# Patient Record
Sex: Female | Born: 1950
Health system: Southern US, Community
[De-identification: ages and names within clinical notes are randomized; demographics above are authoritative.]

## PROBLEM LIST (undated history)

## (undated) DIAGNOSIS — T7840XA Allergy, unspecified, initial encounter: Secondary | ICD-10-CM

## (undated) DIAGNOSIS — K219 Gastro-esophageal reflux disease without esophagitis: Secondary | ICD-10-CM

## (undated) DIAGNOSIS — F419 Anxiety disorder, unspecified: Secondary | ICD-10-CM

## (undated) DIAGNOSIS — G4730 Sleep apnea, unspecified: Secondary | ICD-10-CM

## (undated) DIAGNOSIS — M949 Disorder of cartilage, unspecified: Secondary | ICD-10-CM

## (undated) DIAGNOSIS — N318 Other neuromuscular dysfunction of bladder: Secondary | ICD-10-CM

## (undated) DIAGNOSIS — J45901 Unspecified asthma with (acute) exacerbation: Secondary | ICD-10-CM

## (undated) DIAGNOSIS — I10 Essential (primary) hypertension: Secondary | ICD-10-CM

## (undated) DIAGNOSIS — F319 Bipolar disorder, unspecified: Secondary | ICD-10-CM

## (undated) DIAGNOSIS — E785 Hyperlipidemia, unspecified: Secondary | ICD-10-CM

## (undated) DIAGNOSIS — M1990 Unspecified osteoarthritis, unspecified site: Secondary | ICD-10-CM

## (undated) DIAGNOSIS — D649 Anemia, unspecified: Secondary | ICD-10-CM

## (undated) DIAGNOSIS — M899 Disorder of bone, unspecified: Secondary | ICD-10-CM

## (undated) DIAGNOSIS — I509 Heart failure, unspecified: Secondary | ICD-10-CM

## (undated) DIAGNOSIS — G43009 Migraine without aura, not intractable, without status migrainosus: Secondary | ICD-10-CM

## (undated) HISTORY — PX: HERNIA REPAIR: SHX51

## (undated) HISTORY — PX: CARPAL TUNNEL RELEASE: SHX101

## (undated) HISTORY — PX: ABDOMINAL HYSTERECTOMY: SHX81

## (undated) HISTORY — DX: Migraine without aura, not intractable, without status migrainosus: G43.009

## (undated) HISTORY — DX: Unspecified osteoarthritis, unspecified site: M19.90

## (undated) HISTORY — DX: Anxiety disorder, unspecified: F41.9

## (undated) HISTORY — PX: EYE SURGERY: SHX253

## (undated) HISTORY — DX: Anemia, unspecified: D64.9

## (undated) HISTORY — DX: Disorder of bone, unspecified: M89.9

## (undated) HISTORY — DX: Gastro-esophageal reflux disease without esophagitis: K21.9

## (undated) HISTORY — DX: Bipolar disorder, unspecified: F31.9

## (undated) HISTORY — PX: CHOLECYSTECTOMY: SHX55

## (undated) HISTORY — DX: Disorder of cartilage, unspecified: M94.9

## (undated) HISTORY — PX: JOINT REPLACEMENT: SHX530

## (undated) HISTORY — DX: Hyperlipidemia, unspecified: E78.5

## (undated) HISTORY — DX: Unspecified asthma with (acute) exacerbation: J45.901

## (undated) HISTORY — PX: TUBAL LIGATION: SHX77

## (undated) HISTORY — DX: Other neuromuscular dysfunction of bladder: N31.8

## (undated) HISTORY — PX: COSMETIC SURGERY: SHX468

## (undated) HISTORY — PX: PAROTID ENDOSCOPY: SHX2164

## (undated) HISTORY — DX: Essential (primary) hypertension: I10

## (undated) HISTORY — DX: Heart failure, unspecified: I50.9

## (undated) HISTORY — DX: Allergy, unspecified, initial encounter: T78.40XA

## (undated) HISTORY — DX: Sleep apnea, unspecified: G47.30

## (undated) HISTORY — PX: SPINE SURGERY: SHX786

---

## 1978-01-10 HISTORY — PX: BREAST SURGERY: SHX581

## 1997-06-06 ENCOUNTER — Other Ambulatory Visit: Admission: RE | Admit: 1997-06-06 | Discharge: 1997-06-06 | Payer: Self-pay | Admitting: Orthopedic Surgery

## 1999-11-08 ENCOUNTER — Encounter: Payer: Self-pay | Admitting: Gynecology

## 1999-11-08 ENCOUNTER — Encounter: Admission: RE | Admit: 1999-11-08 | Discharge: 1999-11-08 | Payer: Self-pay | Admitting: Gynecology

## 1999-11-09 ENCOUNTER — Other Ambulatory Visit: Admission: RE | Admit: 1999-11-09 | Discharge: 1999-11-09 | Payer: Self-pay | Admitting: Gynecology

## 2000-10-09 ENCOUNTER — Other Ambulatory Visit: Admission: RE | Admit: 2000-10-09 | Discharge: 2000-10-09 | Payer: Self-pay | Admitting: Gynecology

## 2001-10-15 ENCOUNTER — Other Ambulatory Visit: Admission: RE | Admit: 2001-10-15 | Discharge: 2001-10-15 | Payer: Self-pay | Admitting: Gynecology

## 2001-12-18 ENCOUNTER — Other Ambulatory Visit: Admission: RE | Admit: 2001-12-18 | Discharge: 2001-12-18 | Payer: Self-pay | Admitting: Otolaryngology

## 2002-03-11 ENCOUNTER — Ambulatory Visit (HOSPITAL_BASED_OUTPATIENT_CLINIC_OR_DEPARTMENT_OTHER): Admission: RE | Admit: 2002-03-11 | Discharge: 2002-03-12 | Payer: Self-pay | Admitting: Otolaryngology

## 2002-07-02 ENCOUNTER — Encounter: Admission: RE | Admit: 2002-07-02 | Discharge: 2002-07-02 | Payer: Self-pay | Admitting: Gynecology

## 2002-07-02 ENCOUNTER — Encounter: Payer: Self-pay | Admitting: Gynecology

## 2003-05-05 ENCOUNTER — Emergency Department (HOSPITAL_COMMUNITY): Admission: EM | Admit: 2003-05-05 | Discharge: 2003-05-05 | Payer: Self-pay | Admitting: Emergency Medicine

## 2003-05-12 ENCOUNTER — Emergency Department (HOSPITAL_COMMUNITY): Admission: EM | Admit: 2003-05-12 | Discharge: 2003-05-12 | Payer: Self-pay | Admitting: Emergency Medicine

## 2003-07-30 ENCOUNTER — Inpatient Hospital Stay (HOSPITAL_COMMUNITY): Admission: RE | Admit: 2003-07-30 | Discharge: 2003-08-02 | Payer: Self-pay | Admitting: Psychiatry

## 2003-11-26 ENCOUNTER — Ambulatory Visit: Payer: Self-pay | Admitting: Gastroenterology

## 2003-12-15 ENCOUNTER — Ambulatory Visit: Payer: Self-pay | Admitting: Gastroenterology

## 2004-03-23 ENCOUNTER — Other Ambulatory Visit: Admission: RE | Admit: 2004-03-23 | Discharge: 2004-03-23 | Payer: Self-pay | Admitting: Gynecology

## 2004-06-04 ENCOUNTER — Encounter: Admission: RE | Admit: 2004-06-04 | Discharge: 2004-06-04 | Payer: Self-pay | Admitting: Gynecology

## 2004-12-26 ENCOUNTER — Emergency Department (HOSPITAL_COMMUNITY): Admission: EM | Admit: 2004-12-26 | Discharge: 2004-12-26 | Payer: Self-pay | Admitting: Emergency Medicine

## 2005-06-14 ENCOUNTER — Other Ambulatory Visit: Admission: RE | Admit: 2005-06-14 | Discharge: 2005-06-14 | Payer: Self-pay | Admitting: Gynecology

## 2005-08-16 ENCOUNTER — Encounter: Admission: RE | Admit: 2005-08-16 | Discharge: 2005-08-16 | Payer: Self-pay | Admitting: Gynecology

## 2006-07-31 ENCOUNTER — Other Ambulatory Visit: Admission: RE | Admit: 2006-07-31 | Discharge: 2006-07-31 | Payer: Self-pay | Admitting: Gynecology

## 2006-09-26 ENCOUNTER — Encounter: Admission: RE | Admit: 2006-09-26 | Discharge: 2006-09-26 | Payer: Self-pay | Admitting: Gynecology

## 2007-09-11 LAB — CONVERTED CEMR LAB

## 2007-09-19 ENCOUNTER — Encounter: Payer: Self-pay | Admitting: Internal Medicine

## 2007-10-29 ENCOUNTER — Ambulatory Visit: Payer: Self-pay | Admitting: Internal Medicine

## 2007-10-29 DIAGNOSIS — F319 Bipolar disorder, unspecified: Secondary | ICD-10-CM

## 2007-10-29 DIAGNOSIS — I10 Essential (primary) hypertension: Secondary | ICD-10-CM

## 2007-10-29 DIAGNOSIS — E785 Hyperlipidemia, unspecified: Secondary | ICD-10-CM

## 2007-10-29 HISTORY — DX: Bipolar disorder, unspecified: F31.9

## 2007-10-29 HISTORY — DX: Essential (primary) hypertension: I10

## 2007-10-29 HISTORY — DX: Hyperlipidemia, unspecified: E78.5

## 2007-10-30 DIAGNOSIS — M899 Disorder of bone, unspecified: Secondary | ICD-10-CM

## 2007-10-30 DIAGNOSIS — M949 Disorder of cartilage, unspecified: Secondary | ICD-10-CM

## 2007-10-30 HISTORY — DX: Disorder of bone, unspecified: M89.9

## 2007-11-02 ENCOUNTER — Encounter: Payer: Self-pay | Admitting: Internal Medicine

## 2007-11-02 LAB — CONVERTED CEMR LAB
ALT: 14 units/L (ref 0–35)
Albumin: 3.4 g/dL — ABNORMAL LOW (ref 3.5–5.2)
BUN: 9 mg/dL (ref 6–23)
Bacteria, UA: NEGATIVE
Basophils Relative: 2.8 % (ref 0.0–3.0)
Bilirubin Urine: NEGATIVE
CO2: 31 meq/L (ref 19–32)
Calcium: 8.9 mg/dL (ref 8.4–10.5)
Crystals: NEGATIVE
Eosinophils Absolute: 0.4 10*3/uL (ref 0.0–0.7)
Eosinophils Relative: 7.2 % — ABNORMAL HIGH (ref 0.0–5.0)
GFR calc Af Amer: 83 mL/min
Glucose, Bld: 95 mg/dL (ref 70–99)
HCT: 33.9 % — ABNORMAL LOW (ref 36.0–46.0)
HDL: 48.6 mg/dL (ref >39.0)
Hemoglobin: 12.2 g/dL (ref 12.0–15.0)
Ketones, ur: NEGATIVE mg/dL
MCV: 95.8 fL (ref 78.0–100.0)
Monocytes Absolute: 0.6 10*3/uL (ref 0.1–1.0)
Neutro Abs: 2.9 10*3/uL (ref 1.4–7.7)
Platelets: 145 10*3/uL — ABNORMAL LOW (ref 150–400)
TSH: 2.5 microintl units/mL (ref 0.35–5.50)
Total Protein: 5.9 g/dL — ABNORMAL LOW (ref 6.0–8.3)
Urine Glucose: NEGATIVE mg/dL
Urobilinogen, UA: 1 (ref 0.0–1.0)
WBC: 5.6 10*3/uL (ref 4.5–10.5)

## 2007-11-12 ENCOUNTER — Ambulatory Visit: Payer: Self-pay | Admitting: Internal Medicine

## 2007-11-12 DIAGNOSIS — D649 Anemia, unspecified: Secondary | ICD-10-CM

## 2007-11-12 HISTORY — DX: Anemia, unspecified: D64.9

## 2007-11-12 LAB — CONVERTED CEMR LAB
Crystals: NEGATIVE
Eosinophils Absolute: 0.4 10*3/uL (ref 0.0–0.7)
Eosinophils Relative: 6.1 % — ABNORMAL HIGH (ref 0.0–5.0)
Folate: 10.6 ng/mL
HCT: 32.4 % — ABNORMAL LOW (ref 36.0–46.0)
MCV: 96.7 fL (ref 78.0–100.0)
Monocytes Absolute: 0.8 10*3/uL (ref 0.1–1.0)
Neutrophils Relative %: 61.3 % (ref 43.0–77.0)
Nitrite: NEGATIVE
Platelets: 163 10*3/uL (ref 150–400)
RDW: 11.5 % (ref 11.5–14.6)
Saturation Ratios: 33 % (ref 20.0–50.0)
Specific Gravity, Urine: 1.025 (ref 1.000–1.03)
Urine Glucose: NEGATIVE mg/dL
Urobilinogen, UA: 1 (ref 0.0–1.0)
Vitamin B-12: 578 pg/mL (ref 211–911)
WBC: 7 10*3/uL (ref 4.5–10.5)

## 2007-11-13 ENCOUNTER — Encounter: Payer: Self-pay | Admitting: Internal Medicine

## 2007-11-19 ENCOUNTER — Ambulatory Visit (HOSPITAL_COMMUNITY): Admission: RE | Admit: 2007-11-19 | Discharge: 2007-11-19 | Payer: Self-pay | Admitting: Internal Medicine

## 2007-11-21 ENCOUNTER — Encounter: Payer: Self-pay | Admitting: Internal Medicine

## 2007-12-03 ENCOUNTER — Telehealth: Payer: Self-pay | Admitting: Internal Medicine

## 2007-12-03 ENCOUNTER — Encounter: Payer: Self-pay | Admitting: Internal Medicine

## 2007-12-10 ENCOUNTER — Telehealth: Payer: Self-pay | Admitting: Internal Medicine

## 2008-01-17 ENCOUNTER — Ambulatory Visit: Payer: Self-pay | Admitting: Internal Medicine

## 2008-02-12 ENCOUNTER — Encounter: Admission: RE | Admit: 2008-02-12 | Discharge: 2008-02-12 | Payer: Self-pay | Admitting: Sports Medicine

## 2008-02-21 ENCOUNTER — Ambulatory Visit: Payer: Self-pay | Admitting: Internal Medicine

## 2008-02-21 DIAGNOSIS — N318 Other neuromuscular dysfunction of bladder: Secondary | ICD-10-CM

## 2008-02-21 HISTORY — DX: Other neuromuscular dysfunction of bladder: N31.8

## 2008-02-28 ENCOUNTER — Encounter: Admission: RE | Admit: 2008-02-28 | Discharge: 2008-02-28 | Payer: Self-pay | Admitting: Sports Medicine

## 2008-03-03 ENCOUNTER — Ambulatory Visit: Payer: Self-pay | Admitting: Internal Medicine

## 2008-03-03 DIAGNOSIS — K5730 Diverticulosis of large intestine without perforation or abscess without bleeding: Secondary | ICD-10-CM | POA: Insufficient documentation

## 2008-08-27 ENCOUNTER — Ambulatory Visit: Payer: Self-pay | Admitting: Internal Medicine

## 2008-08-27 LAB — CONVERTED CEMR LAB
ALT: 13 units/L (ref 0–35)
Albumin: 3.8 g/dL (ref 3.5–5.2)
Alkaline Phosphatase: 44 units/L (ref 39–117)
Basophils Relative: 0.1 % (ref 0.0–3.0)
Bilirubin, Direct: 0.2 mg/dL (ref 0.0–0.3)
CO2: 28 meq/L (ref 19–32)
Chloride: 94 meq/L — ABNORMAL LOW (ref 96–112)
Creatinine, Ser: 0.7 mg/dL (ref 0.4–1.2)
Eosinophils Relative: 2 % (ref 0.0–5.0)
HDL goal, serum: 40 mg/dL
Hemoglobin: 11.6 g/dL — ABNORMAL LOW (ref 12.0–15.0)
LDL Goal: 130 mg/dL
MCV: 97.8 fL (ref 78.0–100.0)
Monocytes Absolute: 0.9 10*3/uL (ref 0.1–1.0)
Neutro Abs: 3.8 10*3/uL (ref 1.4–7.7)
Neutrophils Relative %: 60.7 % (ref 43.0–77.0)
Potassium: 4 meq/L (ref 3.5–5.1)
RBC: 3.43 M/uL — ABNORMAL LOW (ref 3.87–5.11)
Saturation Ratios: 21.4 % (ref 20.0–50.0)
Sodium: 128 meq/L — ABNORMAL LOW (ref 135–145)
Total Protein: 6.2 g/dL (ref 6.0–8.3)
Valproic Acid Lvl: 102.6 ug/mL — ABNORMAL HIGH (ref 50.0–100.0)
Vitamin B-12: 785 pg/mL (ref 211–911)
WBC: 6.2 10*3/uL (ref 4.5–10.5)

## 2008-08-28 ENCOUNTER — Encounter: Payer: Self-pay | Admitting: Internal Medicine

## 2008-08-28 ENCOUNTER — Telehealth: Payer: Self-pay | Admitting: Internal Medicine

## 2008-09-04 ENCOUNTER — Ambulatory Visit: Payer: Self-pay | Admitting: Internal Medicine

## 2008-09-04 DIAGNOSIS — E871 Hypo-osmolality and hyponatremia: Secondary | ICD-10-CM | POA: Insufficient documentation

## 2008-09-04 LAB — CONVERTED CEMR LAB
Basophils Relative: 0.2 % (ref 0.0–3.0)
Eosinophils Relative: 2.7 % (ref 0.0–5.0)
GFR calc non Af Amer: 77.91 mL/min (ref >60)
HCT: 35.7 % — ABNORMAL LOW (ref 36.0–46.0)
Hemoglobin: 12.2 g/dL (ref 12.0–15.0)
Lymphs Abs: 1.3 10*3/uL (ref 0.7–4.0)
Monocytes Relative: 12.7 % — ABNORMAL HIGH (ref 3.0–12.0)
Neutro Abs: 3.3 10*3/uL (ref 1.4–7.7)
Potassium: 4.5 meq/L (ref 3.5–5.1)
RBC: 3.64 M/uL — ABNORMAL LOW (ref 3.87–5.11)
Sodium: 135 meq/L (ref 135–145)
WBC: 5.4 10*3/uL (ref 4.5–10.5)

## 2008-09-08 ENCOUNTER — Encounter: Admission: RE | Admit: 2008-09-08 | Discharge: 2008-09-08 | Payer: Self-pay | Admitting: Internal Medicine

## 2008-09-30 ENCOUNTER — Ambulatory Visit: Payer: Self-pay | Admitting: Internal Medicine

## 2008-10-07 ENCOUNTER — Ambulatory Visit: Payer: Self-pay | Admitting: Internal Medicine

## 2008-11-03 ENCOUNTER — Encounter: Payer: Self-pay | Admitting: Internal Medicine

## 2008-11-10 ENCOUNTER — Ambulatory Visit: Payer: Self-pay | Admitting: Internal Medicine

## 2008-11-10 ENCOUNTER — Emergency Department (HOSPITAL_COMMUNITY): Admission: EM | Admit: 2008-11-10 | Discharge: 2008-11-10 | Payer: Self-pay | Admitting: Emergency Medicine

## 2008-11-26 ENCOUNTER — Telehealth: Payer: Self-pay | Admitting: Internal Medicine

## 2008-12-01 ENCOUNTER — Encounter: Payer: Self-pay | Admitting: Cardiovascular Disease

## 2008-12-01 ENCOUNTER — Encounter: Payer: Self-pay | Admitting: Internal Medicine

## 2008-12-02 ENCOUNTER — Encounter: Payer: Self-pay | Admitting: Internal Medicine

## 2008-12-11 ENCOUNTER — Ambulatory Visit: Payer: Self-pay | Admitting: Internal Medicine

## 2008-12-23 ENCOUNTER — Telehealth (INDEPENDENT_AMBULATORY_CARE_PROVIDER_SITE_OTHER): Payer: Self-pay | Admitting: *Deleted

## 2008-12-24 ENCOUNTER — Encounter (INDEPENDENT_AMBULATORY_CARE_PROVIDER_SITE_OTHER): Payer: Self-pay | Admitting: *Deleted

## 2008-12-26 ENCOUNTER — Telehealth (INDEPENDENT_AMBULATORY_CARE_PROVIDER_SITE_OTHER): Payer: Self-pay | Admitting: *Deleted

## 2008-12-26 ENCOUNTER — Ambulatory Visit: Payer: Self-pay | Admitting: Cardiovascular Disease

## 2008-12-29 ENCOUNTER — Ambulatory Visit: Payer: Self-pay | Admitting: Internal Medicine

## 2008-12-29 DIAGNOSIS — G43009 Migraine without aura, not intractable, without status migrainosus: Secondary | ICD-10-CM | POA: Insufficient documentation

## 2008-12-29 HISTORY — DX: Migraine without aura, not intractable, without status migrainosus: G43.009

## 2009-01-22 ENCOUNTER — Encounter: Payer: Self-pay | Admitting: Cardiovascular Disease

## 2009-01-22 ENCOUNTER — Ambulatory Visit: Payer: Self-pay

## 2009-01-22 ENCOUNTER — Ambulatory Visit: Payer: Self-pay | Admitting: Cardiovascular Disease

## 2009-01-22 ENCOUNTER — Ambulatory Visit (HOSPITAL_COMMUNITY): Admission: RE | Admit: 2009-01-22 | Discharge: 2009-01-22 | Payer: Self-pay | Admitting: Cardiovascular Disease

## 2009-01-22 ENCOUNTER — Ambulatory Visit: Payer: Self-pay | Admitting: Cardiology

## 2009-02-02 ENCOUNTER — Ambulatory Visit: Payer: Self-pay | Admitting: Cardiovascular Disease

## 2009-02-18 ENCOUNTER — Encounter: Admission: RE | Admit: 2009-02-18 | Discharge: 2009-02-18 | Payer: Self-pay | Admitting: Gynecology

## 2009-03-30 ENCOUNTER — Ambulatory Visit: Payer: Self-pay | Admitting: Internal Medicine

## 2009-04-23 ENCOUNTER — Ambulatory Visit: Payer: Self-pay | Admitting: Internal Medicine

## 2009-05-08 ENCOUNTER — Ambulatory Visit: Payer: Self-pay | Admitting: Internal Medicine

## 2009-05-12 ENCOUNTER — Ambulatory Visit: Payer: Self-pay | Admitting: Internal Medicine

## 2009-05-20 ENCOUNTER — Encounter: Payer: Self-pay | Admitting: Internal Medicine

## 2009-05-20 LAB — CONVERTED CEMR LAB
Albumin: 4.2 g/dL (ref 3.5–5.2)
Alkaline Phosphatase: 53 units/L (ref 39–117)
BUN: 19 mg/dL (ref 6–23)
Basophils Absolute: 0.1 10*3/uL (ref 0.0–0.1)
CO2: 28 meq/L (ref 19–32)
Calcium: 9.4 mg/dL (ref 8.4–10.5)
Creatinine, Ser: 0.7 mg/dL (ref 0.4–1.2)
Eosinophils Absolute: 0.2 10*3/uL (ref 0.0–0.7)
Glucose, Bld: 91 mg/dL (ref 70–99)
Hemoglobin: 12.4 g/dL (ref 12.0–15.0)
Lymphocytes Relative: 22.5 % (ref 12.0–46.0)
MCHC: 34.7 g/dL (ref 30.0–36.0)
Neutro Abs: 4.5 10*3/uL (ref 1.4–7.7)
RDW: 12.9 % (ref 11.5–14.6)
Sed Rate: 20 mm/hr (ref 0–22)
Sodium: 136 meq/L (ref 135–145)

## 2009-05-21 ENCOUNTER — Encounter: Payer: Self-pay | Admitting: Internal Medicine

## 2009-06-01 ENCOUNTER — Encounter: Payer: Self-pay | Admitting: Internal Medicine

## 2009-07-08 ENCOUNTER — Encounter: Payer: Self-pay | Admitting: Cardiovascular Disease

## 2009-07-27 ENCOUNTER — Telehealth: Payer: Self-pay | Admitting: Cardiovascular Disease

## 2009-07-28 ENCOUNTER — Ambulatory Visit: Payer: Self-pay | Admitting: Internal Medicine

## 2009-07-28 DIAGNOSIS — K219 Gastro-esophageal reflux disease without esophagitis: Secondary | ICD-10-CM

## 2009-07-28 HISTORY — DX: Gastro-esophageal reflux disease without esophagitis: K21.9

## 2009-10-09 ENCOUNTER — Ambulatory Visit: Payer: Self-pay | Admitting: Internal Medicine

## 2009-10-09 LAB — CONVERTED CEMR LAB
ALT: 17 units/L (ref 0–35)
BUN: 17 mg/dL (ref 6–23)
Basophils Absolute: 0 10*3/uL (ref 0.0–0.1)
Bilirubin Urine: NEGATIVE
Bilirubin, Direct: 0.2 mg/dL (ref 0.0–0.3)
Creatinine, Ser: 0.7 mg/dL (ref 0.4–1.2)
Eosinophils Relative: 1.9 % (ref 0.0–5.0)
GFR calc non Af Amer: 93.64 mL/min (ref >60)
Leukocytes, UA: NEGATIVE
MCV: 94.7 fL (ref 78.0–100.0)
Monocytes Absolute: 1.1 10*3/uL — ABNORMAL HIGH (ref 0.1–1.0)
Monocytes Relative: 15.6 % — ABNORMAL HIGH (ref 3.0–12.0)
Neutrophils Relative %: 63.6 % (ref 43.0–77.0)
Nitrite: NEGATIVE
Platelets: 206 10*3/uL (ref 150.0–400.0)
RDW: 12.8 % (ref 11.5–14.6)
Specific Gravity, Urine: 1.02 (ref 1.000–1.030)
Total Bilirubin: 1 mg/dL (ref 0.3–1.2)
Total Protein, Urine: NEGATIVE mg/dL
WBC: 6.7 10*3/uL (ref 4.5–10.5)
pH: 6.5 (ref 5.0–8.0)

## 2009-10-14 ENCOUNTER — Encounter: Payer: Self-pay | Admitting: Internal Medicine

## 2009-10-19 ENCOUNTER — Encounter: Payer: Self-pay | Admitting: Internal Medicine

## 2009-10-23 ENCOUNTER — Ambulatory Visit: Payer: Self-pay | Admitting: Internal Medicine

## 2009-10-23 LAB — CONVERTED CEMR LAB
Bilirubin Urine: NEGATIVE
Ketones, ur: NEGATIVE mg/dL
Leukocytes, UA: NEGATIVE
Specific Gravity, Urine: 1.01 (ref 1.000–1.030)
Urine Glucose: NEGATIVE mg/dL
Urobilinogen, UA: 0.2 (ref 0.0–1.0)

## 2009-11-12 ENCOUNTER — Ambulatory Visit (HOSPITAL_COMMUNITY): Admission: RE | Admit: 2009-11-12 | Discharge: 2009-11-12 | Payer: Self-pay | Admitting: Gastroenterology

## 2009-11-13 ENCOUNTER — Telehealth: Payer: Self-pay | Admitting: Internal Medicine

## 2009-11-16 ENCOUNTER — Telehealth (INDEPENDENT_AMBULATORY_CARE_PROVIDER_SITE_OTHER): Payer: Self-pay | Admitting: *Deleted

## 2009-12-31 ENCOUNTER — Ambulatory Visit: Payer: Self-pay | Admitting: Internal Medicine

## 2009-12-31 ENCOUNTER — Encounter: Payer: Self-pay | Admitting: Internal Medicine

## 2009-12-31 DIAGNOSIS — G4730 Sleep apnea, unspecified: Secondary | ICD-10-CM

## 2009-12-31 HISTORY — DX: Sleep apnea, unspecified: G47.30

## 2009-12-31 LAB — CONVERTED CEMR LAB
Alkaline Phosphatase: 44 units/L (ref 39–117)
BUN: 16 mg/dL (ref 6–23)
Basophils Absolute: 0.1 10*3/uL (ref 0.0–0.1)
Bilirubin, Direct: 0.1 mg/dL (ref 0.0–0.3)
CO2: 27 meq/L (ref 19–32)
Calcium: 9.1 mg/dL (ref 8.4–10.5)
Creatinine, Ser: 0.7 mg/dL (ref 0.4–1.2)
Eosinophils Absolute: 0.4 10*3/uL (ref 0.0–0.7)
Glucose, Bld: 84 mg/dL (ref 70–99)
Lymphocytes Relative: 15 % (ref 12.0–46.0)
MCHC: 35.5 g/dL (ref 30.0–36.0)
MCV: 97 fL (ref 78.0–100.0)
Monocytes Absolute: 1.1 10*3/uL — ABNORMAL HIGH (ref 0.1–1.0)
Neutrophils Relative %: 59.1 % (ref 43.0–77.0)
Platelets: 160 10*3/uL (ref 150.0–400.0)
Pro B Natriuretic peptide (BNP): 253.7 pg/mL — ABNORMAL HIGH (ref 0.0–100.0)
RBC: 3.69 M/uL — ABNORMAL LOW (ref 3.87–5.11)
RDW: 12.9 % (ref 11.5–14.6)
Total Bilirubin: 0.5 mg/dL (ref 0.3–1.2)
Valproic Acid Lvl: 111.2 ug/mL — ABNORMAL HIGH (ref 50.0–100.0)
Vit D, 25-Hydroxy: 43 ng/mL (ref 30–89)

## 2010-01-01 ENCOUNTER — Encounter: Payer: Self-pay | Admitting: Internal Medicine

## 2010-01-06 ENCOUNTER — Ambulatory Visit: Payer: Self-pay | Admitting: Pulmonary Disease

## 2010-01-07 ENCOUNTER — Telehealth: Payer: Self-pay | Admitting: Internal Medicine

## 2010-01-08 ENCOUNTER — Telehealth: Payer: Self-pay | Admitting: Internal Medicine

## 2010-01-12 ENCOUNTER — Telehealth: Payer: Self-pay | Admitting: Internal Medicine

## 2010-01-13 ENCOUNTER — Ambulatory Visit
Admission: RE | Admit: 2010-01-13 | Discharge: 2010-01-13 | Payer: Self-pay | Source: Home / Self Care | Attending: Internal Medicine | Admitting: Internal Medicine

## 2010-01-13 DIAGNOSIS — J45901 Unspecified asthma with (acute) exacerbation: Secondary | ICD-10-CM

## 2010-01-13 HISTORY — DX: Unspecified asthma with (acute) exacerbation: J45.901

## 2010-01-20 ENCOUNTER — Encounter: Payer: Self-pay | Admitting: Cardiovascular Disease

## 2010-01-20 ENCOUNTER — Telehealth: Payer: Self-pay | Admitting: Internal Medicine

## 2010-01-22 ENCOUNTER — Encounter (INDEPENDENT_AMBULATORY_CARE_PROVIDER_SITE_OTHER): Payer: Self-pay | Admitting: *Deleted

## 2010-01-26 ENCOUNTER — Ambulatory Visit (HOSPITAL_BASED_OUTPATIENT_CLINIC_OR_DEPARTMENT_OTHER): Admission: RE | Admit: 2010-01-26 | Payer: Self-pay | Source: Home / Self Care | Admitting: Pulmonary Disease

## 2010-01-27 ENCOUNTER — Emergency Department (HOSPITAL_COMMUNITY)
Admission: EM | Admit: 2010-01-27 | Discharge: 2010-01-28 | Payer: Self-pay | Source: Home / Self Care | Admitting: Emergency Medicine

## 2010-01-28 ENCOUNTER — Ambulatory Visit (HOSPITAL_COMMUNITY): Admission: RE | Admit: 2010-01-28 | Payer: Self-pay | Source: Home / Self Care | Admitting: Cardiovascular Disease

## 2010-01-29 ENCOUNTER — Telehealth: Payer: Self-pay | Admitting: Internal Medicine

## 2010-02-01 ENCOUNTER — Inpatient Hospital Stay (HOSPITAL_COMMUNITY)
Admission: EM | Admit: 2010-02-01 | Discharge: 2010-02-11 | DRG: 885 | Disposition: A | Discharge status: Other Acute Inpatient Hospital | Payer: 59 | Attending: Internal Medicine | Admitting: Internal Medicine

## 2010-02-01 DIAGNOSIS — D696 Thrombocytopenia, unspecified: Secondary | ICD-10-CM | POA: Diagnosis present

## 2010-02-01 DIAGNOSIS — F319 Bipolar disorder, unspecified: Principal | ICD-10-CM | POA: Diagnosis present

## 2010-02-01 DIAGNOSIS — N318 Other neuromuscular dysfunction of bladder: Secondary | ICD-10-CM | POA: Diagnosis present

## 2010-02-01 DIAGNOSIS — M899 Disorder of bone, unspecified: Secondary | ICD-10-CM | POA: Diagnosis present

## 2010-02-01 DIAGNOSIS — G43909 Migraine, unspecified, not intractable, without status migrainosus: Secondary | ICD-10-CM | POA: Diagnosis present

## 2010-02-01 DIAGNOSIS — K5730 Diverticulosis of large intestine without perforation or abscess without bleeding: Secondary | ICD-10-CM | POA: Diagnosis present

## 2010-02-01 DIAGNOSIS — F411 Generalized anxiety disorder: Secondary | ICD-10-CM | POA: Diagnosis present

## 2010-02-01 DIAGNOSIS — E875 Hyperkalemia: Secondary | ICD-10-CM | POA: Diagnosis present

## 2010-02-01 DIAGNOSIS — I10 Essential (primary) hypertension: Secondary | ICD-10-CM | POA: Diagnosis present

## 2010-02-01 DIAGNOSIS — E785 Hyperlipidemia, unspecified: Secondary | ICD-10-CM | POA: Diagnosis present

## 2010-02-01 LAB — URINALYSIS, ROUTINE W REFLEX MICROSCOPIC
Bilirubin Urine: NEGATIVE
Hgb urine dipstick: NEGATIVE
Specific Gravity, Urine: 1.014 (ref 1.005–1.030)
Urobilinogen, UA: 0.2 mg/dL (ref 0.0–1.0)

## 2010-02-01 LAB — CBC
HCT: 33.3 % — ABNORMAL LOW (ref 36.0–46.0)
MCH: 32.2 pg (ref 26.0–34.0)
MCHC: 33.9 g/dL (ref 30.0–36.0)
MCV: 94.9 fL (ref 78.0–100.0)
RDW: 12.9 % (ref 11.5–15.5)

## 2010-02-01 LAB — DIFFERENTIAL
Eosinophils Relative: 7 % — ABNORMAL HIGH (ref 0–5)
Lymphocytes Relative: 37 % (ref 12–46)
Lymphs Abs: 1.8 10*3/uL (ref 0.7–4.0)
Monocytes Absolute: 0.8 10*3/uL (ref 0.1–1.0)

## 2010-02-01 LAB — POCT I-STAT, CHEM 8
Calcium, Ion: 1.11 mmol/L — ABNORMAL LOW (ref 1.12–1.32)
Chloride: 111 mEq/L (ref 96–112)
Glucose, Bld: 79 mg/dL (ref 70–99)
HCT: 32 % — ABNORMAL LOW (ref 36.0–46.0)
Hemoglobin: 10.9 g/dL — ABNORMAL LOW (ref 12.0–15.0)

## 2010-02-02 LAB — RAPID URINE DRUG SCREEN, HOSP PERFORMED
Barbiturates: NOT DETECTED
Cocaine: NOT DETECTED
Opiates: NOT DETECTED

## 2010-02-02 LAB — URINALYSIS, ROUTINE W REFLEX MICROSCOPIC
Bilirubin Urine: NEGATIVE
Hgb urine dipstick: NEGATIVE
Ketones, ur: 15 mg/dL — AB
Protein, ur: 30 mg/dL — AB
Urine Glucose, Fasting: NEGATIVE mg/dL

## 2010-02-02 LAB — DIFFERENTIAL
Basophils Relative: 0 % (ref 0–1)
Monocytes Absolute: 0.8 10*3/uL (ref 0.1–1.0)
Monocytes Relative: 8 % (ref 3–12)
Neutro Abs: 7.2 10*3/uL (ref 1.7–7.7)

## 2010-02-02 LAB — COMPREHENSIVE METABOLIC PANEL
ALT: 14 U/L (ref 0–35)
Albumin: 3.2 g/dL — ABNORMAL LOW (ref 3.5–5.2)
Alkaline Phosphatase: 40 U/L (ref 39–117)
BUN: 15 mg/dL (ref 6–23)
Chloride: 108 mEq/L (ref 96–112)
Glucose, Bld: 126 mg/dL — ABNORMAL HIGH (ref 70–99)
Potassium: 5.3 mEq/L — ABNORMAL HIGH (ref 3.5–5.1)
Sodium: 141 mEq/L (ref 135–145)
Total Bilirubin: 0.3 mg/dL (ref 0.3–1.2)

## 2010-02-02 LAB — CBC
HCT: 34.6 % — ABNORMAL LOW (ref 36.0–46.0)
Hemoglobin: 12 g/dL (ref 12.0–15.0)
MCH: 32.7 pg (ref 26.0–34.0)
MCHC: 34.7 g/dL (ref 30.0–36.0)
MCV: 94.3 fL (ref 78.0–100.0)

## 2010-02-02 LAB — LIPASE, BLOOD: Lipase: 22 U/L (ref 11–59)

## 2010-02-02 LAB — ETHANOL: Alcohol, Ethyl (B): <5 mg/dL (ref 0–10)

## 2010-02-02 LAB — PROTIME-INR
INR: 1.04 (ref 0.00–1.49)
Prothrombin Time: 13.8 seconds (ref 11.6–15.2)

## 2010-02-03 ENCOUNTER — Ambulatory Visit: Admit: 2010-02-03 | Payer: Self-pay | Admitting: Cardiovascular Disease

## 2010-02-03 LAB — RPR: RPR Ser Ql: NONREACTIVE

## 2010-02-03 LAB — BASIC METABOLIC PANEL
GFR calc Af Amer: >60 mL/min (ref >60)
GFR calc non Af Amer: >60 mL/min (ref >60)
Glucose, Bld: 105 mg/dL — ABNORMAL HIGH (ref 70–99)
Potassium: 3.9 mEq/L (ref 3.5–5.1)
Sodium: 140 mEq/L (ref 135–145)

## 2010-02-03 LAB — COMPREHENSIVE METABOLIC PANEL
AST: 15 U/L (ref 0–37)
Albumin: 3 g/dL — ABNORMAL LOW (ref 3.5–5.2)
Calcium: 8.8 mg/dL (ref 8.4–10.5)
Creatinine, Ser: 0.77 mg/dL (ref 0.4–1.2)
GFR calc Af Amer: >60 mL/min (ref >60)
Total Protein: 5.4 g/dL — ABNORMAL LOW (ref 6.0–8.3)

## 2010-02-03 LAB — DIFFERENTIAL
Basophils Absolute: 0 10*3/uL (ref 0.0–0.1)
Eosinophils Relative: 4 % (ref 0–5)
Lymphocytes Relative: 36 % (ref 12–46)
Neutro Abs: 1.9 10*3/uL (ref 1.7–7.7)

## 2010-02-03 LAB — VALPROIC ACID LEVEL: Valproic Acid Lvl: 111.8 ug/mL — ABNORMAL HIGH (ref 50.0–100.0)

## 2010-02-03 LAB — IRON AND TIBC
Iron: 56 ug/dL (ref 42–135)
Saturation Ratios: 15 % — ABNORMAL LOW (ref 20–55)
UIBC: 307 ug/dL

## 2010-02-03 LAB — CARDIAC PANEL(CRET KIN+CKTOT+MB+TROPI)
CK, MB: 1.3 ng/mL (ref 0.3–4.0)
Relative Index: INVALID (ref 0.0–2.5)
Total CK: 56 U/L (ref 7–177)
Troponin I: <0.01 ng/mL (ref 0.00–0.06)

## 2010-02-03 LAB — CK TOTAL AND CKMB (NOT AT ARMC)
CK, MB: 1.8 ng/mL (ref 0.3–4.0)
Relative Index: INVALID (ref 0.0–2.5)
Total CK: 65 U/L (ref 7–177)

## 2010-02-03 LAB — CBC
HCT: 34.6 % — ABNORMAL LOW (ref 36.0–46.0)
MCHC: 33.3 g/dL (ref 30.0–36.0)
RDW: 12.9 % (ref 11.5–15.5)
RDW: 12.9 % (ref 11.5–15.5)
WBC: 4.5 10*3/uL (ref 4.0–10.5)

## 2010-02-03 LAB — LIPID PANEL
Cholesterol: 146 mg/dL (ref 0–200)
HDL: 52 mg/dL (ref >39)
LDL Cholesterol: 75 mg/dL (ref 0–99)
Total CHOL/HDL Ratio: 2.8 RATIO

## 2010-02-03 LAB — AMMONIA: Ammonia: 32 umol/L (ref 11–35)

## 2010-02-03 LAB — VITAMIN B12: Vitamin B-12: 675 pg/mL (ref 211–911)

## 2010-02-03 LAB — HAPTOGLOBIN: Haptoglobin: 53 mg/dL (ref 16–200)

## 2010-02-03 LAB — LACTATE DEHYDROGENASE: LDH: 162 U/L (ref 94–250)

## 2010-02-04 DIAGNOSIS — F39 Unspecified mood [affective] disorder: Secondary | ICD-10-CM

## 2010-02-04 LAB — BASIC METABOLIC PANEL
BUN: 13 mg/dL (ref 6–23)
Calcium: 9 mg/dL (ref 8.4–10.5)
Creatinine, Ser: 0.8 mg/dL (ref 0.4–1.2)
GFR calc non Af Amer: >60 mL/min (ref >60)

## 2010-02-04 LAB — CBC
MCH: 32.5 pg (ref 26.0–34.0)
MCV: 94.2 fL (ref 78.0–100.0)
Platelets: 98 10*3/uL — ABNORMAL LOW (ref 150–400)
RDW: 12.7 % (ref 11.5–15.5)
WBC: 4 10*3/uL (ref 4.0–10.5)

## 2010-02-05 LAB — CBC
MCV: 94.9 fL (ref 78.0–100.0)
Platelets: 97 10*3/uL — ABNORMAL LOW (ref 150–400)
RDW: 12.5 % (ref 11.5–15.5)
WBC: 6.2 10*3/uL (ref 4.0–10.5)

## 2010-02-05 LAB — BASIC METABOLIC PANEL
BUN: 9 mg/dL (ref 6–23)
GFR calc non Af Amer: >60 mL/min (ref >60)
Potassium: 4.5 mEq/L (ref 3.5–5.1)
Sodium: 144 mEq/L (ref 135–145)

## 2010-02-05 LAB — DIFFERENTIAL
Eosinophils Absolute: 0.2 10*3/uL (ref 0.0–0.7)
Monocytes Absolute: 1.2 10*3/uL — ABNORMAL HIGH (ref 0.1–1.0)
Neutrophils Relative %: 59 % (ref 43–77)

## 2010-02-06 DIAGNOSIS — F319 Bipolar disorder, unspecified: Secondary | ICD-10-CM

## 2010-02-06 LAB — CBC
HCT: 37 % (ref 36.0–46.0)
Hemoglobin: 12.9 g/dL (ref 12.0–15.0)
MCH: 32.5 pg (ref 26.0–34.0)
MCV: 93.2 fL (ref 78.0–100.0)
Platelets: 86 10*3/uL — ABNORMAL LOW (ref 150–400)
RBC: 3.97 MIL/uL (ref 3.87–5.11)
WBC: 6.9 10*3/uL (ref 4.0–10.5)

## 2010-02-07 DIAGNOSIS — F319 Bipolar disorder, unspecified: Secondary | ICD-10-CM

## 2010-02-07 LAB — CBC
HCT: 35.3 % — ABNORMAL LOW (ref 36.0–46.0)
Hemoglobin: 12.1 g/dL (ref 12.0–15.0)
MCHC: 34.3 g/dL (ref 30.0–36.0)
MCV: 93.1 fL (ref 78.0–100.0)
RDW: 12.5 % (ref 11.5–15.5)

## 2010-02-07 LAB — CONVERTED CEMR LAB
ALT: 17 units/L (ref 0–35)
Alkaline Phosphatase: 47 units/L (ref 39–117)
Basophils Relative: 0.5 % (ref 0.0–3.0)
Bilirubin, Direct: 0.2 mg/dL (ref 0.0–0.3)
Eosinophils Relative: 1.5 % (ref 0.0–5.0)
Lymphocytes Relative: 17.1 % (ref 12.0–46.0)
MCV: 95.6 fL (ref 78.0–100.0)
Monocytes Relative: 10.8 % (ref 3.0–12.0)
Neutrophils Relative %: 70.1 % (ref 43.0–77.0)
RBC: 3.78 M/uL — ABNORMAL LOW (ref 3.87–5.11)
Total Protein: 6.6 g/dL (ref 6.0–8.3)
WBC: 8.4 10*3/uL (ref 4.5–10.5)

## 2010-02-07 LAB — BASIC METABOLIC PANEL
BUN: 8 mg/dL (ref 6–23)
CO2: 26 mEq/L (ref 19–32)
Calcium: 9.3 mg/dL (ref 8.4–10.5)
GFR calc non Af Amer: >60 mL/min (ref >60)
Glucose, Bld: 94 mg/dL (ref 70–99)
Potassium: 4.3 mEq/L (ref 3.5–5.1)
Sodium: 141 mEq/L (ref 135–145)

## 2010-02-08 LAB — CBC
HCT: 35.4 % — ABNORMAL LOW (ref 36.0–46.0)
Hemoglobin: 12.2 g/dL (ref 12.0–15.0)
MCH: 31.8 pg (ref 26.0–34.0)
MCHC: 34.5 g/dL (ref 30.0–36.0)
RDW: 12.5 % (ref 11.5–15.5)

## 2010-02-08 LAB — BASIC METABOLIC PANEL
CO2: 28 mEq/L (ref 19–32)
Calcium: 9.6 mg/dL (ref 8.4–10.5)
Creatinine, Ser: 0.68 mg/dL (ref 0.4–1.2)
GFR calc non Af Amer: >60 mL/min (ref >60)
Glucose, Bld: 103 mg/dL — ABNORMAL HIGH (ref 70–99)

## 2010-02-08 NOTE — Consult Note (Signed)
Donna Sexton, Donna Sexton               ACCOUNT NO.:  [id number]  MEDICAL RECORD NO.:  [id number]          PATIENT TYPE:  INP  LOCATION:  4738                         FACILITY:  [facility]  PHYSICIAN:  [facility]      DATE OF BIRTH:  [birth year]  DATE OF CONSULTATION:  02/04/2010 DATE OF DISCHARGE:                                CONSULTATION   REASON FOR CONSULTATION:  Altered mental status and history of bipolar disorder.  HISTORY OF PRESENT ILLNESS:  A 60 year old white female who was found confused.  She was in the bank and she was brought to the hospital by ambulance.  At the time of admission, her CT scan and labs within normal limits.  When I saw the patient, the patient was still confused.  She told me she fell in the garage and bumped her head.  She is followed by Dr. Meredith Staggers in the outpatient setting for bipolar disorder, but she do not remember her psych medications at this time.  She told me she is not sleeping good for the last few weeks and her memory is also getting poor.  The patient was unable to provide any logical history during the interview.  The patient do not remember that how she ends up in the hospital.  The patient denied hearing any voices.  Denied any suicidal ideations at this time.  By checking her past record, the patient is on Depakote ER 500 mg 3 times a day, Celexa 20 mg p.o. daily, and Ativan 0.5 mg 3 times a day.  In the past, she has been admitted to behavioral health in 2005.  At that time, she was started on Geodon and Depakote, and she responded well to this combination.  MEDICAL HISTORY: 1. History of hypertension. 2. Hyperlipidemia. 3. Osteopenia. 4. Overactive bladder. 5. Migraines. 6. Chronic diverticulitis. 7. History of hyponatremia, on Depakote.  ALLERGIES:  NO KNOWN DRUG ALLERGIES.  SOCIAL HISTORY:  The patient lives by herself, not working.  She is retired.  She is on disability for bipolar disorder.  MENTAL STATUS  EXAM:  The patient is calm and cooperative during the interview.  Hygiene and grooming fair.  Psychomotor retardation positive.  Mood euthymic.  Affect and mood congruent.  Thought process unable to provide any logical history at this time.  Thought content, not suicidal or homicidal, not delusional.  Thought perception, no audiovisual hallucinations reported, not internally preoccupied. Cognition alert, awake, and oriented x3.  Memory immediate, recent, remote, poor.  Attention and concentration poor.  Abstraction ability poor.  Fund of knowledge poor.  Insight and judgment fair.  LABORATORY DATA:  BMP within normal limits.  CBC within normal limits. Valproic acid 76.3.  Lipid profile within normal limits.  Vitamin B12 of 520 within normal limits.  TSH 0.9.  Valproic acid on February 02, 2010, of 1.8 and magnesium 2.0.  Ammonia level 32, within normal limits. Tegretol level less than 0.3.  Urine drug screen positive for benzos. The patient is on Ativan.  Urinalysis within normal limits.  RADIOLOGY:  MRI of the brain, no acute intracranial abnormality or significant interval change.  CT  head, there is no evidence of acute pain infarct, hemorrhage, or mass.  Skull appears intact.  Chest x-ray, left base atelectasis.  DIAGNOSES:  Axis I:  As per history, bipolar disorder. Axis II:  Deferred. Axis III:  See medical notes. Axis IV:  Unspecified. Axis V:  40 to 50.  RECOMMENDATIONS: 1. I will not start any medication at this time until the patient is     medically cleared.  The patient should not be given even the     Depakote at this time. 2. A social worker should call Dr. Iona Hansen Cottle's office, the     psychiatrist in the outpatient setting to get the patient's     medications. 3. Social worker should also call the family member to get more     collateral information. 4. I will follow up on this patient.     Eulogio Ditch, MD   ______________________________ [facility]    SA/MEDQ  D:  02/04/2010  T:  02/04/2010  Job:  [id number]  Electronically Signed by Eulogio Ditch  on 02/08/2010 06:28:17 PM

## 2010-02-09 NOTE — Progress Notes (Signed)
Summary: Chest pains  Phone Note Call from Patient Call back at Home Phone [phone]   Caller: Patient Summary of Call: Pt having pains in chest Initial call taken by: Judie Grieve,  July 27, 2009 4:37 PM  Follow-up for Phone Call        PT C/O CHEST PAIN OFF/ON SINCE THURS 07/23/09 CAN TAKE BAKING SODA  WITH WATER AND PAIN GOES AWAY  BUT THEN HAS DIARRHEA.INFORMED TO CALL PMD FOR APPT PER PT HAS APPT ON WED WITH DR Yetta Barre AND WAS TOLD TO CALL IN AM TO SEE IF THERE MAY BE CANCELLATION. INFORMED IF S/S INCREASE TO GO TO ER. VERBALIZED UNDERSTANDING. Follow-up by: Scherrie Bateman, LPN,  July 27, 2009 5:00 PM  Additional Follow-up for Phone Call Additional follow up Details #1::        chart reviewed. has been evaluated by Dr Felicity Coyer. Additional Follow-up by: Norva Karvonen, MD,  July 29, 2009 11:11 PM

## 2010-02-09 NOTE — Assessment & Plan Note (Signed)
Summary: discuss meds/cd   Vital Signs:  Patient profile:   60 year old female Menstrual status:  hysterectomy Height:      65 inches Weight:      185 pounds BMI:     30.90 O2 Sat:      96 % on Room air Temp:     98.4 degrees F oral Pulse rate:   70 / minute Pulse rhythm:   regular Resp:     16 per minute BP sitting:   112 / 70  (left arm) Cuff size:   large  Vitals Entered By: Rock Nephew CMA (October 23, 2009 11:08 AM)  Nutrition Counseling: Patient's BMI is greater than 25 and therefore counseled on weight management options.  O2 Flow:  Room air CC: follow-up visit//med refill, flu shot, lab orders, Abdominal Pain     Menstrual Status hysterectomy Last PAP Result done   Primary Care Provider:  Etta Grandchild MD  CC:  follow-up visit//med refill, flu shot, lab orders, and Abdominal Pain.  History of Present Illness: She returns for f/up and she informs me that she drinks 6-10 12-ounce glasses of water each day.  She says thet feels a compulsion to drink water all day long. She has also been told that she has SIADH. She had moderate hematuria on a recent UA and she wants that to be rechecked today.  Dyspepsia History:      She has no alarm features of dyspepsia including no history of melena, hematochezia, dysphagia, persistent vomiting, or involuntary weight loss > 5%.  There is a prior history of GERD.  The patient does not have a prior history of documented ulcer disease.  The dominant symptom is heartburn or acid reflux.  An H-2 blocker medication is currently being taken.  She notes that the symptoms have improved with the H-2 blocker therapy.  Symptoms have not persisted after 4 weeks of H-2 blocker treatment.    Current Medications (verified): 1)  Labetalol Hcl 200 Mg Tabs (Labetalol Hcl) .... Take 1 Tablet By Mouth Two Times A Day 2)  Valproic Acid 250 Mg Caps (Valproic Acid) .... 6  A Day 3)  Citalopram Hydrobromide 20 Mg Tabs (Citalopram Hydrobromide) ....  Take 1 Tablet By Mouth Once A Day 4)  Simvastatin 80 Mg Tabs (Simvastatin) .... Take 1 Tablet By Mouth Once A Day 5)  Citracal Plus  Tabs (Multiple Minerals-Vitamins) .... Once Daily 6)  Omega-3 Fish Oil 1000 Mg Caps (Omega-3 Fatty Acids) .... Two Caps A Day 7)  Cinnamon 500 Mg Caps (Cinnamon) .... Two Caps A Day 8)  Lorazepam 0.5 Mg Tabs (Lorazepam) .... Take 1 Tablet By Mouth Three Times A Day and 4qhs 9)  Hydroxyzine Hcl 10 Mg/6ml Syrp (Hydroxyzine Hcl) .... 5-10 Ml By Mouth Qid As Needed For Itching 10)  Nexium 40 Mg Cpdr (Esomeprazole Magnesium) .Marland Kitchen.. 1 By Mouth Once Daily 11)  Promethazine Hcl 25 Mg Tabs (Promethazine Hcl) .Marland Kitchen.. 1 By Mouth Every 4 Hours As Needed For Nausea  Allergies (verified): 1)  ! Morphine  Past History:  Past Medical History: Last updated: 10/09/2009 HYPERTENSION, BENIGN ESSENTIAL (ICD-401.1) HYPERLIPIDEMIA (ICD-272.4) DIVERTICULOSIS, COLON (ICD-562.10) OVERACTIVE BLADDER (ICD-596.51) ANEMIA (ICD-285.9) OSTEOPENIA (ICD-733.90) HEADACHE (ICD-784.0) BIPOLAR DISORDER UNSPECIFIED (ICD-296.80)  GI - [facility] (not going back) neuro - [facility] (not going back)  Past Surgical History: Last updated: 12/18/2008 Hysterectomy  Family History: Last updated: 12/18/2008 Family History of CAD Female 1st degree relative <50 Family History Diabetes 1st degree relative Family History Kidney  disease Family History of Cardiovascular disorder  Social History: Last updated: 12/18/2008 Divorced Never Smoked Alcohol use-no Drug use-no Regular exercise-yes  Risk Factors: Alcohol Use: 0 (05/12/2009) Exercise: yes (10/29/2007)  Risk Factors: Smoking Status: never (05/12/2009) Passive Smoke Exposure: no (05/12/2009)  Family History: Reviewed history from 12/18/2008 and no changes required. Family History of CAD Female 1st degree relative <50 Family History Diabetes 1st degree relative Family History Kidney disease Family History of Cardiovascular  disorder  Social History: Reviewed history from 12/18/2008 and no changes required. Divorced Never Smoked Alcohol use-no Drug use-no Regular exercise-yes  Review of Systems  The patient denies anorexia, fever, weight loss, weight gain, chest pain, syncope, dyspnea on exertion, peripheral edema, prolonged cough, headaches, hemoptysis, abdominal pain, hematuria, suspicious skin lesions, difficulty walking, depression, and angioedema.   GI:  Denies abdominal pain, change in bowel habits, constipation, diarrhea, indigestion, loss of appetite, nausea, vomiting, vomiting blood, and yellowish skin color. Neuro:  Denies difficulty with concentration, disturbances in coordination, headaches, memory loss, numbness, poor balance, seizures, tingling, tremors, visual disturbances, and weakness.  Physical Exam  General:  alert, well-developed, well-nourished, well-hydrated, appropriate dress, normal appearance, healthy-appearing, cooperative to examination, and good hygiene.   Head:  normocephalic, atraumatic, no abnormalities observed, and no abnormalities palpated.   Eyes:  vision grossly intact, pupils equal, and pupils round.   Mouth:  Oral mucosa and oropharynx without lesions or exudates.  Teeth in good repair. Neck:  supple, full ROM, no masses, no thyromegaly, no JVD, normal carotid upstroke, no carotid bruits, no cervical lymphadenopathy, and no neck tenderness.   Lungs:  normal respiratory effort, no intercostal retractions, no accessory muscle use, normal breath sounds, no dullness, no fremitus, no crackles, and no wheezes.   Heart:  normal rate, regular rhythm, no murmur, no gallop, no rub, and no JVD.   Abdomen:  soft, non-tender, normal bowel sounds, no distention, no masses, no guarding, no rigidity, no rebound tenderness, no abdominal hernia, no inguinal hernia, no hepatomegaly, and no splenomegaly.   Msk:  No deformity or scoliosis noted of thoracic or lumbar spine.   Pulses:  R and L  carotid,radial,femoral,dorsalis pedis and posterior tibial pulses are full and equal bilaterally Extremities:  No clubbing, cyanosis, edema, or deformity noted with normal full range of motion of all joints.   Neurologic:  No cranial nerve deficits noted. Station and gait are normal. Plantar reflexes are down-going bilaterally. DTRs are symmetrical throughout. Sensory, motor and coordinative functions appear intact. Skin:  Intact without suspicious lesions or rashes Psych:  Cognition and judgment appear intact. Alert and cooperative with normal attention span and concentration. No apparent delusions, illusions, hallucinations   Impression & Recommendations:  Problem # 1:  HYPONATREMIA, MILD (ICD-276.1) this is due to psychogenic polydipsia-she will drink 1/2 of her current water intake, also will stop Valproic acid to see if that resolves the SIADH  Problem # 2:  HEMATURIA (ICD-599.70) Assessment: New will recheck for blood and culture for infection Orders: TLB-Udip w/ Micro (81001-URINE) T-Urine Culture (Spectrum Order) [id number])  Problem # 3:  GERD (ICD-530.81) Assessment: Improved  Her updated medication list for this problem includes:    Nexium 40 Mg Cpdr (Esomeprazole magnesium) .Marland Kitchen... 1 by mouth once daily  Complete Medication List: 1)  Labetalol Hcl 200 Mg Tabs (Labetalol hcl) .... Take 1 tablet by mouth two times a day 2)  Citalopram Hydrobromide 20 Mg Tabs (Citalopram hydrobromide) .... Take 1 tablet by mouth once a day 3)  Simvastatin 80 Mg Tabs (  Simvastatin) .... Take 1 tablet by mouth once a day 4)  Citracal Plus Tabs (Multiple minerals-vitamins) .... Once daily 5)  Omega-3 Fish Oil 1000 Mg Caps (Omega-3 fatty acids) .... Two caps a day 6)  Cinnamon 500 Mg Caps (Cinnamon) .... Two caps a day 7)  Lorazepam 0.5 Mg Tabs (Lorazepam) .... Take 1 tablet by mouth three times a day and 4qhs 8)  Hydroxyzine Hcl 10 Mg/67ml Syrp (Hydroxyzine hcl) .... 5-10 ml by mouth qid as  needed for itching 9)  Nexium 40 Mg Cpdr (Esomeprazole magnesium) .Marland Kitchen.. 1 by mouth once daily 10)  Promethazine Hcl 25 Mg Tabs (Promethazine hcl) .Marland Kitchen.. 1 by mouth every 4 hours as needed for nausea   Patient Instructions: 1)  Please schedule a follow-up appointment in 2 months. 2)  It is important that you exercise regularly at least 20 minutes 5 times a week. If you develop chest pain, have severe difficulty breathing, or feel very tired , stop exercising immediately and seek medical attention. 3)  You need to lose weight. Consider a lower calorie diet and regular exercise.  Prescriptions: NEXIUM 40 MG CPDR (ESOMEPRAZOLE MAGNESIUM) 1 by mouth once daily  #30 Capsule x 11   Entered and Authorized by:   Etta Grandchild MD   Signed by:   Etta Grandchild MD on 10/23/2009   Method used:   Electronically to        [id number] (retail)       [address]       [place], [place]  [address]       Ph: [phone] or [phone]       Fax: [phone]   RxID:   [id number]

## 2010-02-09 NOTE — Consult Note (Signed)
Summary: [facility]   Imported By: Sherian Rein 10/31/2009 11:01:56  _____________________________________________________________________  External Attachment:    Type:   Image     Comment:   External Document

## 2010-02-09 NOTE — Assessment & Plan Note (Signed)
Summary: STOMACH PROBLEMS/NWS   Vital Signs:  Patient profile:   60 year old female Height:      65 inches (165.10 cm) Weight:      179.8 pounds (81.73 kg) O2 Sat:      95 % on Room air Temp:     98.5 degrees F (36.94 degrees C) oral Pulse rate:   74 / minute BP sitting:   132 / 82  (left arm) Cuff size:   large  Vitals Entered By: Orlan Leavens RMA (October 09, 2009 11:11 AM)  O2 Flow:  Room air CC: Stomach problems, Abdominal pain Is Patient Diabetic? No Pain Assessment Patient in pain? no        Primary Care Provider:  Etta Grandchild MD  CC:  Stomach problems and Abdominal pain.  History of Present Illness:  Abdominal Pain      This is a 60 year old woman who presents with Abdominal pain.  The symptoms began 1 week ago.  On a scale of 1 to 10, the intensity is described as a 7.  taking pepto for same because nexium not working- "terrible burn".  The patient reports nausea, diarrhea, and anorexia, but denies vomiting, constipation, hematochezia, and hematemesis.  The location of the pain is epigastric.  The pain is described as radiating to the back.  Associated symptoms include dysuria.  The patient denies the following symptoms: fever and chest pain.  The pain is worse with food.  The pain is better with sleep. denies prior EGD or known ulcer hx.    Clinical Review Panels:  CBC   WBC:  8.4 (07/28/2009)   RBC:  3.78 (07/28/2009)   Hgb:  12.4 (07/28/2009)   Hct:  36.2 (07/28/2009)   Platelets:  242.0 (07/28/2009)   MCV  95.6 (07/28/2009)   MCHC  34.2 (07/28/2009)   RDW  11.8 (07/28/2009)   PMN:  70.1 (07/28/2009)   Lymphs:  17.1 (07/28/2009)   Monos:  10.8 (07/28/2009)   Eosinophils:  1.5 (07/28/2009)   Basophil:  0.5 (07/28/2009)  Complete Metabolic Panel   Glucose:  91 (05/20/2009)   Sodium:  136 (05/20/2009)   Potassium:  4.7 (05/20/2009)   Chloride:  100 (05/20/2009)   CO2:  28 (05/20/2009)   BUN:  19 (05/20/2009)   Creatinine:  0.7 (05/20/2009)  Albumin:  4.1 (07/28/2009)   Total Protein:  6.6 (07/28/2009)   Calcium:  9.4 (05/20/2009)   Total Bili:  0.6 (07/28/2009)   Alk Phos:  47 (07/28/2009)   SGPT (ALT):  17 (07/28/2009)   SGOT (AST):  21 (07/28/2009)   Current Medications (verified): 1)  Labetalol Hcl 200 Mg Tabs (Labetalol Hcl) .... Take 1 Tablet By Mouth Two Times A Day 2)  Valproic Acid 250 Mg Caps (Valproic Acid) .... 4  A Day 3)  Citalopram Hydrobromide 20 Mg Tabs (Citalopram Hydrobromide) .... Take 1 Tablet By Mouth Once A Day 4)  Simvastatin 80 Mg Tabs (Simvastatin) .... Take 1 Tablet By Mouth Once A Day 5)  Citracal Plus  Tabs (Multiple Minerals-Vitamins) .... Once Daily 6)  Omega-3 Fish Oil 1000 Mg Caps (Omega-3 Fatty Acids) .... Two Caps A Day 7)  Cinnamon 500 Mg Caps (Cinnamon) .... Two Caps A Day 8)  Lorazepam 0.5 Mg Tabs (Lorazepam) .... Take 1 Tablet By Mouth Three Times A Day and 4qhs 9)  Hydroxyzine Hcl 10 Mg/37ml Syrp (Hydroxyzine Hcl) .... 5-10 Ml By Mouth Qid As Needed For Itching 10)  Nexium 40 Mg Cpdr (Esomeprazole  Magnesium) .Marland Kitchen.. 1 By Mouth Once Daily  Allergies (verified): 1)  ! Morphine  Past History:  Past Medical History: HYPERTENSION, BENIGN ESSENTIAL (ICD-401.1) HYPERLIPIDEMIA (ICD-272.4) DIVERTICULOSIS, COLON (ICD-562.10) OVERACTIVE BLADDER (ICD-596.51) ANEMIA (ICD-285.9) OSTEOPENIA (ICD-733.90) HEADACHE (ICD-784.0) BIPOLAR DISORDER UNSPECIFIED (ICD-296.80)  GI - [facility] (not going back) neuro - [facility] (not going back)  Review of Systems       The patient complains of headaches, abdominal pain, and severe indigestion/heartburn.  The patient denies weight loss, chest pain, syncope, melena, and hematochezia.    Physical Exam  General:  alert, well-developed, well-nourished, and cooperative to examination.   pale appearing Lungs:  normal respiratory effort, no intercostal retractions or use of accessory muscles; normal breath sounds bilaterally - no crackles and no wheezes.      Heart:  normal rate, regular rhythm, no murmur, and no rub. BLE without edema.  Abdomen:  soft, non-tender, normal bowel sounds, no distention; no masses and no appreciable hepatomegaly or splenomegaly.     Impression & Recommendations:  Problem # 1:  ABDOMINAL PAIN, UNSPECIFIED SITE (ICD-789.00) epigastric but also low pelvic and back - check labs, CT and refer to GI - try different PPI - aciphex samples given Orders: TLB-BMP (Basic Metabolic Panel-BMET) (80048-METABOL) TLB-CBC Platelet - w/Differential (85025-CBCD) TLB-Hepatic/Liver Function Pnl (80076-HEPATIC) Gastroenterology Referral (GI) TLB-Udip w/ Micro (81001-URINE) Misc. Referral (Misc. Ref) Prescription Created Electronically [id number])  Problem # 2:  GERD (ICD-530.81)  Her updated medication list for this problem includes:    Nexium 40 Mg Cpdr (Esomeprazole magnesium) .Marland Kitchen... 1 by mouth once daily    Aciphex 20 Mg Tbec (Rabeprazole sodium) .Marland Kitchen... 1 by mouth once daily in place of nexium x 10 days  Orders: TLB-BMP (Basic Metabolic Panel-BMET) (80048-METABOL) TLB-CBC Platelet - w/Differential (85025-CBCD) TLB-Hepatic/Liver Function Pnl (80076-HEPATIC) TLB-TSH (Thyroid Stimulating Hormone) (60454-UJW) Gastroenterology Referral (GI) Misc. Referral (Misc. Ref) Prescription Created Electronically [id number])  Problem # 3:  DIARRHEA (ICD-787.91) ?related to pepto - no fever -  check lab and CT as ?related to abd pain and reflux symptoms vs PPI tx - GI to help advise on same Orders: TLB-BMP (Basic Metabolic Panel-BMET) (80048-METABOL) TLB-CBC Platelet - w/Differential (85025-CBCD) TLB-Hepatic/Liver Function Pnl (80076-HEPATIC) TLB-TSH (Thyroid Stimulating Hormone) (78295-AOZ) Gastroenterology Referral (GI) Misc. Referral (Misc. Ref)  Complete Medication List: 1)  Labetalol Hcl 200 Mg Tabs (Labetalol hcl) .... Take 1 tablet by mouth two times a day 2)  Valproic Acid 250 Mg Caps (Valproic acid) .... 4  a day 3)  Citalopram  Hydrobromide 20 Mg Tabs (Citalopram hydrobromide) .... Take 1 tablet by mouth once a day 4)  Simvastatin 80 Mg Tabs (Simvastatin) .... Take 1 tablet by mouth once a day 5)  Citracal Plus Tabs (Multiple minerals-vitamins) .... Once daily 6)  Omega-3 Fish Oil 1000 Mg Caps (Omega-3 fatty acids) .... Two caps a day 7)  Cinnamon 500 Mg Caps (Cinnamon) .... Two caps a day 8)  Lorazepam 0.5 Mg Tabs (Lorazepam) .... Take 1 tablet by mouth three times a day and 4qhs 9)  Hydroxyzine Hcl 10 Mg/49ml Syrp (Hydroxyzine hcl) .... 5-10 ml by mouth qid as needed for itching 10)  Nexium 40 Mg Cpdr (Esomeprazole magnesium) .Marland Kitchen.. 1 by mouth once daily 11)  Aciphex 20 Mg Tbec (Rabeprazole sodium) .Marland Kitchen.. 1 by mouth once daily in place of nexium x 10 days 12)  Promethazine Hcl 25 Mg Tabs (Promethazine hcl) .Marland Kitchen.. 1 by mouth every 4 hours as needed for nausea  Patient Instructions: 1)  it was good to see you  today. 2)  test(s) ordered today - your results will be posted on the phone tree for review in 48-72 hours from the time of test completion; call [phone] and enter your 9 digit MRN (listed above on this page, just below your name); if any changes need to be made or there are abnormal results, you will be contacted directly. 3)  we'll make referral for CT scan and to [facility] for evaluation of stomach pain symptoms. Our office will contact you regarding these appointments once made.  4)  change nexium to aciphex for next 10 days (samples given) - then resume nexium as before - also use promethazine for nausea symptoms - your prescriptions have been electronically submitted to your pharmacy. Please take as directed. Contact our office if you believe you're having problems with the medication(s).  5)  Please schedule a follow-up appointment in 2 weeks with dr. Yetta Barre, call sooner if problems.  Prescriptions: PROMETHAZINE HCL 25 MG TABS (PROMETHAZINE HCL) 1 by mouth every 4 hours as needed for nausea  #30 x 1   Entered and  Authorized by:   Newt Lukes MD   Signed by:   Newt Lukes MD on 10/09/2009   Method used:   Electronically to        [id number] ([facility]ail[facility]       [address]       [place], [place]  [address]       Ph: [phone] or [phone]       Fax: [phone]   RxID:   [id number]

## 2010-02-09 NOTE — Assessment & Plan Note (Signed)
Summary: ? allergies/cd   Vital Signs:  Patient profile:   60 year old female Height:      65 inches Weight:      171 pounds BMI:     28.56 O2 Sat:      95 % on Room air Temp:     98.8 degrees F oral Pulse rate:   63 / minute Pulse rhythm:   regular Resp:     16 per minute BP sitting:   100 / 64  (left arm) Cuff size:   large  Vitals Entered By: Rock Nephew CMA (April 23, 2009 1:44 PM)  Nutrition Counseling: Patient's BMI is greater than 25 and therefore counseled on weight management options.  O2 Flow:  Room air CC: cough, itchy allergies, Hypertension Management Is Patient Diabetic? No Pain Assessment Patient in pain? no        Primary Care Provider:  Etta Grandchild MD  CC:  cough, itchy allergies, and Hypertension Management.  History of Present Illness: She returns c/o rash and itching on both arms. The rash ends at the edge of her short shirt sleeves. She has been putting Sarna on it.  Hypertension History:      She denies headache, chest pain, palpitations, dyspnea with exertion, orthopnea, PND, peripheral edema, visual symptoms, neurologic problems, syncope, and side effects from treatment.  She notes no problems with any antihypertensive medication side effects.        Positive major cardiovascular risk factors include female age 18 years old or older, hyperlipidemia, and hypertension.  Negative major cardiovascular risk factors include no history of diabetes, negative family history for ischemic heart disease, and non-tobacco-user status.        Further assessment for target organ damage reveals no history of ASHD, cardiac end-organ damage (CHF/LVH), stroke/TIA, peripheral vascular disease, renal insufficiency, or hypertensive retinopathy.     Preventive Screening-Counseling & Management  Alcohol-Tobacco     Alcohol drinks/day: 0     Smoking Status: never     Passive Smoke Exposure: no  Hep-HIV-STD-Contraception     Hepatitis Risk: no risk noted  HIV Risk: no     STD Risk: no risk noted      Drug Use:  no.    Medications Prior to Update: 1)  Labetalol Hcl 200 Mg Tabs (Labetalol Hcl) .... Take 1 Tablet By Mouth Two Times A Day 2)  Valproic Acid 250 Mg Caps (Valproic Acid) .... 4  A Day 3)  Citalopram Hydrobromide 20 Mg Tabs (Citalopram Hydrobromide) .... Take 1 Tablet By Mouth Once A Day 4)  Simvastatin 80 Mg Tabs (Simvastatin) .... Take 1 Tablet By Mouth Once A Day 5)  Citracal Plus  Tabs (Multiple Minerals-Vitamins) .... Once Daily 6)  Omega-3 Fish Oil 1000 Mg Caps (Omega-3 Fatty Acids) .... Two Caps A Day 7)  Cinnamon 500 Mg Caps (Cinnamon) .... Two Caps A Day 8)  Lorazepam 0.5 Mg Tabs (Lorazepam) .... Take 1 Tablet By Mouth Three Times A Day and 4qhs 9)  Maxalt-Mlt 10 Mg Tbdp (Rizatriptan Benzoate) .... Take One As Neede For Headache 10)  Tussionex Pennkinetic Er 8-10 Mg/61ml Lqcr (Chlorpheniramine-Hydrocodone) .... % Ml By Mouth Two Times A Day As Needed For Cough  Current Medications (verified): 1)  Labetalol Hcl 200 Mg Tabs (Labetalol Hcl) .... Take 1 Tablet By Mouth Two Times A Day 2)  Valproic Acid 250 Mg Caps (Valproic Acid) .... 4  A Day 3)  Citalopram Hydrobromide 20 Mg Tabs (Citalopram Hydrobromide) .... Take 1  Tablet By Mouth Once A Day 4)  Simvastatin 80 Mg Tabs (Simvastatin) .... Take 1 Tablet By Mouth Once A Day 5)  Citracal Plus  Tabs (Multiple Minerals-Vitamins) .... Once Daily 6)  Omega-3 Fish Oil 1000 Mg Caps (Omega-3 Fatty Acids) .... Two Caps A Day 7)  Cinnamon 500 Mg Caps (Cinnamon) .... Two Caps A Day 8)  Lorazepam 0.5 Mg Tabs (Lorazepam) .... Take 1 Tablet By Mouth Three Times A Day and 4qhs 9)  Lodrane 24 12 Mg Xr24h-Cap (Brompheniramine Maleate) .Marland Kitchen.. 1-2 By Mouth Once Daily For Rash/itching 10)  Triamcinolone Acetonide 0.1 % Lotn (Triamcinolone Acetonide) .... Apply To Rash Two Times A Day  Allergies (verified): 1)  ! Morphine  Past History:  Past Medical History: Reviewed history from 12/18/2008  and no changes required. Current Problems:  ELECTROCARDIOGRAM, ABNORMAL (ICD-794.31) HYPERTENSION, BENIGN ESSENTIAL (ICD-401.1) HYPERLIPIDEMIA (ICD-272.4) DIVERTICULOSIS, COLON (ICD-562.10) OVERACTIVE BLADDER (ICD-596.51) ANEMIA (ICD-285.9) OSTEOPENIA (ICD-733.90) FAMILY HISTORY DIABETES 1ST DEGREE RELATIVE (ICD-V18.0) FAMILY HISTORY OF CAD FEMALE 1ST DEGREE RELATIVE <50 (ICD-V17.3) HEADACHE (ICD-784.0) URINARY INCONTINENCE (ICD-788.30) BIPOLAR DISORDER UNSPECIFIED (ICD-296.80) ENCOUNTER FOR LONG-TERM USE OF OTHER MEDICATIONS (ICD-V58.69)  Past Surgical History: Reviewed history from 12/18/2008 and no changes required. Hysterectomy  Family History: Reviewed history from 12/18/2008 and no changes required. Family History of CAD Female 1st degree relative <50 Family History Diabetes 1st degree relative Family History Kidney disease Family History of Cardiovascular disorder  Social History: Reviewed history from 12/18/2008 and no changes required. Divorced Never Smoked Alcohol use-no Drug use-no Regular exercise-yes Hepatitis Risk:  no risk noted STD Risk:  no risk noted  Review of Systems  The patient denies anorexia, fever, weight loss, weight gain, chest pain, syncope, dyspnea on exertion, peripheral edema, prolonged cough, headaches, hemoptysis, abdominal pain, suspicious skin lesions, and enlarged lymph nodes.   Derm:  Complains of dryness, itching, and rash; denies changes in color of skin, changes in nail beds, excessive perspiration, flushing, hair loss, insect bite(s), lesion(s), and poor wound healing.  Physical Exam  General:  Well-developed,well-nourished,in no acute distress; alert,appropriate and cooperative throughout examination Skin:  Over both upper extremities, symmetrically there is dermagraphism, xerosis, erythema, and scaling. There is no lichenification, pustules, vesicles, streaking, or induration. Cervical Nodes:  no anterior cervical adenopathy and no  posterior cervical adenopathy.   Axillary Nodes:  no R axillary adenopathy and no L axillary adenopathy.   Inguinal Nodes:  no R inguinal adenopathy and no L inguinal adenopathy.   Psych:  Oriented X3, memory intact for recent and remote, normally interactive, good eye contact, not anxious appearing, not depressed appearing, not agitated, and not suicidal.     Impression & Recommendations:  Problem # 1:  CONTACT DERMATITIS&OTH ECZEMA DUE OTH SPEC AGENT (ICD-692.89) Assessment New stop sarna, protect from the sum. and treat as below. Her updated medication list for this problem includes:    Lodrane 24 12 Mg Xr24h-cap (Brompheniramine maleate) .Marland Kitchen... 1-2 by mouth once daily for rash/itching    Triamcinolone Acetonide 0.1 % Lotn (Triamcinolone acetonide) .Marland Kitchen... Apply to rash two times a day  Problem # 2:  HYPERTENSION, BENIGN ESSENTIAL (ICD-401.1) Assessment: Improved  Her updated medication list for this problem includes:    Labetalol Hcl 200 Mg Tabs (Labetalol hcl) .Marland Kitchen... Take 1 tablet by mouth two times a day  BP today: 100/64 Prior BP: 134/80 (03/30/2009)  Prior 10 Yr Risk Heart Disease: 11 % (03/30/2009)  Labs Reviewed: K+: 4.5 (09/04/2008) Creat: : 0.8 (09/04/2008)   Chol: 167 (11/02/2007)   HDL: 48.6 (  11/02/2007)   LDL: 100 (11/02/2007)   TG: 92 (11/02/2007)  Complete Medication List: 1)  Labetalol Hcl 200 Mg Tabs (Labetalol hcl) .... Take 1 tablet by mouth two times a day 2)  Valproic Acid 250 Mg Caps (Valproic acid) .... 4  a day 3)  Citalopram Hydrobromide 20 Mg Tabs (Citalopram hydrobromide) .... Take 1 tablet by mouth once a day 4)  Simvastatin 80 Mg Tabs (Simvastatin) .... Take 1 tablet by mouth once a day 5)  Citracal Plus Tabs (Multiple minerals-vitamins) .... Once daily 6)  Omega-3 Fish Oil 1000 Mg Caps (Omega-3 fatty acids) .... Two caps a day 7)  Cinnamon 500 Mg Caps (Cinnamon) .... Two caps a day 8)  Lorazepam 0.5 Mg Tabs (Lorazepam) .... Take 1 tablet by mouth  three times a day and 4qhs 9)  Lodrane 24 12 Mg Xr24h-cap (Brompheniramine maleate) .Marland Kitchen.. 1-2 by mouth once daily for rash/itching 10)  Triamcinolone Acetonide 0.1 % Lotn (Triamcinolone acetonide) .... Apply to rash two times a day  Hypertension Assessment/Plan:      The patient's hypertensive risk group is category B: At least one risk factor (excluding diabetes) with no target organ damage.  Her calculated 10 year risk of coronary heart disease is 7 %.  Today's blood pressure is 100/64.  Her blood pressure goal is < 140/90.  Patient Instructions: 1)  Please schedule a follow-up appointment in 2 weeks. 2)  It is important that you exercise regularly at least 20 minutes 5 times a week. If you develop chest pain, have severe difficulty breathing, or feel very tired , stop exercising immediately and seek medical attention. 3)  You need to lose weight. Consider a lower calorie diet and regular exercise.  Prescriptions: TRIAMCINOLONE ACETONIDE 0.1 % LOTN (TRIAMCINOLONE ACETONIDE) Apply to rash two times a day  #60 gms x 3   Entered and Authorized by:   Etta Grandchild MD   Signed by:   Etta Grandchild MD on 04/23/2009   Method used:   Print then Give to Patient   RxID:   [id number] LODRANE 24 12 MG XR24H-CAP (BROMPHENIRAMINE MALEATE) 1-2 by mouth once daily for rash/itching  #22 x 0   Entered and Authorized by:   Etta Grandchild MD   Signed by:   Etta Grandchild MD on 04/23/2009   Method used:   Samples Given   RxID:   [id number]

## 2010-02-09 NOTE — Consult Note (Signed)
Summary: [facility]   Imported By: Sherian Rein 06/16/2009 09:46:03  _____________________________________________________________________  External Attachment:    Type:   Image     Comment:   External Document

## 2010-02-09 NOTE — Letter (Signed)
Summary: [facility]   Imported By: Sherian Rein 10/23/2009 09:42:03  _____________________________________________________________________  External Attachment:    Type:   Image     Comment:   External Document

## 2010-02-09 NOTE — Procedures (Signed)
Summary: summary report  summary report   Imported By: Mirna Mires 02/02/2009 15:24:28  _____________________________________________________________________  External Attachment:    Type:   Image     Comment:   External Document

## 2010-02-09 NOTE — Letter (Signed)
[facility]   Imported By: Marylou Mccoy 08/18/2009 11:49:51  _____________________________________________________________________  External Attachment:    Type:   Image     Comment:   External Document

## 2010-02-09 NOTE — Assessment & Plan Note (Signed)
Summary: DR Nemiah Commander PT/[facility]--CHEST PAIN THAT SUBSIDES W/BAKING SOD.Marland KitchenMarland Kitchen   Vital Signs:  Patient profile:   60 year old female Height:      65 inches (165.10 cm) Weight:      174.0 pounds (79.09 kg) O2 Sat:      94 % on Room air Temp:     98.8 degrees F (37.11 degrees C) oral Pulse rate:   86 / minute BP sitting:   124 / 82  (left arm) Cuff size:   regular  Vitals Entered By: Orlan Leavens (July 28, 2009 1:41 PM)  O2 Flow:  Room air CC: chest pain Is Patient Diabetic? No Pain Assessment Patient in pain? yes     Location: chest Type: burning Comments Pt states she been having this pain x's 6 days. Started out having pain in middle pf her back. Pain has moved around to her chest area. Pt ? Acid reflux she states she took 2 tums with coke, did  not help. also pt states she had mix 1 teaspoon baking soda with water. Stop the chest pain but now she been having diarrhea. Pt denies SOB, pain down left arm, numbness   Primary Care Provider:  Etta Grandchild MD  CC:  chest pain.  History of Present Illness:       This is a 60 year old female who presents with Chest pain.  The symptoms began duration > 3 days ago.  On a scale of 1 to 10, the intensity is described as a 5.  No history of CAD.  The patient reports resting chest pain, nausea, yellow diarrhea, and indigestion, but denies vomiting, diaphoresis, shortness of breath, palpitations, dizziness, light headedness, and syncope.  The pain is described as intermittent, sharp, and burning.  The pain is located in the substernal area and epigastric area.  The pain radiates to the back.  Episodes of chest pain last >30 minutes.  The pain is brought on or made worse by meals.  The pain is relieved or improved with antacids, especially sodium bicarb in cold water.  No history of PUD bt does take Advil 3-4x/week for headches, none in last 2 days,  Clinical Review Panels:  CBC   WBC:  7.2 (05/20/2009)   RBC:  3.82 (05/20/2009)   Hgb:  12.4  (05/20/2009)   Hct:  35.7 (05/20/2009)   Platelets:  251.0 (05/20/2009)   MCV  93.6 (05/20/2009)   MCHC  34.7 (05/20/2009)   RDW  12.9 (05/20/2009)   PMN:  62.4 (05/20/2009)   Lymphs:  22.5 (05/20/2009)   Monos:  12.1 (05/20/2009)   Eosinophils:  2.1 (05/20/2009)   Basophil:  0.9 (05/20/2009)   Current Medications (verified): 1)  Labetalol Hcl 200 Mg Tabs (Labetalol Hcl) .... Take 1 Tablet By Mouth Two Times A Day 2)  Valproic Acid 250 Mg Caps (Valproic Acid) .... 4  A Day 3)  Citalopram Hydrobromide 20 Mg Tabs (Citalopram Hydrobromide) .... Take 1 Tablet By Mouth Once A Day 4)  Simvastatin 80 Mg Tabs (Simvastatin) .... Take 1 Tablet By Mouth Once A Day 5)  Citracal Plus  Tabs (Multiple Minerals-Vitamins) .... Once Daily 6)  Omega-3 Fish Oil 1000 Mg Caps (Omega-3 Fatty Acids) .... Two Caps A Day 7)  Cinnamon 500 Mg Caps (Cinnamon) .... Two Caps A Day 8)  Lorazepam 0.5 Mg Tabs (Lorazepam) .... Take 1 Tablet By Mouth Three Times A Day and 4qhs 9)  Triamcinolone Acetonide 0.1 % Lotn (Triamcinolone Acetonide) .... Apply  To Rash Two Times A Day 10)  Tramadol Hcl 50 Mg Tabs (Tramadol Hcl) .... One By Mouth Qid As Needed For Headache 11)  Hydroxyzine Hcl 10 Mg/77ml Syrp (Hydroxyzine Hcl) .... 5-10 Ml By Mouth Qid As Needed For Itching  Allergies (verified): 1)  ! Morphine  Past History:  Past Medical History: ELECTROCARDIOGRAM, ABNORMAL (ICD-794.31) HYPERTENSION, BENIGN ESSENTIAL (ICD-401.1) HYPERLIPIDEMIA (ICD-272.4) DIVERTICULOSIS, COLON (ICD-562.10) OVERACTIVE BLADDER (ICD-596.51) ANEMIA (ICD-285.9) OSTEOPENIA (ICD-733.90) HEADACHE (ICD-784.0) BIPOLAR DISORDER UNSPECIFIED (ICD-296.80)  Review of Systems       The patient complains of chest pain and severe indigestion/heartburn.  The patient denies fever, syncope, peripheral edema, headaches, abdominal pain, melena, and hematochezia.    Physical Exam  General:  alert, well-developed, well-nourished, and cooperative to  examination.   pale appearing Eyes:  vision grossly intact; pupils equal, round and reactive to light.  conjunctiva min pale and lids normal.    Ears:  normal pinnae bilaterally, without erythema, swelling, or tenderness to palpation. TMs clear, without effusion, or cerumen impaction. Hearing grossly normal bilaterally  Mouth:  teeth and gums in good repair; mucous membranes moist, without lesions or ulcers. oropharynx clear without exudate, no erythema.  Lungs:  normal respiratory effort, no intercostal retractions or use of accessory muscles; normal breath sounds bilaterally - no crackles and no wheezes.    Heart:  normal rate, regular rhythm, no murmur, and no rub. BLE without edema.  Abdomen:  soft, non-tender, normal bowel sounds, no distention; no masses and no appreciable hepatomegaly or splenomegaly.     Impression & Recommendations:  Problem # 1:  CHEST PAIN (ICD-786.50)  by history appears GERD-like GI symptoms - exam and EKG benign - check CBC r/o anemia (risk PUD with advil use but no melena) and chcek LFTs (?biliary) start PPI - two times a day x 3 days, then once daily -nexium samples given + new erx  Orders: EKG w/ Interpretation (93000) TLB-CBC Platelet - w/Differential (85025-CBCD) TLB-Hepatic/Liver Function Pnl (80076-HEPATIC)  Problem # 2:  GERD (ICD-530.81)  Labs Reviewed: Hgb: 12.4 (05/20/2009)   Hct: 35.7 (05/20/2009)  Her updated medication list for this problem includes:    Nexium 40 Mg Cpdr (Esomeprazole magnesium) .Marland Kitchen... 1 by mouth once daily  Orders: Prescription Created Electronically [phone]) TLB-CBC Platelet - w/Differential (85025-CBCD) TLB-Hepatic/Liver Function Pnl (80076-HEPATIC)  Complete Medication List: 1)  Labetalol Hcl 200 Mg Tabs (Labetalol hcl) .... Take 1 tablet by mouth two times a day 2)  Valproic Acid 250 Mg Caps (Valproic acid) .... 4  a day 3)  Citalopram Hydrobromide 20 Mg Tabs (Citalopram hydrobromide) .... Take 1 tablet by mouth  once a day 4)  Simvastatin 80 Mg Tabs (Simvastatin) .... Take 1 tablet by mouth once a day 5)  Citracal Plus Tabs (Multiple minerals-vitamins) .... Once daily 6)  Omega-3 Fish Oil 1000 Mg Caps (Omega-3 fatty acids) .... Two caps a day 7)  Cinnamon 500 Mg Caps (Cinnamon) .... Two caps a day 8)  Lorazepam 0.5 Mg Tabs (Lorazepam) .... Take 1 tablet by mouth three times a day and 4qhs 9)  Triamcinolone Acetonide 0.1 % Lotn (Triamcinolone acetonide) .... Apply to rash two times a day 10)  Tramadol Hcl 50 Mg Tabs (Tramadol hcl) .... One by mouth qid as needed for headache 11)  Hydroxyzine Hcl 10 Mg/66ml Syrp (Hydroxyzine hcl) .... 5-10 ml by mouth qid as needed for itching 12)  Nexium 40 Mg Cpdr (Esomeprazole magnesium) .Marland Kitchen.. 1 by mouth once daily  Patient Instructions: 1)  it was  good to see you today. 2)  exam and EKG look fine - pain is not from your heart - 3)  test(s) ordered today - your results will be posted on the phone tree for review in 48-72 hours from the time of test completion; call [phone] and enter your 9 digit MRN (listed above on this page, just below your name); if any changes need to be made or there are abnormal results, you will be contacted directly.  4)  start nexium in place of baking soda for your pain symptoms - samples x 10 pills and your prescription has been electronically submitted to your pharmacy. Please take as directed. Contact our office if you believe you're having problems with the medication(s).  5)  if continued pain symptoms despite this medication, we may need to look at your gallbladder - 6)  Please schedule a follow-up appointment in 2 weeks with Dr. Yetta Barre to review, sooner if problems. Prescriptions: NEXIUM 40 MG CPDR (ESOMEPRAZOLE MAGNESIUM) 1 by mouth once daily  #30 x 1   Entered and Authorized by:   Newt Lukes MD   Signed by:   Newt Lukes MD on 07/28/2009   Method used:   Electronically to        [id number] (retail)        [address]       [place], [place]  [address]       Ph: [phone] or [phone]       Fax: [phone]   RxID:   [id number]

## 2010-02-09 NOTE — Assessment & Plan Note (Signed)
Summary: head ache--nausea--itching--stc   Vital Signs:  Patient profile:   60 year old female Height:      65 inches Weight:      170 pounds BMI:     28.39 O2 Sat:      95 % on Room air Temp:     99.2 degrees F oral Pulse rate:   84 / minute Pulse rhythm:   regular Resp:     16 per minute BP sitting:   98 / 62  (left arm) Cuff size:   large  Vitals Entered By: Rock Nephew CMA (May 12, 2009 8:49 AM)  Nutrition Counseling: Patient's BMI is greater than 25 and therefore counseled on weight management options.  O2 Flow:  Room air CC: continues itching, headache and nausea   Primary Care Provider:  Etta Grandchild MD  CC:  continues itching and headache and nausea.  History of Present Illness: She has an intermittent HA but in general she feels better. The HA description is the same as last visit. She has had some itching but no rash.  Preventive Screening-Counseling & Management  Alcohol-Tobacco     Alcohol drinks/day: 0     Smoking Status: never     Passive Smoke Exposure: no  Hep-HIV-STD-Contraception     Hepatitis Risk: no risk noted     HIV Risk: no     STD Risk: no risk noted      Drug Use:  no.    Medications Prior to Update: 1)  Labetalol Hcl 200 Mg Tabs (Labetalol Hcl) .... Take 1 Tablet By Mouth Two Times A Day 2)  Valproic Acid 250 Mg Caps (Valproic Acid) .... 4  A Day 3)  Citalopram Hydrobromide 20 Mg Tabs (Citalopram Hydrobromide) .... Take 1 Tablet By Mouth Once A Day 4)  Simvastatin 80 Mg Tabs (Simvastatin) .... Take 1 Tablet By Mouth Once A Day 5)  Citracal Plus  Tabs (Multiple Minerals-Vitamins) .... Once Daily 6)  Omega-3 Fish Oil 1000 Mg Caps (Omega-3 Fatty Acids) .... Two Caps A Day 7)  Cinnamon 500 Mg Caps (Cinnamon) .... Two Caps A Day 8)  Lorazepam 0.5 Mg Tabs (Lorazepam) .... Take 1 Tablet By Mouth Three Times A Day and 4qhs 9)  Triamcinolone Acetonide 0.1 % Lotn (Triamcinolone Acetonide) .... Apply To Rash Two Times A Day  Current  Medications (verified): 1)  Labetalol Hcl 200 Mg Tabs (Labetalol Hcl) .... Take 1 Tablet By Mouth Two Times A Day 2)  Valproic Acid 250 Mg Caps (Valproic Acid) .... 4  A Day 3)  Citalopram Hydrobromide 20 Mg Tabs (Citalopram Hydrobromide) .... Take 1 Tablet By Mouth Once A Day 4)  Simvastatin 80 Mg Tabs (Simvastatin) .... Take 1 Tablet By Mouth Once A Day 5)  Citracal Plus  Tabs (Multiple Minerals-Vitamins) .... Once Daily 6)  Omega-3 Fish Oil 1000 Mg Caps (Omega-3 Fatty Acids) .... Two Caps A Day 7)  Cinnamon 500 Mg Caps (Cinnamon) .... Two Caps A Day 8)  Lorazepam 0.5 Mg Tabs (Lorazepam) .... Take 1 Tablet By Mouth Three Times A Day and 4qhs 9)  Triamcinolone Acetonide 0.1 % Lotn (Triamcinolone Acetonide) .... Apply To Rash Two Times A Day 10)  Tramadol Hcl 50 Mg Tabs (Tramadol Hcl) .... One By Mouth Qid As Needed For Headache 11)  Hydroxyzine Hcl 10 Mg/30ml Syrp (Hydroxyzine Hcl) .... 5-10 Ml By Mouth Qid As Needed For Itching  Allergies (verified): 1)  ! Morphine  Past History:  Past Medical History: Reviewed  history from 12/18/2008 and no changes required. Current Problems:  ELECTROCARDIOGRAM, ABNORMAL (ICD-794.31) HYPERTENSION, BENIGN ESSENTIAL (ICD-401.1) HYPERLIPIDEMIA (ICD-272.4) DIVERTICULOSIS, COLON (ICD-562.10) OVERACTIVE BLADDER (ICD-596.51) ANEMIA (ICD-285.9) OSTEOPENIA (ICD-733.90) FAMILY HISTORY DIABETES 1ST DEGREE RELATIVE (ICD-V18.0) FAMILY HISTORY OF CAD FEMALE 1ST DEGREE RELATIVE <50 (ICD-V17.3) HEADACHE (ICD-784.0) URINARY INCONTINENCE (ICD-788.30) BIPOLAR DISORDER UNSPECIFIED (ICD-296.80) ENCOUNTER FOR LONG-TERM USE OF OTHER MEDICATIONS (ICD-V58.69)  Past Surgical History: Reviewed history from 12/18/2008 and no changes required. Hysterectomy  Family History: Reviewed history from 12/18/2008 and no changes required. Family History of CAD Female 1st degree relative <50 Family History Diabetes 1st degree relative Family History Kidney disease Family History  of Cardiovascular disorder  Social History: Reviewed history from 12/18/2008 and no changes required. Divorced Never Smoked Alcohol use-no Drug use-no Regular exercise-yes  Review of Systems       The patient complains of headaches.  The patient denies anorexia, fever, chest pain, syncope, dyspnea on exertion, prolonged cough, hemoptysis, abdominal pain, suspicious skin lesions, difficulty walking, enlarged lymph nodes, and angioedema.   Derm:  Complains of itching; denies changes in color of skin, changes in nail beds, dryness, excessive perspiration, flushing, hair loss, lesion(s), poor wound healing, and rash. Neuro:  Complains of headaches; denies brief paralysis, difficulty with concentration, disturbances in coordination, inability to speak, memory loss, numbness, poor balance, seizures, sensation of room spinning, tingling, tremors, visual disturbances, and weakness.  Physical Exam  General:  Well-developed,well-nourished,in no acute distress; alert,appropriate and cooperative throughout examination Head:  Normocephalic and atraumatic without obvious abnormalities. No apparent alopecia or balding. Eyes:  vision grossly intact, pupils equal, pupils round, and pupils reactive to light.   Mouth:  Oral mucosa and oropharynx without lesions or exudates.  Teeth in good repair. Neck:  supple, full ROM, and no masses.   Lungs:  Normal respiratory effort, chest expands symmetrically. Lungs are clear to auscultation, no crackles or wheezes. Heart:  Normal rate and regular rhythm. S1 and S2 normal without gallop, murmur, click, rub or other extra sounds. Abdomen:  soft, non-tender, normal bowel sounds, no distention, no masses, no guarding, no rigidity, no hepatomegaly, and no splenomegaly.   Msk:  normal ROM, no joint tenderness, no joint swelling, no joint warmth, no redness over joints, no joint deformities, no joint instability, no crepitation, and no muscle atrophy.   Pulses:  R and L  carotid,radial,femoral,dorsalis pedis and posterior tibial pulses are full and equal bilaterally Extremities:  No clubbing, cyanosis, edema, or deformity noted with normal full range of motion of all joints.   Neurologic:  No cranial nerve deficits noted. Station and gait are normal. Plantar reflexes are down-going bilaterally. DTRs are symmetrical throughout. Sensory, motor and coordinative functions appear intact. Skin:  Intact without suspicious lesions or rashes Cervical Nodes:  No lymphadenopathy noted Psych:  Cognition and judgment appear intact. Alert and cooperative with normal attention span and concentration. No apparent delusions, illusions, hallucinations   Impression & Recommendations:  Problem # 1:  CONTACT DERMATITIS&OTH ECZEMA DUE OTH SPEC AGENT (ICD-692.89) Assessment Deteriorated try atarax for itching Her updated medication list for this problem includes:    Triamcinolone Acetonide 0.1 % Lotn (Triamcinolone acetonide) .Marland Kitchen... Apply to rash two times a day  Orders: Venipuncture (16010) TLB-BMP (Basic Metabolic Panel-BMET) (80048-METABOL) TLB-CBC Platelet - w/Differential (85025-CBCD) TLB-Hepatic/Liver Function Pnl (80076-HEPATIC) TLB-Sedimentation Rate (ESR) (85652-ESR)  Problem # 2:  COMMON MIGRAINE (ICD-346.10) Assessment: Deteriorated  Her updated medication list for this problem includes:    Labetalol Hcl 200 Mg Tabs (Labetalol hcl) .Marland Kitchen... Take 1 tablet  by mouth two times a day    Tramadol Hcl 50 Mg Tabs (Tramadol hcl) ..... One by mouth qid as needed for headache  Orders: Venipuncture (16109) TLB-BMP (Basic Metabolic Panel-BMET) (80048-METABOL) TLB-CBC Platelet - w/Differential (85025-CBCD) TLB-Hepatic/Liver Function Pnl (80076-HEPATIC) TLB-Sedimentation Rate (ESR) (85652-ESR) T- * Misc. Laboratory test [phone]) Neurology Referral (Neuro)  Problem # 3:  HYPERTENSION, BENIGN ESSENTIAL (ICD-401.1) Assessment: Unchanged  Her updated medication list for this  problem includes:    Labetalol Hcl 200 Mg Tabs (Labetalol hcl) .Marland Kitchen... Take 1 tablet by mouth two times a day  Orders: Venipuncture (09811) TLB-BMP (Basic Metabolic Panel-BMET) (80048-METABOL) TLB-CBC Platelet - w/Differential (85025-CBCD) TLB-Hepatic/Liver Function Pnl (80076-HEPATIC) TLB-Sedimentation Rate (ESR) (85652-ESR)  BP today: 98/62 Prior BP: 130/84 (05/08/2009)  Prior 10 Yr Risk Heart Disease: 7 % (04/23/2009)  Labs Reviewed: K+: 4.5 (09/04/2008) Creat: : 0.8 (09/04/2008)   Chol: 167 (11/02/2007)   HDL: 48.6 (11/02/2007)   LDL: 100 (11/02/2007)   TG: 92 (11/02/2007)  Complete Medication List: 1)  Labetalol Hcl 200 Mg Tabs (Labetalol hcl) .... Take 1 tablet by mouth two times a day 2)  Valproic Acid 250 Mg Caps (Valproic acid) .... 4  a day 3)  Citalopram Hydrobromide 20 Mg Tabs (Citalopram hydrobromide) .... Take 1 tablet by mouth once a day 4)  Simvastatin 80 Mg Tabs (Simvastatin) .... Take 1 tablet by mouth once a day 5)  Citracal Plus Tabs (Multiple minerals-vitamins) .... Once daily 6)  Omega-3 Fish Oil 1000 Mg Caps (Omega-3 fatty acids) .... Two caps a day 7)  Cinnamon 500 Mg Caps (Cinnamon) .... Two caps a day 8)  Lorazepam 0.5 Mg Tabs (Lorazepam) .... Take 1 tablet by mouth three times a day and 4qhs 9)  Triamcinolone Acetonide 0.1 % Lotn (Triamcinolone acetonide) .... Apply to rash two times a day 10)  Tramadol Hcl 50 Mg Tabs (Tramadol hcl) .... One by mouth qid as needed for headache 11)  Hydroxyzine Hcl 10 Mg/64ml Syrp (Hydroxyzine hcl) .... 5-10 ml by mouth qid as needed for itching  Patient Instructions: 1)  Please schedule a follow-up appointment in 2 weeks. Prescriptions: HYDROXYZINE HCL 10 MG/5ML SYRP (HYDROXYZINE HCL) 5-10 ml by mouth QID as needed for itching  #6 ounces x 1   Entered and Authorized by:   Etta Grandchild MD   Signed by:   Etta Grandchild MD on 05/12/2009   Method used:   Print then Give to Patient   RxID:   [id number] TRAMADOL HCL  50 MG TABS (TRAMADOL HCL) One by mouth QID as needed for headache  #50 x 2   Entered and Authorized by:   Etta Grandchild MD   Signed by:   Etta Grandchild MD on 05/12/2009   Method used:   Print then Give to Patient   RxID:   [id number]

## 2010-02-09 NOTE — Progress Notes (Signed)
Summary: REFERRAL  Phone Note Call from Patient Call back at [facility] Phone [phone]   Summary of Call: Pt see's Dr Danise Edge for endo, Is this ok? Or does pt need to keep apt w/[facility]? Initial call taken by: Lamar Sprinkles, CMA,  November 16, 2009 2:24 PM  Follow-up for Phone Call        does not matter Follow-up by: Etta Grandchild MD,  November 16, 2009 2:29 PM  Additional Follow-up for Phone Call Additional follow up Details #1::        Pt informed, she will keep DR Laural Benes as ENDO, Please cancel pt's upcomming apt w/[facility]. Additional Follow-up by: Lamar Sprinkles, CMA,  November 16, 2009 4:03 PM    Additional Follow-up for Phone Call Additional follow up Details #2::    Appt cx'd in [facility]. Follow-up by: Verdell Face,  November 16, 2009 4:25 PM

## 2010-02-09 NOTE — Letter (Signed)
Summary: Results Follow-up Letter  [facility]  [address] [place], [place] [address]   Phone: [phone]  Fax: [phone]    05/21/2009  [address] [place], [place]  [address]  Dear Ms. Boehner,   The following are the results of your recent test(s):  Test     Result     Valproic acid   slightly low CBC       very mild anemia Liver/kidney   normal  _________________________________________________________  Please call for an appointment as directed _________________________________________________________ _________________________________________________________ _________________________________________________________  Sincerely,  Sanda Linger MD [facility]

## 2010-02-09 NOTE — Consult Note (Signed)
Summary: Danise Edge MD/[facility] MD/[facility]   Imported By: Lester Brownsdale 10/19/2009 07:46:07  _____________________________________________________________________  External Attachment:    Type:   Image     Comment:   External Document

## 2010-02-09 NOTE — Assessment & Plan Note (Signed)
Summary: dry cough-itchy skin-eyes itch--stc   Vital Signs:  Patient profile:   60 year old female Height:      65 inches Weight:      174 pounds BMI:     29.06 O2 Sat:      96 % on Room air Temp:     98.6 degrees F oral Pulse rate:   66 / minute Pulse rhythm:   regular Resp:     16 per minute BP sitting:   134 / 80  (left arm) Cuff size:   large  Vitals Entered By: Rock Nephew CMA (March 30, 2009 1:58 PM)  O2 Flow:  Room air CC: am dry cough, itchy skin w/ watery itchy eyes, URI symptoms, Hypertension Management Is Patient Diabetic? No   Primary Care Provider:  Etta Grandchild MD  CC:  am dry cough, itchy skin w/ watery itchy eyes, URI symptoms, and Hypertension Management.  History of Present Illness:  URI Symptoms      This is a 60 year old woman who presents with URI symptoms.  The symptoms began 1 week ago.  The severity is described as mild.  The patient reports nasal congestion and dry cough, but denies sore throat, productive cough, earache, and sick contacts.  The patient denies fever, stiff neck, dyspnea, wheezing, rash, vomiting, diarrhea, and use of an antipyretic.  The patient also reports seasonal symptoms and response to antihistamine.  The patient denies headache, muscle aches, and severe fatigue.  The patient denies the following risk factors for Strep sinusitis: unilateral facial pain, unilateral nasal discharge, poor response to decongestant, double sickening, tooth pain, Strep exposure, tender adenopathy, and absence of cough.    Hypertension History:      She denies headache, chest pain, palpitations, dyspnea with exertion, orthopnea, PND, peripheral edema, visual symptoms, neurologic problems, syncope, and side effects from treatment.  She notes no problems with any antihypertensive medication side effects.        Positive major cardiovascular risk factors include female age 61 years old or older, hyperlipidemia, and hypertension.  Negative major  cardiovascular risk factors include no history of diabetes, negative family history for ischemic heart disease, and non-tobacco-user status.        Further assessment for target organ damage reveals no history of ASHD, cardiac end-organ damage (CHF/LVH), stroke/TIA, peripheral vascular disease, renal insufficiency, or hypertensive retinopathy.     Preventive Screening-Counseling & Management  Alcohol-Tobacco     Alcohol drinks/day: 0     Smoking Status: never     Passive Smoke Exposure: no  Hep-HIV-STD-Contraception     HIV Risk: no      Drug Use:  no.    Medications Prior to Update: 1)  Labetalol Hcl 200 Mg Tabs (Labetalol Hcl) .... Take 1 Tablet By Mouth Two Times A Day 2)  Valproic Acid 250 Mg Caps (Valproic Acid) .... 4  A Day 3)  Citalopram Hydrobromide 20 Mg Tabs (Citalopram Hydrobromide) .... Take 1 Tablet By Mouth Once A Day 4)  Simvastatin 80 Mg Tabs (Simvastatin) .... Take 1 Tablet By Mouth Once A Day 5)  Citracal Plus  Tabs (Multiple Minerals-Vitamins) .... Once Daily 6)  Omega-3 Fish Oil 1000 Mg Caps (Omega-3 Fatty Acids) .... Two Caps A Day 7)  Cinnamon 500 Mg Caps (Cinnamon) .... Two Caps A Day 8)  Lorazepam 0.5 Mg Tabs (Lorazepam) .... Take 1 Tablet By Mouth Three Times A Day and 4qhs 9)  Maxalt-Mlt 10 Mg Tbdp (Rizatriptan Benzoate) .... Take  One As Neede For Headache  Current Medications (verified): 1)  Labetalol Hcl 200 Mg Tabs (Labetalol Hcl) .... Take 1 Tablet By Mouth Two Times A Day 2)  Valproic Acid 250 Mg Caps (Valproic Acid) .... 4  A Day 3)  Citalopram Hydrobromide 20 Mg Tabs (Citalopram Hydrobromide) .... Take 1 Tablet By Mouth Once A Day 4)  Simvastatin 80 Mg Tabs (Simvastatin) .... Take 1 Tablet By Mouth Once A Day 5)  Citracal Plus  Tabs (Multiple Minerals-Vitamins) .... Once Daily 6)  Omega-3 Fish Oil 1000 Mg Caps (Omega-3 Fatty Acids) .... Two Caps A Day 7)  Cinnamon 500 Mg Caps (Cinnamon) .... Two Caps A Day 8)  Lorazepam 0.5 Mg Tabs (Lorazepam) ....  Take 1 Tablet By Mouth Three Times A Day and 4qhs 9)  Maxalt-Mlt 10 Mg Tbdp (Rizatriptan Benzoate) .... Take One As Neede For Headache 10)  Tussionex Pennkinetic Er 8-10 Mg/53ml Lqcr (Chlorpheniramine-Hydrocodone) .... % Ml By Mouth Two Times A Day As Needed For Cough  Allergies (verified): 1)  ! Morphine  Past History:  Past Medical History: Reviewed history from 12/18/2008 and no changes required. Current Problems:  ELECTROCARDIOGRAM, ABNORMAL (ICD-794.31) HYPERTENSION, BENIGN ESSENTIAL (ICD-401.1) HYPERLIPIDEMIA (ICD-272.4) DIVERTICULOSIS, COLON (ICD-562.10) OVERACTIVE BLADDER (ICD-596.51) ANEMIA (ICD-285.9) OSTEOPENIA (ICD-733.90) FAMILY HISTORY DIABETES 1ST DEGREE RELATIVE (ICD-V18.0) FAMILY HISTORY OF CAD FEMALE 1ST DEGREE RELATIVE <50 (ICD-V17.3) HEADACHE (ICD-784.0) URINARY INCONTINENCE (ICD-788.30) BIPOLAR DISORDER UNSPECIFIED (ICD-296.80) ENCOUNTER FOR LONG-TERM USE OF OTHER MEDICATIONS (ICD-V58.69)  Past Surgical History: Reviewed history from 12/18/2008 and no changes required. Hysterectomy  Family History: Reviewed history from 12/18/2008 and no changes required. Family History of CAD Female 1st degree relative <50 Family History Diabetes 1st degree relative Family History Kidney disease Family History of Cardiovascular disorder  Social History: Reviewed history from 12/18/2008 and no changes required. Divorced Never Smoked Alcohol use-no Drug use-no Regular exercise-yes  Review of Systems  The patient denies anorexia, fever, weight loss, weight gain, chest pain, syncope, dyspnea on exertion, peripheral edema, headaches, hemoptysis, abdominal pain, suspicious skin lesions, and enlarged lymph nodes.   Derm:  Complains of itching; denies changes in color of skin, changes in nail beds, dryness, excessive perspiration, flushing, hair loss, insect bite(s), lesion(s), poor wound healing, and rash.  Physical Exam  General:  Well-developed,well-nourished,in no  acute distress; alert,appropriate and cooperative throughout examination Head:  Normocephalic and atraumatic without obvious abnormalities. No apparent alopecia or balding. Ears:  External ear exam shows no significant lesions or deformities.  Otoscopic examination reveals clear canals, tympanic membranes are intact bilaterally without bulging, retraction, inflammation or discharge. Hearing is grossly normal bilaterally. Nose:  External nasal examination shows no deformity or inflammation. Nasal mucosa are pink and moist without lesions or exudates. Mouth:  Oral mucosa and oropharynx without lesions or exudates.  Teeth in good repair. Neck:  supple, full ROM, and no masses.   Lungs:  Normal respiratory effort, chest expands symmetrically. Lungs are clear to auscultation, no crackles or wheezes. Heart:  Normal rate and regular rhythm. S1 and S2 normal without gallop, murmur, click, rub or other extra sounds. Abdomen:  Bowel sounds positive,abdomen soft and non-tender without masses, organomegaly or hernias noted. Msk:  No deformity or scoliosis noted of thoracic or lumbar spine.   Pulses:  R and L carotid,radial,femoral,dorsalis pedis and posterior tibial pulses are full and equal bilaterally Extremities:  No clubbing, cyanosis, edema, or deformity noted with normal full range of motion of all joints.   Neurologic:  No cranial nerve deficits noted. Station and gait  are normal. Plantar reflexes are down-going bilaterally. DTRs are symmetrical throughout. Sensory, motor and coordinative functions appear intact. Skin:  color normal, no rashes, no suspicious lesions, no ecchymoses, no petechiae, no purpura, no ulcerations, no edema, and decreased turgor.   Cervical Nodes:  no anterior cervical adenopathy and no posterior cervical adenopathy.   Axillary Nodes:  no R axillary adenopathy and no L axillary adenopathy.   Inguinal Nodes:  no R inguinal adenopathy and no L inguinal adenopathy.   Psych:   Oriented X3, memory intact for recent and remote, normally interactive, good eye contact, not anxious appearing, not depressed appearing, not agitated, and not suicidal.     Impression & Recommendations:  Problem # 1:  URI (ICD-465.9) Assessment New  this sounds viral and/or allergic Her updated medication list for this problem includes:    Tussionex Pennkinetic Er 8-10 Mg/52ml Lqcr (Chlorpheniramine-hydrocodone) .Marland Kitchen... % ml by mouth two times a day as needed for cough  Orders: Admin of Therapeutic Inj  intramuscular or subcutaneous (17510) Depo- Medrol 80mg  (J1040) Depo- Medrol 40mg  (J1030)  Instructed on symptomatic treatment. Call if symptoms persist or worsen.   Problem # 2:  HYPERTENSION, BENIGN ESSENTIAL (ICD-401.1) Assessment: Improved  Her updated medication list for this problem includes:    Labetalol Hcl 200 Mg Tabs (Labetalol hcl) .Marland Kitchen... Take 1 tablet by mouth two times a day  BP today: 134/80 Prior BP: 98/60 (02/02/2009)  Prior 10 Yr Risk Heart Disease: 9 % (09/30/2008)  Labs Reviewed: K+: 4.5 (09/04/2008) Creat: : 0.8 (09/04/2008)   Chol: 167 (11/02/2007)   HDL: 48.6 (11/02/2007)   LDL: 100 (11/02/2007)   TG: 92 (11/02/2007)  Complete Medication List: 1)  Labetalol Hcl 200 Mg Tabs (Labetalol hcl) .... Take 1 tablet by mouth two times a day 2)  Valproic Acid 250 Mg Caps (Valproic acid) .... 4  a day 3)  Citalopram Hydrobromide 20 Mg Tabs (Citalopram hydrobromide) .... Take 1 tablet by mouth once a day 4)  Simvastatin 80 Mg Tabs (Simvastatin) .... Take 1 tablet by mouth once a day 5)  Citracal Plus Tabs (Multiple minerals-vitamins) .... Once daily 6)  Omega-3 Fish Oil 1000 Mg Caps (Omega-3 fatty acids) .... Two caps a day 7)  Cinnamon 500 Mg Caps (Cinnamon) .... Two caps a day 8)  Lorazepam 0.5 Mg Tabs (Lorazepam) .... Take 1 tablet by mouth three times a day and 4qhs 9)  Maxalt-mlt 10 Mg Tbdp (Rizatriptan benzoate) .... Take one as neede for headache 10)   Tussionex Pennkinetic Er 8-10 Mg/56ml Lqcr (Chlorpheniramine-hydrocodone) .... % ml by mouth two times a day as needed for cough  Hypertension Assessment/Plan:      The patient's hypertensive risk group is category B: At least one risk factor (excluding diabetes) with no target organ damage.  Her calculated 10 year risk of coronary heart disease is 11 %.  Today's blood pressure is 134/80.  Her blood pressure goal is < 140/90.  Patient Instructions: 1)  Please schedule a follow-up appointment in 1 month. 2)  Get plenty of rest, drink lots of clear liquids, and use Tylenol or Ibuprofen for fever and comfort. Return in 7-10 days if you're not better:sooner if you're feeling worse. Prescriptions: TUSSIONEX PENNKINETIC ER 8-10 MG/5ML LQCR (CHLORPHENIRAMINE-HYDROCODONE) % ml by mouth two times a day as needed for cough  #4 ounces x 0   Entered and Authorized by:   Etta Grandchild MD   Signed by:   Etta Grandchild MD on 03/30/2009  Method used:   Print then Give to Patient   RxID:   [id number]    Medication Administration  Injection # 1:    Medication: Depo- Medrol 40mg     Diagnosis: URI (ICD-465.9)    Route: IM    Site: L deltoid    Exp Date: 11/2011    Lot #: [id number]    Mfr: pfizer    Patient tolerated injection without complications    Given by: Rock Nephew CMA (March 30, 2009 2:15 PM)  Injection # 2:    Medication: Depo- Medrol 80mg     Diagnosis: URI (ICD-465.9)    Route: IM    Site: L deltoid    Exp Date: 11/2011    Lot #: [id number]    Mfr: pfizer    Patient tolerated injection without complications    Given by: Rock Nephew CMA (March 30, 2009 2:15 PM)  Orders Added: 1)  Admin of Therapeutic Inj  intramuscular or subcutaneous [96372] 2)  Depo- Medrol 80mg  [J1040] 3)  Depo- Medrol 40mg  [J1030] 4)  Est. Patient Level IV [14782]

## 2010-02-09 NOTE — Assessment & Plan Note (Signed)
Summary: ROV   Visit Type:  Follow-up Primary Provider:  Etta Grandchild MD  CC:  No complaints.  History of Present Illness: This is a 60 year-old woman with ventricular noncompaction (incidental finding on cardiac MR as part of a research trial), presenting today for follow-up evaluation.  The pt has undergone a stress echo and Holter monitor since her initial eval here last month. She is doing well at present and enies chest pain, palps, or lightheadedness. Exertional dyspnea is stable.  Current Medications (verified): 1)  Labetalol Hcl 200 Mg Tabs (Labetalol Hcl) .... Take 1 Tablet By Mouth Two Times A Day 2)  Valproic Acid 250 Mg Caps (Valproic Acid) .... 4  A Day 3)  Citalopram Hydrobromide 20 Mg Tabs (Citalopram Hydrobromide) .... Take 1 Tablet By Mouth Once A Day 4)  Simvastatin 80 Mg Tabs (Simvastatin) .... Take 1 Tablet By Mouth Once A Day 5)  Citracal Plus  Tabs (Multiple Minerals-Vitamins) .... Once Daily 6)  Omega-3 Fish Oil 1000 Mg Caps (Omega-3 Fatty Acids) .... Two Caps A Day 7)  Cinnamon 500 Mg Caps (Cinnamon) .... Two Caps A Day 8)  Lorazepam 0.5 Mg Tabs (Lorazepam) .... Take 1 Tablet By Mouth Three Times A Day and 4qhs 9)  Maxalt-Mlt 10 Mg Tbdp (Rizatriptan Benzoate) .... Take One As Neede For Headache  Allergies: 1)  ! Morphine  Past History:  Past medical history reviewed for relevance to current acute and chronic problems.  Past Medical History: Reviewed history from 12/18/2008 and no changes required. Current Problems:  ELECTROCARDIOGRAM, ABNORMAL (ICD-794.31) HYPERTENSION, BENIGN ESSENTIAL (ICD-401.1) HYPERLIPIDEMIA (ICD-272.4) DIVERTICULOSIS, COLON (ICD-562.10) OVERACTIVE BLADDER (ICD-596.51) ANEMIA (ICD-285.9) OSTEOPENIA (ICD-733.90) FAMILY HISTORY DIABETES 1ST DEGREE RELATIVE (ICD-V18.0) FAMILY HISTORY OF CAD FEMALE 1ST DEGREE RELATIVE <50 (ICD-V17.3) HEADACHE (ICD-784.0) URINARY INCONTINENCE (ICD-788.30) BIPOLAR DISORDER UNSPECIFIED  (ICD-296.80) ENCOUNTER FOR LONG-TERM USE OF OTHER MEDICATIONS (ICD-V58.69)  Vital Signs:  Patient profile:   60 year old female Height:      65 inches Weight:      178.25 pounds BMI:     29.77 Pulse rate:   68 / minute Pulse rhythm:   regular Resp:     18 per minute BP sitting:   98 / 60  (left arm) Cuff size:   large  Vitals Entered By: Vikki Ports (February 02, 2009 1:43 PM)  Physical Exam  General:  Pt is alert and oriented, in no acute distress. HEENT: normal Neck: normal carotid upstrokes without bruits, JVP normal Lungs: CTA CV: RRR without murmur or gallop Abd: soft, NT, positive BS, no bruit, no organomegaly Ext: no clubbing, cyanosis, or edema. peripheral pulses 2+ and equal Skin: warm and dry without rash    Stress Echocardiogram  Procedure date:  01/22/2009  Findings:      Study Conclusions Stress ECG conclusions: No evidence for ischemia by stress ECG. Impressions:  - No evidence for ischemia by ECG or echo. However, this was a   submaximal study (patient reached only 72% MPHR) so sensitivity is   limited. Poor exercise tolerance. The patient has a history of LV   noncompaction by MRI. This was not noted on echo though MRI is   probably more sensitive.  Impression & Recommendations:  Problem # 1:  OTHER PRIMARY CARDIOMYOPATHIES (ICD-425.4) The pt is stable with minimal symptoms. Noncompaction was not appreciated on her echo study. Stress echo was negative at level of exercise achieved (less than 85% predicted max HR). Holter reviewed and shows few PVC's and pSVT - short  runs. Recommend continue beta-blocker and follow-up cardiac MR in one year. Will see back in office in one year also.  Her updated medication list for this problem includes:    Labetalol Hcl 200 Mg Tabs (Labetalol hcl) .Marland Kitchen... Take 1 tablet by mouth two times a day  Patient Instructions: 1)  Your physician wants you to follow-up in:   1 YEAR. You will receive a reminder letter in the  mail two months in advance. If you don't receive a letter, please call our office to schedule the follow-up appointment. 2)  Your physician has requested that you have a cardiac MRI in 1 YEAR.  Cardiac MRI uses a computer to create images of your heart as it's beating, producing both still and moving pictures of your heart and major blood vessels. For further information please visit  [email or web address]  Please follow the instruction sheet given to you today for more information. 3)  Your physician recommends that you continue on your current medications as directed. Please refer to the Current Medication list given to you today.

## 2010-02-09 NOTE — Assessment & Plan Note (Signed)
Summary: HEADACHES/NWS   Vital Signs:  Patient profile:   60 year old female Height:      65 inches Weight:      174 pounds BMI:     29.06 O2 Sat:      97 % on Room air Temp:     98.2 degrees F oral Pulse rate:   66 / minute Pulse rhythm:   regular Resp:     16 per minute BP sitting:   130 / 84  (left arm) Cuff size:   large  Vitals Entered By: Rock Nephew CMA (May 08, 2009 2:17 PM)  Nutrition Counseling: Patient's BMI is greater than 25 and therefore counseled on weight management options.  O2 Flow:  Room air  Primary Care Provider:  Etta Grandchild MD  CC:  Headaches.  History of Present Illness:  Headaches      This is a 60 year old woman who presents with Headaches.  The symptoms began 8-12 hrs ago.  On a scale of 1 to 10, the intensity is described as a 3.  The patient reports photophobia and phonophobia, but denies nausea, vomiting, sweats, tearing of eyes, nasal congestion, sinus pain, and sinus pressure.  The headache is described as constant and band-like.  The location of the pain is first on left and then bilateral.  The patient denies the following high-risk features: fever, neck pain/stiffness, vision loss or change, focal weakness, altered mental status, rash, trauma, pain worse with exertion, new type of headache, age >50 years, immunosuppression, concomitant infection, and anticoagulation use.  The headaches are precipitated by change in weather.  Prior treatment has included a triptan.    Preventive Screening-Counseling & Management  Alcohol-Tobacco     Alcohol drinks/day: 0     Smoking Status: never     Passive Smoke Exposure: no  Hep-HIV-STD-Contraception     Hepatitis Risk: no risk noted     HIV Risk: no     STD Risk: no risk noted      Drug Use:  no.    Medications Prior to Update: 1)  Labetalol Hcl 200 Mg Tabs (Labetalol Hcl) .... Take 1 Tablet By Mouth Two Times A Day 2)  Valproic Acid 250 Mg Caps (Valproic Acid) .... 4  A Day 3)   Citalopram Hydrobromide 20 Mg Tabs (Citalopram Hydrobromide) .... Take 1 Tablet By Mouth Once A Day 4)  Simvastatin 80 Mg Tabs (Simvastatin) .... Take 1 Tablet By Mouth Once A Day 5)  Citracal Plus  Tabs (Multiple Minerals-Vitamins) .... Once Daily 6)  Omega-3 Fish Oil 1000 Mg Caps (Omega-3 Fatty Acids) .... Two Caps A Day 7)  Cinnamon 500 Mg Caps (Cinnamon) .... Two Caps A Day 8)  Lorazepam 0.5 Mg Tabs (Lorazepam) .... Take 1 Tablet By Mouth Three Times A Day and 4qhs 9)  Lodrane 24 12 Mg Xr24h-Cap (Brompheniramine Maleate) .Marland Kitchen.. 1-2 By Mouth Once Daily For Rash/itching 10)  Triamcinolone Acetonide 0.1 % Lotn (Triamcinolone Acetonide) .... Apply To Rash Two Times A Day  Current Medications (verified): 1)  Labetalol Hcl 200 Mg Tabs (Labetalol Hcl) .... Take 1 Tablet By Mouth Two Times A Day 2)  Valproic Acid 250 Mg Caps (Valproic Acid) .... 4  A Day 3)  Citalopram Hydrobromide 20 Mg Tabs (Citalopram Hydrobromide) .... Take 1 Tablet By Mouth Once A Day 4)  Simvastatin 80 Mg Tabs (Simvastatin) .... Take 1 Tablet By Mouth Once A Day 5)  Citracal Plus  Tabs (Multiple Minerals-Vitamins) .... Once Daily  6)  Omega-3 Fish Oil 1000 Mg Caps (Omega-3 Fatty Acids) .... Two Caps A Day 7)  Cinnamon 500 Mg Caps (Cinnamon) .... Two Caps A Day 8)  Lorazepam 0.5 Mg Tabs (Lorazepam) .... Take 1 Tablet By Mouth Three Times A Day and 4qhs 9)  Triamcinolone Acetonide 0.1 % Lotn (Triamcinolone Acetonide) .... Apply To Rash Two Times A Day  Allergies (verified): 1)  ! Morphine  Past History:  Past Medical History: Reviewed history from 12/18/2008 and no changes required. Current Problems:  ELECTROCARDIOGRAM, ABNORMAL (ICD-794.31) HYPERTENSION, BENIGN ESSENTIAL (ICD-401.1) HYPERLIPIDEMIA (ICD-272.4) DIVERTICULOSIS, COLON (ICD-562.10) OVERACTIVE BLADDER (ICD-596.51) ANEMIA (ICD-285.9) OSTEOPENIA (ICD-733.90) FAMILY HISTORY DIABETES 1ST DEGREE RELATIVE (ICD-V18.0) FAMILY HISTORY OF CAD FEMALE 1ST DEGREE  RELATIVE <50 (ICD-V17.3) HEADACHE (ICD-784.0) URINARY INCONTINENCE (ICD-788.30) BIPOLAR DISORDER UNSPECIFIED (ICD-296.80) ENCOUNTER FOR LONG-TERM USE OF OTHER MEDICATIONS (ICD-V58.69)  Past Surgical History: Reviewed history from 12/18/2008 and no changes required. Hysterectomy  Family History: Reviewed history from 12/18/2008 and no changes required. Family History of CAD Female 1st degree relative <50 Family History Diabetes 1st degree relative Family History Kidney disease Family History of Cardiovascular disorder  Social History: Reviewed history from 12/18/2008 and no changes required. Divorced Never Smoked Alcohol use-no Drug use-no Regular exercise-yes  Review of Systems  The patient denies anorexia, fever, weight loss, chest pain, peripheral edema, abdominal pain, transient blindness, difficulty walking, and depression.    Physical Exam  General:  Well-developed,well-nourished,in no acute distress; alert,appropriate and cooperative throughout examination Head:  Normocephalic and atraumatic without obvious abnormalities. No apparent alopecia or balding. Eyes:  vision grossly intact, pupils equal, pupils round, and pupils reactive to light.   Mouth:  Oral mucosa and oropharynx without lesions or exudates.  Teeth in good repair. Neck:  supple, full ROM, and no masses.   Lungs:  Normal respiratory effort, chest expands symmetrically. Lungs are clear to auscultation, no crackles or wheezes. Heart:  Normal rate and regular rhythm. S1 and S2 normal without gallop, murmur, click, rub or other extra sounds. Abdomen:  soft, non-tender, normal bowel sounds, no distention, no masses, no guarding, no rigidity, no hepatomegaly, and no splenomegaly.   Msk:  normal ROM, no joint tenderness, no joint swelling, no joint warmth, no redness over joints, no joint deformities, no joint instability, no crepitation, and no muscle atrophy.   Pulses:  R and L carotid,radial,femoral,dorsalis pedis  and posterior tibial pulses are full and equal bilaterally Extremities:  No clubbing, cyanosis, edema, or deformity noted with normal full range of motion of all joints.   Neurologic:  No cranial nerve deficits noted. Station and gait are normal. Plantar reflexes are down-going bilaterally. DTRs are symmetrical throughout. Sensory, motor and coordinative functions appear intact. Skin:  Intact without suspicious lesions or rashes Cervical Nodes:  No lymphadenopathy noted Psych:  Cognition and judgment appear intact. Alert and cooperative with normal attention span and concentration. No apparent delusions, illusions, hallucinations   Impression & Recommendations:  Problem # 1:  COMMON MIGRAINE (ICD-346.10) Assessment New  will try IM steroids since Maxalt did not help Her updated medication list for this problem includes:    Labetalol Hcl 200 Mg Tabs (Labetalol hcl) .Marland Kitchen... Take 1 tablet by mouth two times a day  Headache diary reviewed.  Complete Medication List: 1)  Labetalol Hcl 200 Mg Tabs (Labetalol hcl) .... Take 1 tablet by mouth two times a day 2)  Valproic Acid 250 Mg Caps (Valproic acid) .... 4  a day 3)  Citalopram Hydrobromide 20 Mg Tabs (Citalopram hydrobromide) .Marland KitchenMarland KitchenMarland Kitchen  Take 1 tablet by mouth once a day 4)  Simvastatin 80 Mg Tabs (Simvastatin) .... Take 1 tablet by mouth once a day 5)  Citracal Plus Tabs (Multiple minerals-vitamins) .... Once daily 6)  Omega-3 Fish Oil 1000 Mg Caps (Omega-3 fatty acids) .... Two caps a day 7)  Cinnamon 500 Mg Caps (Cinnamon) .... Two caps a day 8)  Lorazepam 0.5 Mg Tabs (Lorazepam) .... Take 1 tablet by mouth three times a day and 4qhs 9)  Triamcinolone Acetonide 0.1 % Lotn (Triamcinolone acetonide) .... Apply to rash two times a day  Other Orders: Admin of Therapeutic Inj  intramuscular or subcutaneous (16109) Depo- Medrol 40mg  (J1030) Depo- Medrol 80mg  (J1040)  Patient Instructions: 1)  Please schedule a follow-up appointment in 3-5  days.   Medication Administration  Injection # 1:    Medication: Depo- Medrol 80mg     Diagnosis: CONTACT DERMATITIS&OTH ECZEMA DUE OTH SPEC AGENT (ICD-692.89)    Route: IM    Site: L deltoid    Exp Date: 11/2011    Lot #: [id number]    Mfr: pfizer    Patient tolerated injection without complications    Given by: Rock Nephew CMA (May 08, 2009 2:47 PM)  Injection # 2:    Medication: Depo- Medrol 40mg     Diagnosis: CONTACT DERMATITIS&OTH ECZEMA DUE OTH SPEC AGENT (ICD-692.89)    Route: IM    Site: R deltoid    Exp Date: 11/2011    Lot #: [id number]    Mfr: pfizer    Patient tolerated injection without complications    Given by: Rock Nephew CMA (May 08, 2009 2:47 PM)  Orders Added: 1)  Admin of Therapeutic Inj  intramuscular or subcutaneous [96372] 2)  Depo- Medrol 40mg  [J1030] 3)  Depo- Medrol 80mg  [J1040] 4)  Est. Patient Level IV [60454]

## 2010-02-10 ENCOUNTER — Encounter: Payer: Self-pay | Admitting: Cardiovascular Disease

## 2010-02-10 LAB — BASIC METABOLIC PANEL
CO2: 27 mEq/L (ref 19–32)
Calcium: 9.3 mg/dL (ref 8.4–10.5)
Chloride: 94 mEq/L — ABNORMAL LOW (ref 96–112)
Creatinine, Ser: 0.7 mg/dL (ref 0.4–1.2)
GFR calc Af Amer: >60 mL/min (ref >60)
Sodium: 132 mEq/L — ABNORMAL LOW (ref 135–145)

## 2010-02-10 LAB — CBC
Hemoglobin: 12.6 g/dL (ref 12.0–15.0)
MCH: 32.4 pg (ref 26.0–34.0)
Platelets: 157 10*3/uL (ref 150–400)
RBC: 3.89 MIL/uL (ref 3.87–5.11)

## 2010-02-10 NOTE — H&P (Signed)
NAME:  Donna Sexton, Donna Sexton NO.:  [id number]  MEDICAL RECORD NO.:  [id number]          PATIENT TYPE:  EMS  LOCATION:  [facility]                         FACILITY:  [facility]  PHYSICIAN:  Eduard Clos, MDDATE OF BIRTH:  [birth year]  DATE OF ADMISSION:  02/01/2010 DATE OF DISCHARGE:                             HISTORY & PHYSICAL   PRIMARY CARE PHYSICIAN:  Not known; per records, could be probably Dr. Felicity Coyer of [place].  CHIEF COMPLAINT:  Altered mental status.  SOURCE OF INFORMATION:  History is obtained from ER physician and previous charts.  HISTORY OF PRESENT ILLNESS:  A 60 year old female who, per the E-chart, has had a history of hypertension, hyperlipidemia, bipolar disorder, overactive bladder, migraine headaches, who was found to be confused and was in a bank, wherein she was not knowing what she was doing.  EMS was called and brought her to the ER.  In the ER the patient is talking, but does not know where she is or who brought her, and she does not complain of any pain.  At this time CT head and most of the labs are negative. The patient has been admitted for altered mental status of unknown cause.  On exam the patient does not complain of any chest pain or shortness of breath, or any pain in the abdomen, any nausea, vomiting, or diarrhea. She did have some vomiting in the bank where she was found, as per the ER physician.  She is able to move upper and lower extremities.  She did not lose consciousness.  At this time the patient does not complain of any headache.  The patient does not know where she is and does not know the date and time.  She knows her name, and she states that her ex- husband brought her here.  PAST MEDICAL HISTORY:  Per chart, hypertension, hyperlipidemia, overactive bladder, osteopenia, bipolar disorder, migraines, and chronic diverticulosis.  It also shows that in November 2011 she had an EGD.  At that time there was documentation  that the patient had hyponatremia, which could be from SIADH, and also was mentioned that valproic acid could be causing it.  MEDICATIONS PRIOR TO ADMISSION:  Not known.  When I asked the patient, the patient states she takes labetalol and Ativan.  ALLERGIES:  No known drug allergies.  FAMILY HISTORY:  Not known.  SOCIAL HISTORY:  Not known.  REVIEW OF SYSTEMS:  As per History of Presenting Illness.  Nothing else significant.  PHYSICAL EXAMINATION:  GENERAL:  The patient examined at bedside, not in acute distress. VITAL SIGNS:  Blood pressure 130/80, pulse 68 per minute, temperature 97.1, respirations 18, O2 sat 98%. HEAD AND NECK:  Anicteric.  No pallor.  No facial asymmetry.  Tongue is midline.  No neck rigidity.  There is no obvious scalp injury at this time. CHEST:  Bilateral air entry present, no rhonchi, no crepitation. HEART:  S1 and S2 heard. ABDOMEN:  Soft, nontender.  Bowel sounds heard. CNS:  The patient is alert, awake, oriented to her name.  Does not know where she is, does not recall the date and time.  Able to move upper and lower extremities. EXTREMITIES:  Peripheral pulses felt.  No edema.  LABORATORY DATA:  EKG shows normal sinus rhythm with beats around 64 beats per minute and poor R-wave progression, with nonspecific ST-T changes.  CT of the head without contrast shows nothing acute.  Mild small vessel ischemic changes, atrophy, and right maxillary sinus inflammation.  Chest x-ray shows left base atelectasis.  CBC:  WBC 9.6, hemoglobin is 12, hematocrit is 34.6, platelets 125,000.  PT/INR 13.8 and 1.  Complete metabolic panel:  Sodium 141, potassium 5.3, chloride 108, carbon dioxide 28, glucose 126, creatinine 0.9. Total bilirubin is 0.3, alkaline phosphatase 40, AST 20, ALT 14, total protein 5.6, albumin 3.2.  Calcium 8.7.  Lipase 22.  Acetaminophen level less than 10.  Salicylate less than 4.  Drug screen is positive for benzo.  UA is negative for  nitrite, leukocyte, WBC 0.  Urine glucose negative, ketones 15.  The patient's anion gap is 5.  ASSESSMENT: 1. Altered mental status, unclear etiology. 2. History of bipolar disorder. 3. History of hypertension. 4. Mild hyperkalemia. 5. Mild thrombocytopenia. 6. History of anxiety. 7. History of hyperlipidemia.  PLAN: 1. At this time will admit the patient to telemetry. 2. For her altered mental status, at this time I am going to check     ammonia level.  Per the chart, it looks like the patient may have     been on valproic acid and Tegretol; I am going to check both of the     levels to see if they are high.  Repeat LFTs in the a.m. again.  I     am going to get an MRI of the brain.  If the MRI of the brain and     labs are all normal, then we may have to get a psychiatric consult.     At this time also I am going to order an EEG. 3. We need to find her home medication to see if any of them can be a     cause. 4. The patient has mild hyperkalemia.  I am going to repeat BMET again     at around 4 a.m. to make sure it is not going up. 5. Further recommendation based on tests ordered, clinical course, and     once we can get the whole medication list, which may be also a     positive reason.  Also want to the family to find the     exact reason.     Eduard Clos, MD     ANK/MEDQ  D:  02/01/2010  T:  02/01/2010  Job:  [id number]  Electronically Signed by Midge Minium MD on 02/10/2010 10:16:44 AM

## 2010-02-11 ENCOUNTER — Inpatient Hospital Stay (HOSPITAL_COMMUNITY)
Admission: AD | Admit: 2010-02-11 | Discharge: 2010-02-16 | DRG: 885 | Disposition: A | Discharge status: Home / Self Care | Payer: 59 | Source: Ambulatory Visit | Attending: Psychiatry | Admitting: Psychiatry

## 2010-02-11 ENCOUNTER — Encounter: Payer: Self-pay | Admitting: Internal Medicine

## 2010-02-11 DIAGNOSIS — N318 Other neuromuscular dysfunction of bladder: Secondary | ICD-10-CM

## 2010-02-11 DIAGNOSIS — G43909 Migraine, unspecified, not intractable, without status migrainosus: Secondary | ICD-10-CM

## 2010-02-11 DIAGNOSIS — K5730 Diverticulosis of large intestine without perforation or abscess without bleeding: Secondary | ICD-10-CM

## 2010-02-11 DIAGNOSIS — F319 Bipolar disorder, unspecified: Principal | ICD-10-CM

## 2010-02-11 DIAGNOSIS — E785 Hyperlipidemia, unspecified: Secondary | ICD-10-CM

## 2010-02-11 DIAGNOSIS — F411 Generalized anxiety disorder: Secondary | ICD-10-CM

## 2010-02-11 DIAGNOSIS — M949 Disorder of cartilage, unspecified: Secondary | ICD-10-CM

## 2010-02-11 DIAGNOSIS — K219 Gastro-esophageal reflux disease without esophagitis: Secondary | ICD-10-CM

## 2010-02-11 DIAGNOSIS — F1110 Opioid abuse, uncomplicated: Secondary | ICD-10-CM

## 2010-02-11 DIAGNOSIS — F1310 Sedative, hypnotic or anxiolytic abuse, uncomplicated: Secondary | ICD-10-CM

## 2010-02-11 DIAGNOSIS — M899 Disorder of bone, unspecified: Secondary | ICD-10-CM

## 2010-02-11 DIAGNOSIS — I672 Cerebral atherosclerosis: Secondary | ICD-10-CM

## 2010-02-11 NOTE — Assessment & Plan Note (Signed)
Summary: COUGH/NWS   Vital Signs:  Patient profile:   60 year old female Menstrual status:  hysterectomy Height:      65 inches Weight:      209 pounds BMI:     34.91 O2 Sat:      96 % on Room air Temp:     98.7 degrees F oral Pulse rate:   80 / minute Pulse rhythm:   regular Resp:     16 per minute BP sitting:   130 / 90  (left arm) Cuff size:   large  Vitals Entered By: Rock Nephew CMA (December 31, 2009 9:53 AM)  Nutrition Counseling: Patient's BMI is greater than 25 and therefore counseled on weight management options.  O2 Flow:  Room air CC: Patient c/o cough, headache and throat discomfort x 2 months Is Patient Diabetic? No Pain Assessment Patient in pain? no        Primary Care Provider:  Etta Grandchild MD  CC:  Patient c/o cough and headache and throat discomfort x 2 months.  History of Present Illness: She returns and tells me that she has been feeling poorly for about 2 months with a gasping/choking sensation when she lays down to sleep. She has been told that she snores heavily and has had witnessed apnea in her sleep. She has also had some DOE and a mild, NP cough but no CP or edema.  Preventive Screening-Counseling & Management  Alcohol-Tobacco     Alcohol drinks/day: 0     Alcohol Counseling: not indicated; patient does not drink     Smoking Status: never     Passive Smoke Exposure: no     Tobacco Counseling: not indicated; no tobacco use  Hep-HIV-STD-Contraception     Hepatitis Risk: no risk noted     HIV Risk: no     STD Risk: no risk noted      Drug Use:  no.    Clinical Review Panels:  Prevention   Last Mammogram:  ASSESSMENT: Negative - BI-RADS 1^MM DIGITAL SCREENING W/ IMPLANTS (11/19/2007)   Last Pap Smear:  done (09/11/2007)   Last Colonoscopy:  done (01/11/2003)  Immunizations   Last Tetanus Booster:  done (01/11/2004)   Last Flu Vaccine:  Historical (09/10/2009)  Lipid Management   Cholesterol:  167 (11/02/2007)   LDL  (bad choesterol):  100 (11/02/2007)   HDL (good cholesterol):  48.6 (11/02/2007)  Diabetes Management   Creatinine:  0.7 (10/09/2009)   Last Flu Vaccine:  Historical (09/10/2009)  CBC   WBC:  6.7 (10/09/2009)   RBC:  3.86 (10/09/2009)   Hgb:  12.9 (10/09/2009)   Hct:  36.5 (10/09/2009)   Platelets:  206.0 (10/09/2009)   MCV  94.7 (10/09/2009)   MCHC  35.3 (10/09/2009)   RDW  12.8 (10/09/2009)   PMN:  63.6 (10/09/2009)   Lymphs:  18.7 (10/09/2009)   Monos:  15.6 (10/09/2009)   Eosinophils:  1.9 (10/09/2009)   Basophil:  0.2 (10/09/2009)  Complete Metabolic Panel   Glucose:  105 (10/09/2009)   Sodium:  123 (10/09/2009)   Potassium:  5.4 (10/09/2009)   Chloride:  89 (10/09/2009)   CO2:  27 (10/09/2009)   BUN:  17 (10/09/2009)   Creatinine:  0.7 (10/09/2009)   Albumin:  4.2 (10/09/2009)   Total Protein:  6.7 (10/09/2009)   Calcium:  9.5 (10/09/2009)   Total Bili:  1.0 (10/09/2009)   Alk Phos:  45 (10/09/2009)   SGPT (ALT):  17 (10/09/2009)   SGOT (AST):  23 (10/09/2009)   Medications Prior to Update: 1)  Labetalol Hcl 200 Mg Tabs (Labetalol Hcl) .... Take 1 Tablet By Mouth Two Times A Day 2)  Citalopram Hydrobromide 20 Mg Tabs (Citalopram Hydrobromide) .... Take 1 Tablet By Mouth Once A Day 3)  Simvastatin 80 Mg Tabs (Simvastatin) .... Take 1 Tablet By Mouth Once A Day 4)  Citracal Plus  Tabs (Multiple Minerals-Vitamins) .... Once Daily 5)  Omega-3 Fish Oil 1000 Mg Caps (Omega-3 Fatty Acids) .... Two Caps A Day 6)  Cinnamon 500 Mg Caps (Cinnamon) .... Two Caps A Day 7)  Lorazepam 0.5 Mg Tabs (Lorazepam) .... Take 1 Tablet By Mouth Three Times A Day and 4qhs 8)  Hydroxyzine Hcl 10 Mg/63ml Syrp (Hydroxyzine Hcl) .... 5-10 Ml By Mouth Qid As Needed For Itching 9)  Nexium 40 Mg Cpdr (Esomeprazole Magnesium) .Marland Kitchen.. 1 By Mouth Once Daily 10)  Promethazine Hcl 25 Mg Tabs (Promethazine Hcl) .Marland Kitchen.. 1 By Mouth Every 4 Hours As Needed For Nausea  Current Medications (verified): 1)   Labetalol Hcl 200 Mg Tabs (Labetalol Hcl) .... Take 1 Tablet By Mouth Two Times A Day 2)  Citalopram Hydrobromide 20 Mg Tabs (Citalopram Hydrobromide) .... Take 1 Tablet By Mouth Once A Day 3)  Simvastatin 80 Mg Tabs (Simvastatin) .... Take 1 Tablet By Mouth Once A Day 4)  Citracal Plus  Tabs (Multiple Minerals-Vitamins) .... Once Daily 5)  Omega-3 Fish Oil 1000 Mg Caps (Omega-3 Fatty Acids) .... Two Caps A Day 6)  Cinnamon 500 Mg Caps (Cinnamon) .... Two Caps A Day 7)  Lorazepam 0.5 Mg Tabs (Lorazepam) .... Take 1 Tablet By Mouth Three Times A Day and 4qhs 8)  Hydroxyzine Hcl 10 Mg/4ml Syrp (Hydroxyzine Hcl) .... 5-10 Ml By Mouth Qid As Needed For Itching 9)  Nexium 40 Mg Cpdr (Esomeprazole Magnesium) .Marland Kitchen.. 1 By Mouth Once Daily 10)  Promethazine Hcl 25 Mg Tabs (Promethazine Hcl) .Marland Kitchen.. 1 By Mouth Every 4 Hours As Needed For Nausea 11)  Divalproex Sodium 500 Mg Xr24h-Tab (Divalproex Sodium) .... 3 Qhs  Allergies (verified): 1)  ! Morphine  Past History:  Past Medical History: Last updated: 10/09/2009 HYPERTENSION, BENIGN ESSENTIAL (ICD-401.1) HYPERLIPIDEMIA (ICD-272.4) DIVERTICULOSIS, COLON (ICD-562.10) OVERACTIVE BLADDER (ICD-596.51) ANEMIA (ICD-285.9) OSTEOPENIA (ICD-733.90) HEADACHE (ICD-784.0) BIPOLAR DISORDER UNSPECIFIED (ICD-296.80)  GI - [facility] (not going back) neuro - [facility] (not going back)  Past Surgical History: Last updated: 12/18/2008 Hysterectomy  Family History: Last updated: 12/18/2008 Family History of CAD Female 1st degree relative <50 Family History Diabetes 1st degree relative Family History Kidney disease Family History of Cardiovascular disorder  Social History: Last updated: 12/18/2008 Divorced Never Smoked Alcohol use-no Drug use-no Regular exercise-yes  Risk Factors: Alcohol Use: 0 (12/31/2009) Exercise: yes (10/29/2007)  Risk Factors: Smoking Status: never (12/31/2009) Passive Smoke Exposure: no (12/31/2009)  Family History: Reviewed  history from 12/18/2008 and no changes required. Family History of CAD Female 1st degree relative <50 Family History Diabetes 1st degree relative Family History Kidney disease Family History of Cardiovascular disorder  Social History: Reviewed history from 12/18/2008 and no changes required. Divorced Never Smoked Alcohol use-no Drug use-no Regular exercise-yes  Review of Systems       The patient complains of weight gain, dyspnea on exertion, and prolonged cough.  The patient denies anorexia, fever, weight loss, hoarseness, chest pain, syncope, peripheral edema, headaches, hemoptysis, abdominal pain, melena, hematochezia, severe indigestion/heartburn, hematuria, muscle weakness, suspicious skin lesions, unusual weight change, abnormal bleeding, and enlarged lymph nodes.   CV:  Complains  of difficulty breathing at night, difficulty breathing while lying down, and shortness of breath with exertion; denies bluish discoloration of lips or nails, chest pain or discomfort, fainting, fatigue, leg cramps with exertion, lightheadness, near fainting, palpitations, swelling of feet, swelling of hands, and weight gain. Resp:  Complains of cough, excessive snoring, and hypersomnolence; denies chest discomfort, chest pain with inspiration, coughing up blood, morning headaches, pleuritic, sputum productive, and wheezing. Endo:  Denies cold intolerance, excessive hunger, excessive thirst, excessive urination, heat intolerance, polyuria, and weight change.  Physical Exam  General:  alert, well-developed, well-nourished, well-hydrated, appropriate dress, normal appearance, healthy-appearing, cooperative to examination, good hygiene, and overweight-appearing.   Head:  normocephalic, atraumatic, no abnormalities observed, and no abnormalities palpated.   Eyes:  vision grossly intact, pupils equal, pupils round, pupils reactive to light, and no injection.   Ears:  R ear normal and L ear normal.   Nose:  External  nasal examination shows no deformity or inflammation. Nasal mucosa are pink and moist without lesions or exudates. Mouth:  Oral mucosa and oropharynx without lesions or exudates.  Teeth in good repair. Neck:  supple, full ROM, no masses, no thyromegaly, no thyroid nodules or tenderness, no JVD, normal carotid upstroke, no carotid bruits, no cervical lymphadenopathy, and no neck tenderness.   Chest Wall:  no deformities, no tenderness, and no mass.   Lungs:  normal respiratory effort, no intercostal retractions, no accessory muscle use, normal breath sounds, no dullness, no fremitus, no crackles, and no wheezes.   Heart:  normal rate, regular rhythm, no murmur, no gallop, no rub, and no JVD.   Abdomen:  soft, non-tender, normal bowel sounds, no distention, no masses, no guarding, no rigidity, no rebound tenderness, no abdominal hernia, no inguinal hernia, no hepatomegaly, and no splenomegaly.   Msk:  No deformity or scoliosis noted of thoracic or lumbar spine.   Pulses:  R and L carotid,radial,femoral,dorsalis pedis and posterior tibial pulses are full and equal bilaterally Extremities:  No clubbing, cyanosis, edema, or deformity noted with normal full range of motion of all joints.   Neurologic:  No cranial nerve deficits noted. Station and gait are normal. Plantar reflexes are down-going bilaterally. DTRs are symmetrical throughout. Sensory, motor and coordinative functions appear intact. Skin:  turgor normal, color normal, no rashes, no suspicious lesions, no ecchymoses, no petechiae, no purpura, no ulcerations, and no edema.   Cervical Nodes:  no anterior cervical adenopathy and no posterior cervical adenopathy.   Axillary Nodes:  no R axillary adenopathy and no L axillary adenopathy.   Inguinal Nodes:  no R inguinal adenopathy and no L inguinal adenopathy.   Psych:  Oriented X3, memory intact for recent and remote, normally interactive, good eye contact, not anxious appearing, not depressed  appearing, not agitated, and not suicidal.   Additional Exam:   EKG is unchanged   Impression & Recommendations:  Problem # 1:  SLEEP APNEA (ICD-780.57) Assessment New  Orders: Venipuncture (16109) TLB-BMP (Basic Metabolic Panel-BMET) (80048-METABOL) TLB-CBC Platelet - w/Differential (85025-CBCD) TLB-Hepatic/Liver Function Pnl (80076-HEPATIC) TLB-TSH (Thyroid Stimulating Hormone) (84443-TSH) TLB-BNP (B-Natriuretic Peptide) (83880-BNPR) T-Vitamin D (25-Hydroxy) (60454-09811) Sleep Disorder Referral (Sleep Disorder) EKG w/ Interpretation (93000)  Problem # 2:  DYSPNEA/SHORTNESS OF BREATH (ICD-786.09) Assessment: New  Her updated medication list for this problem includes:    Labetalol Hcl 200 Mg Tabs (Labetalol hcl) .Marland Kitchen... Take 1 tablet by mouth two times a day  Orders: Venipuncture (91478) TLB-BMP (Basic Metabolic Panel-BMET) (80048-METABOL) TLB-CBC Platelet - w/Differential (85025-CBCD) TLB-Hepatic/Liver Function Pnl (  80076-HEPATIC) TLB-TSH (Thyroid Stimulating Hormone) (84443-TSH) TLB-BNP (B-Natriuretic Peptide) (83880-BNPR) T-Vitamin D (25-Hydroxy) (16109-60454) T-2 View CXR (71020TC) EKG w/ Interpretation (93000)  Problem # 3:  COUGH (ICD-786.2) Assessment: New  Orders: Venipuncture (09811) TLB-BMP (Basic Metabolic Panel-BMET) (80048-METABOL) TLB-CBC Platelet - w/Differential (85025-CBCD) TLB-Hepatic/Liver Function Pnl (80076-HEPATIC) TLB-TSH (Thyroid Stimulating Hormone) (84443-TSH) TLB-BNP (B-Natriuretic Peptide) (83880-BNPR) T-Vitamin D (25-Hydroxy) (91478-29562) T-2 View CXR (71020TC) EKG w/ Interpretation (93000)  Problem # 4:  HYPONATREMIA, MILD (ICD-276.1) Assessment: Unchanged  Orders: Venipuncture (13086) TLB-BMP (Basic Metabolic Panel-BMET) (80048-METABOL) TLB-CBC Platelet - w/Differential (85025-CBCD) TLB-Hepatic/Liver Function Pnl (80076-HEPATIC) TLB-TSH (Thyroid Stimulating Hormone) (84443-TSH) TLB-BNP (B-Natriuretic Peptide)  (83880-BNPR) T-Vitamin D (25-Hydroxy) (57846-96295) EKG w/ Interpretation (93000)  Problem # 5:  OTHER PRIMARY CARDIOMYOPATHIES (ICD-425.4) Assessment: Unchanged  Orders: EKG w/ Interpretation (93000)  Problem # 6:  HYPERTENSION, BENIGN ESSENTIAL (ICD-401.1) Assessment: Unchanged  Her updated medication list for this problem includes:    Labetalol Hcl 200 Mg Tabs (Labetalol hcl) .Marland Kitchen... Take 1 tablet by mouth two times a day  Orders: Venipuncture (28413) TLB-BMP (Basic Metabolic Panel-BMET) (80048-METABOL) TLB-CBC Platelet - w/Differential (85025-CBCD) TLB-Hepatic/Liver Function Pnl (80076-HEPATIC) TLB-TSH (Thyroid Stimulating Hormone) (84443-TSH) TLB-BNP (B-Natriuretic Peptide) (83880-BNPR) T-Vitamin D (25-Hydroxy) (24401-02725) EKG w/ Interpretation (93000)  BP today: 130/90 Prior BP: 112/70 (10/23/2009)  Prior 10 Yr Risk Heart Disease: 7 % (04/23/2009)  Labs Reviewed: K+: 5.4 (10/09/2009) Creat: : 0.7 (10/09/2009)   Chol: 167 (11/02/2007)   HDL: 48.6 (11/02/2007)   LDL: 100 (11/02/2007)   TG: 92 (11/02/2007)  Problem # 7:  ANEMIA (ICD-285.9) Assessment: Unchanged  Orders: Venipuncture (36644) TLB-BMP (Basic Metabolic Panel-BMET) (80048-METABOL) TLB-CBC Platelet - w/Differential (85025-CBCD) TLB-Hepatic/Liver Function Pnl (80076-HEPATIC) TLB-TSH (Thyroid Stimulating Hormone) (84443-TSH) TLB-BNP (B-Natriuretic Peptide) (83880-BNPR) T-Vitamin D (25-Hydroxy) (03474-25956) EKG w/ Interpretation (93000)  Hgb: 12.9 (10/09/2009)   Hct: 36.5 (10/09/2009)   Platelets: 206.0 (10/09/2009) RBC: 3.86 (10/09/2009)   RDW: 12.8 (10/09/2009)   WBC: 6.7 (10/09/2009) MCV: 94.7 (10/09/2009)   MCHC: 35.3 (10/09/2009) Iron: 74 (08/27/2008)   % Sat: 21.4 (08/27/2008) B12: 785 (08/27/2008)   Folate: >20.0 ng/mL (08/27/2008)   TSH: 1.45 (10/09/2009)  Complete Medication List: 1)  Labetalol Hcl 200 Mg Tabs (Labetalol hcl) .... Take 1 tablet by mouth two times a day 2)  Citalopram  Hydrobromide 20 Mg Tabs (Citalopram hydrobromide) .... Take 1 tablet by mouth once a day 3)  Simvastatin 80 Mg Tabs (Simvastatin) .... Take 1 tablet by mouth once a day 4)  Citracal Plus Tabs (Multiple minerals-vitamins) .... Once daily 5)  Omega-3 Fish Oil 1000 Mg Caps (Omega-3 fatty acids) .... Two caps a day 6)  Cinnamon 500 Mg Caps (Cinnamon) .... Two caps a day 7)  Lorazepam 0.5 Mg Tabs (Lorazepam) .... Take 1 tablet by mouth three times a day and 4qhs 8)  Hydroxyzine Hcl 10 Mg/44ml Syrp (Hydroxyzine hcl) .... 5-10 ml by mouth qid as needed for itching 9)  Nexium 40 Mg Cpdr (Esomeprazole magnesium) .Marland Kitchen.. 1 by mouth once daily 10)  Promethazine Hcl 25 Mg Tabs (Promethazine hcl) .Marland Kitchen.. 1 by mouth every 4 hours as needed for nausea 11)  Divalproex Sodium 500 Mg Xr24h-tab (Divalproex sodium) .... 3 qhs  Other Orders: T- * Misc. Laboratory test [phone])  Patient Instructions: 1)  Please schedule a follow-up appointment in 2 months. 2)  Limit your Sodium (Salt) to less than 4 grams a day (slightly less than 1 teaspoon) to prevent fluid retention, swelling, or worsening or symptoms. 3)  It is important that you exercise regularly at least 20 minutes  5 times a week. If you develop chest pain, have severe difficulty breathing, or feel very tired , stop exercising immediately and seek medical attention. 4)  You need to lose weight. Consider a lower calorie diet and regular exercise.  5)  Check your Blood Pressure regularly. If it is above 130/90: you should make an appointment.   Orders Added: 1)  Venipuncture [36415] 2)  TLB-BMP (Basic Metabolic Panel-BMET) [80048-METABOL] 3)  TLB-CBC Platelet - w/Differential [85025-CBCD] 4)  TLB-Hepatic/Liver Function Pnl [80076-HEPATIC] 5)  TLB-TSH (Thyroid Stimulating Hormone) [84443-TSH] 6)  TLB-BNP (B-Natriuretic Peptide) [83880-BNPR] 7)  T-Vitamin D (25-Hydroxy) [91478-29562] 8)  T- * Misc. Laboratory test [99999] 9)  Sleep Disorder Referral [Sleep  Disorder] 10)  T-2 View CXR [71020TC] 11)  Est. Patient Level IV [13086] 12)  EKG w/ Interpretation [93000]   Immunization History:  Influenza Immunization History:    Influenza:  historical (09/10/2009)   Immunization History:  Influenza Immunization History:    Influenza:  Historical (09/10/2009)

## 2010-02-11 NOTE — Progress Notes (Signed)
Summary: RESULTS  Phone Note Call from Patient   Summary of Call: Patient is requesting results, I see letter in EMR, need to call pt to discuss.  Initial call taken by: Lamar Sprinkles, CMA,  January 07, 2010 9:45 AM  Follow-up for Phone Call        Pt informed of letter Follow-up by: Lamar Sprinkles, CMA,  January 07, 2010 5:22 PM

## 2010-02-11 NOTE — Assessment & Plan Note (Signed)
Summary: cough/SD   Vital Signs:  Patient profile:   60 year old female Menstrual status:  hysterectomy Height:      65 inches Weight:      212 pounds BMI:     35.41 O2 Sat:      95 % on Room air Temp:     98.6 degrees F oral Pulse rate:   78 / minute Pulse rhythm:   regular Resp:     16 per minute BP sitting:   122 / 82  (left arm) Cuff size:   large  Vitals Entered By: Rock Nephew CMA (January 13, 2010 8:36 AM)  Nutrition Counseling: Patient's BMI is greater than 25 and therefore counseled on weight management options.  O2 Flow:  Room air CC: Patient c/o continued cough, congestion, wheezing x 01/08/10, Cough Is Patient Diabetic? No Pain Assessment Patient in pain? no       Does patient need assistance? Functional Status Self care Ambulation Normal   Primary Care Provider:  Etta Grandchild MD  CC:  Patient c/o continued cough, congestion, wheezing x 01/08/10, and Cough.  History of Present Illness:  Cough      This is a 60 year old woman who presents with Cough.  The symptoms began 4-6 months ago.  The intensity is described as moderate.  The patient reports productive cough, but denies non-productive cough, pleuritic chest pain, exertional dyspnea, fever, hemoptysis, and malaise.  The patient denies the following symptoms: cold/URI symptoms, sore throat, nasal congestion, chronic rhinitis, weight loss, acid reflux symptoms, and peripheral edema.  The cough is worse with cold exposure.  Diagnostic testing to date has included CXR.    Preventive Screening-Counseling & Management  Alcohol-Tobacco     Alcohol drinks/day: 0     Alcohol Counseling: not indicated; patient does not drink     Smoking Status: never     Passive Smoke Exposure: no     Tobacco Counseling: not indicated; no tobacco use  Clinical Review Panels:  Prevention   Last Mammogram:  ASSESSMENT: Negative - BI-RADS 1^MM DIGITAL SCREENING W/ IMPLANTS (11/19/2007)   Last Pap Smear:  done  (09/11/2007)   Last Colonoscopy:  done (01/11/2003)  Immunizations   Last Tetanus Booster:  done (01/11/2004)   Last Flu Vaccine:  Historical (09/10/2009)  Lipid Management   Cholesterol:  167 (11/02/2007)   LDL (bad choesterol):  100 (11/02/2007)   HDL (good cholesterol):  48.6 (11/02/2007)  Diabetes Management   Creatinine:  0.7 (12/31/2009)   Last Flu Vaccine:  Historical (09/10/2009)  CBC   WBC:  6.2 (12/31/2009)   RBC:  3.69 (12/31/2009)   Hgb:  12.7 (12/31/2009)   Hct:  35.8 (12/31/2009)   Platelets:  160.0 (12/31/2009)   MCV  97.0 (12/31/2009)   MCHC  35.5 (12/31/2009)   RDW  12.9 (12/31/2009)   PMN:  59.1 (12/31/2009)   Lymphs:  15.0 (12/31/2009)   Monos:  18.1 (12/31/2009)   Eosinophils:  7.0 (12/31/2009)   Basophil:  0.8 (12/31/2009)  Complete Metabolic Panel   Glucose:  84 (12/31/2009)   Sodium:  134 (12/31/2009)   Potassium:  4.6 (12/31/2009)   Chloride:  100 (12/31/2009)   CO2:  27 (12/31/2009)   BUN:  16 (12/31/2009)   Creatinine:  0.7 (12/31/2009)   Albumin:  3.5 (12/31/2009)   Total Protein:  5.9 (12/31/2009)   Calcium:  9.1 (12/31/2009)   Total Bili:  0.5 (12/31/2009)   Alk Phos:  44 (12/31/2009)   SGPT (ALT):  20 (12/31/2009)   SGOT (AST):  27 (12/31/2009)   Medications Prior to Update: 1)  Labetalol Hcl 200 Mg Tabs (Labetalol Hcl) .... Take 1 Tablet By Mouth Two Times A Day 2)  Citalopram Hydrobromide 20 Mg Tabs (Citalopram Hydrobromide) .... Take 1 Tablet By Mouth Once A Day 3)  Simvastatin 80 Mg Tabs (Simvastatin) .... Take 1 Tablet By Mouth Once A Day 4)  Citracal Plus  Tabs (Multiple Minerals-Vitamins) .... Once Daily 5)  Omega-3 Fish Oil 1000 Mg Caps (Omega-3 Fatty Acids) .... Two Caps A Day 6)  Cinnamon 500 Mg Caps (Cinnamon) .... Two Caps A Day 7)  Lorazepam 0.5 Mg Tabs (Lorazepam) .... Take 1 Tablet By Mouth Three Times A Day and 4qhs 8)  Hydroxyzine Hcl 10 Mg/34ml Syrp (Hydroxyzine Hcl) .... 5-10 Ml By Mouth Qid As Needed For  Itching 9)  Promethazine Hcl 25 Mg Tabs (Promethazine Hcl) .Marland Kitchen.. 1 By Mouth Every 4 Hours As Needed For Nausea 10)  Divalproex Sodium 500 Mg Xr24h-Tab (Divalproex Sodium) .... 3 Qhs 11)  Guiatuss Ac 100-10 Mg/72ml Syrp (Guaifenesin-Codeine) .... 5-10 Ml By Mouth Qid As Needed For Cough  Current Medications (verified): 1)  Labetalol Hcl 200 Mg Tabs (Labetalol Hcl) .... Take 1 Tablet By Mouth Two Times A Day 2)  Citalopram Hydrobromide 20 Mg Tabs (Citalopram Hydrobromide) .... Take 1 Tablet By Mouth Once A Day 3)  Simvastatin 80 Mg Tabs (Simvastatin) .... Take 1 Tablet By Mouth Once A Day 4)  Citracal Plus  Tabs (Multiple Minerals-Vitamins) .... Once Daily 5)  Omega-3 Fish Oil 1000 Mg Caps (Omega-3 Fatty Acids) .... Two Caps A Day 6)  Cinnamon 500 Mg Caps (Cinnamon) .... Two Caps A Day 7)  Lorazepam 0.5 Mg Tabs (Lorazepam) .... Take 1 Tablet By Mouth Three Times A Day and 4qhs 8)  Hydroxyzine Hcl 10 Mg/51ml Syrp (Hydroxyzine Hcl) .... 5-10 Ml By Mouth Qid As Needed For Itching 9)  Promethazine Hcl 25 Mg Tabs (Promethazine Hcl) .Marland Kitchen.. 1 By Mouth Every 4 Hours As Needed For Nausea 10)  Divalproex Sodium 500 Mg Xr24h-Tab (Divalproex Sodium) .... 3 Qhs 11)  Guiatuss Ac 100-10 Mg/68ml Syrp (Guaifenesin-Codeine) .... 5-10 Ml By Mouth Qid As Needed For Cough 12)  Ceftin 500 Mg Tab (Cefuroxime Axetil) .... Take One (1) Tablet By Mouth Two (2) Times A Day X 10 Days 13)  Advair Diskus 250-50 Mcg/dose Aepb (Fluticasone-Salmeterol) .... One Puff Two Times A Day For Asthma  Allergies (verified): 1)  ! Morphine  Past History:  Past Medical History: Last updated: 01/06/2010 HYPERTENSION, BENIGN ESSENTIAL (ICD-401.1) HYPERLIPIDEMIA (ICD-272.4) DIVERTICULOSIS, COLON (ICD-562.10) OVERACTIVE BLADDER (ICD-596.51) ANEMIA (ICD-285.9) OSTEOPENIA (ICD-733.90) HEADACHE (ICD-784.0) BIPOLAR DISORDER UNSPECIFIED (ICD-296.80) Coddle (Psych) GI - [facility] (not going back) neuro - [facility]witt (not going back)  Past  Surgical History: Last updated: 01/06/2010 Hysterectomy Tubal ligation  Sinus surgery (TMJ) Breast implants 1980  Family History: Last updated: 12/18/2008 Family History of CAD Female 1st degree relative <50 Family History Diabetes 1st degree relative Family History Kidney disease Family History of Cardiovascular disorder  Social History: Last updated: 12/18/2008 Divorced Never Smoked Alcohol use-no Drug use-no Regular exercise-yes  Risk Factors: Alcohol Use: 0 (01/13/2010) Exercise: yes (10/29/2007)  Risk Factors: Smoking Status: never (01/13/2010) Passive Smoke Exposure: no (01/13/2010)  Family History: Reviewed history from 12/18/2008 and no changes required. Family History of CAD Female 1st degree relative <50 Family History Diabetes 1st degree relative Family History Kidney disease Family History of Cardiovascular disorder  Social History: Reviewed history from 12/18/2008  and no changes required. Divorced Never Smoked Alcohol use-no Drug use-no Regular exercise-yes  Review of Systems       The patient complains of weight gain and prolonged cough.  The patient denies anorexia, fever, weight loss, hoarseness, chest pain, syncope, peripheral edema, headaches, hemoptysis, abdominal pain, hematuria, suspicious skin lesions, difficulty walking, depression, abnormal bleeding, enlarged lymph nodes, and angioedema.    Physical Exam  General:  alert, well-developed, well-nourished, well-hydrated, appropriate dress, normal appearance, healthy-appearing, cooperative to examination, good hygiene, and overweight-appearing.  She has paroxysms of coughing. Head:  normocephalic, atraumatic, no abnormalities observed, and no abnormalities palpated.   Eyes:  vision grossly intact, pupils equal, pupils round, pupils reactive to light, and no injection.   Ears:  R ear normal and L ear normal.   Nose:  External nasal examination shows no deformity or inflammation. Nasal mucosa are  pink and moist without lesions or exudates. Mouth:  Oral mucosa and oropharynx without lesions or exudates.  Teeth in good repair. Neck:  supple, full ROM, no masses, no thyromegaly, no thyroid nodules or tenderness, no JVD, normal carotid upstroke, no carotid bruits, no cervical lymphadenopathy, and no neck tenderness.   Lungs:  She has expiratory rhonchi with coughing but otherwise her lungs are CTA bilaterally anterior and posteriorly. Heart:  normal rate, regular rhythm, no murmur, no gallop, no rub, and no JVD.   Abdomen:  soft, non-tender, normal bowel sounds, no distention, no masses, no guarding, no rigidity, no rebound tenderness, no abdominal hernia, no inguinal hernia, no hepatomegaly, and no splenomegaly.   Msk:  No deformity or scoliosis noted of thoracic or lumbar spine.   Pulses:  R and L carotid,radial,femoral,dorsalis pedis and posterior tibial pulses are full and equal bilaterally Extremities:  No clubbing, cyanosis, edema, or deformity noted with normal full range of motion of all joints.   Neurologic:  No cranial nerve deficits noted. Station and gait are normal. Plantar reflexes are down-going bilaterally. DTRs are symmetrical throughout. Sensory, motor and coordinative functions appear intact.   Impression & Recommendations:  Problem # 1:  ASTHMA NOS W/ACUTE EXACERBATION (ZOX-096.04) Assessment New  Her updated medication list for this problem includes:    Advair Diskus 250-50 Mcg/dose Aepb (Fluticasone-salmeterol) ..... One puff two times a day for asthma  Pulmonary Functions Reviewed: O2 sat: 95 (01/13/2010)  Problem # 2:  BRONCHITIS-ACUTE (ICD-466.0) Assessment: New  The following medications were removed from the medication list:    Guiatuss Ac 100-10 Mg/22ml Syrp (Guaifenesin-codeine) .Marland Kitchen... 5-10 ml by mouth qid as needed for cough Her updated medication list for this problem includes:    Guiatuss Ac 100-10 Mg/82ml Syrp (Guaifenesin-codeine) .Marland Kitchen... 5-10 ml by mouth  qid as needed for cough    Ceftin 500 Mg Tab (Cefuroxime axetil) .Marland Kitchen... Take one (1) tablet by mouth two (2) times a day x 10 days    Advair Diskus 250-50 Mcg/dose Aepb (Fluticasone-salmeterol) ..... One puff two times a day for asthma  Take antibiotics and other medications as directed. Encouraged to push clear liquids, get enough rest, and take acetaminophen as needed. To be seen in 5-7 days if no improvement, sooner if worse.  Problem # 3:  HYPERTENSION, BENIGN ESSENTIAL (ICD-401.1) Assessment: Improved  I may need to stop this beta-blocker if these symptoms persist Her updated medication list for this problem includes:    Labetalol Hcl 200 Mg Tabs (Labetalol hcl) .Marland Kitchen... Take 1 tablet by mouth two times a day  BP today: 122/82 Prior BP: 114/84 (01/06/2010)  Prior 10 Yr Risk Heart Disease: 7 % (04/23/2009)  Labs Reviewed: K+: 4.6 (12/31/2009) Creat: : 0.7 (12/31/2009)   Chol: 167 (11/02/2007)   HDL: 48.6 (11/02/2007)   LDL: 100 (11/02/2007)   TG: 92 (11/02/2007)  Complete Medication List: 1)  Labetalol Hcl 200 Mg Tabs (Labetalol hcl) .... Take 1 tablet by mouth two times a day 2)  Citalopram Hydrobromide 20 Mg Tabs (Citalopram hydrobromide) .... Take 1 tablet by mouth once a day 3)  Simvastatin 80 Mg Tabs (Simvastatin) .... Take 1 tablet by mouth once a day 4)  Citracal Plus Tabs (Multiple minerals-vitamins) .... Once daily 5)  Omega-3 Fish Oil 1000 Mg Caps (Omega-3 fatty acids) .... Two caps a day 6)  Cinnamon 500 Mg Caps (Cinnamon) .... Two caps a day 7)  Lorazepam 0.5 Mg Tabs (Lorazepam) .... Take 1 tablet by mouth three times a day and 4qhs 8)  Hydroxyzine Hcl 10 Mg/66ml Syrp (Hydroxyzine hcl) .... 5-10 ml by mouth qid as needed for itching 9)  Promethazine Hcl 25 Mg Tabs (Promethazine hcl) .Marland Kitchen.. 1 by mouth every 4 hours as needed for nausea 10)  Divalproex Sodium 500 Mg Xr24h-tab (Divalproex sodium) .... 3 qhs 11)  Guiatuss Ac 100-10 Mg/21ml Syrp (Guaifenesin-codeine) .... 5-10 ml  by mouth qid as needed for cough 12)  Ceftin 500 Mg Tab (Cefuroxime axetil) .... Take one (1) tablet by mouth two (2) times a day x 10 days 13)  Advair Diskus 250-50 Mcg/dose Aepb (Fluticasone-salmeterol) .... One puff two times a day for asthma  Patient Instructions: 1)  Please schedule a follow-up appointment in 1 month. 2)  It is important that you exercise regularly at least 20 minutes 5 times a week. If you develop chest pain, have severe difficulty breathing, or feel very tired , stop exercising immediately and seek medical attention. 3)  You need to lose weight. Consider a lower calorie diet and regular exercise.  4)  Check your Blood Pressure regularly. If it is above 130/80: you should make an appointment. 5)  Take your antibiotic as prescribed until ALL of it is gone, but stop if you develop a rash or swelling and contact our office as soon as possible. 6)  Acute bronchitis symptoms for less than 10 days are not helped by antibiotics. take over the counter cough medications. call if no improvment in  5-7 days, sooner if increasing cough, fever, or new symptoms( shortness of breath, chest pain). Prescriptions: ADVAIR DISKUS 250-50 MCG/DOSE AEPB (FLUTICASONE-SALMETEROL) One puff two times a day for asthma  #4 inhs x 0   Entered and Authorized by:   Etta Grandchild MD   Signed by:   Etta Grandchild MD on 01/13/2010   Method used:   Samples Given   RxID:   [id number] CEFTIN 500 MG TAB (CEFUROXIME AXETIL) Take one (1) tablet by mouth two (2) times a day X 10 days  #28 x 0   Entered and Authorized by:   Etta Grandchild MD   Signed by:   Etta Grandchild MD on 01/13/2010   Method used:   Print then Give to Patient   RxID:   [id number] GUIATUSS AC 100-10 MG/5ML SYRP (GUAIFENESIN-CODEINE) 5-10 ml by mouth QID as needed for cough  #8 ounces x 1   Entered and Authorized by:   Etta Grandchild MD   Signed by:   Etta Grandchild MD on 01/13/2010   Method used:   Print then Give to  Patient  RxID:   [id number]    Orders Added: 1)  Est. Patient Level IV [14782]

## 2010-02-11 NOTE — Assessment & Plan Note (Signed)
Summary: sleep consult//sh   Visit Type:  Initial Consult Copy to:  pcp Primary Provider/Referring Provider:  Etta Grandchild MD  CC:  Sleep consult.  History of Present Illness: 60/F , bipolar disorder for evlaution of obstructive sleep apnea & restless legs ' I feel like a valve is opening and closing. Also sometimes it feel like it flutters. Once that I know of I woke up I wasn't breathing. I don''t go to sleep very easy. My legs sometimes are restless.' most times at bedtime trouble going to sleep - latency 1-2 h, bedtime upto 10p, nocturia , 4-5 awakenings , sleeps on her side x 2 pillows, granddaughter has witnessed apneas, loud snoring mentioned by ex husband, oob at 0600 - tired, occ headaches in am, dry mouth Just started on valproate at bedtime for mood fluctuations Epworth Sleepiness Score 2  - understating this, does not nap daytime. Has gained 15 lbs last 2 y - antidepressants Reported DOE & dry cough to dr Yetta Barre- CXR nml  Preventive Screening-Counseling & Management  Alcohol-Tobacco     Alcohol drinks/day: 0     Smoking Status: never   History of Present Illness: I feel like a valve is opening and closing. Also sometimes it feel like it flutters. Once that I know of I woke up I wasn't breathing. I don''t go to sleep very easy. My legs sometimes are restless.  What time do you typically go to bed?(between what hours): 7-10pm  How long does it take you to fall asleep? 1-2 hours  How many times during the night do you wake up? 4 or 5  What time do you get out of bed to start your day? 5 to 6am  Do you drive or operate heavy machinery in your occupation? no  How much has your weight changed (up or down) over the past two years? (in pounds): 12 lbs +  Have you ever had a sleep study before?  If yes,when and where: no  Do you currently use CPAP ? If so , at what pressure? no  Do you wear oxygen at any time? If yes, how many liters per minute? no Current  Medications (verified): 1)  Labetalol Hcl 200 Mg Tabs (Labetalol Hcl) .... Take 1 Tablet By Mouth Two Times A Day 2)  Citalopram Hydrobromide 20 Mg Tabs (Citalopram Hydrobromide) .... Take 1 Tablet By Mouth Once A Day 3)  Simvastatin 80 Mg Tabs (Simvastatin) .... Take 1 Tablet By Mouth Once A Day 4)  Citracal Plus  Tabs (Multiple Minerals-Vitamins) .... Once Daily 5)  Omega-3 Fish Oil 1000 Mg Caps (Omega-3 Fatty Acids) .... Two Caps A Day 6)  Cinnamon 500 Mg Caps (Cinnamon) .... Two Caps A Day 7)  Lorazepam 0.5 Mg Tabs (Lorazepam) .... Take 1 Tablet By Mouth Three Times A Day and 4qhs 8)  Hydroxyzine Hcl 10 Mg/55ml Syrp (Hydroxyzine Hcl) .... 5-10 Ml By Mouth Qid As Needed For Itching 9)  Promethazine Hcl 25 Mg Tabs (Promethazine Hcl) .Marland Kitchen.. 1 By Mouth Every 4 Hours As Needed For Nausea 10)  Divalproex Sodium 500 Mg Xr24h-Tab (Divalproex Sodium) .... 3 Qhs  Allergies (verified): 1)  ! Morphine  Past History:  Past Medical History: HYPERTENSION, BENIGN ESSENTIAL (ICD-401.1) HYPERLIPIDEMIA (ICD-272.4) DIVERTICULOSIS, COLON (ICD-562.10) OVERACTIVE BLADDER (ICD-596.51) ANEMIA (ICD-285.9) OSTEOPENIA (ICD-733.90) HEADACHE (ICD-784.0) BIPOLAR DISORDER UNSPECIFIED (ICD-296.80) Coddle (Psych) GI - [facility] (not going back) neuro - [facility]witt (not going back)  Past Surgical History: Hysterectomy Tubal ligation  Sinus surgery (TMJ) Breast implants 1980  Family History: Reviewed history from 12/18/2008 and no changes required. Family History of CAD Female 1st degree relative <50 Family History Diabetes 1st degree relative Family History Kidney disease Family History of Cardiovascular disorder  Social History: Reviewed history from 12/18/2008 and no changes required. Divorced Never Smoked Alcohol use-no Drug use-no Regular exercise-yes  Review of Systems       The patient complains of shortness of breath with activity, shortness of breath at rest, non-productive cough, indigestion,  weight change, headaches, anxiety, depression, hand/feet swelling, and joint stiffness or pain.  The patient denies productive cough, coughing up blood, chest pain, irregular heartbeats, acid heartburn, loss of appetite, abdominal pain, difficulty swallowing, sore throat, tooth/dental problems, nasal congestion/difficulty breathing through nose, sneezing, itching, ear ache, rash, change in color of mucus, and fever.    Vital Signs:  Patient profile:   60 year old female Menstrual status:  hysterectomy Height:      65 inches Weight:      216 pounds BMI:     36.07 O2 Sat:      94 % on Room air Temp:     98.4 degrees F oral Pulse rate:   78 / minute BP sitting:   114 / 84  (right arm) Cuff size:   large  Vitals Entered By: Zackery Barefoot CMA (January 06, 2010 4:31 PM)  O2 Flow:  Room air CC: Sleep consult Comments Medications reviewed with patient Verified contact number and pharmacy with patient Zackery Barefoot [facility]  January 06, 2010 4:33 PM    Physical Exam  Additional Exam:  Gen. Pleasant, well-nourished, in no distress, normal affect ENT - no lesions, no post nasal drip, class 2 airway Neck: No JVD, no thyromegaly, no carotid bruits Lungs: no use of accessory muscles, no dullness to percussion, bibasal coarse rales, no rhonchi  Cardiovascular: Rhythm regular, heart sounds  normal, no murmurs or gallops, no peripheral edema Abdomen: soft and non-tender, no hepatosplenomegaly, BS normal. Musculoskeletal: No deformities, no cyanosis or clubbing Neuro:  alert, non focal     Impression & Recommendations:  Problem # 1:  SLEEP APNEA (ICD-780.57) The pathophysiology of obstructive sleep apnea, it's cardiovascular consequences and modes of treatment including CPAP were discussed with the patient in great detail.  Overnight psg scheduled as a split study Orders: Consultation Level III ([address]) Sleep Study (Sleep Study)  Problem # 2:  DYSPNEA/SHORTNESS OF BREATH  (ICD-786.09) Bibasal crackles but no evidence of ILD on cxr may need pfts & further wu in the future in this non smoker.  Patient Instructions: 1)  Copy sent to: dr Sanda Linger  2)  Please schedule a follow-up appointment in 2 weeks after sleep study

## 2010-02-11 NOTE — Progress Notes (Signed)
  Phone Note Refill Request Message from:  Patient on January 20, 2010 12:01 PM  Refills Requested: Medication #1:  GUIATUSS AC 100-10 MG/5ML SYRP 5-10 ml by mouth QID as needed for cough Patient called, no refils on rx for cough syrup. Is this ok to refill.Alvy Beal Archie CMA  January 20, 2010 12:02 PM    Follow-up for Phone Call        yes Follow-up by: Etta Grandchild MD,  January 20, 2010 12:09 PM  Additional Follow-up for Phone Call Additional follow up Details #1::        Patient notified and rx called in.Alvy Beal Archie CMA  January 20, 2010 2:21 PM     Prescriptions: GUIATUSS AC 100-10 MG/5ML SYRP (GUAIFENESIN-CODEINE) 5-10 ml by mouth QID as needed for cough  #8 ounces x 1   Entered by:   Rock Nephew CMA   Authorized by:   Etta Grandchild MD   Signed by:   Rock Nephew CMA on 01/20/2010   Method used:   Telephoned to ...       [id number] (retail)       [address]       [place], [place]  [address]       Ph: [phone] or [phone]       Fax: [phone]   RxID:   [id number]

## 2010-02-11 NOTE — Progress Notes (Signed)
Summary: OV TOMORROW  Phone Note Call from Patient Call back at [facility] Phone [phone]   Summary of Call: Patient is requesting a call back, still feels bad - recently was given zpak.  Initial call taken by: Lamar Sprinkles, CMA,  January 12, 2010 12:02 PM  Follow-up for Phone Call        Scheduled for office visit tomorrow.  Follow-up by: Lamar Sprinkles, CMA,  January 12, 2010 6:27 PM

## 2010-02-11 NOTE — Letter (Signed)
Summary: Appointment - Cardiac MRI  [facility], Main Office  [address]   [place], [place] [address]   Phone: [phone]  Fax: [phone]      January 22, 2010 MRN: [id number]   [facility] [address] [place], [place]  [address]   Dear Ms. Sugg,   We have scheduled the above patient for an appointment for a Cardiac MRI on Jan 28, 2010  at 4:00 p.m.  Please refer to the below information for the location and instructions for this test:  Location:     [facility]       [address]       [place], [place]  [address] Instructions:    Wilmon Arms at [facility] Outpatient Registration 45 minutes prior to your appointment time.  This will ensure you are in the Radiology Department 30 minutes prior to your appointment.    There are no restrictions for this test you may eat and take medications as usual.  If you need to reschedule this appointment please call at the number listed above.  Sincerely,      Lorne Skeens  [facility] Scheduling Team

## 2010-02-11 NOTE — Miscellaneous (Signed)
Summary: Orders Update  Clinical Lists Changes  Orders: Added new Referral order of Cardiac MRI (Cardiac MRI) - Signed 

## 2010-02-11 NOTE — Progress Notes (Signed)
  Phone Note Other Incoming   Caller: pt  Summary of Call: Pt calling stating that she was seen last week. She had developed a dry cough. But states in the past couple days it has turned into a deep productive cough (mucous has no color) with hot flashes when she is coughing. Please Advise Initial call taken by: Ami Bullins CMA,  January 08, 2010 8:41 AM    New/Updated Medications: AZITHROMYCIN 500 MG TABS (AZITHROMYCIN) One by mouth once daily for 3 days GUIATUSS AC 100-10 MG/5ML SYRP (GUAIFENESIN-CODEINE) 5-10 ml by mouth QID as needed for cough Prescriptions: GUIATUSS AC 100-10 MG/5ML SYRP (GUAIFENESIN-CODEINE) 5-10 ml by mouth QID as needed for cough  #8 ounces x 0   Entered by:   Ami Bullins CMA   Authorized by:   Etta Grandchild MD   Signed by:   Bill Salinas CMA on 01/08/2010   Method used:   Telephoned to ...       [id number]       [address]       [place], [place]  [address]       Ph: [phone] or [phone]       Fax: [phone]   RxID:   [id number] AZITHROMYCIN 500 MG TABS (AZITHROMYCIN) One by mouth once daily for 3 days  #3 x 0   Entered by:   Ami Bullins CMA   Authorized by:   Etta Grandchild MD   Signed by:   Bill Salinas CMA on 01/08/2010   Method used:   Telephoned to ...       [facility] ([facility]       [address]       [place], [place]  [address]       Ph: [phone] or [phone]       Fax: [phone]   RxID:   [id number] GUIATUSS AC 100-10 MG/5ML SYRP (GUAIFENESIN-CODEINE) 5-10 ml by mouth QID as needed for cough  #8 ounces x 0   Entered and Authorized by:   Etta Grandchild MD   Signed by:   Etta Grandchild MD on 01/08/2010   Method used:   Historical   RxID:   [id number] AZITHROMYCIN 500 MG TABS (AZITHROMYCIN) One by mouth once daily for 3 days  #3 x 0   Entered and Authorized by:   Etta Grandchild MD   Signed by:   Etta Grandchild MD on 01/08/2010   Method used:   Historical   RxID:    [id number]

## 2010-02-11 NOTE — Letter (Signed)
Summary: Results Follow-up Letter  [facility]  [address] [place], [place] [address]   Phone: [phone]  Fax: [phone]    01/01/2010  [address] [place], [place]  [address]  Dear Ms. Yabut,   The following are the results of your recent test(s):  Test     Result     Valproic acid   a little too high CBC       mild anemia Liver/kidney   normal Heart failure   positive Thyroid     normal Chest Xray     normal  _________________________________________________________  Please call for an appointment soon _________________________________________________________ _________________________________________________________ _________________________________________________________  Sincerely,  Sanda Linger MD [facility]

## 2010-02-12 DIAGNOSIS — F1910 Other psychoactive substance abuse, uncomplicated: Secondary | ICD-10-CM

## 2010-02-12 DIAGNOSIS — F319 Bipolar disorder, unspecified: Secondary | ICD-10-CM

## 2010-02-18 ENCOUNTER — Other Ambulatory Visit: Payer: Self-pay | Admitting: Cardiovascular Disease

## 2010-02-20 NOTE — H&P (Signed)
Donna Sexton               ACCOUNT NO.:  [id number]  MEDICAL RECORD NO.:  [id number]           PATIENT TYPE:  I  LOCATION:  0502                          FACILITY:  [facility]  PHYSICIAN:  Marlis Edelson, DO        DATE OF BIRTH:  August 02, 1950  DATE OF ADMISSION:  02/11/2010 DATE OF DISCHARGE:                      PSYCHIATRIC ADMISSION ASSESSMENT   CHIEF COMPLAINT:  Altered mental status.  HISTORY OF CHIEF COMPLAINT:  Donna Sexton is a 60 year old Caucasian female admitted to the [facility] following inpatient stay for altered mental status.  She had presented to the emergency room in a state of confusion.  She was admitted to the hospital with what was thought to be a history of prescription drug abuse.  Now in a more coherent state, she states that she has been abusing prescription medications that has included the use of lorazepam for quite some time and she states that many times she would take "more than I should have." She also reports having used pain medicines for a number of years due to history of migraines and sometimes taking those more than they were intended to.  When I asked her specifically about her reason for admission, she said for addictions and then referred to prescription drugs as her problem.  She had no suicidal or homicidal ideation.  She is complaining of short-term memory loss.  She does have a history of bipolar disorder with previous treatments that have included lithium, but that was discontinued due to significant tremors and weight gain, and valproic acid which caused stomach problems.  She reports being on one other mood stabilizing medicine, but cannot recall its name at present.  She had originally presented to the hospital on February 01, 2010.  She has previously been at the [facility] in August 2005 due to bipolar disorder.  MRI of the brain shows subcortical white matter disease with right maxillary sinus  disease.  A CT scan showed no mass, hemorrhage or infarct.  There were changes consistent with small vessel disease.  There are postoperative changes to the right anterior wall of the maxillary sinuses.  Her laboratory including a low sodium at 132, potassium 3.2 and a chloride of 94 on February 10, 2010.  Her lipids were normal on February 03, 2010.  TSH was 0.905 uIU/mL on February 02, 2010. Recent valproic acid levels were therapeutic indicating that she had been on the medication.  She had a vitamin B12 level drawn as part of an anemia panel that was within normal limits at  675 pg/mL and folate was normal at 14.8 mg/mL.  Magnesium level was equally normal.  PAST PSYCHIATRIC HISTORY:  As stated above in addition to her diagnosis bipolar disorder, she states that she is on disability for mental illness.  She related no history of hospitalizations, but has indicated she was at the hospital in August 2005.  She has had no history of suicidal attempts, but states she has taken too many pills in the past, but not intentionally.  PAST MEDICAL HISTORY: 1. Remarkable for hyperlipidemia. 2. Overactive bladder. 3. Migraines cephalgia. 4. Diverticulosis. 5. Hypertension.  6. Osteopenia.  PAST SURGICAL HISTORY: 1. TMJ surgery. 2. Sinus surgery. 3. Hysterectomy secondary to dysfunctional uterine bleeding. 4. Ganglion cyst removal of the left wrist. 5. Parotid gland cyst removal. 6. Breast implants  ALLERGIES: 1. MORPHINE. 2. She also avoids MUSCLE RELAXERS due to their sedating side effects.  MEDICATIONS PRIOR TO ADMISSION: 1. She relates taking labetalol for hypertension. 2. Ativan which was a problem drug for her because she was taking it     in excess. 3. Abilify which was stopped due to its excessive cost.  SOCIAL HISTORY:  She is divorced, but remains close to her ex-husband. She states she was married for 13 years and they simply grew apart.  Her first marriage lasted for 16  years.  She has two daughters ages 26 and 57.  She attended school through the ninth grade.  She receives social security disability income because of bipolar disorder.  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  She has no history of military background.  No legal problems.  She relates her religious preferences as God.  She has a brother who is 4 years older.  She was born and raised in the [facility] [place] area.  FAMILY HISTORY:  Unremarkable for known mental illness.  SUBSTANCE ABUSE:  She is a nonsmoker.  She has not consumed alcohol in over 2 years and has no history of illicit drug use.  MENTAL STATUS EXAM:  She was pleasant, cooperative and engaging.  She was polite.  She was dressed in a hospital gown and was wearing a robe. She was drinking fluids at the time of assessment.  Her eye contact was appropriate.  Motor behavior was normal.  Speech was clear, coherent, regular rate, rhythm, volume and tone.  Her level of consciousness was alert.  Her mood is good.  Her affect appropriate.  Anxiety level zero. Thought process linear, logical and goal directed.  Thought content, she relates some delusions that she had experienced in the hospital.  She also had some auditory hallucinations, but does appear to be improving. She has no suicidal or homicidal thought, intent or plan.  Her judgment appears to be fair.  Her insight has increased some.  She is currently oriented to person, place, time and situation.  Her concentration was fair.  ASSESSMENT:  AXIS I:  Bipolar disorder unspecified subtype, cognitive disorder not otherwise specified, may be secondary to prescription drug abuse.  Prescription drug abuse (opiates and benzodiazepines.) AXIS II:  Deferred. AXIS III:  Per past medical and surgical history above. AXIS IV:  Supportive ex-husband. AXIS V:  39.  TREATMENT PLAN:  She is admitted to the  Adult Behavioral Health Unit where she is being integrated into substance abuse groups in the milieu. We will discontinue further Ativan.  In addition, I will stop p.r.n. uses of Phenergan or other sedating medications.  We will decrease citalopram to 10 mg daily with plan to wean the medication, particularly in the setting of her bipolar disorder.  Continue labetalol for high blood pressure and hydrochlorothiazide daily for high blood pressure. She has been placed on Saphris 5 mg sublingual p.o. twice per day which should be beneficial as a mood stabilizer particularly given her history of adverse effects to the other mood stabilizers.  We will monitor mood and affect.  We will further assess her cognitive status as her sensorium clears from the excessive drug use.  ______________________________ Marlis Edelson, DO     DB/MEDQ  D:  02/12/2010  T:  02/12/2010  Job:  [id number]  Electronically Signed by Marlis Edelson MD on 02/14/2010 03:31:08 PM

## 2010-02-21 NOTE — Discharge Summary (Signed)
Donna Sexton, Donna Sexton               ACCOUNT NO.:  [id number]  MEDICAL RECORD NO.:  [id number]           PATIENT TYPE:  I  LOCATION:  0502                          FACILITY:  [facility]  PHYSICIAN:  Marlis Edelson, DO        DATE OF BIRTH:  [birth year]  DATE OF ADMISSION:  02/11/2010 DATE OF DISCHARGE:  02/16/2010                              DISCHARGE SUMMARY   CHIEF COMPLAINT:  Prescription drug abuse.  HISTORY OF A CHIEF COMPLAINT:  Donna Sexton is a pleasant 60 year old Caucasian female who was admitted to the [facility] on February 12, 2010, due to a history of prescription drug abuse.  She had no suicidality and no homicidal thoughts at the time of admission.  She stated her drug abuse began at the age of 78 due to headaches.  She has been suffering short-term memory loss and was recently in the hospital because of confusion that she felt was secondary to her medications. She has had a history of bipolar disorder, hyperlipidemia, overactive bladder, migraines, diverticulosis, hypertension, and osteopenia.  She had originally presented to the hospital on February 01, 2010, with an altered mental status.  She had previously been at the [facility] in August of 2005 because of a diagnosis of bipolar disorder.  MRI of the brain during her hospitalization showed subcortical white matter disease.  She had right maxillary sinus disease also present.  A CT scan showed no mass effect, hemorrhage, or infarct.  She had postoperative changes to the anterior wall of the right maxillary sinuses.  Laboratory on February 10, 2010, showed a sodium of 132, potassium of 3.2, and chloride of 94.  Her lipids were normal on February 03, 2010.  TSH was normal on February 02, 2010.  Her recent valproic acid level showed it to be within therapeutic limits.  Folate was normal at 14.8.  Magnesium was normal and vitamin B12 was normal at 675 pg/ml.  PAST PSYCHIATRIC HISTORY:  Bipolar  disorder.  She is on disability due to mental illness.  She related no previous hospitalizations with the exception of one noted above no history of suicidal attempts, but she states she has taken many pills in the past inappropriately but not as a suicidal gesture.  PAST MEDICAL HISTORY:  Past medical history is outlined above, but also includes TMJ surgery, hysterectomy secondary to dysfunctional uterine bleeding, ganglion removal from the left wrist, a parotid gland cyst removal, and breast implants.  ALLERGIES:  MORPHINE.  She also avoids muscle relaxers because she does not like the way they make her fee.  MEDICATIONS:  Ativan, labetalol, and Abilify.  She did stop Abilify due to its cost.  SOCIAL HISTORY:  She is divorced but remains close to her ex-husband. She was married for 13 years.  Her first marriage lasted for 16 years. She has two daughters, ages 54 and 51.  She has a Research officer, trade union. She is currently on social security disability income for bipolar disorder.  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  She has no history of legal entanglement.  No history of Financial planner.  She states her religious beliefs as, "I believe in God."  She has one brother who is 4 years older.  She was born and raised in [place].  FAMILY HISTORY:  No known family history of mental illness.  SUBSTANCE ABUSE:  She is a nonsmoker, does not use illicit drugs, and has not consumed alcohol in over 2 years.  HOSPITAL COURSE:  Ms. Butsch was admitted to the adult unit where the Ativan was discontinued.  In addition, citalopram was decreased to 10 mg daily because of concerns of activation given her history of bipolar disorder.  She was placed on Saphris prior to her hospitalization; that served as a good mood stabilizer throughout the course of her hospitalization with her clearing cognitively and her relating a better mood with no evidence of mania,  hypomania, or psychosis.  She was pleasant and cooperative on the unit and engaged well with peers.  On February 15, 2009, she stated, "I was feeling good," and mood was much better.  She felt that her memory was improving and she could give better attention to detail.  She felt that her confusion was coming from her prescription drug use.  She had no depressive symptoms and no anxiety symptoms and was fully oriented including orientation to current events, the president, former president, etc.  She was tolerating her medications of Saphris 5 mg twice per day, citalopram 10 mg daily, hydrochlorothiazide, Normodyne, and Zocor without difficulty.  Mental status examination was unremarkable.  She remained stable through the date of discharge on February 16, 2010, when she was discharged without any evidence of suicidal or homicidal ideation.  No hypomania, mania, or psychosis.  She was pleasant, cooperative, and engaging and stated that she felt much better.  She stated that she was doing well on "all accounts."  In addition, it was arranged for her to return home with her daughter.  Her daughter has actually encouraged her to live with her, and this arrangement should be very beneficial for Ms. Muhlbauer given that she will have some level of supervision to prevent future medication errors or excessive doses.  She was, therefore, discharged in stable and improved condition on the morning of February 16, 2010.  DISCHARGE ASSESSMENT:  Axis I: Altered mental status (resolved). Bipolar disorder, not otherwise specified.  Prescription drug abuse. Axis II: Deferred. Axis III:  Hyperlipidemia, overactive bladder, migraines, diverticulosis, hypertension, osteopenia, TMJ surgery, hysterectomy secondary to dysfunctional uterine bleeding, ganglion removal from the left wrist, a parotid gland cyst removal, and breast implants. Axis IV: Supportive ex-husband, supportive family. Axis V: Global Assessment  Functioning 59.  CONDITION AT DISCHARGE:  Improved with resolution of altered mental status.  No suicidal or homicidal thoughts or plan.  No thought, intent, or plan of harming self or others.  No hypomania, mania or psychosis.  DISCHARGE INSTRUCTIONS:  She is to follow up with Dr. Dixie Dials, her primary care physician, on February 21, 2010, at 9:15 a.m.  She is to follow up with her attending psychiatrist, Dr. Jennelle Human,  on March 02, 2008, at 10:15 a.m.  OTHER INSTRUCTIONS:  She is to avoid medications of abuse including illicit and prescription.  She is return to the hospital for any marked change in mood or affect.  She is to seek emergent care for any adverse reactions to medications.  DISCHARGE MEDICATIONS: 1. Saphris 5 mg b.i.d. 2. Zocor 10 mg nightly. 3. Normodyne 200 mg twice per day. 4. Hydrochlorothiazide 25 mg p.o.  daily. 5. Citalopram 10 mg p.o. daily.  PROGNOSIS:  Fair with appropriate psychiatric follow-up and compliance.          ______________________________ Marlis Edelson, DO     DB/MEDQ  D:  02/16/2010  T:  02/16/2010  Job:  [id number]  Electronically Signed by Marlis Edelson MD on 02/21/2010 11:32:33 AM

## 2010-02-25 NOTE — Progress Notes (Signed)
Summary: appt  Phone Note Call from Patient   Caller: [phone] Donna Sexton - Daughter Summary of Call: Daughter is req a call back. (Daughter is not on HIPPA form) Initial call taken by: Lamar Sprinkles, CMA,  January 29, 2010 11:19 AM  Follow-up for Phone Call        Returned call to daughter who stated that they are very concerned with patiet. Asks questions about strange things to family. Memory issues etc. Pt was seen by ER recently.   Will need to talk w/person on HIPPA form to schedule pt an apt for f/u w/Dr Yetta Barre Follow-up by: Lamar Sprinkles, CMA,  January 29, 2010 3:16 PM  Additional Follow-up for Phone Call Additional follow up Details #1::        Left message on pt's vm [phone] Additional Follow-up by: Verdell Face,  February 02, 2010 1:46 PM    Additional Follow-up for Phone Call Additional follow up Details #2::    PT HAS NEVER RETURNED THE MSG TO CALL us Follow-up by: Hilarie Fredrickson,  February 18, 2010 4:01 PM

## 2010-03-01 ENCOUNTER — Telehealth: Payer: Self-pay | Admitting: Internal Medicine

## 2010-03-02 ENCOUNTER — Inpatient Hospital Stay (HOSPITAL_COMMUNITY): Admission: RE | Admit: 2010-03-02 | Payer: 59 | Source: Ambulatory Visit

## 2010-03-03 NOTE — Letter (Signed)
Summary: [facility]   Imported By: Sherian Rein 02/25/2010 13:44:02  _____________________________________________________________________  External Attachment:    Type:   Image     Comment:   External Document

## 2010-03-09 NOTE — Progress Notes (Signed)
Summary: promethazine  Phone Note Refill Request Message from:  Fax from Pharmacy on March 01, 2010 1:31 PM  Refills Requested: Medication #1:  PROMETHAZINE HCL 25 MG TABS 1 by mouth every 4 hours as needed for nausea   Last Ref[facility]: [facility] [phone]    Method Requested: Electronic Initial call taken by: Orlan Leavens RMA,  March 01, 2010 1:31 PM    Prescriptions: PROMETHAZINE HCL 25 MG TABS (PROMETHAZINE HCL) 1 by mouth every 4 hours as needed for nausea  #30 Tablet x 0   Entered by:   Orlan Leavens RMA   Authorized by:   Etta Grandchild MD   Signed by:   Orlan Leavens RMA on 03/01/2010   Method used:   Electronically to        [facility]       [address]       [place], [place]  [address]       Ph: [phone] or [phone]       Fax: [phone]   RxID:   [id number]

## 2010-03-18 NOTE — Discharge Summary (Addendum)
Donna Sexton, Donna Sexton               ACCOUNT NO.:  [id number]  MEDICAL RECORD NO.:  [id number]           PATIENT TYPE:  LOCATION:                                 FACILITY:  PHYSICIAN:  Kela Millin, M.D.DATE OF BIRTH:  01/04/1951  DATE OF ADMISSION: DATE OF DISCHARGE:                        DISCHARGE SUMMARY - REFERRING   DISCHARGE DIAGNOSES: 1. Altered mental status - workup unrevealing, medications are likely     factor and underlying psychiatric problems/bipolar disorder. 2. Supratherapeutic valproic acid level - resolved, valproic     acid/Depakote discontinued as per psychiatrist's recommendation. 3. Bipolar disorder. 4. Hypertension. 5. Mild hyperkalemia - resolved. 6. Thrombocytopenia - resolved. 7. History of anxiety. 8. History of hyperlipidemia. 9. History of overactive bladder. 10.History of migraine headaches. 11.History of diverticulosis.  PROCEDURES AND STUDIES: 1. Chest x-ray on February 01, 2010, - left base atelectasis. 2. CT scan of head on February 01, 2010, - no acute intracranial     abnormalities.  Mild small vessel ischemic change and atrophy.     Right maxillary sinus inflammation. 3. MRI of brain without contrast on February 02, 2010, - no acute     intracranial abnormality or significant interval change.  Stable     subcortical white matter disease.  Right maxillary sinus disease. 4. EEG done on February 03, 2010, - abnormal EEG in the weak and drowsy     state demonstrating severe diffuse slowing consistent with a severe     encephalopathy.  Intermittent triphasic waves are noted.  These     findings are nonspecific and can be seen with numerous toxic     metabolic or infectious etiologies.  No definite seizure activity     seen.  CONSULTATIONS:  Psychiatry - Eulogio Ditch, MD/Steven Dorris Singh, MD  BRIEF HISTORY:  The patient is a 60 year old white female with the above- listed medical problems who presented with confusion.  It was  reported that the patient went to a bank and did not appear to know what she was doing.  EMS was called and brought her to the ER.  In the ER, the patient was not oriented per admitting MD.  A CT scan was done which was negative.  She was admitted for further evaluation and management.  It was reported that the patient did have some vomiting in the bank when she was found.  She denied any headaches, also no loss of consciousness, and no focal weakness.  HOSPITAL COURSE: 1. Altered mental status - upon admission workup included a CT scan     which came back negative and an MRI also was done and results as     stated above and negative for acute findings.  An EEG was done as     well which showed no acute findings.  The patient had a urinalysis     done which was negative for infection, and her chest x-ray did not     show any acute infiltrates.  Her valproic acid level was done on     admission and was noted to be elevated at 111.8.  Her valproic acid  was held on admission and Psychiatry was consulted for further     recommendations and Dr. Rogers Blocker saw the patient initially and     recommended that the patient should not be given any Depakote at     this time.  Followup valproic acid levels were done and the last on     February 03, 2010, was within normal limits at 76.3.  The patient's     mental status improved and she became oriented but still had some     confusion and was noted per nursing staff and her ex-husband that     she was having hallucinations as well.  The patient's ex-husband,     who is also her power of attorney, stated that the patient had been     abusing prescription medications and that one he was aware of was a     narcotic cough medication that she has been prescribed and had been     taking a lot of it.  The patient had an ammonia level and this came     back within normal limits at 32.  RPR was nonreactive.  A urine     drug screen was done and was positive  for benzodiazepines which was     one of her listed prescribed medications.  The patient also had a     vitamin B12 level done, and this came back within normal limits.     Dr. Electa Sniff with Psychiatry followed up with the patient and     following his evaluation recommended that the patient be     transferred to [facility] for inpatient psychiatric treatment which would     include appropriate medications for her bipolar disorder since the     valproic/Depakote has been put on hold/discontinued as per Psych     recommendations.  The patient is currently awaiting a bed at the     [facility]. 2. Bipolar disorder - the patient was maintained on her citalopram     during this hospital stay.  The valproic acid has been put on     hold/discontinued as per Psych recommendations at this time.  She     has a waiting bed at [facility] for further inpatient treatment.  Dr.     Electa Sniff in his followup indicated that he would not start any psych     meds at this time. 3. Uncontrolled hypertension - her labetalol was resumed during this     hospital stay and her blood pressures was still not adequately     controlled with it and so hydrochlorothiazide was added.  With     this, blood pressure control is much improved. 4. Anxiety disorder - as above, she was maintained on her citalopram     during this hospital stay, and was also on Ativan but it was p.r.n. 5. Hyperlipidemia - the patient is to continue Zocor upon discharge. 6. Thorombocytopenia - the patient's platelet count on admission was     low at 125 and subsequently gradually decreased to a low of 86     while in the hospital.  With her valproic acid discontinued, the     platelet count gradually increased again back to 116 on February 08, 2010.  The etiology of the thrombocytopenia was thought to be the     valproic/Depakote. 7. Mild hyperkalemia - resolved, her last potassium at the time of     this dictation was 4.2.  DISCHARGE  MEDICATIONS: 1. Tylenol 650  p.o. q.4 h. p.r.n. 2. Dulcolax 10 mg daily p.r.n. 3. Hydrochlorothiazide 25 mg p.o. daily. 4. Lorazepam 0.5 mg p.o. daily p.r.n. 5. Citalopram 20 mg p.o. daily. 6. Labetalol 200 mg p.o. b.i.d. 7. Phenergan 25 mg one daily p.r.n. 8. Zocor 80 mg p.o. nightly.  DISCONTINUED MEDICATIONS: 1. Valproic acid. 2. Depakote ER.  DISCHARGE CONDITION:  Improved/stable.  The patient is awaiting transfer to [facility] at this time, and outpatient followup care to be indicated at the time of discharge from [facility].     Kela Millin, M.D.     ACV/MEDQ  D:  02/09/2010  T:  02/09/2010  Job:  [id number]  cc:   Dr. Yetta Barre with [facility]  Electronically Signed by Donnalee Curry M.D. on 03/16/2010 05:58:03 PM Electronically Signed by Donnalee Curry M.D. on 03/16/2010 07:32:29 PM

## 2010-04-14 LAB — BASIC METABOLIC PANEL
CO2: 23 mEq/L (ref 19–32)
Calcium: 9.4 mg/dL (ref 8.4–10.5)
GFR calc Af Amer: >60 mL/min (ref >60)
GFR calc non Af Amer: >60 mL/min (ref >60)
Glucose, Bld: 96 mg/dL (ref 70–99)
Potassium: 3.7 mEq/L (ref 3.5–5.1)
Sodium: 136 mEq/L (ref 135–145)
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
## 2010-07-15 ENCOUNTER — Other Ambulatory Visit: Payer: Self-pay | Admitting: Internal Medicine

## 2010-08-25 LAB — BASIC METABOLIC PANEL: Creatinine: 1.1 mg/dL (ref <=1.1)

## 2010-10-04 DIAGNOSIS — F32A Depression, unspecified: Secondary | ICD-10-CM | POA: Insufficient documentation

## 2010-10-04 DIAGNOSIS — F329 Major depressive disorder, single episode, unspecified: Secondary | ICD-10-CM | POA: Insufficient documentation

## 2010-10-04 DIAGNOSIS — G4700 Insomnia, unspecified: Secondary | ICD-10-CM | POA: Insufficient documentation

## 2010-10-04 DIAGNOSIS — K625 Hemorrhage of anus and rectum: Secondary | ICD-10-CM | POA: Insufficient documentation

## 2010-10-04 DIAGNOSIS — F411 Generalized anxiety disorder: Secondary | ICD-10-CM | POA: Insufficient documentation

## 2010-11-28 ENCOUNTER — Other Ambulatory Visit: Payer: Self-pay | Admitting: Internal Medicine

## 2011-06-06 ENCOUNTER — Ambulatory Visit: Payer: 59

## 2011-06-06 ENCOUNTER — Ambulatory Visit (INDEPENDENT_AMBULATORY_CARE_PROVIDER_SITE_OTHER): Payer: 59 | Admitting: Family Medicine

## 2011-06-06 VITALS — BP 117/73 | HR 87 | Temp 98.3°F | Resp 18 | Ht 64.25 in | Wt 186.4 lb

## 2011-06-06 DIAGNOSIS — R059 Cough, unspecified: Secondary | ICD-10-CM

## 2011-06-06 DIAGNOSIS — R05 Cough: Secondary | ICD-10-CM

## 2011-06-06 DIAGNOSIS — J40 Bronchitis, not specified as acute or chronic: Secondary | ICD-10-CM

## 2011-06-06 LAB — POCT CBC
HCT, POC: 39.9 % (ref 37.7–47.9)
Hemoglobin: 13.6 g/dL (ref 12.2–16.2)
Lymph, poc: 1.9 (ref 0.6–3.4)
MCH, POC: 32.1 pg — AB (ref 27–31.2)
MCHC: 34.1 g/dL (ref 31.8–35.4)
POC LYMPH PERCENT: 36 %L (ref 10–50)
RDW, POC: 13.4 %
WBC: 5.4 10*3/uL (ref 4.6–10.2)

## 2011-06-06 MED ORDER — HYDROCODONE-HOMATROPINE 5-1.5 MG/5ML PO SYRP: 5.0000 mL | ORAL_SOLUTION | Freq: Three times a day (TID) | ORAL | Status: AC | PRN

## 2011-06-06 MED ORDER — AZITHROMYCIN 250 MG PO TABS: ORAL_TABLET | ORAL | Status: AC

## 2011-06-06 MED ORDER — ALBUTEROL SULFATE HFA 108 (90 BASE) MCG/ACT IN AERS: 2.0000 | INHALATION_SPRAY | Freq: Four times a day (QID) | RESPIRATORY_TRACT | Status: DC | PRN

## 2011-06-06 NOTE — Progress Notes (Signed)
Subjective: 61 year old lady with history of one week of upper respiratory tract infection. The developed cough. She has a constant rattling in her chest pressure when she lies down at night. She gets coughing and wheezing and rattling. She does not smoke. She is not blowing any mucus out of her nose. Her ears are little stuffy.  Objective:  TMs normal. Throat clear. Neck supple without significant nodes. Chest has rales in the right lower lobe. She has some scattered rhonchi. The cough is very wet deep sounding. No wheezing is audible right now. Her heart was regular without murmurs.  Assessment: Bronchitis, rule out pneumonia  Plan: Check CBC and chest x-ray [facility] reading (PRIMARY) by  Dr. Alwyn Ren Chest x-ray: Lungs appear clear. Breast implants are visible. Results for orders placed in visit on 06/06/11  POCT CBC      Component Value Range   WBC 5.4  4.6 - 10.2 (K/uL)   Lymph, poc 1.9  0.6 - 3.4    POC LYMPH PERCENT 36.0  10 - 50 (%L)   MID (cbc) 1.0 (*) 0 - 0.9    POC MID % 17.9 (*) 0 - 12 (%M)   POC Granulocyte 2.5  2 - 6.9    Granulocyte percent 46.1  37 - 80 (%G)   RBC 4.24  4.04 - 5.48 (M/uL)   Hemoglobin 13.6  12.2 - 16.2 (g/dL)   HCT, POC 16.1  09.6 - 47.9 (%)   MCV 94.2  80 - 97 (fL)   MCH, POC 32.1 (*) 27 - 31.2 (pg)   MCHC 34.1  31.8 - 35.4 (g/dL)   RDW, POC 04.5     Platelet Count, POC 244  142 - 424 (K/uL)   MPV 7.9  0 - 99.8 (fL)   .

## 2011-06-06 NOTE — Patient Instructions (Signed)

## 2011-08-16 ENCOUNTER — Other Ambulatory Visit: Payer: Self-pay | Admitting: Family Medicine

## 2011-08-16 ENCOUNTER — Encounter: Payer: Self-pay | Admitting: Family Medicine

## 2011-08-16 ENCOUNTER — Ambulatory Visit (INDEPENDENT_AMBULATORY_CARE_PROVIDER_SITE_OTHER): Payer: 59 | Admitting: Family Medicine

## 2011-08-16 VITALS — BP 131/85 | HR 74 | Resp 18 | Wt 198.0 lb

## 2011-08-16 DIAGNOSIS — R52 Pain, unspecified: Secondary | ICD-10-CM

## 2011-08-16 DIAGNOSIS — I10 Essential (primary) hypertension: Secondary | ICD-10-CM

## 2011-08-16 DIAGNOSIS — M6530 Trigger finger, unspecified finger: Secondary | ICD-10-CM

## 2011-08-16 DIAGNOSIS — L650 Telogen effluvium: Secondary | ICD-10-CM | POA: Insufficient documentation

## 2011-08-16 DIAGNOSIS — E669 Obesity, unspecified: Secondary | ICD-10-CM

## 2011-08-16 MED ORDER — TRAMADOL HCL 50 MG PO TABS: 50.0000 mg | ORAL_TABLET | Freq: Three times a day (TID) | ORAL | Status: DC | PRN

## 2011-08-16 MED ORDER — DICLOFENAC SODIUM 75 MG PO TBEC: 75.0000 mg | DELAYED_RELEASE_TABLET | Freq: Two times a day (BID) | ORAL | Status: DC

## 2011-08-16 MED ORDER — CYCLOBENZAPRINE HCL 5 MG PO TABS: 5.0000 mg | ORAL_TABLET | Freq: Every evening | ORAL | Status: DC | PRN

## 2011-08-16 NOTE — Patient Instructions (Addendum)
Trigger Finger Trigger finger (digital tendinitis and stenosing tenosynovitis) is a common disorder that causes an often painful catching of the fingers or thumb. It occurs as a clicking, snapping or locking of a finger in the palm of the hand. The reason for this is that there is a problem with the tendons which flex the fingers sliding smoothly through their sheaths. The cause of this may be inflammation of the tendon and sheath, or from a thickening or nodule in the tendon. The condition may occur in any finger or a couple fingers at the same time. The cause may be overuse while doing the same activity over and over again with your hands.  Tendons are the tough cords that connect the muscles to bones. Muscles and tendons are part of the system which allows your body to move. When muscles contract in the forearm on the palm side, they pull the tendons toward the elbow and cause the fingers and thumb to bend (flex) toward the palm. These are the flexor tendons. The tendons slide through a slippery smooth membrane (synovium) which is called the tendon sheath. The sheaths have areas of tough fibrous tissues surrounding them which hold the tendons close to the bone. These are called pulleys because they work like a pulley. The first pulley is in the palm of the hand near the crease which runs across your palm. If the area of the tendon thickening is near the pulley, the tendon cannot slide smoothly through the pulley and this causes the trigger finger. The finger may lock with the finger curled or suddenly straighten out with a snap. This is more common in patients with rheumatoid arthritis and diabetes. Left untreated, the condition may get worse to the point where the finger becomes locked in flexion, like making a fist, or less commonly locked with the finger straightened out. DIAGNOSIS  Your caregiver will easily make this diagnosis on examination. TREATMENT   Splinting for 6 to 8 weeks of time may be  helpful. Use the splints as your caregiver suggests.   Heat used for twenty minutes at least four times a day followed by ice packs for twenty minutes unless directed otherwise by your caregiver may be helpful. If you find either heat or cold seems to be making the problem worse, quit using them and ask your caregiver for directions.   Cortisone injections along with splinting may speed up recovery. Several injections may be required. Cortisone may give relief after one injection.   Only take over-the-counter or prescription medicines for pain, discomfort, or fever as directed by your caregiver.   Surgery is another treatment that may be used if conservative treatments using injection and splinting does not work. Surgery can be minor without incisions (a cut does not have to be made) and can be done with a needle through the skin. No stitches are needed and most patients may return to work the same day.   Other surgical choices involve an open procedure where the surgeon opens the hand through a small incision (cut) and cuts the pulley so the tendon can again slide smoothly. Your hand will still work fine. This small operation requires stitches and the recovery will be a little longer and the incisions will need to be protected until completely healed. You may have to limit your activities for up to 6 months.   Occupational or hand therapy may be required if there is stiffness remaining in the finger.  RISKS AND COMPLICATIONS Complications are uncommon but   some problems that may occur are:  Recurrence of the trigger finger. This does not mean that the surgery was not well done. It simply means that you may have formed scar tissue following surgery that causes the problem to reoccur.   Infection which could ruin the results of the surgery and can result in a finger which is frozen and can not move normally.   Nerve injury is possible which could result in permanent numbness of one or more fingers.   CARE AFTER SURGERY  Elevate your hand above your heart and use ice as instructed.   Follow instructions regarding finger motion/exercise.   Keep the surgical wound dry for at least 48 hrs or longer if instructed.   Keep your follow-up appointments.   Return to work and normal activities as instructed.  SEEK IMMEDIATE MEDICAL CARE IF:  Your problems are getting worse or you do not obtain relief from the treatment. Document Released: 10/17/2003 Document Revised: 12/16/2010 Document Reviewed: 06/10/2008 [employer] Patient Information 2012 [employer]. 

## 2011-08-16 NOTE — Addendum Note (Signed)
Addended by: Laren Boom on: 08/16/2011 05:09 PM   Modules accepted: Orders

## 2011-08-16 NOTE — Progress Notes (Addendum)
CC: Donna Sexton is a 61 y.o. female is here for Hyperlipidemia, Hypertension and Hand Pain   Subjective: HPI: Here to establish care with out clinic, former patient of mine at [facility].  -HTN: Amlodipine 10mg , Labetolol 200mg  TID. She has been taking her blood pressure at home with a wrist cuff. She's unsure of any specific values as positive that systolics have been below 140 and diastolics below 90. She denies any motor or sensory disturbances, chest pain, shortness of breath, orthopnea, nor peripheral edema.  -Acute Hand Pain: She's been experiencing left middle finger discomfort for the past 3 weeks. Almost daily basis it's been found in a flexed position when she wakes up in the morning. She has to physically extend it with her other hand and before it becomes loose. She denies any weakness or swelling of the extremity hand or finger. She denies any recent trauma. Nothing other than described above seems to make it better or worse.  -Body aches: She complains of months of almost daily body aches that seem to migrate around her body but predominantly reside in the lower neck and sometimes on the lateral buttocks. The sites most discomforting for her seems to be in both hands she localizes the pain to the MCP joints in both hands. She has a hard time describing her central discomfort as soft tissue discomfort or more joint pain.  She's been taking acetaminophen with only mild improvement of her discomfort. She's taking high-dose ibuprofen in the past without improvement he has also taken Ultram to supplement without much improvement. She gets moderate improvement with Flexeril that she uses before she goes to bed. There doesn't seem to be any connection to time of day. Other than what is describes above nothing since then it better or worse.  She said she has mild central weakness but no peripheral weakness she admits to having trouble getting up from a seated position and the trouble with chronic  stairs nor doing her hair.    Review Of Systems Outlined In HPI  Past Medical History  Diagnosis Date  . HYPERLIPIDEMIA 10/29/2007    Qualifier: Diagnosis of  By: Yetta Barre MD, Bernadene Bell.   . BIPOLAR DISORDER UNSPECIFIED 10/29/2007    Qualifier: Diagnosis of  By: Yetta Barre MD, Bernadene Bell.   Marland Kitchen COMMON MIGRAINE 12/29/2008    Qualifier: Diagnosis of  By: Yetta Barre MD, Bernadene Bell.   . HYPERTENSION, BENIGN ESSENTIAL 10/29/2007    Qualifier: Diagnosis of  By: Yetta Barre MD, Bernadene Bell.   Marland Kitchen GERD 07/28/2009    Qualifier: Diagnosis of  By: Felicity Coyer MD, Raenette Rover OVERACTIVE BLADDER 02/21/2008    Qualifier: Diagnosis of  By: Jonny Ruiz MD, Len Blalock   . OSTEOPENIA 10/30/2007    Qualifier: Diagnosis of  By: Yetta Barre MD, Bernadene Bell.   . ASTHMA NOS W/ACUTE EXACERBATION 01/13/2010    Qualifier: Diagnosis of  By: Yetta Barre MD, Bernadene Bell.   Marland Kitchen SLEEP APNEA 12/31/2009    Qualifier: Diagnosis of  By: Yetta Barre MD, Bernadene Bell.      Family History  Problem Relation Age of Onset  . Heart attack Mother   . Hypertension Mother   . Diabetes Mother   . Heart attack Father   . Hypertension Father   . Alcohol abuse Maternal Uncle      History  Substance Use Topics  . Smoking status: Never Smoker   . Smokeless tobacco: Never Used  . Alcohol Use: No     Objective: Filed Vitals:  08/16/11 1453  BP: 131/85  Pulse: 74  Resp: 18    General: Alert and Oriented, No Acute Distress HEENT: Pupils equal, round, reactive to light. Conjunctivae clear. Moist mucous membranes,  Neck supple without palpable lymphadenopathy nor abnormal masses. Lungs:  Comfortable work of breathing. Good air movement. Cardiac: Regular rate and rhythm. Extremities: No peripheral edema.  Strong peripheral pulses.  No evidence of central nor peripheral muscle weakness or restricted ROM.  Passive/active ROM of left 3rd digit results in a catching in the flexor tendons of the palm. Mental Status: No depression, anxiety, nor agitation. Skin: Warm and dry.  Assessment &  Plan: Donna Sexton was seen today for hyperlipidemia, hypertension and hand pain.  Diagnoses and associated orders for this visit:  Hypertension, benign essential  Body aches - Sed Rate (ESR) - C-reactive protein - Rheumatoid Factor - Antinuclear Antib (ANA) - CK (Creatine Kinase) - cyclobenzaprine (FLEXERIL) 5 MG tablet; Take 1 tablet (5 mg total) by mouth at bedtime as needed for muscle spasms. - Discontinue: traMADol (ULTRAM) 50 MG tablet; Take 1 tablet (50 mg total) by mouth every 8 (eight) hours as needed for pain. - diclofenac (VOLTAREN) 75 MG EC tablet; Take 1 tablet (75 mg total) by mouth 2 (two) times daily with a meal.  Trigger finger     For her trigger finger I have refer to Dr. Karie Schwalbe. in the sports medicine clinic for evaluation of possible steroid injection. Her blood pressure is at goal right now and requires no modification. Due to the persistence of her body aches I like to rule out muscle pathology with a ESR CRP and CK. Additionally to rule out rheumatological disease leading rheumatoid factor and ANA. I encouraged her to use diclofenac and Flexeril on an as-needed basis in order to preserve her mobility. A followup of her over the phone with these results and asked her to return to see me in approximately one month at that time I'd like to get a fasting lipid panel.  30 minutes spent in face-to-face visit today of which at least 80% was counseling or coordinating   At the end of the visit patient requested bariatrics referral for weight management, referral to Dr. Cathey Endow will be placed   isReturn in about 1 month (around 09/16/2011) for Donna Sexton F/U, Return in 1-2 weeks for an injection with Dr. Karie Schwalbe..  Requested Prescriptions   Signed Prescriptions Disp Refills  . cyclobenzaprine (FLEXERIL) 5 MG tablet 60 tablet 2    Sig: Take 1 tablet (5 mg total) by mouth at bedtime as needed for muscle spasms.  . diclofenac (VOLTAREN) 75 MG EC tablet 60 tablet 0    Sig: Take 1 tablet (75 mg  total) by mouth 2 (two) times daily with a meal.

## 2011-08-17 LAB — CK: Total CK: 54 U/L (ref 7–177)

## 2011-08-17 LAB — ANTI-NUCLEAR AB-TITER (ANA TITER): ANA Titer 1: 1:40 {titer} — ABNORMAL HIGH

## 2011-08-17 LAB — ANA: Anti Nuclear Antibody(ANA): POSITIVE — AB

## 2011-08-22 ENCOUNTER — Telehealth: Payer: Self-pay | Admitting: Family Medicine

## 2011-08-22 MED ORDER — GABAPENTIN 300 MG PO CAPS: 300.0000 mg | ORAL_CAPSULE | Freq: Every day | ORAL | Status: DC

## 2011-08-22 NOTE — Telephone Encounter (Signed)
Called patient regarding normal labs from last week.  I'm suspicious that her pain is sounding more like fibromyalgia.  Diclofenac from last week not helping.  Trial of gabapentin, will titrate up if tolerated.  Has taken SSRIs in the past which exacerbated psych issues.

## 2011-09-07 ENCOUNTER — Telehealth: Payer: Self-pay | Admitting: *Deleted

## 2011-09-26 ENCOUNTER — Ambulatory Visit (INDEPENDENT_AMBULATORY_CARE_PROVIDER_SITE_OTHER): Payer: 59 | Admitting: Family Medicine

## 2011-09-26 ENCOUNTER — Encounter: Payer: Self-pay | Admitting: Family Medicine

## 2011-09-26 VITALS — BP 128/89 | HR 81 | Wt 200.0 lb

## 2011-09-26 DIAGNOSIS — E669 Obesity, unspecified: Secondary | ICD-10-CM | POA: Insufficient documentation

## 2011-09-26 DIAGNOSIS — Z23 Encounter for immunization: Secondary | ICD-10-CM

## 2011-09-26 DIAGNOSIS — K589 Irritable bowel syndrome without diarrhea: Secondary | ICD-10-CM

## 2011-09-26 DIAGNOSIS — L650 Telogen effluvium: Secondary | ICD-10-CM

## 2011-09-26 DIAGNOSIS — Z298 Encounter for other specified prophylactic measures: Secondary | ICD-10-CM

## 2011-09-26 DIAGNOSIS — I10 Essential (primary) hypertension: Secondary | ICD-10-CM

## 2011-09-26 NOTE — Patient Instructions (Signed)
Powdered Psylum (Fiber): One heaping tablespoon daily, titrate up to three a day slowly.

## 2011-09-26 NOTE — Progress Notes (Signed)
CC: Donna Sexton is a 61 y.o. female is here for Hypertension   Subjective: HPI:  --HTN:  continues on labetalol and amlodipine without any recent outside blood pressures to report. Denies motor or sensory disturbances, chest pain, shortness of breath, orthopnea, irregular heartbeat, peripheral edema.  --Obesity: She was directed to Dr. Ovidio Kin office for bariatric consultation. After discussing likely treatment options with her psychiatrist her psychiatrist urged against surgical or pharmaceutical management for her weight control. The patient is now in agreement with this and will continue with dietary and physical activity measures. She's fearful that appetite suppressants cause her manic episodes with her bipolar disorder and that surgical interventions could cause changes in the obstruction of her psych meds.  --bowel irregularities: She tells me for decades she has episodes where she'll have a lower abdominal discomfort this followed soon by the passage of loose stools. The lower abdominal pain abates after evacuation of these loose stools. These episodes can come on anytime but have never occurred in the middle of the night. She attributes these episodes to times of increased psychologic stress. She tells me they come on almost a weekly basis and that they certainly, more than 3 times a month and have been present for more than 3 months. She denies a history of tarry or blood in her stool, she's had a colonoscopy within the last year which was overall unremarkable. She denies any unintentional weight loss. She denies any gynecological or genitourinary complaints. She denies any specific food intolerances. There been no interventions for this as of yet  Review Of Systems Outlined In HPI  Past Medical History  Diagnosis Date  . HYPERLIPIDEMIA 10/29/2007    Qualifier: Diagnosis of  By: Yetta Barre MD, Bernadene Bell.   . BIPOLAR DISORDER UNSPECIFIED 10/29/2007    Qualifier: Diagnosis of  By: Yetta Barre MD,  Bernadene Bell.   Marland Kitchen COMMON MIGRAINE 12/29/2008    Qualifier: Diagnosis of  By: Yetta Barre MD, Bernadene Bell.   . HYPERTENSION, BENIGN ESSENTIAL 10/29/2007    Qualifier: Diagnosis of  By: Yetta Barre MD, Bernadene Bell.   Marland Kitchen GERD 07/28/2009    Qualifier: Diagnosis of  By: Felicity Coyer MD, Raenette Rover OVERACTIVE BLADDER 02/21/2008    Qualifier: Diagnosis of  By: Jonny Ruiz MD, Len Blalock   . OSTEOPENIA 10/30/2007    Qualifier: Diagnosis of  By: Yetta Barre MD, Bernadene Bell.   . ASTHMA NOS W/ACUTE EXACERBATION 01/13/2010    Qualifier: Diagnosis of  By: Yetta Barre MD, Bernadene Bell.   Marland Kitchen SLEEP APNEA 12/31/2009    Qualifier: Diagnosis of  By: Yetta Barre MD, Bernadene Bell.      Family History  Problem Relation Age of Onset  . Heart attack Mother   . Hypertension Mother   . Diabetes Mother   . Heart attack Father   . Hypertension Father   . Alcohol abuse Maternal Uncle      History  Substance Use Topics  . Smoking status: Never Smoker   . Smokeless tobacco: Never Used  . Alcohol Use: No     Objective: Filed Vitals:   09/26/11 1402  BP: 128/89  Pulse: 81    General: Alert and Oriented, No Acute Distress   Neck supple without palpable lymphadenopathy nor abnormal masses. Lungs: Clear to auscultation bilaterally, no wheezing/ronchi/rales.  Comfortable work of breathing. Good air movement. Cardiac: Regular rate and rhythm. Normal S1/S2.  No murmurs, rubs, nor gallops.   Abdomen: Normal bowel sounds, soft and non tender without palpable masses. Mental Status: No  depression, anxiety, nor agitation. Skin: Warm and dry.  Assessment & Plan: Donna Sexton was seen today for hypertension.  Diagnoses and associated orders for this visit:  Need for prophylactic immunotherapy - Flu vaccine greater than or equal to 3yo preservative free IM  Irritable bowel disease  Obesity (bmi 30.0-34.9)  Telogen effluvium  Hypertension, benign essential    Her hair loss that occurred following her cholecystectomy is no longer occurring at her hair appears much more  full, dense and is been no hair loss to report. Blood pressure is controlled no changes medications. Her bowel issues. Feels like her to go bowel disease diarrhea subtype start with fiber supplementation a daily basis. Asked to return in 3 months. We'll continue with diet and exercise interventions for weight loss.  Return in about 3 months (around 12/26/2011).  Requested Prescriptions    No prescriptions requested or ordered in this encounter

## 2011-09-29 ENCOUNTER — Encounter: Payer: Self-pay | Admitting: Family Medicine

## 2011-09-29 DIAGNOSIS — F319 Bipolar disorder, unspecified: Secondary | ICD-10-CM | POA: Insufficient documentation

## 2011-09-29 DIAGNOSIS — I10 Essential (primary) hypertension: Secondary | ICD-10-CM | POA: Insufficient documentation

## 2011-09-29 DIAGNOSIS — E785 Hyperlipidemia, unspecified: Secondary | ICD-10-CM | POA: Insufficient documentation

## 2011-11-26 ENCOUNTER — Ambulatory Visit (INDEPENDENT_AMBULATORY_CARE_PROVIDER_SITE_OTHER): Payer: Medicare Other | Admitting: Family Medicine

## 2011-11-26 VITALS — BP 113/76 | HR 80 | Temp 98.0°F | Resp 16 | Ht 64.5 in | Wt 202.8 lb

## 2011-11-26 DIAGNOSIS — R110 Nausea: Secondary | ICD-10-CM

## 2011-11-26 DIAGNOSIS — G43109 Migraine with aura, not intractable, without status migrainosus: Secondary | ICD-10-CM

## 2011-11-26 MED ORDER — HYDROCODONE-ACETAMINOPHEN 5-325 MG PO TABS: 1.0000 | ORAL_TABLET | Freq: Four times a day (QID) | ORAL | Status: DC | PRN

## 2011-11-26 MED ORDER — ONDANSETRON 4 MG PO TBDP: 4.0000 mg | ORAL_TABLET | Freq: Three times a day (TID) | ORAL | Status: DC | PRN

## 2011-11-26 NOTE — Patient Instructions (Signed)
Follow up with your primary care doctor about the recent recurrence of migraines. Return to the clinic or go to the nearest emergency room if any of your symptoms worsen or new symptoms occur.   Migraine Headache A migraine headache is an intense, throbbing pain on one or both sides of your head. A migraine can last for 30 minutes to several hours. CAUSES  The exact cause of a migraine headache is not always known. However, a migraine may be caused when nerves in the brain become irritated and release chemicals that cause inflammation. This causes pain. SYMPTOMS  Pain on one or both sides of your head.  Pulsating or throbbing pain.  Severe pain that prevents daily activities.  Pain that is aggravated by any physical activity.  Nausea, vomiting, or both.  Dizziness.  Pain with exposure to bright lights, loud noises, or activity.  General sensitivity to bright lights, loud noises, or smells. Before you get a migraine, you may get warning signs that a migraine is coming (aura). An aura may include:  Seeing flashing lights.  Seeing bright spots, halos, or zig-zag lines.  Having tunnel vision or blurred vision.  Having feelings of numbness or tingling.  Having trouble talking.  Having muscle weakness. MIGRAINE TRIGGERS  Alcohol.  Smoking.  Stress.  Menstruation.  Aged cheeses.  Foods or drinks that contain nitrates, glutamate, aspartame, or tyramine.  Lack of sleep.  Chocolate.  Caffeine.  Hunger.  Physical exertion.  Fatigue.  Medicines used to treat chest pain (nitroglycerine), birth control pills, estrogen, and some blood pressure medicines. DIAGNOSIS  A migraine headache is often diagnosed based on:  Symptoms.  Physical examination.  A CT scan or MRI of your head. TREATMENT Medicines may be given for pain and nausea. Medicines can also be given to help prevent recurrent migraines.  HOME CARE INSTRUCTIONS  Only take over-the-counter or  prescription medicines for pain or discomfort as directed by your caregiver. The use of long-term narcotics is not recommended.  Lie down in a dark, quiet room when you have a migraine.  Keep a journal to find out what may trigger your migraine headaches. For example, write down:  What you eat and drink.  How much sleep you get.  Any change to your diet or medicines.  Limit alcohol consumption.  Quit smoking if you smoke.  Get 7 to 9 hours of sleep, or as recommended by your caregiver.  Limit stress.  Keep lights dim if bright lights bother you and make your migraines worse. SEEK IMMEDIATE MEDICAL CARE IF:   Your migraine becomes severe.  You have a fever.  You have a stiff neck.  You have vision loss.  You have muscular weakness or loss of muscle control.  You start losing your balance or have trouble walking.  You feel faint or pass out.  You have severe symptoms that are different from your first symptoms. MAKE SURE YOU:   Understand these instructions.  Will watch your condition.  Will get help right away if you are not doing well or get worse. Document Released: 12/27/2004 Document Revised: 03/21/2011 Document Reviewed: 12/17/2010 [facility] Patient Information 2013 [place], [place].

## 2011-11-26 NOTE — Progress Notes (Signed)
Subjective:    Patient ID: Donna Sexton, female    DOB: [birth year], 61 y.o.   MRN: [id number]  HPI Donna Sexton is a 61 y.o. female Complains of migraine symptoms past 2 days.  Temples and around to back of head.  Migraines since 61yo, but had improved for several years.  Headaches started to return 1 month ago.  Come and go. Tx: alleve, tylenol, advil, naprosyn, BC powder. Usually wear off in few days.    Current headache started 2 days ago - out running errands - cold air may have triggered headache. Did have "sparkles in vision" about 1 hour prior - typical aura.  Current ha feels like typical migraine, sensitive to light, nausea without vomiting. Tried otc meds without relief.    Has had relief with hydrocodone in past.   Review of Systems  Constitutional: Negative for fever and chills.  Gastrointestinal: Positive for nausea.  Neurological: Positive for headaches. Negative for tremors, syncope, speech difficulty and weakness.       Objective:   Physical Exam  Constitutional: She is oriented to person, place, and time. She appears well-developed and well-nourished. No distress.  HENT:  Head: Normocephalic and atraumatic.  Right Ear: Hearing, tympanic membrane, external ear and ear canal normal.  Left Ear: Hearing, tympanic membrane, external ear and ear canal normal.  Nose: Nose normal.  Mouth/Throat: Oropharynx is clear and moist. No oropharyngeal exudate.  Eyes: Conjunctivae normal and EOM are normal. Pupils are equal, round, and reactive to light.  Cardiovascular: Normal rate, regular rhythm, normal heart sounds and intact distal pulses.   No murmur heard. Pulmonary/Chest: Effort normal and breath sounds normal. No respiratory distress. She has no wheezes. She has no rhonchi.  Neurological: She is alert and oriented to person, place, and time. She has normal strength. No cranial nerve deficit or sensory deficit. She displays a negative Romberg sign. Coordination and gait  normal.       No pronator drift.   Skin: Skin is warm and dry. No rash noted.  Psychiatric: She has a normal mood and affect. Her behavior is normal.       Assessment & Plan:  Donna Sexton is a 62 y.o. female 1. Nausea  ondansetron (ZOFRAN ODT) 4 MG disintegrating tablet  2. Migraine headache with aura  ondansetron (ZOFRAN ODT) 4 MG disintegrating tablet, HYDROcodone-acetaminophen (NORCO/VICODIN) 5-325 MG per tablet   Typical migraine.  Some increase in frequency of sx's, but reassuring exam.  Zofran, short course lortab and follow up with primary provider. rtc precautions.   Patient Instructions  Follow up with your primary care doctor about the recent recurrence of migraines. Return to the clinic or go to the nearest emergency room if any of your symptoms worsen or new symptoms occur.   Migraine Headache A migraine headache is an intense, throbbing pain on one or both sides of your head. A migraine can last for 30 minutes to several hours. CAUSES  The exact cause of a migraine headache is not always known. However, a migraine may be caused when nerves in the brain become irritated and release chemicals that cause inflammation. This causes pain. SYMPTOMS  Pain on one or both sides of your head.  Pulsating or throbbing pain.  Severe pain that prevents daily activities.  Pain that is aggravated by any physical activity.  Nausea, vomiting, or both.  Dizziness.  Pain with exposure to bright lights, loud noises, or activity.  General sensitivity to bright lights, loud noises,  or smells. Before you get a migraine, you may get warning signs that a migraine is coming (aura). An aura may include:  Seeing flashing lights.  Seeing bright spots, halos, or zig-zag lines.  Having tunnel vision or blurred vision.  Having feelings of numbness or tingling.  Having trouble talking.  Having muscle weakness. MIGRAINE  TRIGGERS  Alcohol.  Smoking.  Stress.  Menstruation.  Aged cheeses.  Foods or drinks that contain nitrates, glutamate, aspartame, or tyramine.  Lack of sleep.  Chocolate.  Caffeine.  Hunger.  Physical exertion.  Fatigue.  Medicines used to treat chest pain (nitroglycerine), birth control pills, estrogen, and some blood pressure medicines. DIAGNOSIS  A migraine headache is often diagnosed based on:  Symptoms.  Physical examination.  A CT scan or MRI of your head. TREATMENT Medicines may be given for pain and nausea. Medicines can also be given to help prevent recurrent migraines.  HOME CARE INSTRUCTIONS  Only take over-the-counter or prescription medicines for pain or discomfort as directed by your caregiver. The use of long-term narcotics is not recommended.  Lie down in a dark, quiet room when you have a migraine.  Keep a journal to find out what may trigger your migraine headaches. For example, write down:  What you eat and drink.  How much sleep you get.  Any change to your diet or medicines.  Limit alcohol consumption.  Quit smoking if you smoke.  Get 7 to 9 hours of sleep, or as recommended by your caregiver.  Limit stress.  Keep lights dim if bright lights bother you and make your migraines worse. SEEK IMMEDIATE MEDICAL CARE IF:   Your migraine becomes severe.  You have a fever.  You have a stiff neck.  You have vision loss.  You have muscular weakness or loss of muscle control.  You start losing your balance or have trouble walking.  You feel faint or pass out.  You have severe symptoms that are different from your first symptoms. MAKE SURE YOU:   Understand these instructions.  Will watch your condition.  Will get help right away if you are not doing well or get worse. Document Released: 12/27/2004 Document Revised: 03/21/2011 Document Reviewed: 12/17/2010 [facility] Patient Information 2013 [place], [place].

## 2011-12-26 ENCOUNTER — Other Ambulatory Visit: Payer: Self-pay | Admitting: Gynecology

## 2011-12-26 ENCOUNTER — Ambulatory Visit (INDEPENDENT_AMBULATORY_CARE_PROVIDER_SITE_OTHER): Payer: 59 | Admitting: Family Medicine

## 2011-12-26 VITALS — BP 125/85 | HR 76 | Ht 65.0 in | Wt 201.0 lb

## 2011-12-26 DIAGNOSIS — I10 Essential (primary) hypertension: Secondary | ICD-10-CM

## 2011-12-26 DIAGNOSIS — E785 Hyperlipidemia, unspecified: Secondary | ICD-10-CM

## 2011-12-26 MED ORDER — LABETALOL HCL 200 MG PO TABS: ORAL_TABLET | ORAL | Status: DC

## 2011-12-26 MED ORDER — ATORVASTATIN CALCIUM 40 MG PO TABS: 40.0000 mg | ORAL_TABLET | Freq: Every day | ORAL | Status: DC

## 2011-12-26 MED ORDER — AMLODIPINE BESYLATE 10 MG PO TABS: 10.0000 mg | ORAL_TABLET | Freq: Every day | ORAL | Status: DC

## 2011-12-26 NOTE — Patient Instructions (Addendum)
Raynaud's Syndrome  Raynaud's Syndrome is a disorder of the blood vessels in your hands and feet. It occurs when small arteries of the arms/hands or legs/feet become sensitive to cold or emotional upset. This causes the arteries to constrict, or narrow, and reduces blood flow to the area. The color in the fingers or toes changes from white to bluish to red and this is not usually painful. There may be numbness and tingling. Sores on the skin (ulcers) can form. Symptoms are usually relieved by warming.  HOME CARE INSTRUCTIONS     Avoid exposure to cold. Keep your whole body warm and dry. Dress in layers. Wear mittens or gloves when handling ice or frozen food and when outdoors. Use holders for glasses or cans containing cold drinks. If possible, stay indoors during cold weather.   Limit your use of caffeine. Switch to decaffeinated coffee, tea, and soda pop. Avoid chocolate.   Avoid smoking or being around cigarette smoke. Smoke will make symptoms worse.   Wear loose fitting socks and comfortable, roomy shoes.   Avoid vibrating tools and machinery.   If possible, avoid stressful and emotional situations. Exercise, meditation and yoga may help you cope with stress. Biofeedback may be useful.   Ask your caregiver about medicine (calcium channel blockers) that may control Raynaud's phenomena.  SEEK MEDICAL CARE IF:     Your discomfort becomes worse, despite conservative treatment.   You develop sores on your fingers and toes that do not heal.  Document Released: 12/25/1999 Document Revised: 03/21/2011 Document Reviewed: 01/01/2008  [employer] Patient Information 2013 [employer].

## 2011-12-26 NOTE — Progress Notes (Signed)
CC: Donna Sexton is a 61 y.o. female is here for Hypertension and med refills   Subjective: HPI:  Patient presents for routine followup, in good spirits  Hypertension: No outside blood pressures reports. Continues on amlodipine and labetalol. No formal exercise routine. Tries to watch what she eats including low-salt diet. Denies headaches, motor sensory disturbances, chest pain, shortness of breath, orthopnea, peripheral edema  Hyperlipidemia: Is been every year since we've checked her cholesterol she continues on atorvastatin without right upper quadrant pain myalgias nor skin or scleral discoloration. Denies limb claudication  She reports an occasional purpling discoloration of her toes that lasts for a day, comes and goes without warning. Nothing seems to make it better or worse. It is never associated with pain sometimes tingling of the affected toes. All toes equally affected but always occurs in only individual toe  Review Of Systems Outlined In HPI  Past Medical History  Diagnosis Date  . HYPERLIPIDEMIA 10/29/2007    Qualifier: Diagnosis of  By: Yetta Barre MD, Bernadene Bell.   . BIPOLAR DISORDER UNSPECIFIED 10/29/2007    Qualifier: Diagnosis of  By: Yetta Barre MD, Bernadene Bell.   Marland Kitchen COMMON MIGRAINE 12/29/2008    Qualifier: Diagnosis of  By: Yetta Barre MD, Bernadene Bell.   . HYPERTENSION, BENIGN ESSENTIAL 10/29/2007    Qualifier: Diagnosis of  By: Yetta Barre MD, Bernadene Bell.   Marland Kitchen GERD 07/28/2009    Qualifier: Diagnosis of  By: Felicity Coyer MD, Raenette Rover OVERACTIVE BLADDER 02/21/2008    Qualifier: Diagnosis of  By: Jonny Ruiz MD, Len Blalock   . OSTEOPENIA 10/30/2007    Qualifier: Diagnosis of  By: Yetta Barre MD, Bernadene Bell.   . ASTHMA NOS W/ACUTE EXACERBATION 01/13/2010    Qualifier: Diagnosis of  By: Yetta Barre MD, Bernadene Bell.   Marland Kitchen SLEEP APNEA 12/31/2009    Qualifier: Diagnosis of  By: Yetta Barre MD, Bernadene Bell.   Marland Kitchen ANEMIA 11/12/2007    Qualifier: Diagnosis of  By: Briscoe Burns CMA, Alvy Beal    . Arthritis   . Anxiety      Family History   Problem Relation Age of Onset  . Heart attack Mother   . Hypertension Mother   . Diabetes Mother   . Heart attack Father   . Hypertension Father   . Alcohol abuse Maternal Uncle      History  Substance Use Topics  . Smoking status: Never Smoker   . Smokeless tobacco: Never Used  . Alcohol Use: No     Objective: Filed Vitals:   12/26/11 1322  BP: 125/85  Pulse: 76    General: Alert and Oriented, No Acute Distress HEENT: Pupils equal, round, reactive to light. Conjunctivae clear.  Moist mucous membranes, pharynx without inflammation nor lesions.  Neck supple without palpable lymphadenopathy nor abnormal masses. Lungs: Clear to auscultation bilaterally, no wheezing/ronchi/rales.  Comfortable work of breathing. Good air movement. Cardiac: Regular rate and rhythm. Normal S1/S2.  No murmurs, rubs, nor gallops.   Extremities: No peripheral edema.  Strong peripheral pulses. Patient declines foot exam citing no discoloration at this time  Mental Status: No depression, anxiety, nor agitation. Skin: Warm and dry.  Assessment & Plan: Drake was seen today for hypertension and med refills.  Diagnoses and associated orders for this visit:  Essential hypertension - labetalol (NORMODYNE) 200 MG tablet; Two by mouth in the morning and one in the evening. - amLODipine (NORVASC) 10 MG tablet; Take 1 tablet (10 mg total) by mouth daily.  Hyperlipidemia - atorvastatin (LIPITOR) 40 MG  tablet; Take 1 tablet (40 mg total) by mouth daily. - Lipid panel    Hyperlipidemia: Clinically sounds controlled, checking lipid panel to ensure appropriate Lipitor dose Essential hypertension: Controlled, continue labetalol and amlodipine Believes she might have Reynalds affecting her toes, I've asked to return when she has discoloration to show next time it occurs   Return in about 3 months (around 03/25/2012).

## 2012-03-15 ENCOUNTER — Other Ambulatory Visit: Payer: Self-pay | Admitting: Family Medicine

## 2012-03-26 ENCOUNTER — Ambulatory Visit: Payer: 59 | Admitting: Family Medicine

## 2012-04-18 ENCOUNTER — Ambulatory Visit (INDEPENDENT_AMBULATORY_CARE_PROVIDER_SITE_OTHER): Payer: 59 | Admitting: Family Medicine

## 2012-04-18 ENCOUNTER — Encounter: Payer: Self-pay | Admitting: Family Medicine

## 2012-04-18 VITALS — BP 119/81 | HR 76 | Wt 200.0 lb

## 2012-04-18 DIAGNOSIS — Z283 Underimmunization status: Secondary | ICD-10-CM

## 2012-04-18 DIAGNOSIS — I10 Essential (primary) hypertension: Secondary | ICD-10-CM

## 2012-04-18 DIAGNOSIS — Z2839 Other underimmunization status: Secondary | ICD-10-CM

## 2012-04-18 DIAGNOSIS — R51 Headache: Secondary | ICD-10-CM

## 2012-04-18 DIAGNOSIS — E785 Hyperlipidemia, unspecified: Secondary | ICD-10-CM

## 2012-04-18 MED ORDER — BECLOMETHASONE DIPROPIONATE 80 MCG/ACT NA AERS: 2.0000 | INHALATION_SPRAY | Freq: Every day | NASAL | Status: DC

## 2012-04-18 MED ORDER — ZOSTER VACCINE LIVE 19400 UNT/0.65ML ~~LOC~~ SOLR: 0.6500 mL | Freq: Once | SUBCUTANEOUS | Status: DC

## 2012-04-18 NOTE — Progress Notes (Signed)
CC: Donna Sexton is a 62 y.o. female is here for cholesterol checked and pnuemonia and shingles vaccine   Subjective: HPI:  Followup hyperlipidemia: Last lipid panel over one year ago. She did not get her lipid panel in December for financial reasons. Tries to watch what she eats, no formal exercise program. Currently taking Lipitor and a daily basis without myalgias nor right upper quadrant pain. Denies chest pain, claudication  Followup hypertension: Reports daily compliance with labetalol and amlodipine. No outside blood pressures to report. Denies motor or sensory disturbances, eye headedness, orthopnea, peripheral edema, nor irregular heartbeat  Patient has questions regarding shingles vaccine and pneumococcal vaccine. Denies history of asthma, smoking, COPD, nor lung disease.  Patient complains of headache for the past 2 weeks. Ascribed as a pressure in the face and back of head. Mild severity in the back of the head and face in the morning, back of head pain resolves at the end of the day to turn into moderate facial pain. Nothing makes better or worse. Anti-inflammatories not helping. Pain is nonradiating. Denies fevers, rash, neck pain nor vision loss  Review Of Systems Outlined In HPI  Past Medical History  Diagnosis Date  . HYPERLIPIDEMIA 10/29/2007    Qualifier: Diagnosis of  By: Yetta Barre MD, Bernadene Bell.   . BIPOLAR DISORDER UNSPECIFIED 10/29/2007    Qualifier: Diagnosis of  By: Yetta Barre MD, Bernadene Bell.   Marland Kitchen COMMON MIGRAINE 12/29/2008    Qualifier: Diagnosis of  By: Yetta Barre MD, Bernadene Bell.   . HYPERTENSION, BENIGN ESSENTIAL 10/29/2007    Qualifier: Diagnosis of  By: Yetta Barre MD, Bernadene Bell.   Marland Kitchen GERD 07/28/2009    Qualifier: Diagnosis of  By: Felicity Coyer MD, Raenette Rover OVERACTIVE BLADDER 02/21/2008    Qualifier: Diagnosis of  By: Jonny Ruiz MD, Len Blalock   . OSTEOPENIA 10/30/2007    Qualifier: Diagnosis of  By: Yetta Barre MD, Bernadene Bell.   . ASTHMA NOS W/ACUTE EXACERBATION 01/13/2010    Qualifier: Diagnosis  of  By: Yetta Barre MD, Bernadene Bell.   Marland Kitchen SLEEP APNEA 12/31/2009    Qualifier: Diagnosis of  By: Yetta Barre MD, Bernadene Bell.   Marland Kitchen ANEMIA 11/12/2007    Qualifier: Diagnosis of  By: Briscoe Burns CMA, Alvy Beal    . Arthritis   . Anxiety      Family History  Problem Relation Age of Onset  . Heart attack Mother   . Hypertension Mother   . Diabetes Mother   . Heart attack Father   . Hypertension Father   . Alcohol abuse Maternal Uncle      History  Substance Use Topics  . Smoking status: Never Smoker   . Smokeless tobacco: Never Used  . Alcohol Use: No     Objective: Filed Vitals:   04/18/12 1314  BP: 119/81  Pulse: 76    General: Alert and Oriented, No Acute Distress HEENT: Pupils equal, round, reactive to light. Conjunctivae clear.  External ears unremarkable, canals clear with intact TMs with appropriate landmarks.  Middle ear appears open without effusion. Pink inferior turbinates.  Moist mucous membranes, pharynx without inflammation nor lesions.  Neck supple without palpable lymphadenopathy nor abnormal masses. Maxillary sinus tenderness to percussion Cranial nerves II through XII grossly intact Lungs: Clear to auscultation bilaterally, no wheezing/ronchi/rales.  Comfortable work of breathing. Good air movement. Cardiac: Regular rate and rhythm. Normal S1/S2.  No murmurs, rubs, nor gallops.   Extremities: No peripheral edema.  Strong peripheral pulses.  Mental Status: No depression, anxiety, nor  agitation. Skin: Warm and dry.  Assessment & Plan: Donna Sexton was seen today for cholesterol checked and pnuemonia and shingles vaccine.  Diagnoses and associated orders for this visit:  Hyperlipidemia - Lipid panel  Essential hypertension  Headache - Beclomethasone Dipropionate (QNASL) 80 MCG/ACT AERS; Place 2 sprays into the nose daily.  Immunization deficiency - zoster vaccine live, PF, (ZOSTAVAX) 16109 UNT/0.65ML injection; Inject 19,400 Units into the skin once.    Hyperlipidemia: Clinically  controlled, due for fasting lipid panel Essential hypertension: Controlled, continue labetalol and amlodipine Headache: Suspicion for sinusitis, start sample of qnasl Immunization counseling: No indication for Pneumovax at this time however discussed risks and benefits of taking Zostavax which she is interested in, prescription provided  Return in about 3 months (around 07/18/2012).

## 2012-04-30 ENCOUNTER — Telehealth: Payer: Self-pay | Admitting: *Deleted

## 2012-04-30 DIAGNOSIS — R51 Headache: Secondary | ICD-10-CM

## 2012-04-30 DIAGNOSIS — J309 Allergic rhinitis, unspecified: Secondary | ICD-10-CM

## 2012-04-30 MED ORDER — BECLOMETHASONE DIPROPIONATE 80 MCG/ACT NA AERS: 2.0000 | INHALATION_SPRAY | Freq: Every day | NASAL | Status: DC

## 2012-04-30 NOTE — Telephone Encounter (Signed)
Pt.notified

## 2012-04-30 NOTE — Telephone Encounter (Signed)
Patient calls and states that the sample of Qnasal you gave her is working and would like a rx sent to her pharmacy for this. Barry Dienes, LPN  [facility]

## 2012-04-30 NOTE — Telephone Encounter (Signed)
[facility]

## 2012-05-01 ENCOUNTER — Other Ambulatory Visit: Payer: Self-pay | Admitting: *Deleted

## 2012-05-01 DIAGNOSIS — R51 Headache: Secondary | ICD-10-CM

## 2012-05-01 DIAGNOSIS — J309 Allergic rhinitis, unspecified: Secondary | ICD-10-CM

## 2012-05-01 MED ORDER — BECLOMETHASONE DIPROPIONATE 80 MCG/ACT NA AERS: 2.0000 | INHALATION_SPRAY | Freq: Every day | NASAL | Status: DC

## 2012-05-02 ENCOUNTER — Telehealth: Payer: Self-pay | Admitting: *Deleted

## 2012-05-02 DIAGNOSIS — J302 Other seasonal allergic rhinitis: Secondary | ICD-10-CM

## 2012-05-02 MED ORDER — MOMETASONE FUROATE 50 MCG/ACT NA SUSP: NASAL | Status: DC

## 2012-05-02 NOTE — Telephone Encounter (Signed)
Nasonex sent to pharmacy, should be much cheaper and equally effective.

## 2012-05-02 NOTE — Telephone Encounter (Signed)
Pt calls and states the nasal spray that was sent to pharmacy her insurance will not cover and she can not afford it. Wants to know if there is anything else you could send in its place

## 2012-05-03 NOTE — Telephone Encounter (Signed)
Pt notified med sent to pharmacy. Kimberly Gordon, LPN  

## 2012-05-08 ENCOUNTER — Other Ambulatory Visit: Payer: Self-pay | Admitting: Family Medicine

## 2012-05-08 DIAGNOSIS — M62838 Other muscle spasm: Secondary | ICD-10-CM

## 2012-05-18 LAB — LIPID PANEL
HDL: 54 mg/dL (ref >39)
Triglycerides: 85 mg/dL (ref <150)

## 2012-05-23 ENCOUNTER — Other Ambulatory Visit: Payer: Self-pay

## 2012-05-23 DIAGNOSIS — Z1231 Encounter for screening mammogram for malignant neoplasm of breast: Secondary | ICD-10-CM

## 2012-06-14 ENCOUNTER — Ambulatory Visit
Admission: RE | Admit: 2012-06-14 | Discharge: 2012-06-14 | Disposition: A | Discharge status: Home / Self Care | Payer: Medicare Other | Source: Ambulatory Visit

## 2012-06-14 DIAGNOSIS — Z1231 Encounter for screening mammogram for malignant neoplasm of breast: Secondary | ICD-10-CM

## 2012-06-15 ENCOUNTER — Other Ambulatory Visit: Payer: Self-pay | Admitting: Family Medicine

## 2012-06-18 ENCOUNTER — Other Ambulatory Visit: Payer: Self-pay | Admitting: Family Medicine

## 2012-06-18 DIAGNOSIS — R928 Other abnormal and inconclusive findings on diagnostic imaging of breast: Secondary | ICD-10-CM

## 2012-06-22 ENCOUNTER — Ambulatory Visit
Admission: RE | Admit: 2012-06-22 | Discharge: 2012-06-22 | Disposition: A | Discharge status: Home / Self Care | Payer: 59 | Source: Ambulatory Visit | Attending: Family Medicine | Admitting: Family Medicine

## 2012-06-22 ENCOUNTER — Encounter: Payer: Self-pay | Admitting: Family Medicine

## 2012-06-22 DIAGNOSIS — R928 Other abnormal and inconclusive findings on diagnostic imaging of breast: Secondary | ICD-10-CM

## 2012-07-05 ENCOUNTER — Other Ambulatory Visit: Payer: Self-pay | Admitting: Family Medicine

## 2012-10-18 ENCOUNTER — Telehealth: Payer: Self-pay | Admitting: *Deleted

## 2012-10-18 MED ORDER — ZOSTER VACCINE LIVE 19400 UNT/0.65ML ~~LOC~~ SOLR: 0.6500 mL | Freq: Once | SUBCUTANEOUS | Status: DC

## 2012-10-18 NOTE — Telephone Encounter (Signed)
Pt calls requesting a rx for the shingles vaccine sent to [facility] [place].  Her ins will cover 100% of the cost there.

## 2012-10-18 NOTE — Telephone Encounter (Signed)
Left message on vm

## 2012-10-18 NOTE — Telephone Encounter (Signed)
Sue Lush, Will you please let mrs. Steelman know that this Rx was sent electronically today.

## 2012-10-19 ENCOUNTER — Other Ambulatory Visit: Payer: Self-pay | Admitting: Family Medicine

## 2012-10-23 ENCOUNTER — Other Ambulatory Visit: Payer: Self-pay | Admitting: Family Medicine

## 2012-10-31 ENCOUNTER — Ambulatory Visit (INDEPENDENT_AMBULATORY_CARE_PROVIDER_SITE_OTHER): Payer: Medicare Other | Admitting: Family Medicine

## 2012-10-31 ENCOUNTER — Encounter: Payer: Self-pay | Admitting: Family Medicine

## 2012-10-31 DIAGNOSIS — Z23 Encounter for immunization: Secondary | ICD-10-CM

## 2012-10-31 NOTE — Progress Notes (Signed)
I was present for all necessary aspect of the today's encounter

## 2012-11-15 ENCOUNTER — Other Ambulatory Visit: Payer: Self-pay

## 2012-12-05 ENCOUNTER — Encounter: Payer: Self-pay | Admitting: Family Medicine

## 2012-12-17 ENCOUNTER — Other Ambulatory Visit: Payer: Self-pay | Admitting: Family Medicine

## 2013-02-19 ENCOUNTER — Other Ambulatory Visit: Payer: Self-pay | Admitting: Family Medicine

## 2013-02-19 NOTE — Telephone Encounter (Signed)
Patient last seen April of 2014

## 2013-04-09 ENCOUNTER — Ambulatory Visit (INDEPENDENT_AMBULATORY_CARE_PROVIDER_SITE_OTHER): Payer: Medicare Other | Admitting: Family Medicine

## 2013-04-09 ENCOUNTER — Encounter: Payer: Self-pay | Admitting: Family Medicine

## 2013-04-09 VITALS — BP 154/102 | HR 75 | Wt 192.0 lb

## 2013-04-09 DIAGNOSIS — Z79899 Other long term (current) drug therapy: Secondary | ICD-10-CM

## 2013-04-09 DIAGNOSIS — R110 Nausea: Secondary | ICD-10-CM

## 2013-04-09 DIAGNOSIS — N3281 Overactive bladder: Secondary | ICD-10-CM

## 2013-04-09 DIAGNOSIS — Z5181 Encounter for therapeutic drug level monitoring: Secondary | ICD-10-CM

## 2013-04-09 DIAGNOSIS — G43109 Migraine with aura, not intractable, without status migrainosus: Secondary | ICD-10-CM

## 2013-04-09 DIAGNOSIS — N318 Other neuromuscular dysfunction of bladder: Secondary | ICD-10-CM

## 2013-04-09 DIAGNOSIS — I10 Essential (primary) hypertension: Secondary | ICD-10-CM

## 2013-04-09 DIAGNOSIS — E785 Hyperlipidemia, unspecified: Secondary | ICD-10-CM

## 2013-04-09 MED ORDER — ATORVASTATIN CALCIUM 40 MG PO TABS: ORAL_TABLET | ORAL | Status: DC

## 2013-04-09 MED ORDER — MIRABEGRON ER 50 MG PO TB24: 50.0000 mg | ORAL_TABLET | Freq: Every day | ORAL | Status: DC

## 2013-04-09 MED ORDER — ONDANSETRON 4 MG PO TBDP: 4.0000 mg | ORAL_TABLET | Freq: Three times a day (TID) | ORAL | Status: DC | PRN

## 2013-04-09 MED ORDER — AMLODIPINE BESYLATE 10 MG PO TABS: ORAL_TABLET | ORAL | Status: DC

## 2013-04-09 MED ORDER — LABETALOL HCL 200 MG PO TABS: ORAL_TABLET | ORAL | Status: DC

## 2013-04-09 NOTE — Progress Notes (Signed)
CC: Donna Sexton is a 63 y.o. female is here for Hypertension and Hyperlipidemia   Subjective: HPI:  Followup hypertension continues on labetalol and amlodipine on a daily basis without missed doses she reports all blood pressure she checks outside of our office or below 140/90, to confirm this I can see with her urology appointment that she is well below 140/90 in their office recently. Denies chest pain shortness of breath orthopnea peripheral edema for motor or sensory disturbances  Followup hyperlipidemia: Continue Lipitor and a daily basis without right quadrant pain nor myalgias  States that her biggest complaint in life right now is urinary frequency and urgency that significantly interferes with quality of life. She saw Dr. Earlene Sexton at [facility] and was prescribed physical therapy, she states that she's come to the conclusion that she realistically will not have her do physical therapy for this. She states the symptoms are present all hours of the day she often awakes at night to urinate and will have to go right back to the bathroom after thought that she emptied her bladder.  She denies dysuria, urinary hesitancy, nor pelvic pain. Denies any genitourinary complaints  She's requesting refills on Zofran for which she uses for migraines. She denies any change in the character severity or frequency of her migraines. They usually disappear after taking Zofran   Review Of Systems Outlined In HPI  Past Medical History  Diagnosis Date  . HYPERLIPIDEMIA 10/29/2007    Qualifier: Diagnosis of  By: Donna Sexton, Donna Sexton.   . BIPOLAR DISORDER UNSPECIFIED 10/29/2007    Qualifier: Diagnosis of  By: Donna Sexton, Donna Sexton.   Marland Kitchen COMMON MIGRAINE 12/29/2008    Qualifier: Diagnosis of  By: Donna Sexton, Donna Sexton.   . HYPERTENSION, BENIGN ESSENTIAL 10/29/2007    Qualifier: Diagnosis of  By: Donna Sexton, Donna Sexton.   Marland Kitchen GERD 07/28/2009    Qualifier: Diagnosis of  By: Donna Coyer Sexton, Donna Sexton OVERACTIVE  BLADDER 02/21/2008    Qualifier: Diagnosis of  By: Donna Ruiz Sexton, Donna Sexton   . OSTEOPENIA 10/30/2007    Qualifier: Diagnosis of  By: Donna Sexton, Donna Sexton.   . ASTHMA NOS W/ACUTE EXACERBATION 01/13/2010    Qualifier: Diagnosis of  By: Donna Sexton, Donna Sexton.   Marland Kitchen SLEEP APNEA 12/31/2009    Qualifier: Diagnosis of  By: Donna Sexton, Donna Sexton.   Marland Kitchen ANEMIA 11/12/2007    Qualifier: Diagnosis of  By: Donna Sexton, Donna Sexton    . Arthritis   . Anxiety     Past Surgical History  Procedure Laterality Date  . Cholecystectomy    . Abdominal hysterectomy    . Tubal ligation    . Carpal tunnel release    . Breast surgery  1980    implants bilateral  . Parotid endoscopy    . Hernia repair     Family History  Problem Relation Age of Onset  . Heart attack Mother   . Hypertension Mother   . Diabetes Mother   . Heart attack Father   . Hypertension Father   . Alcohol abuse Maternal Uncle     History   Social History  . Marital Status: Divorced    Spouse Name: N/A    Number of Children: N/A  . Years of Education: N/A   Occupational History  . Not on file.   Social History Main Topics  . Smoking status: Never Smoker   . Smokeless tobacco: Never Used  . Alcohol Use:  No  . Drug Use: No  . Sexual Activity: Not Currently   Other Topics Concern  . Not on file   Social History Narrative  . No narrative on file     Objective: BP 154/102  Pulse 75  Wt 192 lb (87.091 kg)  General: Alert and Oriented, No Acute Distress HEENT: Pupils equal, round, reactive to light. Conjunctivae clear.  Moist mucous membranes pharynx unremarkable Lungs: Clear to auscultation bilaterally, no wheezing/ronchi/rales.  Comfortable work of breathing. Good air movement. Cardiac: Regular rate and rhythm. Normal S1/S2.  No murmurs, rubs, nor gallops.   Extremities: No peripheral edema.  Strong peripheral pulses.  Mental Status: No depression, anxiety, nor agitation. Skin: Warm and dry.  Assessment & Plan: Donna Sexton was seen today  for hypertension and hyperlipidemia.  Diagnoses and associated orders for this visit:  Hyperlipidemia - Lipid Profile - atorvastatin (LIPITOR) 40 MG tablet; TAKE 1 TABLET (40 MG TOTAL) BY MOUTH DAILY.  Essential hypertension - COMPLETE METABOLIC PANEL WITH GFR - labetalol (NORMODYNE) 200 MG tablet; TWO BY MOUTH IN THE MORNING AND ONE IN THE EVENING. - amLODipine (NORVASC) 10 MG tablet; TAKE 1 TABLET (10 MG TOTAL) BY MOUTH DAILY.  Encounter for monitoring statin therapy - COMPLETE METABOLIC PANEL WITH GFR  Nausea - ondansetron (ZOFRAN ODT) 4 MG disintegrating tablet; Take 1 tablet (4 mg total) by mouth every 8 (eight) hours as needed for nausea.  Migraine headache with aura  Overactive bladder - mirabegron ER (MYRBETRIQ) 50 MG TB24 tablet; Take 1 tablet (50 mg total) by mouth daily.    Hyperlipidemia: Due for annual lipid panel, continue Lipitor pending hepatic function Essential hypertension: Uncontrolled in our office, I've asked her to keep a blood pressure diary of her values at home and provide them to me in the next week or 2. Interestingly blood pressures normal at other offices, continue amlodipine and labetalol for now Nausea: Continue Zofran as needed Overactive bladder: Uncontrolled chronic condition start myrbetriq, and one month of samples call if improvement after 3 weeks formal prescription    Return in about 3 months (around 07/09/2013).

## 2013-04-10 ENCOUNTER — Ambulatory Visit: Payer: Self-pay

## 2013-04-15 ENCOUNTER — Encounter: Payer: Self-pay | Admitting: *Deleted

## 2013-04-15 ENCOUNTER — Ambulatory Visit (INDEPENDENT_AMBULATORY_CARE_PROVIDER_SITE_OTHER): Payer: Medicare Other | Admitting: Family Medicine

## 2013-04-15 VITALS — BP 100/68 | HR 75

## 2013-04-15 DIAGNOSIS — Z23 Encounter for immunization: Secondary | ICD-10-CM

## 2013-04-15 NOTE — Progress Notes (Signed)
I was present for all necessary aspects of today's procedure.

## 2013-04-17 LAB — LIPID PANEL
CHOLESTEROL: 168 mg/dL (ref 0–200)
HDL: 55 mg/dL (ref >39)
LDL Cholesterol: 93 mg/dL (ref 0–99)
TRIGLYCERIDES: 100 mg/dL (ref <150)
Total CHOL/HDL Ratio: 3.1 Ratio
VLDL: 20 mg/dL (ref 0–40)

## 2013-04-17 LAB — COMPLETE METABOLIC PANEL WITH GFR
ALBUMIN: 3.8 g/dL (ref 3.5–5.2)
ALT: 13 U/L (ref 0–35)
AST: 13 U/L (ref 0–37)
Alkaline Phosphatase: 61 U/L (ref 39–117)
BUN: 24 mg/dL — AB (ref 6–23)
CALCIUM: 8.9 mg/dL (ref 8.4–10.5)
CHLORIDE: 99 meq/L (ref 96–112)
CO2: 27 mEq/L (ref 19–32)
Creat: 0.79 mg/dL (ref 0.50–1.10)
GFR, EST NON AFRICAN AMERICAN: 79 mL/min
GLUCOSE: 83 mg/dL (ref 70–99)
POTASSIUM: 4.8 meq/L (ref 3.5–5.3)
Sodium: 135 mEq/L (ref 135–145)
Total Bilirubin: 0.7 mg/dL (ref 0.2–1.2)
Total Protein: 6 g/dL (ref 6.0–8.3)

## 2013-04-22 ENCOUNTER — Ambulatory Visit: Payer: Self-pay | Admitting: Family Medicine

## 2013-04-30 ENCOUNTER — Telehealth: Payer: Self-pay | Admitting: *Deleted

## 2013-04-30 DIAGNOSIS — N3281 Overactive bladder: Secondary | ICD-10-CM

## 2013-04-30 MED ORDER — MIRABEGRON ER 50 MG PO TB24: 50.0000 mg | ORAL_TABLET | Freq: Every day | ORAL | Status: DC

## 2013-04-30 NOTE — Telephone Encounter (Signed)
Pt called and states the Myrbetriq samples worked well and [facility] [place] [facility] [place]

## 2013-05-10 ENCOUNTER — Telehealth: Payer: Self-pay | Admitting: *Deleted

## 2013-05-10 NOTE — Telephone Encounter (Signed)
Pt called and left a message that she is very constipated and she did relieve some of it herself but now she has hemorrhoids. Pt states she is in a lot of pain now.  Advised pt that she cant try miralax 1- 2 heaping spoonfulls through the weekend and if no relief f/u with hommel early next week.pt voiced understanding

## 2013-05-15 ENCOUNTER — Telehealth: Payer: Self-pay | Admitting: *Deleted

## 2013-05-15 NOTE — Telephone Encounter (Signed)
Zofran approved until 04-10-2014.   Meyer CoryMisty Huey Scalia, LPN

## 2013-05-23 ENCOUNTER — Telehealth: Payer: Self-pay | Admitting: *Deleted

## 2013-05-23 NOTE — Telephone Encounter (Signed)
Pt called and wanted to know if Dr. Ivan AnchorsHommel could send a rx of Gabapentin to her pharm. I didn't see where this had been discussed at any recent office visits and I didn't see that this has ever been rx'ed for her. Called and advised pt to schedule an appt

## 2013-10-22 ENCOUNTER — Other Ambulatory Visit: Payer: Self-pay | Admitting: *Deleted

## 2013-10-22 DIAGNOSIS — N3281 Overactive bladder: Secondary | ICD-10-CM

## 2013-10-22 MED ORDER — MIRABEGRON ER 50 MG PO TB24: 50.0000 mg | ORAL_TABLET | Freq: Every day | ORAL | Status: DC

## 2013-11-18 ENCOUNTER — Ambulatory Visit (INDEPENDENT_AMBULATORY_CARE_PROVIDER_SITE_OTHER): Payer: Medicare Other | Admitting: Family Medicine

## 2013-11-18 ENCOUNTER — Ambulatory Visit (INDEPENDENT_AMBULATORY_CARE_PROVIDER_SITE_OTHER): Payer: Medicare Other | Admitting: *Deleted

## 2013-11-18 ENCOUNTER — Encounter: Payer: Self-pay | Admitting: Family Medicine

## 2013-11-18 VITALS — BP 124/78 | HR 76 | Wt 202.0 lb

## 2013-11-18 DIAGNOSIS — M1711 Unilateral primary osteoarthritis, right knee: Secondary | ICD-10-CM

## 2013-11-18 DIAGNOSIS — Z23 Encounter for immunization: Secondary | ICD-10-CM

## 2013-11-18 MED ORDER — MELOXICAM 15 MG PO TABS: 15.0000 mg | ORAL_TABLET | Freq: Every day | ORAL | Status: DC

## 2013-11-18 NOTE — Progress Notes (Signed)
CC: Donna Sexton is a 63 y.o. female is here for Knee Pain   Subjective: HPI:  Complains of right knee pain localized in the medial and lateral aspects of the knee. It is worse at the end of the day if she's been up walking around. It is worsened with climbing up or down from a chair or with walking for long periods of time. It is also painful when getting up or down from the floor. She denies any falls or close calls. There's been no locking catching or giving way. She believes there is been some swelling with painful. It has night. It's been present on a daily basis for over 3 years now but seems to be worsening over the past few weeks. It is moderate in severity. No interventions recently. I x-rayed her knee about 3 years ago which showed mild medial compartment and patellofemoral osteoarthritis.  She denies swelling of the appendages, weakness, nor motor or sensory disturbances. Denies fevers or chills   Review Of Systems Outlined In HPI  Past Medical History  Diagnosis Date  . HYPERLIPIDEMIA 10/29/2007    Qualifier: Diagnosis of  By: Yetta BarreJones MD, Bernadene Bellhomas L.   . BIPOLAR DISORDER UNSPECIFIED 10/29/2007    Qualifier: Diagnosis of  By: Yetta BarreJones MD, Bernadene Bellhomas L.   Marland Kitchen. COMMON MIGRAINE 12/29/2008    Qualifier: Diagnosis of  By: Yetta BarreJones MD, Bernadene Bellhomas L.   . HYPERTENSION, BENIGN ESSENTIAL 10/29/2007    Qualifier: Diagnosis of  By: Yetta BarreJones MD, Bernadene Bellhomas L.   Marland Kitchen. GERD 07/28/2009    Qualifier: Diagnosis of  By: Felicity CoyerLeschber MD, Raenette RoverValerie A   . OVERACTIVE BLADDER 02/21/2008    Qualifier: Diagnosis of  By: Jonny RuizJohn MD, Len BlalockJames W   . OSTEOPENIA 10/30/2007    Qualifier: Diagnosis of  By: Yetta BarreJones MD, Bernadene Bellhomas L.   . ASTHMA NOS W/ACUTE EXACERBATION 01/13/2010    Qualifier: Diagnosis of  By: Yetta BarreJones MD, Bernadene Bellhomas L.   Marland Kitchen. SLEEP APNEA 12/31/2009    Qualifier: Diagnosis of  By: Yetta BarreJones MD, Bernadene Bellhomas L.   Marland Kitchen. ANEMIA 11/12/2007    Qualifier: Diagnosis of  By: Briscoe BurnsArchie CMA, Alvy BealLakisha    . Arthritis   . Anxiety     Past Surgical History  Procedure Laterality  Date  . Cholecystectomy    . Abdominal hysterectomy    . Tubal ligation    . Carpal tunnel release    . Breast surgery  1980    implants bilateral  . Parotid endoscopy    . Hernia repair     Family History  Problem Relation Age of Onset  . Heart attack Mother   . Hypertension Mother   . Diabetes Mother   . Heart attack Father   . Hypertension Father   . Alcohol abuse Maternal Uncle     History   Social History  . Marital Status: Divorced    Spouse Name: N/A    Number of Children: N/A  . Years of Education: N/A   Occupational History  . Not on file.   Social History Main Topics  . Smoking status: Never Smoker   . Smokeless tobacco: Never Used  . Alcohol Use: No  . Drug Use: No  . Sexual Activity: Not Currently   Other Topics Concern  . Not on file   Social History Narrative     Objective: BP 124/78 mmHg  Pulse 76  Wt 202 lb (91.627 kg)  General: Alert and Oriented, No Acute Distress HEENT: Pupils equal, round, reactive to light. Conjunctivae clear.  Moist mucous membranes pharynx unremarkable Lungs: clinical border breathing Cardiac: Regular rate and rhythm. Right knee exam shows full-strength and range of motion. There is no swelling, redness, nor warmth overlying the knee.  No patellar crepitus. No patellar apprehension. No pain with palpation of the inferior patellar pole.  No pain or laxity with valgus nor varus stress. Anterior drawer is negative. McMurray's negative. No popliteal space tenderness or palpable mass. No medial or lateral joint line tenderness to palpation. Extremities: No peripheral edema.  Strong peripheral pulses.  Mental Status: No depression, anxiety, nor agitation. Skin: Warm and dry.  Assessment & Plan: Donna Sexton was seen today for knee pain.  DiaRosey Bathgnoses and associated orders for this visit:  Primary osteoarthritis of right knee - meloxicam (MOBIC) 15 MG tablet; Take 1 tablet (15 mg total) by mouth daily. To prevent knee  pain.    Primary osteoarthritis: Start meloxicam, she declines physical therapy today. If no better in one week she can return for a intra-articular triamcinolone injection with me or can return to see Dr. Karie Schwalbe for further evaluation   Return for Knee injections with Dr. Karie Schwalbe if not better by next week..Marland Kitchen

## 2013-12-02 ENCOUNTER — Other Ambulatory Visit: Payer: Self-pay | Admitting: Family Medicine

## 2013-12-03 ENCOUNTER — Encounter: Payer: Self-pay | Admitting: Family Medicine

## 2013-12-03 ENCOUNTER — Ambulatory Visit (INDEPENDENT_AMBULATORY_CARE_PROVIDER_SITE_OTHER): Payer: Medicare Other | Admitting: Family Medicine

## 2013-12-03 ENCOUNTER — Other Ambulatory Visit: Payer: Self-pay | Admitting: *Deleted

## 2013-12-03 VITALS — BP 130/81 | HR 70 | Wt 199.0 lb

## 2013-12-03 DIAGNOSIS — Z638 Other specified problems related to primary support group: Secondary | ICD-10-CM

## 2013-12-03 DIAGNOSIS — R112 Nausea with vomiting, unspecified: Secondary | ICD-10-CM

## 2013-12-03 DIAGNOSIS — M1711 Unilateral primary osteoarthritis, right knee: Secondary | ICD-10-CM

## 2013-12-03 DIAGNOSIS — Z639 Problem related to primary support group, unspecified: Secondary | ICD-10-CM

## 2013-12-03 MED ORDER — MELOXICAM 15 MG PO TABS: 15.0000 mg | ORAL_TABLET | Freq: Every day | ORAL | Status: DC

## 2013-12-03 MED ORDER — PROMETHAZINE HCL 25 MG PO TABS: 25.0000 mg | ORAL_TABLET | Freq: Three times a day (TID) | ORAL | Status: DC | PRN

## 2013-12-03 MED ORDER — CYCLOBENZAPRINE HCL 5 MG PO TABS: 5.0000 mg | ORAL_TABLET | Freq: Every day | ORAL | Status: DC

## 2013-12-03 NOTE — Progress Notes (Signed)
CC: Donna Sexton is a 63 y.o. female is here for Follow-up overactive bladder   Subjective: HPI:  Primary reason for today's visit is in regards to the patient's dissatisfaction and frustration with her daughter's behavior and irresponsibility with raising the patient's granddaughter. She describes a situation where her daughter was involved in an accident over a decade ago and since then has been using that injury as a means for disability wherein she stays at home all day drinking alcohol all day usually as early as midmorning. The patient is observing the daughter getting verbal altercations with her son-in-law. These 2 parents are not particularly financially or emotionally supportive of the patient's granddaughter and lately the patient has been becoming more of a necessity with stepping in for the granddaughters academic pursuits, overall health, and emotional support. Patient describes frequently breaking down emotionally and feeling overwhelmed about the situation and not knowing what to do. In addition to crying episodes she reports that when the stress gets to her she gets nauseous and that Zofran has been of no help however Phenergan helps greatly. She denies any change in her diet, vomiting, abdominal pain. She denies depression or paranoia.  Since started on meloxicam she states that her right knee pain is greatly improved. She is hoping to not need any steroid injections in the near future and believes meloxicam is doing a good enough job, pain is not currently affecting her quality of life. She denies any new mechanical symptoms with the knee.   Review Of Systems Outlined In HPI  Past Medical History  Diagnosis Date  . HYPERLIPIDEMIA 10/29/2007    Qualifier: Diagnosis of  By: Yetta BarreJones MD, Bernadene Bellhomas L.   . BIPOLAR DISORDER UNSPECIFIED 10/29/2007    Qualifier: Diagnosis of  By: Yetta BarreJones MD, Bernadene Bellhomas L.   Marland Kitchen. COMMON MIGRAINE 12/29/2008    Qualifier: Diagnosis of  By: Yetta BarreJones MD, Bernadene Bellhomas L.   .  HYPERTENSION, BENIGN ESSENTIAL 10/29/2007    Qualifier: Diagnosis of  By: Yetta BarreJones MD, Bernadene Bellhomas L.   Marland Kitchen. GERD 07/28/2009    Qualifier: Diagnosis of  By: Felicity CoyerLeschber MD, Raenette RoverValerie A   . OVERACTIVE BLADDER 02/21/2008    Qualifier: Diagnosis of  By: Jonny RuizJohn MD, Len BlalockJames W   . OSTEOPENIA 10/30/2007    Qualifier: Diagnosis of  By: Yetta BarreJones MD, Bernadene Bellhomas L.   . ASTHMA NOS W/ACUTE EXACERBATION 01/13/2010    Qualifier: Diagnosis of  By: Yetta BarreJones MD, Bernadene Bellhomas L.   Marland Kitchen. SLEEP APNEA 12/31/2009    Qualifier: Diagnosis of  By: Yetta BarreJones MD, Bernadene Bellhomas L.   Marland Kitchen. ANEMIA 11/12/2007    Qualifier: Diagnosis of  By: Briscoe BurnsArchie CMA, Alvy BealLakisha    . Arthritis   . Anxiety     Past Surgical History  Procedure Laterality Date  . Cholecystectomy    . Abdominal hysterectomy    . Tubal ligation    . Carpal tunnel release    . Breast surgery  1980    implants bilateral  . Parotid endoscopy    . Hernia repair     Family History  Problem Relation Age of Onset  . Heart attack Mother   . Hypertension Mother   . Diabetes Mother   . Heart attack Father   . Hypertension Father   . Alcohol abuse Maternal Uncle     History   Social History  . Marital Status: Divorced    Spouse Name: N/A    Number of Children: N/A  . Years of Education: N/A   Occupational History  .  Not on file.   Social History Main Topics  . Smoking status: Never Smoker   . Smokeless tobacco: Never Used  . Alcohol Use: No  . Drug Use: No  . Sexual Activity: Not Currently   Other Topics Concern  . Not on file   Social History Narrative     Objective: BP 130/81 mmHg  Pulse 70  Wt 199 lb (90.266 kg)  Vital signs reviewed. General: Alert and Oriented, No Acute Distress HEENT: Pupils equal, round, reactive to light. Conjunctivae clear.  External ears unremarkable.  Moist mucous membranes. Lungs: Clear and comfortable work of breathing, speaking in full sentences without accessory muscle use. Cardiac: Regular rate and rhythm.  Neuro: CN II-XII grossly intact, gait  normal. Extremities: No peripheral edema.  Strong peripheral pulses.  Mental Status: tearful during the majority of our encounter, mild depression. No anxiety or agitation. Logical thought process Skin: Warm and dry.  Assessment & Plan: Donna Sexton was seen today for follow-up overactive bladder.  Diagnoses and associated orders for this visit:  Family conflict  Non-intractable vomiting with nausea, vomiting of unspecified type - promethazine (PHENERGAN) 25 MG tablet; Take 1 tablet (25 mg total) by mouth every 8 (eight) hours as needed for nausea or vomiting.  Primary osteoarthritis of right knee - meloxicam (MOBIC) 15 MG tablet; Take 1 tablet (15 mg total) by mouth daily. To prevent knee pain.    Family conflict: Over 40 minutes was spent with this patient discussing her current family situation and strategies to intervene on her granddaughter's well-being. Patient is not interested in pharmaceutical support to help with her emotions which is very understandable however would like a refill on Phenergan.. She is not also interested in any formal counseling. Right knee osteoarthritis: Improved with meloxicam therefore continue  40 minutes spent face-to-face during visit today of which at least 50% was counseling or coordinating care regarding: 1. Family conflict   2. Non-intractable vomiting with nausea, vomiting of unspecified type   3. Primary osteoarthritis of right knee      Return if symptoms worsen or fail to improve.

## 2013-12-04 ENCOUNTER — Telehealth: Payer: Self-pay | Admitting: *Deleted

## 2013-12-04 NOTE — Telephone Encounter (Signed)
Pt called and wanted to know if she could restart her gabapentin.

## 2013-12-05 ENCOUNTER — Encounter: Payer: Self-pay | Admitting: Family Medicine

## 2013-12-08 NOTE — Telephone Encounter (Signed)
Yes this is fine, can you confirm with her that her prior dose was 300 mg taken at bedtime only. If this is correct can you please call in a 90 day supply to her pharmacy of choice. Diagnosis would be bilateral knee pain.

## 2013-12-09 ENCOUNTER — Encounter: Payer: Self-pay | Admitting: Family Medicine

## 2013-12-09 NOTE — Telephone Encounter (Signed)
Called and left message asking to call back to clarify

## 2013-12-10 MED ORDER — GABAPENTIN 300 MG PO CAPS: 300.0000 mg | ORAL_CAPSULE | Freq: Every day | ORAL | Status: DC

## 2013-12-10 NOTE — Telephone Encounter (Signed)
Pt didn't know what dose or how much she is taking;[facility]

## 2013-12-11 MED ORDER — GABAPENTIN 300 MG PO CAPS: 300.0000 mg | ORAL_CAPSULE | Freq: Every day | ORAL | Status: DC

## 2013-12-27 ENCOUNTER — Other Ambulatory Visit: Payer: Self-pay | Admitting: Family Medicine

## 2014-02-08 ENCOUNTER — Encounter: Payer: Self-pay | Admitting: Family Medicine

## 2014-02-08 DIAGNOSIS — M1711 Unilateral primary osteoarthritis, right knee: Secondary | ICD-10-CM

## 2014-02-19 ENCOUNTER — Telehealth: Payer: Self-pay

## 2014-02-19 NOTE — Telephone Encounter (Signed)
Filled out PA for cyclobenzaprine 5mg  and placed in Dr. Genelle BalHommel's box for a signature. Will fax as soon as signed - CF

## 2014-02-23 NOTE — Telephone Encounter (Addendum)
[facility] Ivan [facility]ch[facility]Hommel[facility] mg # 90 for $13.42 and meloxicam 15 mg # 30 for $ 4.00. I'm not sure if this information will help.

## 2014-02-24 MED ORDER — GABAPENTIN 300 MG PO CAPS: 300.0000 mg | ORAL_CAPSULE | Freq: Every day | ORAL | Status: DC

## 2014-02-24 MED ORDER — MELOXICAM 15 MG PO TABS: 15.0000 mg | ORAL_TABLET | Freq: Every day | ORAL | Status: DC

## 2014-02-27 ENCOUNTER — Encounter: Payer: Self-pay | Admitting: Family Medicine

## 2014-02-27 DIAGNOSIS — M62838 Other muscle spasm: Secondary | ICD-10-CM | POA: Insufficient documentation

## 2014-03-12 ENCOUNTER — Telehealth: Payer: Self-pay

## 2014-03-12 NOTE — Telephone Encounter (Signed)
Spoke with Donna Sexton at [employer] and Cyclobenzaprine was approved 02/20/2014 to 02/21/2015 - CF

## 2014-03-14 ENCOUNTER — Other Ambulatory Visit: Payer: Self-pay | Admitting: Family Medicine

## 2014-03-19 ENCOUNTER — Other Ambulatory Visit: Payer: Self-pay | Admitting: Family Medicine

## 2014-03-26 ENCOUNTER — Other Ambulatory Visit: Payer: Self-pay | Admitting: Family Medicine

## 2014-03-27 NOTE — Telephone Encounter (Signed)
Patient needs to see Provider for appointment. 

## 2014-04-09 ENCOUNTER — Other Ambulatory Visit: Payer: Self-pay | Admitting: Family Medicine

## 2014-04-11 ENCOUNTER — Other Ambulatory Visit: Payer: Self-pay | Admitting: Family Medicine

## 2014-04-16 ENCOUNTER — Ambulatory Visit (INDEPENDENT_AMBULATORY_CARE_PROVIDER_SITE_OTHER): Payer: Medicare Other | Admitting: Family Medicine

## 2014-04-16 ENCOUNTER — Encounter: Payer: Self-pay | Admitting: Family Medicine

## 2014-04-16 VITALS — BP 123/80 | HR 75 | Wt 220.0 lb

## 2014-04-16 DIAGNOSIS — E785 Hyperlipidemia, unspecified: Secondary | ICD-10-CM

## 2014-04-16 DIAGNOSIS — I10 Essential (primary) hypertension: Secondary | ICD-10-CM

## 2014-04-16 DIAGNOSIS — N3281 Overactive bladder: Secondary | ICD-10-CM | POA: Diagnosis not present

## 2014-04-16 DIAGNOSIS — G2581 Restless legs syndrome: Secondary | ICD-10-CM | POA: Diagnosis not present

## 2014-04-16 MED ORDER — ATORVASTATIN CALCIUM 40 MG PO TABS: ORAL_TABLET | ORAL | Status: DC

## 2014-04-16 MED ORDER — GABAPENTIN 300 MG PO CAPS: 300.0000 mg | ORAL_CAPSULE | Freq: Four times a day (QID) | ORAL | Status: DC

## 2014-04-16 MED ORDER — LABETALOL HCL 200 MG PO TABS: ORAL_TABLET | ORAL | Status: DC

## 2014-04-16 MED ORDER — TOLTERODINE TARTRATE ER 2 MG PO CP24: 2.0000 mg | ORAL_CAPSULE | Freq: Every day | ORAL | Status: DC

## 2014-04-16 MED ORDER — AMLODIPINE BESYLATE 10 MG PO TABS: ORAL_TABLET | ORAL | Status: DC

## 2014-04-16 NOTE — Progress Notes (Signed)
CC: Donna Sexton is a 64 y.o. female is here for medication managment   Subjective: HPI:  Follow-up overactive bladder: She tells me that myrbetric is going to cost her over $100 a month on her current insurance plan. She tells me helps with overactive bladder but not for the cost. She wants her something she can try. She's never been on anything to help reduce frequent urination and overactive bladder before. Denies dysuria, urinary hesitancy, no pelvic pain.  Follow-up hyperlipidemia: Continues to take atorvastatin on a daily basis. Denies any right upper quadrant pain or myalgias.  Follow-up hypertension: Continues to take labetalol on a daily basis along with amlodipine. No outside blood pressures report. Denies chest pain shortness of breath orthopnea nor peripheral edema  She tells me that ever since she restarted taking gabapentin she's noticed that she is no longer having the sensation of constantly having to move her legs when resting. She self titrated gabapentin to a dose of 300 mg 3-4 times a day and has noticed complete resolution of these symptoms affecting her legs. She denies any known side effects from taking his current regimen. Specifically denies any sedation or any other motor or sensory disturbances. She thinks she's had these symptoms for many years and had thought that she just needed to ignore them.   Review Of Systems Outlined In HPI  Past Medical History  Diagnosis Date  . HYPERLIPIDEMIA 10/29/2007    Qualifier: Diagnosis of  By: Yetta BarreJones MD, Bernadene Bellhomas L.   . BIPOLAR DISORDER UNSPECIFIED 10/29/2007    Qualifier: Diagnosis of  By: Yetta BarreJones MD, Bernadene Bellhomas L.   Marland Kitchen. COMMON MIGRAINE 12/29/2008    Qualifier: Diagnosis of  By: Yetta BarreJones MD, Bernadene Bellhomas L.   . HYPERTENSION, BENIGN ESSENTIAL 10/29/2007    Qualifier: Diagnosis of  By: Yetta BarreJones MD, Bernadene Bellhomas L.   Marland Kitchen. GERD 07/28/2009    Qualifier: Diagnosis of  By: Felicity CoyerLeschber MD, Raenette RoverValerie A   . OVERACTIVE BLADDER 02/21/2008    Qualifier: Diagnosis of  By:  Jonny RuizJohn MD, Len BlalockJames W   . OSTEOPENIA 10/30/2007    Qualifier: Diagnosis of  By: Yetta BarreJones MD, Bernadene Bellhomas L.   . ASTHMA NOS W/ACUTE EXACERBATION 01/13/2010    Qualifier: Diagnosis of  By: Yetta BarreJones MD, Bernadene Bellhomas L.   Marland Kitchen. SLEEP APNEA 12/31/2009    Qualifier: Diagnosis of  By: Yetta BarreJones MD, Bernadene Bellhomas L.   Marland Kitchen. ANEMIA 11/12/2007    Qualifier: Diagnosis of  By: Briscoe BurnsArchie CMA, Alvy BealLakisha    . Arthritis   . Anxiety     Past Surgical History  Procedure Laterality Date  . Cholecystectomy    . Abdominal hysterectomy    . Tubal ligation    . Carpal tunnel release    . Breast surgery  1980    implants bilateral  . Parotid endoscopy    . Hernia repair     Family History  Problem Relation Age of Onset  . Heart attack Mother   . Hypertension Mother   . Diabetes Mother   . Heart attack Father   . Hypertension Father   . Alcohol abuse Maternal Uncle     History   Social History  . Marital Status: Divorced    Spouse Name: N/A  . Number of Children: N/A  . Years of Education: N/A   Occupational History  . Not on file.   Social History Main Topics  . Smoking status: Never Smoker   . Smokeless tobacco: Never Used  . Alcohol Use: No  . Drug Use: No  .  Sexual Activity: Not Currently   Other Topics Concern  . Not on file   Social History Narrative     Objective: BP 123/80 mmHg  Pulse 75  Wt 220 lb (99.791 kg)  General: Alert and Oriented, No Acute Distress HEENT: Pupils equal, round, reactive to light. Conjunctivae clear.  Moist mucous membranes Lungs: Clear to auscultation bilaterally, no wheezing/ronchi/rales.  Comfortable work of breathing. Good air movement. Cardiac: Regular rate and rhythm. Normal S1/S2.  No murmurs, rubs, nor gallops.  No carotid bruit Extremities: No peripheral edema.  Strong peripheral pulses. Full range of motion strength of both lower extremities Mental Status: No depression, anxiety, nor agitation. Skin: Warm and dry.  Assessment & Plan: Donna Sexton was seen today for medication  managment.  Diagnoses and all orders for this visit:  Overactive bladder Orders: -     tolterodine (DETROL LA) 2 MG 24 hr capsule; Take 1 capsule (2 mg total) by mouth daily.  Essential hypertension Orders: -     COMPLETE METABOLIC PANEL WITH GFR  Hyperlipidemia Orders: -     Lipid panel -     COMPLETE METABOLIC PANEL WITH GFR  Restless leg  Other orders -     gabapentin (NEURONTIN) 300 MG capsule; Take 1-2 capsules (300-600 mg total) by mouth 4 (four) times daily. -     labetalol (NORMODYNE) 200 MG tablet; TAKE 2 TABLETS BY MOUTH EVERY MORNING AND 1 TABLET IN THE EVENING -     amLODipine (NORVASC) 10 MG tablet; TAKE 1 TABLET (10 MG TOTAL) BY MOUTH DAILY. -     atorvastatin (LIPITOR) 40 MG tablet; TAKE 1 TABLET (40 MG TOTAL) BY MOUTH DAILY.   Overactive bladder: We'll try alternatives to [facility] in hopes of finding a cheaper alternative. First attempt with Detrol. Essential hypertension: Controlled continue amlodipine and labetalol Hyperlipidemia: Due for lipid panel continue atorvastatin pending liver enzymes and lipid panel Restless leg syndrome: Controlled with gabapentin, advised to notify me before thinking about any self titrations to get my advice first.  Return in about 3 months (around 07/16/2014).

## 2014-04-17 ENCOUNTER — Telehealth: Payer: Self-pay | Admitting: Family Medicine

## 2014-04-17 NOTE — Telephone Encounter (Signed)
Received fax for pa on Promethazine hcl sent through cover my meds waiting on auth. - CF

## 2014-04-24 ENCOUNTER — Encounter: Payer: Self-pay | Admitting: Family Medicine

## 2014-04-24 DIAGNOSIS — R110 Nausea: Secondary | ICD-10-CM | POA: Insufficient documentation

## 2014-04-29 NOTE — Telephone Encounter (Signed)
Medication was denied. - CF 

## 2014-04-30 NOTE — Telephone Encounter (Signed)
Donna Sexton, Will you please let patient know that the PA for promethazine [facility] has been denied by her insurance plan. As of today [facility] states that you can get 36 tablets of this medication for $10 if you paid out of pocket. Please let me know if she would like a paper copy for this Rx for [facility].

## 2014-04-30 NOTE — Telephone Encounter (Signed)
Left message on patient vm advising her of this information. Zae Kirtz,CMA

## 2014-05-05 ENCOUNTER — Other Ambulatory Visit: Payer: Self-pay | Admitting: *Deleted

## 2014-05-05 MED ORDER — PROMETHAZINE HCL 25 MG PO TABS: ORAL_TABLET | ORAL | Status: DC

## 2014-05-06 LAB — COMPLETE METABOLIC PANEL WITH GFR
ALBUMIN: 4.2 g/dL (ref 3.5–5.2)
ALT: 14 U/L (ref 0–35)
AST: 30 U/L (ref 0–37)
Alkaline Phosphatase: 57 U/L (ref 39–117)
BILIRUBIN TOTAL: 0.9 mg/dL (ref 0.2–1.2)
BUN: 10 mg/dL (ref 6–23)
CALCIUM: 9.7 mg/dL (ref 8.4–10.5)
CHLORIDE: 98 meq/L (ref 96–112)
CO2: 23 mEq/L (ref 19–32)
Creat: 0.52 mg/dL (ref 0.50–1.10)
GFR, Est Non African American: >89 mL/min
Glucose, Bld: 99 mg/dL (ref 70–99)
POTASSIUM: 5.4 meq/L — AB (ref 3.5–5.3)
Sodium: 133 mEq/L — ABNORMAL LOW (ref 135–145)
Total Protein: 6.9 g/dL (ref 6.0–8.3)

## 2014-05-06 LAB — LIPID PANEL
CHOL/HDL RATIO: 2.5 ratio
Cholesterol: 164 mg/dL (ref 0–200)
HDL: 65 mg/dL (ref >=46)
LDL Cholesterol: 80 mg/dL (ref 0–99)
Triglycerides: 96 mg/dL (ref <150)
VLDL: 19 mg/dL (ref 0–40)

## 2014-05-07 ENCOUNTER — Telehealth: Payer: Self-pay | Admitting: Family Medicine

## 2014-05-07 DIAGNOSIS — E875 Hyperkalemia: Secondary | ICD-10-CM

## 2014-05-07 NOTE — Telephone Encounter (Signed)
Pt.notified

## 2014-05-07 NOTE — Telephone Encounter (Signed)
Donna Sexton, Will you please let patient know that her cholesterol is at goal. Her kidney function was normal other than a mild elevation in potassium.  I'd recommend she have this repeated in one week to see if this value was a random outlier or if it represents a new deviation from the norm.  Lab slip in your inbox, no need to be fasting.

## 2014-05-09 LAB — POTASSIUM: Potassium: 4.9 mEq/L (ref 3.5–5.3)

## 2014-06-23 ENCOUNTER — Other Ambulatory Visit: Payer: Self-pay | Admitting: Family Medicine

## 2014-07-07 ENCOUNTER — Other Ambulatory Visit: Payer: Self-pay

## 2014-07-21 ENCOUNTER — Other Ambulatory Visit: Payer: Self-pay | Admitting: Family Medicine

## 2014-07-24 ENCOUNTER — Other Ambulatory Visit: Payer: Self-pay | Admitting: Family Medicine

## 2014-07-28 ENCOUNTER — Ambulatory Visit (INDEPENDENT_AMBULATORY_CARE_PROVIDER_SITE_OTHER): Payer: Medicare Other | Admitting: Family Medicine

## 2014-07-28 ENCOUNTER — Encounter: Payer: Self-pay | Admitting: Family Medicine

## 2014-07-28 VITALS — BP 108/72 | HR 77 | Wt 218.0 lb

## 2014-07-28 DIAGNOSIS — N3281 Overactive bladder: Secondary | ICD-10-CM

## 2014-07-28 DIAGNOSIS — G2581 Restless legs syndrome: Secondary | ICD-10-CM

## 2014-07-28 DIAGNOSIS — Z23 Encounter for immunization: Secondary | ICD-10-CM | POA: Diagnosis not present

## 2014-07-28 DIAGNOSIS — Z13820 Encounter for screening for osteoporosis: Secondary | ICD-10-CM | POA: Diagnosis not present

## 2014-07-28 MED ORDER — OXYBUTYNIN CHLORIDE 5 MG PO TABS: 2.5000 mg | ORAL_TABLET | Freq: Two times a day (BID) | ORAL | Status: DC

## 2014-07-28 MED ORDER — GABAPENTIN 300 MG PO CAPS: ORAL_CAPSULE | ORAL | Status: DC

## 2014-07-28 NOTE — Progress Notes (Signed)
CC: Donna Sexton is a 64 y.o. female is here for Medication Refill   Subjective: HPI:  Follow-up overactive bladder: She was unable to purchase Detrol because it was over $100. She would like to know if there something in a lower tier that she can use. She brings in her formulary from [employer] today. She still reports urinary urgency and urge incontinence to a mild-to-moderate degree. Symptoms are present on a daily basis of a particularly makes them better or worse. She denies dysuria or any other genitourinary complaints. No flank pain or back pain.  She is requesting a refill of gabapentin on a paper copy that she can take with her while traveling this summer. She states is still doing a great job with reducing and almost completely eliminating her restless leg syndrome. She states that symptoms are not interfering with quality of life. Denies any known side effects   Review Of Systems Outlined In HPI  Past Medical History  Diagnosis Date  . HYPERLIPIDEMIA 10/29/2007    Qualifier: Diagnosis of  By: Yetta BarreJones MD, Bernadene Bellhomas L.   . BIPOLAR DISORDER UNSPECIFIED 10/29/2007    Qualifier: Diagnosis of  By: Yetta BarreJones MD, Bernadene Bellhomas L.   Marland Kitchen. COMMON MIGRAINE 12/29/2008    Qualifier: Diagnosis of  By: Yetta BarreJones MD, Bernadene Bellhomas L.   . HYPERTENSION, BENIGN ESSENTIAL 10/29/2007    Qualifier: Diagnosis of  By: Yetta BarreJones MD, Bernadene Bellhomas L.   Marland Kitchen. GERD 07/28/2009    Qualifier: Diagnosis of  By: Felicity CoyerLeschber MD, Raenette RoverValerie A   . OVERACTIVE BLADDER 02/21/2008    Qualifier: Diagnosis of  By: Jonny RuizJohn MD, Len BlalockJames W   . OSTEOPENIA 10/30/2007    Qualifier: Diagnosis of  By: Yetta BarreJones MD, Bernadene Bellhomas L.   . ASTHMA NOS W/ACUTE EXACERBATION 01/13/2010    Qualifier: Diagnosis of  By: Yetta BarreJones MD, Bernadene Bellhomas L.   Marland Kitchen. SLEEP APNEA 12/31/2009    Qualifier: Diagnosis of  By: Yetta BarreJones MD, Bernadene Bellhomas L.   Marland Kitchen. ANEMIA 11/12/2007    Qualifier: Diagnosis of  By: Briscoe BurnsArchie CMA, Alvy BealLakisha    . Arthritis   . Anxiety     Past Surgical History  Procedure Laterality Date  .  Cholecystectomy    . Abdominal hysterectomy    . Tubal ligation    . Carpal tunnel release    . Breast surgery  1980    implants bilateral  . Parotid endoscopy    . Hernia repair     Family History  Problem Relation Age of Onset  . Heart attack Mother   . Hypertension Mother   . Diabetes Mother   . Heart attack Father   . Hypertension Father   . Alcohol abuse Maternal Uncle     History   Social History  . Marital Status: Divorced    Spouse Name: N/A  . Number of Children: N/A  . Years of Education: N/A   Occupational History  . Not on file.   Social History Main Topics  . Smoking status: Never Smoker   . Smokeless tobacco: Never Used  . Alcohol Use: No  . Drug Use: No  . Sexual Activity: Not Currently   Other Topics Concern  . Not on file   Social History Narrative     Objective: BP 108/72 mmHg  Pulse 77  Wt 218 lb (98.884 kg)  Vital signs reviewed. General: Alert and Oriented, No Acute Distress HEENT: Pupils equal, round, reactive to light. Conjunctivae clear.  External ears unremarkable.  Moist mucous membranes. Lungs: Clear and  comfortable work of breathing, speaking in full sentences without accessory muscle use. Cardiac: Regular rate and rhythm.  Neuro: CN II-XII grossly intact, gait normal. Extremities: No peripheral edema.  Strong peripheral pulses.  Mental Status: No depression, anxiety, nor agitation. Logical though process. Skin: Warm and dry.  Assessment & Plan: Zohra was seen today for medication refill.  Diagnoses and all orders for this visit:  Overactive bladder Orders: -     oxybutynin (DITROPAN) 5 MG tablet; Take 0.5-1 tablets (2.5-5 mg total) by mouth 2 (two) times daily. -     Pneumococcal conjugate vaccine 13-valent  Restless leg  Osteoporosis screening Orders: -     DG Bone Density; Future  Other orders -     gabapentin (NEURONTIN) 300 MG capsule; TAKE 1 TO 2 CAPSULES BY MOUTH FOUR TIMES DAILY   Overactive bladder:  Chronic uncontrolled condition, I went over her formulary with her, Detrol and Vesicare are both tier 3 medications. Oxybutynin is a tier 2 medication and will be cheaper. Discussed possible side effects. Prescription was provided. Restless leg syndrome: Controlled with gabapentin, refills provided per her request  She received Prevnar today  She is overdue for a DEXA scan and needs an order placed for this  Return if symptoms worsen or fail to improve.

## 2014-07-31 ENCOUNTER — Other Ambulatory Visit: Payer: Self-pay | Admitting: Family Medicine

## 2014-08-02 ENCOUNTER — Other Ambulatory Visit: Payer: Self-pay | Admitting: Family Medicine

## 2014-08-09 ENCOUNTER — Encounter: Payer: Self-pay | Admitting: Family Medicine

## 2014-08-11 ENCOUNTER — Telehealth: Payer: Self-pay | Admitting: Family Medicine

## 2014-08-11 ENCOUNTER — Other Ambulatory Visit: Payer: Self-pay | Admitting: *Deleted

## 2014-08-11 DIAGNOSIS — Z13820 Encounter for screening for osteoporosis: Secondary | ICD-10-CM

## 2014-08-11 NOTE — Telephone Encounter (Signed)
Myriam Jacobson will call to schedule with patient

## 2014-08-11 NOTE — Telephone Encounter (Signed)
Donna Sexton, Can you please ask our radiology department why patient has not been contacted about scheduling her DEXA scan.  Also can you tell patient that an OTC magnesium supplement of -  was what I had intended for Donna Sexton.

## 2014-08-11 NOTE — Telephone Encounter (Signed)
Message left on vm about destinies meds

## 2014-08-11 NOTE — Telephone Encounter (Signed)
I can contact the pt about the bone density but I don't know what she is referring to about [place].

## 2014-08-19 ENCOUNTER — Other Ambulatory Visit: Payer: Self-pay

## 2014-08-19 DIAGNOSIS — Z1231 Encounter for screening mammogram for malignant neoplasm of breast: Secondary | ICD-10-CM

## 2014-08-24 ENCOUNTER — Encounter: Payer: Self-pay | Admitting: Family Medicine

## 2014-08-30 ENCOUNTER — Other Ambulatory Visit: Payer: Self-pay | Admitting: Family Medicine

## 2014-09-01 ENCOUNTER — Ambulatory Visit (HOSPITAL_BASED_OUTPATIENT_CLINIC_OR_DEPARTMENT_OTHER)
Admission: RE | Admit: 2014-09-01 | Discharge: 2014-09-01 | Disposition: A | Discharge status: Home / Self Care | Payer: Medicare Other | Source: Ambulatory Visit | Attending: Family Medicine | Admitting: Family Medicine

## 2014-09-01 DIAGNOSIS — Z13820 Encounter for screening for osteoporosis: Secondary | ICD-10-CM | POA: Diagnosis not present

## 2014-09-01 DIAGNOSIS — Z90710 Acquired absence of both cervix and uterus: Secondary | ICD-10-CM | POA: Diagnosis not present

## 2014-09-02 NOTE — Telephone Encounter (Signed)
Approved, Rx sent [facility]

## 2014-09-03 ENCOUNTER — Ambulatory Visit (INDEPENDENT_AMBULATORY_CARE_PROVIDER_SITE_OTHER): Payer: Medicare Other | Admitting: Family Medicine

## 2014-09-03 ENCOUNTER — Encounter: Payer: Self-pay | Admitting: Family Medicine

## 2014-09-03 VITALS — BP 138/82 | HR 68 | Wt 216.0 lb

## 2014-09-03 DIAGNOSIS — N3281 Overactive bladder: Secondary | ICD-10-CM | POA: Diagnosis not present

## 2014-09-03 DIAGNOSIS — R0602 Shortness of breath: Secondary | ICD-10-CM | POA: Diagnosis not present

## 2014-09-03 DIAGNOSIS — R5383 Other fatigue: Secondary | ICD-10-CM | POA: Diagnosis not present

## 2014-09-03 NOTE — Progress Notes (Signed)
CC: Donna Sexton is a 64 y.o. female is here for Fatigue; Shortness of Breath; and Back Pain   Subjective: HPI:  For the majority of the summer the patient has been experiencing some degree of weakness and shortness of breath with exertion. Over the past 2 weeks she's noticed that it's gotten to a degree where she has to stop midway on a flight of stairs and also when out in grocery stores she'll have to stop every few feet to catch her breath. Symptoms are incredibly worse in hot or humid environments. Symptoms are slightly improved in cold environments. She's been able to improve the symptoms to some degree since the air conditioning at home on 68. She denies any chest pain or limb claudication. She denies any motor or sensory strength is 11 that above. She denies any orthopnea, peripheral edema or PND. She denies cough, wheezing or sore throat. She denies difficulty swallowing or chest tightness. Other than above no interventions as of yet. She denies any history of reactive lung disease or any lung disease other than the occasional common cold in Her lifetime. Symptoms are moderate to severe in severity. A chronic daily basis. They've slowly been worsening over the course of the summer. She denies fevers, chills, nor rash.  Review Of Systems Outlined In HPI  Past Medical History  Diagnosis Date  . HYPERLIPIDEMIA 10/29/2007    Qualifier: Diagnosis of  By: Yetta Barre MD, Bernadene Bell.   . BIPOLAR DISORDER UNSPECIFIED 10/29/2007    Qualifier: Diagnosis of  By: Yetta Barre MD, Bernadene Bell.   Marland Kitchen COMMON MIGRAINE 12/29/2008    Qualifier: Diagnosis of  By: Yetta Barre MD, Bernadene Bell.   . HYPERTENSION, BENIGN ESSENTIAL 10/29/2007    Qualifier: Diagnosis of  By: Yetta Barre MD, Bernadene Bell.   Marland Kitchen GERD 07/28/2009    Qualifier: Diagnosis of  By: Felicity Coyer MD, Raenette Rover OVERACTIVE BLADDER 02/21/2008    Qualifier: Diagnosis of  By: Jonny Ruiz MD, Len Blalock   . OSTEOPENIA 10/30/2007    Qualifier: Diagnosis of  By: Yetta Barre MD, Bernadene Bell.   .  ASTHMA NOS W/ACUTE EXACERBATION 01/13/2010    Qualifier: Diagnosis of  By: Yetta Barre MD, Bernadene Bell.   Marland Kitchen SLEEP APNEA 12/31/2009    Qualifier: Diagnosis of  By: Yetta Barre MD, Bernadene Bell.   Marland Kitchen ANEMIA 11/12/2007    Qualifier: Diagnosis of  By: Briscoe Burns CMA, Alvy Beal    . Arthritis   . Anxiety     Past Surgical History  Procedure Laterality Date  . Cholecystectomy    . Abdominal hysterectomy    . Tubal ligation    . Carpal tunnel release    . Breast surgery  1980    implants bilateral  . Parotid endoscopy    . Hernia repair     Family History  Problem Relation Age of Onset  . Heart attack Mother   . Hypertension Mother   . Diabetes Mother   . Heart attack Father   . Hypertension Father   . Alcohol abuse Maternal Uncle     Social History   Social History  . Marital Status: Divorced    Spouse Name: N/A  . Number of Children: N/A  . Years of Education: N/A   Occupational History  . Not on file.   Social History Main Topics  . Smoking status: Never Smoker   . Smokeless tobacco: Never Used  . Alcohol Use: No  . Drug Use: No  . Sexual Activity: Not Currently  Other Topics Concern  . Not on file   Social History Narrative     Objective: BP 138/82 mmHg  Pulse 68  Wt 216 lb (97.977 kg)  SpO2 96%  General: Alert and Oriented, No Acute Distress HEENT: Pupils equal, round, reactive to light. Conjunctivae clear.  External ears unremarkable, canals clear with intact TMs with appropriate landmarks.  Middle ear appears open without effusion. Pink inferior turbinates.  Moist mucous membranes, pharynx without inflammation nor lesions.  Neck supple without palpable lymphadenopathy nor abnormal masses. Lungs: Clear to auscultation bilaterally, no wheezing/ronchi/rales.  Comfortable work of breathing. Good air movement. Cardiac: Regular rate and rhythm. Normal S1/S2.  No murmurs, rubs, nor gallops.   Abdomen: Mild obesity Extremities: No peripheral edema.  Strong peripheral pulses.  Mental  Status: No depression, anxiety, nor agitation. Skin: Warm and dry.  Assessment & Plan: Xochitl was seen today for fatigue, shortness of breath and back pain.  Diagnoses and all orders for this visit:  Overactive bladder  Other fatigue -     TSH -     CBC  SOB (shortness of breath) -     TSH -     CBC   TSH was normal along with a normal hemoglobin. I ordered an echocardiogram and referral to a cardiologist for further evaluation of her significant shortness of breath. I also provided her with an albuterol inhaler that asked her to use as a therapeutic and diagnostic intervention to look into reactive airway disease. Signs and symptoms requring emergent/urgent reevaluation were discussed with the patient. Further plans will be based on the above results.  She has specifically requested Dr. Newt Lukes at [facility]  No Follow-up on file. Marland Kitchen

## 2014-09-04 ENCOUNTER — Telehealth: Payer: Self-pay | Admitting: Family Medicine

## 2014-09-04 ENCOUNTER — Encounter: Payer: Self-pay | Admitting: Family Medicine

## 2014-09-04 DIAGNOSIS — R0600 Dyspnea, unspecified: Secondary | ICD-10-CM

## 2014-09-04 LAB — CBC
HEMATOCRIT: 34.5 % — AB (ref 36.0–46.0)
Hemoglobin: 12.2 g/dL (ref 12.0–15.0)
MCH: 33.3 pg (ref 26.0–34.0)
MCHC: 35.4 g/dL (ref 30.0–36.0)
MCV: 94.3 fL (ref 78.0–100.0)
MPV: 9.6 fL (ref 8.6–12.4)
Platelets: 181 10*3/uL (ref 150–400)
RBC: 3.66 MIL/uL — ABNORMAL LOW (ref 3.87–5.11)
RDW: 12.9 % (ref 11.5–15.5)
WBC: 5.2 10*3/uL (ref 4.0–10.5)

## 2014-09-04 LAB — TSH: TSH: 2.266 u[IU]/mL (ref 0.350–4.500)

## 2014-09-04 MED ORDER — ALBUTEROL SULFATE HFA 108 (90 BASE) MCG/ACT IN AERS: INHALATION_SPRAY | RESPIRATORY_TRACT | Status: DC

## 2014-09-04 NOTE — Telephone Encounter (Signed)
Left message on vm with results  

## 2014-09-04 NOTE — Telephone Encounter (Signed)
Sue Lush, Will you please let patient know that her thyroid function and hemoglobin level were normal.  I would recommend that she try using an albuterol inhaler on a PRN basis for her SOB.  I've also placed a referral to Dr. Marny Lowenstein (cardiology) and an order for an ultrasound of the heart.  If the albuterol inhaler makes a drastic improvement in her symptoms please let me know because the heart [place] and cardiology referral may not be needed in that situation.

## 2014-09-08 ENCOUNTER — Other Ambulatory Visit (HOSPITAL_COMMUNITY): Payer: Self-pay

## 2014-09-11 DIAGNOSIS — L821 Other seborrheic keratosis: Secondary | ICD-10-CM | POA: Insufficient documentation

## 2014-09-29 ENCOUNTER — Ambulatory Visit
Admission: RE | Admit: 2014-09-29 | Discharge: 2014-09-29 | Disposition: A | Discharge status: Home / Self Care | Payer: Medicare Other | Source: Ambulatory Visit

## 2014-09-29 DIAGNOSIS — Z1231 Encounter for screening mammogram for malignant neoplasm of breast: Secondary | ICD-10-CM

## 2014-09-29 LAB — HM MAMMOGRAPHY

## 2014-10-01 ENCOUNTER — Telehealth: Payer: Self-pay | Admitting: Family Medicine

## 2014-10-01 MED ORDER — CYCLOBENZAPRINE HCL 5 MG PO TABS: 5.0000 mg | ORAL_TABLET | Freq: Every day | ORAL | Status: DC

## 2014-10-01 NOTE — Telephone Encounter (Signed)
Refill req 

## 2014-10-03 ENCOUNTER — Encounter: Payer: Self-pay | Admitting: *Deleted

## 2014-10-09 ENCOUNTER — Other Ambulatory Visit: Payer: Self-pay | Admitting: Family Medicine

## 2014-10-14 ENCOUNTER — Ambulatory Visit (INDEPENDENT_AMBULATORY_CARE_PROVIDER_SITE_OTHER): Payer: Medicare Other | Admitting: Family Medicine

## 2014-10-14 ENCOUNTER — Encounter: Payer: Self-pay | Admitting: Family Medicine

## 2014-10-14 VITALS — BP 144/83 | HR 79 | Wt 219.0 lb

## 2014-10-14 DIAGNOSIS — G2581 Restless legs syndrome: Secondary | ICD-10-CM | POA: Diagnosis not present

## 2014-10-14 DIAGNOSIS — Z1159 Encounter for screening for other viral diseases: Secondary | ICD-10-CM

## 2014-10-14 DIAGNOSIS — Z23 Encounter for immunization: Secondary | ICD-10-CM

## 2014-10-14 DIAGNOSIS — M25561 Pain in right knee: Secondary | ICD-10-CM

## 2014-10-14 MED ORDER — CYCLOBENZAPRINE HCL 5 MG PO TABS: 5.0000 mg | ORAL_TABLET | Freq: Every day | ORAL | Status: DC

## 2014-10-14 MED ORDER — GABAPENTIN 300 MG PO CAPS: ORAL_CAPSULE | ORAL | Status: DC

## 2014-10-14 MED ORDER — AMLODIPINE BESYLATE 10 MG PO TABS: ORAL_TABLET | ORAL | Status: DC

## 2014-10-14 NOTE — Progress Notes (Signed)
CC: Donna Sexton is a 64 y.o. female is here for knee & back pain   Subjective: HPI:  Complains of right knee pain that has been present for matter of years however over the past 2 weeks has become more bothersome. She reports a stiffness and a swelling sensation if she's been on it for long hours or after periods of inactivity. It seems to get better for the first few minutes to hours after she warms up. She denies any catching locking or giving way. No interventions as of yet. She localizes to the pain to the front of the knee around the kneecap. She denies any redness. Denies any fevers, chills or joint pain elsewhere.  Request refills on gabapentin for restless leg syndrome. Provided she takes this at least every night she reports significant improvement with symptoms to the point where she can fall sleep easily.  She wants know she should be screened for hepatitis C. She was born during the period that the CDC has recommended individuals get screened at least once for hepatitis C. She denies any rectal quadrant pain or history of hepatitis   Review Of Systems Outlined In HPI  Past Medical History  Diagnosis Date  . HYPERLIPIDEMIA 10/29/2007    Qualifier: Diagnosis of  By: Yetta Barre MD, Bernadene Bell.   . BIPOLAR DISORDER UNSPECIFIED 10/29/2007    Qualifier: Diagnosis of  By: Yetta Barre MD, Bernadene Bell.   Marland Kitchen COMMON MIGRAINE 12/29/2008    Qualifier: Diagnosis of  By: Yetta Barre MD, Bernadene Bell.   . HYPERTENSION, BENIGN ESSENTIAL 10/29/2007    Qualifier: Diagnosis of  By: Yetta Barre MD, Bernadene Bell.   Marland Kitchen GERD 07/28/2009    Qualifier: Diagnosis of  By: Felicity Coyer MD, Raenette Rover OVERACTIVE BLADDER 02/21/2008    Qualifier: Diagnosis of  By: Jonny Ruiz MD, Len Blalock   . OSTEOPENIA 10/30/2007    Qualifier: Diagnosis of  By: Yetta Barre MD, Bernadene Bell.   . ASTHMA NOS W/ACUTE EXACERBATION 01/13/2010    Qualifier: Diagnosis of  By: Yetta Barre MD, Bernadene Bell.   Marland Kitchen SLEEP APNEA 12/31/2009    Qualifier: Diagnosis of  By: Yetta Barre MD, Bernadene Bell.   Marland Kitchen  ANEMIA 11/12/2007    Qualifier: Diagnosis of  By: Briscoe Burns CMA, Alvy Beal    . Arthritis   . Anxiety     Past Surgical History  Procedure Laterality Date  . Cholecystectomy    . Abdominal hysterectomy    . Tubal ligation    . Carpal tunnel release    . Breast surgery  1980    implants bilateral  . Parotid endoscopy    . Hernia repair     Family History  Problem Relation Age of Onset  . Heart attack Mother   . Hypertension Mother   . Diabetes Mother   . Heart attack Father   . Hypertension Father   . Alcohol abuse Maternal Uncle     Social History   Social History  . Marital Status: Divorced    Spouse Name: N/A  . Number of Children: N/A  . Years of Education: N/A   Occupational History  . Not on file.   Social History Main Topics  . Smoking status: Never Smoker   . Smokeless tobacco: Never Used  . Alcohol Use: No  . Drug Use: No  . Sexual Activity: Not Currently   Other Topics Concern  . Not on file   Social History Narrative     Objective: BP 144/83 mmHg  Pulse 79  Wt 219 lb (99.338 kg)  General: Alert and Oriented, No Acute Distress HEENT: Pupils equal, round, reactive to light. Conjunctivae clear.  Moist mucous membranes Lungs: Clear to auscultation bilaterally, no wheezing/ronchi/rales.  Comfortable work of breathing. Good air movement. Cardiac: Regular rate and rhythm. Normal S1/S2.  No murmurs, rubs, nor gallops.   Right knee exam shows full-strength and range of motion. There is no swelling, redness, nor warmth overlying the knee.  No patellar crepitus. No patellar apprehension. No pain with palpation of the inferior patellar pole.  No pain or laxity with valgus nor varus stress. Anterior drawer is negative. McMurray's negative. No popliteal space tenderness or palpable mass. No medial or lateral joint line tenderness to palpation. Extremities: No peripheral edema.  Strong peripheral pulses.  Mental Status: No depression, anxiety, nor agitation. Skin:  Warm and dry.  Assessment & Plan: Donna Sexton was seen today for knee & back pain.  Diagnoses and all orders for this visit:  Right knee pain  Restless leg  Need for hepatitis C screening test -     Hepatitis C antibody  Encounter for immunization  Other orders -     gabapentin (NEURONTIN) 300 MG capsule; TAKE 1 TO 2 CAPSULES BY MOUTH 4 TIMES A DAY. -     cyclobenzaprine (FLEXERIL) 5 MG tablet; Take 1 tablet (5 mg total) by mouth at bedtime. -     amLODipine (NORVASC) 10 MG tablet; TAKE 1 TABLET (10 MG TOTAL) BY MOUTH DAILY. -     Flu Vaccine QUAD 36+ mos IM   Right knee pain, uncontrolled chronic condition, most likely due to patellofemoral syndrome, I given her a home exercise plan to do on a daily basis and if no better after 2-3 weeks follow-up for an injection. Restless leg syndrome: Controlled with gabapentin, Discussed the logic behind needing hepatitis C screening even though she's never had any injection drug use or sex with known hepatitis C carriers. She is more than willing to get this done today.  Return if symptoms worsen or fail to improve.

## 2014-10-15 ENCOUNTER — Other Ambulatory Visit: Payer: Self-pay | Admitting: Physical Medicine and Rehabilitation

## 2014-10-15 ENCOUNTER — Ambulatory Visit
Admission: RE | Admit: 2014-10-15 | Discharge: 2014-10-15 | Disposition: A | Discharge status: Home / Self Care | Payer: Medicare Other | Source: Ambulatory Visit | Attending: Physical Medicine and Rehabilitation | Admitting: Physical Medicine and Rehabilitation

## 2014-10-15 DIAGNOSIS — M545 Low back pain, unspecified: Secondary | ICD-10-CM

## 2014-10-15 DIAGNOSIS — G8929 Other chronic pain: Secondary | ICD-10-CM

## 2014-10-15 LAB — HEPATITIS C ANTIBODY: HCV AB: NEGATIVE

## 2014-10-28 ENCOUNTER — Encounter: Payer: Self-pay | Admitting: Family Medicine

## 2014-11-03 ENCOUNTER — Telehealth: Payer: Self-pay

## 2014-11-03 NOTE — Telephone Encounter (Signed)
Pt has agreed to see Dr. Karie Schwalbe or Dr. Denyse Amassorey if there isn't a long wait.

## 2014-11-03 NOTE — Telephone Encounter (Signed)
Will you please let patient know that I am not trained in this procedure but if she wants to see one of our sports medicine doctors they might be able to help, they do this for many patients every day.

## 2014-11-03 NOTE — Telephone Encounter (Signed)
Pt would like an referral to [facility] for an MRI and to get shots in her back.  Pt stated that she does not want to take the physical therapy steps for her back pain and would rather have this done instead.

## 2014-11-03 NOTE — Telephone Encounter (Signed)
Ok, a situation like this only requires her to contact the front desk about scheduling whoever she wants to see.

## 2014-11-04 NOTE — Telephone Encounter (Signed)
Voicemail left for pt.

## 2014-11-05 ENCOUNTER — Ambulatory Visit (INDEPENDENT_AMBULATORY_CARE_PROVIDER_SITE_OTHER): Payer: Medicare Other | Admitting: Family Medicine

## 2014-11-05 ENCOUNTER — Ambulatory Visit: Payer: Self-pay | Admitting: Family Medicine

## 2014-11-05 ENCOUNTER — Encounter: Payer: Self-pay | Admitting: Family Medicine

## 2014-11-05 VITALS — BP 130/72 | HR 77 | Wt 216.0 lb

## 2014-11-05 DIAGNOSIS — G8929 Other chronic pain: Secondary | ICD-10-CM | POA: Diagnosis not present

## 2014-11-05 DIAGNOSIS — M545 Low back pain, unspecified: Secondary | ICD-10-CM | POA: Insufficient documentation

## 2014-11-05 NOTE — Patient Instructions (Signed)
Thank you for coming in today. Come back or go to the emergency room if you notice new weakness new numbness problems walking or bowel or bladder problems. I will call following the MRI.

## 2014-11-05 NOTE — Progress Notes (Signed)
   Subjective:    I'm seeing this patient as a consultation for:  Dr. Ivan AnchorsHommel  CC: Back Pain  HPI: Patient notes chronic low back pain. This is been ongoing for years. In 2010 she had a series of facet injections to her L-spine which helped a lot. Recently her pain has been worse. She denies any radiating pain weakness or numbness fevers or chills. She's tried over-the-counter and prescription medications for pain which helped a little. She's discuss this problem with surgeon in the past to recommended ablation of the facet joints which she is not ready for this time. She's had recent x-rays which do show arthritis of the lumbar spine. Patient has done home exercises/therapy which has not helped.  Past medical history, Surgical history, Family history not pertinant except as noted below, Social history, Allergies, and medications have been entered into the medical record, reviewed, and no changes needed.   Review of Systems: No headache, visual changes, nausea, vomiting, diarrhea, constipation, dizziness, abdominal pain, skin rash, fevers, chills, night sweats, weight loss, swollen lymph nodes, body aches, joint swelling, muscle aches, chest pain, shortness of breath, mood changes, visual or auditory hallucinations.   Objective:    Filed Vitals:   11/05/14 0929  BP: 130/72  Pulse: 77   General: Well Developed, well nourished, and in no acute distress.  Neuro/Psych: Alert and oriented x3, extra-ocular muscles intact, able to move all 4 extremities, sensation grossly intact. Skin: Warm and dry, no rashes noted.  Respiratory: Not using accessory muscles, speaking in full sentences, trachea midline.  Cardiovascular: Pulses palpable, no extremity edema. Abdomen: Does not appear distended. MSK: Back: Nontender to midline. Tender palpation bilateral lumbar paraspinals and SI joint. Range of motion intact with some pain with extension. Negative straight leg raise test bilaterally. Positive Faber  test bilaterally. Lower extremity strength is intact throughout. As is sensation. Normal gait.  X-ray L-spine and pelvis from 10/15/2014 reviewed.  No results found for this or any previous visit (from the past 24 hour(s)). No results found.  Impression and Recommendations:   This case required medical decision making of moderate complexity.

## 2014-11-05 NOTE — Assessment & Plan Note (Signed)
Likely due to facet arthropathy. Plan for MRI for planning of facet injections in the near future. Patient has failed conservative management.

## 2014-11-17 ENCOUNTER — Other Ambulatory Visit: Payer: Self-pay | Admitting: Family Medicine

## 2014-11-23 ENCOUNTER — Other Ambulatory Visit: Payer: Self-pay | Admitting: Family Medicine

## 2014-11-25 ENCOUNTER — Ambulatory Visit
Admission: RE | Admit: 2014-11-25 | Discharge: 2014-11-25 | Disposition: A | Discharge status: Home / Self Care | Payer: Medicare Other | Source: Ambulatory Visit | Attending: Family Medicine | Admitting: Family Medicine

## 2014-11-25 DIAGNOSIS — M545 Low back pain, unspecified: Secondary | ICD-10-CM

## 2014-11-25 DIAGNOSIS — G8929 Other chronic pain: Secondary | ICD-10-CM

## 2014-11-26 ENCOUNTER — Telehealth: Payer: Self-pay | Admitting: Family Medicine

## 2014-11-26 ENCOUNTER — Other Ambulatory Visit: Payer: Self-pay

## 2014-11-26 ENCOUNTER — Encounter: Payer: Self-pay | Admitting: Family Medicine

## 2014-11-26 DIAGNOSIS — M545 Low back pain, unspecified: Secondary | ICD-10-CM

## 2014-11-26 MED ORDER — ATORVASTATIN CALCIUM 40 MG PO TABS: 40.0000 mg | ORAL_TABLET | Freq: Every day | ORAL | Status: DC

## 2014-11-26 NOTE — Telephone Encounter (Signed)
Left scheduling VM with [facility]. Left detailed vm notifying pt.

## 2014-11-26 NOTE — Telephone Encounter (Signed)
Facet injection ordered 

## 2014-12-08 ENCOUNTER — Ambulatory Visit
Admission: RE | Admit: 2014-12-08 | Discharge: 2014-12-08 | Disposition: A | Discharge status: Home / Self Care | Payer: Medicare Other | Source: Ambulatory Visit | Attending: Family Medicine | Admitting: Family Medicine

## 2014-12-08 DIAGNOSIS — M545 Low back pain, unspecified: Secondary | ICD-10-CM

## 2014-12-08 MED ORDER — METHYLPREDNISOLONE ACETATE 40 MG/ML INJ SUSP (RADIOLOG
120.0000 mg | Freq: Once | INTRAMUSCULAR | Status: AC
Start: 2014-12-08 — End: 2014-12-08
  Administered 2014-12-08: 120 mg via INTRA_ARTICULAR

## 2014-12-08 MED ORDER — IOHEXOL 180 MG/ML  SOLN
1.0000 mL | Freq: Once | INTRAMUSCULAR | Status: AC | PRN
  Administered 2014-12-08: 1 mL via INTRA_ARTICULAR

## 2014-12-08 NOTE — Discharge Instructions (Signed)

## 2014-12-22 ENCOUNTER — Other Ambulatory Visit: Payer: Self-pay | Admitting: Family Medicine

## 2015-01-14 ENCOUNTER — Other Ambulatory Visit: Payer: Self-pay | Admitting: Family Medicine

## 2015-01-15 ENCOUNTER — Other Ambulatory Visit: Payer: Self-pay | Admitting: Family Medicine

## 2015-01-24 ENCOUNTER — Other Ambulatory Visit: Payer: Self-pay | Admitting: Family Medicine

## 2015-01-29 ENCOUNTER — Other Ambulatory Visit: Payer: Self-pay | Admitting: Family Medicine

## 2015-02-04 ENCOUNTER — Ambulatory Visit (INDEPENDENT_AMBULATORY_CARE_PROVIDER_SITE_OTHER): Payer: Medicare Other | Admitting: Family Medicine

## 2015-02-04 ENCOUNTER — Encounter: Payer: Self-pay | Admitting: Family Medicine

## 2015-02-04 VITALS — BP 109/79 | HR 75 | Wt 223.0 lb

## 2015-02-04 DIAGNOSIS — G2581 Restless legs syndrome: Secondary | ICD-10-CM

## 2015-02-04 DIAGNOSIS — N3281 Overactive bladder: Secondary | ICD-10-CM

## 2015-02-04 DIAGNOSIS — I10 Essential (primary) hypertension: Secondary | ICD-10-CM

## 2015-02-04 MED ORDER — LABETALOL HCL 200 MG PO TABS: ORAL_TABLET | ORAL | Status: DC

## 2015-02-04 MED ORDER — ATORVASTATIN CALCIUM 40 MG PO TABS: 40.0000 mg | ORAL_TABLET | Freq: Every day | ORAL | Status: DC

## 2015-02-04 MED ORDER — OXYBUTYNIN CHLORIDE 5 MG PO TABS: 2.5000 mg | ORAL_TABLET | Freq: Two times a day (BID) | ORAL | Status: DC

## 2015-02-04 MED ORDER — GABAPENTIN 300 MG PO CAPS: 300.0000 mg | ORAL_CAPSULE | Freq: Four times a day (QID) | ORAL | Status: DC

## 2015-02-04 MED ORDER — AMLODIPINE BESYLATE 10 MG PO TABS: ORAL_TABLET | ORAL | Status: DC

## 2015-02-04 NOTE — Progress Notes (Signed)
CC: Donna Sexton is a 65 y.o. female is here for Medication Refill and Medication Management   Subjective: HPI:  Follow-up essential hypertension: Requesting refills on amlodipine and labetalol. No outside blood pressures to report. She denies chest pain shortness of breath orthopnea or peripheral edema.  Follow-up overactive bladder. She's requesting a refill of ditropan. She states that this is helping because if she forgets to take a dose she has difficulty with small urinary accidents. If she takes this on a daily basis symptoms are 99% resolved and not interfering with quality of life. She denies dysuria, urgency, hesitancy or any odor to her urine.  Follow-up restless leg: She's requesting a refill on gabapentin. She denies any issues with falling asleep or motor or sensory disturbances. She denies any known side effects from gabapentin.   Review Of Systems Outlined In HPI  Past Medical History  Diagnosis Date  . HYPERLIPIDEMIA 10/29/2007    Qualifier: Diagnosis of  By: Yetta Barre MD, Bernadene Bell.   . BIPOLAR DISORDER UNSPECIFIED 10/29/2007    Qualifier: Diagnosis of  By: Yetta Barre MD, Bernadene Bell.   Marland Kitchen COMMON MIGRAINE 12/29/2008    Qualifier: Diagnosis of  By: Yetta Barre MD, Bernadene Bell.   . HYPERTENSION, BENIGN ESSENTIAL 10/29/2007    Qualifier: Diagnosis of  By: Yetta Barre MD, Bernadene Bell.   Marland Kitchen GERD 07/28/2009    Qualifier: Diagnosis of  By: Felicity Coyer MD, Raenette Rover OVERACTIVE BLADDER 02/21/2008    Qualifier: Diagnosis of  By: Jonny Ruiz MD, Len Blalock   . OSTEOPENIA 10/30/2007    Qualifier: Diagnosis of  By: Yetta Barre MD, Bernadene Bell.   . ASTHMA NOS W/ACUTE EXACERBATION 01/13/2010    Qualifier: Diagnosis of  By: Yetta Barre MD, Bernadene Bell.   Marland Kitchen SLEEP APNEA 12/31/2009    Qualifier: Diagnosis of  By: Yetta Barre MD, Bernadene Bell.   Marland Kitchen ANEMIA 11/12/2007    Qualifier: Diagnosis of  By: Briscoe Burns CMA, Alvy Beal    . Arthritis   . Anxiety     Past Surgical History  Procedure Laterality Date  . Cholecystectomy    . Abdominal hysterectomy    .  Tubal ligation    . Carpal tunnel release    . Breast surgery  1980    implants bilateral  . Parotid endoscopy    . Hernia repair     Family History  Problem Relation Age of Onset  . Heart attack Mother   . Hypertension Mother   . Diabetes Mother   . Heart attack Father   . Hypertension Father   . Alcohol abuse Maternal Uncle     Social History   Social History  . Marital Status: Divorced    Spouse Name: N/A  . Number of Children: N/A  . Years of Education: N/A   Occupational History  . Not on file.   Social History Main Topics  . Smoking status: Never Smoker   . Smokeless tobacco: Never Used  . Alcohol Use: No  . Drug Use: No  . Sexual Activity: Not Currently   Other Topics Concern  . Not on file   Social History Narrative     Objective: BP 109/79 mmHg  Pulse 75  Wt 223 lb (101.152 kg)  General: Alert and Oriented, No Acute Distress HEENT: Pupils equal, round, reactive to light. Conjunctivae clear. Moist mucous membranes Lungs: Clear to auscultation bilaterally, no wheezing/ronchi/rales.  Comfortable work of breathing. Good air movement. Cardiac: Regular rate and rhythm. Normal S1/S2.  No murmurs, rubs, nor gallops.  Extremities: No peripheral edema.  Strong peripheral pulses.  Mental Status: No depression, anxiety, nor agitation. Skin: Warm and dry.  Assessment & Plan: Donna Sexton was seen today for medication refill and medication management.  Diagnoses and all orders for this visit:  Essential hypertension  Overactive bladder -     oxybutynin (DITROPAN) 5 MG tablet; Take 0.5-1 tablets (2.5-5 mg total) by mouth 2 (two) times daily.  Restless leg  Other orders -     amLODipine (NORVASC) 10 MG tablet; TAKE 1 TABLET (10 MG TOTAL) BY MOUTH DAILY. -     atorvastatin (LIPITOR) 40 MG tablet; Take 1 tablet (40 mg total) by mouth daily. -     labetalol (NORMODYNE) 200 MG tablet; Take 2 tablets in the morning and 1 tablet in the evening. -     gabapentin  (NEURONTIN) 300 MG capsule; Take 1-2 capsules (300-600 mg total) by mouth 4 (four) times daily.  essential hypertension: Controlled continue amlodipine and labetalol Overactive bladder: Controlled with ditropan Restless leg syndrome: Controlled with gabapentin.   Return in about 3 months (around 05/05/2015).

## 2015-02-13 ENCOUNTER — Other Ambulatory Visit: Payer: Self-pay

## 2015-02-13 MED ORDER — PROMETHAZINE HCL 25 MG PO TABS: ORAL_TABLET | ORAL | Status: DC

## 2015-02-27 ENCOUNTER — Telehealth: Payer: Self-pay

## 2015-02-27 MED ORDER — PROMETHAZINE HCL 25 MG PO TABS: ORAL_TABLET | ORAL | Status: DC

## 2015-02-27 NOTE — Telephone Encounter (Signed)
I'll print off an Rx that she can use to shop around. If she goes to the website GoodRX.com she can enter the name of this medication and download different savings vouchers for local pharmacies, if she uses this resource it will cost less than $10 each refill.

## 2015-02-27 NOTE — Telephone Encounter (Signed)
Pt stated that her insurance will not cover promethazine. She said that you helped her with this problem last year. Please advise.

## 2015-02-27 NOTE — Telephone Encounter (Signed)
Pt.notified

## 2015-03-02 ENCOUNTER — Encounter: Payer: Self-pay | Admitting: Radiology

## 2015-03-10 ENCOUNTER — Telehealth: Payer: Self-pay | Admitting: *Deleted

## 2015-03-10 ENCOUNTER — Encounter: Payer: Self-pay | Admitting: Family Medicine

## 2015-03-10 NOTE — Telephone Encounter (Signed)
Pt notified and she will call back to schedule appt.

## 2015-03-10 NOTE — Telephone Encounter (Signed)
I discussed with the radiologist. Plan to have patient come back for a repeat visit.

## 2015-03-10 NOTE — Telephone Encounter (Signed)
Patient called and states Dr. Lacy Duverney at [facility] had been trying to get in touch with Dr. Denyse Amass to discuss a nerve ablation for this patient. She states a message was sent through [facility] by [facility] to our office but they have not heard back. She requests Dr. Kandee Keen to call to speak to Dr. Lacy Duverney in re to a nerve ablation procedure for this patient.

## 2015-03-15 ENCOUNTER — Encounter: Payer: Self-pay | Admitting: Family Medicine

## 2015-03-20 NOTE — Telephone Encounter (Signed)
I called the patient and left her a voice mail in re to email she sent today.I had sent her an email response yesterday but apparently she did not see it. We are waiting to hear back from the insurance. This will most likely be denied. i believe [facility] has this medication for about $10 for 30 or 40 tablets. The patient is receiving this medication already for less than $2 per her, but he insurance is sending her notification that they are supplying a temp supply and that's were the PA is coming from. In these situations they supply temp supply because the medication is not meant to be taken on a long term basis. The patient takes this medication because some of her other meds make her nauseous

## 2015-03-23 ENCOUNTER — Telehealth: Payer: Self-pay

## 2015-03-23 MED ORDER — PROMETHAZINE HCL 25 MG PO TABS: ORAL_TABLET | ORAL | Status: DC

## 2015-03-23 NOTE — Telephone Encounter (Signed)
Left message on vm with dr's advice

## 2015-03-23 NOTE — Telephone Encounter (Signed)
Donna Sexton, Will you please let patient know that if her insurance will not cover promethazine I'm going to print off an additional Rx for this drug since it is on the $4 list at [facility]. She might have to just pay out of pocket for this medication.

## 2015-03-24 NOTE — Telephone Encounter (Signed)
Pt will be picking Rx today

## 2015-03-30 NOTE — Telephone Encounter (Signed)
Will you please let patient now that [employer] [facility] sent me a letter today stating that they will now cover promethazine for another 365 days.

## 2015-03-30 NOTE — Telephone Encounter (Signed)
Pt.notified

## 2015-04-16 ENCOUNTER — Telehealth: Payer: Self-pay | Admitting: *Deleted

## 2015-04-16 DIAGNOSIS — M545 Low back pain, unspecified: Secondary | ICD-10-CM

## 2015-04-16 DIAGNOSIS — G8929 Other chronic pain: Secondary | ICD-10-CM

## 2015-04-16 NOTE — Telephone Encounter (Signed)
Pt called yesterday and states she would like a a second opinion and wants a referral sent to  Shirlean Kellyobert Nudelman MD for lower back pain

## 2015-04-20 DIAGNOSIS — F331 Major depressive disorder, recurrent, moderate: Secondary | ICD-10-CM | POA: Diagnosis not present

## 2015-04-29 NOTE — Telephone Encounter (Signed)
Please place referral for the following provider per pt request:  [facility]ist: Hewitt ShortsNudelman Robert W MD Doctor [place], [facility] Address: [address], [place], [place] [address] Phone: [phone]

## 2015-04-30 NOTE — Telephone Encounter (Signed)
Referral ordered

## 2015-05-04 ENCOUNTER — Encounter: Payer: Self-pay | Admitting: Family Medicine

## 2015-05-04 ENCOUNTER — Ambulatory Visit (INDEPENDENT_AMBULATORY_CARE_PROVIDER_SITE_OTHER): Payer: Medicare Other | Admitting: Family Medicine

## 2015-05-04 VITALS — BP 133/85 | HR 79 | Wt 220.0 lb

## 2015-05-04 DIAGNOSIS — R197 Diarrhea, unspecified: Secondary | ICD-10-CM

## 2015-05-04 DIAGNOSIS — M67472 Ganglion, left ankle and foot: Secondary | ICD-10-CM | POA: Diagnosis not present

## 2015-05-04 MED ORDER — DIPHENOXYLATE-ATROPINE 2.5-0.025 MG PO TABS: 1.0000 | ORAL_TABLET | Freq: Four times a day (QID) | ORAL | Status: DC | PRN

## 2015-05-04 NOTE — Progress Notes (Signed)
CC: Donna Sexton is a 65 y.o. female is here for Bleeding/Bruising   Subjective: HPI:  She noticed a growth on the left foot that showed up a little under 2 weeks ago. It occurred overnight without any recent or remote trauma. It's been painless since onset. She's never had this before. She denies any recent or remote pain at this location. She noticed that about 5 days ago it developed some bruising around the site which was spreading but has not progressed for the past 2 days. No interventions as of yet. Denies bruising or bleeding abnormalities elsewhere. She denies joint pain anywhere right now.  She is requesting a refill on Lomotil. She's taken this in the past when Lomotil A D is ineffective for occasional diarrhea. She denies any symptoms right now pertaining to her gastrointestinal system. She denies fevers, chills, nausea nor constipation.   Review Of Systems Outlined In HPI  Past Medical History  Diagnosis Date  . HYPERLIPIDEMIA 10/29/2007    Qualifier: Diagnosis of  By: Yetta Barre MD, Bernadene Bell.   . BIPOLAR DISORDER UNSPECIFIED 10/29/2007    Qualifier: Diagnosis of  By: Yetta Barre MD, Bernadene Bell.   Marland Kitchen COMMON MIGRAINE 12/29/2008    Qualifier: Diagnosis of  By: Yetta Barre MD, Bernadene Bell.   . HYPERTENSION, BENIGN ESSENTIAL 10/29/2007    Qualifier: Diagnosis of  By: Yetta Barre MD, Bernadene Bell.   Marland Kitchen GERD 07/28/2009    Qualifier: Diagnosis of  By: Felicity Coyer MD, Raenette Rover OVERACTIVE BLADDER 02/21/2008    Qualifier: Diagnosis of  By: Jonny Ruiz MD, Len Blalock   . OSTEOPENIA 10/30/2007    Qualifier: Diagnosis of  By: Yetta Barre MD, Bernadene Bell.   . ASTHMA NOS W/ACUTE EXACERBATION 01/13/2010    Qualifier: Diagnosis of  By: Yetta Barre MD, Bernadene Bell.   Marland Kitchen SLEEP APNEA 12/31/2009    Qualifier: Diagnosis of  By: Yetta Barre MD, Bernadene Bell.   Marland Kitchen ANEMIA 11/12/2007    Qualifier: Diagnosis of  By: Briscoe Burns CMA, Alvy Beal    . Arthritis   . Anxiety     Past Surgical History  Procedure Laterality Date  . Cholecystectomy    . Abdominal hysterectomy     . Tubal ligation    . Carpal tunnel release    . Breast surgery  1980    implants bilateral  . Parotid endoscopy    . Hernia repair     Family History  Problem Relation Age of Onset  . Heart attack Mother   . Hypertension Mother   . Diabetes Mother   . Heart attack Father   . Hypertension Father   . Alcohol abuse Maternal Uncle     Social History   Social History  . Marital Status: Divorced    Spouse Name: N/A  . Number of Children: N/A  . Years of Education: N/A   Occupational History  . Not on file.   Social History Main Topics  . Smoking status: Never Smoker   . Smokeless tobacco: Never Used  . Alcohol Use: No  . Drug Use: No  . Sexual Activity: Not Currently   Other Topics Concern  . Not on file   Social History Narrative     Objective: BP 133/85 mmHg  Pulse 79  Wt 220 lb (99.791 kg)  Vital signs reviewed. General: Alert and Oriented, No Acute Distress HEENT: Pupils equal, round, reactive to light. Conjunctivae clear.  External ears unremarkable.  Moist mucous membranes. Lungs: Clear and comfortable work of breathing, speaking in full  sentences without accessory muscle use. Cardiac: Regular rate and rhythm.  Neuro: CN II-XII grossly intact, gait normal. Extremities: No peripheral edema.  Strong peripheral pulses. On the left foot there is a mobile nontender mass between the heads of the second and third metatarsals projecting up into the dorsal surface of the foot. Surrounding this there is some mild ecchymosis Mental Status: No depression, anxiety, nor agitation. Logical though process. Skin: Warm and dry.  Assessment & Plan: Donna Sexton was seen today for bleeding/bruising.  Diagnoses and all orders for this visit:  Diarrhea, unspecified type  Ganglion cyst of left foot  Other orders -     diphenoxylate-atropine (LOMOTIL) 2.5-0.025 MG tablet; Take 1 tablet by mouth 4 (four) times daily as needed for diarrhea or loose stools.  Diarrhea: A  symptomatically now but it's perfectly reasonable for her to have Lomotil at home for when necessary use Ganglion cyst of left foot: Discussed benign nature of this and that I would be happy to drain it and inject some steroid however she understandably would like to just leave it alone and see how he progresses over the next couple weeks to months.  No Follow-up on file.

## 2015-05-06 ENCOUNTER — Telehealth: Payer: Self-pay | Admitting: *Deleted

## 2015-05-06 NOTE — Telephone Encounter (Signed)
PA initiated for generic lomotil through covermymeds

## 2015-05-08 ENCOUNTER — Telehealth: Payer: Self-pay

## 2015-05-08 DIAGNOSIS — R110 Nausea: Secondary | ICD-10-CM

## 2015-05-08 DIAGNOSIS — E785 Hyperlipidemia, unspecified: Secondary | ICD-10-CM

## 2015-05-08 DIAGNOSIS — I10 Essential (primary) hypertension: Secondary | ICD-10-CM

## 2015-05-08 NOTE — Telephone Encounter (Signed)
Pt stated that during her visit she was told to let you know when she was willing to get her lab work done.  She wants to have labs done on Monday. Please advise.

## 2015-05-08 NOTE — Telephone Encounter (Signed)
Labs in your in box 

## 2015-05-08 NOTE — Telephone Encounter (Signed)
Pt.notified

## 2015-05-08 NOTE — Telephone Encounter (Signed)
Your request has been approved  Effective from 05/06/2015 through 05/05/2016.  Left message on patient's vm and pharm vm

## 2015-05-11 DIAGNOSIS — E785 Hyperlipidemia, unspecified: Secondary | ICD-10-CM | POA: Diagnosis not present

## 2015-05-11 DIAGNOSIS — I10 Essential (primary) hypertension: Secondary | ICD-10-CM | POA: Diagnosis not present

## 2015-05-11 DIAGNOSIS — R110 Nausea: Secondary | ICD-10-CM | POA: Diagnosis not present

## 2015-05-12 ENCOUNTER — Telehealth: Payer: Self-pay | Admitting: Family Medicine

## 2015-05-12 DIAGNOSIS — R739 Hyperglycemia, unspecified: Secondary | ICD-10-CM

## 2015-05-12 LAB — LIPID PANEL
Cholesterol: 179 mg/dL (ref 125–200)
HDL: 55 mg/dL (ref >=46)
LDL Cholesterol: 98 mg/dL (ref <130)
Total CHOL/HDL Ratio: 3.3 Ratio (ref <=5.0)
Triglycerides: 132 mg/dL (ref <150)
VLDL: 26 mg/dL (ref <30)

## 2015-05-12 LAB — COMPLETE METABOLIC PANEL WITH GFR
ALBUMIN: 4.1 g/dL (ref 3.6–5.1)
ALK PHOS: 65 U/L (ref 33–130)
ALT: 127 U/L — ABNORMAL HIGH (ref 6–29)
AST: 70 U/L — ABNORMAL HIGH (ref 10–35)
BILIRUBIN TOTAL: 0.7 mg/dL (ref 0.2–1.2)
BUN: 13 mg/dL (ref 7–25)
CALCIUM: 9.5 mg/dL (ref 8.6–10.4)
CO2: 26 mmol/L (ref 20–31)
Chloride: 100 mmol/L (ref 98–110)
Creat: 0.65 mg/dL (ref 0.50–0.99)
GLUCOSE: 108 mg/dL — AB (ref 65–99)
Potassium: 4.9 mmol/L (ref 3.5–5.3)
Sodium: 137 mmol/L (ref 135–146)
TOTAL PROTEIN: 6.1 g/dL (ref 6.1–8.1)

## 2015-05-12 MED ORDER — VITAMIN E 180 MG (400 UNIT) PO CAPS: 400.0000 [IU] | ORAL_CAPSULE | Freq: Every day | ORAL | Status: DC

## 2015-05-12 NOTE — Telephone Encounter (Signed)
Will you please let patient know that her cholesterol remains under control but her blood sugar was mildly elevated.  I'd recommend that when she follows up within the next three months she have her 3 month average blood sugar checked with a finger stick blood sample. Also her liver enzymes were mildly elevated reflecting some mild liver inflammation. This is most likely due to fat being stored in the liver. This can be improved with weight loss and starting an OTC 400 unit vitamin e capsule daily.

## 2015-05-12 NOTE — Telephone Encounter (Signed)
Pt.notified

## 2015-05-13 ENCOUNTER — Telehealth: Payer: Self-pay | Admitting: *Deleted

## 2015-05-13 NOTE — Telephone Encounter (Signed)
PA for cyclobnzaprine. Pt tried and failed Ibuprofen, Acetaminophen,Ultram,diclofenac  PA initiated through covermymed

## 2015-05-18 ENCOUNTER — Encounter: Payer: Self-pay | Admitting: Family Medicine

## 2015-05-18 ENCOUNTER — Ambulatory Visit (INDEPENDENT_AMBULATORY_CARE_PROVIDER_SITE_OTHER): Payer: Medicare Other | Admitting: Family Medicine

## 2015-05-18 VITALS — BP 122/80 | HR 81 | Wt 222.0 lb

## 2015-05-18 DIAGNOSIS — E785 Hyperlipidemia, unspecified: Secondary | ICD-10-CM | POA: Diagnosis not present

## 2015-05-18 DIAGNOSIS — R7989 Other specified abnormal findings of blood chemistry: Secondary | ICD-10-CM | POA: Diagnosis not present

## 2015-05-18 DIAGNOSIS — R739 Hyperglycemia, unspecified: Secondary | ICD-10-CM | POA: Diagnosis not present

## 2015-05-18 DIAGNOSIS — R945 Abnormal results of liver function studies: Secondary | ICD-10-CM

## 2015-05-18 LAB — POCT GLYCOSYLATED HEMOGLOBIN (HGB A1C): HEMOGLOBIN A1C: 5.4

## 2015-05-18 NOTE — Addendum Note (Signed)
Addended by: Thom ChimesHENRY, Callie Bunyard M on: 05/18/2015 03:28 PM   Modules accepted: Orders

## 2015-05-18 NOTE — Progress Notes (Signed)
CC: Donna Sexton is a 65 y.o. female is here for Results   Subjective: HPI:  Follow-up hyperlipidemia: She would like to go over her recent lipid panel. She is taking atorvastatin on a daily basis without any side effects. She denies right upper quadrant pain nor myalgias.  She would also like to discuss the finding of a mild hyperglycemia on her most recent metabolic panel. She denies polyuria or polyphagia or polydipsia. She's been taking a meal replacement drink that has 20 g of simple sugars in it. She has a strong family history of type 2 diabetes.  She was also recently found to have mild to moderately elevated liver function tests. She's not drink any alcohol in over a decade and is not using any acetaminophen containing products. She denies any jaundice, nausea or gastrointestinal complaints. Denies fevers or chills.     Review Of Systems Outlined In HPI  Past Medical History  Diagnosis Date  . HYPERLIPIDEMIA 10/29/2007    Qualifier: Diagnosis of  By: Yetta BarreJones MD, Bernadene Bellhomas L.   . BIPOLAR DISORDER UNSPECIFIED 10/29/2007    Qualifier: Diagnosis of  By: Yetta BarreJones MD, Bernadene Bellhomas L.   Marland Kitchen. COMMON MIGRAINE 12/29/2008    Qualifier: Diagnosis of  By: Yetta BarreJones MD, Bernadene Bellhomas L.   . HYPERTENSION, BENIGN ESSENTIAL 10/29/2007    Qualifier: Diagnosis of  By: Yetta BarreJones MD, Bernadene Bellhomas L.   Marland Kitchen. GERD 07/28/2009    Qualifier: Diagnosis of  By: Felicity CoyerLeschber MD, Raenette RoverValerie A   . OVERACTIVE BLADDER 02/21/2008    Qualifier: Diagnosis of  By: Jonny RuizJohn MD, Len BlalockJames W   . OSTEOPENIA 10/30/2007    Qualifier: Diagnosis of  By: Yetta BarreJones MD, Bernadene Bellhomas L.   . ASTHMA NOS W/ACUTE EXACERBATION 01/13/2010    Qualifier: Diagnosis of  By: Yetta BarreJones MD, Bernadene Bellhomas L.   Marland Kitchen. SLEEP APNEA 12/31/2009    Qualifier: Diagnosis of  By: Yetta BarreJones MD, Bernadene Bellhomas L.   Marland Kitchen. ANEMIA 11/12/2007    Qualifier: Diagnosis of  By: Briscoe BurnsArchie CMA, Alvy BealLakisha    . Arthritis   . Anxiety     Past Surgical History  Procedure Laterality Date  . Cholecystectomy    . Abdominal hysterectomy    . Tubal ligation     . Carpal tunnel release    . Breast surgery  1980    implants bilateral  . Parotid endoscopy    . Hernia repair     Family History  Problem Relation Age of Onset  . Heart attack Mother   . Hypertension Mother   . Diabetes Mother   . Heart attack Father   . Hypertension Father   . Alcohol abuse Maternal Uncle     Social History   Social History  . Marital Status: Divorced    Spouse Name: N/A  . Number of Children: N/A  . Years of Education: N/A   Occupational History  . Not on file.   Social History Main Topics  . Smoking status: Never Smoker   . Smokeless tobacco: Never Used  . Alcohol Use: No  . Drug Use: No  . Sexual Activity: Not Currently   Other Topics Concern  . Not on file   Social History Narrative     Objective: BP 122/80 mmHg  Pulse 81  Wt 222 lb (100.699 kg)  Vital signs reviewed. General: Alert and Oriented, No Acute Distress HEENT: Pupils equal, round, reactive to light. Conjunctivae clear.  External ears unremarkable.  Moist mucous membranes. Lungs: Clear and comfortable work of breathing, speaking in full sentences  without accessory muscle use. Cardiac: Regular rate and rhythm.  Neuro: CN II-XII grossly intact, gait normal. Extremities: No peripheral edema.  Strong peripheral pulses.  Mental Status: No depression, anxiety, nor agitation. Logical though process. Skin: Warm and dry.   Assessment & Plan: Donna Sexton was seen today for results.  Diagnoses and all orders for this visit:  Hyperlipidemia  Hyperglycemia  Elevated LFTs   Hyperlipidemia: Controlled with atorvastatin Hyperglycemia: A1c was checked today with a 5.4 value, discussed ways to lower blood sugar with diet and exercise. Elevated LFTs most likely due to hepatic steatosis: Discussed that this is reversible in the early stages and that she can likely improve her liver function tests by starting a walking regimen for 30 minutes every day.Signs and symptoms requring  emergent/urgent reevaluation were discussed with the patient.  Return in about 3 months (around 08/18/2015) for Liver and Sugar Test.

## 2015-06-02 DIAGNOSIS — M545 Low back pain: Secondary | ICD-10-CM | POA: Diagnosis not present

## 2015-06-02 DIAGNOSIS — Z6837 Body mass index (BMI) 37.0-37.9, adult: Secondary | ICD-10-CM | POA: Diagnosis not present

## 2015-06-02 DIAGNOSIS — M47816 Spondylosis without myelopathy or radiculopathy, lumbar region: Secondary | ICD-10-CM | POA: Diagnosis not present

## 2015-06-02 DIAGNOSIS — G8929 Other chronic pain: Secondary | ICD-10-CM | POA: Diagnosis not present

## 2015-06-02 DIAGNOSIS — M4806 Spinal stenosis, lumbar region: Secondary | ICD-10-CM | POA: Diagnosis not present

## 2015-06-02 DIAGNOSIS — M549 Dorsalgia, unspecified: Secondary | ICD-10-CM | POA: Diagnosis not present

## 2015-06-02 DIAGNOSIS — M5136 Other intervertebral disc degeneration, lumbar region: Secondary | ICD-10-CM | POA: Diagnosis not present

## 2015-06-03 ENCOUNTER — Other Ambulatory Visit: Payer: Self-pay | Admitting: Neurosurgery

## 2015-06-03 DIAGNOSIS — M5136 Other intervertebral disc degeneration, lumbar region: Secondary | ICD-10-CM

## 2015-06-08 ENCOUNTER — Encounter: Payer: Self-pay | Admitting: Family Medicine

## 2015-06-12 ENCOUNTER — Telehealth: Payer: Self-pay

## 2015-06-12 NOTE — Telephone Encounter (Signed)
Office visit and we'll do labs that day afterwards

## 2015-06-12 NOTE — Telephone Encounter (Signed)
Pt wants to know since her liver enzymes are elevated should she have a fasting lab done or should she have a visit with you to discuss what's next?  Please advise.

## 2015-06-12 NOTE — Telephone Encounter (Signed)
Pt.notified

## 2015-06-15 ENCOUNTER — Ambulatory Visit (INDEPENDENT_AMBULATORY_CARE_PROVIDER_SITE_OTHER): Payer: Medicare Other | Admitting: Family Medicine

## 2015-06-15 ENCOUNTER — Encounter: Payer: Self-pay | Admitting: Family Medicine

## 2015-06-15 VITALS — BP 101/67 | HR 76 | Wt 219.0 lb

## 2015-06-15 DIAGNOSIS — R945 Abnormal results of liver function studies: Principal | ICD-10-CM

## 2015-06-15 DIAGNOSIS — R7989 Other specified abnormal findings of blood chemistry: Secondary | ICD-10-CM

## 2015-06-15 DIAGNOSIS — R799 Abnormal finding of blood chemistry, unspecified: Secondary | ICD-10-CM | POA: Diagnosis not present

## 2015-06-15 NOTE — Progress Notes (Signed)
CC: Donna Sexton is a 65 y.o. female is here for Elevated Hepatic Enzymes   Subjective: HPI:  Follow-up LFTs: She does not drink alcohol cannot remember the last time she used Tylenol since it's been so long ago, probably years ago. She denies any right upper quadrant pain, jaundice, nausea or unintentional weight loss or gain. She's never had elevated liver enzymes in the past. She contact her psychiatrist to see if Depakote could be causing this but she is yet to hear back. She denies any changes to her Depakote or her atorvastatin in the past year. She is extremely worried that she might have liver cancer, liver failure, or pancreatic cancer. She denies fevers or chills nor any abdominal pain whatsoever   Review Of Systems Outlined In HPI  Past Medical History  Diagnosis Date  . HYPERLIPIDEMIA 10/29/2007    Qualifier: Diagnosis of  By: Yetta Barre MD, Bernadene Bell.   . BIPOLAR DISORDER UNSPECIFIED 10/29/2007    Qualifier: Diagnosis of  By: Yetta Barre MD, Bernadene Bell.   Marland Kitchen COMMON MIGRAINE 12/29/2008    Qualifier: Diagnosis of  By: Yetta Barre MD, Bernadene Bell.   . HYPERTENSION, BENIGN ESSENTIAL 10/29/2007    Qualifier: Diagnosis of  By: Yetta Barre MD, Bernadene Bell.   Marland Kitchen GERD 07/28/2009    Qualifier: Diagnosis of  By: Felicity Coyer MD, Raenette Rover OVERACTIVE BLADDER 02/21/2008    Qualifier: Diagnosis of  By: Jonny Ruiz MD, Len Blalock   . OSTEOPENIA 10/30/2007    Qualifier: Diagnosis of  By: Yetta Barre MD, Bernadene Bell.   . ASTHMA NOS W/ACUTE EXACERBATION 01/13/2010    Qualifier: Diagnosis of  By: Yetta Barre MD, Bernadene Bell.   Marland Kitchen SLEEP APNEA 12/31/2009    Qualifier: Diagnosis of  By: Yetta Barre MD, Bernadene Bell.   Marland Kitchen ANEMIA 11/12/2007    Qualifier: Diagnosis of  By: Briscoe Burns CMA, Alvy Beal    . Arthritis   . Anxiety     Past Surgical History  Procedure Laterality Date  . Cholecystectomy    . Abdominal hysterectomy    . Tubal ligation    . Carpal tunnel release    . Breast surgery  1980    implants bilateral  . Parotid endoscopy    . Hernia repair      Family History  Problem Relation Age of Onset  . Heart attack Mother   . Hypertension Mother   . Diabetes Mother   . Heart attack Father   . Hypertension Father   . Alcohol abuse Maternal Uncle     Social History   Social History  . Marital Status: Divorced    Spouse Name: N/A  . Number of Children: N/A  . Years of Education: N/A   Occupational History  . Not on file.   Social History Main Topics  . Smoking status: Never Smoker   . Smokeless tobacco: Never Used  . Alcohol Use: No  . Drug Use: No  . Sexual Activity: Not Currently   Other Topics Concern  . Not on file   Social History Narrative     Objective: BP 101/67 mmHg  Pulse 76  Wt 219 lb (99.338 kg)  Vital signs reviewed. General: Alert and Oriented, No Acute Distress HEENT: Pupils equal, round, reactive to light. Conjunctivae clear.  External ears unremarkable.  Moist mucous membranes. Lungs: Clear and comfortable work of breathing, speaking in full sentences without accessory muscle use. Cardiac: Regular rate and rhythm.  Neuro: CN II-XII grossly intact, gait normal. Extremities: No peripheral edema.  Strong peripheral pulses.  Mental Status: No depression, anxiety, nor agitation. Logical though process. Skin: Warm and dry.  Assessment & Plan: Donna Sexton was seen today for elevated hepatic enzymes.  Diagnoses and all orders for this visit:  Elevated LFTs -     Valproic Acid level -     Hepatic function panel -     Lipase   If enzymes are still elevated and valproic acid was normal next up would be an ultrasound of the abdomen and liver.  25 minutes spent face-to-face during visit today of which at least 50% was counseling or coordinating care regarding: 1. Elevated LFTs      Return in about 4 weeks (around 07/13/2015).

## 2015-06-16 LAB — VALPROIC ACID LEVEL: Valproic Acid Lvl: 75.9 ug/mL (ref 50.0–100.0)

## 2015-06-16 LAB — LIPASE: Lipase: 25 U/L (ref 7–60)

## 2015-06-16 LAB — HEPATIC FUNCTION PANEL
ALBUMIN: 4 g/dL (ref 3.6–5.1)
ALK PHOS: 60 U/L (ref 33–130)
ALT: 23 U/L (ref 6–29)
AST: 20 U/L (ref 10–35)
Bilirubin, Direct: 0.1 mg/dL (ref <=0.2)
Indirect Bilirubin: 0.4 mg/dL (ref 0.2–1.2)
TOTAL PROTEIN: 5.9 g/dL — AB (ref 6.1–8.1)
Total Bilirubin: 0.5 mg/dL (ref 0.2–1.2)

## 2015-06-17 ENCOUNTER — Other Ambulatory Visit: Payer: Self-pay | Admitting: Neurosurgery

## 2015-06-17 ENCOUNTER — Ambulatory Visit
Admission: RE | Admit: 2015-06-17 | Discharge: 2015-06-17 | Disposition: A | Discharge status: Home / Self Care | Payer: Medicare Other | Source: Ambulatory Visit | Attending: Neurosurgery | Admitting: Neurosurgery

## 2015-06-17 DIAGNOSIS — M5136 Other intervertebral disc degeneration, lumbar region: Secondary | ICD-10-CM

## 2015-06-17 DIAGNOSIS — M47817 Spondylosis without myelopathy or radiculopathy, lumbosacral region: Secondary | ICD-10-CM | POA: Diagnosis not present

## 2015-06-17 MED ORDER — IOPAMIDOL (ISOVUE-M 200) INJECTION 41%: 1.0000 mL | Freq: Once | INTRAMUSCULAR | Status: DC

## 2015-06-17 MED ORDER — METHYLPREDNISOLONE ACETATE 40 MG/ML INJ SUSP (RADIOLOG: 120.0000 mg | Freq: Once | INTRAMUSCULAR | Status: DC

## 2015-06-17 MED ORDER — METHYLPREDNISOLONE ACETATE 40 MG/ML INJ SUSP (RADIOLOG
120.0000 mg | Freq: Once | INTRAMUSCULAR | Status: AC
Start: 2015-06-17 — End: 2015-06-17
  Administered 2015-06-17: 120 mg via INTRA_ARTICULAR

## 2015-06-17 NOTE — Discharge Instructions (Signed)

## 2015-06-19 ENCOUNTER — Encounter: Payer: Self-pay | Admitting: Family Medicine

## 2015-06-19 ENCOUNTER — Other Ambulatory Visit: Payer: Self-pay

## 2015-06-19 MED ORDER — HYDROCODONE-ACETAMINOPHEN 5-325 MG PO TABS: 1.0000 | ORAL_TABLET | Freq: Four times a day (QID) | ORAL | Status: DC | PRN

## 2015-06-19 NOTE — Progress Notes (Signed)
Rx was originally written by Dr. Denyse Amassorey (see mychart message). Pt came to pick up before it was signed. Dr. Denyse Amassorey not available to sign. Rewritten under Dr. Roderic Palauhekkekandam. Ok per Dr. Benjamin Stainhekkekandam.

## 2015-06-19 NOTE — Telephone Encounter (Signed)
Forwarding this mychart message to Dr. Denyse Amassorey, he has been seeing [name].

## 2015-06-19 NOTE — Telephone Encounter (Signed)
Approvedon May 3 (checking on status of open encounters in my box) Effective from 05/13/2015 through 05/12/2016 Left message on patients vm

## 2015-06-26 DIAGNOSIS — Z6837 Body mass index (BMI) 37.0-37.9, adult: Secondary | ICD-10-CM | POA: Diagnosis not present

## 2015-06-26 DIAGNOSIS — M5136 Other intervertebral disc degeneration, lumbar region: Secondary | ICD-10-CM | POA: Diagnosis not present

## 2015-06-26 DIAGNOSIS — M4806 Spinal stenosis, lumbar region: Secondary | ICD-10-CM | POA: Diagnosis not present

## 2015-06-26 DIAGNOSIS — M47816 Spondylosis without myelopathy or radiculopathy, lumbar region: Secondary | ICD-10-CM | POA: Diagnosis not present

## 2015-06-26 DIAGNOSIS — M545 Low back pain: Secondary | ICD-10-CM | POA: Diagnosis not present

## 2015-08-07 ENCOUNTER — Ambulatory Visit (INDEPENDENT_AMBULATORY_CARE_PROVIDER_SITE_OTHER): Payer: Medicare Other | Admitting: Family Medicine

## 2015-08-07 ENCOUNTER — Encounter: Payer: Self-pay | Admitting: Family Medicine

## 2015-08-07 VITALS — BP 105/62 | HR 86 | Wt 224.0 lb

## 2015-08-07 DIAGNOSIS — M545 Low back pain, unspecified: Secondary | ICD-10-CM

## 2015-08-07 DIAGNOSIS — K589 Irritable bowel syndrome without diarrhea: Secondary | ICD-10-CM | POA: Diagnosis not present

## 2015-08-07 DIAGNOSIS — G8929 Other chronic pain: Secondary | ICD-10-CM | POA: Diagnosis not present

## 2015-08-07 MED ORDER — GABAPENTIN 300 MG PO CAPS: 300.0000 mg | ORAL_CAPSULE | Freq: Four times a day (QID) | ORAL | 11 refills | Status: DC

## 2015-08-07 MED ORDER — DIPHENOXYLATE-ATROPINE 2.5-0.025 MG PO TABS: 1.0000 | ORAL_TABLET | Freq: Four times a day (QID) | ORAL | 2 refills | Status: DC | PRN

## 2015-08-07 MED ORDER — CELECOXIB 200 MG PO CAPS: 200.0000 mg | ORAL_CAPSULE | Freq: Two times a day (BID) | ORAL | 1 refills | Status: DC

## 2015-08-07 NOTE — Progress Notes (Addendum)
CC: Donna Sexton is a 65 y.o. female is here for Medication Management   Subjective: HPI:  She's heard about my departure in September and would like refills before I leave. She needs refills on gabapentin that she takes for chronic low back pain and restless leg syndrome. She's been evaluated by neurosurgeon since I saw her last and received epidural injections however it did not seem to help. She was told by her surgeon that surgery would not help her low back pain. She tried diclofenac however caused intolerable abdominal pain. She would like to know if there something else to help with inflammation. She denies any new back pain or radiation of her pain.  She's requesting a refill of Lomotil, she uses this for IBS and is satisfied with its response.   Review Of Systems Outlined In HPI  Past Medical History:  Diagnosis Date  . ANEMIA 11/12/2007   Qualifier: Diagnosis of  By: Briscoe Burns CMA, Alvy Beal    . Anxiety   . Arthritis   . ASTHMA NOS W/ACUTE EXACERBATION 01/13/2010   Qualifier: Diagnosis of  By: Yetta Barre MD, Bernadene Bell.   . BIPOLAR DISORDER UNSPECIFIED 10/29/2007   Qualifier: Diagnosis of  By: Yetta Barre MD, Bernadene Bell.   Marland Kitchen COMMON MIGRAINE 12/29/2008   Qualifier: Diagnosis of  By: Yetta Barre MD, Bernadene Bell.   Marland Kitchen GERD 07/28/2009   Qualifier: Diagnosis of  By: Felicity Coyer MD, Raenette Rover HYPERLIPIDEMIA 10/29/2007   Qualifier: Diagnosis of  By: Yetta Barre MD, Bernadene Bell.   . HYPERTENSION, BENIGN ESSENTIAL 10/29/2007   Qualifier: Diagnosis of  By: Yetta Barre MD, Bernadene Bell.   . OSTEOPENIA 10/30/2007   Qualifier: Diagnosis of  By: Yetta Barre MD, Bernadene Bell.   . OVERACTIVE BLADDER 02/21/2008   Qualifier: Diagnosis of  By: Jonny Ruiz MD, Len Blalock   . SLEEP APNEA 12/31/2009   Qualifier: Diagnosis of  By: Yetta Barre MD, Bernadene Bell.     Past Surgical History:  Procedure Laterality Date  . ABDOMINAL HYSTERECTOMY    . BREAST SURGERY  1980   implants bilateral  . CARPAL TUNNEL RELEASE    . CHOLECYSTECTOMY    . HERNIA REPAIR    . PAROTID  ENDOSCOPY    . TUBAL LIGATION     Family History  Problem Relation Age of Onset  . Heart attack Mother   . Hypertension Mother   . Diabetes Mother   . Heart attack Father   . Hypertension Father   . Alcohol abuse Maternal Uncle     Social History   Social History  . Marital status: Divorced    Spouse name: N/A  . Number of children: N/A  . Years of education: N/A   Occupational History  . Not on file.   Social History Main Topics  . Smoking status: Never Smoker  . Smokeless tobacco: Never Used  . Alcohol use No  . Drug use: No  . Sexual activity: Not Currently   Other Topics Concern  . Not on file   Social History Narrative  . No narrative on file     Objective: BP 105/62   Pulse 86   Wt 224 lb (101.6 kg)   BMI 37.28 kg/m   Vital signs reviewed. General: Alert and Oriented, No Acute Distress HEENT: Pupils equal, round, reactive to light. Conjunctivae clear.  External ears unremarkable.  Moist mucous membranes. Lungs: Clear and comfortable work of breathing, speaking in full sentences without accessory muscle use. Cardiac: Regular rate and rhythm.  Neuro:  CN II-XII grossly intact, gait normal. Extremities: No peripheral edema.  Strong peripheral pulses.  Mental Status: No depression, anxiety, nor agitation. Logical though process. Skin: Warm and dry.  Assessment & Plan: Intisar was seen today for medication management.  Diagnoses and all orders for this visit:  Chronic bilateral low back pain without sciatica -     gabapentin (NEURONTIN) 300 MG capsule; Take 1-2 capsules (300-600 mg total) by mouth 4 (four) times daily. -     celecoxib (CELEBREX) 200 MG capsule; Take 1 capsule (200 mg total) by mouth 2 (two) times daily.  IBS (irritable bowel syndrome)  Other orders -     diphenoxylate-atropine (LOMOTIL) 2.5-0.025 MG tablet; Take 1 tablet by mouth 4 (four) times daily as needed for diarrhea or loose stools.   Back pain: Continue gabapentin and trying  Celebrex to help with inflammation. IBS: Controlled with Lomotil. Discussed with this patient that I will be resigning from my position here with [facility] in September in order to stay with my family who will be moving to [facility]. I let him know about the providers that are still accepting patients and I feel that this individual will be under great care if he/she stays here with [facility].  Return if symptoms worsen or fail to improve.

## 2015-08-18 ENCOUNTER — Ambulatory Visit: Payer: Self-pay | Admitting: Family Medicine

## 2015-08-27 DIAGNOSIS — F331 Major depressive disorder, recurrent, moderate: Secondary | ICD-10-CM | POA: Diagnosis not present

## 2015-09-02 ENCOUNTER — Other Ambulatory Visit: Payer: Self-pay | Admitting: Family Medicine

## 2015-09-02 ENCOUNTER — Encounter: Payer: Self-pay | Admitting: Family Medicine

## 2015-09-02 MED ORDER — HYDROCODONE-ACETAMINOPHEN 5-325 MG PO TABS
1.0000 | ORAL_TABLET | Freq: Four times a day (QID) | ORAL | 0 refills | Status: DC | PRN
Start: 2015-09-02 — End: 2015-09-24

## 2015-09-07 ENCOUNTER — Encounter: Payer: Self-pay | Admitting: Family Medicine

## 2015-09-07 ENCOUNTER — Ambulatory Visit (INDEPENDENT_AMBULATORY_CARE_PROVIDER_SITE_OTHER): Payer: Medicare Other | Admitting: Family Medicine

## 2015-09-07 DIAGNOSIS — M5416 Radiculopathy, lumbar region: Secondary | ICD-10-CM | POA: Diagnosis not present

## 2015-09-07 MED ORDER — PREGABALIN 75 MG PO CAPS: 75.0000 mg | ORAL_CAPSULE | Freq: Two times a day (BID) | ORAL | 0 refills | Status: DC

## 2015-09-07 NOTE — Progress Notes (Signed)
CC: Donna Sexton is a 65 y.o. female is here for Back Pain (chronic)   Subjective: HPI:  Patient complains of a change to her back pain. It's no longer just localized at the bottom left and right lower back and now has a component that radiates down the back of the left thigh. It's worse with sitting for long periods of time or bending over. Gabapentin doesn't seem to be helping much. She is currently taking hydrocodone when the pain is unbearable. Symptoms are moderate in severity on average. She denies any tingling numbness or motor or sensory disturbances in her lower extremities nor bowel or bladder incontinence. She denies any recent injury or overexertion.   Review Of Systems Outlined In HPI  Past Medical History:  Diagnosis Date  . ANEMIA 11/12/2007   Qualifier: Diagnosis of  By: Briscoe Burns CMA, Alvy Beal    . Anxiety   . Arthritis   . ASTHMA NOS W/ACUTE EXACERBATION 01/13/2010   Qualifier: Diagnosis of  By: Yetta Barre MD, Bernadene Bell.   . BIPOLAR DISORDER UNSPECIFIED 10/29/2007   Qualifier: Diagnosis of  By: Yetta Barre MD, Bernadene Bell.   Marland Kitchen COMMON MIGRAINE 12/29/2008   Qualifier: Diagnosis of  By: Yetta Barre MD, Bernadene Bell.   Marland Kitchen GERD 07/28/2009   Qualifier: Diagnosis of  By: Felicity Coyer MD, Raenette Rover HYPERLIPIDEMIA 10/29/2007   Qualifier: Diagnosis of  By: Yetta Barre MD, Bernadene Bell.   . HYPERTENSION, BENIGN ESSENTIAL 10/29/2007   Qualifier: Diagnosis of  By: Yetta Barre MD, Bernadene Bell.   . OSTEOPENIA 10/30/2007   Qualifier: Diagnosis of  By: Yetta Barre MD, Bernadene Bell.   . OVERACTIVE BLADDER 02/21/2008   Qualifier: Diagnosis of  By: Jonny Ruiz MD, Len Blalock   . SLEEP APNEA 12/31/2009   Qualifier: Diagnosis of  By: Yetta Barre MD, Bernadene Bell.     Past Surgical History:  Procedure Laterality Date  . ABDOMINAL HYSTERECTOMY    . BREAST SURGERY  1980   implants bilateral  . CARPAL TUNNEL RELEASE    . CHOLECYSTECTOMY    . HERNIA REPAIR    . PAROTID ENDOSCOPY    . TUBAL LIGATION     Family History  Problem Relation Age of Onset  . Heart  attack Mother   . Hypertension Mother   . Diabetes Mother   . Heart attack Father   . Hypertension Father   . Alcohol abuse Maternal Uncle     Social History   Social History  . Marital status: Divorced    Spouse name: N/A  . Number of children: N/A  . Years of education: N/A   Occupational History  . Not on file.   Social History Main Topics  . Smoking status: Never Smoker  . Smokeless tobacco: Never Used  . Alcohol use No  . Drug use: No  . Sexual activity: Not Currently   Other Topics Concern  . Not on file   Social History Narrative  . No narrative on file     Objective: BP 125/78   Pulse 81   Wt 230 lb (104.3 kg)   BMI 38.27 kg/m   General: Alert and Oriented, No Acute Distress HEENT: Pupils equal, round, reactive to light. Conjunctivae clear.  Moist mucous membranes Lungs: Clear to auscultation bilaterally, no wheezing/ronchi/rales.  Comfortable work of breathing. Good air movement. Cardiac: Regular rate and rhythm. Normal S1/S2.  No murmurs, rubs, nor gallops.   Back: No midline spinous process tenderness Extremities: No peripheral edema.  Strong peripheral pulses.  Mental Status:  No depression, anxiety, nor agitation. Skin: Warm and dry.  Assessment & Plan: Donna Sexton was seen today for back pain.  Diagnoses and all orders for this visit:  Lumbar radiculitis  Other orders -     pregabalin (LYRICA) 75 MG capsule; Take 1 capsule (75 mg total) by mouth 2 (two) times daily.  Lumbar radiculitis: Switching from gabapentin to Lyrica. If symptoms persist I recommended that she follow-up with her neurosurgeon since her new symptoms may require consideration of surgical decompression.   Return if symptoms worsen or fail to improve.

## 2015-09-09 DIAGNOSIS — H5213 Myopia, bilateral: Secondary | ICD-10-CM | POA: Diagnosis not present

## 2015-09-09 DIAGNOSIS — F331 Major depressive disorder, recurrent, moderate: Secondary | ICD-10-CM | POA: Diagnosis not present

## 2015-09-10 ENCOUNTER — Encounter: Payer: Self-pay | Admitting: Family Medicine

## 2015-09-11 MED ORDER — CYCLOBENZAPRINE HCL 5 MG PO TABS: 5.0000 mg | ORAL_TABLET | Freq: Every day | ORAL | 3 refills | Status: DC

## 2015-09-21 ENCOUNTER — Ambulatory Visit (INDEPENDENT_AMBULATORY_CARE_PROVIDER_SITE_OTHER): Payer: Medicare Other | Admitting: Osteopathic Medicine

## 2015-09-21 VITALS — BP 108/75 | HR 73 | Temp 98.0°F

## 2015-09-21 DIAGNOSIS — Z23 Encounter for immunization: Secondary | ICD-10-CM

## 2015-09-21 NOTE — Progress Notes (Signed)
Flu vaccine given, no concerns.

## 2015-09-24 ENCOUNTER — Encounter: Payer: Self-pay | Admitting: Osteopathic Medicine

## 2015-09-24 ENCOUNTER — Ambulatory Visit (INDEPENDENT_AMBULATORY_CARE_PROVIDER_SITE_OTHER): Payer: Medicare Other | Admitting: Osteopathic Medicine

## 2015-09-24 VITALS — BP 129/79 | HR 78 | Ht 64.5 in | Wt 229.0 lb

## 2015-09-24 DIAGNOSIS — M5442 Lumbago with sciatica, left side: Secondary | ICD-10-CM | POA: Diagnosis not present

## 2015-09-24 DIAGNOSIS — G8929 Other chronic pain: Secondary | ICD-10-CM

## 2015-09-24 DIAGNOSIS — M5416 Radiculopathy, lumbar region: Secondary | ICD-10-CM | POA: Diagnosis not present

## 2015-09-24 MED ORDER — PREGABALIN 150 MG PO CAPS: 150.0000 mg | ORAL_CAPSULE | Freq: Two times a day (BID) | ORAL | 2 refills | Status: DC

## 2015-09-24 MED ORDER — HYDROCODONE-ACETAMINOPHEN 5-325 MG PO TABS: 1.0000 | ORAL_TABLET | Freq: Two times a day (BID) | ORAL | 0 refills | Status: DC | PRN

## 2015-09-24 MED ORDER — NAPROXEN-ESOMEPRAZOLE 500-20 MG PO TBEC: 1.0000 | DELAYED_RELEASE_TABLET | Freq: Two times a day (BID) | ORAL | 3 refills | Status: DC

## 2015-09-24 NOTE — Progress Notes (Signed)
HPI: Donna Sexton is a 65 y.o. female  who presents to [facility] today, 09/24/15,  for chief complaint of:  Chief Complaint  Patient presents with  . Establish Care    back pain    . Context:previous injections at [facility], helped for few months but then second round of injections didn't help. Neurologist recommended injections again but this didn't help. Was put on antiinflammatory but this was tearing up her stomach.  Dr Ivan Anchors tried her on Lyrica samples that this is not helping much. Patient requests refill of hydrocodone, she takes this twice a day. She is planning on going back to neurologist for further discussion of possible surgical intervention such as nerve ablation or spinal fusion, I don't see any records available in media . Location: Lower back, worsened to left side . Quality: Soreness in the back, shooting/burning pain bilaterally worse into left leg/buttocks . Duration: Ongoing several years  Past medical, surgical, social and family history reviewed: Past Medical History:  Diagnosis Date  . ANEMIA 11/12/2007   Qualifier: Diagnosis of  By: Briscoe Burns CMA, Alvy Beal    . Anxiety   . Arthritis   . ASTHMA NOS W/ACUTE EXACERBATION 01/13/2010   Qualifier: Diagnosis of  By: Yetta Barre MD, Bernadene Bell.   . BIPOLAR DISORDER UNSPECIFIED 10/29/2007   Qualifier: Diagnosis of  By: Yetta Barre MD, Bernadene Bell.   Marland Kitchen COMMON MIGRAINE 12/29/2008   Qualifier: Diagnosis of  By: Yetta Barre MD, Bernadene Bell.   Marland Kitchen GERD 07/28/2009   Qualifier: Diagnosis of  By: Felicity Coyer MD, Raenette Rover HYPERLIPIDEMIA 10/29/2007   Qualifier: Diagnosis of  By: Yetta Barre MD, Bernadene Bell.   . HYPERTENSION, BENIGN ESSENTIAL 10/29/2007   Qualifier: Diagnosis of  By: Yetta Barre MD, Bernadene Bell.   . OSTEOPENIA 10/30/2007   Qualifier: Diagnosis of  By: Yetta Barre MD, Bernadene Bell.   . OVERACTIVE BLADDER 02/21/2008   Qualifier: Diagnosis of  By: Jonny Ruiz MD, Len Blalock   . SLEEP APNEA 12/31/2009   Qualifier: Diagnosis of  By: Yetta Barre  MD, Bernadene Bell.    Past Surgical History:  Procedure Laterality Date  . ABDOMINAL HYSTERECTOMY    . BREAST SURGERY  1980   implants bilateral  . CARPAL TUNNEL RELEASE    . CHOLECYSTECTOMY    . HERNIA REPAIR    . PAROTID ENDOSCOPY    . TUBAL LIGATION     Social History  Substance Use Topics  . Smoking status: Never Smoker  . Smokeless tobacco: Never Used  . Alcohol use No   Family History  Problem Relation Age of Onset  . Heart attack Mother   . Hypertension Mother   . Diabetes Mother   . Heart attack Father   . Hypertension Father   . Alcohol abuse Maternal Uncle      Current medication list and allergy/intolerance information reviewed:   Current Outpatient Prescriptions  Medication Sig Dispense Refill  . amLODipine (NORVASC) 10 MG tablet TAKE 1 TABLET (10 MG TOTAL) BY MOUTH DAILY. 90 tablet 3  . atorvastatin (LIPITOR) 40 MG tablet Take 1 tablet (40 mg total) by mouth daily. 90 tablet 3  . celecoxib (CELEBREX) 200 MG capsule Take 1 capsule (200 mg total) by mouth 2 (two) times daily. 60 capsule 1  . cyclobenzaprine (FLEXERIL) 5 MG tablet Take 1 tablet (5 mg total) by mouth at bedtime. 90 tablet 3  . diphenoxylate-atropine (LOMOTIL) 2.5-0.025 MG tablet Take 1 tablet by mouth 4 (four) times daily as needed  for diarrhea or loose stools. 30 tablet 2  . divalproex (DEPAKOTE) 250 MG DR tablet Take 750 mg by mouth 2 (two) times daily.    Marland Kitchen. HYDROcodone-acetaminophen (NORCO/VICODIN) 5-325 MG tablet Take 1 tablet by mouth every 6 (six) hours as needed for moderate pain or severe pain. 30 tablet 0  . labetalol (NORMODYNE) 200 MG tablet Take 2 tablets in the morning and 1 tablet in the evening. 270 tablet 3  . meloxicam (MOBIC) 15 MG tablet Take 1 tablet (15 mg total) by mouth daily. Patient needs to schedule a follow up appointment before more refills. 30 tablet 0  . oxybutynin (DITROPAN) 5 MG tablet Take 0.5-1 tablets (2.5-5 mg total) by mouth 2 (two) times daily. 180 tablet 3  .  pregabalin (LYRICA) 75 MG capsule Take 1 capsule (75 mg total) by mouth 2 (two) times daily. 50 capsule 0  . promethazine (PHENERGAN) 25 MG tablet TAKE ONE TABLET BY MOUTH EVERY 8 HOURS AS NEEDED FOR NAUSEA AND VOMITING 36 tablet 11  . vitamin E (VITAMIN E) 400 UNIT capsule Take 1 capsule (400 Units total) by mouth daily.     No current facility-administered medications for this visit.    Allergies  Allergen Reactions  . Morphine Nausea And Vomiting      Review of Systems:  Constitutional:  No  fever, no chills, No recent illness  HEENT: No  headache,  Cardiac: No  chest pain  Respiratory:  No  shortness of breath.  Gastrointestinal: No  abdominal pain  Musculoskeletal: No new myalgia/arthralgia  Exam:  BP 129/79   Pulse 78   Ht 5' 4.5" (1.638 m)   Wt 229 lb (103.9 kg)   BMI 38.70 kg/m   Constitutional: VS see above. General Appearance: alert, well-developed, well-nourished, NAD  Ears, Nose, Mouth, Throat: MMM  Neck: No masses, trachea midline.   Respiratory: Normal respiratory effort. no wheeze, no rhonchi, no rales  Cardiovascular: S1/S2 normal, no murmur, no rub/gallop auscultated. RRR.    Musculoskeletal: Gait normal. No clubbing/cyanosis of digits. Positive straight leg raise on left. Paraspinal lower back tenderness bilaterally  Neurological:  Normal balance/coordination. No tremor.   Skin: warm, dry, intact.   Psychiatric: Normal judgment/insight. Normal mood and affect. Oriented x3.     ASSESSMENT/PLAN:   Chronic bilateral low back pain with left-sided sciatica - Plan: HYDROcodone-acetaminophen (NORCO/VICODIN) 5-325 MG tablet, pregabalin (LYRICA) 150 MG capsule, Naproxen-Esomeprazole 500-20 MG TBEC  Lumbar radiculitis - Plan: HYDROcodone-acetaminophen (NORCO/VICODIN) 5-325 MG tablet, pregabalin (LYRICA) 150 MG capsule   Patient Instructions  What we did today:  Refilled hydrocodone pain medication, use no more than twice per day  Increased dose  of Lyrica, this should hopefully help with burning/nerve pain. If you received samples last time, prescription through your pharmacy may require prior authorization with your insurance  Try new medication, Vimovo, this is an anti-inflammatory with an antacid/GI protectant - this may require prior authorization through your insurance, this may not be a medication that he will be able to pick up at your pharmacy right away.  What to do next:  When you go to see her other specialists, please make sure that they are sending us records  Plan to follow-up with Dr. Lyn HollingsheadAlexander for annual physical exam when due, sooner if needed or if any new problems arise    Visit summary with medication list and pertinent instructions was printed for patient to review. All questions at time of visit were answered - patient instructed to contact office with  any additional concerns. ER/RTC precautions were reviewed with the patient. Follow-up plan: Return if symptoms worsen or fail to improve, for annual physical exam when due.  Note: Total time spent 25 minutes, greater than 50% of the visit was spent face-to-face counseling and coordinating care for the following: The primary encounter diagnosis was Chronic bilateral low back pain with left-sided sciatica. A diagnosis of Lumbar radiculitis was also pertinent to this visit.

## 2015-09-24 NOTE — Patient Instructions (Addendum)
What we did today:  Refilled hydrocodone pain medication, use no more than twice per day  Increased dose of Lyrica, this should hopefully help with burning/nerve pain. If you received samples last time, prescription through your pharmacy may require prior authorization with your insurance  Try new medication, Vimovo, this is an anti-inflammatory with an antacid/GI protectant - this may require prior authorization through your insurance, this may not be a medication that he will be able to pick up at your pharmacy right away.  What to do next:  When you go to see her other specialists, please make sure that they are sending us records  Plan to follow-up with Dr. Lyn HollingsheadAlexander for annual physical exam when due, sooner if needed or if any new problems arise

## 2015-09-25 DIAGNOSIS — M47816 Spondylosis without myelopathy or radiculopathy, lumbar region: Secondary | ICD-10-CM | POA: Diagnosis not present

## 2015-09-25 DIAGNOSIS — M5126 Other intervertebral disc displacement, lumbar region: Secondary | ICD-10-CM | POA: Diagnosis not present

## 2015-09-25 DIAGNOSIS — M5416 Radiculopathy, lumbar region: Secondary | ICD-10-CM | POA: Diagnosis not present

## 2015-09-25 DIAGNOSIS — M5136 Other intervertebral disc degeneration, lumbar region: Secondary | ICD-10-CM | POA: Diagnosis not present

## 2015-10-01 DIAGNOSIS — F331 Major depressive disorder, recurrent, moderate: Secondary | ICD-10-CM | POA: Diagnosis not present

## 2015-10-02 ENCOUNTER — Telehealth: Payer: Self-pay | Admitting: *Deleted

## 2015-10-02 ENCOUNTER — Ambulatory Visit (INDEPENDENT_AMBULATORY_CARE_PROVIDER_SITE_OTHER): Payer: Medicare Other | Admitting: Osteopathic Medicine

## 2015-10-02 VITALS — BP 120/71 | HR 72 | Wt 227.0 lb

## 2015-10-02 DIAGNOSIS — K121 Other forms of stomatitis: Secondary | ICD-10-CM

## 2015-10-02 MED ORDER — MAGIC MOUTHWASH W/LIDOCAINE: 10.0000 mL | Freq: Four times a day (QID) | ORAL | 1 refills | Status: DC | PRN

## 2015-10-02 NOTE — Telephone Encounter (Signed)
PA initiated for vimovo through covermymed  Key: [id number]

## 2015-10-02 NOTE — Progress Notes (Signed)
HPI: Donna Sexton is a 64 y.o. female  who presents to [facility] [place] today, 10/02/15,  for chief complaint of:  Chief Complaint  Patient presents with  . Mouth Lesions    3 weeks  . Medication Management     . Location: started on L - small bump but the side felt like it was rubbing or tearing. Then mouth started "breaking out. " Canker sore on lower lip and left cheek. . Quality: Consistent with canker sore . Duration: 3 weeks ago  . Modifying factors: Has tried changing toothpaste, Kanka OTC (Benzocaine and Zinc), Peroxide, Orajel (Peroxide and menthol). Has had a similar problem in the past and was prescribed Magic mouthwash which helped . Assoc signs/symptoms: No fever or chills, no rash   Past medical, surgical, social and family history reviewed: Past Medical History:  Diagnosis Date  . ANEMIA 11/12/2007   Qualifier: Diagnosis of  By: Briscoe Burns CMA, Alvy Beal    . Anxiety   . Arthritis   . ASTHMA NOS W/ACUTE EXACERBATION 01/13/2010   Qualifier: Diagnosis of  By: Yetta Barre MD, Bernadene Bell.   . BIPOLAR DISORDER UNSPECIFIED 10/29/2007   Qualifier: Diagnosis of  By: Yetta Barre MD, Bernadene Bell.   Marland Kitchen COMMON MIGRAINE 12/29/2008   Qualifier: Diagnosis of  By: Yetta Barre MD, Bernadene Bell.   Marland Kitchen GERD 07/28/2009   Qualifier: Diagnosis of  By: Felicity Coyer MD, Raenette Rover HYPERLIPIDEMIA 10/29/2007   Qualifier: Diagnosis of  By: Yetta Barre MD, Bernadene Bell.   . HYPERTENSION, BENIGN ESSENTIAL 10/29/2007   Qualifier: Diagnosis of  By: Yetta Barre MD, Bernadene Bell.   . OSTEOPENIA 10/30/2007   Qualifier: Diagnosis of  By: Yetta Barre MD, Bernadene Bell.   . OVERACTIVE BLADDER 02/21/2008   Qualifier: Diagnosis of  By: Jonny Ruiz MD, Len Blalock   . SLEEP APNEA 12/31/2009   Qualifier: Diagnosis of  By: Yetta Barre MD, Bernadene Bell.    Past Surgical History:  Procedure Laterality Date  . ABDOMINAL HYSTERECTOMY    . BREAST SURGERY  1980   implants bilateral  . CARPAL TUNNEL RELEASE    . CHOLECYSTECTOMY    . HERNIA REPAIR    .  PAROTID ENDOSCOPY    . TUBAL LIGATION     Social History  Substance Use Topics  . Smoking status: Never Smoker  . Smokeless tobacco: Never Used  . Alcohol use No   Family History  Problem Relation Age of Onset  . Heart attack Mother   . Hypertension Mother   . Diabetes Mother   . Heart attack Father   . Hypertension Father   . Alcohol abuse Maternal Uncle      Current medication list and allergy/intolerance information reviewed:   Current Outpatient Prescriptions  Medication Sig Dispense Refill  . amLODipine (NORVASC) 10 MG tablet TAKE 1 TABLET (10 MG TOTAL) BY MOUTH DAILY. 90 tablet 3  . atorvastatin (LIPITOR) 40 MG tablet Take 1 tablet (40 mg total) by mouth daily. 90 tablet 3  . cyclobenzaprine (FLEXERIL) 5 MG tablet Take 1 tablet (5 mg total) by mouth at bedtime. 90 tablet 3  . diphenoxylate-atropine (LOMOTIL) 2.5-0.025 MG tablet Take 1 tablet by mouth 4 (four) times daily as needed for diarrhea or loose stools. 30 tablet 2  . divalproex (DEPAKOTE) 250 MG DR tablet Take 750 mg by mouth 2 (two) times daily.    Marland Kitchen HYDROcodone-acetaminophen (NORCO/VICODIN) 5-325 MG tablet Take 1 tablet by mouth 2 (two) times daily as needed for moderate pain or  severe pain. 60 tablet 0  . labetalol (NORMODYNE) 200 MG tablet Take 2 tablets in the morning and 1 tablet in the evening. 270 tablet 3  . Naproxen-Esomeprazole 500-20 MG TBEC Take 1 tablet by mouth 2 (two) times daily. 60 tablet 3  . oxybutynin (DITROPAN) 5 MG tablet Take 0.5-1 tablets (2.5-5 mg total) by mouth 2 (two) times daily. 180 tablet 3  . pregabalin (LYRICA) 150 MG capsule Take 1 capsule (150 mg total) by mouth 2 (two) times daily. 60 capsule 2  . promethazine (PHENERGAN) 25 MG tablet TAKE ONE TABLET BY MOUTH EVERY 8 HOURS AS NEEDED FOR NAUSEA AND VOMITING 36 tablet 11  . vitamin E (VITAMIN E) 400 UNIT capsule Take 1 capsule (400 Units total) by mouth daily.     No current facility-administered medications for this visit.     Allergies  Allergen Reactions  . Morphine Nausea And Vomiting      Review of Systems:  Constitutional:  No  fever, no chills, No recent illness   HEENT: No  headache, no vision change  Cardiac: No  chest pain  Respiratory:  No  shortness of breath  Gastrointestinal: No  abdominal pain, No  nausea,    Musculoskeletal: No new myalgia/arthralgia   Exam:  BP 120/71   Pulse 72   Wt 227 lb (103 kg)   BMI 38.36 kg/m   Constitutional: VS see above. General Appearance: alert, well-developed, well-nourished, NAD  Eyes: Normal lids and conjunctive, non-icteric sclera  Ears, Nose, Mouth, Throat: MMM, superficial canker sore on mucous membranes of right lower lip, left cheek, no drainage or significant erythema/edema., Normal external inspection ears/nares/mouth/lips/gums.  Pharynx/tonsils no erythema, no exudate.   Neck: No masses, trachea midline.   Respiratory: Normal respiratory effort.   Skin: warm, dry, intact.      ASSESSMENT/PLAN:   Mouth ulceration - Declined cauterization with silver nitrate. Patient requested magic mouthwash, if this does not resolve may consider cauterization/referral - Plan: magic mouthwash w/lidocaine SOLN     All questions at time of visit were answered - patient instructed to contact office with any additional concerns. ER/RTC precautions were reviewed with the patient. Follow-up plan: Return in about 6 months (around 03/31/2016), or sooner if needed, for routine check up .

## 2015-10-08 DIAGNOSIS — M5136 Other intervertebral disc degeneration, lumbar region: Secondary | ICD-10-CM | POA: Diagnosis not present

## 2015-10-08 DIAGNOSIS — M4806 Spinal stenosis, lumbar region: Secondary | ICD-10-CM | POA: Diagnosis not present

## 2015-10-08 DIAGNOSIS — M4726 Other spondylosis with radiculopathy, lumbar region: Secondary | ICD-10-CM | POA: Diagnosis not present

## 2015-10-08 NOTE — Telephone Encounter (Signed)
vimovo has been approved by insurance. Authorization for the requested drug will end on 9-22 2018.  Left message on patient vm and pharm vm

## 2015-10-22 ENCOUNTER — Other Ambulatory Visit: Payer: Self-pay | Admitting: Osteopathic Medicine

## 2015-10-22 DIAGNOSIS — G8929 Other chronic pain: Secondary | ICD-10-CM

## 2015-10-22 DIAGNOSIS — M5442 Lumbago with sciatica, left side: Secondary | ICD-10-CM

## 2015-10-22 DIAGNOSIS — M5416 Radiculopathy, lumbar region: Secondary | ICD-10-CM

## 2015-10-23 ENCOUNTER — Other Ambulatory Visit: Payer: Self-pay | Admitting: Osteopathic Medicine

## 2015-10-23 DIAGNOSIS — M5442 Lumbago with sciatica, left side: Secondary | ICD-10-CM

## 2015-10-23 DIAGNOSIS — G8929 Other chronic pain: Secondary | ICD-10-CM

## 2015-10-23 DIAGNOSIS — M5416 Radiculopathy, lumbar region: Secondary | ICD-10-CM

## 2015-10-26 MED ORDER — HYDROCODONE-ACETAMINOPHEN 5-325 MG PO TABS: 1.0000 | ORAL_TABLET | Freq: Two times a day (BID) | ORAL | 0 refills | Status: DC | PRN

## 2015-10-26 NOTE — Telephone Encounter (Signed)
This is a long-term medication. Can refill for now. It is our policy such that we need to bring her back ASAP for pain contract? Patient was last instructed to follow-up when due for annual physical, I don't see that she's had an annual physical anyway in the past year, so she would like to come in and get that done sooner than later, but I'm okay to refill the pain medication for now. Prescription will be printed signed and waiting up front

## 2015-10-26 NOTE — Telephone Encounter (Signed)
Is this medication long term. If so we will need to get a contract. Please advise.

## 2015-10-27 DIAGNOSIS — F331 Major depressive disorder, recurrent, moderate: Secondary | ICD-10-CM | POA: Diagnosis not present

## 2015-10-27 NOTE — Telephone Encounter (Signed)
Patient has picked up prescription. Left message advising of other recommendations.

## 2015-11-03 ENCOUNTER — Other Ambulatory Visit: Payer: Self-pay | Admitting: Osteopathic Medicine

## 2015-11-03 DIAGNOSIS — Z1231 Encounter for screening mammogram for malignant neoplasm of breast: Secondary | ICD-10-CM

## 2015-11-24 ENCOUNTER — Ambulatory Visit (INDEPENDENT_AMBULATORY_CARE_PROVIDER_SITE_OTHER): Payer: Medicare Other | Admitting: Osteopathic Medicine

## 2015-11-24 ENCOUNTER — Encounter: Payer: Self-pay | Admitting: Osteopathic Medicine

## 2015-11-24 VITALS — BP 131/82 | HR 69 | Ht 64.0 in | Wt 227.0 lb

## 2015-11-24 DIAGNOSIS — Z131 Encounter for screening for diabetes mellitus: Secondary | ICD-10-CM

## 2015-11-24 DIAGNOSIS — M5442 Lumbago with sciatica, left side: Secondary | ICD-10-CM

## 2015-11-24 DIAGNOSIS — M5416 Radiculopathy, lumbar region: Secondary | ICD-10-CM | POA: Diagnosis not present

## 2015-11-24 DIAGNOSIS — I10 Essential (primary) hypertension: Secondary | ICD-10-CM

## 2015-11-24 DIAGNOSIS — Z0000 Encounter for general adult medical examination without abnormal findings: Secondary | ICD-10-CM

## 2015-11-24 DIAGNOSIS — G8929 Other chronic pain: Secondary | ICD-10-CM | POA: Diagnosis not present

## 2015-11-24 MED ORDER — HYDROCODONE-ACETAMINOPHEN 5-325 MG PO TABS: 1.0000 | ORAL_TABLET | Freq: Two times a day (BID) | ORAL | 0 refills | Status: DC | PRN

## 2015-11-24 MED ORDER — PREGABALIN 150 MG PO CAPS: 150.0000 mg | ORAL_CAPSULE | Freq: Two times a day (BID) | ORAL | 2 refills | Status: DC

## 2015-11-24 NOTE — Progress Notes (Signed)
HPI: Donna Sexton is a 65 y.o. female  who [facility] today, 11/24/15,  for chief complaint of:  Chief Complaint  Patient presents with  . Follow-up     Patient here for refill on hydrocodone/Lyrica. Doing well on current medication treatment for chronic pain. No controlled substance agreement on file for the patient, this agreement/contract was reviewed in detail with the patient, see scanned documents/letters.    Past medical, surgical, social and family history reviewed: Patient Active Problem List   Diagnosis Date Noted  . Lumbar radiculitis 09/07/2015  . IBS (irritable bowel syndrome) 08/07/2015  . Elevated LFTs 05/18/2015  . Hyperglycemia 05/12/2015  . Lumbago 11/05/2014  . Basal cell papilloma 09/11/2014  . Dyspnea 09/04/2014  . Nausea without vomiting 04/24/2014  . Restless leg 04/16/2014  . Muscle spasm 02/27/2014  . Overactive bladder 04/09/2013  . Abnormal mammogram 06/22/2012  . Bipolar disorder (HCC) 09/29/2011  . Hyperlipidemia 09/29/2011  . Essential hypertension 09/29/2011  . Obesity (BMI 30.0-34.9) 09/26/2011  . Telogen effluvium 08/16/2011  . Calcium blood increased 01/11/2011  . Anxiety state 10/04/2010  . Clinical depression 10/04/2010  . Anal bleeding 10/04/2010  . Cannot sleep 10/04/2010  . GERD 07/28/2009  . COMMON MIGRAINE 12/29/2008  . OVERACTIVE BLADDER 02/21/2008  . OSTEOPENIA 10/30/2007   Past Surgical History:  Procedure Laterality Date  . ABDOMINAL HYSTERECTOMY    . BREAST SURGERY  1980   implants bilateral  . CARPAL TUNNEL RELEASE    . CHOLECYSTECTOMY    . HERNIA REPAIR    . PAROTID ENDOSCOPY    . TUBAL LIGATION     Social History  Substance Use Topics  . Smoking status: Never Smoker  . Smokeless tobacco: Never Used  . Alcohol use No   Family History  Problem Relation Age of Onset  . Heart attack Mother   . Hypertension Mother   . Diabetes Mother   . Heart attack Father    . Hypertension Father   . Alcohol abuse Maternal Uncle      Current medication list and allergy/intolerance information reviewed:   Current Outpatient Prescriptions on File Prior to Visit  Medication Sig Dispense Refill  . amLODipine (NORVASC) 10 MG tablet TAKE 1 TABLET (10 MG TOTAL) BY MOUTH DAILY. 90 tablet 3  . atorvastatin (LIPITOR) 40 MG tablet Take 1 tablet (40 mg total) by mouth daily. 90 tablet 3  . cyclobenzaprine (FLEXERIL) 5 MG tablet Take 1 tablet (5 mg total) by mouth at bedtime. 90 tablet 3  . diphenoxylate-atropine (LOMOTIL) 2.5-0.025 MG tablet Take 1 tablet by mouth 4 (four) times daily as needed for diarrhea or loose stools. 30 tablet 2  . divalproex (DEPAKOTE) 250 MG DR tablet Take 750 mg by mouth 2 (two) times daily.    Marland Kitchen. HYDROcodone-acetaminophen (NORCO/VICODIN) 5-325 MG tablet Take 1 tablet by mouth 2 (two) times daily as needed for moderate pain or severe pain. 60 tablet 0  . labetalol (NORMODYNE) 200 MG tablet Take 2 tablets in the morning and 1 tablet in the evening. 270 tablet 3  . magic mouthwash w/lidocaine SOLN Take 10 mLs by mouth 4 (four) times daily as needed for mouth pain. Swish for 2 minutes then spit out. 500 mL 1  . Naproxen-Esomeprazole 500-20 MG TBEC Take 1 tablet by mouth 2 (two) times daily. 60 tablet 3  . oxybutynin (DITROPAN) 5 MG tablet Take 0.5-1 tablets (2.5-5 mg total) by mouth 2 (two) times daily. 180 tablet 3  .  pregabalin (LYRICA) 150 MG capsule Take 1 capsule (150 mg total) by mouth 2 (two) times daily. 60 capsule 2  . promethazine (PHENERGAN) 25 MG tablet TAKE ONE TABLET BY MOUTH EVERY 8 HOURS AS NEEDED FOR NAUSEA AND VOMITING 36 tablet 11  . vitamin E (VITAMIN E) 400 UNIT capsule Take 1 capsule (400 Units total) by mouth daily.     No current facility-administered medications on file prior to visit.    Allergies  Allergen Reactions  . Morphine Nausea And Vomiting      Review of Systems:  Constitutional: No recent illness  Cardiac:  No  chest pain, No  pressure  Respiratory:  No  shortness of breath.  Gastrointestinal: No  abdominal pain  Musculoskeletal: No new myalgia/arthralgia  Exam:  BP 131/82   Pulse 69   Ht 5\' 4"  (1.626 m)   Wt 227 lb (103 kg)   BMI 38.96 kg/m   Constitutional: VS see above. General Appearance: alert, well-developed, well-nourished, NAD  Eyes: Normal lids and conjunctive, non-icteric sclera  Ears, Nose, Mouth, Throat: MMM, Normal external inspection ears/nares/mouth/lips/gums.  Neck: No masses, trachea midline.   Respiratory: Normal respiratory effort.   Musculoskeletal: Gait normal. Symmetric and independent movement of all extremities  Neurological: Normal balance/coordination. No tremor.  Skin: warm, dry, intact.   Psychiatric: Normal judgment/insight. Normal mood and affect. Oriented x3.      ASSESSMENT/PLAN: Postdated prescriptions for hydrocodone provided to the patient, continue current dose of Lyrica. Labs ordered for upcoming annual physical, this physical was not billed today.  Chronic bilateral low back pain with left-sided sciatica - Plan: pregabalin (LYRICA) 150 MG capsule, HYDROcodone-acetaminophen (NORCO/VICODIN) 5-325 MG tablet, HYDROcodone-acetaminophen (NORCO/VICODIN) 5-325 MG tablet, DISCONTINUED: HYDROcodone-acetaminophen (NORCO/VICODIN) 5-325 MG tablet  Lumbar radiculitis - Plan: pregabalin (LYRICA) 150 MG capsule, HYDROcodone-acetaminophen (NORCO/VICODIN) 5-325 MG tablet, HYDROcodone-acetaminophen (NORCO/VICODIN) 5-325 MG tablet, DISCONTINUED: HYDROcodone-acetaminophen (NORCO/VICODIN) 5-325 MG tablet  Annual physical exam - Plan: CBC with Differential/Platelet, COMPLETE METABOLIC PANEL WITH GFR, Hemoglobin A1c, TSH, Lipid panel  Essential hypertension - Plan: CBC with Differential/Platelet, COMPLETE METABOLIC PANEL WITH GFR, Hemoglobin A1c, TSH, Lipid panel  Diabetes mellitus screening - Plan: Hemoglobin A1c    Visit summary with medication list and  pertinent instructions was printed for patient to review. All questions at time of visit were answered - patient instructed to contact office with any additional concerns. ER/RTC precautions were reviewed with the patient. Follow-up plan: Return in about 3 months (around 02/24/2016) for ANNUAL PHYSICAL - labs the week prior to your appointment .

## 2015-11-25 ENCOUNTER — Ambulatory Visit: Payer: Self-pay

## 2015-11-25 ENCOUNTER — Ambulatory Visit: Payer: Self-pay | Admitting: Osteopathic Medicine

## 2015-11-27 DIAGNOSIS — M5126 Other intervertebral disc displacement, lumbar region: Secondary | ICD-10-CM | POA: Diagnosis not present

## 2015-11-27 DIAGNOSIS — M5136 Other intervertebral disc degeneration, lumbar region: Secondary | ICD-10-CM | POA: Diagnosis not present

## 2015-11-27 DIAGNOSIS — M5416 Radiculopathy, lumbar region: Secondary | ICD-10-CM | POA: Diagnosis not present

## 2015-11-27 DIAGNOSIS — M48062 Spinal stenosis, lumbar region with neurogenic claudication: Secondary | ICD-10-CM | POA: Diagnosis not present

## 2015-11-27 DIAGNOSIS — M47816 Spondylosis without myelopathy or radiculopathy, lumbar region: Secondary | ICD-10-CM | POA: Diagnosis not present

## 2016-01-15 ENCOUNTER — Ambulatory Visit: Payer: Self-pay

## 2016-02-03 ENCOUNTER — Other Ambulatory Visit: Payer: Self-pay

## 2016-02-03 DIAGNOSIS — Z131 Encounter for screening for diabetes mellitus: Secondary | ICD-10-CM

## 2016-02-03 DIAGNOSIS — I10 Essential (primary) hypertension: Secondary | ICD-10-CM | POA: Diagnosis not present

## 2016-02-03 DIAGNOSIS — Z0000 Encounter for general adult medical examination without abnormal findings: Secondary | ICD-10-CM | POA: Diagnosis not present

## 2016-02-03 LAB — COMPLETE METABOLIC PANEL WITH GFR
ALBUMIN: 4 g/dL (ref 3.6–5.1)
ALK PHOS: 52 U/L (ref 33–130)
ALT: 19 U/L (ref 6–29)
AST: 21 U/L (ref 10–35)
BILIRUBIN TOTAL: 1 mg/dL (ref 0.2–1.2)
BUN: 10 mg/dL (ref 7–25)
CALCIUM: 9.7 mg/dL (ref 8.6–10.4)
CO2: 27 mmol/L (ref 20–31)
Chloride: 107 mmol/L (ref 98–110)
Creat: 0.66 mg/dL (ref 0.50–0.99)
Glucose, Bld: 100 mg/dL — ABNORMAL HIGH (ref 65–99)
Potassium: 4.3 mmol/L (ref 3.5–5.3)
Sodium: 143 mmol/L (ref 135–146)
TOTAL PROTEIN: 6.2 g/dL (ref 6.1–8.1)

## 2016-02-03 LAB — CBC WITH DIFFERENTIAL/PLATELET
BASOS ABS: 49 {cells}/uL (ref 0–200)
Basophils Relative: 1 %
EOS ABS: 245 {cells}/uL (ref 15–500)
Eosinophils Relative: 5 %
HCT: 38 % (ref 35.0–45.0)
HEMOGLOBIN: 12.7 g/dL (ref 11.7–15.5)
LYMPHS ABS: 1470 {cells}/uL (ref 850–3900)
Lymphocytes Relative: 30 %
MCH: 31.8 pg (ref 27.0–33.0)
MCHC: 33.4 g/dL (ref 32.0–36.0)
MCV: 95.2 fL (ref 80.0–100.0)
MONOS PCT: 13 %
MPV: 10.9 fL (ref 7.5–12.5)
Monocytes Absolute: 637 cells/uL (ref 200–950)
NEUTROS ABS: 2499 {cells}/uL (ref 1500–7800)
Neutrophils Relative %: 51 %
Platelets: 182 10*3/uL (ref 140–400)
RBC: 3.99 MIL/uL (ref 3.80–5.10)
RDW: 12.7 % (ref 11.0–15.0)
WBC: 4.9 10*3/uL (ref 3.8–10.8)

## 2016-02-03 LAB — TSH: TSH: 1.88 m[IU]/L

## 2016-02-03 LAB — LIPID PANEL
CHOL/HDL RATIO: 2.8 ratio (ref <5.0)
Cholesterol: 157 mg/dL (ref <200)
HDL: 56 mg/dL (ref >50)
LDL Cholesterol: 73 mg/dL (ref <100)
Triglycerides: 142 mg/dL (ref <150)
VLDL: 28 mg/dL (ref <30)

## 2016-02-04 LAB — HEMOGLOBIN A1C
Hgb A1c MFr Bld: 5.3 % (ref <5.7)
MEAN PLASMA GLUCOSE: 105 mg/dL

## 2016-02-05 ENCOUNTER — Encounter: Payer: Self-pay | Admitting: Osteopathic Medicine

## 2016-02-05 ENCOUNTER — Ambulatory Visit (INDEPENDENT_AMBULATORY_CARE_PROVIDER_SITE_OTHER): Payer: Medicare Other | Admitting: Osteopathic Medicine

## 2016-02-05 VITALS — BP 135/85 | HR 70 | Ht 64.5 in | Wt 220.0 lb

## 2016-02-05 DIAGNOSIS — M5416 Radiculopathy, lumbar region: Secondary | ICD-10-CM | POA: Diagnosis not present

## 2016-02-05 DIAGNOSIS — Z0000 Encounter for general adult medical examination without abnormal findings: Secondary | ICD-10-CM

## 2016-02-05 DIAGNOSIS — M5442 Lumbago with sciatica, left side: Secondary | ICD-10-CM | POA: Diagnosis not present

## 2016-02-05 DIAGNOSIS — G8929 Other chronic pain: Secondary | ICD-10-CM | POA: Diagnosis not present

## 2016-02-05 MED ORDER — HYDROCODONE-ACETAMINOPHEN 5-325 MG PO TABS: 1.0000 | ORAL_TABLET | Freq: Two times a day (BID) | ORAL | 0 refills | Status: DC | PRN

## 2016-02-05 NOTE — Progress Notes (Signed)
HPI: Donna Sexton is a 66 y.o. female  who presents to [facility] today, 02/05/16,  for chief complaint of:  Chief Complaint  Patient presents with  . Annual Exam    Patient here for annual physical. See below for review of preventive care. Lab results reviewed in detail with the patient, no major concerns and all questions were answered.  Chronic pain: Patient requests refill of medications, controlled substance agreement is in place. The condition is stable.  Past medical, surgical, social and family history reviewed: Patient Active Problem List   Diagnosis Date Noted  . Lumbar radiculitis 09/07/2015  . IBS (irritable bowel syndrome) 08/07/2015  . Elevated LFTs 05/18/2015  . Hyperglycemia 05/12/2015  . Lumbago 11/05/2014  . Basal cell papilloma 09/11/2014  . Dyspnea 09/04/2014  . Nausea without vomiting 04/24/2014  . Restless leg 04/16/2014  . Muscle spasm 02/27/2014  . Overactive bladder 04/09/2013  . Abnormal mammogram 06/22/2012  . Bipolar disorder ([place]) 09/29/2011  . Hyperlipidemia 09/29/2011  . Essential hypertension 09/29/2011  . Obesity (BMI 30.0-34.9) 09/26/2011  . Telogen effluvium 08/16/2011  . Calcium blood increased 01/11/2011  . Anxiety state 10/04/2010  . Clinical depression 10/04/2010  . Anal bleeding 10/04/2010  . Cannot sleep 10/04/2010  . GERD 07/28/2009  . COMMON MIGRAINE 12/29/2008  . OVERACTIVE BLADDER 02/21/2008  . OSTEOPENIA 10/30/2007   Past Surgical History:  Procedure Laterality Date  . ABDOMINAL HYSTERECTOMY    . BREAST SURGERY  1980   implants bilateral  . CARPAL TUNNEL RELEASE    . CHOLECYSTECTOMY    . HERNIA REPAIR    . PAROTID ENDOSCOPY    . TUBAL LIGATION     Social History  Substance Use Topics  . Smoking status: Never Smoker  . Smokeless tobacco: Never Used  . Alcohol use No   Family History  Problem Relation Age of Onset  . Heart attack Mother   . Hypertension Mother   .  Diabetes Mother   . Heart attack Father   . Hypertension Father   . Alcohol abuse Maternal Uncle      Current medication list and allergy/intolerance information reviewed:   Current Outpatient Prescriptions  Medication Sig Dispense Refill  . amLODipine (NORVASC) 10 MG tablet TAKE 1 TABLET (10 MG TOTAL) BY MOUTH DAILY. 90 tablet 3  . atorvastatin (LIPITOR) 40 MG tablet Take 1 tablet (40 mg total) by mouth daily. 90 tablet 3  . cyclobenzaprine (FLEXERIL) 5 MG tablet Take 1 tablet (5 mg total) by mouth at bedtime. 90 tablet 3  . diphenoxylate-atropine (LOMOTIL) 2.5-0.025 MG tablet Take 1 tablet by mouth 4 (four) times daily as needed for diarrhea or loose stools. 30 tablet 2  . divalproex (DEPAKOTE) 250 MG DR tablet Take 750 mg by mouth 2 (two) times daily.    Marland Kitchen HYDROcodone-acetaminophen (NORCO/VICODIN) 5-325 MG tablet Take 1 tablet by mouth 2 (two) times daily as needed for moderate pain or severe pain. 60 tablet 0  . labetalol (NORMODYNE) 200 MG tablet Take 2 tablets in the morning and 1 tablet in the evening. 270 tablet 3  . Naproxen-Esomeprazole 500-20 MG TBEC Take 1 tablet by mouth 2 (two) times daily. 60 tablet 3  . oxybutynin (DITROPAN) 5 MG tablet Take 0.5-1 tablets (2.5-5 mg total) by mouth 2 (two) times daily. 180 tablet 3  . pregabalin (LYRICA) 150 MG capsule Take 1 capsule (150 mg total) by mouth 2 (two) times daily. 60 capsule 2  . promethazine (PHENERGAN) 25  MG tablet TAKE ONE TABLET BY MOUTH EVERY 8 HOURS AS NEEDED FOR NAUSEA AND VOMITING 36 tablet 11  . vitamin E (VITAMIN E) 400 UNIT capsule Take 1 capsule (400 Units total) by mouth daily.    Marland Kitchen gabapentin (NEURONTIN) 300 MG capsule   10   No current facility-administered medications for this visit.    Allergies  Allergen Reactions  . Morphine Nausea And Vomiting      Review of Systems:  Constitutional:  No  fever, no chills, No recent illness,   HEENT: No  headache  Cardiac: No  chest pain, No  pressure, No  palpitations, No  Orthopnea  Respiratory:  No  shortness of breath. No  Cough  Gastrointestinal: No  abdominal pain, No  nausea, No  vomiting,  No  blood in stool, No  diarrhea, No  constipation   Musculoskeletal: No new myalgia/arthralgia  Skin: No  Rash, No other wounds/concerning lesions  Neurologic: No  weakness, No  dizziness,  Psychiatric: No  concerns with depression, No  concerns with anxiety, No sleep problems, No mood problems  Exam:  BP 135/85   Pulse 70   Ht 5' 4.5" (1.638 m)   Wt 220 lb (99.8 kg)   BMI 37.18 kg/m   Constitutional: VS see above. General Appearance: alert, well-developed, well-nourished, NAD  Eyes: Normal lids and conjunctive, non-icteric sclera  Ears, Nose, Mouth, Throat: MMM, Normal external inspection ears/nares/mouth/lips/gums. TM normal bilaterally. Pharynx/tonsils no erythema, no exudate. Nasal mucosa normal.   Neck: No masses, trachea midline. No thyroid enlargement. No tenderness/mass appreciated. No lymphadenopathy  Respiratory: Normal respiratory effort. no wheeze, no rhonchi, no rales  Cardiovascular: S1/S2 normal, no murmur, no rub/gallop auscultated. RRR. No lower extremity edema. Pedal pulse II/IV bilaterally DP and PT. No carotid bruit or JVD. No abdominal aortic bruit.  Gastrointestinal: Nontender, no masses. No hepatomegaly, no splenomegaly. No hernia appreciated. Bowel sounds normal. Rectal exam deferred.   Musculoskeletal: Gait normal. No clubbing/cyanosis of digits.   Neurological: Normal balance/coordination. No tremor. No cranial nerve deficit on limited exam. Motor and sensation intact and symmetric. Cerebellar reflexes intact.   Skin: warm, dry, intact. No rash/ulcer. No concerning nevi or subq nodules on limited exam.    Psychiatric: Normal judgment/insight. Normal mood and affect. Oriented x3.    Results for orders placed or performed in visit on 02/03/16 (from the past 72 hour(s))  CBC with Differential/Platelet      Status: None   Collection Time: 02/03/16  8:19 AM  Result Value Ref Range   WBC 4.9 3.8 - 10.8 K/uL   RBC 3.99 3.80 - 5.10 MIL/uL   Hemoglobin 12.7 11.7 - 15.5 g/dL   HCT 38.0 35.0 - 45.0 %   MCV 95.2 80.0 - 100.0 fL   MCH 31.8 27.0 - 33.0 pg   MCHC 33.4 32.0 - 36.0 g/dL   RDW 12.7 11.0 - 15.0 %   Platelets 182 140 - 400 K/uL   MPV 10.9 7.5 - 12.5 fL   Neutro Abs 2,499 1,500 - 7,800 cells/uL   Lymphs Abs 1,470 850 - 3,900 cells/uL   Monocytes Absolute 637 200 - 950 cells/uL   Eosinophils Absolute 245 15 - 500 cells/uL   Basophils Absolute 49 0 - 200 cells/uL   Neutrophils Relative % 51 %   Lymphocytes Relative 30 %   Monocytes Relative 13 %   Eosinophils Relative 5 %   Basophils Relative 1 %   Smear Review Criteria for review not met  COMPLETE METABOLIC PANEL WITH GFR     Status: Abnormal   Collection Time: 02/03/16  8:19 AM  Result Value Ref Range   Sodium 143 135 - 146 mmol/L   Potassium 4.3 3.5 - 5.3 mmol/L   Chloride 107 98 - 110 mmol/L   CO2 27 20 - 31 mmol/L   Glucose, Bld 100 (H) 65 - 99 mg/dL   BUN 10 7 - 25 mg/dL   Creat 0.66 0.50 - 0.99 mg/dL    Comment:   For patients > or = 67 years of age: The upper reference limit for Creatinine is approximately 13% higher for people identified as African-American.      Total Bilirubin 1.0 0.2 - 1.2 mg/dL   Alkaline Phosphatase 52 33 - 130 U/L   AST 21 10 - 35 U/L   ALT 19 6 - 29 U/L   Total Protein 6.2 6.1 - 8.1 g/dL   Albumin 4.0 3.6 - 5.1 g/dL   Calcium 9.7 8.6 - 10.4 mg/dL   GFR, Est African American >89 >=60 mL/min   GFR, Est Non African American >89 >=60 mL/min  Hemoglobin A1c     Status: None   Collection Time: 02/03/16  8:19 AM  Result Value Ref Range   Hgb A1c MFr Bld 5.3 <5.7 %    Comment:   For the purpose of screening for the presence of diabetes:   <5.7%       Consistent with the absence of diabetes 5.7-6.4 %   Consistent with increased risk for diabetes (prediabetes) >=6.5 %     Consistent with  diabetes   This assay result is consistent with a decreased risk of diabetes.   Currently, no consensus exists regarding use of hemoglobin A1c for diagnosis of diabetes in children.   According to American Diabetes Association (ADA) guidelines, hemoglobin A1c <7.0% represents optimal control in non-pregnant diabetic patients. Different metrics may apply to specific patient populations. Standards of Medical Care in Diabetes (ADA).      Mean Plasma Glucose 105 mg/dL  TSH     Status: None   Collection Time: 02/03/16  8:19 AM  Result Value Ref Range   TSH 1.88 mIU/L    Comment:   Reference Range   > or = 20 Years  0.40-4.50   Pregnancy Range First trimester  0.26-2.66 Second trimester 0.55-2.73 Third trimester  0.43-2.91     Lipid panel     Status: None   Collection Time: 02/03/16  8:19 AM  Result Value Ref Range   Cholesterol 157 <200 mg/dL   Triglycerides 142 <150 mg/dL   HDL 56 >50 mg/dL   Total CHOL/HDL Ratio 2.8 <5.0 Ratio   VLDL 28 <30 mg/dL   LDL Cholesterol 73 <100 mg/dL    No results found.   ASSESSMENT/PLAN:    Annual physical exam  Lumbar radiculitis - Plan: HYDROcodone-acetaminophen (NORCO/VICODIN) 5-325 MG tablet  Chronic bilateral low back pain with left-sided sciatica - Plan: HYDROcodone-acetaminophen (NORCO/VICODIN) 5-325 MG tablet   FEMALE PREVENTIVE CARE Updated 02/05/16   ANNUAL SCREENING/COUNSELING  Diet/Exercise - HEALTHY HABITS DISCUSSED TO DECREASE CV RISK History  Smoking Status  . Never Smoker  Smokeless Tobacco  . Never Used    History  Alcohol Use No    Depression screen PHQ 2/9 09/24/2015  Decreased Interest 0  Down, Depressed, Hopeless 0  PHQ - 2 Score 0     Domestic violence concerns - no  HTN SCREENING - SEE [place]  Sexually  active in the past year - No  Need/want STI testing today? - no  Concerns about libido or pain with sex? - no  Plans for pregnancy? - postmenopausal  INFECTIOUS  DISEASE SCREENING  HIV - does not need  GC/CT - does not need  HepC - DOB [birth year] - does not need  TB - does not need  DISEASE SCREENING  Lipid - does not need  DM2 - does not need  Osteoporosis - women age 57+ - needs 08/2016  CANCER SCREENING  Cervical - does not need  Breast - needs  Lung - does not need  Colon - does not need  ADULT VACCINATION  Influenza - annual vaccine recommended  Td - booster every 10 years   Zoster - option at 63, yes at 60+   PCV13 - already has  PPSV23 - already has Immunization History  Administered Date(s) Administered  . Influenza Split 09/26/2011  . Influenza Whole 10/07/2008, 09/10/2009, 11/10/2012  . Influenza,inj,Quad PF,36+ Mos 11/18/2013, 10/14/2014, 09/21/2015  . Pneumococcal Conjugate-13 07/28/2014  . Pneumococcal Polysaccharide-23 10/31/2012  . Td 01/11/2004  . Tdap 04/15/2013  . Zoster 05/22/2012    OTHER  Fall - exercise and Vit D age 57+ - needs  Consider ASA - age 83-59 - does not need       Visit summary with medication list and pertinent instructions was printed for patient to review. All questions at time of visit were answered - patient instructed to contact office with any additional concerns. ER/RTC precautions were reviewed with the patient. Follow-up plan: Return in about 6 months (around 08/04/2016) for followup pain medication refills .

## 2016-02-09 ENCOUNTER — Ambulatory Visit
Admission: RE | Admit: 2016-02-09 | Discharge: 2016-02-09 | Disposition: A | Discharge status: Home / Self Care | Payer: Medicare Other | Source: Ambulatory Visit | Attending: Osteopathic Medicine | Admitting: Osteopathic Medicine

## 2016-02-09 DIAGNOSIS — Z1231 Encounter for screening mammogram for malignant neoplasm of breast: Secondary | ICD-10-CM | POA: Diagnosis not present

## 2016-02-15 DIAGNOSIS — F331 Major depressive disorder, recurrent, moderate: Secondary | ICD-10-CM | POA: Diagnosis not present

## 2016-02-25 ENCOUNTER — Other Ambulatory Visit: Payer: Self-pay | Admitting: Osteopathic Medicine

## 2016-02-26 ENCOUNTER — Encounter: Payer: Self-pay | Admitting: Osteopathic Medicine

## 2016-02-26 ENCOUNTER — Other Ambulatory Visit: Payer: Self-pay | Admitting: Osteopathic Medicine

## 2016-02-26 MED ORDER — DIPHENOXYLATE-ATROPINE 2.5-0.025 MG PO TABS: 1.0000 | ORAL_TABLET | Freq: Four times a day (QID) | ORAL | 2 refills | Status: DC | PRN

## 2016-02-26 NOTE — Telephone Encounter (Signed)
Noted - in the future, can just route refill approvals to me. Apologies for the inconvenience

## 2016-02-26 NOTE — Telephone Encounter (Signed)
Pt advised.

## 2016-02-29 ENCOUNTER — Other Ambulatory Visit: Payer: Self-pay | Admitting: Osteopathic Medicine

## 2016-02-29 MED ORDER — LABETALOL HCL 200 MG PO TABS: ORAL_TABLET | ORAL | 3 refills | Status: DC

## 2016-03-18 ENCOUNTER — Other Ambulatory Visit: Payer: Self-pay | Admitting: Osteopathic Medicine

## 2016-03-18 DIAGNOSIS — G8929 Other chronic pain: Secondary | ICD-10-CM

## 2016-03-18 DIAGNOSIS — M5416 Radiculopathy, lumbar region: Secondary | ICD-10-CM

## 2016-03-18 DIAGNOSIS — M5442 Lumbago with sciatica, left side: Secondary | ICD-10-CM

## 2016-03-18 MED ORDER — HYDROCODONE-ACETAMINOPHEN 5-325 MG PO TABS: 1.0000 | ORAL_TABLET | Freq: Two times a day (BID) | ORAL | 0 refills | Status: DC | PRN

## 2016-03-18 NOTE — Telephone Encounter (Signed)
Rx refilled, [place] controlled substance database reviewed and consistent w/ Hx

## 2016-03-21 ENCOUNTER — Other Ambulatory Visit: Payer: Self-pay | Admitting: Osteopathic Medicine

## 2016-03-21 DIAGNOSIS — M5442 Lumbago with sciatica, left side: Secondary | ICD-10-CM

## 2016-03-21 DIAGNOSIS — G8929 Other chronic pain: Secondary | ICD-10-CM

## 2016-03-21 DIAGNOSIS — M5416 Radiculopathy, lumbar region: Secondary | ICD-10-CM

## 2016-04-05 ENCOUNTER — Encounter: Payer: Self-pay | Admitting: Osteopathic Medicine

## 2016-04-05 MED ORDER — PROMETHAZINE HCL 25 MG PO TABS: ORAL_TABLET | ORAL | 3 refills | Status: DC

## 2016-04-05 NOTE — Addendum Note (Signed)
Addended by: Pixie CasinoUNNINGHAM, Robbi Spells C on: 04/05/2016 05:35 PM   Modules accepted: Orders

## 2016-04-20 ENCOUNTER — Encounter: Payer: Self-pay | Admitting: Osteopathic Medicine

## 2016-04-20 ENCOUNTER — Other Ambulatory Visit: Payer: Self-pay | Admitting: Osteopathic Medicine

## 2016-04-20 DIAGNOSIS — M5416 Radiculopathy, lumbar region: Secondary | ICD-10-CM

## 2016-04-20 DIAGNOSIS — M5442 Lumbago with sciatica, left side: Secondary | ICD-10-CM

## 2016-04-20 DIAGNOSIS — G8929 Other chronic pain: Secondary | ICD-10-CM

## 2016-04-20 MED ORDER — HYDROCODONE-ACETAMINOPHEN 5-325 MG PO TABS: 1.0000 | ORAL_TABLET | Freq: Two times a day (BID) | ORAL | 0 refills | Status: DC | PRN

## 2016-04-22 ENCOUNTER — Other Ambulatory Visit: Payer: Self-pay | Admitting: Osteopathic Medicine

## 2016-04-22 DIAGNOSIS — M5416 Radiculopathy, lumbar region: Secondary | ICD-10-CM

## 2016-04-22 DIAGNOSIS — G8929 Other chronic pain: Secondary | ICD-10-CM

## 2016-04-22 DIAGNOSIS — M5442 Lumbago with sciatica, left side: Secondary | ICD-10-CM

## 2016-04-29 ENCOUNTER — Other Ambulatory Visit: Payer: Self-pay | Admitting: Osteopathic Medicine

## 2016-04-29 DIAGNOSIS — M5442 Lumbago with sciatica, left side: Secondary | ICD-10-CM

## 2016-04-29 DIAGNOSIS — G8929 Other chronic pain: Secondary | ICD-10-CM

## 2016-04-29 DIAGNOSIS — M5416 Radiculopathy, lumbar region: Secondary | ICD-10-CM

## 2016-05-05 ENCOUNTER — Other Ambulatory Visit: Payer: Self-pay | Admitting: Osteopathic Medicine

## 2016-05-11 ENCOUNTER — Other Ambulatory Visit: Payer: Self-pay

## 2016-05-11 MED ORDER — ATORVASTATIN CALCIUM 40 MG PO TABS: 40.0000 mg | ORAL_TABLET | Freq: Every day | ORAL | 3 refills | Status: DC

## 2016-05-19 ENCOUNTER — Other Ambulatory Visit: Payer: Self-pay | Admitting: Osteopathic Medicine

## 2016-05-19 DIAGNOSIS — G8929 Other chronic pain: Secondary | ICD-10-CM

## 2016-05-19 DIAGNOSIS — M5442 Lumbago with sciatica, left side: Secondary | ICD-10-CM

## 2016-05-19 DIAGNOSIS — M5416 Radiculopathy, lumbar region: Secondary | ICD-10-CM

## 2016-05-20 ENCOUNTER — Other Ambulatory Visit: Payer: Self-pay | Admitting: Osteopathic Medicine

## 2016-05-20 DIAGNOSIS — M5442 Lumbago with sciatica, left side: Secondary | ICD-10-CM

## 2016-05-20 DIAGNOSIS — G8929 Other chronic pain: Secondary | ICD-10-CM

## 2016-05-20 DIAGNOSIS — M5416 Radiculopathy, lumbar region: Secondary | ICD-10-CM

## 2016-05-23 ENCOUNTER — Encounter: Payer: Self-pay | Admitting: Osteopathic Medicine

## 2016-05-23 MED ORDER — HYDROCODONE-ACETAMINOPHEN 5-325 MG PO TABS: 1.0000 | ORAL_TABLET | Freq: Two times a day (BID) | ORAL | 0 refills | Status: DC | PRN

## 2016-05-23 NOTE — Telephone Encounter (Signed)
Please call patient: Prescription is printed and left up front for her to pick up. Apologies for the delay since I was out of the office.

## 2016-06-14 ENCOUNTER — Other Ambulatory Visit: Payer: Self-pay | Admitting: Osteopathic Medicine

## 2016-06-16 ENCOUNTER — Encounter: Payer: Self-pay | Admitting: Osteopathic Medicine

## 2016-06-16 DIAGNOSIS — M5416 Radiculopathy, lumbar region: Secondary | ICD-10-CM

## 2016-06-16 DIAGNOSIS — G8929 Other chronic pain: Secondary | ICD-10-CM

## 2016-06-16 DIAGNOSIS — M5442 Lumbago with sciatica, left side: Secondary | ICD-10-CM

## 2016-06-16 MED ORDER — HYDROCODONE-ACETAMINOPHEN 5-325 MG PO TABS: 1.0000 | ORAL_TABLET | Freq: Two times a day (BID) | ORAL | 0 refills | Status: DC | PRN

## 2016-06-21 ENCOUNTER — Other Ambulatory Visit: Payer: Self-pay | Admitting: Osteopathic Medicine

## 2016-06-21 DIAGNOSIS — M5416 Radiculopathy, lumbar region: Secondary | ICD-10-CM

## 2016-06-21 DIAGNOSIS — G8929 Other chronic pain: Secondary | ICD-10-CM

## 2016-06-21 DIAGNOSIS — M5442 Lumbago with sciatica, left side: Secondary | ICD-10-CM

## 2016-06-23 ENCOUNTER — Other Ambulatory Visit: Payer: Self-pay | Admitting: Osteopathic Medicine

## 2016-06-23 DIAGNOSIS — M5442 Lumbago with sciatica, left side: Secondary | ICD-10-CM

## 2016-06-23 DIAGNOSIS — M5416 Radiculopathy, lumbar region: Secondary | ICD-10-CM

## 2016-06-23 DIAGNOSIS — G8929 Other chronic pain: Secondary | ICD-10-CM

## 2016-07-04 DIAGNOSIS — D1801 Hemangioma of skin and subcutaneous tissue: Secondary | ICD-10-CM | POA: Diagnosis not present

## 2016-07-04 DIAGNOSIS — D2372 Other benign neoplasm of skin of left lower limb, including hip: Secondary | ICD-10-CM | POA: Diagnosis not present

## 2016-07-04 DIAGNOSIS — C44519 Basal cell carcinoma of skin of other part of trunk: Secondary | ICD-10-CM | POA: Diagnosis not present

## 2016-07-04 DIAGNOSIS — L821 Other seborrheic keratosis: Secondary | ICD-10-CM | POA: Diagnosis not present

## 2016-07-05 ENCOUNTER — Encounter: Payer: Self-pay | Admitting: Osteopathic Medicine

## 2016-07-05 ENCOUNTER — Ambulatory Visit (INDEPENDENT_AMBULATORY_CARE_PROVIDER_SITE_OTHER): Payer: Medicare Other | Admitting: Osteopathic Medicine

## 2016-07-05 VITALS — BP 123/62 | HR 71 | Temp 98.1°F | Wt 203.0 lb

## 2016-07-05 DIAGNOSIS — R300 Dysuria: Secondary | ICD-10-CM | POA: Diagnosis not present

## 2016-07-05 DIAGNOSIS — R350 Frequency of micturition: Secondary | ICD-10-CM

## 2016-07-05 DIAGNOSIS — M5416 Radiculopathy, lumbar region: Secondary | ICD-10-CM

## 2016-07-05 LAB — POCT URINALYSIS DIPSTICK
Bilirubin, UA: NEGATIVE
Blood, UA: NEGATIVE
GLUCOSE UA: NEGATIVE
Ketones, UA: NEGATIVE
NITRITE UA: NEGATIVE
PROTEIN UA: NEGATIVE
SPEC GRAV UA: 1.01 (ref 1.010–1.025)
UROBILINOGEN UA: 0.2 U/dL
pH, UA: 7 (ref 5.0–8.0)

## 2016-07-05 MED ORDER — ZOSTER VAC RECOMB ADJUVANTED 50 MCG/0.5ML IM SUSR: 0.5000 mL | Freq: Once | INTRAMUSCULAR | 1 refills | Status: AC

## 2016-07-05 NOTE — Progress Notes (Signed)
HPI: Donna Sexton is a 66 y.o. female  who [facility] today, 07/05/16,  for chief complaint of:  Chief Complaint  Patient presents with  . Urinary Frequency    Urinary frequency: Has been ongoing for the past 2 weeks, associated with chronic right lower back pain. Patient notes that she has been drinking a lot more water lately, intentional weight loss as well. No dysuria/hematuria, no odor, no urinary urgency. No abdominal pain, no fever/chills. Back pain comes and goes, worse with twisting motion  Questions about shingles vaccine: Her that there was new shot that she should think about having  Urine culture: none on file     Past medical history, surgical history, social history and family history reviewed.  Patient Active Problem List   Diagnosis Date Noted  . Lumbar radiculitis 09/07/2015  . IBS (irritable bowel syndrome) 08/07/2015  . Elevated LFTs 05/18/2015  . Hyperglycemia 05/12/2015  . Lumbago 11/05/2014  . Basal cell papilloma 09/11/2014  . Dyspnea 09/04/2014  . Nausea without vomiting 04/24/2014  . Restless leg 04/16/2014  . Muscle spasm 02/27/2014  . Overactive bladder 04/09/2013  . Abnormal mammogram 06/22/2012  . Bipolar disorder ([facility]) 09/29/2011  . Hyperlipidemia 09/29/2011  . Essential hypertension 09/29/2011  . Obesity (BMI 30.0-34.9) 09/26/2011  . Telogen effluvium 08/16/2011  . Calcium blood increased 01/11/2011  . Anxiety state 10/04/2010  . Clinical depression 10/04/2010  . Anal bleeding 10/04/2010  . Cannot sleep 10/04/2010  . GERD 07/28/2009  . COMMON MIGRAINE 12/29/2008  . OVERACTIVE BLADDER 02/21/2008  . OSTEOPENIA 10/30/2007    Current medication list and allergy/intolerance information reviewed.   Current Outpatient Prescriptions on File Prior to Visit  Medication Sig Dispense Refill  . amLODipine (NORVASC) 10 MG tablet TAKE 1 TABLET (10 MG TOTAL) BY MOUTH DAILY. 90 tablet 3  .  atorvastatin (LIPITOR) 40 MG tablet Take 1 tablet (40 mg total) by mouth daily. 90 tablet 3  . cyclobenzaprine (FLEXERIL) 5 MG tablet Take 1 tablet (5 mg total) by mouth at bedtime. 90 tablet 3  . diphenoxylate-atropine (LOMOTIL) 2.5-0.025 MG tablet TAKE 1 TABLET FOUR TIMES DAILY AS NEEDED FOR DIARRHEA/LOOSE STOOLS. 30 tablet 0  . divalproex (DEPAKOTE) 250 MG DR tablet Take 750 mg by mouth 2 (two) times daily.    Marland Kitchen. gabapentin (NEURONTIN) 300 MG capsule   10  . HYDROcodone-acetaminophen (NORCO/VICODIN) 5-325 MG tablet Take 1 tablet by mouth 2 (two) times daily as needed for moderate pain or severe pain. #60 for 30(thirty) days 60 tablet 0  . labetalol (NORMODYNE) 200 MG tablet Take 2 tablets in the morning and 1 tablet in the evening. 270 tablet 3  . LYRICA 150 MG capsule TAKE (1) CAPSULE TWICE DAILY. 60 capsule 0  . Naproxen-Esomeprazole 500-20 MG TBEC Take 1 tablet by mouth 2 (two) times daily. 60 tablet 3  . oxybutynin (DITROPAN) 5 MG tablet Take 0.5-1 tablets (2.5-5 mg total) by mouth 2 (two) times daily. 180 tablet 3  . promethazine (PHENERGAN) 25 MG tablet TAKE ONE TABLET BY MOUTH EVERY 8 HOURS AS NEEDED FOR NAUSEA AND VOMITING 30 tablet 3  . vitamin E (VITAMIN E) 400 UNIT capsule Take 1 capsule (400 Units total) by mouth daily.     No current facility-administered medications on file prior to visit.    Allergies  Allergen Reactions  . Morphine Nausea And Vomiting      Review of Systems:  Constitutional: No recent illness  Cardiac: No  chest pain  Respiratory:  No  shortness of breath.   Gastrointestinal: No  abdominal pain, no change on bowel habits   Musculoskeletal: +new myalgia/arthralgia  Skin: No  Rash  Neurologic: No  weakness, No  Dizziness  Exam:  BP 123/62   Pulse 71   Temp 98.1 F (36.7 C) (Oral)   Wt 203 lb (92.1 kg)   SpO2 96%   BMI 34.31 kg/m   Constitutional: VS see above. General Appearance: alert, well-developed, well-nourished, NAD  Eyes: Normal  lids and conjunctive, non-icteric sclera  Ears, Nose, Mouth, Throat: MMM, Normal external inspection ears/nares/mouth/lips/gums.  Neck: No masses, trachea midline.   Respiratory: Normal respiratory effort.  Musculoskeletal: Gait normal. Symmetric and independent movement of all extremities. Paraspinal tenderness right lumbar region, Lloyd's sign negative bilaterally  Neurological: Normal balance/coordination. No tremor.  Skin: warm, dry, intact.   Psychiatric: Normal judgment/insight. Normal mood and affect. Oriented x3.    Recent Results (from the past 2160 hour(s))  POCT Urinalysis Dipstick     Status: Abnormal   Collection Time: 07/05/16 11:13 AM  Result Value Ref Range   Color, UA yellow    Clarity, UA clear    Glucose, UA neg    Bilirubin, UA neg    Ketones, UA neg    Spec Grav, UA 1.010 1.010 - 1.025   Blood, UA neg    pH, UA 7.0 5.0 - 8.0   Protein, UA neg    Urobilinogen, UA 0.2 0.2 or 1.0 E.U./dL   Nitrite, UA neg    Leukocytes, UA Small (1+) (A) Negative     ASSESSMENT/PLAN:   Urinary frequency - Seems most likely due to increased hydration, no other signs/symptoms to suggest UTI, await urine culture, RTC precautions reviewed - Plan: POCT Urinalysis Dipstick, Urine Culture  Lumbar radiculitis - Chronic low back pain, I think chronic musculoskeletal issue more likely than UTI. Home exercises given, consider PT/imaging   Follow-up plan: Return in about 6 months (around 01/04/2017) for recheck blood pressure, ANNUAL PHYSICAL .  Visit summary with medication list and pertinent instructions was printed for patient to review, alert [place] if any changes needed. All questions at time of visit were answered - patient instructed to contact office with any additional concerns. ER/RTC precautions were reviewed with the patient and understanding verbalized.

## 2016-07-06 LAB — URINE CULTURE: Organism ID, Bacteria: NO GROWTH

## 2016-07-07 ENCOUNTER — Encounter: Payer: Self-pay | Admitting: Osteopathic Medicine

## 2016-07-19 ENCOUNTER — Encounter: Payer: Self-pay | Admitting: Osteopathic Medicine

## 2016-07-19 ENCOUNTER — Other Ambulatory Visit: Payer: Self-pay | Admitting: Osteopathic Medicine

## 2016-07-19 DIAGNOSIS — M5416 Radiculopathy, lumbar region: Secondary | ICD-10-CM

## 2016-07-19 DIAGNOSIS — M5442 Lumbago with sciatica, left side: Secondary | ICD-10-CM

## 2016-07-19 DIAGNOSIS — G8929 Other chronic pain: Secondary | ICD-10-CM

## 2016-07-19 DIAGNOSIS — L988 Other specified disorders of the skin and subcutaneous tissue: Secondary | ICD-10-CM | POA: Diagnosis not present

## 2016-07-19 DIAGNOSIS — Z85828 Personal history of other malignant neoplasm of skin: Secondary | ICD-10-CM | POA: Diagnosis not present

## 2016-07-19 DIAGNOSIS — C44519 Basal cell carcinoma of skin of other part of trunk: Secondary | ICD-10-CM | POA: Diagnosis not present

## 2016-07-20 MED ORDER — HYDROCODONE-ACETAMINOPHEN 5-325 MG PO TABS: 1.0000 | ORAL_TABLET | Freq: Two times a day (BID) | ORAL | 0 refills | Status: DC | PRN

## 2016-07-26 ENCOUNTER — Other Ambulatory Visit: Payer: Self-pay | Admitting: Osteopathic Medicine

## 2016-07-26 DIAGNOSIS — M5442 Lumbago with sciatica, left side: Secondary | ICD-10-CM

## 2016-07-26 DIAGNOSIS — G8929 Other chronic pain: Secondary | ICD-10-CM

## 2016-07-26 DIAGNOSIS — M5416 Radiculopathy, lumbar region: Secondary | ICD-10-CM

## 2016-07-28 ENCOUNTER — Other Ambulatory Visit: Payer: Self-pay

## 2016-07-28 DIAGNOSIS — M5136 Other intervertebral disc degeneration, lumbar region: Secondary | ICD-10-CM | POA: Diagnosis not present

## 2016-07-28 MED ORDER — AMLODIPINE BESYLATE 10 MG PO TABS: 10.0000 mg | ORAL_TABLET | Freq: Every day | ORAL | 0 refills | Status: DC

## 2016-08-04 ENCOUNTER — Encounter: Payer: Self-pay | Admitting: Osteopathic Medicine

## 2016-08-15 ENCOUNTER — Other Ambulatory Visit: Payer: Self-pay | Admitting: Osteopathic Medicine

## 2016-08-17 ENCOUNTER — Other Ambulatory Visit: Payer: Self-pay | Admitting: Osteopathic Medicine

## 2016-08-17 ENCOUNTER — Encounter: Payer: Self-pay | Admitting: Osteopathic Medicine

## 2016-08-17 DIAGNOSIS — M5416 Radiculopathy, lumbar region: Secondary | ICD-10-CM

## 2016-08-17 DIAGNOSIS — G8929 Other chronic pain: Secondary | ICD-10-CM

## 2016-08-17 DIAGNOSIS — M5442 Lumbago with sciatica, left side: Secondary | ICD-10-CM

## 2016-08-17 MED ORDER — HYDROCODONE-ACETAMINOPHEN 5-325 MG PO TABS: 1.0000 | ORAL_TABLET | Freq: Two times a day (BID) | ORAL | 0 refills | Status: DC | PRN

## 2016-08-22 ENCOUNTER — Other Ambulatory Visit: Payer: Self-pay | Admitting: *Deleted

## 2016-08-22 ENCOUNTER — Other Ambulatory Visit: Payer: Self-pay | Admitting: Osteopathic Medicine

## 2016-08-22 MED ORDER — GABAPENTIN 400 MG PO CAPS: 400.0000 mg | ORAL_CAPSULE | Freq: Three times a day (TID) | ORAL | 0 refills | Status: DC

## 2016-08-22 NOTE — Telephone Encounter (Signed)
This is a historical med. Can we contact the patient to find out how she is taking it so I can write the correct sig? Thanks!

## 2016-08-22 NOTE — Telephone Encounter (Signed)
Patient called back and verified that she is taking 300mg  tablets four times daily

## 2016-08-22 NOTE — Telephone Encounter (Addendum)
Dr. Ivan AnchorsHommel has prescribed this medication for the patient in the past . The sig says to  take 300 mg by mouth four  Times daily  Called patient and left message to verify what dose she is currently taking   Patient called back and verified that she is taking 300mg  four times daily

## 2016-08-22 NOTE — Telephone Encounter (Signed)
Please let patient know I modified her prescription so she is getting the same total daily dose but only taking it three times daily instead of four. This medication should not be taken more than three times per day.

## 2016-08-23 NOTE — Telephone Encounter (Signed)
Left message advising patient of the change in medication.

## 2016-08-28 ENCOUNTER — Encounter: Payer: Self-pay | Admitting: Osteopathic Medicine

## 2016-08-28 ENCOUNTER — Other Ambulatory Visit: Payer: Self-pay | Admitting: Osteopathic Medicine

## 2016-08-29 ENCOUNTER — Other Ambulatory Visit: Payer: Self-pay

## 2016-08-29 MED ORDER — AMLODIPINE BESYLATE 10 MG PO TABS: 10.0000 mg | ORAL_TABLET | Freq: Every day | ORAL | 0 refills | Status: DC

## 2016-09-16 DIAGNOSIS — F331 Major depressive disorder, recurrent, moderate: Secondary | ICD-10-CM | POA: Diagnosis not present

## 2016-09-18 ENCOUNTER — Other Ambulatory Visit: Payer: Self-pay | Admitting: Osteopathic Medicine

## 2016-09-18 DIAGNOSIS — G8929 Other chronic pain: Secondary | ICD-10-CM

## 2016-09-18 DIAGNOSIS — M5416 Radiculopathy, lumbar region: Secondary | ICD-10-CM

## 2016-09-18 DIAGNOSIS — M5442 Lumbago with sciatica, left side: Secondary | ICD-10-CM

## 2016-09-19 ENCOUNTER — Encounter: Payer: Self-pay | Admitting: Osteopathic Medicine

## 2016-09-19 DIAGNOSIS — M5442 Lumbago with sciatica, left side: Secondary | ICD-10-CM

## 2016-09-19 DIAGNOSIS — G8929 Other chronic pain: Secondary | ICD-10-CM

## 2016-09-19 DIAGNOSIS — M5416 Radiculopathy, lumbar region: Secondary | ICD-10-CM

## 2016-09-19 MED ORDER — HYDROCODONE-ACETAMINOPHEN 5-325 MG PO TABS: 1.0000 | ORAL_TABLET | Freq: Two times a day (BID) | ORAL | 0 refills | Status: AC | PRN

## 2016-09-19 MED ORDER — HYDROCODONE-ACETAMINOPHEN 5-325 MG PO TABS: 1.0000 | ORAL_TABLET | Freq: Two times a day (BID) | ORAL | 0 refills | Status: DC | PRN

## 2016-09-19 NOTE — Addendum Note (Signed)
Addended by: Chalmers CaterUTTLE, Shephanie Romas H on: 09/19/2016 01:50 PM   Modules accepted: Orders

## 2016-09-21 ENCOUNTER — Encounter: Payer: Self-pay | Admitting: Osteopathic Medicine

## 2016-09-21 ENCOUNTER — Other Ambulatory Visit: Payer: Self-pay | Admitting: Osteopathic Medicine

## 2016-09-22 MED ORDER — DIPHENOXYLATE-ATROPINE 2.5-0.025 MG PO TABS: 1.0000 | ORAL_TABLET | Freq: Four times a day (QID) | ORAL | 0 refills | Status: DC | PRN

## 2016-10-03 ENCOUNTER — Other Ambulatory Visit: Payer: Self-pay | Admitting: Physician Assistant

## 2016-10-18 ENCOUNTER — Encounter: Payer: Self-pay | Admitting: Osteopathic Medicine

## 2016-10-19 ENCOUNTER — Other Ambulatory Visit: Payer: Self-pay | Admitting: Osteopathic Medicine

## 2016-10-19 DIAGNOSIS — G8929 Other chronic pain: Secondary | ICD-10-CM

## 2016-10-19 DIAGNOSIS — M5442 Lumbago with sciatica, left side: Secondary | ICD-10-CM

## 2016-10-19 DIAGNOSIS — M5416 Radiculopathy, lumbar region: Secondary | ICD-10-CM

## 2016-10-20 ENCOUNTER — Encounter: Payer: Self-pay | Admitting: Osteopathic Medicine

## 2016-10-20 ENCOUNTER — Other Ambulatory Visit: Payer: Self-pay | Admitting: Osteopathic Medicine

## 2016-10-20 DIAGNOSIS — M5416 Radiculopathy, lumbar region: Secondary | ICD-10-CM

## 2016-10-20 DIAGNOSIS — G8929 Other chronic pain: Secondary | ICD-10-CM

## 2016-10-20 DIAGNOSIS — M5442 Lumbago with sciatica, left side: Secondary | ICD-10-CM

## 2016-10-20 MED ORDER — HYDROCODONE-ACETAMINOPHEN 5-325 MG PO TABS: 1.0000 | ORAL_TABLET | Freq: Two times a day (BID) | ORAL | 0 refills | Status: DC | PRN

## 2016-10-20 NOTE — Progress Notes (Signed)
Rx reporting system reviewed - no concerns

## 2016-11-11 DIAGNOSIS — M5126 Other intervertebral disc displacement, lumbar region: Secondary | ICD-10-CM | POA: Diagnosis not present

## 2016-11-11 DIAGNOSIS — M5136 Other intervertebral disc degeneration, lumbar region: Secondary | ICD-10-CM | POA: Diagnosis not present

## 2016-11-11 DIAGNOSIS — M48062 Spinal stenosis, lumbar region with neurogenic claudication: Secondary | ICD-10-CM | POA: Diagnosis not present

## 2016-11-11 DIAGNOSIS — M47816 Spondylosis without myelopathy or radiculopathy, lumbar region: Secondary | ICD-10-CM | POA: Diagnosis not present

## 2016-11-13 ENCOUNTER — Other Ambulatory Visit: Payer: Self-pay | Admitting: Osteopathic Medicine

## 2016-11-15 ENCOUNTER — Other Ambulatory Visit: Payer: Self-pay | Admitting: Osteopathic Medicine

## 2016-11-15 ENCOUNTER — Telehealth: Payer: Self-pay | Admitting: Osteopathic Medicine

## 2016-11-15 NOTE — Telephone Encounter (Signed)
Received refill request for gabapentin, can we call this patient to confirm if she is also on Lyrica? I have both on her medication list, this is a combination which should be avoided

## 2016-11-16 NOTE — Telephone Encounter (Signed)
Called patient - Left message on cell voicemail to call and advise whether or not she's taking Lyrica as well Gabapentin. Patient should only be taking Lyrica or Gabapentin, not both.

## 2016-11-17 NOTE — Telephone Encounter (Signed)
Patient has follow up appt with provider on 11/18/16 - medication management.

## 2016-11-18 ENCOUNTER — Ambulatory Visit (INDEPENDENT_AMBULATORY_CARE_PROVIDER_SITE_OTHER): Payer: Medicare Other | Admitting: Osteopathic Medicine

## 2016-11-18 ENCOUNTER — Encounter: Payer: Self-pay | Admitting: Osteopathic Medicine

## 2016-11-18 VITALS — BP 139/87 | HR 70

## 2016-11-18 DIAGNOSIS — M5416 Radiculopathy, lumbar region: Secondary | ICD-10-CM

## 2016-11-18 DIAGNOSIS — G8929 Other chronic pain: Secondary | ICD-10-CM | POA: Diagnosis not present

## 2016-11-18 DIAGNOSIS — G2581 Restless legs syndrome: Secondary | ICD-10-CM

## 2016-11-18 DIAGNOSIS — M5442 Lumbago with sciatica, left side: Secondary | ICD-10-CM

## 2016-11-18 MED ORDER — HYDROCODONE-ACETAMINOPHEN 5-325 MG PO TABS: 1.0000 | ORAL_TABLET | Freq: Two times a day (BID) | ORAL | 0 refills | Status: DC | PRN

## 2016-11-18 MED ORDER — TRIAMCINOLONE ACETONIDE 0.1 % EX CREA: 1.0000 "application " | TOPICAL_CREAM | Freq: Two times a day (BID) | CUTANEOUS | 1 refills | Status: DC

## 2016-11-18 MED ORDER — GABAPENTIN 400 MG PO CAPS: 400.0000 mg | ORAL_CAPSULE | Freq: Three times a day (TID) | ORAL | 3 refills | Status: DC

## 2016-11-18 NOTE — Progress Notes (Signed)
HPI: Donna Sexton is a 66 y.o. female who  has a past medical history of ANEMIA (11/12/2007), Anxiety, Arthritis, ASTHMA NOS W/ACUTE EXACERBATION (01/13/2010), BIPOLAR DISORDER UNSPECIFIED (10/29/2007), COMMON MIGRAINE (12/29/2008), GERD (07/28/2009), HYPERLIPIDEMIA (10/29/2007), HYPERTENSION, BENIGN ESSENTIAL (10/29/2007), OSTEOPENIA (10/30/2007), OVERACTIVE BLADDER (02/21/2008), and SLEEP APNEA (12/31/2009).  [facility] today, 11/18/16,  for chief complaint of:  Chief Complaint  Patient presents with  . Follow-up    pain refills    Following up per clinic policy of every 3 month appointment for pain medication refills, she is also due to renew her controlled substance agreement with us. Patient has been compliant with all medications, no concerns.   Patient has been taking Lyrica as well as gabapentin, this has escaped my notice but I talked about it a bit more with the patient she was in the office today. She states that she is taking Lyrica typically twice a day and the gabapentin in the evenings for restless leg. She is currently in the Medicare doughnut hole and Lyrica has become particularly expensive for her. She also had some concerns that gabapentin has recently been linked to dementia problems.   Past medical, surgical, social and family history reviewed:  Patient Active Problem List   Diagnosis Date Noted  . Lumbar radiculitis 09/07/2015  . IBS (irritable bowel syndrome) 08/07/2015  . Elevated LFTs 05/18/2015  . Hyperglycemia 05/12/2015  . Lumbago 11/05/2014  . Basal cell papilloma 09/11/2014  . Dyspnea 09/04/2014  . Nausea without vomiting 04/24/2014  . Restless leg 04/16/2014  . Muscle spasm 02/27/2014  . Overactive bladder 04/09/2013  . Abnormal mammogram 06/22/2012  . Bipolar disorder (HCC) 09/29/2011  . Hyperlipidemia 09/29/2011  . Essential hypertension 09/29/2011  . Obesity (BMI 30.0-34.9) 09/26/2011  . Telogen  effluvium 08/16/2011  . Calcium blood increased 01/11/2011  . Anxiety state 10/04/2010  . Clinical depression 10/04/2010  . Anal bleeding 10/04/2010  . Cannot sleep 10/04/2010  . GERD 07/28/2009  . COMMON MIGRAINE 12/29/2008  . OVERACTIVE BLADDER 02/21/2008  . OSTEOPENIA 10/30/2007    Past Surgical History:  Procedure Laterality Date  . ABDOMINAL HYSTERECTOMY    . BREAST SURGERY  1980   implants bilateral  . CARPAL TUNNEL RELEASE    . CHOLECYSTECTOMY    . HERNIA REPAIR    . PAROTID ENDOSCOPY    . TUBAL LIGATION      Social History   Tobacco Use  . Smoking status: Never Smoker  . Smokeless tobacco: Never Used  Substance Use Topics  . Alcohol use: No    Family History  Problem Relation Age of Onset  . Heart attack Mother   . Hypertension Mother   . Diabetes Mother   . Heart attack Father   . Hypertension Father   . Alcohol abuse Maternal Uncle      Current medication list and allergy/intolerance information reviewed:    Current Outpatient Medications  Medication Sig Dispense Refill  . amLODipine (NORVASC) 10 MG tablet Take 1 tablet (10 mg total) by mouth daily. Appointment needed in December 2018 90 tablet 0  . atorvastatin (LIPITOR) 40 MG tablet Take 1 tablet (40 mg total) by mouth daily. 90 tablet 3  . cyclobenzaprine (FLEXERIL) 5 MG tablet Take 1 tablet (5 mg total) by mouth at bedtime. 90 tablet 3  . diphenoxylate-atropine (LOMOTIL) 2.5-0.025 MG tablet Take 1 tablet by mouth 4 (four) times daily as needed for diarrhea or loose stools. 30 tablet 0  . divalproex (  DEPAKOTE) 250 MG DR tablet Take 750 mg by mouth 2 (two) times daily.    Marland Kitchen gabapentin (NEURONTIN) 400 MG capsule Take 1 capsule (400 mg total) 3 (three) times daily by mouth. Patient needs to schedule a follow up appointment with PCP before more refills. 90 capsule 0  . HYDROcodone-acetaminophen (NORCO/VICODIN) 5-325 MG tablet Take 1 tablet by mouth 2 (two) times daily as needed for moderate pain or severe  pain. #60 for 30(thirty) days. 60 tablet 0  . labetalol (NORMODYNE) 200 MG tablet Take 2 tablets in the morning and 1 tablet in the evening. 270 tablet 3  . LYRICA 150 MG capsule TAKE (1) CAPSULE TWICE DAILY. 60 capsule 0  . Naproxen-Esomeprazole 500-20 MG TBEC Take 1 tablet by mouth 2 (two) times daily. 60 tablet 3  . oxybutynin (DITROPAN) 5 MG tablet Take 0.5-1 tablets (2.5-5 mg total) by mouth 2 (two) times daily. 180 tablet 3  . oxybutynin (DITROPAN) 5 MG tablet TAKE 1/2 TO 1 TABLET TWICE DAILY. 180 tablet 0  . promethazine (PHENERGAN) 25 MG tablet TAKE ONE TABLET BY MOUTH EVERY 8 HOURS AS NEEDED FOR NAUSEA AND VOMITING 30 tablet 3  . vitamin E (VITAMIN E) 400 UNIT capsule Take 1 capsule (400 Units total) by mouth daily.     No current facility-administered medications for this visit.     Allergies  Allergen Reactions  . Morphine Nausea And Vomiting      Review of Systems:  Constitutional:  No  fever, no chills, No recent illnes  HEENT: No  headache, no vision change  Cardiac: No  chest pain, No  pressure, No palpitations,  Respiratory:  No  shortness of breath. No  Cough  Musculoskeletal: No new myalgia/arthralgia  Psychiatric: No  concerns with depression, No  concerns with anxiety, No sleep problems, No mood problems  Exam:  BP 139/87   Pulse 70   SpO2 97%   Constitutional: VS see above. General Appearance: alert, well-developed, well-nourished, NAD  Eyes: Normal lids and conjunctive, non-icteric sclera  Ears, Nose, Mouth, Throat: MMM, Normal external inspection ears/nares/mouth/lips/gums  Neck: No masses, trachea midline.   Respiratory: Normal respiratory effort. no wheeze, no rhonchi, no rales  Cardiovascular: S1/S2 normal, no murmur, no rub/gallop auscultated.   Neurological: Normal balance/coordination. No tremor.   Skin: warm, dry, intact. No rash/ulcer  Psychiatric: Normal judgment/insight. Normal mood and affect. Oriented x3.     ASSESSMENT/PLAN:  The primary encounter diagnosis was Chronic bilateral low back pain with left-sided sciatica. Diagnoses of Lumbar radiculitis and Restless legs were also pertinent to this visit.   On brief review of literature, there appears to be at least 1 study linking antiepileptic medications with future risk of dementia problems. This was done in [place]. I am unaware of any recent updates to prescribing precautions from the FDA. Patient and I had a discussion about risks versus benefits, there is always some risk of harm with medications, occasionally unknown, versus the benefit of improvement to quality of life/pain. Patient is amenable to discontinuing Lyrica for now, increasing gabapentin, continuing hydrocodone twice a day as needed and following up in 3 months  Meds ordered this encounter  Medications  . gabapentin (NEURONTIN) 400 MG capsule    Sig: Take 1-2 capsules (400-800 mg total) 3 (three) times daily by mouth.    Dispense:  180 capsule    Refill:  3  . DISCONTD: HYDROcodone-acetaminophen (NORCO/VICODIN) 5-325 MG tablet    Sig: Take 1 tablet 2 (two) times daily as  needed by mouth for moderate pain or severe pain. #60 for 30(thirty) days.    Dispense:  60 tablet    Refill:  0  . DISCONTD: HYDROcodone-acetaminophen (NORCO/VICODIN) 5-325 MG tablet    Sig: Take 1 tablet 2 (two) times daily as needed by mouth for moderate pain or severe pain. #60 for 30(thirty) days.    Dispense:  60 tablet    Refill:  0  . HYDROcodone-acetaminophen (NORCO/VICODIN) 5-325 MG tablet    Sig: Take 1 tablet 2 (two) times daily as needed by mouth for moderate pain or severe pain. #60 for 30(thirty) days.    Dispense:  60 tablet    Refill:  0  . triamcinolone cream (KENALOG) 0.1 %    Sig: Apply 1 application 2 (two) times daily topically. To affected area(s) as needed    Dispense:  15 g    Refill:  1       Visit summary with medication list and pertinent instructions was printed for patient to review. All  questions at time of visit were answered - patient instructed to contact office with any additional concerns. ER/RTC precautions were reviewed with the patient. Follow-up plan: Return in about 3 months (around 02/18/2017) for refill pain medications, checkup .  Note: Total time spent 25 minutes, greater than 50% of the visit was spent face-to-face counseling and coordinating care for the following: The primary encounter diagnosis was Chronic bilateral low back pain with left-sided sciatica. Diagnoses of Lumbar radiculitis and Restless legs were also pertinent to this visit.Marland Kitchen.  Please note: voice recognition software was used to produce this document, and typos may escape review. Please contact Dr. Lyn HollingsheadAlexander for any needed clarifications.

## 2016-11-23 ENCOUNTER — Other Ambulatory Visit: Payer: Self-pay | Admitting: Osteopathic Medicine

## 2016-12-02 ENCOUNTER — Other Ambulatory Visit: Payer: Self-pay | Admitting: Osteopathic Medicine

## 2016-12-06 ENCOUNTER — Encounter: Payer: Self-pay | Admitting: Osteopathic Medicine

## 2016-12-06 DIAGNOSIS — G8929 Other chronic pain: Secondary | ICD-10-CM

## 2016-12-06 DIAGNOSIS — M5416 Radiculopathy, lumbar region: Secondary | ICD-10-CM

## 2016-12-06 DIAGNOSIS — M5442 Lumbago with sciatica, left side: Secondary | ICD-10-CM

## 2016-12-07 MED ORDER — PREGABALIN 150 MG PO CAPS: 150.0000 mg | ORAL_CAPSULE | Freq: Two times a day (BID) | ORAL | 3 refills | Status: DC

## 2016-12-13 ENCOUNTER — Telehealth: Payer: Self-pay | Admitting: Osteopathic Medicine

## 2016-12-13 MED ORDER — DIPHENOXYLATE-ATROPINE 2.5-0.025 MG PO TABS: 1.0000 | ORAL_TABLET | Freq: Four times a day (QID) | ORAL | 0 refills | Status: DC | PRN

## 2016-12-13 NOTE — Telephone Encounter (Signed)
Controlled rx sent

## 2016-12-14 ENCOUNTER — Encounter: Payer: Self-pay | Admitting: Osteopathic Medicine

## 2016-12-15 ENCOUNTER — Encounter: Payer: Self-pay | Admitting: Osteopathic Medicine

## 2017-01-06 DIAGNOSIS — F331 Major depressive disorder, recurrent, moderate: Secondary | ICD-10-CM | POA: Diagnosis not present

## 2017-01-23 ENCOUNTER — Ambulatory Visit (INDEPENDENT_AMBULATORY_CARE_PROVIDER_SITE_OTHER): Payer: Medicare Other

## 2017-01-23 ENCOUNTER — Ambulatory Visit: Payer: Medicare Other | Admitting: Sports Medicine

## 2017-01-23 ENCOUNTER — Encounter: Payer: Self-pay | Admitting: Sports Medicine

## 2017-01-23 DIAGNOSIS — M170 Bilateral primary osteoarthritis of knee: Secondary | ICD-10-CM

## 2017-01-23 DIAGNOSIS — M19042 Primary osteoarthritis, left hand: Secondary | ICD-10-CM | POA: Insufficient documentation

## 2017-01-23 DIAGNOSIS — M189 Osteoarthritis of first carpometacarpal joint, unspecified: Secondary | ICD-10-CM | POA: Diagnosis not present

## 2017-01-23 DIAGNOSIS — M179 Osteoarthritis of knee, unspecified: Secondary | ICD-10-CM | POA: Diagnosis not present

## 2017-01-23 MED ORDER — CELECOXIB 200 MG PO CAPS: ORAL_CAPSULE | ORAL | 2 refills | Status: DC

## 2017-01-23 NOTE — Progress Notes (Signed)
Subjective:    I'm seeing this patient as a consultation for: Dr. Sunnie Nielsen  CC: Bilateral knee pain, hand pain  HPI: For years this pleasant 67 year old female has had pain in her knees, medial joint line, moderate, persistent without radiation, no mechanical symptoms, significant gelling present.  She really has not tried any oral NSAIDs.  Left hand pain: Localized at the second distal interphalangeal joint, significant nodularity.  Gelling.  Pain is moderate, persistent.  I reviewed the past medical history, family history, social history, surgical history, and allergies today and no changes were needed.  Please see the problem list section below in epic for further details.  Past Medical History: Past Medical History:  Diagnosis Date  . ANEMIA 11/12/2007   Qualifier: Diagnosis of  By: Briscoe Burns CMA, Alvy Beal    . Anxiety   . Arthritis   . ASTHMA NOS W/ACUTE EXACERBATION 01/13/2010   Qualifier: Diagnosis of  By: Yetta Barre MD, Bernadene Bell.   . BIPOLAR DISORDER UNSPECIFIED 10/29/2007   Qualifier: Diagnosis of  By: Yetta Barre MD, Bernadene Bell.   Marland Kitchen COMMON MIGRAINE 12/29/2008   Qualifier: Diagnosis of  By: Yetta Barre MD, Bernadene Bell.   Marland Kitchen GERD 07/28/2009   Qualifier: Diagnosis of  By: Felicity Coyer MD, Raenette Rover HYPERLIPIDEMIA 10/29/2007   Qualifier: Diagnosis of  By: Yetta Barre MD, Bernadene Bell.   . HYPERTENSION, BENIGN ESSENTIAL 10/29/2007   Qualifier: Diagnosis of  By: Yetta Barre MD, Bernadene Bell.   . OSTEOPENIA 10/30/2007   Qualifier: Diagnosis of  By: Yetta Barre MD, Bernadene Bell.   . OVERACTIVE BLADDER 02/21/2008   Qualifier: Diagnosis of  By: Jonny Ruiz MD, Len Blalock   . SLEEP APNEA 12/31/2009   Qualifier: Diagnosis of  By: Yetta Barre MD, Bernadene Bell.    Past Surgical History: Past Surgical History:  Procedure Laterality Date  . ABDOMINAL HYSTERECTOMY    . BREAST SURGERY  1980   implants bilateral  . CARPAL TUNNEL RELEASE    . CHOLECYSTECTOMY    . HERNIA REPAIR    . PAROTID ENDOSCOPY    . TUBAL LIGATION     Social  History: Social History   Socioeconomic History  . Marital status: Divorced    Spouse name: None  . Number of children: None  . Years of education: None  . Highest education level: None  Social Needs  . Financial resource strain: None  . Food insecurity - worry: None  . Food insecurity - inability: None  . Transportation needs - medical: None  . Transportation needs - non-medical: None  Occupational History  . None  Tobacco Use  . Smoking status: Never Smoker  . Smokeless tobacco: Never Used  Substance and Sexual Activity  . Alcohol use: No  . Drug use: No  . Sexual activity: Not Currently  Other Topics Concern  . None  Social History Narrative  . None   Family History: Family History  Problem Relation Age of Onset  . Heart attack Mother   . Hypertension Mother   . Diabetes Mother   . Heart attack Father   . Hypertension Father   . Alcohol abuse Maternal Uncle    Allergies: Allergies  Allergen Reactions  . Morphine Nausea And Vomiting   Medications: See med rec.  Review of Systems: No headache, visual changes, nausea, vomiting, diarrhea, constipation, dizziness, abdominal pain, skin rash, fevers, chills, night sweats, weight loss, swollen lymph nodes, body aches, joint swelling, muscle aches, chest pain, shortness of breath, mood changes, visual or auditory hallucinations.  Objective:   General: Well Developed, well nourished, and in no acute distress.  Neuro:  Extra-ocular muscles intact, able to move all 4 extremities, sensation grossly intact.  Deep tendon reflexes tested were normal. Psych: Alert and oriented, mood congruent with affect. ENT:  Ears and nose appear unremarkable.  Hearing grossly normal. Neck: Unremarkable overall appearance, trachea midline.  No visible thyroid enlargement. Eyes: Conjunctivae and lids appear unremarkable.  Pupils equal and round. Skin: Warm and dry, no rashes noted.  Cardiovascular: Pulses palpable, no extremity  edema. Bilateral knees: Norm minimal effusion, tender to palpation at the medial joint lines and patellar facets bilaterally. ROM normal in flexion and extension and lower leg rotation. Ligaments with solid consistent endpoints including ACL, PCL, LCL, MCL. Negative Mcmurray's and provocative meniscal tests. Non painful patellar compression. Patellar and quadriceps tendons unremarkable. Hamstring and quadriceps strength is normal. Left hand: Extensive Bouchard and Heberden nodes.  X-rays personally reviewed and show moderate osteoarthritis in both knees and moderate osteoarthritis through the hand.  Impression and Recommendations:   This case required medical decision making of moderate complexity.  Primary osteoarthritis of both knees Bilateral knee osteoarthritis, starting Celebrex, aggressive formal physical therapy. Return in 1 month, we will consider bilateral injections if no better.  Localized, primary osteoarthritis of hand, left With Bouchard and Heberden nodes, principal pain generator his left second distal interphalangeal joint. Celebrex, x-rays, injection if no better in a month. ___________________________________________ Ihor Austinhomas J. Benjamin Stainhekkekandam, M.D., ABFM., CAQSM. Primary Care and Sports Medicine [facility]  Adjunct Instructor of Family Medicine  [employer]

## 2017-01-23 NOTE — Assessment & Plan Note (Signed)
With Bouchard and Heberden nodes, principal pain generator his left second distal interphalangeal joint. Celebrex, x-rays, injection if no better in a month.

## 2017-01-23 NOTE — Assessment & Plan Note (Signed)
Bilateral knee osteoarthritis, starting Celebrex, aggressive formal physical therapy. Return in 1 month, we will consider bilateral injections if no better.

## 2017-01-24 ENCOUNTER — Other Ambulatory Visit: Payer: Self-pay | Admitting: Osteopathic Medicine

## 2017-01-24 ENCOUNTER — Encounter: Payer: Self-pay | Admitting: Sports Medicine

## 2017-01-26 ENCOUNTER — Other Ambulatory Visit: Payer: Self-pay

## 2017-01-26 ENCOUNTER — Other Ambulatory Visit: Payer: Self-pay | Admitting: Osteopathic Medicine

## 2017-01-26 NOTE — Telephone Encounter (Signed)
I signed this one yesterday, can we confirm if pharmacy has received a fax?

## 2017-01-30 NOTE — Telephone Encounter (Signed)
As per pharmacist Irving BurtonEmily, pt pick up rx on 01/28/17.

## 2017-01-30 NOTE — Telephone Encounter (Signed)
PMP reviewed, looks like she hasn't filled it yet, I just want to make sure the pharmacy doesn't have duplicate prescriptions.   Can you please contact her pharmacy and see if they received the printed/faxed prescription? If not, I'll go ahead and send an e-prescription.

## 2017-02-07 ENCOUNTER — Other Ambulatory Visit: Payer: Self-pay | Admitting: Osteopathic Medicine

## 2017-02-14 DIAGNOSIS — Z85828 Personal history of other malignant neoplasm of skin: Secondary | ICD-10-CM | POA: Diagnosis not present

## 2017-02-14 DIAGNOSIS — L298 Other pruritus: Secondary | ICD-10-CM | POA: Diagnosis not present

## 2017-02-14 DIAGNOSIS — L308 Other specified dermatitis: Secondary | ICD-10-CM | POA: Diagnosis not present

## 2017-02-14 DIAGNOSIS — L821 Other seborrheic keratosis: Secondary | ICD-10-CM | POA: Diagnosis not present

## 2017-02-15 ENCOUNTER — Encounter: Payer: Self-pay | Admitting: Osteopathic Medicine

## 2017-02-15 ENCOUNTER — Ambulatory Visit: Payer: Medicare Other | Admitting: Osteopathic Medicine

## 2017-02-15 VITALS — BP 120/67 | HR 83 | Temp 98.4°F | Wt 197.0 lb

## 2017-02-15 DIAGNOSIS — Z0000 Encounter for general adult medical examination without abnormal findings: Secondary | ICD-10-CM

## 2017-02-15 DIAGNOSIS — M5442 Lumbago with sciatica, left side: Secondary | ICD-10-CM

## 2017-02-15 DIAGNOSIS — K580 Irritable bowel syndrome with diarrhea: Secondary | ICD-10-CM

## 2017-02-15 DIAGNOSIS — G8929 Other chronic pain: Secondary | ICD-10-CM

## 2017-02-15 DIAGNOSIS — K529 Noninfective gastroenteritis and colitis, unspecified: Secondary | ICD-10-CM | POA: Diagnosis not present

## 2017-02-15 DIAGNOSIS — M19042 Primary osteoarthritis, left hand: Secondary | ICD-10-CM | POA: Diagnosis not present

## 2017-02-15 DIAGNOSIS — M170 Bilateral primary osteoarthritis of knee: Secondary | ICD-10-CM

## 2017-02-15 DIAGNOSIS — M5416 Radiculopathy, lumbar region: Secondary | ICD-10-CM | POA: Diagnosis not present

## 2017-02-15 MED ORDER — GABAPENTIN 400 MG PO CAPS: 400.0000 mg | ORAL_CAPSULE | Freq: Three times a day (TID) | ORAL | 0 refills | Status: DC

## 2017-02-15 MED ORDER — HYDROCODONE-ACETAMINOPHEN 5-325 MG PO TABS: 1.0000 | ORAL_TABLET | Freq: Two times a day (BID) | ORAL | 0 refills | Status: DC | PRN

## 2017-02-15 NOTE — Progress Notes (Signed)
HPI: Donna Sexton is a 66 y.o. female who  has a past medical history of ANEMIA (11/12/2007), Anxiety, Arthritis, ASTHMA NOS W/ACUTE EXACERBATION (01/13/2010), BIPOLAR DISORDER UNSPECIFIED (10/29/2007), COMMON MIGRAINE (12/29/2008), GERD (07/28/2009), HYPERLIPIDEMIA (10/29/2007), HYPERTENSION, BENIGN ESSENTIAL (10/29/2007), OSTEOPENIA (10/30/2007), OVERACTIVE BLADDER (02/21/2008), and SLEEP APNEA (12/31/2009).  she presents to [facility] today, 02/15/17,  for chief complaint of:  No chief complaint on file.   Following up per clinic policy of every 3 month appointment for pain medication refills, she renewed her controlled substance agreement with [place] back 11/2016 at last visit. Patient has been compliant with all medications, no concerns. PMP reviewed - see scanned docs.   Would like to change back to Gabapentin, Lyrica is too expensive.   Bad allergies - nasal congestion, coughing since the weather got nicer.   Diarrhea - has been on Lomotil for some time. Loose stool with certain foods, anxiety.     Past medical, surgical, social and family history reviewed:  Patient Active Problem List   Diagnosis Date Noted  . Primary osteoarthritis of both knees 01/23/2017  . Localized, primary osteoarthritis of hand, left 01/23/2017  . Lumbar radiculitis 09/07/2015  . IBS (irritable bowel syndrome) 08/07/2015  . Elevated LFTs 05/18/2015  . Hyperglycemia 05/12/2015  . Lumbago 11/05/2014  . Basal cell papilloma 09/11/2014  . Dyspnea 09/04/2014  . Nausea without vomiting 04/24/2014  . Restless leg 04/16/2014  . Muscle spasm 02/27/2014  . Overactive bladder 04/09/2013  . Abnormal mammogram 06/22/2012  . Bipolar disorder (HCC) 09/29/2011  . Hyperlipidemia 09/29/2011  . Essential hypertension 09/29/2011  . Obesity (BMI 30.0-34.9) 09/26/2011  . Telogen effluvium 08/16/2011  . Calcium blood increased 01/11/2011  . Anxiety state 10/04/2010  . Clinical  depression 10/04/2010  . Anal bleeding 10/04/2010  . Cannot sleep 10/04/2010  . GERD 07/28/2009  . COMMON MIGRAINE 12/29/2008  . OVERACTIVE BLADDER 02/21/2008  . OSTEOPENIA 10/30/2007    Past Surgical History:  Procedure Laterality Date  . ABDOMINAL HYSTERECTOMY    . BREAST SURGERY  1980   implants bilateral  . CARPAL TUNNEL RELEASE    . CHOLECYSTECTOMY    . HERNIA REPAIR    . PAROTID ENDOSCOPY    . TUBAL LIGATION      Social History   Tobacco Use  . Smoking status: Never Smoker  . Smokeless tobacco: Never Used  Substance Use Topics  . Alcohol use: No    Family History  Problem Relation Age of Onset  . Heart attack Mother   . Hypertension Mother   . Diabetes Mother   . Heart attack Father   . Hypertension Father   . Alcohol abuse Maternal Uncle      Current medication list and allergy/intolerance information reviewed:    Current Outpatient Medications  Medication Sig Dispense Refill  . amLODipine (NORVASC) 10 MG tablet TAKE 1 TABLET ONCE DAILY. 90 tablet 0  . atorvastatin (LIPITOR) 40 MG tablet Take 1 tablet (40 mg total) by mouth daily. 90 tablet 3  . celecoxib (CELEBREX) 200 MG capsule One to 2 tablets by mouth daily as needed for pain. 60 capsule 2  . cyclobenzaprine (FLEXERIL) 5 MG tablet Take 1 tablet (5 mg total) by mouth at bedtime. 90 tablet 3  . diphenoxylate-atropine (LOMOTIL) 2.5-0.025 MG tablet TAKE 1 TABLET FOUR TIMES DAILY AS NEEDED FOR DIARRHEA/LOOSE STOOLS. 30 tablet 1  . divalproex (DEPAKOTE) 250 MG DR tablet Take 750 mg by mouth 2 (two) times daily.    Marland Kitchen  HYDROcodone-acetaminophen (NORCO/VICODIN) 5-325 MG tablet Take 1 tablet 2 (two) times daily as needed by mouth for moderate pain or severe pain. #60 for 30(thirty) days. 60 tablet 0  . labetalol (NORMODYNE) 200 MG tablet Take 2 tablets in the morning and 1 tablet in the evening. 270 tablet 3  . oxybutynin (DITROPAN) 5 MG tablet Take 0.5-1 tablets (2.5-5 mg total) by mouth 2 (two) times daily. 180  tablet 3  . pregabalin (LYRICA) 150 MG capsule Take 1 capsule (150 mg total) by mouth 2 (two) times daily. 60 capsule 3  . promethazine (PHENERGAN) 25 MG tablet TAKE ONE TABLET BY MOUTH EVERY 8 HOURS AS NEEDED FOR NAUSEA AND VOMITING 30 tablet 3  . promethazine (PHENERGAN) 25 MG tablet TAKE 1 TABLET EVERY 8 HOURS AS NEEDED FOR NAUSEA AND VOMITING. 30 tablet 0  . triamcinolone cream (KENALOG) 0.1 % Apply 1 application 2 (two) times daily topically. To affected area(s) as needed 15 g 1  . vitamin E (VITAMIN E) 400 UNIT capsule Take 1 capsule (400 Units total) by mouth daily.     No current facility-administered medications for this visit.     Allergies  Allergen Reactions  . Morphine Nausea And Vomiting      Review of Systems:  Constitutional:  No  fever, no chills, No recent illnes  HEENT: No  headache, no vision change  Cardiac: No  chest pain, No  pressure, No palpitations,  Respiratory:  No  shortness of breath. No  Cough  Musculoskeletal: No new myalgia/arthralgia  Psychiatric: No  concerns with depression, No  concerns with anxiety, No sleep problems, No mood problems  Exam:  BP 120/67   Pulse 83   Temp 98.4 F (36.9 C) (Oral)   Wt 197 lb (89.4 kg)   BMI 33.29 kg/m   Constitutional: VS see above. General Appearance: alert, well-developed, well-nourished, NAD  Eyes: Normal lids and conjunctive, non-icteric sclera  Ears, Nose, Mouth, Throat: MMM, Normal external inspection ears/nares/mouth/lips/gums  Neck: No masses, trachea midline.   Respiratory: Normal respiratory effort. no wheeze, no rhonchi, no rales  Cardiovascular: S1/S2 normal, no murmur, no rub/gallop auscultated.   Neurological: Normal balance/coordination. No tremor.   Skin: warm, dry, intact. No rash/ulcer  Psychiatric: Normal judgment/insight. Normal mood and affect. Oriented x3.     ASSESSMENT/PLAN: 3 printed Rx given to patient - she is responsible for these, no refills from me until next  visit.   Chronic bilateral low back pain with left-sided sciatica - Plan: HYDROcodone-acetaminophen (NORCO/VICODIN) 5-325 MG tablet, DISCONTINUED: HYDROcodone-acetaminophen (NORCO/VICODIN) 5-325 MG tablet, DISCONTINUED: HYDROcodone-acetaminophen (NORCO/VICODIN) 5-325 MG tablet  Lumbar radiculitis - Plan: HYDROcodone-acetaminophen (NORCO/VICODIN) 5-325 MG tablet, DISCONTINUED: HYDROcodone-acetaminophen (NORCO/VICODIN) 5-325 MG tablet, DISCONTINUED: HYDROcodone-acetaminophen (NORCO/VICODIN) 5-325 MG tablet  Localized, primary osteoarthritis of hand, left  Primary osteoarthritis of both knees  Annual physical exam - labs for future visit - not billed today  - Plan: CBC, COMPLETE METABOLIC PANEL WITH GFR, Lipid panel, TSH, Hemoglobin A1c, VITAMIN D 25 Hydroxy (Vit-D Deficiency, Fractures)  Chronic diarrhea - concern for continue lomotil, would like to get ahead of this, sounds liek IBS-D. See pt instructions     Meds ordered this encounter  Medications  . gabapentin (NEURONTIN) 400 MG capsule    Sig: Take 1-2 capsules (400-800 mg total) by mouth 3 (three) times daily.    Dispense:  180 capsule    Refill:  0  . DISCONTD: HYDROcodone-acetaminophen (NORCO/VICODIN) 5-325 MG tablet    Sig: Take 1 tablet by mouth 2 (two)  times daily as needed for moderate pain or severe pain. #60 for 30(thirty) days.    Dispense:  60 tablet    Refill:  0  . DISCONTD: HYDROcodone-acetaminophen (NORCO/VICODIN) 5-325 MG tablet    Sig: Take 1 tablet by mouth 2 (two) times daily as needed for moderate pain or severe pain. #60 for 30(thirty) days.    Dispense:  60 tablet    Refill:  0  . HYDROcodone-acetaminophen (NORCO/VICODIN) 5-325 MG tablet    Sig: Take 1 tablet by mouth 2 (two) times daily as needed for moderate pain or severe pain. #60 for 30(thirty) days.    Dispense:  60 tablet    Refill:  0     Patient Instructions   For diarrhea - see below for food to avoid (look up Low FODMAP diet) and try  peppermint oil   2017 UpToDate Characteristics and sources of common FODMAPs  Word that corresponds to letter in acronym Compounds in this category Foods that contain these compounds  F Fermentable  O Oligosaccharides Fructans, galacto-oligosaccharides Wheat, barley, rye, onion, leek, white part of spring onion, garlic, shallots, artichokes, beetroot, fennel, peas, chicory, pistachio, cashews, legumes, lentils, and chickpeas  D Disaccharides Lactose Milk, custard, ice cream, and yogurt  M Monosaccharides "Free fructose" (fructose in excess of glucose) Apples, pears, mangoes, cherries, watermelon, asparagus, sugar snap peas, honey, high-fructose corn syrup  A And  P Polyols Sorbitol, mannitol, maltitol, and xylitol Apples, pears, apricots, cherries, nectarines, peaches, plums, watermelon, mushrooms, cauliflower, artificially sweetened chewing gum and confectionery  FODMAPs: fermentable oligosaccharides, disaccharides, monosaccharides, and polyols. Adapted by permission from [employer]: Limited [employer]an Journal of Gastroenterology. Lonell FaceShepherd SJ, Lomer MC, [employer] [place]. Short-chain carbohydrates and functional gastrointestinal disorders. Am J Gastroenterol 2013; 108:707. Copyright  2013. www.nature.com/ajg. Graphic 1610990186 Version 2.0    Even fairly severe allergies can typically be treated with over-the-counter medications, I usually will recommend a combination of steroid nasal spray such as Flonase or Nasonex or either one of their generics, in combination with an antihistamine such as Allegra, Zyrtec, or Claritin or one of the generics. If the combination isn't helping, let me know and I can send in a prescription antihistamine nasal spray to use with the oral medications as well.         Visit summary with medication list and pertinent instructions was printed for patient to review. All questions at time of visit were answered - patient instructed to contact office with any additional  concerns. ER/RTC precautions were reviewed with the patient. Follow-up plan: [facility] (around [facility]15/2017[facility] sooner if needed .  Note: Total time spent 25 minutes, greater than 50% of the visit was spent face-to-face counseling and coordinating care for the following: The primary encounter diagnosis was Chronic bilateral low back pain with left-sided sciatica. Diagnoses of Lumbar radiculitis, Localized, primary osteoarthritis of hand, left, and Primary osteoarthritis of both knees were also pertinent to this visit.Marland Kitchen.  Please note: voice recognition software was used to produce this document, and typos may escape review. Please contact Dr. Lyn HollingsheadAlexander for any needed clarifications.

## 2017-02-15 NOTE — Patient Instructions (Addendum)
For diarrhea - see below for food to avoid (look up Low FODMAP diet) and try peppermint oil   2017 UpToDate Characteristics and sources of common FODMAPs  Word that corresponds to letter in acronym Compounds in this category Foods that contain these compounds  F Fermentable  O Oligosaccharides Fructans, galacto-oligosaccharides Wheat, barley, rye, onion, leek, white part of spring onion, garlic, shallots, artichokes, beetroot, fennel, peas, chicory, pistachio, cashews, legumes, lentils, and chickpeas  D Disaccharides Lactose Milk, custard, ice cream, and yogurt  M Monosaccharides "Free fructose" (fructose in excess of glucose) Apples, pears, mangoes, cherries, watermelon, asparagus, sugar snap peas, honey, high-fructose corn syrup  A And  P Polyols Sorbitol, mannitol, maltitol, and xylitol Apples, pears, apricots, cherries, nectarines, peaches, plums, watermelon, mushrooms, cauliflower, artificially sweetened chewing gum and confectionery  FODMAPs: fermentable oligosaccharides, disaccharides, monosaccharides, and polyols. Adapted by permission from [employer]: Limited [employer]an Journal of Gastroenterology. Lonell FaceShepherd SJ, Lomer MC, [place] [place]. Short-chain carbohydrates and functional gastrointestinal disorders. Am J Gastroenterol 2013; 108:707. Copyright  2013. www.nature.com/ajg. Graphic 1610990186 Version 2.0    Even fairly severe allergies can typically be treated with over-the-counter medications, I usually will recommend a combination of steroid nasal spray such as Flonase or Nasonex or either one of their generics, in combination with an antihistamine such as Allegra, Zyrtec, or Claritin or one of the generics. If the combination isn't helping, let me know and I can send in a prescription antihistamine nasal spray to use with the oral medications as well.

## 2017-02-17 ENCOUNTER — Ambulatory Visit: Payer: Self-pay | Admitting: Osteopathic Medicine

## 2017-02-20 ENCOUNTER — Ambulatory Visit: Payer: Self-pay | Admitting: Sports Medicine

## 2017-02-27 ENCOUNTER — Encounter: Payer: Self-pay | Admitting: Osteopathic Medicine

## 2017-02-27 ENCOUNTER — Other Ambulatory Visit: Payer: Self-pay | Admitting: Osteopathic Medicine

## 2017-02-28 ENCOUNTER — Other Ambulatory Visit: Payer: Self-pay | Admitting: Osteopathic Medicine

## 2017-02-28 ENCOUNTER — Telehealth: Payer: Self-pay | Admitting: Osteopathic Medicine

## 2017-02-28 NOTE — Telephone Encounter (Signed)
Received PA for Celebrex from patient message. Awaiting determination.-HM

## 2017-03-01 ENCOUNTER — Encounter: Payer: Self-pay | Admitting: Osteopathic Medicine

## 2017-03-01 LAB — HEMOGLOBIN A1C
EAG (MMOL/L): 6.3 (calc)
HEMOGLOBIN A1C: 5.6 %{Hb} (ref <5.7)
MEAN PLASMA GLUCOSE: 114 (calc)

## 2017-03-01 LAB — COMPLETE METABOLIC PANEL WITH GFR
AG Ratio: 2.3 (calc) (ref 1.0–2.5)
ALBUMIN MSPROF: 4.1 g/dL (ref 3.6–5.1)
ALKALINE PHOSPHATASE (APISO): 52 U/L (ref 33–130)
ALT: 10 U/L (ref 6–29)
AST: 12 U/L (ref 10–35)
BUN: 13 mg/dL (ref 7–25)
CO2: 28 mmol/L (ref 20–32)
Calcium: 9.3 mg/dL (ref 8.6–10.4)
Chloride: 106 mmol/L (ref 98–110)
Creat: 0.59 mg/dL (ref 0.50–0.99)
GFR, Est African American: 110 mL/min/{1.73_m2} (ref >=60)
GFR, Est Non African American: 95 mL/min/{1.73_m2} (ref >=60)
GLUCOSE: 103 mg/dL — AB (ref 65–99)
Globulin: 1.8 g/dL (calc) — ABNORMAL LOW (ref 1.9–3.7)
Potassium: 4.4 mmol/L (ref 3.5–5.3)
Sodium: 140 mmol/L (ref 135–146)
Total Bilirubin: 0.8 mg/dL (ref 0.2–1.2)
Total Protein: 5.9 g/dL — ABNORMAL LOW (ref 6.1–8.1)

## 2017-03-01 LAB — CBC
HCT: 35.4 % (ref 35.0–45.0)
Hemoglobin: 12.2 g/dL (ref 11.7–15.5)
MCH: 31.9 pg (ref 27.0–33.0)
MCHC: 34.5 g/dL (ref 32.0–36.0)
MCV: 92.7 fL (ref 80.0–100.0)
MPV: 10.4 fL (ref 7.5–12.5)
PLATELETS: 183 10*3/uL (ref 140–400)
RBC: 3.82 10*6/uL (ref 3.80–5.10)
RDW: 11.9 % (ref 11.0–15.0)
WBC: 4.3 10*3/uL (ref 3.8–10.8)

## 2017-03-01 LAB — VITAMIN D 25 HYDROXY (VIT D DEFICIENCY, FRACTURES): Vit D, 25-Hydroxy: 21 ng/mL — ABNORMAL LOW (ref 30–100)

## 2017-03-01 LAB — LIPID PANEL
CHOLESTEROL: 160 mg/dL (ref <200)
HDL: 52 mg/dL (ref >50)
LDL CHOLESTEROL (CALC): 92 mg/dL
Non-HDL Cholesterol (Calc): 108 mg/dL (calc) (ref <130)
Total CHOL/HDL Ratio: 3.1 (calc) (ref <5.0)
Triglycerides: 75 mg/dL (ref <150)

## 2017-03-01 LAB — TSH: TSH: 2.23 mIU/L (ref 0.40–4.50)

## 2017-03-02 NOTE — Telephone Encounter (Signed)
Received VM from LovellNicole with [employer] on the triage line. She advised the PA  has been approved. Valid dates: 03/01/17-03/01/18.   Pharmacy advised. Left VM for Pt advising of status update.

## 2017-03-09 ENCOUNTER — Other Ambulatory Visit: Payer: Self-pay | Admitting: Osteopathic Medicine

## 2017-03-11 ENCOUNTER — Other Ambulatory Visit: Payer: Self-pay | Admitting: Osteopathic Medicine

## 2017-03-15 ENCOUNTER — Ambulatory Visit: Payer: Medicare Other | Admitting: Sports Medicine

## 2017-03-15 ENCOUNTER — Ambulatory Visit: Payer: Medicare Other | Admitting: Osteopathic Medicine

## 2017-03-15 ENCOUNTER — Ambulatory Visit: Payer: Medicare Other | Admitting: Rehabilitative and Restorative Service Providers"

## 2017-03-15 ENCOUNTER — Encounter: Payer: Self-pay | Admitting: Sports Medicine

## 2017-03-15 ENCOUNTER — Encounter: Payer: Self-pay | Admitting: Osteopathic Medicine

## 2017-03-15 VITALS — BP 104/68 | HR 73 | Resp 18 | Wt 198.0 lb

## 2017-03-15 DIAGNOSIS — M6281 Muscle weakness (generalized): Secondary | ICD-10-CM | POA: Diagnosis not present

## 2017-03-15 DIAGNOSIS — M545 Low back pain, unspecified: Secondary | ICD-10-CM

## 2017-03-15 DIAGNOSIS — R29898 Other symptoms and signs involving the musculoskeletal system: Secondary | ICD-10-CM | POA: Diagnosis not present

## 2017-03-15 DIAGNOSIS — E669 Obesity, unspecified: Secondary | ICD-10-CM

## 2017-03-15 DIAGNOSIS — M65352 Trigger finger, left little finger: Secondary | ICD-10-CM | POA: Diagnosis not present

## 2017-03-15 DIAGNOSIS — M170 Bilateral primary osteoarthritis of knee: Secondary | ICD-10-CM | POA: Diagnosis not present

## 2017-03-15 DIAGNOSIS — M25562 Pain in left knee: Secondary | ICD-10-CM

## 2017-03-15 DIAGNOSIS — Z87898 Personal history of other specified conditions: Secondary | ICD-10-CM

## 2017-03-15 DIAGNOSIS — G8929 Other chronic pain: Secondary | ICD-10-CM | POA: Diagnosis not present

## 2017-03-15 DIAGNOSIS — M25561 Pain in right knee: Secondary | ICD-10-CM | POA: Diagnosis not present

## 2017-03-15 DIAGNOSIS — M19042 Primary osteoarthritis, left hand: Secondary | ICD-10-CM

## 2017-03-15 DIAGNOSIS — M79645 Pain in left finger(s): Secondary | ICD-10-CM | POA: Diagnosis not present

## 2017-03-15 HISTORY — DX: Trigger finger, left little finger: M65.352

## 2017-03-15 MED ORDER — LORCASERIN HCL 10 MG PO TABS: 10.0000 mg | ORAL_TABLET | Freq: Two times a day (BID) | ORAL | 3 refills | Status: DC

## 2017-03-15 NOTE — Progress Notes (Signed)
HPI: Donna Sexton is a 67 y.o. female who  has a past medical history of ANEMIA (11/12/2007), Anxiety, Arthritis, ASTHMA NOS W/ACUTE EXACERBATION (01/13/2010), BIPOLAR DISORDER UNSPECIFIED (10/29/2007), COMMON MIGRAINE (12/29/2008), GERD (07/28/2009), HYPERLIPIDEMIA (10/29/2007), HYPERTENSION, BENIGN ESSENTIAL (10/29/2007), OSTEOPENIA (10/30/2007), OVERACTIVE BLADDER (02/21/2008), and SLEEP APNEA (12/31/2009).  she presents to [facility] today, 03/15/17,  for chief complaint of:  Discuss weight loss medications   Struggling with weight especially wants to lose some pounds to help her arthritis of the knees. No exercise to speak of, no food logging.   Past medical history, surgical history, social history and family history reviewed. No updates needed.   Current medication list and allergy/intolerance information reviewed.    Current Outpatient Medications on File Prior to Visit  Medication Sig Dispense Refill  . amLODipine (NORVASC) 10 MG tablet TAKE 1 TABLET ONCE DAILY. 90 tablet 0  . atorvastatin (LIPITOR) 40 MG tablet Take 1 tablet (40 mg total) by mouth daily. 90 tablet 3  . celecoxib (CELEBREX) 200 MG capsule One to 2 tablets by mouth daily as needed for pain. 60 capsule 2  . cyclobenzaprine (FLEXERIL) 5 MG tablet Take 1 tablet (5 mg total) by mouth at bedtime. 90 tablet 3  . diphenoxylate-atropine (LOMOTIL) 2.5-0.025 MG tablet TAKE 1 TABLET FOUR TIMES DAILY AS NEEDED FOR DIARRHEA/LOOSE STOOLS. 30 tablet 1  . divalproex (DEPAKOTE ER) 500 MG 24 hr tablet   0  . gabapentin (NEURONTIN) 400 MG capsule Take 1-2 capsules (400-800 mg total) by mouth 3 (three) times daily. 180 capsule 0  . [START ON 04/18/2017] HYDROcodone-acetaminophen (NORCO/VICODIN) 5-325 MG tablet Take 1 tablet by mouth 2 (two) times daily as needed for moderate pain or severe pain. #60 for 30(thirty) days. 60 tablet 0  . labetalol (NORMODYNE) 200 MG tablet TAKE 2 TABLETS IN THE MORNING AND 1  TABLET IN THE EVENING. 270 tablet 0  . oxybutynin (DITROPAN) 5 MG tablet TAKE 1/2 TO 1 TABLET TWICE DAILY. 180 tablet 0  . promethazine (PHENERGAN) 25 MG tablet TAKE 1 TABLET EVERY 8 HOURS AS NEEDED FOR NAUSEA AND VOMITING. 30 tablet 2  . triamcinolone cream (KENALOG) 0.1 % Apply 1 application 2 (two) times daily topically. To affected area(s) as needed 15 g 1  . vitamin E (VITAMIN E) 400 UNIT capsule Take 1 capsule (400 Units total) by mouth daily.     No current facility-administered medications on file prior to visit.    Allergies  Allergen Reactions  . Morphine Nausea And Vomiting      Review of Systems:  Constitutional: No recent illness  HEENT: No  headache, no vision change  Cardiac: No  chest pain, No  pressure, No palpitations  Respiratory:  No  shortness of breath. No  Cough  Musculoskeletal: No new myalgia/arthralgia  Neurologic: No  weakness, No  Dizziness   Exam:  BP 104/68   Pulse 73   Resp 18   Wt 198 lb (89.8 kg)   BMI 33.46 kg/m   Constitutional: VS see above. General Appearance: alert, well-developed, well-nourished, NAD  Eyes: Normal lids and conjunctive, non-icteric sclera  Ears, Nose, Mouth, Throat: MMM, Normal external inspection ears/nares/mouth/lips/gums.  Neck: No masses, trachea midline.   Respiratory: Normal respiratory effort. no wheeze, no rhonchi, no rales  Cardiovascular: S1/S2 normal, no murmur, no rub/gallop auscultated. RRR.   Musculoskeletal: Gait normal. Symmetric and independent movement of all extremities  Neurological: Normal balance/coordination. No tremor.  Skin: warm, dry, intact.   Psychiatric: Normal  judgment/insight. Normal mood and affect. Oriented x3.     ASSESSMENT/PLAN:   Obesity (BMI 30-39.9) - discussed intesive lifestyle interventions see pt instructions. Trial Belviq if covered   History of headache  Chronic bilateral low back pain without sciatica   Meds ordered this encounter  Medications  .  Lorcaserin HCl 10 MG TABS    Sig: Take 10 mg by mouth 2 (two) times daily.    Dispense:  180 tablet    Refill:  3    Patient Instructions  Weight loss: important things to remember  It is hard work! You will have setbacks, but don't get discouraged. The goal is not short-term success, it is long-term health.   Looking at the numbers is important to track your progress and set goals, but how you are feeling and your overall health are the most important things! BMI and pounds and calories and miles logged aren't everything - they are tools to help [place] reach your goals.  You can do this!!!   Things to remember for exercise for weight loss:   Please note - I am not a certified personal trainer. I can present you with ideas and general workout goals, but an exercise program is largely up to you. Find something you can stick with, and something you enjoy!   As you progress in your exercise regimen think about gradually increasing the following, week by week:   intensity (how strenuous is your workout)  frequency (how often you are exercising)  duration (how many minutes at a time you are exercising)  Walking for 20 minutes a day is certainly better than nothing, but more strenuous exercise will develop better cardiovascular fitness.   interval training (high-intensity alternating with low-intensity, think walk/jog rather than just walk)  muscle strengthening exercises (weight lifting, calisthenics, yoga) - this also helps prevent osteoporosis!   Things to remember for diet changes for weight loss:   Please note - I am not a certified dietician. I can present you with ideas and general diet goals, but a meal plan is largely up to you. I am happy to refer you to a dietician who can give you a detailed meal plan.  Apps/logs are crucial to track how you're eating! It's not realistic to be logging everything you eat forever, but when you're starting a healthy eating lifestyle it's very  helpful, and checking in with logs now and then helps you stick to your program!   Calorie restriction with the goal weight loss of no more than one to one and a half pounds per week.   Increase lean protein such as chicken, fish, [place].   Decrease fatty foods such as dairy, butter.   Decrease sugary foods. Avoid sugary drinks such as soda or juice.  Increase fiber found in fruit and vegetables.   Medications approved for long-term use for obesity  Qsymia (Phentermine and Topiramate) - may be a good option given you headache history, but I think this will interact w/ depakote   Saxenda (Liraglutide 3 mg/day) - might cause mausea  Contrave (Bupropion and Naltrexone) - might interfere w/ pain meds  Lorcaserin (Belviq or Belviq XR)  Orlistat (Xenical, Alli)  Bupropion (Wellbutrin) I recommend that you research the above medications and see which one(s) your insurance may or may not cover: If you call your insurance, ask them specifically what medications are on their formulary that are approved for obesity treatment. They should be able to send you a list or tell you over  the phone. Remember, medications aren't magic! You MUST be diligent about lifestyle changes as well!      Follow-up plan: Return in about 4 weeks (around 04/12/2017) for weight check w/ Dr A, see how you're doing on the medicines .  Visit summary with medication list and pertinent instructions was printed for patient to review, alert us if any changes needed. All questions at time of visit were answered - patient instructed to contact office with any additional concerns. ER/RTC precautions were reviewed with the patient and understanding verbalized.   Note: Total time spent 25 minutes, greater than 50% of the visit was spent face-to-face counseling and coordinating care for the following: The primary encounter diagnosis was Obesity (BMI 30-39.9). Diagnoses of History of headache and Chronic bilateral low back pain  without sciatica were also pertinent to this visit.Marland Kitchen.  Please note: voice recognition software was used to produce this document, and typos may escape review. Please contact Dr. Lyn HollingsheadAlexander for any needed clarifications.

## 2017-03-15 NOTE — Patient Instructions (Addendum)
Weight loss: important things to remember  It is hard work! You will have setbacks, but don't get discouraged. The goal is not short-term success, it is long-term health.   Looking at the numbers is important to track your progress and set goals, but how you are feeling and your overall health are the most important things! BMI and pounds and calories and miles logged aren't everything - they are tools to help us reach your goals.  You can do this!!!   Things to remember for exercise for weight loss:   Please note - I am not a certified personal trainer. I can present you with ideas and general workout goals, but an exercise program is largely up to you. Find something you can stick with, and something you enjoy!   As you progress in your exercise regimen think about gradually increasing the following, week by week:   intensity (how strenuous is your workout)  frequency (how often you are exercising)  duration (how many minutes at a time you are exercising)  Walking for 20 minutes a day is certainly better than nothing, but more strenuous exercise will develop better cardiovascular fitness.   interval training (high-intensity alternating with low-intensity, think walk/jog rather than just walk)  muscle strengthening exercises (weight lifting, calisthenics, yoga) - this also helps prevent osteoporosis!   Things to remember for diet changes for weight loss:   Please note - I am not a certified dietician. I can present you with ideas and general diet goals, but a meal plan is largely up to you. I am happy to refer you to a dietician who can give you a detailed meal plan.  Apps/logs are crucial to track how you're eating! It's not realistic to be logging everything you eat forever, but when you're starting a healthy eating lifestyle it's very helpful, and checking in with logs now and then helps you stick to your program!   Calorie restriction with the goal weight loss of no more than one  to one and a half pounds per week.   Increase lean protein such as chicken, fish, [place].   Decrease fatty foods such as dairy, butter.   Decrease sugary foods. Avoid sugary drinks such as soda or juice.  Increase fiber found in fruit and vegetables.   Medications approved for long-term use for obesity  Qsymia (Phentermine and Topiramate) - may be a good option given you headache history, but I think this will interact w/ depakote   Saxenda (Liraglutide 3 mg/day) - might cause mausea  Contrave (Bupropion and Naltrexone) - might interfere w/ pain meds  Lorcaserin (Belviq or Belviq XR)  Orlistat (Xenical, Alli)  Bupropion (Wellbutrin) I recommend that you research the above medications and see which one(s) your insurance may or may not cover: If you call your insurance, ask them specifically what medications are on their formulary that are approved for obesity treatment. They should be able to send you a list or tell you over the phone. Remember, medications aren't magic! You MUST be diligent about lifestyle changes as well!

## 2017-03-15 NOTE — Assessment & Plan Note (Signed)
Improved considerably with physical therapy. 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

## 2017-03-15 NOTE — Patient Instructions (Signed)
HIP: Hamstrings - Supine  Place strap around foot. Raise leg up, keeping knee straight.  Bend opposite knee to protect back if indicated. Hold 30 seconds. 3 reps per set, 2-3 sets per day  Outer Hip Stretch: Reclined IT Band Stretch (Strap)   Strap around one foot, pull leg across body until you feel a pull or stretch in the outside of your hip, with shoulders on mat. Hold for 30 seconds. Repeat 3 times each leg. 2-3 times/day.   Quads / HF, Prone KNEE: Quadriceps - Prone    Place strap around ankle. Bring ankle toward buttocks. Press hip into surface. Hold 30 seconds. Repeat 3 times per session. Do 2-3 sessions per day.

## 2017-03-15 NOTE — Therapy (Signed)
[facility] [address] [place], [place], [address] Phone: [phone]   Fax:  [phone]  Physical Therapy Evaluation  Patient Details  Name: Donna Sexton MRN: [id number] Date of Birth: Apr 21, 1950 Referring Provider: Dr Benjamin Stainhekkekandam    Encounter Date: 03/15/2017  PT End of Session - 03/15/17 0805    Visit Number  1    Number of Visits  12    Date for PT Re-Evaluation  04/26/17    PT Start Time  0806    PT Stop Time  0900    PT Time Calculation (min)  54 min    Activity Tolerance  Patient tolerated treatment well       Past Medical History:  Diagnosis Date  . ANEMIA 11/12/2007   Qualifier: Diagnosis of  By: Briscoe BurnsArchie CMA, Alvy BealLakisha    . Anxiety   . Arthritis   . ASTHMA NOS W/ACUTE EXACERBATION 01/13/2010   Qualifier: Diagnosis of  By: Yetta BarreJones MD, Bernadene Bellhomas L.   . BIPOLAR DISORDER UNSPECIFIED 10/29/2007   Qualifier: Diagnosis of  By: Yetta BarreJones MD, Bernadene Bellhomas L.   Marland Kitchen. COMMON MIGRAINE 12/29/2008   Qualifier: Diagnosis of  By: Yetta BarreJones MD, Bernadene Bellhomas L.   Marland Kitchen. GERD 07/28/2009   Qualifier: Diagnosis of  By: Felicity CoyerLeschber MD, Raenette RoverValerie A   . HYPERLIPIDEMIA 10/29/2007   Qualifier: Diagnosis of  By: Yetta BarreJones MD, Bernadene Bellhomas L.   . HYPERTENSION, BENIGN ESSENTIAL 10/29/2007   Qualifier: Diagnosis of  By: Yetta BarreJones MD, Bernadene Bellhomas L.   . OSTEOPENIA 10/30/2007   Qualifier: Diagnosis of  By: Yetta BarreJones MD, Bernadene Bellhomas L.   . OVERACTIVE BLADDER 02/21/2008   Qualifier: Diagnosis of  By: Jonny RuizJohn MD, Len BlalockJames W   . SLEEP APNEA 12/31/2009   Qualifier: Diagnosis of  By: Yetta BarreJones MD, Bernadene Bellhomas L.     Past Surgical History:  Procedure Laterality Date  . ABDOMINAL HYSTERECTOMY    . BREAST SURGERY  1980   implants bilateral  . CARPAL TUNNEL RELEASE    . CHOLECYSTECTOMY    . HERNIA REPAIR    . PAROTID ENDOSCOPY    . TUBAL LIGATION      There were no vitals filed for this visit.   Subjective Assessment - 03/15/17 0810    Subjective  Patient reports that she has been having bilat knee pain for  years due to osteoarthritis. She has had increased pain in the past few months with difficulty getting up and down; in and out of the floor; etc.     Pertinent History  arthritis primarily knees and hands; HTN; HNP lumbar spine - has injections in low back ~ yearly     How long can you sit comfortably?  2-3 hours     How long can you stand comfortably?  20-30 min     How long can you walk comfortably?  20-30 min     Diagnostic tests  xrays     Patient Stated Goals  help decrease pain and learn exercises for home - wants to be able to get out of the floor by herself; get hands moving better     Currently in Pain?  Yes    Pain Score  0-No pain 9/10 with getting in and out of the floor     Pain Location  Knee    Pain Orientation  Right;Left    Pain Type  Chronic pain    Pain Onset  More than a month ago    Pain Frequency  Intermittent    Aggravating  Factors   prolonged sitting; driving distances; getting in and out of the floor; bending; squatting; walking on inclines/unlevel surfaces     Pain Relieving Factors  meds; heating pad; ice     Multiple Pain Sites  Yes    Pain Location  Hand    Pain Orientation  Left    Pain Descriptors / Indicators  Burning    Pain Type  Chronic pain    Pain Onset  More than a month ago    Pain Frequency  Intermittent    Aggravating Factors   holding small objects; gripping     Pain Relieving Factors  meds;          [facility] PT Assessment - 03/15/17 0001      Assessment   Medical Diagnosis  Bilat knee pain     Referring Provider  Dr Benjamin Stain     Onset Date/Surgical Date  06/10/16 arthritic pain for several years     Hand Dominance  Right    Next MD Visit  03/15/17    Prior Therapy  none       Precautions   Precautions  None      Balance Screen   Has the patient fallen in the past 6 months  No    Has the patient had a decrease in activity level because of a fear of falling?   No    Is the patient reluctant to leave their home because of a fear of  falling?   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; Producer, television/film/video prior to that     Leisure  household chores; yard work; Tax adviser; Forensic psychologist       Observation/Other Assessments   Observations  Lt > Rt arthritic changes noted in fingers     Focus on Therapeutic Outcomes (FOTO)   48% limitation       Sensation   Additional Comments  WFL's per pt report       Posture/Postural Control   Posture Comments  head forward; shoudlers rounded; flexed forward at hips; LE in ER      AROM   Right/Left Wrist  -- ROM bilat wrists WFL's    Right/Left Finger  -- ROM all fingers WFL's triggering Lt little finger     Right/Left Thumb  -- WFL's bilat     Right/Left Hip  -- tight hip flexors bilat; tight rotation Rt > Lt     Right Knee Extension  -10    Right Knee Flexion  128    Left Knee Extension  -2    Left Knee Flexion  131    Right/Left Ankle  -- WFL's slight tightness with ankle DF       Strength   Right Hip Flexion  5/5    Right Hip Extension  4/5    Right Hip ABduction  4/5    Right Hip ADduction  5/5    Left Hip Flexion  5/5    Left Hip Extension  4/5    Left Hip ABduction  4+/5    Left Hip ADduction  5/5    Right Knee Flexion  5/5    Right Knee Extension  5/5  Left Knee Flexion  5/5    Left Knee Extension  5/5    Right/Left Ankle  -- PF 3+//5 bilat       Flexibility   Hamstrings  tight Rt 75 deg; Lt 80 deg     Quadriceps  Rt 80 deg; Lt 85 deg in prone     ITB  tight Rt > Lt     Piriformis  tight Rt > Lt       Palpation   Palpation comment  minimal tenderness or tightness bilat knees; some discomfort Lt index PIP; DIP joints       Balance   Balance Assessed  -- SLS Rt ~ 10 sec; Lt 2-3 sec       Functional Gait  Assessment   Gait assessed   --  ambulates with flexed posture; LE's in ER Rt > Lt              Objective measurements completed on examination: See above findings.      [facility] Adult PT Treatment/Exercise - 03/15/17 0001      Knee/Hip Exercises: Stretches   Passive Hamstring Stretch  Right;Left;2 reps;30 seconds supine with strap     Quad Stretch  Left;Both;2 reps;30 seconds prone with strap     ITB Stretch  Right;Left;2 reps;30 seconds supine with strap       Moist Heat Therapy   Number Minutes Moist Heat  15 Minutes    Moist Heat Location  Knee bilat - anterior and posterior              PT Education - 03/15/17 0845    Education provided  Yes    Education Details  HEP - education use of heat okay for arthritic joints unless there is increased temp or swelling in the joints     Person(s) Educated  Patient    Methods  Explanation;Demonstration;Tactile cues;Verbal cues;Handout    Comprehension  Verbalized understanding;Returned demonstration;Verbal cues required;Tactile cues required          PT Long Term Goals - 03/15/17 1146      PT LONG TERM GOAL #1   Title  Improve tissue extensibility through LE's with patient to demonstrate increased quad stretch prone to 95-105 degrees bilat 04/26/17    Time  6    Period  Weeks    Status  New      PT LONG TERM GOAL #2   Title  Increased strength bilat LE's to 5-/5 to 5/5 with resistive testing 04/26/17    Time  6    Period  Weeks    Status  New      PT LONG TERM GOAL #3   Title  Patient demonstrates ability to move from floor to standing with appropriate assist independently 04/26/17    Time  6    Period  Weeks    Status  New      PT LONG TERM GOAL #4   Title  Independent in HEP 04/26/17    Time  6    Period  Weeks    Status  New      PT LONG TERM GOAL #5   Title  Improve FOTO to </= 39% limitation 04/26/17    Time  6    Period  Weeks    Status  New             Plan - 03/15/17 1138    Clinical Impression Statement  Donna Sexton presents  with bilat knee pain and  dysfunction with symptoms gradually increasing over the past year. She has limited strength, ROM and mobility through hips and knees Rt > Lt; decreased mobiility and functional activitiy level Lt hand/fingers; decreased funcitonal activity level; pain on a daily basis. Patient will benefit from PT to address problems identified.     Clinical Presentation  Stable    Rehab Potential  Good    Clinical Impairments Affecting Rehab Potential  chronic knee pain; arthritic changes     PT Frequency  2x / week    PT Duration  6 weeks    PT Treatment/Interventions  Patient/family education;ADLs/Self Care Home Management;Cryotherapy;Electrical Stimulation;Iontophoresis 4mg /ml Dexamethasone;Moist Heat;Ultrasound;Dry needling;Manual techniques;Neuromuscular re-education;Therapeutic activities;Therapeutic exercise    PT Next Visit Plan  review HEP; add hip flexor stretch; assess grip and pinch strength; ROM and strengthening Lt > Rt hand; bilat LE's     Consulted and Agree with Plan of Care  Patient       Patient will benefit from skilled therapeutic intervention in order to improve the following deficits and impairments:  Postural dysfunction, Improper body mechanics, Pain, Increased fascial restricitons, Increased muscle spasms, Decreased range of motion, Decreased mobility, Decreased strength, Decreased activity tolerance  Visit Diagnosis: Chronic pain of left knee - Plan: PT plan of care cert/re-cert  Chronic pain of right knee - Plan: PT plan of care cert/re-cert  Pain in left finger(s) - Plan: PT plan of care cert/re-cert  Muscle weakness (generalized) - Plan: PT plan of care cert/re-cert  Other symptoms and signs involving the musculoskeletal system - Plan: PT plan of care cert/re-cert     Problem List Patient Active Problem List   Diagnosis Date Noted  . Trigger finger, left little finger 03/15/2017  . Primary osteoarthritis of both knees 01/23/2017  . Localized,  primary osteoarthritis of hand, left 01/23/2017  . Lumbar radiculitis 09/07/2015  . IBS (irritable bowel syndrome) 08/07/2015  . Elevated LFTs 05/18/2015  . Hyperglycemia 05/12/2015  . Lumbago 11/05/2014  . Basal cell papilloma 09/11/2014  . Dyspnea 09/04/2014  . Nausea without vomiting 04/24/2014  . Restless leg 04/16/2014  . Muscle spasm 02/27/2014  . Overactive bladder 04/09/2013  . Abnormal mammogram 06/22/2012  . Bipolar disorder (HCC) 09/29/2011  . Hyperlipidemia 09/29/2011  . Essential hypertension 09/29/2011  . Obesity (BMI 30.0-34.9) 09/26/2011  . Telogen effluvium 08/16/2011  . Calcium blood increased 01/11/2011  . Anxiety state 10/04/2010  . Clinical depression 10/04/2010  . Anal bleeding 10/04/2010  . Cannot sleep 10/04/2010  . GERD 07/28/2009  . COMMON MIGRAINE 12/29/2008  . OVERACTIVE BLADDER 02/21/2008  . OSTEOPENIA 10/30/2007    Donna Sexton PT, MPH  03/15/2017, 12:04 PM  [facility] [address] [place], [place], [address] Phone: [phone]   Fax:  [phone]  Name: Donna Sexton MRN: [id number] Date of Birth: 1950/05/04

## 2017-03-15 NOTE — Assessment & Plan Note (Signed)
Discussed the anatomy and pathophysiology. She will work with physical therapy, if this fails we can do a tendon sheath injection.

## 2017-03-15 NOTE — Assessment & Plan Note (Signed)
Good improvements with physical therapy, Celebrex. She would like to proceed with Visco supplementation, we will go ahead and get this approved, she can return to start injections, I will probably do a steroid and the first Visco injection at her follow-up visit.

## 2017-03-15 NOTE — Progress Notes (Signed)
Subjective:    CC: Multiple issues  HPI: Bilateral knee osteoarthritis: Improved considerably with Celebrex, therapy, still has some discomfort, weakness, pain when getting up from a seated or kneeling position.  Would like to consider Visco supplementation.  Left finger locking: Worse in the morning, fifth finger, tends to lock in flexion but can pop open.  No trauma, no constitutional symptoms.  Hand osteoarthritis has improved significantly with therapy and Celebrex.  I reviewed the past medical history, family history, social history, surgical history, and allergies today and no changes were needed.  Please see the problem list section below in epic for further details.  Past Medical History: Past Medical History:  Diagnosis Date  . ANEMIA 11/12/2007   Qualifier: Diagnosis of  By: Briscoe Burns CMA, Alvy Beal    . Anxiety   . Arthritis   . ASTHMA NOS W/ACUTE EXACERBATION 01/13/2010   Qualifier: Diagnosis of  By: Yetta Barre MD, Bernadene Bell.   . BIPOLAR DISORDER UNSPECIFIED 10/29/2007   Qualifier: Diagnosis of  By: Yetta Barre MD, Bernadene Bell.   Marland Kitchen COMMON MIGRAINE 12/29/2008   Qualifier: Diagnosis of  By: Yetta Barre MD, Bernadene Bell.   Marland Kitchen GERD 07/28/2009   Qualifier: Diagnosis of  By: Felicity Coyer MD, Raenette Rover HYPERLIPIDEMIA 10/29/2007   Qualifier: Diagnosis of  By: Yetta Barre MD, Bernadene Bell.   . HYPERTENSION, BENIGN ESSENTIAL 10/29/2007   Qualifier: Diagnosis of  By: Yetta Barre MD, Bernadene Bell.   . OSTEOPENIA 10/30/2007   Qualifier: Diagnosis of  By: Yetta Barre MD, Bernadene Bell.   . OVERACTIVE BLADDER 02/21/2008   Qualifier: Diagnosis of  By: Jonny Ruiz MD, Len Blalock   . SLEEP APNEA 12/31/2009   Qualifier: Diagnosis of  By: Yetta Barre MD, Bernadene Bell.    Past Surgical History: Past Surgical History:  Procedure Laterality Date  . ABDOMINAL HYSTERECTOMY    . BREAST SURGERY  1980   implants bilateral  . CARPAL TUNNEL RELEASE    . CHOLECYSTECTOMY    . HERNIA REPAIR    . PAROTID ENDOSCOPY    . TUBAL LIGATION     Social History: Social History     Socioeconomic History  . Marital status: Divorced    Spouse name: None  . Number of children: None  . Years of education: None  . Highest education level: None  Social Needs  . Financial resource strain: None  . Food insecurity - worry: None  . Food insecurity - inability: None  . Transportation needs - medical: None  . Transportation needs - non-medical: None  Occupational History  . None  Tobacco Use  . Smoking status: Never Smoker  . Smokeless tobacco: Never Used  Substance and Sexual Activity  . Alcohol use: No  . Drug use: No  . Sexual activity: Not Currently  Other Topics Concern  . None  Social History Narrative  . None   Family History: Family History  Problem Relation Age of Onset  . Heart attack Mother   . Hypertension Mother   . Diabetes Mother   . Heart attack Father   . Hypertension Father   . Alcohol abuse Maternal Uncle    Allergies: Allergies  Allergen Reactions  . Morphine Nausea And Vomiting   Medications: See med rec.  Review of Systems: No fevers, chills, night sweats, weight loss, chest pain, or shortness of breath.   Objective:    General: Well Developed, well nourished, and in no acute distress.  Neuro: Alert and oriented x3, extra-ocular muscles intact, sensation grossly intact.  HEENT:  Normocephalic, atraumatic, pupils equal round reactive to light, neck supple, no masses, no lymphadenopathy, thyroid nonpalpable.  Skin: Warm and dry, no rashes. Cardiac: Regular rate and rhythm, no murmurs rubs or gallops, no lower extremity edema.  Respiratory: Clear to auscultation bilaterally. Not using accessory muscles, speaking in full sentences.  Impression and Recommendations:    Primary osteoarthritis of both knees Good improvements with physical therapy, Celebrex. She would like to proceed with Visco supplementation, we will go ahead and get this approved, she can return to start injections, I will probably do a steroid and the first  Visco injection at her follow-up visit.  Trigger finger, left little finger Discussed the anatomy and pathophysiology. She will work with physical therapy, if this fails we can do a tendon sheath injection.  Localized, primary osteoarthritis of hand, left Improved considerably with physical therapy. 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   ___________________________________________ Ihor Austinhomas J. Benjamin Stainhekkekandam, M.D., ABFM., CAQSM. Primary Care and Sports Medicine [facility]  Adjunct Instructor of Family Medicine  [employer]

## 2017-03-20 ENCOUNTER — Telehealth: Payer: Self-pay | Admitting: Sports Medicine

## 2017-03-20 ENCOUNTER — Encounter: Payer: Self-pay | Admitting: Sports Medicine

## 2017-03-20 NOTE — Telephone Encounter (Signed)
Spoke with patient and advised her that Orthovisc was approved. Patient was advised that she would have an estimated out of pocket of 20%. Patient wants to think it over and call back to decide about going forward with the knee injections.

## 2017-03-20 NOTE — Telephone Encounter (Signed)
-----  Message from Tasia Catchings, [place] sent at 03/17/2017 10:14 AM EST ----- Orthovisc is covered. There is only a copay for the office visit. If collected at the time of service, the copay will be $40.00. The deductible does not apply, but the patient has a coinsurance of 20% of the allowable amount for the product. Once the out of pocket is met, the patient will have no financial responsibility. Call reference number is [id number]. ----- Message ----- From: Tasia Catchings, CMA Sent: 03/15/2017   2:00 PM To: Tasia Catchings, CMA  I have submitted information to [employer] and awaiting response.  ----- Message ----- From: Silverio Decamp, MD Sent: 03/15/2017  10:10 AM To: Tasia Catchings, CMA  Orthovisc approval please, both knees, x-ray confirmed, failed NSAIDs, therapy. ___________________________________________ Gwen Her. Dianah Field, M.D., ABFM., CAQSM. Primary Care and [place] Instructor of [place] [employer]

## 2017-03-23 ENCOUNTER — Encounter: Payer: Self-pay | Admitting: Rehabilitative and Restorative Service Providers"

## 2017-03-23 ENCOUNTER — Ambulatory Visit: Payer: Medicare Other | Admitting: Rehabilitative and Restorative Service Providers"

## 2017-03-23 DIAGNOSIS — M6281 Muscle weakness (generalized): Secondary | ICD-10-CM

## 2017-03-23 DIAGNOSIS — R29898 Other symptoms and signs involving the musculoskeletal system: Secondary | ICD-10-CM | POA: Diagnosis not present

## 2017-03-23 DIAGNOSIS — M25561 Pain in right knee: Secondary | ICD-10-CM | POA: Diagnosis not present

## 2017-03-23 DIAGNOSIS — M25562 Pain in left knee: Secondary | ICD-10-CM

## 2017-03-23 DIAGNOSIS — M79645 Pain in left finger(s): Secondary | ICD-10-CM

## 2017-03-23 DIAGNOSIS — G8929 Other chronic pain: Secondary | ICD-10-CM

## 2017-03-23 NOTE — Therapy (Signed)
[facility] [place] [address] [place], [place], [address] Phone: [phone]   Fax:  [phone]  Physical Therapy Treatment  Patient Details  Name: Donna Sexton MRN: [id number] Date of Birth: 08/22/1950 Referring Provider: Dr Benjamin Stain    Encounter Date: 03/23/2017  PT End of Session - 03/23/17 1144    Visit Number  2    Number of Visits  12    Date for PT Re-Evaluation  04/26/17    PT Start Time  1145    PT Stop Time  1233    PT Time Calculation (min)  48 min    Activity Tolerance  Patient tolerated treatment well       Past Medical History:  Diagnosis Date  . ANEMIA 11/12/2007   Qualifier: Diagnosis of  By: Briscoe Burns CMA, Alvy Beal    . Anxiety   . Arthritis   . ASTHMA NOS W/ACUTE EXACERBATION 01/13/2010   Qualifier: Diagnosis of  By: Yetta Barre MD, Bernadene Bell.   . BIPOLAR DISORDER UNSPECIFIED 10/29/2007   Qualifier: Diagnosis of  By: Yetta Barre MD, Bernadene Bell.   Marland Kitchen COMMON MIGRAINE 12/29/2008   Qualifier: Diagnosis of  By: Yetta Barre MD, Bernadene Bell.   Marland Kitchen GERD 07/28/2009   Qualifier: Diagnosis of  By: Felicity Coyer MD, Raenette Rover HYPERLIPIDEMIA 10/29/2007   Qualifier: Diagnosis of  By: Yetta Barre MD, Bernadene Bell.   . HYPERTENSION, BENIGN ESSENTIAL 10/29/2007   Qualifier: Diagnosis of  By: Yetta Barre MD, Bernadene Bell.   . OSTEOPENIA 10/30/2007   Qualifier: Diagnosis of  By: Yetta Barre MD, Bernadene Bell.   . OVERACTIVE BLADDER 02/21/2008   Qualifier: Diagnosis of  By: Jonny Ruiz MD, Len Blalock   . SLEEP APNEA 12/31/2009   Qualifier: Diagnosis of  By: Yetta Barre MD, Bernadene Bell.     Past Surgical History:  Procedure Laterality Date  . ABDOMINAL HYSTERECTOMY    . BREAST SURGERY  1980   implants bilateral  . CARPAL TUNNEL RELEASE    . CHOLECYSTECTOMY    . HERNIA REPAIR    . PAROTID ENDOSCOPY    . TUBAL LIGATION      There were no vitals filed for this visit.  Subjective Assessment - 03/23/17 1146    Subjective  Patient reports that her injections for knees are scheduled to  startnext week. She has done some of the exercises but not a lot. Has been trying to do the heel raises. Afraid to get in the floor to do the other exercises.     Currently in Pain?  No/denies    Pain Score  0-No pain         [facility] PT Assessment - 03/23/17 0001      Assessment   Medical Diagnosis  Bilat knee pain     Referring Provider  Dr Benjamin Stain     Onset Date/Surgical Date  06/10/16 arthritic pain for several years     Hand Dominance  Right    Next MD Visit  03/15/17    Prior Therapy  none       Strength   Right Hand Grip (lbs)  40    Right Hand Lateral Pinch  10 lbs    Right Hand 3 Point Pinch  4.5 lbs    Left Hand Grip (lbs)  31    Left Hand Lateral Pinch  8 lbs    Left Hand 3 Point Pinch  4 lbs  [facility] Adult PT Treatment/Exercise - 03/23/17 0001      Knee/Hip Exercises: Stretches   Passive Hamstring Stretch  Right;Left;2 reps;30 seconds supine with strap     ITB Stretch  Right;Left;2 reps;30 seconds supine with strap       Knee/Hip Exercises: Aerobic   Nustep  L5 x 5 min LE only       Knee/Hip Exercises: Standing   Hip Abduction  AROM;Stengthening;Right;Left;10 reps    Forward Step Up  Right;Left;10 reps;Step Height: 4";Hand Hold: 2    Functional Squat  10 reps small knee bend      Knee/Hip Exercises: Supine   Other Supine Knee/Hip Exercises  clam holding one LE still moving opposite x 10 reps green TB       Hand Exercises   DIPJ Extension  Self ROM;Left 10-20 sec x 3 reps Lt index finger     Sponges  gross grasp with small sponge ball 2-3 sec hold x 10     Rubberbands  instructed in finger ab with rubber bands (to work on at home)     Other Hand Exercises  wrist flexor stretch 20-30 sec x 2; prayer stretch 20-30 sec x 3              PT Education - 03/23/17 1239    Education provided  Yes    Education Details  HEP     Person(s) Educated  Patient    Methods  Explanation;Demonstration;Tactile cues;Verbal cues;Handout     Comprehension  Verbalized understanding;Returned demonstration;Verbal cues required;Tactile cues required          PT Long Term Goals - 03/23/17 1145      PT LONG TERM GOAL #1   Title  Improve tissue extensibility through LE's with patient to demonstrate increased quad stretch prone to 95-105 degrees bilat 04/26/17    Time  6    Period  Weeks    Status  On-going      PT LONG TERM GOAL #2   Title  Increased strength bilat LE's to 5-/5 to 5/5 with resistive testing 04/26/17    Time  6    Period  Weeks    Status  On-going      PT LONG TERM GOAL #3   Title  Patient demonstrates ability to move from floor to standing with appropriate assist independently 04/26/17    Time  6    Period  Weeks    Status  On-going      PT LONG TERM GOAL #4   Title  Independent in HEP 04/26/17    Time  6    Period  Weeks    Status  On-going      PT LONG TERM GOAL #5   Title  Improve FOTO to </= 39% limitation 04/26/17    Time  6    Period  Weeks    Status  On-going            Plan - 03/23/17 1250    Clinical Impression Statement  [name] presents today with no knee pain currently. She added exercises today without difficulty. Reviewed HEP and added strengthening for LE's as well as ROM and strengthening for Lt hand/fingers. Discussed splinting Lt index finger in extension at night using tongue depressor to splint if she would like to support finger in extensioin to prevent further DIP flexion change due to arthritis. [name] is scheduled for injections for knees Tuesday, March 19th with Dr Benjamin Stainhekkekandam.     Rehab Potential  Good    Clinical Impairments Affecting Rehab Potential  chronic knee pain; arthritic changes     PT Frequency  2x / week    PT Duration  6 weeks    PT Treatment/Interventions  Patient/family education;ADLs/Self Care Home Management;Cryotherapy;Electrical Stimulation;Iontophoresis 4mg /ml Dexamethasone;Moist Heat;Ultrasound;Dry needling;Manual techniques;Neuromuscular  re-education;Therapeutic activities;Therapeutic exercise    PT Next Visit Plan  review HEP; add hip flexor stretch; progress LE strengthening     Consulted and Agree with Plan of Care  Patient       Patient will benefit from skilled therapeutic intervention in order to improve the following deficits and impairments:  Postural dysfunction, Improper body mechanics, Pain, Increased fascial restricitons, Increased muscle spasms, Decreased range of motion, Decreased mobility, Decreased strength, Decreased activity tolerance  Visit Diagnosis: Chronic pain of left knee  Chronic pain of right knee  Pain in left finger(s)  Muscle weakness (generalized)  Other symptoms and signs involving the musculoskeletal system     Problem List Patient Active Problem List   Diagnosis Date Noted  . Trigger finger, left little finger 03/15/2017  . Primary osteoarthritis of both knees 01/23/2017  . Localized, primary osteoarthritis of hand, left 01/23/2017  . Lumbar radiculitis 09/07/2015  . IBS (irritable bowel syndrome) 08/07/2015  . Elevated LFTs 05/18/2015  . Hyperglycemia 05/12/2015  . Lumbago 11/05/2014  . Basal cell papilloma 09/11/2014  . Dyspnea 09/04/2014  . Nausea without vomiting 04/24/2014  . Restless leg 04/16/2014  . Muscle spasm 02/27/2014  . Overactive bladder 04/09/2013  . Abnormal mammogram 06/22/2012  . Bipolar disorder (HCC) 09/29/2011  . Hyperlipidemia 09/29/2011  . Essential hypertension 09/29/2011  . Obesity (BMI 30.0-34.9) 09/26/2011  . Telogen effluvium 08/16/2011  . Calcium blood increased 01/11/2011  . Anxiety state 10/04/2010  . Clinical depression 10/04/2010  . Anal bleeding 10/04/2010  . Cannot sleep 10/04/2010  . GERD 07/28/2009  . COMMON MIGRAINE 12/29/2008  . OVERACTIVE BLADDER 02/21/2008  . OSTEOPENIA 10/30/2007    Ayaat Jansma Rober Minion PT, MPH  03/23/2017, 12:57 PM  [facility] [address]  [address] [place], [place], [address] Phone: [phone]   Fax:  [phone]  Name: CAMARYN LUMBERT MRN: [id number] Date of Birth: 1950/10/11

## 2017-03-23 NOTE — Patient Instructions (Addendum)
Achilles / Gastroc, Standing    Stand, right foot behind, heel on floor and turned slightly out, leg straight, forward leg bent. Move hips forward. Hold __30_ seconds. Repeat _3__ times per session. Do _2__ sessions per day.  [facility]    Holding a chair for balance, slowly bend knees. Keep both feet on the floor. Repeat __10__ times. Do _2___ sessions per day.  Forward    Facing step, place one leg on step, flexed at hip. Step up slowly, bringing hips in line with knee and shoulder. Bring other foot onto step. Reverse process to step back down. Repeat with other leg. Do __10__ repetitions, _1-2___ sets. 1-2 times/day.   Strengthening: Hip Abduction - Resisted    Stand tall, extend leg out from side, leading out with your heel. Repeat __10__ times per set. Do ___1-2_ sets per session. Do __1-2__ sessions per day.   Strengthening: Hip Abductor - Resisted    With band looped around both legs above knees, push one leg out to side while holding opposite leg still, slowly return to midline. Repeat with the opposite leg Repeat __10__ times per set. Do __1-2__ sets per session. Do __1_ sessions per day.   Wrist Flexor Stretch    Sitting with elbows on table and palms together, slowly lower wrists to table until stretch is felt. Keep palms together throughout the stretch. Hold __20-30__ seconds. Relax. Repeat ___3_ times per set. Do __2-3__ sessions per day.    Wrist Flexor Stretch hold across palm not just fingers      Keeping elbow straight, grasp left hand and slowly bend wrist back until stretch is felt. Hold __20-30__ seconds. Relax. Repeat __2-3__ times per set. Do __2-3__ sessions per day.  Straighten tip joint of left index finger.  Hold 10-20 sec 2-3 reps   Towel Roll Squeeze (basketball squeeze)    With right forearm resting on surface, gently squeeze towel. 2 minutes squeeze and hold for 2-3 seconds the relax Do __2__ sessions per day.   Finger  Abduction: Resisted    With rubber band around __each two ______ fingers of right hand, gently spread fingers apart. Repeat __10-20__ times per set. Do ___1-2_ sessions per day.

## 2017-03-28 ENCOUNTER — Ambulatory Visit: Payer: Medicare Other | Admitting: Sports Medicine

## 2017-03-28 ENCOUNTER — Encounter: Payer: Self-pay | Admitting: Sports Medicine

## 2017-03-28 DIAGNOSIS — M170 Bilateral primary osteoarthritis of knee: Secondary | ICD-10-CM | POA: Diagnosis not present

## 2017-03-28 NOTE — Progress Notes (Signed)
Subjective:    CC: Follow-up  HPI: This is a pleasant 67 year old female, she has bilateral knee osteoarthritis, here for further evaluation, she is never had an injection, analgesics, therapy have been only minimally efficacious.  Pain is moderate, persistent, localized to the medial joint line bilaterally.  No radiation.  No mechanical symptoms, no constitutional symptoms, no trauma.  I reviewed the past medical history, family history, social history, surgical history, and allergies today and no changes were needed.  Please see the problem list section below in epic for further details.  Past Medical History: Past Medical History:  Diagnosis Date  . ANEMIA 11/12/2007   Qualifier: Diagnosis of  By: Briscoe Burns CMA, Alvy Beal    . Anxiety   . Arthritis   . ASTHMA NOS W/ACUTE EXACERBATION 01/13/2010   Qualifier: Diagnosis of  By: Yetta Barre MD, Bernadene Bell.   . BIPOLAR DISORDER UNSPECIFIED 10/29/2007   Qualifier: Diagnosis of  By: Yetta Barre MD, Bernadene Bell.   Marland Kitchen COMMON MIGRAINE 12/29/2008   Qualifier: Diagnosis of  By: Yetta Barre MD, Bernadene Bell.   Marland Kitchen GERD 07/28/2009   Qualifier: Diagnosis of  By: Felicity Coyer MD, Raenette Rover HYPERLIPIDEMIA 10/29/2007   Qualifier: Diagnosis of  By: Yetta Barre MD, Bernadene Bell.   . HYPERTENSION, BENIGN ESSENTIAL 10/29/2007   Qualifier: Diagnosis of  By: Yetta Barre MD, Bernadene Bell.   . OSTEOPENIA 10/30/2007   Qualifier: Diagnosis of  By: Yetta Barre MD, Bernadene Bell.   . OVERACTIVE BLADDER 02/21/2008   Qualifier: Diagnosis of  By: Jonny Ruiz MD, Len Blalock   . SLEEP APNEA 12/31/2009   Qualifier: Diagnosis of  By: Yetta Barre MD, Bernadene Bell.    Past Surgical History: Past Surgical History:  Procedure Laterality Date  . ABDOMINAL HYSTERECTOMY    . BREAST SURGERY  1980   implants bilateral  . CARPAL TUNNEL RELEASE    . CHOLECYSTECTOMY    . HERNIA REPAIR    . PAROTID ENDOSCOPY    . TUBAL LIGATION     Social History: Social History   Socioeconomic History  . Marital status: Divorced    Spouse name: None  . Number of  children: None  . Years of education: None  . Highest education level: None  Social Needs  . Financial resource strain: None  . Food insecurity - worry: None  . Food insecurity - inability: None  . Transportation needs - medical: None  . Transportation needs - non-medical: None  Occupational History  . None  Tobacco Use  . Smoking status: Never Smoker  . Smokeless tobacco: Never Used  Substance and Sexual Activity  . Alcohol use: No  . Drug use: No  . Sexual activity: Not Currently  Other Topics Concern  . None  Social History Narrative  . None   Family History: Family History  Problem Relation Age of Onset  . Heart attack Mother   . Hypertension Mother   . Diabetes Mother   . Heart attack Father   . Hypertension Father   . Alcohol abuse Maternal Uncle    Allergies: Allergies  Allergen Reactions  . Morphine Nausea And Vomiting   Medications: See med rec.  Review of Systems: No fevers, chills, night sweats, weight loss, chest pain, or shortness of breath.   Objective:    General: Well Developed, well nourished, and in no acute distress.  Neuro: Alert and oriented x3, extra-ocular muscles intact, sensation grossly intact.  HEENT: Normocephalic, atraumatic, pupils equal round reactive to light, neck supple, no masses, no lymphadenopathy, thyroid  nonpalpable.  Skin: Warm and dry, no rashes. Cardiac: Regular rate and rhythm, no murmurs rubs or gallops, no lower extremity edema.  Respiratory: Clear to auscultation bilaterally. Not using accessory muscles, speaking in full sentences. Bilateral knees: Normal to inspection with no erythema or effusion or obvious bony abnormalities. Tender to palpation at the medial joint line bilaterally ROM normal in flexion and extension and lower leg rotation. Ligaments with solid consistent endpoints including ACL, PCL, LCL, MCL. Negative Mcmurray's and provocative meniscal tests. Non painful patellar compression. Patellar and  quadriceps tendons unremarkable. Hamstring and quadriceps strength is normal.  Procedure: Real-time Ultrasound Guided Injection of left knee Device: GE Logiq E  Verbal informed consent obtained.  Time-out conducted.  Noted no overlying erythema, induration, or other signs of local infection.  Skin prepped in a sterile fashion.  Local anesthesia: Topical Ethyl chloride.  With sterile technique and under real time ultrasound guidance: 1 cc Kenalog 40, 2 cc lidocaine, 2 cc bupivacaine injected easily, syringe switched and 30 mg/2 mL of OrthoVisc (sodium hyaluronate) in a prefilled syringe was injected easily into the knee through a 22-gauge needle. Completed without difficulty  Pain immediately resolved suggesting accurate placement of the medication.  Advised to call if fevers/chills, erythema, induration, drainage, or persistent bleeding.  Images permanently stored and available for review in the ultrasound unit.  Impression: Technically successful ultrasound guided injection.  Procedure: Real-time Ultrasound Guided Injection of right knee Device: GE Logiq E  Verbal informed consent obtained.  Time-out conducted.  Noted no overlying erythema, induration, or other signs of local infection.  Skin prepped in a sterile fashion.  Local anesthesia: Topical Ethyl chloride.  With sterile technique and under real time ultrasound guidance: 1 cc Kenalog 40, 2 cc lidocaine, 2 cc bupivacaine injected easily, syringe switched and 30 mg/2 mL of OrthoVisc (sodium hyaluronate) in a prefilled syringe was injected easily into the knee through a 22-gauge needle. Completed without difficulty  Pain immediately resolved suggesting accurate placement of the medication.  Advised to call if fevers/chills, erythema, induration, drainage, or persistent bleeding.  Images permanently stored and available for review in the ultrasound unit.  Impression: Technically successful ultrasound guided injection.  Impression  and Recommendations:    Primary osteoarthritis of both knees Steroid and Orthovisc injection #1 of 4 into both knees, return in 1 week for #2 of 4.  ___________________________________________ Ihor Austinhomas J. Benjamin Stainhekkekandam, M.D., ABFM., CAQSM. Primary Care and Sports Medicine [facility]  Adjunct Instructor of Family Medicine  [employer]

## 2017-03-28 NOTE — Assessment & Plan Note (Signed)
Steroid and Orthovisc injection #1 of 4 into both knees, return in 1 week for #2 of 4.

## 2017-03-30 ENCOUNTER — Encounter: Payer: Self-pay | Admitting: Physical Therapy

## 2017-04-04 ENCOUNTER — Ambulatory Visit: Payer: Medicare Other | Admitting: Sports Medicine

## 2017-04-04 ENCOUNTER — Ambulatory Visit: Payer: Medicare Other | Admitting: Rehabilitative and Restorative Service Providers"

## 2017-04-04 DIAGNOSIS — M6281 Muscle weakness (generalized): Secondary | ICD-10-CM

## 2017-04-04 DIAGNOSIS — M79645 Pain in left finger(s): Secondary | ICD-10-CM

## 2017-04-04 DIAGNOSIS — M25561 Pain in right knee: Secondary | ICD-10-CM

## 2017-04-04 DIAGNOSIS — M25562 Pain in left knee: Secondary | ICD-10-CM | POA: Diagnosis not present

## 2017-04-04 DIAGNOSIS — R29898 Other symptoms and signs involving the musculoskeletal system: Secondary | ICD-10-CM

## 2017-04-04 DIAGNOSIS — G8929 Other chronic pain: Secondary | ICD-10-CM

## 2017-04-04 DIAGNOSIS — M170 Bilateral primary osteoarthritis of knee: Secondary | ICD-10-CM

## 2017-04-04 NOTE — Assessment & Plan Note (Signed)
Orthovisc No. 2 of 4 into both knees, return in 1 week for #3 

## 2017-04-04 NOTE — Therapy (Signed)
[facility] [place] [address] [place], [place], [address] Phone: [phone]   Fax:  [phone]  Physical Therapy Treatment  Patient Details  Name: Donna Sexton MRN: [id number] Date of Birth: 1950/08/15 Referring Provider: Dr Benjamin Stain   Encounter Date: 04/04/2017  PT End of Session - 04/04/17 1100    Visit Number  3    Number of Visits  12    Date for PT Re-Evaluation  04/26/17    PT Start Time  1058    PT Stop Time  1148    PT Time Calculation (min)  50 min    Activity Tolerance  Patient tolerated treatment well       Past Medical History:  Diagnosis Date  . ANEMIA 11/12/2007   Qualifier: Diagnosis of  By: Briscoe Burns CMA, Alvy Beal    . Anxiety   . Arthritis   . ASTHMA NOS W/ACUTE EXACERBATION 01/13/2010   Qualifier: Diagnosis of  By: Yetta Barre MD, Bernadene Bell.   . BIPOLAR DISORDER UNSPECIFIED 10/29/2007   Qualifier: Diagnosis of  By: Yetta Barre MD, Bernadene Bell.   Marland Kitchen COMMON MIGRAINE 12/29/2008   Qualifier: Diagnosis of  By: Yetta Barre MD, Bernadene Bell.   Marland Kitchen GERD 07/28/2009   Qualifier: Diagnosis of  By: Felicity Coyer MD, Raenette Rover HYPERLIPIDEMIA 10/29/2007   Qualifier: Diagnosis of  By: Yetta Barre MD, Bernadene Bell.   . HYPERTENSION, BENIGN ESSENTIAL 10/29/2007   Qualifier: Diagnosis of  By: Yetta Barre MD, Bernadene Bell.   . OSTEOPENIA 10/30/2007   Qualifier: Diagnosis of  By: Yetta Barre MD, Bernadene Bell.   . OVERACTIVE BLADDER 02/21/2008   Qualifier: Diagnosis of  By: Jonny Ruiz MD, Len Blalock   . SLEEP APNEA 12/31/2009   Qualifier: Diagnosis of  By: Yetta Barre MD, Bernadene Bell.     Past Surgical History:  Procedure Laterality Date  . ABDOMINAL HYSTERECTOMY    . BREAST SURGERY  1980   implants bilateral  . CARPAL TUNNEL RELEASE    . CHOLECYSTECTOMY    . HERNIA REPAIR    . PAROTID ENDOSCOPY    . TUBAL LIGATION      There were no vitals filed for this visit.  Subjective Assessment - 04/04/17 1102    Subjective  Patient received second injections in both knees today. Patient  reports that she is having less pain getting in and out of the floor. Working on stretching and strengthening for hand. Has not had any triggering since she was last here for therapy.     Currently in Pain?  No/denies    Pain Score  0         [facility] PT Assessment - 04/04/17 0001      Assessment   Medical Diagnosis  Bilat knee pain     Referring Provider  Dr Benjamin Stain    Onset Date/Surgical Date  06/10/16 arthritic pain for several years     Hand Dominance  Right    Next MD Visit  03/15/17    Prior Therapy  none       AROM   Right Knee Extension  -4    Left Knee Extension  0      Strength   Right Hip ABduction  -- 5-/5    Left Hip Extension  -- 5-/5    Left Hip ABduction  -- 5-/5      Flexibility   Hamstrings  tight Rt 78 deg; Lt 82 deg     Quadriceps  Rt 110 deg; Lt 117 deg in  prone     ITB  tight Rt > Lt     Piriformis  tight Rt > Lt             No data recorded       [facility] Adult PT Treatment/Exercise - 04/04/17 0001      Knee/Hip Exercises: Stretches   Passive Hamstring Stretch  Right;Left;2 reps;30 seconds supine with strap     Quad Stretch  Left;Both;2 reps;30 seconds    ITB Stretch  Right;Left;2 reps;30 seconds supine with strap       Knee/Hip Exercises: Aerobic   Recumbent Bike  L2 x 6 min       Knee/Hip Exercises: Standing   Forward Step Up  Right;Left;10 reps;Step Height: 4";Hand Hold: 2    SLS  20 sec x 5 reps each LE        Knee/Hip Exercises: Supine   Bridges  Strengthening;Right;Left;10 reps    Other Supine Knee/Hip Exercises  clam holding one LE still moving opposite x 10 reps blue TB       Moist Heat Therapy   Number Minutes Moist Heat  15 Minutes    Moist Heat Location  Knee bilat knees              PT Education - 04/04/17 1143    Education provided  Yes    Education Details  HEP     Person(s) Educated  Patient    Methods  Explanation;Demonstration;Tactile cues;Verbal cues;Handout    Comprehension  Verbalized  understanding;Returned demonstration;Verbal cues required;Tactile cues required          PT Long Term Goals - 04/04/17 1101      PT LONG TERM GOAL #1   Title  Improve tissue extensibility through LE's with patient to demonstrate increased quad stretch prone to 95-105 degrees bilat 04/26/17    Time  6    Period  Weeks    Status  On-going      PT LONG TERM GOAL #2   Title  Increased strength bilat LE's to 5-/5 to 5/5 with resistive testing 04/26/17    Time  6    Period  Weeks    Status  On-going      PT LONG TERM GOAL #3   Title  Patient demonstrates ability to move from floor to standing with appropriate assist independently 04/26/17    Time  6    Period  Weeks    Status  On-going      PT LONG TERM GOAL #4   Title  Independent in HEP 04/26/17    Time  6    Period  Weeks    Status  On-going      PT LONG TERM GOAL #5   Title  Improve FOTO to </= 39% limitation 04/26/17    Time  6    Period  Weeks    Status  On-going            Plan - 04/04/17 1115    Clinical Impression Statement  Donna Sexton reports good improvement in triggering of Lt hand. She is working on exercises at home. Knees are feeling better. She had second injections in both knees today. Progressing with mobility and strength. Gradually progressing toward goals of therapy.     Rehab Potential  Good    Clinical Impairments Affecting Rehab Potential  chronic knee pain; arthritic changes     PT Frequency  2x / week    PT Duration  6 weeks  PT Treatment/Interventions  Patient/family education;ADLs/Self Care Home Management;Cryotherapy;Electrical Stimulation;Iontophoresis 4mg /ml Dexamethasone;Moist Heat;Ultrasound;Dry needling;Manual techniques;Neuromuscular re-education;Therapeutic activities;Therapeutic exercise    PT Next Visit Plan  review HEP; add hip flexor stretch; progress LE strengthening     Consulted and Agree with Plan of Care  Patient       Patient will benefit from skilled therapeutic intervention  in order to improve the following deficits and impairments:  Postural dysfunction, Improper body mechanics, Pain, Increased fascial restricitons, Increased muscle spasms, Decreased range of motion, Decreased mobility, Decreased strength, Decreased activity tolerance  Visit Diagnosis: Chronic pain of left knee  Chronic pain of right knee  Pain in left finger(s)  Muscle weakness (generalized)  Other symptoms and signs involving the musculoskeletal system     Problem List Patient Active Problem List   Diagnosis Date Noted  . Trigger finger, left little finger 03/15/2017  . Primary osteoarthritis of both knees 01/23/2017  . Localized, primary osteoarthritis of hand, left 01/23/2017  . Lumbar radiculitis 09/07/2015  . IBS (irritable bowel syndrome) 08/07/2015  . Elevated LFTs 05/18/2015  . Hyperglycemia 05/12/2015  . Lumbago 11/05/2014  . Basal cell papilloma 09/11/2014  . Dyspnea 09/04/2014  . Nausea without vomiting 04/24/2014  . Restless leg 04/16/2014  . Muscle spasm 02/27/2014  . Overactive bladder 04/09/2013  . Abnormal mammogram 06/22/2012  . Bipolar disorder (HCC) 09/29/2011  . Hyperlipidemia 09/29/2011  . Essential hypertension 09/29/2011  . Obesity (BMI 30.0-34.9) 09/26/2011  . Telogen effluvium 08/16/2011  . Calcium blood increased 01/11/2011  . Anxiety state 10/04/2010  . Clinical depression 10/04/2010  . Anal bleeding 10/04/2010  . Cannot sleep 10/04/2010  . GERD 07/28/2009  . COMMON MIGRAINE 12/29/2008  . OVERACTIVE BLADDER 02/21/2008  . OSTEOPENIA 10/30/2007    Bindu Docter Rober MinionP Krithik Mapel PT, MPH  04/04/2017, 11:46 AM  [facility] [address] [place], [place], [address] Phone: [phone]   Fax:  [phone]  Name: Donna Sexton MRN: [id number] Date of Birth: Sep 25, 1950

## 2017-04-04 NOTE — Patient Instructions (Addendum)
Bridging    Slowly raise buttocks from floor, keeping core  tight. Repeat _10___ times per set. Do _1-3___ sets per session. Do _1___ sessions per day.   Balance: Unilateral    Attempt to balance on left leg, eyes open. Hold ___20-60_ seconds. Repeat __4-5__ times per set. Do ____ sets per session. Do __1-2__ sessions per day. Perform exercise with eyes closed.

## 2017-04-04 NOTE — Progress Notes (Signed)
   Procedure: Real-time Ultrasound Guided Injection of right knee Device: GE Logiq E  Verbal informed consent obtained.  Time-out conducted.  Noted no overlying erythema, induration, or other signs of local infection.  Skin prepped in a sterile fashion.  Local anesthesia: Topical Ethyl chloride.  With sterile technique and under real time ultrasound guidance: 30 mg/2 mL of OrthoVisc (sodium hyaluronate) in a prefilled syringe was injected easily into the knee through a 22-gauge needle. Completed without difficulty  Pain immediately resolved suggesting accurate placement of the medication.  Advised to call if fevers/chills, erythema, induration, drainage, or persistent bleeding.  Images permanently stored and available for review in the ultrasound unit.  Impression: Technically successful ultrasound guided injection.  Procedure: Real-time Ultrasound Guided Injection of left knee Device: GE Logiq E  Verbal informed consent obtained.  Time-out conducted.  Noted no overlying erythema, induration, or other signs of local infection.  Skin prepped in a sterile fashion.  Local anesthesia: Topical Ethyl chloride.  With sterile technique and under real time ultrasound guidance: 30 mg/2 mL of OrthoVisc (sodium hyaluronate) in a prefilled syringe was injected easily into the knee through a 22-gauge needle. Completed without difficulty  Pain immediately resolved suggesting accurate placement of the medication.  Advised to call if fevers/chills, erythema, induration, drainage, or persistent bleeding.  Images permanently stored and available for review in the ultrasound unit.  Impression: Technically successful ultrasound guided injection.  

## 2017-04-11 ENCOUNTER — Ambulatory Visit: Payer: Self-pay | Admitting: Sports Medicine

## 2017-04-12 ENCOUNTER — Encounter: Payer: Self-pay | Admitting: Rehabilitative and Restorative Service Providers"

## 2017-04-12 ENCOUNTER — Ambulatory Visit: Payer: Medicare Other | Admitting: Rehabilitative and Restorative Service Providers"

## 2017-04-12 ENCOUNTER — Ambulatory Visit: Payer: Medicare Other | Admitting: Sports Medicine

## 2017-04-12 ENCOUNTER — Encounter: Payer: Self-pay | Admitting: Sports Medicine

## 2017-04-12 DIAGNOSIS — G8929 Other chronic pain: Secondary | ICD-10-CM

## 2017-04-12 DIAGNOSIS — M25562 Pain in left knee: Secondary | ICD-10-CM | POA: Diagnosis not present

## 2017-04-12 DIAGNOSIS — M170 Bilateral primary osteoarthritis of knee: Secondary | ICD-10-CM | POA: Diagnosis not present

## 2017-04-12 DIAGNOSIS — M25561 Pain in right knee: Secondary | ICD-10-CM | POA: Diagnosis not present

## 2017-04-12 NOTE — Progress Notes (Signed)
   Procedure: Real-time Ultrasound Guided Injection of right knee Device: GE Logiq E  Verbal informed consent obtained.  Time-out conducted.  Noted no overlying erythema, induration, or other signs of local infection.  Skin prepped in a sterile fashion.  Local anesthesia: Topical Ethyl chloride.  With sterile technique and under real time ultrasound guidance: 30 mg/2 mL of OrthoVisc (sodium hyaluronate) in a prefilled syringe was injected easily into the knee through a 22-gauge needle. Completed without difficulty  Pain immediately resolved suggesting accurate placement of the medication.  Advised to call if fevers/chills, erythema, induration, drainage, or persistent bleeding.  Images permanently stored and available for review in the ultrasound unit.  Impression: Technically successful ultrasound guided injection.  Procedure: Real-time Ultrasound Guided Injection of left knee Device: GE Logiq E  Verbal informed consent obtained.  Time-out conducted.  Noted no overlying erythema, induration, or other signs of local infection.  Skin prepped in a sterile fashion.  Local anesthesia: Topical Ethyl chloride.  With sterile technique and under real time ultrasound guidance: 30 mg/2 mL of OrthoVisc (sodium hyaluronate) in a prefilled syringe was injected easily into the knee through a 22-gauge needle. Completed without difficulty  Pain immediately resolved suggesting accurate placement of the medication.  Advised to call if fevers/chills, erythema, induration, drainage, or persistent bleeding.  Images permanently stored and available for review in the ultrasound unit.  Impression: Technically successful ultrasound guided injection.  

## 2017-04-12 NOTE — Assessment & Plan Note (Signed)
Orthovisc No. 3 of 4 both knees, return in one week for #4 of 4 both knees.

## 2017-04-12 NOTE — Therapy (Addendum)
[facility] [place] [address] [place], [place], [address] Phone: [phone]   Fax:  [phone]  Physical Therapy Treatment  Patient Details  Name: Donna Sexton MRN: [id number] Date of Birth: 24-Mar-1950 Referring Provider: Dr Benjamin Stain   Encounter Date: 04/12/2017  PT End of Session - 04/12/17 1555    Visit Number  4    Number of Visits  12    Date for PT Re-Evaluation  04/26/17    PT Start Time  1515    PT Stop Time  1600    PT Time Calculation (min)  45 min    Activity Tolerance  Patient tolerated treatment well       Past Medical History:  Diagnosis Date  . ANEMIA 11/12/2007   Qualifier: Diagnosis of  By: Briscoe Burns CMA, Alvy Beal    . Anxiety   . Arthritis   . ASTHMA NOS W/ACUTE EXACERBATION 01/13/2010   Qualifier: Diagnosis of  By: Yetta Barre MD, Bernadene Bell.   . BIPOLAR DISORDER UNSPECIFIED 10/29/2007   Qualifier: Diagnosis of  By: Yetta Barre MD, Bernadene Bell.   Marland Kitchen COMMON MIGRAINE 12/29/2008   Qualifier: Diagnosis of  By: Yetta Barre MD, Bernadene Bell.   Marland Kitchen GERD 07/28/2009   Qualifier: Diagnosis of  By: Felicity Coyer MD, Raenette Rover HYPERLIPIDEMIA 10/29/2007   Qualifier: Diagnosis of  By: Yetta Barre MD, Bernadene Bell.   . HYPERTENSION, BENIGN ESSENTIAL 10/29/2007   Qualifier: Diagnosis of  By: Yetta Barre MD, Bernadene Bell.   . OSTEOPENIA 10/30/2007   Qualifier: Diagnosis of  By: Yetta Barre MD, Bernadene Bell.   . OVERACTIVE BLADDER 02/21/2008   Qualifier: Diagnosis of  By: Jonny Ruiz MD, Len Blalock   . SLEEP APNEA 12/31/2009   Qualifier: Diagnosis of  By: Yetta Barre MD, Bernadene Bell.     Past Surgical History:  Procedure Laterality Date  . ABDOMINAL HYSTERECTOMY    . BREAST SURGERY  1980   implants bilateral  . CARPAL TUNNEL RELEASE    . CHOLECYSTECTOMY    . HERNIA REPAIR    . PAROTID ENDOSCOPY    . TUBAL LIGATION      There were no vitals filed for this visit.      [facility] PT Assessment - 04/12/17 0001      Assessment   Medical Diagnosis  Bilat knee pain     Referring  Provider  Dr Benjamin Stain    Onset Date/Surgical Date  06/10/16 arthritic pain for several years     Hand Dominance  Right    Next MD Visit  03/15/17    Prior Therapy  none       Flexibility   Hamstrings  tight Rt 90 deg; Lt 90 deg     Quadriceps  Rt 115 deg; Lt 115 deg in prone     ITB  tight Rt > Lt     Piriformis  tight Rt > Lt                    [facility] Adult PT Treatment/Exercise - 04/12/17 0001      Knee/Hip Exercises: Stretches   Passive Hamstring Stretch  Right;Left;2 reps;30 seconds supine with strap     Quad Stretch  Left;Both;2 reps;30 seconds      Knee/Hip Exercises: Aerobic   Recumbent Bike  L2 x 6 min       Knee/Hip Exercises: Standing   Hip Abduction  Stengthening;Right;Left;2 sets;10 reps;Knee straight red TB above knees     Hip Extension  Stengthening;Right;Left;2 sets;10  reps;Knee straight red TB above knees     Forward Step Up  Right;Left;10 reps;Step Height: 4";Hand Hold: 2    SLS  20 sec x 5 reps each LE      Other Standing Knee Exercises  side steps with red TB above knees 20 feet x 3 each direction       Knee/Hip Exercises: Seated   Sit to Sand  5 reps;without UE support second set 2 reps lowering slowly with PT verbal/visual cues      Knee/Hip Exercises: Supine   Bridges  Strengthening;Right;Left;10 reps    Bridges with Clamshell  Strengthening;Right;Left;2 sets;10 reps with green TB above knees     Other Supine Knee/Hip Exercises  clam holding one LE still moving opposite x 10 reps 2 sets  blue TB       Knee/Hip Exercises: Sidelying   Hip ABduction  Strengthening;Right;Left;2 sets;10 reps leading up with heel       Knee/Hip Exercises: Prone   Hip Extension  Strengthening;Right;Left;10 reps    Other Prone Exercises  glut set 5 sec x 10                  PT Long Term Goals - 04/12/17 1547      PT LONG TERM GOAL #1   Title  Improve tissue extensibility through LE's with patient to demonstrate increased quad stretch prone to  95-105 degrees bilat 04/26/17    Time  6    Period  Weeks    Status  Achieved      PT LONG TERM GOAL #2   Title  Increased strength bilat LE's to 5-/5 to 5/5 with resistive testing 04/26/17    Time  6    Period  Weeks    Status  On-going      PT LONG TERM GOAL #3   Title  Patient demonstrates ability to move from floor to standing with appropriate assist independently 04/26/17    Time  6    Period  Weeks    Status  On-going      PT LONG TERM GOAL #4   Title  Independent in HEP 04/26/17    Time  6    Period  Weeks    Status  On-going      PT LONG TERM GOAL #5   Title  Improve FOTO to </= 39% limitation 04/26/17    Time  6    Period  Weeks    Status  On-going              Patient will benefit from skilled therapeutic intervention in order to improve the following deficits and impairments:     Visit Diagnosis: Chronic pain of left knee  Chronic pain of right knee     Problem List Patient Active Problem List   Diagnosis Date Noted  . Trigger finger, left little finger 03/15/2017  . Primary osteoarthritis of both knees 01/23/2017  . Localized, primary osteoarthritis of hand, left 01/23/2017  . Lumbar radiculitis 09/07/2015  . IBS (irritable bowel syndrome) 08/07/2015  . Elevated LFTs 05/18/2015  . Hyperglycemia 05/12/2015  . Lumbago 11/05/2014  . Basal cell papilloma 09/11/2014  . Dyspnea 09/04/2014  . Nausea without vomiting 04/24/2014  . Restless leg 04/16/2014  . Muscle spasm 02/27/2014  . Overactive bladder 04/09/2013  . Abnormal mammogram 06/22/2012  . Bipolar disorder (HCC) 09/29/2011  . Hyperlipidemia 09/29/2011  . Essential hypertension 09/29/2011  . Obesity (BMI 30.0-34.9) 09/26/2011  . Telogen effluvium 08/16/2011  .  Calcium blood increased 01/11/2011  . Anxiety state 10/04/2010  . Clinical depression 10/04/2010  . Anal bleeding 10/04/2010  . Cannot sleep 10/04/2010  . GERD 07/28/2009  . COMMON MIGRAINE 12/29/2008  . OVERACTIVE BLADDER  02/21/2008  . OSTEOPENIA 10/30/2007    Dequincy Born Rober Minion PT, MPH  04/12/2017, 5:20 PM  [facility] [address] [place], [place], [address] Phone: [phone]   Fax:  [phone]  Name: Donna Sexton MRN: [id number] Date of Birth: 01-26-1950

## 2017-04-17 ENCOUNTER — Ambulatory Visit: Payer: Self-pay | Admitting: Osteopathic Medicine

## 2017-04-19 ENCOUNTER — Ambulatory Visit: Payer: Medicare Other | Admitting: Sports Medicine

## 2017-04-19 ENCOUNTER — Encounter: Payer: Self-pay | Admitting: Sports Medicine

## 2017-04-19 DIAGNOSIS — M170 Bilateral primary osteoarthritis of knee: Secondary | ICD-10-CM | POA: Diagnosis not present

## 2017-04-19 NOTE — Progress Notes (Signed)
   Procedure: Real-time Ultrasound Guided Injection of right knee Device: GE Logiq E  Verbal informed consent obtained.  Time-out conducted.  Noted no overlying erythema, induration, or other signs of local infection.  Skin prepped in a sterile fashion.  Local anesthesia: Topical Ethyl chloride.  With sterile technique and under real time ultrasound guidance: 30 mg/2 mL of OrthoVisc (sodium hyaluronate) in a prefilled syringe was injected easily into the knee through a 22-gauge needle. Completed without difficulty  Pain immediately resolved suggesting accurate placement of the medication.  Advised to call if fevers/chills, erythema, induration, drainage, or persistent bleeding.  Images permanently stored and available for review in the ultrasound unit.  Impression: Technically successful ultrasound guided injection.  Procedure: Real-time Ultrasound Guided Injection of left knee Device: GE Logiq E  Verbal informed consent obtained.  Time-out conducted.  Noted no overlying erythema, induration, or other signs of local infection.  Skin prepped in a sterile fashion.  Local anesthesia: Topical Ethyl chloride.  With sterile technique and under real time ultrasound guidance: 30 mg/2 mL of OrthoVisc (sodium hyaluronate) in a prefilled syringe was injected easily into the knee through a 22-gauge needle. Completed without difficulty  Pain immediately resolved suggesting accurate placement of the medication.  Advised to call if fevers/chills, erythema, induration, drainage, or persistent bleeding.  Images permanently stored and available for review in the ultrasound unit.  Impression: Technically successful ultrasound guided injection.  

## 2017-04-19 NOTE — Assessment & Plan Note (Signed)
Orthovisc No. 4 of 4 into both knees, return as needed. 

## 2017-04-20 ENCOUNTER — Encounter: Payer: Self-pay | Admitting: Rehabilitative and Restorative Service Providers"

## 2017-04-20 ENCOUNTER — Ambulatory Visit (INDEPENDENT_AMBULATORY_CARE_PROVIDER_SITE_OTHER): Payer: Medicare Other | Admitting: Rehabilitative and Restorative Service Providers"

## 2017-04-20 DIAGNOSIS — M25561 Pain in right knee: Secondary | ICD-10-CM

## 2017-04-20 DIAGNOSIS — M79645 Pain in left finger(s): Secondary | ICD-10-CM

## 2017-04-20 DIAGNOSIS — R29898 Other symptoms and signs involving the musculoskeletal system: Secondary | ICD-10-CM

## 2017-04-20 DIAGNOSIS — M6281 Muscle weakness (generalized): Secondary | ICD-10-CM

## 2017-04-20 DIAGNOSIS — G8929 Other chronic pain: Secondary | ICD-10-CM

## 2017-04-20 DIAGNOSIS — M25562 Pain in left knee: Secondary | ICD-10-CM

## 2017-04-20 NOTE — Therapy (Signed)
[place], [place], [address] Phone: [phone]   Fax:  [phone]  Physical Therapy Treatment  Patient Details  Name: FREDNA STRICKER MRN: [id number] Date of Birth: 1950-12-18 Referring Provider: Dr Dianah Field   Encounter Date: 04/20/2017  PT End of Session - 04/20/17 1046    Visit Number  5    Number of Visits  12    Date for PT Re-Evaluation  04/26/17    PT Start Time  1016    PT Stop Time  1100    PT Time Calculation (min)  44 min    Activity Tolerance  Patient tolerated treatment well       Past Medical History:  Diagnosis Date  . ANEMIA 11/12/2007   Qualifier: Diagnosis of  By: Tiney Rouge CMA, Ellison Hughs    . Anxiety   . Arthritis   . ASTHMA NOS W/ACUTE EXACERBATION 01/13/2010   Qualifier: Diagnosis of  By: Ronnald Ramp MD, Arvid Right.   . BIPOLAR DISORDER UNSPECIFIED 10/29/2007   Qualifier: Diagnosis of  By: Ronnald Ramp MD, Arvid Right.   Marland Kitchen COMMON MIGRAINE 12/29/2008   Qualifier: Diagnosis of  By: Ronnald Ramp MD, Arvid Right.   Marland Kitchen GERD 07/28/2009   Qualifier: Diagnosis of  By: Asa Lente MD, Jannifer Rodney HYPERLIPIDEMIA 10/29/2007   Qualifier: Diagnosis of  By: Ronnald Ramp MD, Arvid Right.   . HYPERTENSION, BENIGN ESSENTIAL 10/29/2007   Qualifier: Diagnosis of  By: Ronnald Ramp MD, [place] OSTEOPENIA 10/30/2007   Qualifier: Diagnosis of  By: Ronnald Ramp MD, [place] BLADDER 02/21/2008   Qualifier: Diagnosis of  By: Jenny Reichmann MD, Hunt Oris   . SLEEP APNEA 12/31/2009   Qualifier: Diagnosis of  By: Ronnald Ramp MD, Arvid Right.     Past Surgical History:  Procedure Laterality Date  . ABDOMINAL HYSTERECTOMY    . BREAST SURGERY  1980   implants bilateral  . CARPAL TUNNEL RELEASE    . CHOLECYSTECTOMY    . HERNIA REPAIR    . PAROTID ENDOSCOPY    . TUBAL LIGATION      There were no vitals filed for this visit.  Subjective Assessment - 04/20/17 1049    Subjective  Just received 4th injections in knees yesterday. Knees are  feeling better with less pain. She is working on her exercises at home and can tell she is improving.     Currently in Pain?  No/denies         [facility] PT Assessment - 04/20/17 0001      Assessment   Medical Diagnosis  Bilat knee pain     Referring Provider  Dr Dianah Field    Onset Date/Surgical Date  06/10/16 arthritic pain for several years     Hand Dominance  Right    Next MD Visit  PRN    Prior Therapy  none       Observation/Other Assessments   Focus on Therapeutic Outcomes (FOTO)   38% limitation       Strength   Right Hip ABduction  5/5    Left Hip Extension  5/5    Left Hip ABduction  5/5      Flexibility   Hamstrings  tight Rt 100 deg; Lt 100 deg     ITB  WNL's    Piriformis  WNL's                    [facility] Adult PT Treatment/Exercise - 04/20/17 0001  Knee/Hip Exercises: Stretches   Passive Hamstring Stretch  Right;Left;2 reps;30 seconds supine with strap     ITB Stretch  Right;Left;2 reps;30 seconds      Knee/Hip Exercises: Aerobic   Recumbent Bike  L3 x 6 min       Knee/Hip Exercises: Standing   Lateral Step Up  Right;Left;2 sets;10 reps    Forward Step Up  Right;Left;10 reps;Step Height: 4";Hand Hold: 2    Step Down  Right;Left;2 sets;10 reps    SLS  20 sec x 5 reps each LE        Knee/Hip Exercises: Seated   Sit to Sand  5 reps;without UE support second set 2 reps lowering slowly with PT verbal/visual cues      Knee/Hip Exercises: Supine   Bridges  Strengthening;Right;Left;10 reps    Bridges with Clamshell  Strengthening;Right;Left;2 sets;10 reps with green TB above knees     Other Supine Knee/Hip Exercises  clam holding one LE still moving opposite x 10 reps 2 sets  blue TB       Knee/Hip Exercises: Sidelying   Clams  blue TB 10 reps x 2              PT Education - 04/20/17 1104    Education provided  Yes    Education Details  HEP     Person(s) Educated  Patient    Methods  Explanation;Demonstration;Tactile cues;Verbal  cues;Handout    Comprehension  Verbalized understanding;Returned demonstration;Verbal cues required;Tactile cues required          PT Long Term Goals - 04/20/17 1047      PT LONG TERM GOAL #1   Title  Improve tissue extensibility through LE's with patient to demonstrate increased quad stretch prone to 95-105 degrees bilat 04/26/17    Time  6    Period  Weeks    Status  Achieved      PT LONG TERM GOAL #2   Title  Increased strength bilat LE's to 5-/5 to 5/5 with resistive testing 04/26/17    Time  6    Period  Weeks    Status  Achieved      PT LONG TERM GOAL #3   Title  Patient demonstrates ability to move from floor to standing with appropriate assist independently 04/26/17    Time  6    Period  Weeks    Status  Achieved      PT LONG TERM GOAL #4   Title  Independent in HEP 04/26/17    Time  6    Period  Weeks    Status  Achieved      PT LONG TERM GOAL #5   Title  Improve FOTO to </= 39% limitation 04/26/17    Time  6    Period  Weeks    Status  Achieved            Plan - 04/20/17 1047    Clinical Impression Statement  Increasing mobility, ROM, strength bilat LE's with less pain. Patient has completed series of 4 injections for each knee. Progressing well toward goals of therapy.     Rehab Potential  Good    PT Frequency  2x / week    PT Duration  6 weeks    PT Treatment/Interventions  Patient/family education;ADLs/Self Care Home Management;Cryotherapy;Electrical Stimulation;Iontophoresis 54m/ml Dexamethasone;Moist Heat;Ultrasound;Dry needling;Manual techniques;Neuromuscular re-education;Therapeutic activities;Therapeutic exercise    PT Next Visit Plan  review HEP; add hip flexor stretch; progress LE strengthening  Consulted and Agree with Plan of Care  Patient       Patient will benefit from skilled therapeutic intervention in order to improve the following deficits and impairments:  Postural dysfunction, Improper body mechanics, Pain, Increased fascial  restricitons, Increased muscle spasms, Decreased range of motion, Decreased mobility, Decreased strength, Decreased activity tolerance  Visit Diagnosis: Chronic pain of left knee  Chronic pain of right knee  Pain in left finger(s)  Muscle weakness (generalized)  Other symptoms and signs involving the musculoskeletal system     Problem List Patient Active Problem List   Diagnosis Date Noted  . Trigger finger, left little finger 03/15/2017  . Primary osteoarthritis of both knees 01/23/2017  . Localized, primary osteoarthritis of hand, left 01/23/2017  . Lumbar radiculitis 09/07/2015  . IBS (irritable bowel syndrome) 08/07/2015  . Elevated LFTs 05/18/2015  . Hyperglycemia 05/12/2015  . Lumbago 11/05/2014  . Basal cell papilloma 09/11/2014  . Dyspnea 09/04/2014  . Nausea without vomiting 04/24/2014  . Restless leg 04/16/2014  . Muscle spasm 02/27/2014  . Overactive bladder 04/09/2013  . Abnormal mammogram 06/22/2012  . Bipolar disorder ([place]) 09/29/2011  . Hyperlipidemia 09/29/2011  . Essential hypertension 09/29/2011  . Obesity (BMI 30.0-34.9) 09/26/2011  . Telogen effluvium 08/16/2011  . Calcium blood increased 01/11/2011  . Anxiety state 10/04/2010  . Clinical depression 10/04/2010  . Anal bleeding 10/04/2010  . Cannot sleep 10/04/2010  . GERD 07/28/2009  . COMMON MIGRAINE 12/29/2008  . OVERACTIVE BLADDER 02/21/2008  . OSTEOPENIA 10/30/2007    Lakeria Starkman Nilda Simmer PT, MPH  04/20/2017, 1:16 PM  [facility] [place], [place], [address] Phone: [phone]   Fax:  [phone]  Name: JAYMIE MCKIDDY MRN: [id number] Date of Birth: 19-Sep-1950  PHYSICAL THERAPY DISCHARGE SUMMARY  Visits from Start of Care: 5  Current functional level related to goals / functional outcomes: See progress note    Remaining deficits: Should continue stretching and strengthening    Education / Equipment: HEP   Plan: Patient agrees to discharge.  Patient goals were met. Patient is being discharged due to meeting the stated rehab goals.  ?????     Maxxon Schwanke P. Helene Kelp PT, MPH 04/20/17 1:17 PM

## 2017-04-20 NOTE — Patient Instructions (Signed)
Step-Down: Forward    Step down forward. Keep pelvis neutral. Do __10_ times, each leg, _1__ times per day.   Step: Down, Lateral    Stand sideways on step. Step down touching heel to floor then push back up  Repeat _10___ times per set. Do _2-3___ sets per session. Do _1__ sessions per day

## 2017-04-28 ENCOUNTER — Other Ambulatory Visit: Payer: Self-pay | Admitting: Osteopathic Medicine

## 2017-04-28 DIAGNOSIS — Z1231 Encounter for screening mammogram for malignant neoplasm of breast: Secondary | ICD-10-CM

## 2017-05-10 ENCOUNTER — Ambulatory Visit (INDEPENDENT_AMBULATORY_CARE_PROVIDER_SITE_OTHER): Payer: Medicare Other | Admitting: Osteopathic Medicine

## 2017-05-10 ENCOUNTER — Encounter: Payer: Self-pay | Admitting: Osteopathic Medicine

## 2017-05-10 VITALS — BP 130/76 | HR 86 | Temp 98.2°F | Wt 202.4 lb

## 2017-05-10 DIAGNOSIS — M5416 Radiculopathy, lumbar region: Secondary | ICD-10-CM | POA: Diagnosis not present

## 2017-05-10 DIAGNOSIS — G8929 Other chronic pain: Secondary | ICD-10-CM

## 2017-05-10 DIAGNOSIS — M5442 Lumbago with sciatica, left side: Secondary | ICD-10-CM

## 2017-05-10 DIAGNOSIS — Z0000 Encounter for general adult medical examination without abnormal findings: Secondary | ICD-10-CM | POA: Diagnosis not present

## 2017-05-10 MED ORDER — HYDROCODONE-ACETAMINOPHEN 5-325 MG PO TABS: 1.0000 | ORAL_TABLET | Freq: Two times a day (BID) | ORAL | 0 refills | Status: DC | PRN

## 2017-05-10 NOTE — Progress Notes (Signed)
Subjective:   Donna Sexton is a 67 y.o. female who presents for Medicare Annual (Subsequent) preventive examination.  Review of Systems:  No concerns today        Objective:     Vitals: BP 130/76 (BP Location: Left Arm, Patient Position: Sitting, Cuff Size: Large)   Pulse 86   Temp 98.2 F (36.8 C) (Oral)   Wt 202 lb 6.4 oz (91.8 kg)   BMI 34.21 kg/m   Body mass index is 34.21 kg/m.  Advanced Directives 03/15/2017  Does Patient Have a Medical Advance Directive? Yes  Type of Estate agent of [place];Living will  Copy of Healthcare Power of Attorney in Chart? No - copy requested    Tobacco Social History   Tobacco Use  Smoking Status Never Smoker  Smokeless Tobacco Never Used     Counseling given: Not Answered   Clinical Intake: no concerns    Past Medical History:  Diagnosis Date  . ANEMIA 11/12/2007   Qualifier: Diagnosis of  By: Briscoe Burns CMA, Alvy Beal    . Anxiety   . Arthritis   . ASTHMA NOS W/ACUTE EXACERBATION 01/13/2010   Qualifier: Diagnosis of  By: Yetta Barre MD, Bernadene Bell.   . BIPOLAR DISORDER UNSPECIFIED 10/29/2007   Qualifier: Diagnosis of  By: Yetta Barre MD, Bernadene Bell.   Marland Kitchen COMMON MIGRAINE 12/29/2008   Qualifier: Diagnosis of  By: Yetta Barre MD, Bernadene Bell.   Marland Kitchen GERD 07/28/2009   Qualifier: Diagnosis of  By: Felicity Coyer MD, Raenette Rover HYPERLIPIDEMIA 10/29/2007   Qualifier: Diagnosis of  By: Yetta Barre MD, Bernadene Bell.   . HYPERTENSION, BENIGN ESSENTIAL 10/29/2007   Qualifier: Diagnosis of  By: Yetta Barre MD, Bernadene Bell.   . OSTEOPENIA 10/30/2007   Qualifier: Diagnosis of  By: Yetta Barre MD, Bernadene Bell.   . OVERACTIVE BLADDER 02/21/2008   Qualifier: Diagnosis of  By: Jonny Ruiz MD, Len Blalock   . SLEEP APNEA 12/31/2009   Qualifier: Diagnosis of  By: Yetta Barre MD, Bernadene Bell.    Past Surgical History:  Procedure Laterality Date  . ABDOMINAL HYSTERECTOMY    . BREAST SURGERY  1980   implants bilateral  . CARPAL TUNNEL RELEASE    . CHOLECYSTECTOMY    . HERNIA REPAIR    .  PAROTID ENDOSCOPY    . TUBAL LIGATION     Family History  Problem Relation Age of Onset  . Heart attack Mother   . Hypertension Mother   . Diabetes Mother   . Heart attack Father   . Hypertension Father   . Alcohol abuse Maternal Uncle    Social History   Socioeconomic History  . Marital status: Divorced    Spouse name: Not on file  . Number of children: Not on file  . Years of education: Not on file  . Highest education level: Not on file  Occupational History  . Not on file  Social Needs  . Financial resource strain: Not on file  . Food insecurity:    Worry: Not on file    Inability: Not on file  . Transportation needs:    Medical: Not on file    Non-medical: Not on file  Tobacco Use  . Smoking status: Never Smoker  . Smokeless tobacco: Never Used  Substance and Sexual Activity  . Alcohol use: No  . Drug use: No  . Sexual activity: Not Currently  Lifestyle  . Physical activity:    Days per week: Not on file    Minutes per  session: Not on file  . Stress: Not on file  Relationships  . Social connections:    Talks on phone: Not on file    Gets together: Not on file    Attends religious service: Not on file    Active member of club or organization: Not on file    Attends meetings of clubs or organizations: Not on file    Relationship status: Not on file  Other Topics Concern  . Not on file  Social History Narrative  . Not on file    Outpatient Encounter Medications as of 05/10/2017  Medication Sig  . amLODipine (NORVASC) 10 MG tablet TAKE 1 TABLET ONCE DAILY.  Marland Kitchen atorvastatin (LIPITOR) 40 MG tablet Take 1 tablet (40 mg total) by mouth daily.  . celecoxib (CELEBREX) 200 MG capsule One to 2 tablets by mouth daily as needed for pain.  . cyclobenzaprine (FLEXERIL) 5 MG tablet Take 1 tablet (5 mg total) by mouth at bedtime.  . diphenoxylate-atropine (LOMOTIL) 2.5-0.025 MG tablet TAKE 1 TABLET FOUR TIMES DAILY AS NEEDED FOR DIARRHEA/LOOSE STOOLS.  Marland Kitchen divalproex  (DEPAKOTE ER) 500 MG 24 hr tablet   . HYDROcodone-acetaminophen (NORCO/VICODIN) 5-325 MG tablet Take 1 tablet by mouth 2 (two) times daily as needed for moderate pain or severe pain. #60 for 30(thirty) days.  Marland Kitchen labetalol (NORMODYNE) 200 MG tablet TAKE 2 TABLETS IN THE MORNING AND 1 TABLET IN THE EVENING.  Marland Kitchen oxybutynin (DITROPAN) 5 MG tablet TAKE 1/2 TO 1 TABLET TWICE DAILY.  Marland Kitchen promethazine (PHENERGAN) 25 MG tablet TAKE 1 TABLET EVERY 8 HOURS AS NEEDED FOR NAUSEA AND VOMITING.  . triamcinolone cream (KENALOG) 0.1 % Apply 1 application 2 (two) times daily topically. To affected area(s) as needed  . vitamin E (VITAMIN E) 400 UNIT capsule Take 1 capsule (400 Units total) by mouth daily.  Marland Kitchen gabapentin (NEURONTIN) 400 MG capsule Take 1-2 capsules (400-800 mg total) by mouth 3 (three) times daily.   No facility-administered encounter medications on file as of 05/10/2017.     Activities of Daily Living No concerns on questionnaire  Patient Care Team: Sunnie Nielsen, DO as PCP - General (Osteopathic Medicine)    Assessment:   This is a routine wellness examination for Katesha.  Exercise Activities and Dietary recommendations    Goals    None    Healthy eating habits and exercise for weight loss Get [place] a copy of living will if desired  Fall Risk Fall Risk  09/24/2015  Falls in the past year? No   Is the patient's home free of loose throw rugs in walkways, pet beds, electrical cords, etc?   yes      Grab bars in the bathroom? In the shower       Handrails on the stairs?   yes      Adequate lighting?   yes  Timed Get Up and Go performed: no concerns   Depression Screen PHQ 2/9 Scores 05/12/2017 11/18/2016 09/24/2015  PHQ - 2 Score 0 0 0     Cognitive Function        Immunization History  Administered Date(s) Administered  . Influenza Nasal 11/05/2010  . Influenza Split 09/26/2011  . Influenza Whole 10/07/2008, 09/10/2009, 11/10/2012  . Influenza,inj,Quad PF,6+ Mos 11/18/2013,  10/14/2014, 09/21/2015  . Influenza-Unspecified 10/10/2016  . Pneumococcal Conjugate-13 07/28/2014  . Pneumococcal Polysaccharide-23 10/31/2012  . Td 01/11/2004  . Tdap 01/11/2004, 04/15/2013  . Zoster 05/22/2012    Qualifies for Shingles Vaccine? yes  Screening Tests Health  Maintenance  Topic Date Due  . INFLUENZA VACCINE  08/10/2017  . PNA vac Low Risk Adult (2 of 2 - PPSV23) 10/31/2017  . MAMMOGRAM  02/08/2018  . COLONOSCOPY  06/30/2020  . TETANUS/TDAP  04/16/2023  . DEXA SCAN  Completed  . Hepatitis C Screening  Completed    Cancer Screenings: Lung: Low Dose CT Chest recommended if Age 50-80 years, 30 pack-year currently smoking OR have quit w/in 15years. Patient does not qualify. Breast:  Up to date on Mammogram? Yes  May 10th getting this  Up to date of Bone Density/Dexa? Yes Colorectal: no need   Additional Screenings:: Hepatitis C Screening:      Plan:     Encounter for Medicare annual wellness exam  Lumbar radiculitis - Plan: HYDROcodone-acetaminophen (NORCO/VICODIN) 5-325 MG tablet, DISCONTINUED: HYDROcodone-acetaminophen (NORCO/VICODIN) 5-325 MG tablet, DISCONTINUED: HYDROcodone-acetaminophen (NORCO/VICODIN) 5-325 MG tablet  Chronic bilateral low back pain with left-sided sciatica - Plan: HYDROcodone-acetaminophen (NORCO/VICODIN) 5-325 MG tablet, DISCONTINUED: HYDROcodone-acetaminophen (NORCO/VICODIN) 5-325 MG tablet, DISCONTINUED: HYDROcodone-acetaminophen (NORCO/VICODIN) 5-325 MG tablet    I have personally reviewed and noted the following in the patient's chart:   . Medical and social history . Use of alcohol, tobacco or illicit drugs  . Current medications and supplements . Functional ability and status . Nutritional status . Physical activity . Advanced directives . List of other physicians . Hospitalizations, surgeries, and ER visits in previous 12 months . Vitals . Screenings to include cognitive, depression, and falls . Referrals and  appointments  In addition, I have reviewed and discussed with patient certain preventive protocols, quality metrics, and best practice recommendations. A written personalized care plan for preventive services as well as general preventive health recommendations were provided to patient.     Sunnie Nielsen, DO  05/10/2017

## 2017-05-11 DIAGNOSIS — M48061 Spinal stenosis, lumbar region without neurogenic claudication: Secondary | ICD-10-CM | POA: Diagnosis not present

## 2017-05-12 ENCOUNTER — Telehealth: Payer: Self-pay | Admitting: Osteopathic Medicine

## 2017-05-12 ENCOUNTER — Encounter: Payer: Self-pay | Admitting: Osteopathic Medicine

## 2017-05-12 NOTE — Telephone Encounter (Signed)
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 CDC is not recommending boosters to the general population at this time.

## 2017-05-12 NOTE — Telephone Encounter (Signed)
Called and left detailed msg on pt's ID'd voicemail. Left call back info in case of any follow up questions or concerns.

## 2017-05-12 NOTE — Telephone Encounter (Signed)
-----   Message from Sunnie Nielsen, DO sent at 05/10/2017  2:52 PM EDT ----- Measles vaccine?

## 2017-05-17 ENCOUNTER — Encounter: Payer: Self-pay | Admitting: Osteopathic Medicine

## 2017-05-18 ENCOUNTER — Other Ambulatory Visit: Payer: Self-pay | Admitting: Sports Medicine

## 2017-05-18 DIAGNOSIS — M170 Bilateral primary osteoarthritis of knee: Secondary | ICD-10-CM

## 2017-05-19 ENCOUNTER — Ambulatory Visit
Admission: RE | Admit: 2017-05-19 | Discharge: 2017-05-19 | Disposition: A | Discharge status: Home / Self Care | Payer: Medicare Other | Source: Ambulatory Visit | Attending: Osteopathic Medicine | Admitting: Osteopathic Medicine

## 2017-05-19 DIAGNOSIS — Z1231 Encounter for screening mammogram for malignant neoplasm of breast: Secondary | ICD-10-CM | POA: Diagnosis not present

## 2017-05-21 ENCOUNTER — Other Ambulatory Visit: Payer: Self-pay | Admitting: Osteopathic Medicine

## 2017-05-25 DIAGNOSIS — F331 Major depressive disorder, recurrent, moderate: Secondary | ICD-10-CM | POA: Diagnosis not present

## 2017-05-31 DIAGNOSIS — H524 Presbyopia: Secondary | ICD-10-CM | POA: Diagnosis not present

## 2017-06-01 ENCOUNTER — Other Ambulatory Visit: Payer: Self-pay | Admitting: Osteopathic Medicine

## 2017-06-08 ENCOUNTER — Other Ambulatory Visit: Payer: Self-pay | Admitting: Osteopathic Medicine

## 2017-06-19 ENCOUNTER — Other Ambulatory Visit: Payer: Self-pay | Admitting: Osteopathic Medicine

## 2017-06-19 ENCOUNTER — Other Ambulatory Visit: Payer: Self-pay | Admitting: Sports Medicine

## 2017-06-19 DIAGNOSIS — M170 Bilateral primary osteoarthritis of knee: Secondary | ICD-10-CM

## 2017-06-19 NOTE — Telephone Encounter (Signed)
[facility] for both pt's Lomotil and also the promethazine.   Lomotil last written 01-26-17 for 30 with 1 RF Promethazine last written 03-13-17 for 30 with 2 RF   Both RXs have been pended.   Please review and send if appropriate.   Thanks!

## 2017-06-29 DIAGNOSIS — F331 Major depressive disorder, recurrent, moderate: Secondary | ICD-10-CM | POA: Diagnosis not present

## 2017-07-17 ENCOUNTER — Other Ambulatory Visit: Payer: Self-pay | Admitting: Osteopathic Medicine

## 2017-07-19 ENCOUNTER — Other Ambulatory Visit: Payer: Self-pay | Admitting: Osteopathic Medicine

## 2017-07-25 ENCOUNTER — Telehealth: Payer: Self-pay

## 2017-07-25 MED ORDER — ZOSTER VAC RECOMB ADJUVANTED 50 MCG/0.5ML IM SUSR: 0.5000 mL | Freq: Once | INTRAMUSCULAR | 1 refills | Status: AC

## 2017-07-25 NOTE — Telephone Encounter (Signed)
Pt aware RX at pharmacy

## 2017-07-25 NOTE — Telephone Encounter (Signed)
Pt has Medicare- will not cover Shingrix in the office.   RX pended, please send to local pharmacy and send note back to me so that I can call pt.   Thanks!

## 2017-07-25 NOTE — Telephone Encounter (Signed)
Sent. Thanks.   

## 2017-07-26 ENCOUNTER — Other Ambulatory Visit: Payer: Self-pay | Admitting: Sports Medicine

## 2017-07-26 DIAGNOSIS — M170 Bilateral primary osteoarthritis of knee: Secondary | ICD-10-CM

## 2017-08-10 ENCOUNTER — Ambulatory Visit: Payer: Medicare Other | Admitting: Osteopathic Medicine

## 2017-08-10 ENCOUNTER — Encounter: Payer: Self-pay | Admitting: Osteopathic Medicine

## 2017-08-10 VITALS — BP 124/61 | HR 70 | Temp 98.0°F | Wt 207.2 lb

## 2017-08-10 DIAGNOSIS — M170 Bilateral primary osteoarthritis of knee: Secondary | ICD-10-CM

## 2017-08-10 DIAGNOSIS — R799 Abnormal finding of blood chemistry, unspecified: Secondary | ICD-10-CM

## 2017-08-10 DIAGNOSIS — M5416 Radiculopathy, lumbar region: Secondary | ICD-10-CM

## 2017-08-10 DIAGNOSIS — E559 Vitamin D deficiency, unspecified: Secondary | ICD-10-CM

## 2017-08-10 DIAGNOSIS — M5442 Lumbago with sciatica, left side: Secondary | ICD-10-CM | POA: Diagnosis not present

## 2017-08-10 DIAGNOSIS — G8929 Other chronic pain: Secondary | ICD-10-CM

## 2017-08-10 MED ORDER — HYDROCODONE-ACETAMINOPHEN 5-325 MG PO TABS: 1.0000 | ORAL_TABLET | Freq: Two times a day (BID) | ORAL | 0 refills | Status: DC | PRN

## 2017-08-10 NOTE — Progress Notes (Signed)
HPI: Donna Sexton is a 67 y.o. female who  has a past medical history of ANEMIA (11/12/2007), Anxiety, Arthritis, ASTHMA NOS W/ACUTE EXACERBATION (01/13/2010), BIPOLAR DISORDER UNSPECIFIED (10/29/2007), COMMON MIGRAINE (12/29/2008), GERD (07/28/2009), HYPERLIPIDEMIA (10/29/2007), HYPERTENSION, BENIGN ESSENTIAL (10/29/2007), OSTEOPENIA (10/30/2007), OVERACTIVE BLADDER (02/21/2008), and SLEEP APNEA (12/31/2009).  she presents to [facility] today, 08/10/17,  for chief complaint of:  Pain medication refill   Doing well on current pain medications for chronic low back pain and hip-knee arthritis.  PMP reviewed, consistent with medications prescribed below.  Patient prefers to receive printed prescriptions that she can fill as they come due   Past medical history, surgical history, and family history reviewed.  Current medication list and allergy/intolerance information reviewed.   (See remainder of HPI, ROS, Phys Exam below)  No results found.  No results found for this or any previous visit (from the past 72 hour(s)).   ASSESSMENT/PLAN: The primary encounter diagnosis was Chronic bilateral low back pain with left-sided sciatica. Diagnoses of Lumbar radiculitis, Primary osteoarthritis of both knees, Vitamin D deficiency, and Abnormal serum total protein level were also pertinent to this visit.  Meds ordered this encounter  Medications  . DISCONTD: HYDROcodone-acetaminophen (NORCO/VICODIN) 5-325 MG tablet    Sig: Take 1 tablet by mouth 2 (two) times daily as needed for moderate pain or severe pain. #60 for 30(thirty) days.    Dispense:  60 tablet    Refill:  0  . DISCONTD: HYDROcodone-acetaminophen (NORCO/VICODIN) 5-325 MG tablet    Sig: Take 1 tablet by mouth 2 (two) times daily as needed for moderate pain or severe pain. #60 for 30(thirty) days.    Dispense:  60 tablet    Refill:  0  . HYDROcodone-acetaminophen (NORCO/VICODIN) 5-325 MG tablet    Sig:  Take 1 tablet by mouth 2 (two) times daily as needed for moderate pain or severe pain. #60 for 30(thirty) days.    Dispense:  60 tablet    Refill:  0     Orders Placed This Encounter  Procedures  . COMPLETE METABOLIC PANEL WITH GFR  . VITAMIN D 25 Hydroxy (Vit-D Deficiency, Fractures)    Follow-up plan: Return in about 3 months (around 11/08/2017) for refill pain medications - sooner if needed .     ############################################ ############################################ ############################################ ############################################    Outpatient Encounter Medications as of 08/10/2017  Medication Sig Note  . amLODipine (NORVASC) 10 MG tablet TAKE 1 TABLET ONCE DAILY.   Marland Kitchen atorvastatin (LIPITOR) 40 MG tablet TAKE 1 TABLET EACH DAY.   . celecoxib (CELEBREX) 200 MG capsule TAKE 1-2 CAPSULES ONCE DAILY AS NEEDED FOR PAIN.   . cyclobenzaprine (FLEXERIL) 5 MG tablet Take 1 tablet (5 mg total) by mouth at bedtime. 02/15/2017: PRN  . diphenoxylate-atropine (LOMOTIL) 2.5-0.025 MG tablet TAKE 1 TABLET FOUR TIMES DAILY AS NEEDED FOR DIARRHEA/LOOSE STOOLS.   Marland Kitchen divalproex (DEPAKOTE ER) 500 MG 24 hr tablet    . gabapentin (NEURONTIN) 400 MG capsule TAKE 1 OR 2 CAPSULES THREE TIMES DAILY.   Marland Kitchen HYDROcodone-acetaminophen (NORCO/VICODIN) 5-325 MG tablet Take 1 tablet by mouth 2 (two) times daily as needed for moderate pain or severe pain. #60 for 30(thirty) days.   Marland Kitchen labetalol (NORMODYNE) 200 MG tablet TAKE 2 TABLETS IN THE MORNING AND 1 TABLET IN THE EVENING.   Marland Kitchen oxybutynin (DITROPAN) 5 MG tablet TAKE 1/2 TO 1 TABLET TWICE DAILY.   Marland Kitchen promethazine (PHENERGAN) 25 MG tablet TAKE 1 TABLET EVERY 8 HOURS AS NEEDED FOR  NAUSEA AND VOMITING.   . triamcinolone cream (KENALOG) 0.1 % Apply 1 application 2 (two) times daily topically. To affected area(s) as needed   . [DISCONTINUED] labetalol (NORMODYNE) 200 MG tablet TAKE 2 TABLETS IN THE MORNING AND 1 TABLET IN THE  EVENING. (Patient not taking: Reported on 08/10/2017)   . [DISCONTINUED] oxybutynin (DITROPAN) 5 MG tablet TAKE 1/2 TO 1 TABLET TWICE DAILY. (Patient not taking: Reported on 08/10/2017)   . [DISCONTINUED] promethazine (PHENERGAN) 25 MG tablet TAKE 1 TABLET EVERY 8 HOURS AS NEEDED FOR NAUSEA AND VOMITING. (Patient not taking: Reported on 08/10/2017)   . [DISCONTINUED] vitamin E (VITAMIN E) 400 UNIT capsule Take 1 capsule (400 Units total) by mouth daily. (Patient not taking: Reported on 08/10/2017)    No facility-administered encounter medications on file as of 08/10/2017.    Allergies  Allergen Reactions  . Morphine Nausea And Vomiting      Review of Systems:  Constitutional: No recent illness  Cardiac: No  chest pain, No  pressure  Respiratory:  No  shortness of breath. No  Cough  Gastrointestinal: No  abdominal pain, no change on bowel habits  Musculoskeletal: No new myalgia/arthralgia  Neurologic: No  weakness, No  Dizziness  Psychiatric: No  concerns with depression, No  concerns with anxiety  Exam:  BP 124/61 (BP Location: Left Arm, Patient Position: Sitting, Cuff Size: Large)   Pulse 70   Temp 98 F (36.7 C) (Oral)   Wt 207 lb 3.2 oz (94 kg)   BMI 35.02 kg/m   Constitutional: VS see above. General Appearance: alert, well-developed, well-nourished, NAD  Eyes: Normal lids and conjunctive, non-icteric sclera  Ears, Nose, Mouth, Throat: MMM, Normal external inspection ears/nares/mouth/lips/gums.  Neck: No masses, trachea midline.   Respiratory: Normal respiratory effort. no wheeze, no rhonchi, no rales  Cardiovascular: S1/S2 normal, no murmur, no rub/gallop auscultated. RRR.   Musculoskeletal: Gait normal. Symmetric and independent movement of all extremities  Neurological: Normal balance/coordination. No tremor.  Skin: warm, dry, intact.   Psychiatric: Normal judgment/insight. Normal mood and affect. Oriented x3.   Visit summary with medication list and pertinent  instructions was printed for patient to review, advised to alert us if any changes needed. All questions at time of visit were answered - patient instructed to contact office with any additional concerns. ER/RTC precautions were reviewed with the patient and understanding verbalized.   Follow-up plan: Return in about 3 months (around 11/08/2017) for refill pain medications - sooner if needed .  Note: Total time spent 15 minutes, greater than 50% of the visit was spent face-to-face counseling and coordinating care for the following: The primary encounter diagnosis was Chronic bilateral low back pain with left-sided sciatica. Diagnoses of Lumbar radiculitis, Primary osteoarthritis of both knees, Vitamin D deficiency, and Abnormal serum total protein level were also pertinent to this visit.Marland Kitchen.  Please note: voice recognition software was used to produce this document, and typos may escape review. Please contact Dr. Lyn HollingsheadAlexander for any needed clarifications.

## 2017-08-11 DIAGNOSIS — M5136 Other intervertebral disc degeneration, lumbar region: Secondary | ICD-10-CM | POA: Diagnosis not present

## 2017-08-11 DIAGNOSIS — M5416 Radiculopathy, lumbar region: Secondary | ICD-10-CM | POA: Diagnosis not present

## 2017-08-11 DIAGNOSIS — M5126 Other intervertebral disc displacement, lumbar region: Secondary | ICD-10-CM | POA: Diagnosis not present

## 2017-08-11 DIAGNOSIS — M48062 Spinal stenosis, lumbar region with neurogenic claudication: Secondary | ICD-10-CM | POA: Diagnosis not present

## 2017-08-16 DIAGNOSIS — M5136 Other intervertebral disc degeneration, lumbar region: Secondary | ICD-10-CM | POA: Diagnosis not present

## 2017-08-16 DIAGNOSIS — M48061 Spinal stenosis, lumbar region without neurogenic claudication: Secondary | ICD-10-CM | POA: Diagnosis not present

## 2017-08-16 DIAGNOSIS — M5126 Other intervertebral disc displacement, lumbar region: Secondary | ICD-10-CM | POA: Diagnosis not present

## 2017-08-16 DIAGNOSIS — M5416 Radiculopathy, lumbar region: Secondary | ICD-10-CM | POA: Diagnosis not present

## 2017-08-16 DIAGNOSIS — M47816 Spondylosis without myelopathy or radiculopathy, lumbar region: Secondary | ICD-10-CM | POA: Diagnosis not present

## 2017-08-16 DIAGNOSIS — M546 Pain in thoracic spine: Secondary | ICD-10-CM | POA: Diagnosis not present

## 2017-08-18 DIAGNOSIS — E559 Vitamin D deficiency, unspecified: Secondary | ICD-10-CM | POA: Diagnosis not present

## 2017-08-18 DIAGNOSIS — R799 Abnormal finding of blood chemistry, unspecified: Secondary | ICD-10-CM | POA: Diagnosis not present

## 2017-08-19 LAB — COMPLETE METABOLIC PANEL WITH GFR
AG RATIO: 2.2 (calc) (ref 1.0–2.5)
ALBUMIN MSPROF: 4.1 g/dL (ref 3.6–5.1)
ALT: 9 U/L (ref 6–29)
AST: 10 U/L (ref 10–35)
Alkaline phosphatase (APISO): 48 U/L (ref 33–130)
BUN: 13 mg/dL (ref 7–25)
CALCIUM: 9.7 mg/dL (ref 8.6–10.4)
CO2: 29 mmol/L (ref 20–32)
CREATININE: 0.65 mg/dL (ref 0.50–0.99)
Chloride: 105 mmol/L (ref 98–110)
GFR, EST AFRICAN AMERICAN: 106 mL/min/{1.73_m2} (ref >=60)
GFR, EST NON AFRICAN AMERICAN: 91 mL/min/{1.73_m2} (ref >=60)
GLOBULIN: 1.9 g/dL (ref 1.9–3.7)
Glucose, Bld: 101 mg/dL — ABNORMAL HIGH (ref 65–99)
POTASSIUM: 4.2 mmol/L (ref 3.5–5.3)
SODIUM: 140 mmol/L (ref 135–146)
TOTAL PROTEIN: 6 g/dL — AB (ref 6.1–8.1)
Total Bilirubin: 1 mg/dL (ref 0.2–1.2)

## 2017-08-19 LAB — VITAMIN D 25 HYDROXY (VIT D DEFICIENCY, FRACTURES): Vit D, 25-Hydroxy: 38 ng/mL (ref 30–100)

## 2017-08-23 ENCOUNTER — Encounter: Payer: Self-pay | Admitting: Osteopathic Medicine

## 2017-08-24 ENCOUNTER — Encounter: Payer: Self-pay | Admitting: Osteopathic Medicine

## 2017-08-27 ENCOUNTER — Other Ambulatory Visit: Payer: Self-pay | Admitting: Sports Medicine

## 2017-08-27 DIAGNOSIS — M170 Bilateral primary osteoarthritis of knee: Secondary | ICD-10-CM

## 2017-08-28 ENCOUNTER — Other Ambulatory Visit: Payer: Self-pay | Admitting: Osteopathic Medicine

## 2017-09-05 ENCOUNTER — Other Ambulatory Visit: Payer: Self-pay | Admitting: Osteopathic Medicine

## 2017-09-07 ENCOUNTER — Other Ambulatory Visit: Payer: Self-pay | Admitting: Osteopathic Medicine

## 2017-09-07 DIAGNOSIS — M47817 Spondylosis without myelopathy or radiculopathy, lumbosacral region: Secondary | ICD-10-CM | POA: Diagnosis not present

## 2017-09-08 ENCOUNTER — Other Ambulatory Visit: Payer: Self-pay | Admitting: Osteopathic Medicine

## 2017-09-08 NOTE — Telephone Encounter (Signed)
Requesting RF on Lomotil.  Last written 06-20-17 for #30, 1 tab QID PRN diarrhea  RX Pended, please review and send if appropriate.  Thanks!

## 2017-09-12 ENCOUNTER — Other Ambulatory Visit: Payer: Self-pay | Admitting: Osteopathic Medicine

## 2017-09-13 ENCOUNTER — Telehealth: Payer: Self-pay

## 2017-09-13 NOTE — Telephone Encounter (Signed)
As per pharmacy - pt is requesting med RF for lomotil. Last rx renewed on 09/08/17. Pls advise if RF appropriate. Thanks.

## 2017-09-14 NOTE — Telephone Encounter (Signed)
Spoke with pharmacy, last Rx was sent on 08/12/17 and she picked it up the same day for a quantity of 30 tabs

## 2017-09-14 NOTE — Telephone Encounter (Signed)
Too soon for refill, need to confirm pharmacy received Rx sent 8/30

## 2017-09-14 NOTE — Telephone Encounter (Signed)
Attempted to contact Pt, no answer (straight to VM). Left VM advising of Rx denial and to contact clinic with any questions.

## 2017-09-18 NOTE — Telephone Encounter (Signed)
As per pt, pharmacy made med RF request in error. Pls disregard. Thanks.

## 2017-09-21 DIAGNOSIS — F331 Major depressive disorder, recurrent, moderate: Secondary | ICD-10-CM | POA: Diagnosis not present

## 2017-09-27 ENCOUNTER — Other Ambulatory Visit: Payer: Self-pay | Admitting: Sports Medicine

## 2017-09-27 DIAGNOSIS — M170 Bilateral primary osteoarthritis of knee: Secondary | ICD-10-CM

## 2017-09-30 ENCOUNTER — Other Ambulatory Visit: Payer: Self-pay | Admitting: Family Medicine

## 2017-10-03 ENCOUNTER — Other Ambulatory Visit: Payer: Self-pay

## 2017-10-03 ENCOUNTER — Encounter: Payer: Self-pay | Admitting: Osteopathic Medicine

## 2017-10-03 MED ORDER — PROMETHAZINE HCL 25 MG PO TABS: ORAL_TABLET | ORAL | 0 refills | Status: DC

## 2017-10-09 ENCOUNTER — Other Ambulatory Visit: Payer: Self-pay | Admitting: Osteopathic Medicine

## 2017-10-12 ENCOUNTER — Encounter: Payer: Self-pay | Admitting: Osteopathic Medicine

## 2017-10-12 ENCOUNTER — Other Ambulatory Visit: Payer: Self-pay

## 2017-10-12 DIAGNOSIS — M5442 Lumbago with sciatica, left side: Secondary | ICD-10-CM

## 2017-10-12 DIAGNOSIS — M5416 Radiculopathy, lumbar region: Secondary | ICD-10-CM

## 2017-10-12 DIAGNOSIS — G8929 Other chronic pain: Secondary | ICD-10-CM

## 2017-10-12 MED ORDER — HYDROCODONE-ACETAMINOPHEN 5-325 MG PO TABS: 1.0000 | ORAL_TABLET | Freq: Two times a day (BID) | ORAL | 0 refills | Status: DC | PRN

## 2017-10-12 NOTE — Telephone Encounter (Signed)
Refill request for Hydrocodone.  

## 2017-10-13 ENCOUNTER — Encounter: Payer: Self-pay | Admitting: Osteopathic Medicine

## 2017-10-16 ENCOUNTER — Other Ambulatory Visit: Payer: Self-pay | Admitting: Osteopathic Medicine

## 2017-11-08 ENCOUNTER — Other Ambulatory Visit: Payer: Self-pay | Admitting: Osteopathic Medicine

## 2017-11-08 NOTE — Telephone Encounter (Signed)
Requested medication (s) are due for refill today: Yes  Requested medication (s) are on the active medication list: Yes  Last refill:  10/03/17  Future visit scheduled: Yes  Notes to clinic:  Attempted to call pt. And ask if she is sick or having symptoms. Message left.    Requested Prescriptions  Pending Prescriptions Disp Refills   promethazine (PHENERGAN) 25 MG tablet [Pharmacy Med Name: PROMETHAZINE 25 MG TABLET] 30 tablet 0    Sig: TAKE 1 TABLET EVERY 8 HOURS AS NEEDED FOR NAUSEA AND VOMITING.     Not Delegated - Gastroenterology: Antiemetics Failed - 11/08/2017  8:16 AM      Failed - This refill cannot be delegated      Failed - Valid encounter within last 6 months    Recent Outpatient Visits          3 months ago Chronic bilateral low back pain with left-sided sciatica   [facility] [place] Sunnie Nielsen, DO   6 months ago Encounter for Medicare annual wellness exam   [facility] [place] Sunnie Nielsen, DO   6 months ago Primary osteoarthritis of both knees   [facility] [place] Monica Becton, MD   7 months ago Primary osteoarthritis of both knees   [facility] [place] Monica Becton, MD   7 months ago Obesity (BMI 30-39.9)   [facility]  Sunnie Nielsen, DO

## 2017-11-13 ENCOUNTER — Ambulatory Visit: Payer: Medicare Other | Admitting: Osteopathic Medicine

## 2017-11-13 ENCOUNTER — Encounter: Payer: Self-pay | Admitting: Osteopathic Medicine

## 2017-11-13 VITALS — BP 138/67 | HR 75 | Temp 98.3°F | Wt 209.9 lb

## 2017-11-13 DIAGNOSIS — M5416 Radiculopathy, lumbar region: Secondary | ICD-10-CM

## 2017-11-13 DIAGNOSIS — M170 Bilateral primary osteoarthritis of knee: Secondary | ICD-10-CM | POA: Diagnosis not present

## 2017-11-13 DIAGNOSIS — G8929 Other chronic pain: Secondary | ICD-10-CM

## 2017-11-13 DIAGNOSIS — Z23 Encounter for immunization: Secondary | ICD-10-CM | POA: Diagnosis not present

## 2017-11-13 DIAGNOSIS — M5442 Lumbago with sciatica, left side: Secondary | ICD-10-CM

## 2017-11-13 DIAGNOSIS — M19042 Primary osteoarthritis, left hand: Secondary | ICD-10-CM

## 2017-11-13 MED ORDER — HYDROCODONE-ACETAMINOPHEN 5-325 MG PO TABS: 1.0000 | ORAL_TABLET | Freq: Two times a day (BID) | ORAL | 0 refills | Status: DC | PRN

## 2017-11-13 NOTE — Progress Notes (Signed)
HPI: Donna Sexton is a 67 y.o. female who  has a past medical history of ANEMIA (11/12/2007), Anxiety, Arthritis, ASTHMA NOS W/ACUTE EXACERBATION (01/13/2010), BIPOLAR DISORDER UNSPECIFIED (10/29/2007), COMMON MIGRAINE (12/29/2008), GERD (07/28/2009), HYPERLIPIDEMIA (10/29/2007), HYPERTENSION, BENIGN ESSENTIAL (10/29/2007), OSTEOPENIA (10/30/2007), OVERACTIVE BLADDER (02/21/2008), and SLEEP APNEA (12/31/2009).  she presents to [facility] today, 11/13/17,  for chief complaint of:  Medication refills  Doing well with current medications he brings unused prescription paper to the office today, she did not end up needing an early refill.  PMP reviewed as below, no concerns.   Indication for chronic opioid: The primary encounter diagnosis was Chronic bilateral low back pain with left-sided sciatica. Diagnoses of Lumbar radiculitis, Primary osteoarthritis of both knees, Localized, primary osteoarthritis of hand, left Medication and dose: Hydrocodone-APAP 5-325 mg # pills per month: 60 Last UDS date: 11/13/17  Opioid Treatment Agreement signed (Y/N): 11/13/17  Opioid Treatment Agreement last reviewed with patient:   [facility] reviewed this encounter (include red flags):   None Last filled 10/15/17      Past medical history, surgical history, and family history reviewed.  Current medication list and allergy/intolerance information reviewed.   (See remainder of HPI, ROS, Phys Exam below)     ASSESSMENT/PLAN:   Chronic bilateral low back pain with left-sided sciatica  Lumbar radiculitis  Primary osteoarthritis of both knees  Localized, primary osteoarthritis of hand, left  Need for influenza vaccination - Plan: Flu vaccine HIGH DOSE PF (Fluzone High dose)   Meds ordered this encounter  Medications  . DISCONTD: HYDROcodone-acetaminophen (NORCO/VICODIN) 5-325 MG tablet    Sig: Take 1 tablet by mouth 2 (two) times daily as needed for moderate pain or  severe pain. #60 for 30(thirty) days.    Dispense:  60 tablet    Refill:  0  . DISCONTD: HYDROcodone-acetaminophen (NORCO/VICODIN) 5-325 MG tablet    Sig: Take 1 tablet by mouth 2 (two) times daily as needed for moderate pain or severe pain. #60 for 30(thirty) days.    Dispense:  60 tablet    Refill:  0  . HYDROcodone-acetaminophen (NORCO/VICODIN) 5-325 MG tablet    Sig: Take 1 tablet by mouth 2 (two) times daily as needed for moderate pain or severe pain. #60 for 30(thirty) days.    Dispense:  60 tablet    Refill:  0    Orders Placed This Encounter  Procedures  . Flu vaccine HIGH DOSE PF (Fluzone High dose)  . Pain Mgmt, Profile 5 w/o Conf, U    Follow-up plan: Return in about 3 months (around 02/13/2018) for refill pain medicines, sooner if needed! .                     ############################################ ############################################ ############################################ ############################################    Outpatient Encounter Medications as of 11/13/2017  Medication Sig Note  . amLODipine (NORVASC) 10 MG tablet TAKE 1 TABLET ONCE DAILY.   Marland Kitchen atorvastatin (LIPITOR) 40 MG tablet TAKE 1 TABLET EACH DAY.   . celecoxib (CELEBREX) 200 MG capsule TAKE 1-2 CAPSULES ONCE DAILY AS NEEDED FOR PAIN.   . cyclobenzaprine (FLEXERIL) 5 MG tablet Take 1 tablet (5 mg total) by mouth at bedtime. 02/15/2017: PRN  . diphenoxylate-atropine (LOMOTIL) 2.5-0.025 MG tablet TAKE 1 TABLET FOUR TIMES DAILY AS NEEDED FOR DIARRHEA/LOOSE STOOLS.   Marland Kitchen divalproex (DEPAKOTE ER) 500 MG 24 hr tablet    . gabapentin (NEURONTIN) 400 MG capsule TAKE 1 OR 2 CAPSULES THREE TIMES A DAY.   Marland Kitchen  HYDROcodone-acetaminophen (NORCO/VICODIN) 5-325 MG tablet Take 1 tablet by mouth 2 (two) times daily as needed for moderate pain or severe pain. #60 for 30(thirty) days.   Marland Kitchen labetalol (NORMODYNE) 200 MG tablet TAKE 2 TABLETS IN THE MORNING AND 1 TABLET IN THE EVENING.   Marland Kitchen  labetalol (NORMODYNE) 200 MG tablet TAKE 2 TABLETS IN THE MORNING AND 1 TABLET IN THE EVENING.   Marland Kitchen oxybutynin (DITROPAN) 5 MG tablet TAKE 1/2 TO 1 TABLET TWICE DAILY.   Marland Kitchen promethazine (PHENERGAN) 25 MG tablet TAKE 1 TABLET EVERY 8 HOURS AS NEEDED FOR NAUSEA AND VOMITING.   . promethazine (PHENERGAN) 25 MG tablet TAKE 1 TABLET EVERY 8 HOURS AS NEEDED FOR NAUSEA AND VOMITING.   . promethazine (PHENERGAN) 25 MG tablet TAKE 1 TABLET EVERY 8 HOURS AS NEEDED FOR NAUSEA AND VOMITING.   . promethazine (PHENERGAN) 25 MG tablet TAKE 1 TABLET EVERY 8 HOURS AS NEEDED FOR NAUSEA AND VOMITING.   . triamcinolone cream (KENALOG) 0.1 % Apply 1 application 2 (two) times daily topically. To affected area(s) as needed    No facility-administered encounter medications on file as of 11/13/2017.    Allergies  Allergen Reactions  . Morphine Nausea And Vomiting      Review of Systems:  Constitutional: No recent illness  HEENT: No  headache, no vision change  Cardiac: No  chest pain, No  pressure, No palpitations  Respiratory:  No  shortness of breath. No  Cough  Musculoskeletal: No new myalgia/arthralgia  Neurologic: No  weakness, No  Dizziness  Psychiatric: No  concerns with depression, No  concerns with anxiety  Exam:  BP 138/67 (BP Location: Left Arm, Patient Position: Sitting, Cuff Size: Large)   Pulse 75   Temp 98.3 F (36.8 C) (Oral)   Wt 209 lb 14.4 oz (95.2 kg)   BMI 35.47 kg/m   Constitutional: VS see above. General Appearance: alert, well-developed, well-nourished, NAD  Eyes: Normal lids and conjunctive, non-icteric sclera  Ears, Nose, Mouth, Throat: MMM, Normal external inspection ears/nares/mouth/lips/gums.  Neck: No masses, trachea midline.   Respiratory: Normal respiratory effort. no wheeze, no rhonchi, no rales  Cardiovascular: S1/S2 normal, no murmur, no rub/gallop auscultated. RRR.   Musculoskeletal: Gait normal. Symmetric and independent movement of all  extremities  Neurological: Normal balance/coordination. No tremor.  Skin: warm, dry, intact.   Psychiatric: Normal judgment/insight. Normal mood and affect. Oriented x3.   Visit summary with medication list and pertinent instructions was printed for patient to review, advised to alert [place] if any changes needed. All questions at time of visit were answered - patient instructed to contact office with any additional concerns. ER/RTC precautions were reviewed with the patient and understanding verbalized.   Follow-up plan: Return in about 3 months (around 02/13/2018) for refill pain medicines, sooner if needed! .  Note: Total time spent 25 minutes, greater than 50% of the visit was spent face-to-face counseling and coordinating care for the following: The primary encounter diagnosis was Chronic bilateral low back pain with left-sided sciatica. Diagnoses of Lumbar radiculitis, Primary osteoarthritis of both knees, Localized, primary osteoarthritis of hand, left, and Need for influenza vaccination were also pertinent to this visit.Marland Kitchen  Please note: voice recognition software was used to produce this document, and typos may escape review. Please contact Dr. Lyn Hollingshead for any needed clarifications.

## 2017-11-16 ENCOUNTER — Ambulatory Visit: Payer: Self-pay | Admitting: Osteopathic Medicine

## 2017-11-25 LAB — PAIN MGMT, BENZODIAZEPINES QN, U
ALPHAHYDROXYALPRAZOLAM: NEGATIVE ng/mL (ref <25)
ALPHAHYDROXYMIDAZOLAM: NEGATIVE ng/mL (ref <50)
ALPHAHYDROXYTRIAZOLAM: NEGATIVE ng/mL (ref <50)
Aminoclonazepam: NEGATIVE ng/mL (ref <25)
Hydroxyethylflurazepam: NEGATIVE ng/mL (ref <50)
LORAZEPAM: NEGATIVE ng/mL (ref <50)
Nordiazepam: 60 ng/mL — ABNORMAL HIGH (ref <50)
Oxazepam: 289 ng/mL — ABNORMAL HIGH (ref <50)
TEMAZEPAM: 356 ng/mL — AB (ref <50)

## 2017-11-25 LAB — PAIN MGMT, PROFILE 5 W/O CONF, U
AMPHETAMINES: NEGATIVE ng/mL (ref <500)
Barbiturates: NEGATIVE ng/mL (ref <300)
Benzodiazepines: POSITIVE ng/mL — AB (ref <100)
Cocaine Metabolite: NEGATIVE ng/mL (ref <150)
Creatinine: 55.2 mg/dL
Marijuana Metabolite: NEGATIVE ng/mL (ref <20)
Methadone Metabolite: NEGATIVE ng/mL (ref <100)
OXIDANT: NEGATIVE ug/mL (ref <200)
Opiates: POSITIVE ng/mL — AB (ref <100)
Oxycodone: NEGATIVE ng/mL (ref <100)
PH: 6.87 (ref 4.5–9.0)

## 2017-11-25 LAB — TEST AUTHORIZATION 2

## 2017-11-30 ENCOUNTER — Other Ambulatory Visit: Payer: Self-pay | Admitting: Osteopathic Medicine

## 2017-12-04 ENCOUNTER — Other Ambulatory Visit: Payer: Self-pay | Admitting: Osteopathic Medicine

## 2017-12-15 ENCOUNTER — Other Ambulatory Visit: Payer: Self-pay

## 2017-12-15 MED ORDER — OXYBUTYNIN CHLORIDE 5 MG PO TABS: ORAL_TABLET | ORAL | 0 refills | Status: DC

## 2017-12-20 ENCOUNTER — Encounter: Payer: Self-pay | Admitting: Emergency Medicine

## 2017-12-20 DIAGNOSIS — F411 Generalized anxiety disorder: Secondary | ICD-10-CM

## 2017-12-28 ENCOUNTER — Ambulatory Visit: Payer: Self-pay | Admitting: Psychiatry

## 2017-12-31 ENCOUNTER — Other Ambulatory Visit: Payer: Self-pay | Admitting: Osteopathic Medicine

## 2018-01-30 ENCOUNTER — Encounter: Payer: Self-pay | Admitting: Osteopathic Medicine

## 2018-01-31 MED ORDER — PROMETHAZINE HCL 25 MG PO TABS: 25.0000 mg | ORAL_TABLET | Freq: Three times a day (TID) | ORAL | 0 refills | Status: DC | PRN

## 2018-02-06 ENCOUNTER — Other Ambulatory Visit: Payer: Self-pay | Admitting: Psychiatry

## 2018-02-06 NOTE — Telephone Encounter (Signed)
Need to review paper chart  

## 2018-02-12 ENCOUNTER — Ambulatory Visit (INDEPENDENT_AMBULATORY_CARE_PROVIDER_SITE_OTHER): Payer: Medicare Other | Admitting: Osteopathic Medicine

## 2018-02-12 ENCOUNTER — Encounter: Payer: Self-pay | Admitting: Osteopathic Medicine

## 2018-02-12 VITALS — BP 135/76 | HR 75 | Temp 97.7°F | Wt 209.2 lb

## 2018-02-12 DIAGNOSIS — M5416 Radiculopathy, lumbar region: Secondary | ICD-10-CM | POA: Diagnosis not present

## 2018-02-12 DIAGNOSIS — M5442 Lumbago with sciatica, left side: Secondary | ICD-10-CM

## 2018-02-12 DIAGNOSIS — M19042 Primary osteoarthritis, left hand: Secondary | ICD-10-CM

## 2018-02-12 DIAGNOSIS — G8929 Other chronic pain: Secondary | ICD-10-CM | POA: Diagnosis not present

## 2018-02-12 DIAGNOSIS — M170 Bilateral primary osteoarthritis of knee: Secondary | ICD-10-CM | POA: Diagnosis not present

## 2018-02-12 DIAGNOSIS — M25511 Pain in right shoulder: Secondary | ICD-10-CM

## 2018-02-12 MED ORDER — HYDROCODONE-ACETAMINOPHEN 5-325 MG PO TABS: 1.0000 | ORAL_TABLET | Freq: Two times a day (BID) | ORAL | 0 refills | Status: DC | PRN

## 2018-02-12 MED ORDER — VALSARTAN 160 MG PO TABS: 160.0000 mg | ORAL_TABLET | Freq: Every day | ORAL | 0 refills | Status: DC

## 2018-02-12 NOTE — Patient Instructions (Signed)
Plan:  Will repeat drug screen today - if any concerns, we may not be able to refill your pain medicine. Please reference your controlled substance contract for details on our policy and on federal and [place] law regarding controlled substance prescriptions.    I will check if any medicines you are taking would give a false positive for Valium.   NEVER take prescription medicines from anyone else.   DISCOURAGE taking old or expired medicines - please throw these away. If you need fresh refills of anything, please let me know.

## 2018-02-12 NOTE — Progress Notes (Addendum)
HPI: Donna Sexton is a 68 y.o. female who  has a past medical history of ANEMIA (11/12/2007), Anxiety, Arthritis, ASTHMA NOS W/ACUTE EXACERBATION (01/13/2010), BIPOLAR DISORDER UNSPECIFIED (10/29/2007), COMMON MIGRAINE (12/29/2008), GERD (07/28/2009), HYPERLIPIDEMIA (10/29/2007), HYPERTENSION, BENIGN ESSENTIAL (10/29/2007), OSTEOPENIA (10/30/2007), OVERACTIVE BLADDER (02/21/2008), and SLEEP APNEA (12/31/2009).  she presents to [facility] today, 02/12/18,  for chief complaint of:  Medication refills   Indication for chronic opioid: The primary encounter diagnosis was Chronic bilateral low back pain with left-sided sciatica. Diagnoses of Lumbar radiculitis, Primary osteoarthritis of both knees, Localized, primary osteoarthritis of hand, left  Medication and dose: Hydrocodone-APAP 5-325 mg # pills per month: 60, last filled 01/12/2018 and last dose was taken day before yesterday.   Last UDS date: 11/13/17 - concerning (+)benzodiazepine, confirmed (+) valium metabolites. Patient states she has never taken this medicine, has not accepted any pills from friends, has taken prescribed expired cyclobenzaprine but that's all   Opioid Treatment Agreement signed (Y/N): 11/13/17 Opioid Treatment Agreement last reviewed with patient:  upon signing   [facility] reviewed this encounter (include red flags):   None other than no record of any benzo Rx        Past medical history, surgical history, and family history reviewed.  Current medication list and allergy/intolerance information reviewed.   (See remainder of HPI, ROS, Phys Exam below)     ASSESSMENT/PLAN: The primary encounter diagnosis was Chronic bilateral low back pain with left-sided sciatica. Diagnoses of Lumbar radiculitis, Primary osteoarthritis of both knees, Localized, primary osteoarthritis of hand, left, and Acute pain of right shoulder were also pertinent to this visit.  Orders Placed This Encounter   Procedures  . DG Shoulder Right  . Pain Mgmt, Profile 5 w/o Conf, U   Pt requests lower cost BP meds, has list with her.  See pt instructions  Pt adamantly denies taking unprescribed medications, Rx from friends or family, or street drugs. Confirmatory tests show Valium metabolites pretty clearly. Will repeat UDS today and futher controlled substance refills pending those results.   Meds ordered this encounter  Medications  . valsartan (DIOVAN) 160 MG tablet    Sig: Take 1 tablet (160 mg total) by mouth daily.    Dispense:  90 tablet    Refill:  0    CANCEL amlodipine and labetalol thanks  . DISCONTD: HYDROcodone-acetaminophen (NORCO/VICODIN) 5-325 MG tablet    Sig: Take 1 tablet by mouth 2 (two) times daily as needed for up to 7 days for moderate pain or severe pain.    Dispense:  15 tablet    Refill:  0  . HYDROcodone-acetaminophen (NORCO/VICODIN) 5-325 MG tablet    Sig: Take 1 tablet by mouth 2 (two) times daily as needed for up to 7 days for moderate pain or severe pain.    Dispense:  15 tablet    Refill:  0   Patient Instructions  Plan:  Will repeat drug screen today - if any concerns, we may not be able to refill your pain medicine. Please reference your controlled substance contract for details on our policy and on federal and [place] law regarding controlled substance prescriptions.    I will check if any medicines you are taking would give a false positive for Valium.   NEVER take prescription medicines from anyone else.   DISCOURAGE taking old or expired medicines - please throw these away. If you need fresh refills of anything, please let me know.  Follow-up plan: Return for recheck depending on results .        ADDENDUM 02/15/18  UDS no illicits Opiates negative but pt hadn't taken for 2 days prior to collection of specimen OK to refill x3 mos               ############################################ ############################################ ############################################ ############################################    Outpatient Encounter Medications as of 02/12/2018  Medication Sig Note  . amLODipine (NORVASC) 10 MG tablet TAKE 1 TABLET ONCE DAILY.   Marland Kitchen. atorvastatin (LIPITOR) 40 MG tablet TAKE 1 TABLET EACH DAY.   . busPIRone (BUSPAR) 15 MG tablet Take 15 mg by mouth 2 (two) times daily.   . celecoxib (CELEBREX) 200 MG capsule TAKE 1-2 CAPSULES ONCE DAILY AS NEEDED FOR PAIN.   . cyclobenzaprine (FLEXERIL) 5 MG tablet Take 1 tablet (5 mg total) by mouth at bedtime. 02/15/2017: PRN  . diphenoxylate-atropine (LOMOTIL) 2.5-0.025 MG tablet TAKE 1 TABLET FOUR TIMES DAILY AS NEEDED FOR DIARRHEA/LOOSE STOOLS.   Marland Kitchen. divalproex (DEPAKOTE ER) 500 MG 24 hr tablet TAKE 3 TABLETS AT BEDTIME.   Marland Kitchen. gabapentin (NEURONTIN) 400 MG capsule TAKE 1 OR 2 CAPSULES THREE TIMES A DAY.   Marland Kitchen. labetalol (NORMODYNE) 200 MG tablet TAKE 2 TABLETS IN THE MORNING AND 1 TABLET IN THE EVENING.   Marland Kitchen. labetalol (NORMODYNE) 200 MG tablet TAKE 2 TABLETS IN THE MORNING AND 1 TABLET IN THE EVENING.   Marland Kitchen. labetalol (NORMODYNE) 200 MG tablet TAKE 2 TABLETS IN THE MORNING AND 1 TABLET IN THE EVENING.   Marland Kitchen. lamoTRIgine (LAMICTAL) 25 MG tablet Take 25 mg by mouth daily.   Marland Kitchen. oxybutynin (DITROPAN) 5 MG tablet TAKE 1/2 TO 1 TABLET TWICE DAILY.   Marland Kitchen. promethazine (PHENERGAN) 25 MG tablet Take 1 tablet (25 mg total) by mouth every 8 (eight) hours as needed for nausea or vomiting.   . triamcinolone cream (KENALOG) 0.1 % Apply 1 application 2 (two) times daily topically. To affected area(s) as needed    No facility-administered encounter medications on file as of 02/12/2018.    Allergies  Allergen Reactions  . Morphine Nausea And Vomiting      Review of Systems:  Constitutional: No recent illness  HEENT: No  headache, no vision change  Cardiac: No  chest  pain, No  pressure, No palpitations  Respiratory:  No  shortness of breath. No  Cough  Musculoskeletal: No new myalgia/arthralgia  Neurologic: No  weakness, No  Dizziness  Psychiatric: No  concerns with depression, No  concerns with anxiety  Exam:  BP 135/76 (BP Location: Left Arm, Patient Position: Sitting, Cuff Size: Normal)   Pulse 75   Temp 97.7 F (36.5 C) (Oral)   Wt 209 lb 3.2 oz (94.9 kg)   BMI 35.35 kg/m   Constitutional: VS see above. General Appearance: alert, well-developed, well-nourished, NAD  Eyes: Normal lids and conjunctive, non-icteric sclera  Ears, Nose, Mouth, Throat: MMM, Normal external inspection ears/nares/mouth/lips/gums.  Neck: No masses, trachea midline.   Respiratory: Normal respiratory effort. no wheeze, no rhonchi, no rales  Cardiovascular: S1/S2 normal, no murmur, no rub/gallop auscultated. RRR.   Musculoskeletal: Gait normal. Symmetric and independent movement of all extremities  Neurological: Normal balance/coordination. No tremor.  Skin: warm, dry, intact.   Psychiatric: Normal judgment/insight. Normal mood and affect. Oriented x3.   Visit summary with medication list and pertinent instructions was printed for patient to review, advised to alert us if any changes needed. All questions at time of visit were answered - patient instructed  to contact office with any additional concerns. ER/RTC precautions were reviewed with the patient and understanding verbalized.   Follow-up plan: Return for recheck depending on results .  Note: Total time spent 25 minutes, greater than 50% of the visit was spent face-to-face counseling and coordinating care for the following: The primary encounter diagnosis was Chronic bilateral low back pain with left-sided sciatica. Diagnoses of Lumbar radiculitis, Primary osteoarthritis of both knees, Localized, primary osteoarthritis of hand, left, and Acute pain of right shoulder were also pertinent to this  visit.Marland Kitchen  Please note: voice recognition software was used to produce this document, and typos may escape review. Please contact Dr. Lyn Hollingshead for any needed clarifications.

## 2018-02-13 LAB — PAIN MGMT, PROFILE 5 W/O CONF, U
AMPHETAMINES: NEGATIVE ng/mL (ref <500)
Barbiturates: NEGATIVE ng/mL (ref <300)
Benzodiazepines: NEGATIVE ng/mL (ref <100)
Cocaine Metabolite: NEGATIVE ng/mL (ref <150)
Creatinine: 39.2 mg/dL
Marijuana Metabolite: NEGATIVE ng/mL (ref <20)
Methadone Metabolite: NEGATIVE ng/mL (ref <100)
OPIATES: NEGATIVE ng/mL (ref <100)
Oxidant: NEGATIVE ug/mL (ref <200)
Oxycodone: NEGATIVE ng/mL (ref <100)
pH: 6.9 (ref 4.5–9.0)

## 2018-02-13 NOTE — Telephone Encounter (Signed)
Can go for Xray whenever, will call with results  I recommended sports med follow-up for shoulder

## 2018-02-14 ENCOUNTER — Ambulatory Visit (INDEPENDENT_AMBULATORY_CARE_PROVIDER_SITE_OTHER): Payer: Medicare Other

## 2018-02-14 DIAGNOSIS — M25511 Pain in right shoulder: Secondary | ICD-10-CM | POA: Diagnosis not present

## 2018-02-15 ENCOUNTER — Encounter: Payer: Self-pay | Admitting: Osteopathic Medicine

## 2018-02-15 MED ORDER — HYDROCODONE-ACETAMINOPHEN 5-325 MG PO TABS: 1.0000 | ORAL_TABLET | Freq: Two times a day (BID) | ORAL | 0 refills | Status: DC | PRN

## 2018-02-15 NOTE — Addendum Note (Signed)
Addended by: Deirdre Pippins on: 02/15/2018 07:26 AM   Modules accepted: Orders

## 2018-02-23 ENCOUNTER — Ambulatory Visit (INDEPENDENT_AMBULATORY_CARE_PROVIDER_SITE_OTHER): Payer: Medicare Other | Admitting: Sports Medicine

## 2018-02-23 DIAGNOSIS — M25511 Pain in right shoulder: Secondary | ICD-10-CM | POA: Insufficient documentation

## 2018-02-23 NOTE — Assessment & Plan Note (Addendum)
Acute supraspinatus tear, there does appear to be about 7 to 10 mm of retraction from the greater tuberosity on ultrasound. She did have a secondary subdeltoid/subacromial bursitis. X-rays unremarkable. MRI. Return to go over results, she plans to make her own shoulder surgeon if needed.  She is likely going to need a bit more pain medicine over the next few weeks, and certainly after surgery.  This message is mostly for her PCP, she may need an early refill due to the acute injury.

## 2018-02-23 NOTE — Progress Notes (Signed)
Subjective:    CC: Right shoulder pain  HPI: Is a pleasant 68 year old female, recently was walking her dog, twisted her shoulder and felt a tear, she then developed severe pain, bruising over the anterior shoulder.  She has difficulty with abduction and overhead activities.  Severe, localized without radiation.  Nothing radicular.  No falls.  Pain contract with PCP.  I reviewed the past medical history, family history, social history, surgical history, and allergies today and no changes were needed.  Please see the problem list section below in epic for further details.  Past Medical History: Past Medical History:  Diagnosis Date  . ANEMIA 11/12/2007   Qualifier: Diagnosis of  By: Briscoe Burns CMA, Alvy Beal    . Anxiety   . Arthritis   . ASTHMA NOS W/ACUTE EXACERBATION 01/13/2010   Qualifier: Diagnosis of  By: Yetta Barre MD, Bernadene Bell.   . BIPOLAR DISORDER UNSPECIFIED 10/29/2007   Qualifier: Diagnosis of  By: Yetta Barre MD, Bernadene Bell.   Marland Kitchen COMMON MIGRAINE 12/29/2008   Qualifier: Diagnosis of  By: Yetta Barre MD, Bernadene Bell.   Marland Kitchen GERD 07/28/2009   Qualifier: Diagnosis of  By: Felicity Coyer MD, Raenette Rover HYPERLIPIDEMIA 10/29/2007   Qualifier: Diagnosis of  By: Yetta Barre MD, Bernadene Bell.   . HYPERTENSION, BENIGN ESSENTIAL 10/29/2007   Qualifier: Diagnosis of  By: Yetta Barre MD, Bernadene Bell.   . OSTEOPENIA 10/30/2007   Qualifier: Diagnosis of  By: Yetta Barre MD, Bernadene Bell.   . OVERACTIVE BLADDER 02/21/2008   Qualifier: Diagnosis of  By: Jonny Ruiz MD, Len Blalock   . SLEEP APNEA 12/31/2009   Qualifier: Diagnosis of  By: Yetta Barre MD, Bernadene Bell.    Past Surgical History: Past Surgical History:  Procedure Laterality Date  . ABDOMINAL HYSTERECTOMY    . BREAST SURGERY  1980   implants bilateral  . CARPAL TUNNEL RELEASE    . CHOLECYSTECTOMY    . HERNIA REPAIR    . PAROTID ENDOSCOPY    . TUBAL LIGATION     Social History: Social History   Socioeconomic History  . Marital status: Divorced    Spouse name: Not on file  . Number of children:  Not on file  . Years of education: Not on file  . Highest education level: Not on file  Occupational History  . Not on file  Social Needs  . Financial resource strain: Not on file  . Food insecurity:    Worry: Not on file    Inability: Not on file  . Transportation needs:    Medical: Not on file    Non-medical: Not on file  Tobacco Use  . Smoking status: Never Smoker  . Smokeless tobacco: Never Used  Substance and Sexual Activity  . Alcohol use: No  . Drug use: No  . Sexual activity: Not Currently  Lifestyle  . Physical activity:    Days per week: Not on file    Minutes per session: Not on file  . Stress: Not on file  Relationships  . Social connections:    Talks on phone: Not on file    Gets together: Not on file    Attends religious service: Not on file    Active member of club or organization: Not on file    Attends meetings of clubs or organizations: Not on file    Relationship status: Not on file  Other Topics Concern  . Not on file  Social History Narrative  . Not on file   Family History: Family History  Problem Relation Age of Onset  . Heart attack Mother   . Hypertension Mother   . Diabetes Mother   . Heart attack Father   . Hypertension Father   . Alcohol abuse Maternal Uncle   . Breast cancer Neg Hx    Allergies: Allergies  Allergen Reactions  . Morphine Nausea And Vomiting   Medications: See med rec.  Review of Systems: No fevers, chills, night sweats, weight loss, chest pain, or shortness of breath.   Objective:    General: Well Developed, well nourished, and in no acute distress.  Neuro: Alert and oriented x3, extra-ocular muscles intact, sensation grossly intact.  HEENT: Normocephalic, atraumatic, pupils equal round reactive to light, neck supple, no masses, no lymphadenopathy, thyroid nonpalpable.  Skin: Warm and dry, no rashes. Cardiac: Regular rate and rhythm, no murmurs rubs or gallops, no lower extremity edema.  Respiratory: Clear  to auscultation bilaterally. Not using accessory muscles, speaking in full sentences. Right shoulder: Bruising anteriorly Palpation is normal with no tenderness over AC joint or bicipital groove. Full passive range of motion, she has near full active range of motion, severe weakness to abduction. Positive Neer and Hawkin's tests, empty can. Speeds and Yergason's tests normal. No labral pathology noted with negative Obrien's, negative crank, negative clunk, and good stability. Normal scapular function observed. Mildly positive drop arm sign on the right. No apprehension sign  Procedure: Diagnostic Ultrasound of right shoulder Device: GE Logiq E  Findings: Noted a fairly significant subdeltoid bursitis, also noted approximately 0.7 to 1 cm of retraction of the supraspinatus tendon from the greater tuberosity. Images permanently stored and available for review in the ultrasound unit.  Impression: Full-thickness retracted tear of the supraspinatus  Impression and Recommendations:    Acute pain of right shoulder Acute supraspinatus tear, there does appear to be about 7 to 10 mm of retraction from the greater tuberosity on ultrasound. She did have a secondary subdeltoid/subacromial bursitis. X-rays unremarkable. MRI. Return to go over results, she plans to make her own shoulder surgeon if needed.  She is likely going to need a bit more pain medicine over the next few weeks, and certainly after surgery.  This message is mostly for her PCP, she may need an early refill due to the acute injury. ___________________________________________ Ihor Austin. Benjamin Stain, M.D., ABFM., CAQSM. Primary Care and Sports Medicine [facility]  Adjunct Professor of Family Medicine  [employer]

## 2018-02-25 ENCOUNTER — Encounter: Payer: Self-pay | Admitting: Osteopathic Medicine

## 2018-02-26 ENCOUNTER — Encounter: Payer: Self-pay | Admitting: Sports Medicine

## 2018-02-26 MED ORDER — PROMETHAZINE HCL 25 MG PO TABS: 25.0000 mg | ORAL_TABLET | Freq: Three times a day (TID) | ORAL | 0 refills | Status: DC | PRN

## 2018-03-01 ENCOUNTER — Ambulatory Visit: Payer: Medicare Other | Admitting: Psychiatry

## 2018-03-01 ENCOUNTER — Encounter: Payer: Self-pay | Admitting: Psychiatry

## 2018-03-01 DIAGNOSIS — F1311 Sedative, hypnotic or anxiolytic abuse, in remission: Secondary | ICD-10-CM | POA: Diagnosis not present

## 2018-03-01 DIAGNOSIS — F411 Generalized anxiety disorder: Secondary | ICD-10-CM | POA: Diagnosis not present

## 2018-03-01 DIAGNOSIS — F319 Bipolar disorder, unspecified: Secondary | ICD-10-CM | POA: Diagnosis not present
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
## 2018-03-06 ENCOUNTER — Encounter: Payer: Self-pay | Admitting: Osteopathic Medicine

## 2018-03-07 ENCOUNTER — Other Ambulatory Visit: Payer: Self-pay | Admitting: Osteopathic Medicine

## 2018-03-08 ENCOUNTER — Telehealth: Payer: Self-pay

## 2018-03-08 ENCOUNTER — Other Ambulatory Visit: Payer: Self-pay | Admitting: Psychiatry

## 2018-03-08 DIAGNOSIS — G8929 Other chronic pain: Secondary | ICD-10-CM

## 2018-03-08 DIAGNOSIS — M5416 Radiculopathy, lumbar region: Secondary | ICD-10-CM

## 2018-03-08 DIAGNOSIS — M5442 Lumbago with sciatica, left side: Secondary | ICD-10-CM

## 2018-03-08 MED ORDER — HYDROCODONE-ACETAMINOPHEN 5-325 MG PO TABS: 1.0000 | ORAL_TABLET | Freq: Three times a day (TID) | ORAL | 0 refills | Status: DC | PRN

## 2018-03-08 NOTE — Telephone Encounter (Signed)
Refill sent.

## 2018-03-08 NOTE — Telephone Encounter (Signed)
Left a detailed vm msg for pt regarding med refill for hydrocodone-ace. Call back info provided.

## 2018-03-08 NOTE — Telephone Encounter (Signed)
Pt called stating requesting a refill for hydrocodone-ace. She will be running out of her med by Sunday. She was advised by Dr. Karie Schwalbe to take 3 tabs daily and that he would inform provider of increase in med. As per pt, Dr. Karie Schwalbe states she has a torn rotator cuff with bursitis. Pt has an upcoming appt for MRI on 03/10/18. Pls advise, thanks.

## 2018-03-10 ENCOUNTER — Ambulatory Visit
Admission: RE | Admit: 2018-03-10 | Discharge: 2018-03-10 | Disposition: A | Discharge status: Home / Self Care | Payer: Medicare Other | Source: Ambulatory Visit | Attending: Sports Medicine | Admitting: Sports Medicine

## 2018-03-10 ENCOUNTER — Other Ambulatory Visit: Payer: Self-pay

## 2018-03-10 DIAGNOSIS — M25511 Pain in right shoulder: Secondary | ICD-10-CM

## 2018-03-12 ENCOUNTER — Encounter: Payer: Self-pay | Admitting: Sports Medicine

## 2018-03-12 DIAGNOSIS — M25511 Pain in right shoulder: Secondary | ICD-10-CM

## 2018-03-13 ENCOUNTER — Encounter: Payer: Self-pay | Admitting: Sports Medicine

## 2018-03-13 ENCOUNTER — Encounter: Payer: Self-pay | Admitting: Osteopathic Medicine

## 2018-03-20 ENCOUNTER — Encounter: Payer: Self-pay | Admitting: Osteopathic Medicine

## 2018-03-22 ENCOUNTER — Encounter: Payer: Self-pay | Admitting: Osteopathic Medicine

## 2018-03-22 ENCOUNTER — Other Ambulatory Visit: Payer: Self-pay | Admitting: Osteopathic Medicine

## 2018-03-22 DIAGNOSIS — H579 Unspecified disorder of eye and adnexa: Secondary | ICD-10-CM

## 2018-03-22 MED ORDER — PROMETHAZINE HCL 25 MG PO TABS: 25.0000 mg | ORAL_TABLET | Freq: Three times a day (TID) | ORAL | 1 refills | Status: DC | PRN

## 2018-03-23 ENCOUNTER — Encounter: Payer: Self-pay | Admitting: Osteopathic Medicine

## 2018-03-25 ENCOUNTER — Other Ambulatory Visit: Payer: Self-pay | Admitting: Osteopathic Medicine

## 2018-03-26 ENCOUNTER — Encounter: Payer: Self-pay | Admitting: Osteopathic Medicine

## 2018-03-26 ENCOUNTER — Ambulatory Visit: Payer: Self-pay | Admitting: Osteopathic Medicine

## 2018-03-26 DIAGNOSIS — H579 Unspecified disorder of eye and adnexa: Secondary | ICD-10-CM

## 2018-03-27 ENCOUNTER — Encounter: Payer: Self-pay | Admitting: Osteopathic Medicine

## 2018-03-27 DIAGNOSIS — M75111 Incomplete rotator cuff tear or rupture of right shoulder, not specified as traumatic: Secondary | ICD-10-CM | POA: Diagnosis not present

## 2018-03-28 ENCOUNTER — Encounter: Payer: Self-pay | Admitting: Osteopathic Medicine

## 2018-03-28 DIAGNOSIS — M5442 Lumbago with sciatica, left side: Secondary | ICD-10-CM

## 2018-03-28 DIAGNOSIS — G8929 Other chronic pain: Secondary | ICD-10-CM

## 2018-03-28 DIAGNOSIS — M5416 Radiculopathy, lumbar region: Secondary | ICD-10-CM

## 2018-03-28 MED ORDER — HYDROCODONE-ACETAMINOPHEN 5-325 MG PO TABS: 1.0000 | ORAL_TABLET | Freq: Three times a day (TID) | ORAL | 0 refills | Status: DC | PRN

## 2018-03-29 ENCOUNTER — Other Ambulatory Visit: Payer: Self-pay

## 2018-03-29 MED ORDER — GABAPENTIN 400 MG PO CAPS: ORAL_CAPSULE | ORAL | 1 refills | Status: DC

## 2018-03-30 ENCOUNTER — Encounter: Payer: Self-pay | Admitting: Osteopathic Medicine

## 2018-03-30 NOTE — Telephone Encounter (Signed)
BP Readings from Last 3 Encounters:  02/23/18 (!) 95/56  02/12/18 135/76  11/13/17 138/67   I am not going to put this patient back on old blood pressure medicines, her blood pressure is already on the low side.  If she is worried about this, she needs to come in for a visit so that we can check her blood pressure here and discuss her other symptoms which are concerning her.

## 2018-04-15 ENCOUNTER — Encounter: Payer: Self-pay | Admitting: Osteopathic Medicine

## 2018-04-24 ENCOUNTER — Encounter: Payer: Self-pay | Admitting: Osteopathic Medicine

## 2018-04-24 DIAGNOSIS — G8929 Other chronic pain: Secondary | ICD-10-CM

## 2018-04-24 DIAGNOSIS — M5416 Radiculopathy, lumbar region: Secondary | ICD-10-CM

## 2018-04-24 DIAGNOSIS — M5442 Lumbago with sciatica, left side: Secondary | ICD-10-CM

## 2018-04-25 ENCOUNTER — Encounter: Payer: Self-pay | Admitting: Osteopathic Medicine

## 2018-04-26 ENCOUNTER — Other Ambulatory Visit: Payer: Self-pay | Admitting: Osteopathic Medicine

## 2018-04-26 DIAGNOSIS — G8929 Other chronic pain: Secondary | ICD-10-CM

## 2018-04-26 DIAGNOSIS — M5442 Lumbago with sciatica, left side: Secondary | ICD-10-CM

## 2018-04-26 DIAGNOSIS — M5416 Radiculopathy, lumbar region: Secondary | ICD-10-CM

## 2018-04-26 MED ORDER — HYDROCODONE-ACETAMINOPHEN 5-325 MG PO TABS: 1.0000 | ORAL_TABLET | Freq: Three times a day (TID) | ORAL | 0 refills | Status: DC

## 2018-04-26 NOTE — Telephone Encounter (Signed)
[facility] pharmacy requesting med refill for hydrocodone-ace.

## 2018-05-08 ENCOUNTER — Ambulatory Visit: Payer: Self-pay | Admitting: Osteopathic Medicine

## 2018-05-16 ENCOUNTER — Ambulatory Visit: Payer: Self-pay | Admitting: Osteopathic Medicine

## 2018-05-16 ENCOUNTER — Ambulatory Visit: Payer: Self-pay

## 2018-05-23 ENCOUNTER — Ambulatory Visit: Payer: Medicare Other

## 2018-05-24 ENCOUNTER — Encounter: Payer: Self-pay | Admitting: Osteopathic Medicine

## 2018-05-24 DIAGNOSIS — M5416 Radiculopathy, lumbar region: Secondary | ICD-10-CM

## 2018-05-24 DIAGNOSIS — G8929 Other chronic pain: Secondary | ICD-10-CM

## 2018-05-24 MED ORDER — HYDROCODONE-ACETAMINOPHEN 5-325 MG PO TABS: 1.0000 | ORAL_TABLET | Freq: Three times a day (TID) | ORAL | 0 refills | Status: DC

## 2018-05-29 ENCOUNTER — Ambulatory Visit (INDEPENDENT_AMBULATORY_CARE_PROVIDER_SITE_OTHER): Payer: Medicare Other | Admitting: *Deleted

## 2018-05-29 VITALS — Ht 64.0 in | Wt 209.0 lb

## 2018-05-29 DIAGNOSIS — M170 Bilateral primary osteoarthritis of knee: Secondary | ICD-10-CM

## 2018-05-29 DIAGNOSIS — Z0000 Encounter for general adult medical examination without abnormal findings: Secondary | ICD-10-CM

## 2018-05-29 NOTE — Patient Instructions (Addendum)
Please schedule your next medicare wellness visit with me in 1 yr.  Ms. Donna Sexton , Thank you for taking time to come for your Medicare Wellness Visit. I appreciate your ongoing commitment to your health goals. Please review the following plan we discussed and let me know if I can assist you in the future.    Please schedule your next medicare wellness visit with me in 1 yr.  Ms. Donna Sexton , Thank you for taking time to come for your Medicare Wellness Visit. I appreciate your ongoing commitment to your health goals. Please review the following plan we discussed and let me know if I can assist you in the future.

## 2018-05-29 NOTE — Progress Notes (Signed)
Subjective:   Donna Sexton is a 68 y.o. female who presents for Medicare Annual (Subsequent) preventive examination.  Review of Systems:  No ROS.  Medicare Wellness Virtual Visit.  Visual/audio telehealth visit, UTA vital signs.   See social history for additional risk factors.    Cardiac Risk Factors include: advanced age (>108men, >20 women);hypertension;sedentary lifestyle Sleep patterns: Getting 12 hours of sleep a night. Wakes up 1-2 times a night to go to the bathroom. Wakes up feeling rested.   Home Safety/Smoke Alarms: Feels safe in home. Smoke alarms in place.  Living environment; LIves alone with dog in a 2 story home and steps do have hand rails. Shower is a walk in shower  With grab bars in place as well as a stool in shower. Seat Belt Safety/Bike Helmet: Wears seat belt.   Female:   Pap-  Aged out     Mammo-  UTD     Dexa scan-  Ordered while on phone during visit      CCS- UTD     Objective:     Vitals: There were no vitals taken for this visit.  There is no height or weight on file to calculate BMI.  Advanced Directives 05/29/2018 03/15/2017  Does Patient Have a Medical Advance Directive? Yes Yes  Type of Estate agent of [place];Living will Healthcare Power of [place];Living will  Does patient want to make changes to medical advance directive? No - Patient declined -  Copy of Healthcare Power of Attorney in Chart? No - copy requested No - copy requested    Tobacco Social History   Tobacco Use  Smoking Status Never Smoker  Smokeless Tobacco Never Used     Counseling given: Not Answered   Clinical Intake:  Pre-visit preparation completed: Yes  Pain : 0-10 Pain Score: 6  Pain Type: Acute pain, Chronic pain Pain Location: Shoulder Pain Orientation: Right Pain Radiating Towards: none Pain Descriptors / Indicators: Aching, Constant, Throbbing, Discomfort Pain Onset: More than a month ago Pain Frequency: Constant Pain  Relieving Factors: rehab  Pain Relieving Factors: rehab  Nutritional Risks: None Diabetes: No  How often do you need to have someone help you when you read instructions, pamphlets, or other written materials from your doctor or pharmacy?: 1 - Never What is the last grade level you completed in school?: 9th  Interpreter Needed?: No  Information entered by :: Carolin Sicks, LPN  Past Medical History:  Diagnosis Date  . ANEMIA 11/12/2007   Qualifier: Diagnosis of  By: Briscoe Burns CMA, Alvy Beal    . Anxiety   . Arthritis   . ASTHMA NOS W/ACUTE EXACERBATION 01/13/2010   Qualifier: Diagnosis of  By: Yetta Barre MD, Bernadene Bell.   . BIPOLAR DISORDER UNSPECIFIED 10/29/2007   Qualifier: Diagnosis of  By: Yetta Barre MD, Bernadene Bell.   Marland Kitchen COMMON MIGRAINE 12/29/2008   Qualifier: Diagnosis of  By: Yetta Barre MD, Bernadene Bell.   Marland Kitchen GERD 07/28/2009   Qualifier: Diagnosis of  By: Felicity Coyer MD, Raenette Rover HYPERLIPIDEMIA 10/29/2007   Qualifier: Diagnosis of  By: Yetta Barre MD, Bernadene Bell.   . HYPERTENSION, BENIGN ESSENTIAL 10/29/2007   Qualifier: Diagnosis of  By: Yetta Barre MD, Bernadene Bell.   . OSTEOPENIA 10/30/2007   Qualifier: Diagnosis of  By: Yetta Barre MD, Bernadene Bell.   . OVERACTIVE BLADDER 02/21/2008   Qualifier: Diagnosis of  By: Jonny Ruiz MD, Len Blalock   . SLEEP APNEA 12/31/2009   Qualifier: Diagnosis of  By: Yetta Barre MD, Maisie Fus  L.    Past Surgical History:  Procedure Laterality Date  . ABDOMINAL HYSTERECTOMY    . BREAST SURGERY  1980   implants bilateral  . CARPAL TUNNEL RELEASE    . CHOLECYSTECTOMY    . HERNIA REPAIR    . PAROTID ENDOSCOPY    . TUBAL LIGATION     Family History  Problem Relation Age of Onset  . Heart attack Mother   . Hypertension Mother   . Diabetes Mother   . Heart attack Father   . Hypertension Father   . Alcohol abuse Maternal Uncle   . Breast cancer Neg Hx    Social History   Socioeconomic History  . Marital status: Divorced    Spouse name: Not on file  . Number of children: 2  . Years of education: 9  .  Highest education level: Some college, no degree  Occupational History  . 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 Financial resource strain: Not hard at all  . Food insecurity:    Worry: Never true    Inability: Never true  . Transportation needs:    Medical: No    Non-medical: No  Tobacco Use  . Smoking status: Never Smoker  . Smokeless tobacco: Never Used  Substance and Sexual Activity  . Alcohol use: No  . Drug use: No  . Sexual activity: Not Currently  Lifestyle  . Physical activity:    Days per week: 0 days    Minutes per session: 0 min  . Stress: Not at all  Relationships  . Social connections:    Talks on phone: Three times a week    Gets together: Never    Attends religious service: More than 4 times per year    Active member of club or organization: No    Attends meetings of clubs or organizations: Never    Relationship status: Divorced  Other Topics Concern  . Not on file  Social History Narrative   Take care of her dog.    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 atorvastatin (LIPITOR) 40 MG tablet TAKE 1 TABLET EACH DAY.  . cyclobenzaprine (FLEXERIL) 5 MG tablet Take 1 tablet (5 mg total) by mouth at bedtime.  . diphenoxylate-atropine (LOMOTIL) 2.5-0.025 MG tablet TAKE 1 TABLET FOUR TIMES DAILY AS NEEDED FOR DIARRHEA/LOOSE STOOLS.  Marland Kitchen divalproex (DEPAKOTE ER) 500 MG 24 hr tablet TAKE 3 TABLETS AT BEDTIME.  Marland Kitchen gabapentin (NEURONTIN) 400 MG capsule TAKE 1 OR 2 CAPSULES THREE TIMES A DAY.  Marland Kitchen HYDROcodone-acetaminophen (NORCO/VICODIN) 5-325 MG tablet Take 1 tablet by mouth every 8 (eight) hours for 30 days.  Marland Kitchen oxybutynin (DITROPAN) 5 MG tablet TAKE 1/2 TO 1 TABLET TWICE DAILY.  Marland Kitchen promethazine (PHENERGAN) 25 MG tablet Take 1 tablet (25 mg total) by mouth every 8 (eight) hours as needed for nausea or vomiting.  . triamcinolone cream (KENALOG) 0.1 % Apply 1 application 2 (two)  times daily topically. To affected area(s) as needed  . valsartan (DIOVAN) 160 MG tablet Take 1 tablet (160 mg total) by mouth daily.  . celecoxib (CELEBREX) 200 MG capsule TAKE 1-2 CAPSULES ONCE DAILY AS NEEDED FOR PAIN. (Patient not taking: Reported on 02/12/2018)   No facility-administered encounter medications on file as of 05/29/2018.     Activities of Daily Living In your present state of health, do you have any difficulty performing the following  activities: 05/29/2018  Hearing? N  Vision? N  Difficulty concentrating or making decisions? N  Walking or climbing stairs? N  Dressing or bathing? N  Doing errands, shopping? N  Preparing Food and eating ? N  Using the Toilet? N  In the past six months, have you accidently leaked urine? N  Do you have problems with loss of bowel control? N  Managing your Medications? N  Managing your Finances? N  Housekeeping or managing your Housekeeping? N  Some recent data might be hidden    Patient Care Team: Sunnie Nielsen, DO as PCP - General (Osteopathic Medicine)    Assessment:   This is a routine wellness examination for Joanny.Physical assessment deferred to PCP.   Exercise Activities and Dietary recommendations Current Exercise Habits: The patient does not participate in regular exercise at present, Exercise limited by: None identified Diet Eating healthy with vegetables, meats, dairy and takes calcium supplement. Breakfast: cereal or waffles or eggs and bacon Lunch: peanut butter and banana sandwich Dinner: Fish, steak and vegetables       Goals    . Exercise 3x per week (30 min per time)     Wants to get back into exercising, walking.  Advised to start out walking at least 3 times a week for at least 30 minutes at a time.       Fall Risk Fall Risk  05/29/2018 05/12/2017 09/24/2015  Falls in the past year? 0 No No  Follow up Falls prevention discussed - -   Is the patient's home free of loose throw rugs in walkways, pet  beds, electrical cords, etc?   yes      Grab bars in the bathroom? yes      Handrails on the stairs?   yes      Adequate lighting?   yes  Depression Screen PHQ 2/9 Scores 05/29/2018 02/12/2018 05/12/2017 11/18/2016  PHQ - 2 Score 0 1 0 0  PHQ- 9 Score - 2 - -     Cognitive Function     6CIT Screen 05/29/2018  What Year? 0 points  What month? 0 points  What time? 0 points  Count back from 20 0 points  Months in reverse 0 points  Repeat phrase 2 points  Total Score 2    Immunization History  Administered Date(s) Administered  . Influenza Nasal 11/05/2010  . Influenza Split 09/26/2011  . Influenza Whole 10/07/2008, 09/10/2009, 11/10/2012  . Influenza, High Dose Seasonal PF 11/13/2017  . Influenza,Quad,Nasal, Live 11/05/2010  . Influenza,inj,Quad PF,6+ Mos 11/18/2013, 10/14/2014, 09/21/2015  . Influenza-Unspecified 10/10/2016  . Pneumococcal Conjugate-13 07/28/2014  . Pneumococcal Polysaccharide-23 10/31/2012  . Td 01/11/2004  . Tdap 01/11/2004, 04/15/2013  . Zoster 05/22/2012  . Zoster Recombinat (Shingrix) 07/25/2017, 10/17/2017    Screening Tests Health Maintenance  Topic Date Due  . PNA vac Low Risk Adult (2 of 2 - PPSV23) 10/31/2017  . INFLUENZA VACCINE  08/11/2018  . MAMMOGRAM  05/20/2019  . COLONOSCOPY  06/30/2020  . TETANUS/TDAP  04/16/2023  . DEXA SCAN  Completed  . Hepatitis C Screening  Completed       Plan:    Please schedule your next medicare wellness visit with me in 1 yr.  Ms. Solem , Thank you for taking time to come for your Medicare Wellness Visit. I appreciate your ongoing commitment to your health goals. Please review the following plan we discussed and let me know if I can assist you in the future.   These are  the goals we discussed: Goals    . Exercise 3x per week (30 min per time)     Wants to get back into exercising, walking.  Advised to start out walking at least 3 times a week for at least 30 minutes at a time.       This is a list  of the screening recommended for you and due dates:  Health Maintenance  Topic Date Due  . Pneumonia vaccines (2 of 2 - PPSV23) 10/31/2017  . Flu Shot  08/11/2018  . Mammogram  05/20/2019  . Colon Cancer Screening  06/30/2020  . Tetanus Vaccine  04/16/2023  . DEXA scan (bone density measurement)  Completed  .  Hepatitis C: One time screening is recommended by Center for Disease Control  (CDC) for  adults born from [birth year] through 1966.   Completed      I have personally reviewed and noted the following in the patient's chart:   . Medical and social history . Use of alcohol, tobacco or illicit drugs  . Current medications and supplements . Functional ability and status . Nutritional status . Physical activity . Advanced directives . List of other physicians . Hospitalizations, surgeries, and ER visits in previous 12 months . Vitals . Screenings to include cognitive, depression, and falls . Referrals and appointments  In addition, I have reviewed and discussed with patient certain preventive protocols, quality metrics, and best practice recommendations. A written personalized care plan for preventive services as well as general preventive health recommendations were provided to patient.     Normand SloopKimberly A , LPN  1/61/09605/19/2020

## 2018-05-30 ENCOUNTER — Other Ambulatory Visit: Payer: Self-pay | Admitting: Osteopathic Medicine

## 2018-05-31 ENCOUNTER — Other Ambulatory Visit: Payer: Self-pay | Admitting: Osteopathic Medicine

## 2018-06-04 ENCOUNTER — Other Ambulatory Visit: Payer: Self-pay | Admitting: Osteopathic Medicine

## 2018-06-05 ENCOUNTER — Encounter: Payer: Self-pay | Admitting: Osteopathic Medicine

## 2018-06-06 MED ORDER — DIPHENOXYLATE-ATROPINE 2.5-0.025 MG PO TABS: 1.0000 | ORAL_TABLET | Freq: Four times a day (QID) | ORAL | 0 refills | Status: DC | PRN

## 2018-06-07 ENCOUNTER — Other Ambulatory Visit: Payer: Self-pay | Admitting: Osteopathic Medicine

## 2018-06-15 ENCOUNTER — Other Ambulatory Visit: Payer: Self-pay | Admitting: Osteopathic Medicine

## 2018-06-21 ENCOUNTER — Encounter: Payer: Self-pay | Admitting: Osteopathic Medicine

## 2018-06-21 DIAGNOSIS — M5416 Radiculopathy, lumbar region: Secondary | ICD-10-CM

## 2018-06-21 DIAGNOSIS — G8929 Other chronic pain: Secondary | ICD-10-CM

## 2018-06-22 ENCOUNTER — Encounter: Payer: Self-pay | Admitting: Osteopathic Medicine

## 2018-06-22 MED ORDER — HYDROCODONE-ACETAMINOPHEN 5-325 MG PO TABS: 1.0000 | ORAL_TABLET | Freq: Three times a day (TID) | ORAL | 0 refills | Status: DC

## 2018-06-22 NOTE — Telephone Encounter (Signed)
PDMP reviewed during this encounter. Last filled 05/24/2018 Due for refill 06/24/2018, Rx sent w/ appropriate fill date

## 2018-06-28 ENCOUNTER — Other Ambulatory Visit: Payer: Self-pay | Admitting: Osteopathic Medicine

## 2018-06-28 NOTE — Telephone Encounter (Signed)
Please advise 

## 2018-06-29 ENCOUNTER — Encounter: Payer: Self-pay | Admitting: Osteopathic Medicine

## 2018-06-29 MED ORDER — PROMETHAZINE HCL 25 MG PO TABS: ORAL_TABLET | ORAL | 3 refills | Status: DC

## 2018-07-15 ENCOUNTER — Other Ambulatory Visit: Payer: Self-pay | Admitting: Osteopathic Medicine

## 2018-07-19 ENCOUNTER — Encounter: Payer: Self-pay | Admitting: Osteopathic Medicine

## 2018-07-19 DIAGNOSIS — G8929 Other chronic pain: Secondary | ICD-10-CM

## 2018-07-19 DIAGNOSIS — M5416 Radiculopathy, lumbar region: Secondary | ICD-10-CM

## 2018-07-19 MED ORDER — HYDROCODONE-ACETAMINOPHEN 5-325 MG PO TABS: 1.0000 | ORAL_TABLET | Freq: Three times a day (TID) | ORAL | 0 refills | Status: DC

## 2018-07-23 ENCOUNTER — Encounter: Payer: Self-pay | Admitting: Osteopathic Medicine

## 2018-07-27 ENCOUNTER — Encounter: Payer: Self-pay | Admitting: Osteopathic Medicine

## 2018-07-27 MED ORDER — LABETALOL HCL 200 MG PO TABS: ORAL_TABLET | ORAL | 0 refills | Status: DC

## 2018-07-27 NOTE — Telephone Encounter (Signed)
Needs virtual / phone visit

## 2018-07-29 ENCOUNTER — Encounter: Payer: Self-pay | Admitting: Osteopathic Medicine

## 2018-08-01 ENCOUNTER — Telehealth: Payer: Medicare Other | Admitting: Osteopathic Medicine

## 2018-08-01 NOTE — Progress Notes (Signed)
Attempted to contact pt at 325 pm & 335 pm, no answer. Left a vm msg.

## 2018-08-02 ENCOUNTER — Encounter: Payer: Self-pay | Admitting: Osteopathic Medicine

## 2018-08-02 ENCOUNTER — Telehealth (INDEPENDENT_AMBULATORY_CARE_PROVIDER_SITE_OTHER): Payer: Medicare Other | Admitting: Osteopathic Medicine

## 2018-08-02 VITALS — BP 141/87 | HR 78 | Temp 97.1°F | Wt 210.0 lb

## 2018-08-02 DIAGNOSIS — M5416 Radiculopathy, lumbar region: Secondary | ICD-10-CM

## 2018-08-02 DIAGNOSIS — M5442 Lumbago with sciatica, left side: Secondary | ICD-10-CM

## 2018-08-02 DIAGNOSIS — G8929 Other chronic pain: Secondary | ICD-10-CM

## 2018-08-02 DIAGNOSIS — I10 Essential (primary) hypertension: Secondary | ICD-10-CM | POA: Diagnosis not present

## 2018-08-02 MED ORDER — ATORVASTATIN CALCIUM 40 MG PO TABS: ORAL_TABLET | ORAL | 3 refills | Status: DC

## 2018-08-02 MED ORDER — AMLODIPINE BESYLATE 10 MG PO TABS: 10.0000 mg | ORAL_TABLET | Freq: Every day | ORAL | 3 refills | Status: DC

## 2018-08-02 MED ORDER — LABETALOL HCL 200 MG PO TABS: ORAL_TABLET | ORAL | 3 refills | Status: DC

## 2018-08-02 NOTE — Progress Notes (Signed)
Virtual Visit via Video (App used: MyChart) Note  I connected with      Donna Sexton on 08/02/18 at 10:30 AM by a telemedicine application and verified that I am speaking with the correct person using two identifiers.  Patient is at home I am in office    I discussed the limitations of evaluation and management by telemedicine and the availability of in person appointments. The patient expressed understanding and agreed to proceed.  History of Present Illness: Donna Sexton is a 68 y.o. female who would like to discuss BP medications   Valsartan caused headaches, so she had stopped this medication and requested to be back on amlodipine which she was on a while ago.  She has been taking the labetalol as well.  No chest pain, pressure, shortness of breath.  BP Readings from Last 3 Encounters:  08/02/18 (!) 141/87  02/23/18 (!) 95/56  02/12/18 135/76   Indication for chronic opioid: knee pain, shoulder pain, low back pain Medication and dose: Hydrocodone-APAP 5-325 tid prn # pills per month: 90 Last UDS date: 11/2017 Opioid Treatment Agreement signed (Y/N): yes Opioid Treatment Agreement last reviewed with patient:   NCCSRS reviewed this encounter (include red flags):        Observations/Objective: BP (!) 141/87 (Patient Position: Sitting, Cuff Size: Normal)   Pulse 78   Temp (!) 97.1 F (36.2 C) (Oral)   Wt 210 lb (95.3 kg)   BMI 36.05 kg/m  BP Readings from Last 3 Encounters:  08/02/18 (!) 141/87  02/23/18 (!) 95/56  02/12/18 135/76   Exam: Normal Speech.  NAD  Lab and Radiology Results No results found for this or any previous visit (from the past 72 hour(s)). No results found.     Assessment and Plan: 68 y.o. female with The encounter diagnosis was Essential hypertension.  Also doing well on current medications otherwise, including pain medicines.  I am okay to count this as our check-in for pain as well, plan to touch base again in 3 months to  maintain prescription pain medications, let me know sooner if anything else comes up PDMP not reviewed this encounter. No orders of the defined types were placed in this encounter.  No orders of the defined types were placed in this encounter.  There are no Patient Instructions on file for this visit.  Instructions sent via MyChart. If MyChart not available, pt was given option for info via personal e-mail w/ no guarantee of protected health info over unsecured e-mail communication, and MyChart sign-up instructions were included.   Follow Up Instructions: Return in about 3 months (around 11/02/2018). Will ideally get OV and UDS btu depends on COVID     I discussed the assessment and treatment plan with the patient. The patient was provided an opportunity to ask questions and all were answered. The patient agreed with the plan and demonstrated an understanding of the instructions.   The patient was advised to call back or seek an in-person evaluation if any new concerns, if symptoms worsen or if the condition fails to improve as anticipated.  25 minutes of non-face-to-face time was provided during this encounter.                      Historical information moved to improve visibility of documentation.  Past Medical History:  Diagnosis Date  . ANEMIA 11/12/2007   Qualifier: Diagnosis of  By: Tiney Rouge CMA, Ellison Hughs    . Anxiety   .  Arthritis   . ASTHMA NOS W/ACUTE EXACERBATION 01/13/2010   Qualifier: Diagnosis of  By: Yetta BarreJones MD, Bernadene Bellhomas L.   . BIPOLAR DISORDER UNSPECIFIED 10/29/2007   Qualifier: Diagnosis of  By: Yetta BarreJones MD, Bernadene Bellhomas L.   Marland Kitchen. COMMON MIGRAINE 12/29/2008   Qualifier: Diagnosis of  By: Yetta BarreJones MD, Bernadene Bellhomas L.   Marland Kitchen. GERD 07/28/2009   Qualifier: Diagnosis of  By: Felicity CoyerLeschber MD, Raenette RoverValerie A   . HYPERLIPIDEMIA 10/29/2007   Qualifier: Diagnosis of  By: Yetta BarreJones MD, Bernadene Bellhomas L.   . HYPERTENSION, BENIGN ESSENTIAL 10/29/2007   Qualifier: Diagnosis of  By: Yetta BarreJones MD, Bernadene Bellhomas L.   .  OSTEOPENIA 10/30/2007   Qualifier: Diagnosis of  By: Yetta BarreJones MD, Bernadene Bellhomas L.   . OVERACTIVE BLADDER 02/21/2008   Qualifier: Diagnosis of  By: Jonny RuizJohn MD, Len BlalockJames W   . SLEEP APNEA 12/31/2009   Qualifier: Diagnosis of  By: Yetta BarreJones MD, Bernadene Bellhomas L.    Past Surgical History:  Procedure Laterality Date  . ABDOMINAL HYSTERECTOMY    . BREAST SURGERY  1980   implants bilateral  . CARPAL TUNNEL RELEASE    . CHOLECYSTECTOMY    . HERNIA REPAIR    . PAROTID ENDOSCOPY    . TUBAL LIGATION     Social History   Tobacco Use  . Smoking status: Never Smoker  . Smokeless tobacco: Never Used  Substance Use Topics  . Alcohol use: No   family history includes Alcohol abuse in her maternal uncle; Diabetes in her mother; Heart attack in her father and mother; Hypertension in her father and mother.  Medications: Current Outpatient Medications  Medication Sig Dispense Refill  . atorvastatin (LIPITOR) 40 MG tablet TAKE 1 TABLET EACH DAY. 90 tablet 0  . cyclobenzaprine (FLEXERIL) 5 MG tablet Take 1 tablet (5 mg total) by mouth at bedtime. 90 tablet 3  . diphenoxylate-atropine (LOMOTIL) 2.5-0.025 MG tablet Take 1 tablet by mouth 4 (four) times daily as needed for diarrhea or loose stools. 30 tablet 0  . divalproex (DEPAKOTE ER) 500 MG 24 hr tablet TAKE 3 TABLETS AT BEDTIME. 90 tablet 6  . gabapentin (NEURONTIN) 400 MG capsule TAKE 1 OR 2 CAPSULES THREE TIMES A DAY. 180 capsule 1  . HYDROcodone-acetaminophen (NORCO/VICODIN) 5-325 MG tablet Take 1 tablet by mouth every 8 (eight) hours. 90 tablet 0  . labetalol (NORMODYNE) 200 MG tablet TAKE 2 TABLETS IN THE MORNING AND 1 TABLET IN THE EVENING. 90 tablet 0  . oxybutynin (DITROPAN) 5 MG tablet TAKE 1/2 TO 1 TABLET TWICE DAILY. 180 tablet 0  . promethazine (PHENERGAN) 25 MG tablet TAKE 1 TABLET EVERY 8 HOURS AS NEEDED FOR NAUSEA AND VOMITING. 90 tablet 3  . triamcinolone cream (KENALOG) 0.1 % Apply 1 application 2 (two) times daily topically. To affected area(s) as needed 15  g 1  . valsartan (DIOVAN) 160 MG tablet TAKE 1 TABLET ONCE DAILY. 90 tablet 0  . celecoxib (CELEBREX) 200 MG capsule TAKE 1-2 CAPSULES ONCE DAILY AS NEEDED FOR PAIN. (Patient not taking: Reported on 02/12/2018) 60 capsule 0   No current facility-administered medications for this visit.    Allergies  Allergen Reactions  . Morphine Nausea And Vomiting    PDMP not reviewed this encounter. No orders of the defined types were placed in this encounter.  No orders of the defined types were placed in this encounter.

## 2018-08-13 DIAGNOSIS — M75111 Incomplete rotator cuff tear or rupture of right shoulder, not specified as traumatic: Secondary | ICD-10-CM | POA: Diagnosis not present

## 2018-08-19 ENCOUNTER — Encounter: Payer: Self-pay | Admitting: Osteopathic Medicine

## 2018-08-20 ENCOUNTER — Encounter: Payer: Self-pay | Admitting: Osteopathic Medicine

## 2018-08-20 DIAGNOSIS — G8929 Other chronic pain: Secondary | ICD-10-CM

## 2018-08-20 DIAGNOSIS — M5416 Radiculopathy, lumbar region: Secondary | ICD-10-CM

## 2018-08-21 ENCOUNTER — Encounter: Payer: Self-pay | Admitting: Osteopathic Medicine

## 2018-08-21 MED ORDER — HYDROCODONE-ACETAMINOPHEN 5-325 MG PO TABS: 1.0000 | ORAL_TABLET | Freq: Three times a day (TID) | ORAL | 0 refills | Status: DC

## 2018-08-30 ENCOUNTER — Ambulatory Visit: Payer: Medicare Other | Admitting: Psychiatry

## 2018-09-02 ENCOUNTER — Other Ambulatory Visit: Payer: Self-pay | Admitting: Osteopathic Medicine

## 2018-09-05 ENCOUNTER — Other Ambulatory Visit: Payer: Self-pay | Admitting: Osteopathic Medicine

## 2018-09-05 NOTE — Telephone Encounter (Signed)
Requested medication (s) are due for refill today: yes  Requested medication (s) are on the active medication list:yes  Last refill: 06/06/2018  Future visit scheduled: mo  Notes to clinic:  This refill cannot be delegated   Requested Prescriptions  Pending Prescriptions Disp Refills   diphenoxylate-atropine (LOMOTIL) 2.5-0.025 MG tablet [Pharmacy Med Name: DIPHENOXYLATE-ATROP 2.5-0.025] 30 tablet 0    Sig: TAKE 1 TABLET FOUR TIMES DAILY AS NEEDED FOR DIARRHEA/LOOSE STOOLS.     Not Delegated - Gastroenterology:  Antidiarrheals Failed - 09/05/2018  8:19 AM      Failed - This refill cannot be delegated      Passed - Valid encounter within last 12 months    Recent Outpatient Visits          6 months ago Acute pain of right shoulder   [place], Gwen Her, MD   6 months ago Chronic bilateral low back pain with left-sided sciatica   [facility], Lanelle Bal, DO   9 months ago Chronic bilateral low back pain with left-sided sciatica   [place] [facility], Lanelle Bal, DO   1 year ago Chronic bilateral low back pain with left-sided sciatica   [place] [facility] [place], Lanelle Bal, DO   1 year ago Encounter for Medicare annual wellness exam   [facility], [place], DO

## 2018-09-13 ENCOUNTER — Encounter: Payer: Self-pay | Admitting: Osteopathic Medicine

## 2018-09-13 DIAGNOSIS — M5416 Radiculopathy, lumbar region: Secondary | ICD-10-CM

## 2018-09-13 DIAGNOSIS — M47816 Spondylosis without myelopathy or radiculopathy, lumbar region: Secondary | ICD-10-CM | POA: Diagnosis not present

## 2018-09-13 DIAGNOSIS — G8929 Other chronic pain: Secondary | ICD-10-CM

## 2018-09-13 DIAGNOSIS — M47817 Spondylosis without myelopathy or radiculopathy, lumbosacral region: Secondary | ICD-10-CM | POA: Diagnosis not present

## 2018-09-13 MED ORDER — HYDROCODONE-ACETAMINOPHEN 5-325 MG PO TABS: 1.0000 | ORAL_TABLET | Freq: Three times a day (TID) | ORAL | 0 refills | Status: DC

## 2018-09-13 NOTE — Telephone Encounter (Signed)
Filled 08/21/18 Due for refill then 09/20/18, last day of meds 09/19/18 Day #30

## 2018-10-09 ENCOUNTER — Ambulatory Visit: Payer: Medicare Other | Admitting: Psychiatry

## 2018-10-10 ENCOUNTER — Other Ambulatory Visit: Payer: Self-pay | Admitting: Osteopathic Medicine

## 2018-10-10 NOTE — Telephone Encounter (Signed)
Requested medication (s) are due for refill today: yes  Requested medication (s) are on the active medication list: yes  Last refill:  09/02/2018  Future visit scheduled: no  Notes to clinic:  Refill cannot be delegated    Requested Prescriptions  Pending Prescriptions Disp Refills   promethazine (PHENERGAN) 25 MG tablet [Pharmacy Med Name: PROMETHAZINE 25 MG TABLET] 90 tablet 0    Sig: TAKE 1 TABLET EVERY 8 HOURS AS NEEDED FOR NAUSEA AND VOMITING.     Not Delegated - Gastroenterology: Antiemetics Failed - 10/10/2018  8:19 AM      Failed - This refill cannot be delegated      Passed - Valid encounter within last 6 months    Recent Outpatient Visits          2 months ago Lumbar radiculitis   [place], Lanelle Bal, DO   2 months ago Medication management   [facility], Lanelle Bal, DO   7 months ago Acute pain of right shoulder   [place] [facility], Gwen Her, MD   8 months ago Chronic bilateral low back pain with left-sided sciatica   [facility] [place], DO   11 months ago Chronic bilateral low back pain with left-sided sciatica   [facility], [place], DO

## 2018-10-14 ENCOUNTER — Encounter: Payer: Self-pay | Admitting: Osteopathic Medicine

## 2018-10-14 DIAGNOSIS — M5416 Radiculopathy, lumbar region: Secondary | ICD-10-CM

## 2018-10-14 DIAGNOSIS — G8929 Other chronic pain: Secondary | ICD-10-CM

## 2018-10-15 MED ORDER — HYDROCODONE-ACETAMINOPHEN 5-325 MG PO TABS: 1.0000 | ORAL_TABLET | Freq: Three times a day (TID) | ORAL | 0 refills | Status: DC

## 2018-10-16 ENCOUNTER — Other Ambulatory Visit: Payer: Self-pay | Admitting: Osteopathic Medicine

## 2018-10-26 ENCOUNTER — Other Ambulatory Visit: Payer: Self-pay | Admitting: Osteopathic Medicine

## 2018-11-05 ENCOUNTER — Ambulatory Visit (INDEPENDENT_AMBULATORY_CARE_PROVIDER_SITE_OTHER): Payer: Medicare Other | Admitting: Psychiatry

## 2018-11-05 ENCOUNTER — Encounter: Payer: Self-pay | Admitting: Psychiatry

## 2018-11-05 ENCOUNTER — Other Ambulatory Visit: Payer: Self-pay

## 2018-11-05 DIAGNOSIS — F319 Bipolar disorder, unspecified: Secondary | ICD-10-CM | POA: Diagnosis not present

## 2018-11-05 DIAGNOSIS — F1311 Sedative, hypnotic or anxiolytic abuse, in remission: Secondary | ICD-10-CM | POA: Diagnosis not present

## 2018-11-05 DIAGNOSIS — F4322 Adjustment disorder with anxiety: Secondary | ICD-10-CM | POA: Diagnosis not present

## 2018-11-05 DIAGNOSIS — F411 Generalized anxiety disorder: Secondary | ICD-10-CM | POA: Diagnosis not present

## 2018-11-05 NOTE — Progress Notes (Signed)
Donna Sexton [id number] Jul 05, 1950 68 y.o.  Virtual Visit via Jackquline [place]  I connected with pt by WebEx and verified that I am speaking with the correct person using two identifiers.   I discussed the limitations, risks, security and privacy concerns of performing an evaluation and management service by Jackquline [place] and the availability of in person appointments. I also discussed with the patient that there may be a patient responsible charge related to this service. The patient expressed understanding and agreed to proceed.  I discussed the assessment and treatment plan with the patient. The patient was provided an opportunity to ask questions and all were answered. The patient agreed with the plan and demonstrated an understanding of the instructions.   The patient was advised to call back or seek an in-person evaluation if the symptoms worsen or if the condition fails to improve as anticipated.  I provided 30 minutes of video time during this encounter. The call started at 245 and ended at 3:15. The patient was located at home and the provider was located office.  Subjective:   Patient ID:  Donna Sexton is a 68 y.o. (DOB 1950-08-15) female.  Chief Complaint:  Chief Complaint  Patient presents with  . Follow-up    Medication Management  . Other    Bipolar 1  . Stress    scammed    HPI Donna Sexton presents to the office today for follow-up of bipolar disorder and anxiety and a history of occasional compliance problems.  Last seen February 2020.  No meds were changed.  She was maintained on Depakote 1500 HS.  Marland KitchenReally busy.  Almost lost her brother.  He was confused and septic.  Better now.  She's relieved.  Scammed from someone again pretending to be [employer].  B helped her out of it.  Change in accounts. Feels better about it now.  Was really worried for a couple of days.  Has become a little paranoid.  Had to stop buspirone DT GI SE.  Not depressed.  Been stressed a lot lately from a  variety of things.  I'll be OK.  Doing the best I can.  Function is ok.  Sleep pretty good unless stressed.  Patient reports stable mood and denies depressed or irritable moods except as indicated.   Patient denies difficulty with sleep initiation or maintenance. Denies appetite disturbance.  Patient reports that energy and motivation have been good.  Patient denies any difficulty with concentration.  Patient denies any suicidal ideation.  Past psychiatric medications:  Geodon, CBZ, risperidone, lithium 900, Zyprexa, Abilify, Topamax, gabapentin, perphenazine, loxapine, lamotrigine, Depakote 1500 sertraline, duloxetine,  citalopram, venlafaxine, Wellbutrin, , buspirone GI SE, hydroxyzine, Prosom, lorazepam, temazepam, trazodone. History of BZ dependence remotely.  Review of Systems:  Review of Systems  Musculoskeletal: Positive for arthralgias.  Neurological: Negative for tremors and weakness.  Psychiatric/Behavioral: Negative for agitation, behavioral problems, confusion, decreased concentration, dysphoric mood, hallucinations, self-injury, sleep disturbance and suicidal ideas. The patient is nervous/anxious. The patient is not hyperactive.     Medications: I have reviewed the patient's current medications.  Current Outpatient Medications  Medication Sig Dispense Refill  . amLODipine (NORVASC) 10 MG tablet Take 1 tablet (10 mg total) by mouth daily. 90 tablet 3  . atorvastatin (LIPITOR) 40 MG tablet TAKE 1 TABLET EACH DAY. 90 tablet 3  . cyclobenzaprine (FLEXERIL) 5 MG tablet Take 1 tablet (5 mg total) by mouth at bedtime. 90 tablet 3  . diphenoxylate-atropine (LOMOTIL) 2.5-0.025 MG tablet TAKE 1 TABLET  FOUR TIMES DAILY AS NEEDED FOR DIARRHEA/LOOSE STOOLS. 30 tablet 0  . divalproex (DEPAKOTE ER) 500 MG 24 hr tablet TAKE 3 TABLETS AT BEDTIME. 90 tablet 6  . gabapentin (NEURONTIN) 400 MG capsule TAKE 1 OR 2 CAPSULES THREE TIMES A DAY. 180 capsule 0  . HYDROcodone-acetaminophen (NORCO/VICODIN)  5-325 MG tablet Take 1 tablet by mouth every 8 (eight) hours. 90 tablet 0  . labetalol (NORMODYNE) 200 MG tablet TAKE 2 TABLETS IN THE MORNING AND 1 TABLET IN THE EVENING. 270 tablet 3  . oxybutynin (DITROPAN) 5 MG tablet TAKE 1/2 TO 1 TABLET TWICE DAILY. 180 tablet 0  . promethazine (PHENERGAN) 25 MG tablet TAKE 1 TABLET EVERY 8 HOURS AS NEEDED FOR NAUSEA AND VOMITING. 90 tablet 0  . triamcinolone cream (KENALOG) 0.1 % Apply 1 application 2 (two) times daily topically. To affected area(s) as needed 15 g 1   No current facility-administered medications for this visit.     Medication Side Effects: None  Allergies:  Allergies  Allergen Reactions  . Morphine Nausea And Vomiting    Past Medical History:  Diagnosis Date  . ANEMIA 11/12/2007   Qualifier: Diagnosis of  By: Briscoe Burns CMA, Alvy Beal    . Anxiety   . Arthritis   . ASTHMA NOS W/ACUTE EXACERBATION 01/13/2010   Qualifier: Diagnosis of  By: Yetta Barre MD, Bernadene Bell.   . BIPOLAR DISORDER UNSPECIFIED 10/29/2007   Qualifier: Diagnosis of  By: Yetta Barre MD, Bernadene Bell.   Marland Kitchen COMMON MIGRAINE 12/29/2008   Qualifier: Diagnosis of  By: Yetta Barre MD, Bernadene Bell.   Marland Kitchen GERD 07/28/2009   Qualifier: Diagnosis of  By: Felicity Coyer MD, Raenette Rover HYPERLIPIDEMIA 10/29/2007   Qualifier: Diagnosis of  By: Yetta Barre MD, Bernadene Bell.   . HYPERTENSION, BENIGN ESSENTIAL 10/29/2007   Qualifier: Diagnosis of  By: Yetta Barre MD, Bernadene Bell.   . OSTEOPENIA 10/30/2007   Qualifier: Diagnosis of  By: Yetta Barre MD, Bernadene Bell.   . OVERACTIVE BLADDER 02/21/2008   Qualifier: Diagnosis of  By: Jonny Ruiz MD, Len Blalock   . SLEEP APNEA 12/31/2009   Qualifier: Diagnosis of  By: Yetta Barre MD, Bernadene Bell.     Family History  Problem Relation Age of Onset  . Heart attack Mother   . Hypertension Mother   . Diabetes Mother   . Heart attack Father   . Hypertension Father   . Alcohol abuse Maternal Uncle   . Breast cancer Neg Hx     Social History   Socioeconomic History  . Marital status: Divorced    Spouse  name: Not on file  . Number of children: 2  . Years of education: 9  . Highest education level: Some college, no degree  Occupational History  . 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 Financial resource strain: Not hard at all  . Food insecurity    Worry: Never true    Inability: Never true  . Transportation needs    Medical: No    Non-medical: No  Tobacco Use  . Smoking status: Never Smoker  . Smokeless tobacco: Never Used  Substance and Sexual Activity  . Alcohol use: No  . Drug use: No  . Sexual activity: Not Currently  Lifestyle  . Physical activity    Days per week: 0 days    Minutes per session: 0 min  . Stress: Not at all  Relationships  . Social Musician on phone: Three times a week  Gets together: Never    Attends religious service: More than 4 times per year    Active member of club or organization: No    Attends meetings of clubs or organizations: Never    Relationship status: Divorced  . Intimate partner violence    Fear of current or ex partner: No    Emotionally abused: No    Physically abused: No    Forced sexual activity: No  Other Topics Concern  . Not on file  Social History Narrative   Take care of her dog.    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   Please see review of systems for further details on the patient's review from today.   Objective:   Physical Exam:  There were no vitals taken for this visit.  Physical Exam Neurological:     Mental Status: She is alert and oriented to person, place, and time.     Cranial Nerves: No dysarthria.  Psychiatric:        Attention and Perception: Attention normal.        Mood and Affect: Mood is anxious and depressed. Affect is tearful.        Behavior: Behavior is cooperative.        Thought Content: Thought content normal. Thought content is  not paranoid or delusional. Thought content does not include homicidal or suicidal ideation. Thought content does not include homicidal or suicidal plan.        Cognition and Memory: Cognition and memory normal.     Comments: Upset over getting scammed. Talkative over the scamming. Fair insight and judgment.    Lab Review:     Component Value Date/Time   NA 140 08/18/2017 0935   K 4.2 08/18/2017 0935   CL 105 08/18/2017 0935   CO2 29 08/18/2017 0935   GLUCOSE 101 (H) 08/18/2017 0935   BUN 13 08/18/2017 0935   CREATININE 0.65 08/18/2017 0935   CALCIUM 9.7 08/18/2017 0935   PROT 6.0 (L) 08/18/2017 0935   ALBUMIN 4.0 02/03/2016 0819   AST 10 08/18/2017 0935   ALT 9 08/18/2017 0935   ALKPHOS 52 02/03/2016 0819   BILITOT 1.0 08/18/2017 0935   GFRNONAA 91 08/18/2017 0935   GFRAA 106 08/18/2017 0935       Component Value Date/Time   WBC 4.3 02/28/2017 0910   RBC 3.82 02/28/2017 0910   HGB 12.2 02/28/2017 0910   HCT 35.4 02/28/2017 0910   PLT 183 02/28/2017 0910   MCV 92.7 02/28/2017 0910   MCV 94.2 06/06/2011 1113   MCH 31.9 02/28/2017 0910   MCHC 34.5 02/28/2017 0910   RDW 11.9 02/28/2017 0910   LYMPHSABS 1,470 02/03/2016 0819   MONOABS 637 02/03/2016 0819   EOSABS 245 02/03/2016 0819   BASOSABS 49 02/03/2016 0819    No results found for: POCLITH, LITHIUM   Lab Results  Component Value Date   VALPROATE 75.9 06/15/2015   CBMZ <0.3 (L) 02/01/2010     .res Assessment: Plan:    Bipolar I disorder (HCC)  Generalized anxiety disorder  Benzodiazepine abuse in remission (HCC)  Adjustment disorder with anxious mood   As noted patient has a long history of mood disorder and anxiety with multiple meds tried.  Depakote has been the most effective mood stabilizer and she has the best response at 1500 mg a day.  She is tolerating it  well.  Her anxiety is manageable at this time.  She has had compliance issues at times in the past but not in the last couple of  years.  There is no evidence of benzodiazepine abuse or dependence at this time.  Patient has committed to sobriety from benzodiazepines.  This is encouraged.  No evidence of liver problems from the Depakote.  She did not tolerate the buspirone so she stopped it.  We will not start new medications this visit.  Supportive therapy and boundary setting discussed in dealing with her drug addicted granddaughter.  This is been a chronic problem.  Also recent scamming by someone pretending to be [employer].  Got help and got it fixed but still upset and ashamed.  Since mood has been stable we will follow-up in 6 months  Meredith Staggers, MD, DFAPA  Please see After Visit Summary for patient specific instructions.  No future appointments.  No orders of the defined types were placed in this encounter.     -------------------------------

## 2018-11-10 ENCOUNTER — Other Ambulatory Visit: Payer: Self-pay | Admitting: Family Medicine

## 2018-11-10 ENCOUNTER — Encounter: Payer: Self-pay | Admitting: Osteopathic Medicine

## 2018-11-10 DIAGNOSIS — M5442 Lumbago with sciatica, left side: Secondary | ICD-10-CM

## 2018-11-10 DIAGNOSIS — G8929 Other chronic pain: Secondary | ICD-10-CM

## 2018-11-10 DIAGNOSIS — M5416 Radiculopathy, lumbar region: Secondary | ICD-10-CM

## 2018-11-12 MED ORDER — HYDROCODONE-ACETAMINOPHEN 5-325 MG PO TABS: 1.0000 | ORAL_TABLET | Freq: Three times a day (TID) | ORAL | 0 refills | Status: DC

## 2018-11-13 DIAGNOSIS — M47816 Spondylosis without myelopathy or radiculopathy, lumbar region: Secondary | ICD-10-CM | POA: Diagnosis not present

## 2018-11-13 DIAGNOSIS — R030 Elevated blood-pressure reading, without diagnosis of hypertension: Secondary | ICD-10-CM | POA: Diagnosis not present

## 2018-11-13 DIAGNOSIS — M545 Low back pain: Secondary | ICD-10-CM | POA: Diagnosis not present

## 2018-11-13 DIAGNOSIS — G8929 Other chronic pain: Secondary | ICD-10-CM | POA: Diagnosis not present

## 2018-11-21 ENCOUNTER — Other Ambulatory Visit: Payer: Self-pay | Admitting: Osteopathic Medicine

## 2018-11-28 ENCOUNTER — Encounter: Payer: Self-pay | Admitting: Osteopathic Medicine

## 2018-12-12 ENCOUNTER — Encounter: Payer: Self-pay | Admitting: Osteopathic Medicine

## 2018-12-12 ENCOUNTER — Other Ambulatory Visit: Payer: Self-pay | Admitting: Osteopathic Medicine

## 2018-12-12 DIAGNOSIS — G8929 Other chronic pain: Secondary | ICD-10-CM

## 2018-12-12 DIAGNOSIS — M5416 Radiculopathy, lumbar region: Secondary | ICD-10-CM

## 2018-12-12 NOTE — Telephone Encounter (Signed)
Requested medication (s) are due for refill today: yes  Requested medication (s) are on the active medication list: yes  Last refill:  11/12/2018  Future visit scheduled: no  Notes to clinic: refill cannot be delegated   Requested Prescriptions  Pending Prescriptions Disp Refills   promethazine (PHENERGAN) 25 MG tablet [Pharmacy Med Name: PROMETHAZINE 25 MG TABLET] 90 tablet 0    Sig: TAKE 1 TABLET EVERY 8 HOURS AS NEEDED FOR NAUSEA AND VOMITING.     Not Delegated - Gastroenterology: Antiemetics Failed - 12/12/2018  8:10 AM      Failed - This refill cannot be delegated      Passed - Valid encounter within last 6 months    Recent Outpatient Visits          4 months ago Lumbar radiculitis   [place], Lanelle Bal, DO   4 months ago Medication management   [facility], Lanelle Bal, DO   9 months ago Acute pain of right shoulder   [place] [facility], Gwen Her, MD   10 months ago Chronic bilateral low back pain with left-sided sciatica   [facility], Lanelle Bal, DO   1 year ago Chronic bilateral low back pain with left-sided sciatica   [facility], [place], DO

## 2018-12-13 MED ORDER — HYDROCODONE-ACETAMINOPHEN 5-325 MG PO TABS: 1.0000 | ORAL_TABLET | Freq: Three times a day (TID) | ORAL | 0 refills | Status: DC

## 2018-12-21 ENCOUNTER — Other Ambulatory Visit: Payer: Self-pay | Admitting: Osteopathic Medicine

## 2018-12-21 NOTE — Telephone Encounter (Signed)
Requested medication (s) are due for refill today: yes  Requested medication (s) are on the active medication list: yes  Last refill:  11/22/2018  Future visit scheduled: no  Notes to clinic: review for refill   Requested Prescriptions  Pending Prescriptions Disp Refills   gabapentin (NEURONTIN) 400 MG capsule [Pharmacy Med Name: GABAPENTIN 400 MG CAPSULE] 180 capsule 0    Sig: TAKE 1 OR 2 CAPSULES THREE TIMES A DAY.      Neurology: Anticonvulsants - gabapentin Passed - 12/21/2018  9:16 AM      Passed - Valid encounter within last 12 months    Recent Outpatient Visits           4 months ago Lumbar radiculitis   [place], Lanelle Bal, DO   4 months ago Medication management   [facility], Lanelle Bal, DO   10 months ago Acute pain of right shoulder   [place] [facility], Gwen Her, MD   10 months ago Chronic bilateral low back pain with left-sided sciatica   [facility], Lanelle Bal, DO   1 year ago Chronic bilateral low back pain with left-sided sciatica   [facility], [place], DO

## 2018-12-21 NOTE — Telephone Encounter (Signed)
Requested medication (s) are due for refill today: yes  Requested medication (s) are on the active medication list: yes  Last refill:  09/06/2018  Future visit scheduled:no  Notes to clinic:  refill cannot be delegated    Requested Prescriptions  Pending Prescriptions Disp Refills   diphenoxylate-atropine (LOMOTIL) 2.5-0.025 MG tablet [Pharmacy Med Name: DIPHENOXYLATE-ATROP 2.5-0.025] 30 tablet 0    Sig: TAKE 1 TABLET FOUR TIMES DAILY AS NEEDED FOR DIARRHEA/LOOSE STOOLS.      Not Delegated - Gastroenterology:  Antidiarrheals Failed - 12/21/2018 10:26 AM      Failed - This refill cannot be delegated      Passed - Valid encounter within last 12 months    Recent Outpatient Visits           4 months ago Lumbar radiculitis   [place], Lanelle Bal, DO   4 months ago Medication management   [facility], Lanelle Bal, DO   10 months ago Acute pain of right shoulder   [place] [facility], Gwen Her, MD   10 months ago Chronic bilateral low back pain with left-sided sciatica   [facility], Lanelle Bal, DO   1 year ago Chronic bilateral low back pain with left-sided sciatica   [facility], [place], DO

## 2019-01-01 ENCOUNTER — Other Ambulatory Visit: Payer: Self-pay | Admitting: Psychiatry

## 2019-01-10 ENCOUNTER — Other Ambulatory Visit: Payer: Self-pay | Admitting: Osteopathic Medicine

## 2019-01-10 NOTE — Telephone Encounter (Signed)
Requested medication (s) are due for refill today: yes  Requested medication (s) are on the active medication list: yes  Last refill:  12/12/2018  Future visit scheduled: no  Notes to clinic: this refill cannot be delegated   Requested Prescriptions  Pending Prescriptions Disp Refills   promethazine (PHENERGAN) 25 MG tablet [Pharmacy Med Name: PROMETHAZINE 25 MG TABLET] 90 tablet 0    Sig: TAKE 1 TABLET EVERY 8 HOURS AS NEEDED FOR NAUSEA AND VOMITING.      Not Delegated - Gastroenterology: Antiemetics Failed - 01/10/2019  9:06 AM      Failed - This refill cannot be delegated      Passed - Valid encounter within last 6 months    Recent Outpatient Visits           5 months ago Lumbar radiculitis   [place], Lanelle Bal, DO   5 months ago Medication management   [facility], Lanelle Bal, DO   10 months ago Acute pain of right shoulder   [place] [facility], Gwen Her, MD   11 months ago Chronic bilateral low back pain with left-sided sciatica   [facility], Lanelle Bal, DO   1 year ago Chronic bilateral low back pain with left-sided sciatica   [facility] At [facility], [place], DO

## 2019-01-12 ENCOUNTER — Encounter: Payer: Self-pay | Admitting: Osteopathic Medicine

## 2019-01-12 DIAGNOSIS — G8929 Other chronic pain: Secondary | ICD-10-CM

## 2019-01-12 DIAGNOSIS — M5416 Radiculopathy, lumbar region: Secondary | ICD-10-CM

## 2019-01-12 DIAGNOSIS — M5442 Lumbago with sciatica, left side: Secondary | ICD-10-CM

## 2019-01-14 MED ORDER — HYDROCODONE-ACETAMINOPHEN 5-325 MG PO TABS: 1.0000 | ORAL_TABLET | Freq: Three times a day (TID) | ORAL | 0 refills | Status: DC

## 2019-01-14 NOTE — Telephone Encounter (Signed)
Pended.

## 2019-01-18 DIAGNOSIS — H5213 Myopia, bilateral: Secondary | ICD-10-CM | POA: Diagnosis not present

## 2019-01-18 DIAGNOSIS — H52203 Unspecified astigmatism, bilateral: Secondary | ICD-10-CM | POA: Diagnosis not present

## 2019-01-18 DIAGNOSIS — H2513 Age-related nuclear cataract, bilateral: Secondary | ICD-10-CM | POA: Diagnosis not present

## 2019-01-29 ENCOUNTER — Other Ambulatory Visit: Payer: Self-pay | Admitting: Osteopathic Medicine

## 2019-01-30 DIAGNOSIS — Z0120 Encounter for dental examination and cleaning without abnormal findings: Secondary | ICD-10-CM | POA: Diagnosis not present

## 2019-02-05 ENCOUNTER — Ambulatory Visit: Payer: Medicare Other

## 2019-02-10 ENCOUNTER — Encounter: Payer: Self-pay | Admitting: Osteopathic Medicine

## 2019-02-10 DIAGNOSIS — G8929 Other chronic pain: Secondary | ICD-10-CM

## 2019-02-10 DIAGNOSIS — M5416 Radiculopathy, lumbar region: Secondary | ICD-10-CM

## 2019-02-10 DIAGNOSIS — M5442 Lumbago with sciatica, left side: Secondary | ICD-10-CM

## 2019-02-11 MED ORDER — HYDROCODONE-ACETAMINOPHEN 5-325 MG PO TABS: 1.0000 | ORAL_TABLET | Freq: Three times a day (TID) | ORAL | 0 refills | Status: DC

## 2019-02-13 ENCOUNTER — Other Ambulatory Visit: Payer: Self-pay | Admitting: Osteopathic Medicine

## 2019-02-13 ENCOUNTER — Other Ambulatory Visit: Payer: Self-pay | Admitting: Physician Assistant

## 2019-02-14 ENCOUNTER — Ambulatory Visit: Payer: Medicare Other | Attending: Internal Medicine

## 2019-02-14 DIAGNOSIS — Z23 Encounter for immunization: Secondary | ICD-10-CM | POA: Insufficient documentation

## 2019-02-14 NOTE — Progress Notes (Signed)
   Covid-19 Vaccination Clinic  Name:  Donna Sexton    MRN: [id number] DOB: Jul 03, 1950  02/14/2019  [name] was observed post Covid-19 immunization for 15 minutes without incidence. She was provided with Vaccine Information Sheet and instruction to access the V-Safe system.   [name] was instructed to call 911 with any severe reactions post vaccine: Marland Kitchen Difficulty breathing  . Swelling of your face and throat  . A fast heartbeat  . A bad rash all over your body  . Dizziness and weakness    Immunizations Administered    Name Date Dose VIS Date Route   Pfizer COVID-19 Vaccine 02/14/2019  5:26 PM 0.3 mL 12/21/2018 Intramuscular   Manufacturer: [employer], [employer]   Lot: [id number]   NDC: [address]-1

## 2019-02-22 ENCOUNTER — Ambulatory Visit: Payer: Medicare Other

## 2019-02-24 DIAGNOSIS — Z789 Other specified health status: Secondary | ICD-10-CM | POA: Insufficient documentation

## 2019-03-06 DIAGNOSIS — M25511 Pain in right shoulder: Secondary | ICD-10-CM | POA: Diagnosis not present

## 2019-03-11 ENCOUNTER — Encounter: Payer: Self-pay | Admitting: Osteopathic Medicine

## 2019-03-11 DIAGNOSIS — G8929 Other chronic pain: Secondary | ICD-10-CM

## 2019-03-11 DIAGNOSIS — M5442 Lumbago with sciatica, left side: Secondary | ICD-10-CM

## 2019-03-11 DIAGNOSIS — M5416 Radiculopathy, lumbar region: Secondary | ICD-10-CM

## 2019-03-12 ENCOUNTER — Ambulatory Visit: Payer: Medicare Other | Attending: Internal Medicine

## 2019-03-12 DIAGNOSIS — Z23 Encounter for immunization: Secondary | ICD-10-CM | POA: Insufficient documentation

## 2019-03-12 MED ORDER — HYDROCODONE-ACETAMINOPHEN 5-325 MG PO TABS: 1.0000 | ORAL_TABLET | Freq: Three times a day (TID) | ORAL | 0 refills | Status: DC

## 2019-03-12 NOTE — Progress Notes (Signed)
   Covid-19 Vaccination Clinic  Name:  Donna Sexton    MRN: [id number] DOB: December 17, 1950  03/12/2019  Donna Sexton was observed post Covid-19 immunization for 15 minutes without incident. She was provided with Vaccine Information Sheet and instruction to access the V-Safe system.   Donna Sexton was instructed to call 911 with any severe reactions post vaccine: Marland Kitchen Difficulty breathing  . Swelling of face and throat  . A fast heartbeat  . A bad rash all over body  . Dizziness and weakness   Immunizations Administered    Name Date Dose VIS Date Route   Pfizer COVID-19 Vaccine 03/12/2019  8:35 AM 0.3 mL 12/21/2018 Intramuscular   Manufacturer: [employer], [employer]   Lot: [id number]   NDC: [address]-5

## 2019-03-13 ENCOUNTER — Other Ambulatory Visit: Payer: Self-pay | Admitting: Osteopathic Medicine

## 2019-03-13 NOTE — Telephone Encounter (Signed)
Requested medication (s) are due for refill today: no  Requested medication (s) are on the active medication list: yes  Last refill:  12/21/2018  Future visit scheduled: no  Notes to clinic: this refill cannot be delegated    Requested Prescriptions  Pending Prescriptions Disp Refills   diphenoxylate-atropine (LOMOTIL) 2.5-0.025 MG tablet [Pharmacy Med Name: DIPHENOXYLATE-ATROP 2.5-0.025] 30 tablet 0    Sig: TAKE 1 TABLET FOUR TIMES DAILY AS NEEDED FOR DIARRHEA/LOOSE STOOLS.      Not Delegated - Gastroenterology:  Antidiarrheals Failed - 03/13/2019  8:26 AM      Failed - This refill cannot be delegated      Passed - Valid encounter within last 12 months    Recent Outpatient Visits           7 months ago Lumbar radiculitis   [place] [facility], Dorene Grebe, DO   7 months ago Medication management   [place] [facility], Dorene Grebe, DO   1 year ago Acute pain of right shoulder   [place] [facility], Ihor Austin, MD   1 year ago Chronic bilateral low back pain with left-sided sciatica   [place] [facility], Dorene Grebe, DO   1 year ago Chronic bilateral low back pain with left-sided sciatica   [facility] At [facility], [place], DO

## 2019-03-17 ENCOUNTER — Other Ambulatory Visit: Payer: Self-pay | Admitting: Osteopathic Medicine

## 2019-03-17 NOTE — Telephone Encounter (Signed)
Requested medication (s) are due for refill today: yes  Requested medication (s) are on the active medication list: yes  Last refill: 02/14/2019  Future visit scheduled: no  Notes to clinic:  This refill cannot be delegated    Requested Prescriptions  Pending Prescriptions Disp Refills   promethazine (PHENERGAN) 25 MG tablet [Pharmacy Med Name: PROMETHAZINE 25 MG TABLET] 90 tablet 0    Sig: TAKE 1 TABLET EVERY 8 HOURS AS NEEDED FOR NAUSEA AND VOMITING.      Not Delegated - Gastroenterology: Antiemetics Failed - 03/17/2019 12:16 PM      Failed - This refill cannot be delegated      Failed - Valid encounter within last 6 months    Recent Outpatient Visits           7 months ago Lumbar radiculitis   [place] [facility], Dorene Grebe, DO   7 months ago Medication management   [place] [facility], Dorene Grebe, DO   1 year ago Acute pain of right shoulder   [place] [facility], Ihor Austin, MD   1 year ago Chronic bilateral low back pain with left-sided sciatica   [place] [facility], Dorene Grebe, DO   1 year ago Chronic bilateral low back pain with left-sided sciatica   [facility], [place], DO

## 2019-03-22 DIAGNOSIS — M25511 Pain in right shoulder: Secondary | ICD-10-CM | POA: Diagnosis not present

## 2019-03-25 DIAGNOSIS — Z01818 Encounter for other preprocedural examination: Secondary | ICD-10-CM | POA: Diagnosis not present

## 2019-03-27 ENCOUNTER — Other Ambulatory Visit: Payer: Self-pay | Admitting: Osteopathic Medicine

## 2019-03-28 DIAGNOSIS — M24111 Other articular cartilage disorders, right shoulder: Secondary | ICD-10-CM | POA: Diagnosis not present

## 2019-03-28 DIAGNOSIS — M25561 Pain in right knee: Secondary | ICD-10-CM | POA: Diagnosis not present

## 2019-03-28 DIAGNOSIS — J986 Disorders of diaphragm: Secondary | ICD-10-CM | POA: Diagnosis not present

## 2019-03-28 DIAGNOSIS — R0602 Shortness of breath: Secondary | ICD-10-CM | POA: Diagnosis not present

## 2019-03-28 DIAGNOSIS — J9811 Atelectasis: Secondary | ICD-10-CM | POA: Diagnosis not present

## 2019-03-28 DIAGNOSIS — M75121 Complete rotator cuff tear or rupture of right shoulder, not specified as traumatic: Secondary | ICD-10-CM | POA: Diagnosis not present

## 2019-03-28 DIAGNOSIS — M25562 Pain in left knee: Secondary | ICD-10-CM | POA: Diagnosis not present

## 2019-03-28 DIAGNOSIS — M19011 Primary osteoarthritis, right shoulder: Secondary | ICD-10-CM | POA: Diagnosis not present

## 2019-03-28 DIAGNOSIS — G8918 Other acute postprocedural pain: Secondary | ICD-10-CM | POA: Diagnosis not present

## 2019-03-28 DIAGNOSIS — M7541 Impingement syndrome of right shoulder: Secondary | ICD-10-CM | POA: Diagnosis not present

## 2019-04-12 ENCOUNTER — Encounter: Payer: Self-pay | Admitting: Osteopathic Medicine

## 2019-04-12 DIAGNOSIS — G8929 Other chronic pain: Secondary | ICD-10-CM

## 2019-04-12 DIAGNOSIS — M5416 Radiculopathy, lumbar region: Secondary | ICD-10-CM

## 2019-04-15 MED ORDER — HYDROCODONE-ACETAMINOPHEN 5-325 MG PO TABS: 1.0000 | ORAL_TABLET | Freq: Three times a day (TID) | ORAL | 0 refills | Status: DC

## 2019-04-15 NOTE — Telephone Encounter (Signed)
RX pended with future fill date on 04/18/19

## 2019-04-21 ENCOUNTER — Other Ambulatory Visit: Payer: Self-pay | Admitting: Osteopathic Medicine

## 2019-05-02 DIAGNOSIS — Z789 Other specified health status: Secondary | ICD-10-CM | POA: Diagnosis not present

## 2019-05-02 DIAGNOSIS — M6281 Muscle weakness (generalized): Secondary | ICD-10-CM | POA: Diagnosis not present

## 2019-05-02 DIAGNOSIS — M25511 Pain in right shoulder: Secondary | ICD-10-CM | POA: Diagnosis not present

## 2019-05-08 DIAGNOSIS — M6281 Muscle weakness (generalized): Secondary | ICD-10-CM | POA: Diagnosis not present

## 2019-05-08 DIAGNOSIS — M25511 Pain in right shoulder: Secondary | ICD-10-CM | POA: Diagnosis not present

## 2019-05-08 DIAGNOSIS — Z789 Other specified health status: Secondary | ICD-10-CM | POA: Diagnosis not present

## 2019-05-10 DIAGNOSIS — M6281 Muscle weakness (generalized): Secondary | ICD-10-CM | POA: Diagnosis not present

## 2019-05-10 DIAGNOSIS — M25511 Pain in right shoulder: Secondary | ICD-10-CM | POA: Diagnosis not present

## 2019-05-10 DIAGNOSIS — Z789 Other specified health status: Secondary | ICD-10-CM | POA: Diagnosis not present

## 2019-05-11 ENCOUNTER — Encounter: Payer: Self-pay | Admitting: Osteopathic Medicine

## 2019-05-13 ENCOUNTER — Encounter: Payer: Self-pay | Admitting: Osteopathic Medicine

## 2019-05-13 DIAGNOSIS — M25511 Pain in right shoulder: Secondary | ICD-10-CM | POA: Diagnosis not present

## 2019-05-13 DIAGNOSIS — M6281 Muscle weakness (generalized): Secondary | ICD-10-CM | POA: Diagnosis not present

## 2019-05-13 DIAGNOSIS — Z789 Other specified health status: Secondary | ICD-10-CM | POA: Diagnosis not present

## 2019-05-13 NOTE — Telephone Encounter (Signed)
Patient is very past due for med follow up. This appointment needs to be done in office with Dr Lyn Hollingshead. OK to use one of her acute spots on Monday for this when patient calls

## 2019-05-14 ENCOUNTER — Encounter: Payer: Self-pay | Admitting: Osteopathic Medicine

## 2019-05-14 NOTE — Progress Notes (Deleted)
Subjective:    CC: pain contract, urine drug screen  HPI: 69 year old female presenting today to renew pain contract and complete a urine drug screen.   I reviewed the past medical history, family history, social history, surgical history, and allergies today and no changes were needed.  Please see the problem list section below in epic for further details.  Past Medical History: Past Medical History:  Diagnosis Date  . ANEMIA 11/12/2007   Qualifier: Diagnosis of  By: Tiney Rouge CMA, Ellison Hughs    . Anxiety   . Arthritis   . ASTHMA NOS W/ACUTE EXACERBATION 01/13/2010   Qualifier: Diagnosis of  By: Ronnald Ramp MD, Arvid Right.   . BIPOLAR DISORDER UNSPECIFIED 10/29/2007   Qualifier: Diagnosis of  By: Ronnald Ramp MD, Arvid Right.   Marland Kitchen COMMON MIGRAINE 12/29/2008   Qualifier: Diagnosis of  By: Ronnald Ramp MD, Arvid Right.   Marland Kitchen GERD 07/28/2009   Qualifier: Diagnosis of  By: Asa Lente MD, Jannifer Rodney HYPERLIPIDEMIA 10/29/2007   Qualifier: Diagnosis of  By: Ronnald Ramp MD, Arvid Right.   . HYPERTENSION, BENIGN ESSENTIAL 10/29/2007   Qualifier: Diagnosis of  By: Ronnald Ramp MD, [place] OSTEOPENIA 10/30/2007   Qualifier: Diagnosis of  By: Ronnald Ramp MD, [place] BLADDER 02/21/2008   Qualifier: Diagnosis of  By: Jenny Reichmann MD, Hunt Oris   . SLEEP APNEA 12/31/2009   Qualifier: Diagnosis of  By: Ronnald Ramp MD, Arvid Right.    Past Surgical History: Past Surgical History:  Procedure Laterality Date  . ABDOMINAL HYSTERECTOMY    . BREAST SURGERY  1980   implants bilateral  . CARPAL TUNNEL RELEASE    . CHOLECYSTECTOMY    . HERNIA REPAIR    . PAROTID ENDOSCOPY    . TUBAL LIGATION     Social History: Social History   Socioeconomic History  . Marital status: Divorced    Spouse name: Not on file  . Number of children: 2  . Years of education: 9  . Highest education level: Some college, no degree  Occupational History  . 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 Smoking status: Never Smoker  . Smokeless  tobacco: Never Used  Substance and Sexual Activity  . Alcohol use: No  . Drug use: No  . Sexual activity: Not Currently  Other Topics Concern  . Not on file  Social History Narrative   Take care of her dog.    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 Difficulty of Paying Living Expenses: Not hard at all  Food Insecurity: No Food Insecurity  . Worried About Charity fundraiser in the Last Year: Never true  . Ran Out of Food in the Last Year: Never true  Transportation Needs: No Transportation Needs  . Lack of Transportation (Medical): No  . Lack of Transportation (Non-Medical): No  Physical Activity: Inactive  . Days of Exercise per Week: 0 days  . Minutes of Exercise per Session: 0 min  Stress: No Stress Concern Present  . Feeling of Stress : Not at all  Social Connections: Somewhat Isolated  . Frequency of Communication with Friends and Family: Three times a week  . Frequency of Social Gatherings with Friends and Family: Never  . Attends Religious Services: More than 4 times per year  . Active Member of Clubs or Organizations: No  . Attends Archivist  Meetings: Never  . Marital Status: Divorced   Family History: Family History  Problem Relation Age of Onset  . Heart attack Mother   . Hypertension Mother   . Diabetes Mother   . Heart attack Father   . Hypertension Father   . Alcohol abuse Maternal Uncle   . Breast cancer Neg Hx    Allergies: Allergies  Allergen Reactions  . Other Other (See Comments)    Allergic to muscle relaxers -unknown reaction Flexeril only can take cyclobenzaprine.  . Morphine Nausea And Vomiting   Medications: See med rec.  Review of Systems: No fevers, chills, night sweats, weight loss, chest pain, or shortness of breath.   Objective:    General: Well Developed, well nourished, and in no acute distress.  Neuro: Alert and oriented x3, extra-ocular muscles  intact, sensation grossly intact.  HEENT: Normocephalic, atraumatic, pupils equal round reactive to light, neck supple, no masses, no lymphadenopathy, thyroid nonpalpable.  Skin: Warm and dry, no rashes. Cardiac: Regular rate and rhythm, no murmurs rubs or gallops, no lower extremity edema.  Respiratory: Clear to auscultation bilaterally. Not using accessory muscles, speaking in full sentences.   Impression and Recommendations:    No problem-specific Assessment & Plan notes found for this encounter.   ___________________________________________ Thayer Ohm, DNP, APRN, FNP-BC Primary Care and Sports Medicine [facility]

## 2019-05-14 NOTE — Telephone Encounter (Signed)
Patient on schedule with Joy tomorrow to renew pain contract and do UDS. Ander Slade has been advised of the situation. FYI to Joy about her recent medication refill from surgeon (she had surgery in March so unsure why she is still receiving pain meds from him. I advised her this in itself was a breech of contract).

## 2019-05-14 NOTE — Telephone Encounter (Signed)
Has gotten pain medications from Dr. Alba Cory or the surgeon who did her arthroscopy at [facility].  It looks like she is gotten a total of 60 tabs in the last month of oxycodone.

## 2019-05-14 NOTE — Telephone Encounter (Signed)
Discussed further with Dr Linford Arnold after looking at other phone note

## 2019-05-14 NOTE — Telephone Encounter (Signed)
This was from the virtual visit in July Dr. Lyn Hollingshead:  "Also doing well on current medications otherwise, including pain medicines.  I am okay to count this as our check-in for pain as well, plan to touch base again in 3 months to maintain prescription pain medications, let me know sooner if anything else comes up".  So yes she needs an appointment.  If she is going to run out we can try to get her in with Joy or Di Kindle and at least get her caught up on the pain contract and get it up-to-date as well as UDS, opioid risk score etc.

## 2019-05-15 ENCOUNTER — Ambulatory Visit (INDEPENDENT_AMBULATORY_CARE_PROVIDER_SITE_OTHER): Payer: Medicare Other | Admitting: Medical-Surgical

## 2019-05-15 DIAGNOSIS — Z789 Other specified health status: Secondary | ICD-10-CM | POA: Diagnosis not present

## 2019-05-15 DIAGNOSIS — Z5329 Procedure and treatment not carried out because of patient's decision for other reasons: Secondary | ICD-10-CM

## 2019-05-15 DIAGNOSIS — M6281 Muscle weakness (generalized): Secondary | ICD-10-CM | POA: Diagnosis not present

## 2019-05-15 DIAGNOSIS — M25511 Pain in right shoulder: Secondary | ICD-10-CM | POA: Diagnosis not present

## 2019-05-15 NOTE — Telephone Encounter (Signed)
She cancelled her appointment today. Per Dr Linford Arnold, patient HAS to come in this week because she has to do a urine drug screen. If she chooses to do appointment later, that will count as refusal for urine drug screen and that is a breach in pain contract and she will have to be seen by pain management for any narcotic medications. I explained this to patient yesterday and she is already aware of all of this.Marland Kitchen

## 2019-05-15 NOTE — Telephone Encounter (Signed)
Discussed plan of care with Patient. She is aware that a drug test needs to be completed by the end of the week. Scheduled at 3pm 05/16/19

## 2019-05-15 NOTE — Telephone Encounter (Signed)
PT is requesting a earlier appointment with her PCP(pain med management). Sadly Dr. Amanda Pea schedule is booked up to the 18th. What should be the plan of care?  Can she see another provider for said issue? (she declined wanting to see another provider)  Please advise.

## 2019-05-15 NOTE — Progress Notes (Signed)
Subjective:    CC: Chronic narcotic use contract  HPI: 69 year old female presenting today to discuss chronic narcotic use.  Has been on a pain contract with Dr. Sheppard Coil but was recently found to be receiving opioid pain medications from her surgeon.  Presenting today to renew her pain contract and to have a urine drug screen completed.  Patient reports that she did not know that her receiving narcotic pain medications after her surgery from her surgeon was a violation of her pain contract.  Currently she reports that she does have oxycodone at home and that she has been "saving them", using them only at night after she is done her physical therapy.  Denies taking oxycodone and hydrocodone together.  Per PDMP Fill Date ID   Written Drug Qty Days Prescriber Rx # Pharmacy Refill   Daily Dose* Pymt Type PMP    05/14/2019  1   05/14/2019  Oxycodone Hcl 5 MG Tablet  20.00  4 Mi Lau   [id number]   Gat (0700)   0  37.50 MME  Comm Ins   [place]  05/01/2019  1   05/01/2019  Oxycodone Hcl 5 MG Tablet  20.00  3 Mi Lau   [id number]   Gat (0700)   0  50.00 MME  Comm Ins   [place]  04/15/2019  1   04/15/2019  Hydrocodone-Acetamin 5-325 MG  90.00  30 Na Ale   [id number]   Gat (0700)   0  15.00 MME  Comm Ins   [place]  04/04/2019  1   04/04/2019  Oxycodone Hcl 5 MG Tablet  20.00  3 Ka [place]   [id number]   Gat (0700)   0  50.00 MME  Comm Ins   [place]  03/28/2019  1   03/28/2019  Oxycodone Hcl 5 MG Tablet  20.00  5 Mi Lau   [id number]   Gat (0700)   0  30.00 MME  Comm Ins   [place]  03/19/2019  1   03/12/2019  Hydrocodone-Acetamin 5-325 MG  90.00  30 Na Ale   [id number]   Gat (0700)   0  15.00 MME  Comm Ins        I reviewed the past medical history, family history, social history, surgical history, and allergies today and no changes were needed.  Please see the problem list section below in epic for further details.  Past Medical History: Past Medical History:  Diagnosis Date  . ANEMIA 11/12/2007   Qualifier: Diagnosis of  By: Tiney Rouge CMA,  Ellison Hughs    . Anxiety   . Arthritis   . ASTHMA NOS W/ACUTE EXACERBATION 01/13/2010   Qualifier: Diagnosis of  By: Ronnald Ramp MD, Arvid Right.   . BIPOLAR DISORDER UNSPECIFIED 10/29/2007   Qualifier: Diagnosis of  By: Ronnald Ramp MD, Arvid Right.   Marland Kitchen COMMON MIGRAINE 12/29/2008   Qualifier: Diagnosis of  By: Ronnald Ramp MD, Arvid Right.   Marland Kitchen GERD 07/28/2009   Qualifier: Diagnosis of  By: Asa Lente MD, Jannifer Rodney HYPERLIPIDEMIA 10/29/2007   Qualifier: Diagnosis of  By: Ronnald Ramp MD, Arvid Right.   . HYPERTENSION, BENIGN ESSENTIAL 10/29/2007   Qualifier: Diagnosis of  By: Ronnald Ramp MD, [place] OSTEOPENIA 10/30/2007   Qualifier: Diagnosis of  By: Ronnald Ramp MD, [place] BLADDER 02/21/2008   Qualifier: Diagnosis of  By: Jenny Reichmann MD, Hunt Oris   . SLEEP APNEA 12/31/2009   Qualifier: Diagnosis of  By: Ronnald Ramp MD, Arvid Right.  Past Surgical History: Past Surgical History:  Procedure Laterality Date  . ABDOMINAL HYSTERECTOMY    . BREAST SURGERY  1980   implants bilateral  . CARPAL TUNNEL RELEASE    . CHOLECYSTECTOMY    . HERNIA REPAIR    . PAROTID ENDOSCOPY    . TUBAL LIGATION     Social History: Social History   Socioeconomic History  . Marital status: Divorced    Spouse name: Not on file  . Number of children: 2  . Years of education: 9  . Highest education level: Some college, no degree  Occupational History  . 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 Smoking status: Never Smoker  . Smokeless tobacco: Never Used  Substance and Sexual Activity  . Alcohol use: No  . Drug use: No  . Sexual activity: Not Currently  Other Topics Concern  . Not on file  Social History Narrative   Take care of her dog.    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 Difficulty of Paying Living Expenses: Not hard at all  Food Insecurity: No Food Insecurity  . Worried About Programme researcher, broadcasting/film/video in the Last Year: Never true  . Ran Out  of Food in the Last Year: Never true  Transportation Needs: No Transportation Needs  . Lack of Transportation (Medical): No  . Lack of Transportation (Non-Medical): No  Physical Activity: Inactive  . Days of Exercise per Week: 0 days  . Minutes of Exercise per Session: 0 min  Stress: No Stress Concern Present  . Feeling of Stress : Not at all  Social Connections: Somewhat Isolated  . Frequency of Communication with Friends and Family: Three times a week  . Frequency of Social Gatherings with Friends and Family: Never  . Attends Religious Services: More than 4 times per year  . Active Member of Clubs or Organizations: No  . Attends Banker Meetings: Never  . Marital Status: Divorced   Family History: Family History  Problem Relation Age of Onset  . Heart attack Mother   . Hypertension Mother   . Diabetes Mother   . Heart attack Father   . Hypertension Father   . Alcohol abuse Maternal Uncle   . Breast cancer Neg Hx    Allergies: Allergies  Allergen Reactions  . Other Other (See Comments)    Allergic to muscle relaxers -unknown reaction Flexeril only can take cyclobenzaprine.  . Morphine Nausea And Vomiting   Medications: See med rec.  Review of Systems: No fevers, chills, night sweats, weight loss, chest pain, or shortness of breath.   Objective:    General: Well Developed, well nourished, and in no acute distress.  Neuro: Alert and oriented x3.  HEENT: Normocephalic, atraumatic.  Skin: Warm and dry. Cardiac: Regular rate and rhythm, no murmurs rubs or gallops, no lower extremity edema.  Respiratory: Clear to auscultation bilaterally. Not using accessory muscles, speaking in full sentences.   Impression and Recommendations:    1. Need for 23-polyvalent pneumococcal polysaccharide vaccine Pneumovax given in office today by MA. - Pneumococcal polysaccharide vaccine 23-valent greater than or equal to 2yo subcutaneous/IM  2. Chronic narcotic use Urine  drug screen sample collected.  Patient provided with narcotic contract, allowed to read and ask questions.  All questions answered and patient verbalized understanding.  Patient signed contract and was given a copy to take  home for her records.  Advised patient that we are able to pull report that notes all of her controlled substances that she may have had filled, along with the information of who prescribed them, when, and where.  Advised patient to take no more of the oxycodone as this will be in violation of her pain contract that she signed today.  Refills of her hydrocodone will be provided at her PCP's discretion once the results of drug screen are back back. - DRUG MONITORING, PANEL 6 WITH CONFIRMATION, URINE  Return for as directed by PCP. ___________________________________________ Thayer Ohm, DNP, APRN, FNP-BC Primary Care and Sports Medicine [facility]

## 2019-05-16 ENCOUNTER — Encounter: Payer: Self-pay | Admitting: Medical-Surgical

## 2019-05-16 ENCOUNTER — Ambulatory Visit (INDEPENDENT_AMBULATORY_CARE_PROVIDER_SITE_OTHER): Payer: Medicare Other | Admitting: Medical-Surgical

## 2019-05-16 ENCOUNTER — Other Ambulatory Visit: Payer: Self-pay

## 2019-05-16 VITALS — BP 130/89 | HR 77 | Temp 97.7°F | Wt 220.4 lb

## 2019-05-16 DIAGNOSIS — Z23 Encounter for immunization: Secondary | ICD-10-CM | POA: Diagnosis not present

## 2019-05-16 DIAGNOSIS — F1190 Opioid use, unspecified, uncomplicated: Secondary | ICD-10-CM

## 2019-05-20 LAB — DRUG MONITORING, PANEL 6 WITH CONFIRMATION, URINE
6 Acetylmorphine: NEGATIVE ng/mL (ref <10)
Alcohol Metabolites: NEGATIVE ng/mL
Amphetamines: NEGATIVE ng/mL (ref <500)
Barbiturates: NEGATIVE ng/mL (ref <300)
Benzodiazepines: NEGATIVE ng/mL (ref <100)
Cocaine Metabolite: NEGATIVE ng/mL (ref <150)
Codeine: NEGATIVE ng/mL (ref <50)
Creatinine: 23.1 mg/dL
Hydrocodone: NEGATIVE ng/mL (ref <50)
Hydromorphone: NEGATIVE ng/mL (ref <50)
Marijuana Metabolite: NEGATIVE ng/mL (ref <20)
Methadone Metabolite: NEGATIVE ng/mL (ref <100)
Morphine: NEGATIVE ng/mL (ref <50)
Norhydrocodone: NEGATIVE ng/mL (ref <50)
Noroxycodone: 443 ng/mL — ABNORMAL HIGH (ref <50)
Opiates: NEGATIVE ng/mL (ref <100)
Oxidant: NEGATIVE ug/mL
Oxycodone: 232 ng/mL — ABNORMAL HIGH (ref <50)
Oxycodone: POSITIVE ng/mL — AB (ref <100)
Oxymorphone: NEGATIVE ng/mL (ref <50)
Phencyclidine: NEGATIVE ng/mL (ref <25)
pH: 7 (ref 4.5–9.0)

## 2019-05-20 LAB — DM TEMPLATE

## 2019-05-22 DIAGNOSIS — Z789 Other specified health status: Secondary | ICD-10-CM | POA: Diagnosis not present

## 2019-05-22 DIAGNOSIS — M25511 Pain in right shoulder: Secondary | ICD-10-CM | POA: Diagnosis not present

## 2019-05-22 DIAGNOSIS — M6281 Muscle weakness (generalized): Secondary | ICD-10-CM | POA: Diagnosis not present

## 2019-05-23 ENCOUNTER — Other Ambulatory Visit: Payer: Self-pay | Admitting: Osteopathic Medicine

## 2019-05-23 ENCOUNTER — Encounter (INDEPENDENT_AMBULATORY_CARE_PROVIDER_SITE_OTHER): Payer: Medicare Other | Admitting: Osteopathic Medicine

## 2019-05-23 DIAGNOSIS — G8929 Other chronic pain: Secondary | ICD-10-CM

## 2019-05-23 DIAGNOSIS — M5416 Radiculopathy, lumbar region: Secondary | ICD-10-CM | POA: Diagnosis not present

## 2019-05-23 MED ORDER — HYDROCODONE-ACETAMINOPHEN 5-325 MG PO TABS: 1.0000 | ORAL_TABLET | Freq: Every day | ORAL | 0 refills | Status: DC | PRN

## 2019-05-23 NOTE — Telephone Encounter (Signed)
Total time 11 minutes spent Billed appropriately

## 2019-05-29 DIAGNOSIS — M25511 Pain in right shoulder: Secondary | ICD-10-CM | POA: Diagnosis not present

## 2019-05-29 DIAGNOSIS — M6281 Muscle weakness (generalized): Secondary | ICD-10-CM | POA: Diagnosis not present

## 2019-05-29 DIAGNOSIS — Z789 Other specified health status: Secondary | ICD-10-CM | POA: Diagnosis not present

## 2019-06-18 ENCOUNTER — Encounter: Payer: Self-pay | Admitting: Osteopathic Medicine

## 2019-06-18 DIAGNOSIS — G8929 Other chronic pain: Secondary | ICD-10-CM

## 2019-06-18 DIAGNOSIS — M5442 Lumbago with sciatica, left side: Secondary | ICD-10-CM

## 2019-06-18 DIAGNOSIS — M5416 Radiculopathy, lumbar region: Secondary | ICD-10-CM

## 2019-06-20 MED ORDER — HYDROCODONE-ACETAMINOPHEN 5-325 MG PO TABS: 1.0000 | ORAL_TABLET | Freq: Every day | ORAL | 0 refills | Status: DC | PRN

## 2019-06-24 ENCOUNTER — Other Ambulatory Visit: Payer: Self-pay | Admitting: Osteopathic Medicine

## 2019-06-24 DIAGNOSIS — D2272 Melanocytic nevi of left lower limb, including hip: Secondary | ICD-10-CM | POA: Diagnosis not present

## 2019-06-24 DIAGNOSIS — L821 Other seborrheic keratosis: Secondary | ICD-10-CM | POA: Diagnosis not present

## 2019-06-24 DIAGNOSIS — D2271 Melanocytic nevi of right lower limb, including hip: Secondary | ICD-10-CM | POA: Diagnosis not present

## 2019-06-24 DIAGNOSIS — Z85828 Personal history of other malignant neoplasm of skin: Secondary | ICD-10-CM | POA: Diagnosis not present

## 2019-06-25 ENCOUNTER — Encounter: Payer: Self-pay | Admitting: Osteopathic Medicine

## 2019-06-25 DIAGNOSIS — M5416 Radiculopathy, lumbar region: Secondary | ICD-10-CM

## 2019-07-02 ENCOUNTER — Other Ambulatory Visit: Payer: Self-pay | Admitting: Osteopathic Medicine

## 2019-07-16 ENCOUNTER — Other Ambulatory Visit: Payer: Self-pay | Admitting: Osteopathic Medicine

## 2019-07-16 ENCOUNTER — Encounter: Payer: Self-pay | Admitting: Osteopathic Medicine

## 2019-07-16 DIAGNOSIS — G8929 Other chronic pain: Secondary | ICD-10-CM

## 2019-07-16 DIAGNOSIS — M5416 Radiculopathy, lumbar region: Secondary | ICD-10-CM

## 2019-07-16 DIAGNOSIS — I10 Essential (primary) hypertension: Secondary | ICD-10-CM

## 2019-07-16 MED ORDER — HYDROCODONE-ACETAMINOPHEN 5-325 MG PO TABS: 1.0000 | ORAL_TABLET | Freq: Every day | ORAL | 0 refills | Status: AC | PRN

## 2019-07-16 NOTE — Telephone Encounter (Signed)
Last written on 06/22/19. Rx pended.

## 2019-07-22 DIAGNOSIS — F5105 Insomnia due to other mental disorder: Secondary | ICD-10-CM | POA: Diagnosis not present

## 2019-07-22 DIAGNOSIS — I10 Essential (primary) hypertension: Secondary | ICD-10-CM | POA: Diagnosis not present

## 2019-07-22 DIAGNOSIS — F3170 Bipolar disorder, currently in remission, most recent episode unspecified: Secondary | ICD-10-CM | POA: Diagnosis not present

## 2019-07-22 DIAGNOSIS — G2581 Restless legs syndrome: Secondary | ICD-10-CM | POA: Diagnosis not present

## 2019-08-21 DIAGNOSIS — M5416 Radiculopathy, lumbar region: Secondary | ICD-10-CM | POA: Diagnosis not present

## 2019-08-21 DIAGNOSIS — M4317 Spondylolisthesis, lumbosacral region: Secondary | ICD-10-CM | POA: Diagnosis not present

## 2019-08-21 DIAGNOSIS — I700 Atherosclerosis of aorta: Secondary | ICD-10-CM | POA: Diagnosis not present

## 2019-08-21 DIAGNOSIS — M47817 Spondylosis without myelopathy or radiculopathy, lumbosacral region: Secondary | ICD-10-CM | POA: Diagnosis not present

## 2019-08-21 DIAGNOSIS — M47816 Spondylosis without myelopathy or radiculopathy, lumbar region: Secondary | ICD-10-CM | POA: Diagnosis not present

## 2019-08-21 DIAGNOSIS — M4316 Spondylolisthesis, lumbar region: Secondary | ICD-10-CM | POA: Diagnosis not present

## 2019-08-29 DIAGNOSIS — G8929 Other chronic pain: Secondary | ICD-10-CM | POA: Diagnosis not present

## 2019-08-29 DIAGNOSIS — M48061 Spinal stenosis, lumbar region without neurogenic claudication: Secondary | ICD-10-CM | POA: Diagnosis not present

## 2019-08-29 DIAGNOSIS — F319 Bipolar disorder, unspecified: Secondary | ICD-10-CM | POA: Diagnosis not present

## 2019-08-29 DIAGNOSIS — F419 Anxiety disorder, unspecified: Secondary | ICD-10-CM | POA: Diagnosis not present

## 2019-08-29 DIAGNOSIS — M5442 Lumbago with sciatica, left side: Secondary | ICD-10-CM | POA: Diagnosis not present

## 2019-08-29 DIAGNOSIS — M5116 Intervertebral disc disorders with radiculopathy, lumbar region: Secondary | ICD-10-CM | POA: Diagnosis not present

## 2019-08-29 DIAGNOSIS — M4726 Other spondylosis with radiculopathy, lumbar region: Secondary | ICD-10-CM | POA: Diagnosis not present

## 2019-09-10 DIAGNOSIS — Z79899 Other long term (current) drug therapy: Secondary | ICD-10-CM | POA: Diagnosis not present

## 2019-09-10 DIAGNOSIS — G2581 Restless legs syndrome: Secondary | ICD-10-CM | POA: Diagnosis not present

## 2019-09-10 DIAGNOSIS — M7989 Other specified soft tissue disorders: Secondary | ICD-10-CM | POA: Diagnosis not present

## 2019-09-10 DIAGNOSIS — R600 Localized edema: Secondary | ICD-10-CM | POA: Diagnosis not present

## 2019-09-10 DIAGNOSIS — E785 Hyperlipidemia, unspecified: Secondary | ICD-10-CM | POA: Diagnosis not present

## 2019-09-10 DIAGNOSIS — Z6841 Body Mass Index (BMI) 40.0 and over, adult: Secondary | ICD-10-CM | POA: Diagnosis not present

## 2019-09-10 DIAGNOSIS — Z888 Allergy status to other drugs, medicaments and biological substances status: Secondary | ICD-10-CM | POA: Diagnosis not present

## 2019-09-10 DIAGNOSIS — F319 Bipolar disorder, unspecified: Secondary | ICD-10-CM | POA: Diagnosis not present

## 2019-09-10 DIAGNOSIS — Z885 Allergy status to narcotic agent status: Secondary | ICD-10-CM | POA: Diagnosis not present

## 2019-09-10 DIAGNOSIS — I10 Essential (primary) hypertension: Secondary | ICD-10-CM | POA: Diagnosis not present

## 2019-09-30 DIAGNOSIS — R252 Cramp and spasm: Secondary | ICD-10-CM | POA: Diagnosis not present

## 2019-09-30 DIAGNOSIS — R200 Anesthesia of skin: Secondary | ICD-10-CM | POA: Diagnosis not present

## 2019-09-30 DIAGNOSIS — Z885 Allergy status to narcotic agent status: Secondary | ICD-10-CM | POA: Diagnosis not present

## 2019-09-30 DIAGNOSIS — I509 Heart failure, unspecified: Secondary | ICD-10-CM | POA: Diagnosis not present

## 2019-09-30 DIAGNOSIS — Z888 Allergy status to other drugs, medicaments and biological substances status: Secondary | ICD-10-CM | POA: Diagnosis not present

## 2019-09-30 DIAGNOSIS — I110 Hypertensive heart disease with heart failure: Secondary | ICD-10-CM | POA: Diagnosis not present

## 2019-09-30 DIAGNOSIS — M48061 Spinal stenosis, lumbar region without neurogenic claudication: Secondary | ICD-10-CM | POA: Diagnosis not present

## 2019-09-30 DIAGNOSIS — M545 Low back pain: Secondary | ICD-10-CM | POA: Diagnosis not present

## 2019-09-30 DIAGNOSIS — R600 Localized edema: Secondary | ICD-10-CM | POA: Diagnosis not present

## 2019-10-08 DIAGNOSIS — E785 Hyperlipidemia, unspecified: Secondary | ICD-10-CM | POA: Diagnosis not present

## 2019-10-08 DIAGNOSIS — R0609 Other forms of dyspnea: Secondary | ICD-10-CM | POA: Diagnosis not present

## 2019-10-08 DIAGNOSIS — Z79899 Other long term (current) drug therapy: Secondary | ICD-10-CM | POA: Diagnosis not present

## 2019-10-08 DIAGNOSIS — I10 Essential (primary) hypertension: Secondary | ICD-10-CM | POA: Diagnosis not present

## 2019-10-08 DIAGNOSIS — R600 Localized edema: Secondary | ICD-10-CM | POA: Diagnosis not present

## 2019-10-10 DIAGNOSIS — R7302 Impaired glucose tolerance (oral): Secondary | ICD-10-CM | POA: Insufficient documentation

## 2019-10-10 DIAGNOSIS — I10 Essential (primary) hypertension: Secondary | ICD-10-CM | POA: Diagnosis not present

## 2019-10-20 ENCOUNTER — Other Ambulatory Visit: Payer: Self-pay | Admitting: Psychiatry

## 2019-10-21 DIAGNOSIS — I10 Essential (primary) hypertension: Secondary | ICD-10-CM | POA: Diagnosis not present

## 2019-10-21 NOTE — Telephone Encounter (Signed)
Please review

## 2019-10-21 NOTE — Telephone Encounter (Signed)
Call to RS

## 2019-10-21 NOTE — Telephone Encounter (Signed)
Last visit 10/30/18

## 2019-10-28 NOTE — Telephone Encounter (Signed)
LM for patient to call for appointment for further refills.

## 2019-11-04 DIAGNOSIS — M25562 Pain in left knee: Secondary | ICD-10-CM | POA: Diagnosis not present

## 2019-11-04 DIAGNOSIS — M25561 Pain in right knee: Secondary | ICD-10-CM | POA: Diagnosis not present

## 2019-11-04 DIAGNOSIS — M170 Bilateral primary osteoarthritis of knee: Secondary | ICD-10-CM | POA: Diagnosis not present

## 2019-11-12 DIAGNOSIS — I679 Cerebrovascular disease, unspecified: Secondary | ICD-10-CM | POA: Diagnosis not present

## 2019-11-12 DIAGNOSIS — E785 Hyperlipidemia, unspecified: Secondary | ICD-10-CM | POA: Diagnosis not present

## 2019-11-12 DIAGNOSIS — Z79899 Other long term (current) drug therapy: Secondary | ICD-10-CM | POA: Diagnosis not present

## 2019-11-12 DIAGNOSIS — I10 Essential (primary) hypertension: Secondary | ICD-10-CM | POA: Diagnosis not present

## 2019-11-12 DIAGNOSIS — E871 Hypo-osmolality and hyponatremia: Secondary | ICD-10-CM | POA: Diagnosis not present

## 2019-11-12 DIAGNOSIS — R0600 Dyspnea, unspecified: Secondary | ICD-10-CM | POA: Diagnosis not present

## 2019-11-12 DIAGNOSIS — R600 Localized edema: Secondary | ICD-10-CM | POA: Diagnosis not present

## 2019-12-24 ENCOUNTER — Other Ambulatory Visit: Payer: Self-pay | Admitting: Psychiatry

## 2020-01-01 DIAGNOSIS — M170 Bilateral primary osteoarthritis of knee: Secondary | ICD-10-CM | POA: Diagnosis not present

## 2020-01-11 HISTORY — PX: BREAST IMPLANT REMOVAL: SUR1101

## 2020-01-13 ENCOUNTER — Telehealth: Payer: Medicare Other | Admitting: Psychiatry

## 2020-01-20 ENCOUNTER — Telehealth (INDEPENDENT_AMBULATORY_CARE_PROVIDER_SITE_OTHER): Payer: Medicare Other | Admitting: Psychiatry

## 2020-01-20 ENCOUNTER — Encounter: Payer: Self-pay | Admitting: Psychiatry

## 2020-01-20 DIAGNOSIS — F319 Bipolar disorder, unspecified: Secondary | ICD-10-CM | POA: Diagnosis not present

## 2020-01-20 DIAGNOSIS — F411 Generalized anxiety disorder: Secondary | ICD-10-CM | POA: Diagnosis not present

## 2020-01-20 DIAGNOSIS — F1311 Sedative, hypnotic or anxiolytic abuse, in remission: Secondary | ICD-10-CM | POA: Diagnosis not present

## 2020-01-20 MED ORDER — DIVALPROEX SODIUM ER 500 MG PO TB24: 1500.0000 mg | ORAL_TABLET | Freq: Every day | ORAL | 1 refills | Status: DC

## 2020-01-20 NOTE — Progress Notes (Signed)
Donna Sexton [id number] 07/17/1950 70 y.o.  Video Visit via My Chart  I connected with pt by My Chart and verified that I am speaking with the correct person using two identifiers.   I discussed the limitations, risks, security and privacy concerns of performing an evaluation and management service by My Chart  and the availability of in person appointments. I also discussed with the patient that there may be a patient responsible charge related to this service. The patient expressed understanding and agreed to proceed.  I discussed the assessment and treatment plan with the patient. The patient was provided an opportunity to ask questions and all were answered. The patient agreed with the plan and demonstrated an understanding of the instructions.   The patient was advised to call back or seek an in-person evaluation if the symptoms worsen or if the condition fails to improve as anticipated.  I provided 30 minutes of video time during this encounter.  The patient was located at home and the provider was located office. Session started at 230 and ended at 3.  Subjective:   Patient ID:  Donna Sexton is a 70 y.o. (DOB 1950/05/20) female.  Chief Complaint:  Chief Complaint  Patient presents with  . Follow-up    bipolar    HPI Donna Sexton presents to the office today for follow-up of bipolar disorder and anxiety and a history of occasional compliance problems.  Last seen October 2020.  No meds were changed.  She was maintained on Depakote 1500 HS.  01/20/2020 appt with following noted:  Changed to Dr.  Reinaldo Meeker at [facility].  Seen her 3 times and hard to get into see her and may have to change again. Trying to lose weight and lost 17#. Sometimes nearly drops things or double types and wonders if she could have TD. Vaccinated and boostered  Function is ok.  Sleep pretty good unless stressed.  Patient reports stable mood and denies depressed or irritable moods except as  indicated.   Patient denies difficulty with sleep initiation or maintenance. Denies appetite disturbance.  Patient reports that energy and motivation have been good.  Patient denies any difficulty with concentration.  Patient denies any suicidal ideation. No paranoia.  Past psychiatric medications:  Geodon, CBZ, risperidone, Zyprexa, Abilify,  perphenazine, loxapine,  lithium 900,  Topamax, gabapentin,lamotrigine, Depakote 1500 sertraline, duloxetine,  citalopram, venlafaxine, Wellbutrin, , buspirone GI SE, hydroxyzine, Prosom, lorazepam, temazepam, trazodone. History of BZ dependence remotely.  Review of Systems:  Review of Systems  Respiratory: Positive for shortness of breath.   Musculoskeletal: Positive for arthralgias.  Neurological: Negative for tremors and weakness.  Psychiatric/Behavioral: Negative for agitation, behavioral problems, confusion, decreased concentration, dysphoric mood, hallucinations, self-injury, sleep disturbance and suicidal ideas. The patient is not nervous/anxious and is not hyperactive.     Medications: I have reviewed the patient's current medications.  Current Outpatient Medications  Medication Sig Dispense Refill  . amLODipine (NORVASC) 10 MG tablet TAKE 1 TABLET EACH DAY. 90 tablet 0  . atorvastatin (LIPITOR) 40 MG tablet Take 1 tablet (40 mg total) by mouth daily. **PATIENT NEEDS OFFICE VISIT WITH PCP FOR ADDITIONAL REFILLS** 30 tablet 0  . cyclobenzaprine (FLEXERIL) 5 MG tablet Take 1 tablet (5 mg total) by mouth at bedtime. 90 tablet 3  . diphenoxylate-atropine (LOMOTIL) 2.5-0.025 MG tablet TAKE 1 TABLET FOUR TIMES DAILY AS NEEDED FOR DIARRHEA/LOOSE STOOLS. 30 tablet 0  . doxepin (SINEQUAN) 25 MG capsule Take 25 mg by mouth at bedtime.    Marland Kitchen  furosemide (LASIX) 40 MG tablet Take 40 mg by mouth daily.    Marland Kitchen. labetalol (NORMODYNE) 200 MG tablet TAKE 2 TABLETS IN THE MORNING AND 1 TABLET IN THE EVENING. (Patient taking differently: Take 200 mg by mouth once.  TAKE 2 TABLETS IN THE MORNING AND 1 TABLET IN THE EVENING.) 270 tablet 3  . oxybutynin (DITROPAN) 5 MG tablet TAKE 1/2 TO 1 TABLET TWICE DAILY. 180 tablet 1  . pregabalin (LYRICA) 150 MG capsule Take 150 mg by mouth 2 (two) times daily.    . promethazine (PHENERGAN) 25 MG tablet TAKE 1 TABLET EVERY 8 HOURS AS NEEDED FOR NAUSEA AND VOMITING. 90 tablet 1  . triamcinolone cream (KENALOG) 0.1 % Apply 1 application 2 (two) times daily topically. To affected area(s) as needed 15 g 1  . divalproex (DEPAKOTE ER) 500 MG 24 hr tablet Take 3 tablets (1,500 mg total) by mouth at bedtime. 270 tablet 1   No current facility-administered medications for this visit.    Medication Side Effects: None  Allergies:  Allergies  Allergen Reactions  . Other Other (See Comments)    Allergic to muscle relaxers -unknown reaction Flexeril only can take cyclobenzaprine.  . Morphine Nausea And Vomiting    Past Medical History:  Diagnosis Date  . ANEMIA 11/12/2007   Qualifier: Diagnosis of  By: Briscoe BurnsArchie CMA, Alvy BealLakisha    . Anxiety   . Arthritis   . ASTHMA NOS W/ACUTE EXACERBATION 01/13/2010   Qualifier: Diagnosis of  By: Yetta BarreJones MD, Bernadene Bellhomas L.   . BIPOLAR DISORDER UNSPECIFIED 10/29/2007   Qualifier: Diagnosis of  By: Yetta BarreJones MD, Bernadene Bellhomas L.   Marland Kitchen. COMMON MIGRAINE 12/29/2008   Qualifier: Diagnosis of  By: Yetta BarreJones MD, Bernadene Bellhomas L.   Marland Kitchen. GERD 07/28/2009   Qualifier: Diagnosis of  By: Felicity CoyerLeschber MD, Raenette RoverValerie A   . HYPERLIPIDEMIA 10/29/2007   Qualifier: Diagnosis of  By: Yetta BarreJones MD, Bernadene Bellhomas L.   . HYPERTENSION, BENIGN ESSENTIAL 10/29/2007   Qualifier: Diagnosis of  By: Yetta BarreJones MD, Bernadene Bellhomas L.   . OSTEOPENIA 10/30/2007   Qualifier: Diagnosis of  By: Yetta BarreJones MD, Bernadene Bellhomas L.   . OVERACTIVE BLADDER 02/21/2008   Qualifier: Diagnosis of  By: Jonny RuizJohn MD, Len BlalockJames W   . SLEEP APNEA 12/31/2009   Qualifier: Diagnosis of  By: Yetta BarreJones MD, Bernadene Bellhomas L.     Family History  Problem Relation Age of Onset  . Heart attack Mother   . Hypertension Mother   . Diabetes  Mother   . Heart attack Father   . Hypertension Father   . Alcohol abuse Maternal Uncle   . Breast cancer Neg Hx     Social History   Socioeconomic History  . Marital status: Divorced    Spouse name: Not on file  . Number of children: 2  . Years of education: 9  . Highest education level: Some college, no degree  Occupational History  . 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 Smoking status: Never Smoker  . Smokeless tobacco: Never Used  Vaping Use  . Vaping Use: Never used  Substance and Sexual Activity  . Alcohol use: No  . Drug use: No  . Sexual activity: Not Currently  Other Topics Concern  . Not on file  Social History Narrative   Take care of her dog.    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   Please see review of systems for further details on the patient's review from today.   Objective:   Physical Exam:  There were no vitals taken for this visit.  Physical Exam Neurological:     Mental Status: She is alert and oriented to person, place, and time.     Cranial Nerves: No dysarthria.  Psychiatric:        Attention and Perception: Attention normal.        Mood and Affect: Mood is not anxious or depressed. Affect is not tearful.        Behavior: Behavior is cooperative.        Thought Content: Thought content normal. Thought content is not paranoid or delusional. Thought content does not include homicidal or suicidal ideation. Thought content does not include homicidal or suicidal plan.        Cognition and Memory: Cognition and memory normal.     Comments:  Fair insight and judgment.    Lab Review:     Component Value Date/Time   NA 140 08/18/2017 0935   K 4.2 08/18/2017 0935   CL 105 08/18/2017 0935   CO2 29 08/18/2017 0935   GLUCOSE 101 (H) 08/18/2017 0935   BUN 13 08/18/2017 0935   CREATININE 0.65 08/18/2017 0935   CALCIUM 9.7 08/18/2017 0935   PROT 6.0 (L) 08/18/2017 0935   ALBUMIN 4.0 02/03/2016 0819   AST 10 08/18/2017 0935   ALT 9 08/18/2017 0935   ALKPHOS 52 02/03/2016 0819   BILITOT 1.0 08/18/2017 0935   GFRNONAA 91 08/18/2017 0935   GFRAA 106 08/18/2017 0935       Component Value Date/Time   WBC 4.3 02/28/2017 0910   RBC 3.82 02/28/2017 0910   HGB 12.2 02/28/2017 0910   HCT 35.4 02/28/2017 0910   PLT 183 02/28/2017 0910   MCV 92.7 02/28/2017 0910   MCV 94.2 06/06/2011 1113   MCH 31.9 02/28/2017 0910   MCHC 34.5 02/28/2017 0910   RDW 11.9 02/28/2017 0910   LYMPHSABS 1,470 02/03/2016 0819   MONOABS 637 02/03/2016 0819   EOSABS 245 02/03/2016 0819   BASOSABS 49 02/03/2016 0819    No results found for: POCLITH, LITHIUM   Lab Results  Component Value Date   VALPROATE 75.9 06/15/2015   CBMZ <0.3 (L) 02/01/2010     .res Assessment: Plan:    Bipolar I disorder (HCC) - Plan: divalproex (DEPAKOTE ER) 500 MG 24 hr tablet  Generalized anxiety disorder  Benzodiazepine abuse in remission [facility])   As noted patient has a long history of mood disorder and anxiety with multiple meds tried.  Depakote has been the most effective mood stabilizer and she has the best response at 1500 mg a day.  She is tolerating it well.  Her anxiety is manageable at this time and better than last time..  She has had compliance issues at times in the past but not in the last couple of years. Disc possible VPA related tremor and it's difference from TD in response to questions.  No AIM noted by video.  There is no evidence of benzodiazepine abuse or dependence at this time.  Been maintained.  Patient has committed to sobriety from  benzodiazepines.  This is encouraged.  Low dose doxepin unlikely to cause mood swings.  No evidence of liver problems from the Depakote.  We will not start new medications this visit.  Supportive therapy and boundary setting discussed in dealing with her drug addicted granddaughter.  This is been a chronic problem. Some new health problems with dx CHF.  Since mood has been stable we will follow-up in 6 months  Meredith Staggers, MD, DFAPA  Please see After Visit Summary for patient specific instructions.  No future appointments.  No orders of the defined types were placed in this encounter.     -------------------------------

## 2020-01-21 ENCOUNTER — Other Ambulatory Visit: Payer: Self-pay | Admitting: Osteopathic Medicine

## 2020-01-22 ENCOUNTER — Other Ambulatory Visit: Payer: Self-pay | Admitting: Osteopathic Medicine

## 2020-01-22 DIAGNOSIS — H2513 Age-related nuclear cataract, bilateral: Secondary | ICD-10-CM | POA: Diagnosis not present

## 2020-02-04 DIAGNOSIS — I10 Essential (primary) hypertension: Secondary | ICD-10-CM | POA: Diagnosis not present

## 2020-02-04 DIAGNOSIS — I119 Hypertensive heart disease without heart failure: Secondary | ICD-10-CM | POA: Diagnosis not present

## 2020-02-13 DIAGNOSIS — I10 Essential (primary) hypertension: Secondary | ICD-10-CM | POA: Diagnosis not present

## 2020-02-13 DIAGNOSIS — R079 Chest pain, unspecified: Secondary | ICD-10-CM | POA: Diagnosis not present

## 2020-03-30 ENCOUNTER — Other Ambulatory Visit: Payer: Self-pay | Admitting: Plastic Surgery

## 2020-03-30 DIAGNOSIS — Z1231 Encounter for screening mammogram for malignant neoplasm of breast: Secondary | ICD-10-CM

## 2020-04-28 DIAGNOSIS — Z9882 Breast implant status: Secondary | ICD-10-CM | POA: Diagnosis not present

## 2020-04-28 DIAGNOSIS — T8544XA Capsular contracture of breast implant, initial encounter: Secondary | ICD-10-CM | POA: Diagnosis not present

## 2020-04-28 DIAGNOSIS — I509 Heart failure, unspecified: Secondary | ICD-10-CM | POA: Diagnosis not present

## 2020-05-05 DIAGNOSIS — M170 Bilateral primary osteoarthritis of knee: Secondary | ICD-10-CM | POA: Diagnosis not present

## 2020-05-06 DIAGNOSIS — N644 Mastodynia: Secondary | ICD-10-CM | POA: Diagnosis not present

## 2020-05-06 DIAGNOSIS — R928 Other abnormal and inconclusive findings on diagnostic imaging of breast: Secondary | ICD-10-CM | POA: Diagnosis not present

## 2020-05-06 LAB — HM MAMMOGRAPHY

## 2020-05-11 DIAGNOSIS — D225 Melanocytic nevi of trunk: Secondary | ICD-10-CM | POA: Diagnosis not present

## 2020-05-11 DIAGNOSIS — L821 Other seborrheic keratosis: Secondary | ICD-10-CM | POA: Diagnosis not present

## 2020-05-11 DIAGNOSIS — L820 Inflamed seborrheic keratosis: Secondary | ICD-10-CM | POA: Diagnosis not present

## 2020-05-11 DIAGNOSIS — Z85828 Personal history of other malignant neoplasm of skin: Secondary | ICD-10-CM | POA: Diagnosis not present

## 2020-05-12 DIAGNOSIS — Z9189 Other specified personal risk factors, not elsewhere classified: Secondary | ICD-10-CM | POA: Diagnosis not present

## 2020-05-12 DIAGNOSIS — E785 Hyperlipidemia, unspecified: Secondary | ICD-10-CM | POA: Diagnosis not present

## 2020-05-12 DIAGNOSIS — I5030 Unspecified diastolic (congestive) heart failure: Secondary | ICD-10-CM | POA: Diagnosis not present

## 2020-05-12 DIAGNOSIS — R7303 Prediabetes: Secondary | ICD-10-CM | POA: Diagnosis not present

## 2020-05-12 DIAGNOSIS — F319 Bipolar disorder, unspecified: Secondary | ICD-10-CM | POA: Diagnosis not present

## 2020-05-12 DIAGNOSIS — I110 Hypertensive heart disease with heart failure: Secondary | ICD-10-CM | POA: Diagnosis not present

## 2020-05-12 DIAGNOSIS — I739 Peripheral vascular disease, unspecified: Secondary | ICD-10-CM | POA: Diagnosis not present

## 2020-05-12 DIAGNOSIS — Z79899 Other long term (current) drug therapy: Secondary | ICD-10-CM | POA: Diagnosis not present

## 2020-05-12 DIAGNOSIS — G2581 Restless legs syndrome: Secondary | ICD-10-CM | POA: Diagnosis not present

## 2020-05-12 DIAGNOSIS — Z8639 Personal history of other endocrine, nutritional and metabolic disease: Secondary | ICD-10-CM | POA: Diagnosis not present

## 2020-05-16 DIAGNOSIS — I5032 Chronic diastolic (congestive) heart failure: Secondary | ICD-10-CM | POA: Insufficient documentation

## 2020-05-29 DIAGNOSIS — F3170 Bipolar disorder, currently in remission, most recent episode unspecified: Secondary | ICD-10-CM | POA: Diagnosis not present

## 2020-05-29 DIAGNOSIS — R079 Chest pain, unspecified: Secondary | ICD-10-CM | POA: Diagnosis not present

## 2020-05-29 DIAGNOSIS — I5032 Chronic diastolic (congestive) heart failure: Secondary | ICD-10-CM | POA: Diagnosis not present

## 2020-06-01 DIAGNOSIS — H25813 Combined forms of age-related cataract, bilateral: Secondary | ICD-10-CM | POA: Diagnosis not present

## 2020-06-19 DIAGNOSIS — R252 Cramp and spasm: Secondary | ICD-10-CM | POA: Diagnosis not present

## 2020-06-19 DIAGNOSIS — R7302 Impaired glucose tolerance (oral): Secondary | ICD-10-CM | POA: Diagnosis not present

## 2020-06-19 DIAGNOSIS — K5903 Drug induced constipation: Secondary | ICD-10-CM | POA: Diagnosis not present

## 2020-06-19 DIAGNOSIS — E871 Hypo-osmolality and hyponatremia: Secondary | ICD-10-CM | POA: Diagnosis not present

## 2020-06-19 DIAGNOSIS — I5032 Chronic diastolic (congestive) heart failure: Secondary | ICD-10-CM | POA: Diagnosis not present

## 2020-06-26 DIAGNOSIS — R7302 Impaired glucose tolerance (oral): Secondary | ICD-10-CM | POA: Diagnosis not present

## 2020-06-26 DIAGNOSIS — I5032 Chronic diastolic (congestive) heart failure: Secondary | ICD-10-CM | POA: Diagnosis not present

## 2020-06-26 DIAGNOSIS — I119 Hypertensive heart disease without heart failure: Secondary | ICD-10-CM | POA: Diagnosis not present

## 2020-06-30 DIAGNOSIS — H25813 Combined forms of age-related cataract, bilateral: Secondary | ICD-10-CM | POA: Diagnosis not present

## 2020-06-30 DIAGNOSIS — H2513 Age-related nuclear cataract, bilateral: Secondary | ICD-10-CM | POA: Diagnosis not present

## 2020-07-07 DIAGNOSIS — I110 Hypertensive heart disease with heart failure: Secondary | ICD-10-CM | POA: Diagnosis not present

## 2020-07-07 DIAGNOSIS — K219 Gastro-esophageal reflux disease without esophagitis: Secondary | ICD-10-CM | POA: Diagnosis not present

## 2020-07-07 DIAGNOSIS — I5032 Chronic diastolic (congestive) heart failure: Secondary | ICD-10-CM | POA: Diagnosis not present

## 2020-07-07 DIAGNOSIS — H25812 Combined forms of age-related cataract, left eye: Secondary | ICD-10-CM | POA: Diagnosis not present

## 2020-07-30 DIAGNOSIS — T8544XA Capsular contracture of breast implant, initial encounter: Secondary | ICD-10-CM | POA: Diagnosis not present

## 2020-07-30 DIAGNOSIS — I509 Heart failure, unspecified: Secondary | ICD-10-CM | POA: Diagnosis not present

## 2020-07-30 DIAGNOSIS — I110 Hypertensive heart disease with heart failure: Secondary | ICD-10-CM | POA: Diagnosis not present

## 2020-07-30 DIAGNOSIS — Z885 Allergy status to narcotic agent status: Secondary | ICD-10-CM | POA: Diagnosis not present

## 2020-07-30 DIAGNOSIS — Z79899 Other long term (current) drug therapy: Secondary | ICD-10-CM | POA: Diagnosis not present

## 2020-07-30 DIAGNOSIS — E785 Hyperlipidemia, unspecified: Secondary | ICD-10-CM | POA: Diagnosis not present

## 2020-07-30 DIAGNOSIS — Z9189 Other specified personal risk factors, not elsewhere classified: Secondary | ICD-10-CM | POA: Diagnosis not present

## 2020-07-30 DIAGNOSIS — E7800 Pure hypercholesterolemia, unspecified: Secondary | ICD-10-CM | POA: Diagnosis not present

## 2020-07-30 DIAGNOSIS — T8544XS Capsular contracture of breast implant, sequela: Secondary | ICD-10-CM | POA: Diagnosis not present

## 2020-07-30 DIAGNOSIS — T8544XD Capsular contracture of breast implant, subsequent encounter: Secondary | ICD-10-CM | POA: Diagnosis not present

## 2020-07-30 DIAGNOSIS — Z9882 Breast implant status: Secondary | ICD-10-CM | POA: Diagnosis not present

## 2020-07-30 DIAGNOSIS — F419 Anxiety disorder, unspecified: Secondary | ICD-10-CM | POA: Diagnosis not present

## 2020-07-30 DIAGNOSIS — F32A Depression, unspecified: Secondary | ICD-10-CM | POA: Diagnosis not present

## 2020-08-11 DIAGNOSIS — M170 Bilateral primary osteoarthritis of knee: Secondary | ICD-10-CM | POA: Diagnosis not present

## 2020-08-20 DIAGNOSIS — H25811 Combined forms of age-related cataract, right eye: Secondary | ICD-10-CM | POA: Diagnosis not present

## 2020-08-26 ENCOUNTER — Other Ambulatory Visit: Payer: Self-pay | Admitting: Family Medicine

## 2020-08-26 ENCOUNTER — Ambulatory Visit
Admission: RE | Admit: 2020-08-26 | Discharge: 2020-08-26 | Disposition: A | Discharge status: Home / Self Care | Payer: Medicare Other | Source: Ambulatory Visit | Attending: Family Medicine | Admitting: Family Medicine

## 2020-08-26 ENCOUNTER — Other Ambulatory Visit: Payer: Self-pay

## 2020-08-26 DIAGNOSIS — R52 Pain, unspecified: Secondary | ICD-10-CM

## 2020-08-26 DIAGNOSIS — M533 Sacrococcygeal disorders, not elsewhere classified: Secondary | ICD-10-CM | POA: Diagnosis not present

## 2020-09-15 ENCOUNTER — Ambulatory Visit (INDEPENDENT_AMBULATORY_CARE_PROVIDER_SITE_OTHER): Payer: Medicare Other | Admitting: Family Medicine

## 2020-09-15 VITALS — BP 131/81 | HR 103 | Ht 64.0 in | Wt 227.7 lb

## 2020-09-15 DIAGNOSIS — R7302 Impaired glucose tolerance (oral): Secondary | ICD-10-CM | POA: Diagnosis not present

## 2020-09-15 DIAGNOSIS — Z23 Encounter for immunization: Secondary | ICD-10-CM | POA: Diagnosis not present

## 2020-09-15 DIAGNOSIS — I5032 Chronic diastolic (congestive) heart failure: Secondary | ICD-10-CM

## 2020-09-15 DIAGNOSIS — G2581 Restless legs syndrome: Secondary | ICD-10-CM

## 2020-09-15 DIAGNOSIS — E785 Hyperlipidemia, unspecified: Secondary | ICD-10-CM

## 2020-09-15 DIAGNOSIS — I10 Essential (primary) hypertension: Secondary | ICD-10-CM

## 2020-09-15 NOTE — Progress Notes (Signed)
New Patient Office Visit  Subjective:  Patient ID: Donna Sexton, female    DOB: 1950/11/12  Age: 70 y.o. MRN: [id number]  CC:  Chief Complaint  Patient presents with   Establish Care    HPI Donna Sexton presents to establish care.   She was diagnosed with congestive heart failure last year but has actually been doing really well.  She just had her breast implants removed this summer and also had right shoulder surgery in the spring.  She also had cataract surgery.  She has noted some bruising but says that we can discuss that at the next office visit.  She tries to stick with less than 2000 mg of sodium daily but admits its been somewhat of a struggle.  Interestingly she has a family history of heart failure in her mother and brother.  Family history of diabetes in her dad.  She has a history of prediabetes and was placed on Jardiance.  It looks like it was in part for glucose control as well as her heart failure.  She says unfortunately the cost went up to $135 a month and she could not afford that so she discontinued it about a month ago when she ran out.  Also unfortunately suffered from a fall this summer and injured her tailbone area.  She has been using a donut type cushion and has been getting better in that regard.  She does get her mammograms done regularly at [facility].  Is interested in getting a flu vaccine today.  He has a history of a complete hysterectomy.  Past Medical History:  Diagnosis Date   ANEMIA 11/12/2007   Qualifier: Diagnosis of  By: Briscoe Burns CMA, Alvy Beal     Anxiety    Arthritis    ASTHMA NOS W/ACUTE EXACERBATION 01/13/2010   Qualifier: Diagnosis of  By: Yetta Barre MD, Bernadene Bell.    BIPOLAR DISORDER UNSPECIFIED 10/29/2007   Qualifier: Diagnosis of  By: Yetta Barre MD, Bernadene Bell.    COMMON MIGRAINE 12/29/2008   Qualifier: Diagnosis of  By: Yetta Barre MD, Bernadene Bell.    GERD 07/28/2009   Qualifier: Diagnosis of  By: Felicity Coyer MD, Raenette Rover    HYPERLIPIDEMIA 10/29/2007    Qualifier: Diagnosis of  By: Yetta Barre MD, Bernadene Bell.    HYPERTENSION, BENIGN ESSENTIAL 10/29/2007   Qualifier: Diagnosis of  By: Yetta Barre MD, Bernadene Bell.    OSTEOPENIA 10/30/2007   Qualifier: Diagnosis of  By: Yetta Barre MD, Bernadene Bell.    OVERACTIVE BLADDER 02/21/2008   Qualifier: Diagnosis of  By: Jonny Ruiz MD, Len Blalock    SLEEP APNEA 12/31/2009   Qualifier: Diagnosis of  By: Yetta Barre MD, Bernadene Bell.     Past Surgical History:  Procedure Laterality Date   ABDOMINAL HYSTERECTOMY     BREAST IMPLANT REMOVAL Bilateral 2022   BREAST SURGERY  01/10/1978   implants bilateral   CARPAL TUNNEL RELEASE     CHOLECYSTECTOMY     HERNIA REPAIR     PAROTID ENDOSCOPY     TUBAL LIGATION      Family History  Problem Relation Age of Onset   Heart attack Mother    Hypertension Mother    Heart failure Mother    Heart attack Father    Hypertension Father    Diabetes Father    Heart failure Brother    Alcohol abuse Maternal Uncle    Breast cancer Neg Hx     Social History   Socioeconomic History   Marital status: Divorced  Spouse name: Not on file   Number of children: 2   Years of education: 9   Highest education level: Some college, no degree  Occupational History   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   HENT:  Negative for hearing loss, rhinorrhea and tinnitus.   Eyes:  Positive for visual disturbance.  Respiratory:  Negative for cough and wheezing.   Cardiovascular:  Negative for chest pain and palpitations.  Gastrointestinal:  Negative for blood in stool, diarrhea, nausea and vomiting.  Genitourinary:  Negative for difficulty urinating, vaginal bleeding and vaginal discharge.  Musculoskeletal:  Negative for arthralgias and myalgias.  Skin:  Negative for rash.  Neurological:  Negative for headaches.  Hematological:  Negative for adenopathy. Bruises/bleeds easily.  Psychiatric/Behavioral:  Negative for dysphoric mood and sleep disturbance. The patient is not nervous/anxious.    Objective:   Today's Vitals: BP 131/81   Pulse (!) 103   Ht 5\' 4"  (1.626 m)   Wt 227 lb 11.2 oz (103.3 kg)   SpO2 98%   BMI 39.08 kg/m   Physical Exam Constitutional:      Appearance: She is well-developed.  HENT:     Head: Normocephalic and atraumatic.     Right Ear: External ear normal.     Left Ear: External ear normal.  Eyes:     Conjunctiva/sclera: Conjunctivae normal.  Neck:     Thyroid: No thyromegaly.  Cardiovascular:     Rate and Rhythm: Normal rate and regular rhythm.     Heart sounds: Normal heart sounds.  Pulmonary:     Effort: Pulmonary effort is normal.     Breath sounds: Normal breath sounds. No wheezing.  Lymphadenopathy:     Cervical: No cervical adenopathy.  Skin:    General: Skin is warm and dry.  Neurological:     Mental Status: She is alert and oriented to person, place, and time.    Assessment & Plan:   Problem List Items Addressed This Visit       Cardiovascular and Mediastinum   Essential hypertension - Primary (Chronic)    Controlled.  Continue current regimen.  We will get updated blood work today.      Relevant Medications   labetalol (NORMODYNE) 200 MG tablet   Other Relevant Orders   CBC   COMPLETE METABOLIC PANEL WITH GFR   Lipid panel   TSH    Hemoglobin A1c   Chronic heart failure with preserved ejection fraction (HCC)    No sign of volume overload on exam.  Continue Lasix, beta-blocker.  He was also previously on Jardiance but because of cost has stopped it.      Relevant Medications  labetalol (NORMODYNE) 200 MG tablet   Other Relevant Orders   CBC   COMPLETE METABOLIC PANEL WITH GFR   Lipid panel   TSH   Hemoglobin A1c     Endocrine   Impaired glucose tolerance    We will get an updated A1c today.  She has been off of her Jardiance for about a month.      Relevant Orders   CBC   COMPLETE METABOLIC PANEL WITH GFR   Lipid panel   TSH   Hemoglobin A1c     Other   Restless leg (Chronic)    We will check CBC to evaluate for anemia.      Hyperlipidemia (Chronic)    Due to recheck lipids to make sure that her static is adequate      Relevant Medications   labetalol (NORMODYNE) 200 MG tablet   Other Visit Diagnoses     Need for immunization against influenza       Relevant Orders   Flu Vaccine QUAD High Dose(Fluad) (Completed)       Outpatient Encounter Medications as of 09/15/2020  Medication Sig   atorvastatin (LIPITOR) 40 MG tablet Take 1 tablet (40 mg total) by mouth daily. **PATIENT NEEDS OFFICE VISIT WITH PCP FOR ADDITIONAL REFILLS**   celecoxib (CELEBREX) 200 MG capsule Take by mouth.   Cholecalciferol 50 MCG (2000 UT) CAPS Take 50 Int'l Units by mouth.   diphenoxylate-atropine (LOMOTIL) 2.5-0.025 MG tablet TAKE 1 TABLET FOUR TIMES DAILY AS NEEDED FOR DIARRHEA/LOOSE STOOLS.   divalproex (DEPAKOTE ER) 500 MG 24 hr tablet Take 1,000 mg by mouth at bedtime.   doxepin (SINEQUAN) 25 MG capsule Take 25 mg by mouth at bedtime.   furosemide (LASIX) 40 MG tablet Take 40 mg by mouth daily.   labetalol (NORMODYNE) 200 MG tablet Take 200 mg by mouth 2 (two) times daily.   oxybutynin (DITROPAN) 5 MG tablet TAKE 1/2 TO 1 TABLET TWICE DAILY.   promethazine (PHENERGAN) 25 MG tablet TAKE 1 TABLET EVERY 8 HOURS AS  NEEDED FOR NAUSEA AND VOMITING.   [DISCONTINUED] divalproex (DEPAKOTE ER) 500 MG 24 hr tablet Take 3 tablets (1,500 mg total) by mouth at bedtime.   [DISCONTINUED] labetalol (NORMODYNE) 200 MG tablet TAKE 2 TABLETS IN THE MORNING AND 1 TABLET IN THE EVENING. (Patient taking differently: Take 200 mg by mouth once. TAKE 2 TABLETS IN THE MORNING AND 1 TABLET IN THE EVENING.)   [DISCONTINUED] triamcinolone cream (KENALOG) 0.1 % Apply 1 application 2 (two) times daily topically. To affected area(s) as needed   [DISCONTINUED] amLODipine (NORVASC) 10 MG tablet TAKE 1 TABLET EACH DAY.   [DISCONTINUED] cyclobenzaprine (FLEXERIL) 5 MG tablet Take 1 tablet (5 mg total) by mouth at bedtime.   [DISCONTINUED] pregabalin (LYRICA) 150 MG capsule Take 150 mg by mouth 2 (two) times daily.   No facility-administered encounter medications on file as of 09/15/2020.    Follow-up: Return in about 3 weeks (around 10/06/2020) for go over labs and discuss medications .   Nani Gasser, MD

## 2020-09-16 ENCOUNTER — Encounter: Payer: Self-pay | Admitting: Family Medicine

## 2020-09-16 ENCOUNTER — Other Ambulatory Visit: Payer: Self-pay | Admitting: Family Medicine

## 2020-09-16 DIAGNOSIS — I5032 Chronic diastolic (congestive) heart failure: Secondary | ICD-10-CM | POA: Diagnosis not present

## 2020-09-16 DIAGNOSIS — I10 Essential (primary) hypertension: Secondary | ICD-10-CM | POA: Diagnosis not present

## 2020-09-16 DIAGNOSIS — R7302 Impaired glucose tolerance (oral): Secondary | ICD-10-CM | POA: Diagnosis not present

## 2020-09-16 NOTE — Assessment & Plan Note (Signed)
We will check CBC to evaluate for anemia.

## 2020-09-16 NOTE — Assessment & Plan Note (Signed)
No sign of volume overload on exam.  Continue Lasix, beta-blocker.  He was also previously on Jardiance but because of cost has stopped it.

## 2020-09-16 NOTE — Assessment & Plan Note (Signed)
Controlled.  Continue current regimen.  We will get updated blood work today.

## 2020-09-16 NOTE — Assessment & Plan Note (Signed)
Due to recheck lipids to make sure that her static is adequate

## 2020-09-16 NOTE — Assessment & Plan Note (Signed)
We will get an updated A1c today.  She has been off of her Jardiance for about a month.

## 2020-09-17 ENCOUNTER — Telehealth: Payer: Self-pay | Admitting: Family Medicine

## 2020-09-17 ENCOUNTER — Encounter: Payer: Self-pay | Admitting: Family Medicine

## 2020-09-17 LAB — COMPLETE METABOLIC PANEL WITH GFR
AG Ratio: 2.1 (calc) (ref 1.0–2.5)
ALT: 11 U/L (ref 6–29)
AST: 13 U/L (ref 10–35)
Albumin: 3.9 g/dL (ref 3.6–5.1)
Alkaline phosphatase (APISO): 63 U/L (ref 37–153)
BUN: 21 mg/dL (ref 7–25)
CO2: 29 mmol/L (ref 20–32)
Calcium: 9 mg/dL (ref 8.6–10.4)
Chloride: 98 mmol/L (ref 98–110)
Creat: 0.73 mg/dL (ref 0.60–1.00)
Globulin: 1.9 g/dL (calc) (ref 1.9–3.7)
Glucose, Bld: 87 mg/dL (ref 65–99)
Potassium: 4.6 mmol/L (ref 3.5–5.3)
Sodium: 133 mmol/L — ABNORMAL LOW (ref 135–146)
Total Bilirubin: 0.7 mg/dL (ref 0.2–1.2)
Total Protein: 5.8 g/dL — ABNORMAL LOW (ref 6.1–8.1)
eGFR: 88 mL/min/{1.73_m2} (ref >=60)

## 2020-09-17 LAB — CBC
HCT: 36.9 % (ref 35.0–45.0)
Hemoglobin: 12.8 g/dL (ref 11.7–15.5)
MCH: 33.2 pg — ABNORMAL HIGH (ref 27.0–33.0)
MCHC: 34.7 g/dL (ref 32.0–36.0)
MCV: 95.6 fL (ref 80.0–100.0)
MPV: 9.8 fL (ref 7.5–12.5)
Platelets: 191 10*3/uL (ref 140–400)
RBC: 3.86 10*6/uL (ref 3.80–5.10)
RDW: 12 % (ref 11.0–15.0)
WBC: 5.6 10*3/uL (ref 3.8–10.8)

## 2020-09-17 LAB — HEMOGLOBIN A1C
Hgb A1c MFr Bld: 5.3 % of total Hgb (ref <5.7)
Mean Plasma Glucose: 105 mg/dL
eAG (mmol/L): 5.8 mmol/L

## 2020-09-17 LAB — LIPID PANEL
Cholesterol: 157 mg/dL (ref <200)
HDL: 66 mg/dL (ref >=50)
LDL Cholesterol (Calc): 77 mg/dL (calc)
Non-HDL Cholesterol (Calc): 91 mg/dL (calc) (ref <130)
Total CHOL/HDL Ratio: 2.4 (calc) (ref <5.0)
Triglycerides: 60 mg/dL (ref <150)

## 2020-09-17 LAB — TSH: TSH: 2.56 mIU/L (ref 0.40–4.50)

## 2020-09-17 NOTE — Progress Notes (Signed)
Hi Makinzee, your blood count looks okay overall no sign of anemia.  Your sodium was just a little bit low.  Its not in a worrisome range.  It can be a side effect of the Lasix.  So we will definitely keep an eye on that.  Your protein levels are also a little bit low just encourage you to make sure that you are getting protein in your diet with each meal during the day.  Liver function is normal.  Cholesterol all looks good.  Thyroid level looks great.  A1c looks phenomenal at 5.3.  We can go over in further detail when I see you again.

## 2020-09-17 NOTE — Telephone Encounter (Signed)
Patient dropped off forms that she said she filled out for the PCP, that [place] and Dr Linford Arnold are aware she was bringing these by. She stated she filled out the paperwork and there was nothing for the provider to fill out but I still attached a billing form and placed in provider box. AM *09/17/20

## 2020-09-18 NOTE — Telephone Encounter (Signed)
Forms given to front office to be scanned into pt's chart.

## 2020-09-24 ENCOUNTER — Encounter: Payer: Self-pay | Admitting: Family Medicine

## 2020-09-30 ENCOUNTER — Other Ambulatory Visit: Payer: Self-pay | Admitting: Family Medicine

## 2020-09-30 ENCOUNTER — Encounter: Payer: Self-pay | Admitting: Family Medicine

## 2020-10-06 ENCOUNTER — Ambulatory Visit (INDEPENDENT_AMBULATORY_CARE_PROVIDER_SITE_OTHER): Payer: Medicare Other | Admitting: Family Medicine

## 2020-10-06 ENCOUNTER — Encounter: Payer: Self-pay | Admitting: Family Medicine

## 2020-10-06 VITALS — BP 116/66 | HR 96 | Ht 64.0 in | Wt 228.0 lb

## 2020-10-06 DIAGNOSIS — M79605 Pain in left leg: Secondary | ICD-10-CM | POA: Insufficient documentation

## 2020-10-06 DIAGNOSIS — G8929 Other chronic pain: Secondary | ICD-10-CM | POA: Diagnosis not present

## 2020-10-06 DIAGNOSIS — M545 Low back pain, unspecified: Secondary | ICD-10-CM

## 2020-10-06 DIAGNOSIS — M79604 Pain in right leg: Secondary | ICD-10-CM | POA: Diagnosis not present

## 2020-10-06 MED ORDER — TIZANIDINE HCL 4 MG PO TABS: 4.0000 mg | ORAL_TABLET | Freq: Every evening | ORAL | 1 refills | Status: DC | PRN

## 2020-10-06 NOTE — Assessment & Plan Note (Signed)
Chronic low back pain last MRI was in 2016 and that we could find on file facet injections received in 2017.  Recommend updated MRI to see if this could be causing some of the more persistent pain in her legs it is keeping her from being as active.

## 2020-10-06 NOTE — Addendum Note (Signed)
Addended by: Nani Gasser D on: 10/06/2020 08:45 PM   Modules accepted: Orders

## 2020-10-06 NOTE — Progress Notes (Addendum)
Established Patient Office Visit  Subjective:  Patient ID: Donna Sexton, female    DOB: 1950-08-23  Age: 70 y.o. MRN: [id number]  CC:  Chief Complaint  Patient presents with   Follow-up    HPI Donna Sexton presents for f/u of recent labs.    Labs: blood count looks okay overall no sign of anemia.  Your sodium was just a little bit low.  Its not in a worrisome range.  It can be a side effect of the Lasix.  So we will definitely keep an eye on that.  Your protein levels are also a little bit low just encourage you to make sure that you are getting protein in your diet with each meal during the day.  Liver function is normal.  Cholesterol all looks good.  Thyroid level looks great.  A1c looks phenomenal at 5.3.  She also has some concerns about bilateral leg pain she does have a history of restless leg syndrome.  She says the pain is mostly from her upper thighs all the way down to her feet bilaterally its down the entire leg not just anteriorly or posteriorly.  It feels better at rest and actually with walking it is worse when she is just standing for prolonged period or if she walks a lot it starts to help.  She has not noticed any discoloration in her feet.  She does have chronic back problems.  She has been using an over-the-counter pain reliever, Aspercreme and a heating pad.  Labs did not reveal anemia.  The Flexeril muscle relaxer was helpful.  Does have arthritis in both knees as well and is seeing orthopedist for this in fact she saw them last spring injections did help for a very short period of time they had recommended physical therapy but at the time she was having so much pain as she was worried that it would make.  He is also really interested in trying to lose weight she knows that if she loses weight it could help with some of her joint pain as well as some of her heart issues.  Past Medical History:  Diagnosis Date   ANEMIA 11/12/2007   Qualifier: Diagnosis of  By:  Tiney Rouge CMA, Ellison Hughs     Anxiety    Arthritis    ASTHMA NOS W/ACUTE EXACERBATION 01/13/2010   Qualifier: Diagnosis of  By: Ronnald Ramp MD, Arvid Right.    BIPOLAR DISORDER UNSPECIFIED 10/29/2007   Qualifier: Diagnosis of  By: Ronnald Ramp MD, [place] MIGRAINE 12/29/2008   Qualifier: Diagnosis of  By: Ronnald Ramp MD, Arvid Right.    GERD 07/28/2009   Qualifier: Diagnosis of  By: Asa Lente MD, Jannifer Rodney    HYPERLIPIDEMIA 10/29/2007   Qualifier: Diagnosis of  By: Ronnald Ramp MD, Arvid Right.    HYPERTENSION, BENIGN ESSENTIAL 10/29/2007   Qualifier: Diagnosis of  By: Ronnald Ramp MD, Arvid Right.    OSTEOPENIA 10/30/2007   Qualifier: Diagnosis of  By: Ronnald Ramp MD, Evan BLADDER 02/21/2008   Qualifier: Diagnosis of  By: Jenny Reichmann MD, [place] 12/31/2009   Qualifier: Diagnosis of  By: Ronnald Ramp MD, Arvid Right.     Past Surgical History:  Procedure Laterality Date   ABDOMINAL HYSTERECTOMY     BREAST IMPLANT REMOVAL Bilateral 2022   BREAST SURGERY  01/10/1978   implants bilateral   CARPAL TUNNEL RELEASE     CHOLECYSTECTOMY     HERNIA REPAIR  PAROTID ENDOSCOPY     TUBAL LIGATION      Family History  Problem Relation Age of Onset   Heart attack Mother    Hypertension Mother    Heart failure Mother    Heart attack Father    Hypertension Father    Diabetes Father    Heart failure Brother    Alcohol abuse Maternal Uncle    Breast cancer Neg Hx     Social History   Socioeconomic History   Marital status: Divorced    Spouse name: Not on file   Number of children: 2   Years of education: 9   Highest education level: Some college, no degree  Occupational History   Occupation: Programmer, applications    Comment: retired  Tobacco Use   Smoking status: Never   Smokeless tobacco: Never  Scientific laboratory technician Use: Never used  Substance and Sexual Activity   Alcohol use: No   Drug use: No   Sexual activity: Not Currently  Other Topics Concern   Not on file  Social History Narrative   Take care of  her dog.    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 **PATIENT NEEDS OFFICE VISIT WITH PCP FOR ADDITIONAL REFILLS** 30 tablet 0   celecoxib (CELEBREX) 200 MG capsule Take by mouth.     Cholecalciferol 50 MCG (2000 UT) CAPS Take 50 Int'l Units by mouth.     diphenoxylate-atropine (LOMOTIL) 2.5-0.025 MG tablet TAKE 1 TABLET FOUR TIMES DAILY AS NEEDED FOR DIARRHEA/LOOSE STOOLS. 30 tablet 0   divalproex (DEPAKOTE ER) 500 MG 24 hr tablet Take 1,000 mg by mouth at bedtime.     doxepin (SINEQUAN) 25 MG capsule Take 25 mg by mouth at bedtime.     furosemide (LASIX) 40 MG tablet Take 40 mg by mouth daily.     labetalol (NORMODYNE) 200 MG tablet Take 200 mg by mouth 2 (two) times daily.     oxybutynin (DITROPAN) 5 MG tablet TAKE 1/2 TO 1 TABLET TWICE DAILY. 180 tablet 1   promethazine (PHENERGAN) 25 MG tablet TAKE 1 TABLET EVERY 8 HOURS AS NEEDED FOR NAUSEA AND VOMITING. 90 tablet 1   cyclobenzaprine (FLEXERIL) 5 MG tablet TAKE 1 OR 2 TABLETS BY MOUTH THREE TIMES DAILY AS NEEDED UP TO 14 days FOR MUSCLE SPASMS 30 tablet 1   No facility-administered medications prior to visit.    Allergies  Allergen Reactions   Morphine Nausea And Vomiting    Other reaction(s): Other (See Comments) Unspecified reaction    Other Other (See Comments)    Allergic to muscle relaxers -unknown reaction Flexeril only can take cyclobenzaprine.    ROS Review of Systems    Objective:    Physical Exam  BP 116/66   Pulse 96   Ht _0  (1.626 m)   Wt 228 lb (103.4 kg)   SpO2 99%   BMI 39.14 kg/m   Wt Readings from Last 3 Encounters:  10/06/20 228 lb (103.4 kg)  09/15/20 227 lb 11.2 oz (103.3 kg)  05/16/19 220 lb 6.4 oz (100 kg)     Health Maintenance Due  Topic Date Due   COVID-19 Vaccine (4 - Booster for Pfizer series) 12/30/2019   COLONOSCOPY (Pts 45-34yr Insurance coverage will need to be confirmed)  06/30/2020    There are no preventive care reminders to display for this patient.  Lab Results  Component Value Date   TSH 2.56 09/16/2020   Lab Results  Component Value Date   WBC 5.6 09/16/2020   HGB 12.8 09/16/2020   HCT 36.9 09/16/2020   MCV 95.6 09/16/2020   PLT 191 09/16/2020   Lab Results  Component Value Date   NA 133 (L) 09/16/2020   K 4.6 09/16/2020   CO2 29 09/16/2020   GLUCOSE 87 09/16/2020   BUN 21 09/16/2020   CREATININE 0.73 09/16/2020   BILITOT 0.7 09/16/2020   ALKPHOS 52 02/03/2016   AST 13 09/16/2020   ALT 11 09/16/2020   PROT 5.8 (L) 09/16/2020   ALBUMIN 4.0 02/03/2016   CALCIUM 9.0 09/16/2020   EGFR 88 09/16/2020   Lab Results  Component Value Date   CHOL 157 09/16/2020   Lab Results  Component Value Date   HDL 66 09/16/2020   Lab Results  Component Value Date   LDLCALC 77 09/16/2020   Lab Results  Component Value Date   TRIG 60 09/16/2020   Lab Results  Component Value Date   CHOLHDL 2.4 09/16/2020   Lab Results  Component Value Date   HGBA1C 5.3 09/16/2020      Assessment & Plan:   Problem List Items Addressed This Visit       Other   Lumbago (Chronic)    Chronic low back pain last MRI was in 2016 and that we could find on file facet injections received in 2017.  Recommend updated MRI to see if this could be causing some of the more persistent pain in her legs it is keeping her from being as active.      Relevant Medications   tiZANidine (ZANAFLEX) 4 MG tablet   Other Relevant Orders   MR Lumbar Spine Wo Contrast   Pain in both lower extremities - Primary    Unclear etiology at this point but I am most  suspicious of this coming from her chronic back issues.  She has had ongoing issues for years and actually saw Dr. NSherwood Gamblerback in 2017 for facet injections which were helpful at the time.  She has not had a more recent MRI that we can at least find on file.  She is not having skin because discoloration to suspect peripheral vascular disease and she actually gets more discomfort with prolonged standing versus walking which would make PVD also less likely.  Also consider progressing neuropathy she has had numbness in her toes for years which is not new it is constant.  Will d/c flexeril for more preferable muscle relaxer based on BEERS list.  Will try tizanidine.  New rx sent.       Relevant Medications   tiZANidine (ZANAFLEX) 4 MG tablet   Other Relevant Orders   MR Lumbar Spine Wo Contrast    He also went over her recent labs together and everything actually looks pretty reassuring.  Schedule follow-up after MRI.  Meds ordered this encounter  Medications   tiZANidine (ZANAFLEX) 4 MG tablet    Sig: Take 1 tablet (4 mg total) by mouth at bedtime as needed for muscle spasms.    Dispense:  30 tablet  Refill:  1     Follow-up: No follow-ups on file.    Beatrice Lecher, MD

## 2020-10-06 NOTE — Assessment & Plan Note (Addendum)
Unclear etiology at this point but I am most suspicious of this coming from her chronic back issues.  She has had ongoing issues for years and actually saw Dr. Newell Coral back in 2017 for facet injections which were helpful at the time.  She has not had a more recent MRI that we can at least find on file.  She is not having skin because discoloration to suspect peripheral vascular disease and she actually gets more discomfort with prolonged standing versus walking which would make PVD also less likely.  Also consider progressing neuropathy she has had numbness in her toes for years which is not new it is constant.  Will d/c flexeril for more preferable muscle relaxer based on BEERS list.  Will try tizanidine.  New rx sent.

## 2020-10-06 NOTE — Telephone Encounter (Signed)
Sorry for the delay in sending.  Tell her to check this AM with her pharmacy. Sorry about that

## 2020-10-06 NOTE — Patient Instructions (Signed)
Look check out the apps for calorie counting.  These both have a free version.    My Fitness Pal Lose It.

## 2020-10-12 ENCOUNTER — Encounter: Payer: Self-pay | Admitting: Family Medicine

## 2020-10-14 NOTE — Telephone Encounter (Signed)
There are currently 2 notes that are open 1 regarding treatment options for the pain.  But in response to this note I would like to go ahead and refer her to neurology so that they can possibly do a nerve conduction test to see if the pain is coming from her back or if she is developing peripheral neuropathy.  Again just be aware that there are 2 notes open.

## 2020-10-14 NOTE — Telephone Encounter (Signed)
Could consider trial of gabapentin or Lyrica for leg pain.  See if she is tried either 1 before

## 2020-10-17 ENCOUNTER — Other Ambulatory Visit: Payer: Self-pay

## 2020-10-17 ENCOUNTER — Ambulatory Visit (INDEPENDENT_AMBULATORY_CARE_PROVIDER_SITE_OTHER): Payer: Medicare Other

## 2020-10-17 DIAGNOSIS — M5126 Other intervertebral disc displacement, lumbar region: Secondary | ICD-10-CM | POA: Diagnosis not present

## 2020-10-17 DIAGNOSIS — G8929 Other chronic pain: Secondary | ICD-10-CM

## 2020-10-17 DIAGNOSIS — R531 Weakness: Secondary | ICD-10-CM | POA: Diagnosis not present

## 2020-10-17 DIAGNOSIS — M545 Low back pain, unspecified: Secondary | ICD-10-CM

## 2020-10-17 DIAGNOSIS — M79604 Pain in right leg: Secondary | ICD-10-CM | POA: Diagnosis not present

## 2020-10-17 DIAGNOSIS — M79605 Pain in left leg: Secondary | ICD-10-CM | POA: Diagnosis not present

## 2020-10-17 DIAGNOSIS — M48061 Spinal stenosis, lumbar region without neurogenic claudication: Secondary | ICD-10-CM | POA: Diagnosis not present

## 2020-10-17 DIAGNOSIS — E882 Lipomatosis, not elsewhere classified: Secondary | ICD-10-CM | POA: Diagnosis not present

## 2020-10-19 ENCOUNTER — Encounter: Payer: Self-pay | Admitting: Family Medicine

## 2020-10-19 ENCOUNTER — Other Ambulatory Visit: Payer: Self-pay | Admitting: Family Medicine

## 2020-10-19 MED ORDER — PREGABALIN 50 MG PO CAPS: ORAL_CAPSULE | ORAL | 0 refills | Status: DC

## 2020-10-19 NOTE — Telephone Encounter (Signed)
Meds ordered this encounter  Medications   pregabalin (LYRICA) 50 MG capsule    Sig: Take 1 capsule (50 mg total) by mouth 3 (three) times daily for 10 days, THEN 1-2 capsules (50-100 mg total) 3 (three) times daily for 10 days. Go slow to increase as can cause sedation.    Dispense:  120 capsule    Refill:  0

## 2020-10-20 NOTE — Progress Notes (Signed)
Donna Sexton, you have an awful lot of arthritis in your back.  The good news is that they did not see any type of tumor or mass which is reassuring.  You have bulging discs at every level in your low back.  But the one that is most concerning is at L2-3 on the left side it looks like it is touching and pushing on your L2 nerve root.  It does look like it is worse than it did compared to prior MRI.  You also have the disc on the left side at L4-5 which is also pressing on the L4 nerve root.  You also have a lot of arthritis in the hinges of the vertebrae.  Again a little bit worse on the left compared to the right.  Also have was called spinal stenosis which is narrowing of the canal.  They did not feel like it looks significantly different from your MRI back in 2019 which is reassuring.  I really feel like the neck step would be to get you in with an orthopedist who specializes in spine or maybe even a neurosurgeon to have them take a look at this to see what they would recommend because I am very concerned about the amount of pain that you are having in your legs.  If you are okay with a referral please let me know and we will get that ASAP

## 2020-10-21 ENCOUNTER — Other Ambulatory Visit: Payer: Self-pay | Admitting: *Deleted

## 2020-10-21 DIAGNOSIS — M545 Low back pain, unspecified: Secondary | ICD-10-CM

## 2020-10-21 DIAGNOSIS — M79604 Pain in right leg: Secondary | ICD-10-CM

## 2020-10-27 ENCOUNTER — Other Ambulatory Visit: Payer: Self-pay | Admitting: Family Medicine

## 2020-10-28 DIAGNOSIS — Z6839 Body mass index (BMI) 39.0-39.9, adult: Secondary | ICD-10-CM | POA: Diagnosis not present

## 2020-10-28 DIAGNOSIS — M48062 Spinal stenosis, lumbar region with neurogenic claudication: Secondary | ICD-10-CM | POA: Diagnosis not present

## 2020-10-28 DIAGNOSIS — R030 Elevated blood-pressure reading, without diagnosis of hypertension: Secondary | ICD-10-CM | POA: Diagnosis not present

## 2020-11-02 ENCOUNTER — Other Ambulatory Visit: Payer: Self-pay | Admitting: Family Medicine

## 2020-11-17 DIAGNOSIS — Z7984 Long term (current) use of oral hypoglycemic drugs: Secondary | ICD-10-CM | POA: Diagnosis not present

## 2020-11-17 DIAGNOSIS — R7303 Prediabetes: Secondary | ICD-10-CM | POA: Diagnosis not present

## 2020-11-17 DIAGNOSIS — R296 Repeated falls: Secondary | ICD-10-CM | POA: Diagnosis not present

## 2020-11-17 DIAGNOSIS — Z8639 Personal history of other endocrine, nutritional and metabolic disease: Secondary | ICD-10-CM | POA: Diagnosis not present

## 2020-11-17 DIAGNOSIS — I110 Hypertensive heart disease with heart failure: Secondary | ICD-10-CM | POA: Diagnosis not present

## 2020-11-17 DIAGNOSIS — Z79899 Other long term (current) drug therapy: Secondary | ICD-10-CM | POA: Diagnosis not present

## 2020-11-17 DIAGNOSIS — E785 Hyperlipidemia, unspecified: Secondary | ICD-10-CM | POA: Diagnosis not present

## 2020-11-17 DIAGNOSIS — I10 Essential (primary) hypertension: Secondary | ICD-10-CM | POA: Diagnosis not present

## 2020-11-17 DIAGNOSIS — W19XXXS Unspecified fall, sequela: Secondary | ICD-10-CM | POA: Diagnosis not present

## 2020-11-17 DIAGNOSIS — R0602 Shortness of breath: Secondary | ICD-10-CM | POA: Diagnosis not present

## 2020-11-17 DIAGNOSIS — M7989 Other specified soft tissue disorders: Secondary | ICD-10-CM | POA: Diagnosis not present

## 2020-11-17 DIAGNOSIS — I5030 Unspecified diastolic (congestive) heart failure: Secondary | ICD-10-CM | POA: Diagnosis not present

## 2020-11-18 DIAGNOSIS — M4317 Spondylolisthesis, lumbosacral region: Secondary | ICD-10-CM | POA: Diagnosis not present

## 2020-11-18 DIAGNOSIS — M48062 Spinal stenosis, lumbar region with neurogenic claudication: Secondary | ICD-10-CM | POA: Diagnosis not present

## 2020-11-18 DIAGNOSIS — I10 Essential (primary) hypertension: Secondary | ICD-10-CM | POA: Diagnosis not present

## 2020-11-18 DIAGNOSIS — Z9049 Acquired absence of other specified parts of digestive tract: Secondary | ICD-10-CM | POA: Diagnosis not present

## 2020-11-18 DIAGNOSIS — R0602 Shortness of breath: Secondary | ICD-10-CM | POA: Diagnosis not present

## 2020-11-18 DIAGNOSIS — M5137 Other intervertebral disc degeneration, lumbosacral region: Secondary | ICD-10-CM | POA: Diagnosis not present

## 2020-11-18 DIAGNOSIS — M47817 Spondylosis without myelopathy or radiculopathy, lumbosacral region: Secondary | ICD-10-CM | POA: Diagnosis not present

## 2020-11-18 DIAGNOSIS — M4186 Other forms of scoliosis, lumbar region: Secondary | ICD-10-CM | POA: Diagnosis not present

## 2020-11-18 DIAGNOSIS — M4316 Spondylolisthesis, lumbar region: Secondary | ICD-10-CM | POA: Diagnosis not present

## 2020-11-18 DIAGNOSIS — Z885 Allergy status to narcotic agent status: Secondary | ICD-10-CM | POA: Diagnosis not present

## 2020-11-30 DIAGNOSIS — Z888 Allergy status to other drugs, medicaments and biological substances status: Secondary | ICD-10-CM | POA: Diagnosis not present

## 2020-11-30 DIAGNOSIS — M48062 Spinal stenosis, lumbar region with neurogenic claudication: Secondary | ICD-10-CM | POA: Diagnosis not present

## 2020-11-30 DIAGNOSIS — G9519 Other vascular myelopathies: Secondary | ICD-10-CM | POA: Diagnosis not present

## 2020-11-30 DIAGNOSIS — Z885 Allergy status to narcotic agent status: Secondary | ICD-10-CM | POA: Diagnosis not present

## 2020-12-07 ENCOUNTER — Other Ambulatory Visit: Payer: Self-pay | Admitting: Psychiatry

## 2020-12-07 DIAGNOSIS — M48062 Spinal stenosis, lumbar region with neurogenic claudication: Secondary | ICD-10-CM | POA: Diagnosis not present

## 2020-12-07 DIAGNOSIS — Z01812 Encounter for preprocedural laboratory examination: Secondary | ICD-10-CM | POA: Diagnosis not present

## 2020-12-07 DIAGNOSIS — E871 Hypo-osmolality and hyponatremia: Secondary | ICD-10-CM | POA: Diagnosis not present

## 2020-12-09 ENCOUNTER — Encounter: Payer: Self-pay | Admitting: Family Medicine

## 2020-12-09 DIAGNOSIS — E871 Hypo-osmolality and hyponatremia: Secondary | ICD-10-CM

## 2020-12-10 ENCOUNTER — Other Ambulatory Visit: Payer: Self-pay | Admitting: Family Medicine

## 2020-12-10 DIAGNOSIS — M79604 Pain in right leg: Secondary | ICD-10-CM

## 2020-12-14 ENCOUNTER — Other Ambulatory Visit: Payer: Self-pay | Admitting: Family Medicine

## 2020-12-15 DIAGNOSIS — E871 Hypo-osmolality and hyponatremia: Secondary | ICD-10-CM | POA: Diagnosis not present

## 2020-12-15 DIAGNOSIS — R0602 Shortness of breath: Secondary | ICD-10-CM | POA: Diagnosis not present

## 2020-12-15 DIAGNOSIS — I10 Essential (primary) hypertension: Secondary | ICD-10-CM | POA: Diagnosis not present

## 2020-12-16 DIAGNOSIS — E871 Hypo-osmolality and hyponatremia: Secondary | ICD-10-CM | POA: Diagnosis not present

## 2020-12-17 DIAGNOSIS — I110 Hypertensive heart disease with heart failure: Secondary | ICD-10-CM | POA: Diagnosis not present

## 2020-12-17 DIAGNOSIS — G4700 Insomnia, unspecified: Secondary | ICD-10-CM | POA: Diagnosis not present

## 2020-12-17 DIAGNOSIS — E785 Hyperlipidemia, unspecified: Secondary | ICD-10-CM | POA: Diagnosis not present

## 2020-12-17 DIAGNOSIS — Z79899 Other long term (current) drug therapy: Secondary | ICD-10-CM | POA: Diagnosis not present

## 2020-12-17 DIAGNOSIS — F319 Bipolar disorder, unspecified: Secondary | ICD-10-CM | POA: Diagnosis not present

## 2020-12-17 DIAGNOSIS — I5032 Chronic diastolic (congestive) heart failure: Secondary | ICD-10-CM | POA: Diagnosis not present

## 2020-12-17 DIAGNOSIS — M48062 Spinal stenosis, lumbar region with neurogenic claudication: Secondary | ICD-10-CM | POA: Diagnosis not present

## 2020-12-17 DIAGNOSIS — E871 Hypo-osmolality and hyponatremia: Secondary | ICD-10-CM | POA: Diagnosis not present

## 2020-12-17 DIAGNOSIS — M5416 Radiculopathy, lumbar region: Secondary | ICD-10-CM | POA: Diagnosis not present

## 2020-12-17 LAB — OSMOLALITY: Osmolality: 269 mOsm/kg — ABNORMAL LOW (ref 278–305)

## 2020-12-17 LAB — SODIUM: Sodium: 130 mmol/L — ABNORMAL LOW (ref 135–146)

## 2020-12-18 DIAGNOSIS — I110 Hypertensive heart disease with heart failure: Secondary | ICD-10-CM | POA: Diagnosis not present

## 2020-12-18 DIAGNOSIS — M48062 Spinal stenosis, lumbar region with neurogenic claudication: Secondary | ICD-10-CM | POA: Diagnosis not present

## 2020-12-18 DIAGNOSIS — E785 Hyperlipidemia, unspecified: Secondary | ICD-10-CM | POA: Diagnosis not present

## 2020-12-18 DIAGNOSIS — G4700 Insomnia, unspecified: Secondary | ICD-10-CM | POA: Diagnosis not present

## 2020-12-18 DIAGNOSIS — E871 Hypo-osmolality and hyponatremia: Secondary | ICD-10-CM | POA: Diagnosis not present

## 2020-12-18 DIAGNOSIS — F319 Bipolar disorder, unspecified: Secondary | ICD-10-CM | POA: Diagnosis not present

## 2020-12-18 DIAGNOSIS — Z79899 Other long term (current) drug therapy: Secondary | ICD-10-CM | POA: Diagnosis not present

## 2020-12-18 DIAGNOSIS — I5032 Chronic diastolic (congestive) heart failure: Secondary | ICD-10-CM | POA: Diagnosis not present

## 2020-12-18 DIAGNOSIS — M5416 Radiculopathy, lumbar region: Secondary | ICD-10-CM | POA: Diagnosis not present

## 2020-12-19 LAB — SODIUM, URINE, RANDOM: Sodium, Ur: 47 mmol/L (ref 28–272)

## 2020-12-22 ENCOUNTER — Telehealth: Payer: Self-pay | Admitting: *Deleted

## 2020-12-22 DIAGNOSIS — K219 Gastro-esophageal reflux disease without esophagitis: Secondary | ICD-10-CM | POA: Diagnosis not present

## 2020-12-22 DIAGNOSIS — M5416 Radiculopathy, lumbar region: Secondary | ICD-10-CM | POA: Diagnosis not present

## 2020-12-22 DIAGNOSIS — R110 Nausea: Secondary | ICD-10-CM

## 2020-12-22 DIAGNOSIS — F319 Bipolar disorder, unspecified: Secondary | ICD-10-CM | POA: Diagnosis not present

## 2020-12-22 DIAGNOSIS — I509 Heart failure, unspecified: Secondary | ICD-10-CM | POA: Diagnosis not present

## 2020-12-22 DIAGNOSIS — M48062 Spinal stenosis, lumbar region with neurogenic claudication: Secondary | ICD-10-CM | POA: Diagnosis not present

## 2020-12-22 DIAGNOSIS — Z4789 Encounter for other orthopedic aftercare: Secondary | ICD-10-CM | POA: Diagnosis not present

## 2020-12-22 DIAGNOSIS — Z9181 History of falling: Secondary | ICD-10-CM | POA: Diagnosis not present

## 2020-12-22 DIAGNOSIS — I110 Hypertensive heart disease with heart failure: Secondary | ICD-10-CM | POA: Diagnosis not present

## 2020-12-22 DIAGNOSIS — E119 Type 2 diabetes mellitus without complications: Secondary | ICD-10-CM | POA: Diagnosis not present

## 2020-12-22 MED ORDER — ONDANSETRON 4 MG PO TBDP: 4.0000 mg | ORAL_TABLET | Freq: Three times a day (TID) | ORAL | 0 refills | Status: DC | PRN

## 2020-12-22 NOTE — Telephone Encounter (Signed)
Pt's DPR called stating that she is in so much pain that she hasn't been able to eat. He stated that he has tried to call the surgeons office to notify them of what has been going on.   DPR was advised that this particular medication may NOT be covered by her insurance and may have to pay out of pocket. Also encouraged to contact the surgeons office. He stated that he is going to go by their office to speak to someone.

## 2020-12-22 NOTE — Progress Notes (Signed)
Hi Donna Sexton, on the preliminary labs that I did your serum osmolality is a little bit lower than I would expect.  So really like to refer you to an endocrinologist so we can best figure out what is causing this how much fluid you drink in a day?  Sometimes actually drinking too much fluid can trigger this and I just want to make sure.  Definitely would like to go ahead and refer you to an endocrinologist to help [place] figure this out.

## 2020-12-24 ENCOUNTER — Telehealth: Payer: Self-pay | Admitting: *Deleted

## 2020-12-24 NOTE — Telephone Encounter (Signed)
Donna Sexton called asking for HH orders for 1x week x 4 wk f/u after lumbar sugery and med management.   I informed her that there was an order for Donna Sexton placed for her. Donna stated that this is usually signed by the pcp unless the patient hasn't been seen by a primary care doctor.   I told her that I would send the request to Dr. Linford Arnold and call her back if she is ok with signing off on this. She voiced understanding and agreed and asked that I call back to let her know.   Telephone Encounter - Azalee Course, RN - 12/21/2020 10:32 AM EST Formatting of this note might be different from the original. Called Donna Sexton in [place] to advise we would be sending a referral and find out what their turn around time is. Was advised they would contact the patient within 48 hours to initiate services if they accept her, and they would contact our office the same day if they did not accept.. Faxed referral, insurance, demographics and discharge orders to Donna Sexton and notified patient via voicemail.   Telephone Encounter - Skip Estimable, PA-C - 12/21/2020 9:39 AM EST Formatting of this note might be different from the original. I called Ms. Donna Sexton. She reports she is having severe difficulty with ambulation. She reports this was due to severe leg weakness. She stopped her muscle relaxant and feels this has slightly improved, but she is still only able to walk very short distances. She has had to call her neighbor to help her husband get her off of the floor x 2.   Her husband then got on the phone and reports she is barely able to walk.   Both expressed interest in Home Health. I have ordered this referral.   I have recommended proceeding to the ED due to the acuity and progressive nature of her leg weakness. It is possible she is having leg weakness unrelated to her surgery such as a stroke.  Her husband reports he cannot bring her to the ED due to his own appt today at 6 and he cannot afford  an ambulance transfer. He voiced understanding that proceeding to the ED is my recommendation and he will try his best to do so after his appointment today.   Donna Sexton - I have ordered Home Health. Can you please work on getting someone to their home ASAP to help? They are unable to proceed to the ED due to financial and scheduling issues and I would like to get them help as soon as we can. Please keep husband up to date on what you find! He would like an update.

## 2020-12-25 NOTE — Telephone Encounter (Signed)
Left detailed message on Donna Sexton's voicemail with [facility].

## 2020-12-26 ENCOUNTER — Encounter: Payer: Self-pay | Admitting: Family Medicine

## 2020-12-28 ENCOUNTER — Telehealth: Payer: Self-pay

## 2020-12-28 NOTE — Telephone Encounter (Signed)
Don with [facility] advised.

## 2020-12-28 NOTE — Telephone Encounter (Signed)
[name] OT with [facility] called requesting for postsurgical OT orders for patient. 1 x a week for 2 weeks. 0x a week for 1 week. 1 x a week for 1 week for home exercise program, ADL transfers, IADL and exercise. Roe Coombs can be reached at [phone]. Per previous telephone encounter dated 12/24/20, orders will need to be sent to patients surgeon. Don advised of message. Will forward to Dr. Linford Arnold for review.

## 2020-12-28 NOTE — Telephone Encounter (Signed)
Send orders to surgeons office.

## 2020-12-28 NOTE — Telephone Encounter (Signed)
Pls call pt to schedule.

## 2020-12-29 ENCOUNTER — Encounter: Payer: Self-pay | Admitting: Family Medicine

## 2020-12-29 ENCOUNTER — Other Ambulatory Visit: Payer: Self-pay

## 2020-12-29 ENCOUNTER — Ambulatory Visit (INDEPENDENT_AMBULATORY_CARE_PROVIDER_SITE_OTHER): Payer: Medicare Other | Admitting: Family Medicine

## 2020-12-29 VITALS — BP 115/76 | HR 77 | Temp 98.2°F | Resp 18 | Wt 229.0 lb

## 2020-12-29 DIAGNOSIS — Z4802 Encounter for removal of sutures: Secondary | ICD-10-CM | POA: Diagnosis not present

## 2020-12-29 DIAGNOSIS — E871 Hypo-osmolality and hyponatremia: Secondary | ICD-10-CM

## 2020-12-29 DIAGNOSIS — R5383 Other fatigue: Secondary | ICD-10-CM

## 2020-12-29 DIAGNOSIS — R531 Weakness: Secondary | ICD-10-CM

## 2020-12-29 NOTE — Progress Notes (Signed)
Established Patient Office Visit  Subjective:  Patient ID: Donna Sexton, female    DOB: Aug 22, 1950  Age: 70 y.o. MRN: [id number]  CC:  Chief Complaint  Patient presents with   Follow up     Patient would like to proceed with referral to Endocrinology    Discuss weakness    Patient stated the weakness is improving now that she is walking more.     HPI [name] presents for hospital follow-up after lumbar laminectomy on December 8.  Unfortunately not long after she was discharged home she had a severe episode of diarrhea that lasted several days and that really drained her and made her feel more weak.  Today she is actually feeling a little better and is getting up and moving around little bit more.  Physical therapy and nursing have been coming out from [facility].  She did have the sutures removed today and wanted to know if she could use a little bit of heat on her low back.  No fevers or chills.  No drainage.  Just feels weak and exhausted.  She says that she has been falling asleep during the daytime which is not like her.  She has been trying to eat more healthy she really has not had much of an appetite though but it feels like it is gradually getting a little bit better.  Past Medical History:  Diagnosis Date   ANEMIA 11/12/2007   Qualifier: Diagnosis of  By: Tiney Rouge CMA, Ellison Hughs     Anxiety    Arthritis    ASTHMA NOS W/ACUTE EXACERBATION 01/13/2010   Qualifier: Diagnosis of  By: Ronnald Ramp MD, Arvid Right.    BIPOLAR DISORDER UNSPECIFIED 10/29/2007   Qualifier: Diagnosis of  By: Ronnald Ramp MD, [place] MIGRAINE 12/29/2008   Qualifier: Diagnosis of  By: Ronnald Ramp MD, Arvid Right.    GERD 07/28/2009   Qualifier: Diagnosis of  By: Asa Lente MD, Jannifer Rodney    HYPERLIPIDEMIA 10/29/2007   Qualifier: Diagnosis of  By: Ronnald Ramp MD, Arvid Right.    HYPERTENSION, BENIGN ESSENTIAL 10/29/2007   Qualifier: Diagnosis of  By: Ronnald Ramp MD, Arvid Right.    OSTEOPENIA 10/30/2007   Qualifier: Diagnosis of   By: Ronnald Ramp MD, [place] BLADDER 02/21/2008   Qualifier: Diagnosis of  By: Jenny Reichmann MD, [place] 12/31/2009   Qualifier: Diagnosis of  By: Ronnald Ramp MD, Arvid Right.     Past Surgical History:  Procedure Laterality Date   ABDOMINAL HYSTERECTOMY     BREAST IMPLANT REMOVAL Bilateral 2022   BREAST SURGERY  01/10/1978   implants bilateral   CARPAL TUNNEL RELEASE     CHOLECYSTECTOMY     HERNIA REPAIR     PAROTID ENDOSCOPY     TUBAL LIGATION      Family History  Problem Relation Age of Onset   Heart attack Mother    Hypertension Mother    Heart failure Mother    Heart attack Father    Hypertension Father    Diabetes Father    Heart failure Brother    Alcohol abuse Maternal Uncle    Breast cancer Neg Hx     Social History   Socioeconomic History   Marital status: Divorced    Spouse name: Not on file   Number of children: 2   Years of education: 9   Highest education level: Some college, no degree  Occupational History   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 **PATIENT NEEDS OFFICE VISIT WITH PCP FOR ADDITIONAL REFILLS** 30 tablet 0   celecoxib (CELEBREX) 200 MG capsule Take by mouth.     Cholecalciferol  50 MCG (2000 UT) CAPS Take 50 Int'l Units by mouth.     diphenoxylate-atropine (LOMOTIL) 2.5-0.025 MG tablet TAKE 1 TABLET FOUR TIMES DAILY AS NEEDED FOR DIARRHEA/LOOSE STOOLS. 30 tablet 0   divalproex (DEPAKOTE ER) 500 MG 24 hr tablet TAKE 3 TABLETS AT BEDTIME. 270 tablet 0   doxepin (SINEQUAN) 25 MG capsule Take 1 capsule (25 mg total) by mouth every evening. 90 capsule 0   furosemide (LASIX) 40 MG tablet Take 40 mg by mouth daily.     labetalol (NORMODYNE) 200 MG tablet Take 200 mg by mouth 2 (two) times daily.     ondansetron (ZOFRAN-ODT) 4 MG disintegrating tablet Take 1 tablet (4 mg total) by mouth every 8 (eight) hours as needed for nausea or vomiting. 20 tablet 0   oxybutynin (DITROPAN) 5 MG tablet TAKE ONE TABLET BY MOUTH TWICE DAILY 180 tablet 0   pregabalin (LYRICA) 50 MG capsule Take 1 capsule (50 mg total) by mouth 3 (three) times daily for 10 days, THEN 1-2 capsules (50-100 mg total) 3 (three) times daily for 10 days. Go slow to increase as can cause sedation. 120 capsule 0   promethazine (PHENERGAN) 25 MG tablet TAKE ONE TABLET BY MOUTH EVERY 8 HOURS AS NEEDED FOR NAUSEA OR FOR VOMITING 90 tablet 0   furosemide (LASIX) 40 MG tablet Take 40 mg by mouth daily.     tiZANidine (ZANAFLEX) 4 MG tablet TAKE ONE TABLET BY MOUTH AT BEDTIME AS NEEDED FOR MUSCLE SPASMS 30 tablet 1   No facility-administered medications prior to visit.    Allergies  Allergen Reactions   Morphine Nausea And Vomiting    Other reaction(s): Other (See Comments) Unspecified reaction    Other Other (See Comments)    Allergic to muscle relaxers -unknown reaction Flexeril only can take cyclobenzaprine.    ROS Review of Systems    Objective:    Physical Exam Constitutional:      Appearance: Normal appearance. She is well-developed.  HENT:     Head: Normocephalic and atraumatic.  Cardiovascular:     Rate and Rhythm: Normal rate and regular rhythm.     Heart sounds: Normal heart sounds.  Pulmonary:      Effort: Pulmonary effort is normal.     Breath sounds: Normal breath sounds.  Skin:    General: Skin is warm and dry.     Comments: Incision on her low  back looks clean dry and intact.  There is just a little bit of surrounding erythema but no streaking.  There is a little bit of edema at the top of the incision.  Steri-Strips in place.  No active drainage.  She does have a bruise on her right low back just above the SI joint.  Neurological:     Mental Status: She is alert and oriented to person, place, and time.  Psychiatric:        Behavior: Behavior normal.    BP 115/76    Pulse 77    Temp 98.2 F (36.8 C)    Resp 18    Wt 229 lb (103.9 kg)    SpO2 97%    BMI 39.31 kg/m  Wt Readings from Last 3 Encounters:  12/29/20 229 lb (103.9 kg)  10/06/20 228 lb (103.4 kg)  09/15/20 227 lb 11.2 oz (103.3 kg)     Health Maintenance Due  Topic Date Due   COVID-19 Vaccine (4 - Booster for Pfizer series) 12/02/2019   COLONOSCOPY (Pts 45-85yr Insurance coverage will need to be confirmed)  06/30/2020    There are no preventive care reminders to display for this patient.  Lab Results  Component Value Date   TSH 2.56 09/16/2020   Lab Results  Component Value Date   WBC 5.6 09/16/2020   HGB 12.8 09/16/2020   HCT 36.9 09/16/2020   MCV 95.6 09/16/2020   PLT 191 09/16/2020   Lab Results  Component Value Date   NA 130 (L) 12/15/2020   K 4.6 09/16/2020   CO2 29 09/16/2020   GLUCOSE 87 09/16/2020   BUN 21 09/16/2020   CREATININE 0.73 09/16/2020   BILITOT 0.7 09/16/2020   ALKPHOS 52 02/03/2016   AST 13 09/16/2020   ALT 11 09/16/2020   PROT 5.8 (L) 09/16/2020   ALBUMIN 4.0 02/03/2016   CALCIUM 9.0 09/16/2020   EGFR 88 09/16/2020   Lab Results  Component Value Date   CHOL 157 09/16/2020   Lab Results  Component Value Date   HDL 66 09/16/2020   Lab Results  Component Value Date   LDLCALC 77 09/16/2020   Lab Results  Component Value Date   TRIG 60 09/16/2020   Lab  Results  Component Value Date   CHOLHDL 2.4 09/16/2020   Lab Results  Component Value Date   HGBA1C 5.3 09/16/2020      Assessment & Plan:   Problem List Items Addressed This Visit   None Visit Diagnoses     Hyponatremia    -  Primary   Relevant Orders   Ambulatory referral to Endocrinology   CBC with Differential/Platelet   COMPLETE METABOLIC PANEL WITH GFR   Other fatigue       Relevant Orders   CBC with Differential/Platelet   COMPLETE METABOLIC PANEL WITH GFR   Weakness       Relevant Orders   CBC with Differential/Platelet   COMPLETE METABOLIC PANEL WITH GFR       No orders of the defined types were placed in this encounter.   Follow-up: Return in about 2 months (around 03/01/2021) for recheck .    CBeatrice Lecher MD

## 2020-12-30 LAB — COMPLETE METABOLIC PANEL WITH GFR
AG Ratio: 1.7 (calc) (ref 1.0–2.5)
ALT: 31 U/L — ABNORMAL HIGH (ref 6–29)
AST: 33 U/L (ref 10–35)
Albumin: 3.8 g/dL (ref 3.6–5.1)
Alkaline phosphatase (APISO): 81 U/L (ref 37–153)
BUN: 14 mg/dL (ref 7–25)
CO2: 31 mmol/L (ref 20–32)
Calcium: 9.4 mg/dL (ref 8.6–10.4)
Chloride: 96 mmol/L — ABNORMAL LOW (ref 98–110)
Creat: 0.87 mg/dL (ref 0.60–1.00)
Globulin: 2.2 g/dL (calc) (ref 1.9–3.7)
Glucose, Bld: 117 mg/dL — ABNORMAL HIGH (ref 65–99)
Potassium: 4.4 mmol/L (ref 3.5–5.3)
Sodium: 135 mmol/L (ref 135–146)
Total Bilirubin: 0.7 mg/dL (ref 0.2–1.2)
Total Protein: 6 g/dL — ABNORMAL LOW (ref 6.1–8.1)
eGFR: 71 mL/min/{1.73_m2} (ref >=60)

## 2020-12-30 LAB — CBC WITH DIFFERENTIAL/PLATELET
Absolute Monocytes: 798 cells/uL (ref 200–950)
Basophils Absolute: 77 cells/uL (ref 0–200)
Basophils Relative: 1.4 %
Eosinophils Absolute: 429 cells/uL (ref 15–500)
Eosinophils Relative: 7.8 %
HCT: 33.6 % — ABNORMAL LOW (ref 35.0–45.0)
Hemoglobin: 11.6 g/dL — ABNORMAL LOW (ref 11.7–15.5)
Lymphs Abs: 897 cells/uL (ref 850–3900)
MCH: 31.6 pg (ref 27.0–33.0)
MCHC: 34.5 g/dL (ref 32.0–36.0)
MCV: 91.6 fL (ref 80.0–100.0)
MPV: 9.8 fL (ref 7.5–12.5)
Monocytes Relative: 14.5 %
Neutro Abs: 3300 cells/uL (ref 1500–7800)
Neutrophils Relative %: 60 %
Platelets: 272 10*3/uL (ref 140–400)
RBC: 3.67 10*6/uL — ABNORMAL LOW (ref 3.80–5.10)
RDW: 11.9 % (ref 11.0–15.0)
Total Lymphocyte: 16.3 %
WBC: 5.5 10*3/uL (ref 3.8–10.8)

## 2020-12-31 NOTE — Progress Notes (Signed)
Hi Donna Sexton, your hemoglobin is down just a little bit.  About what we would expect after surgery so nothing severe typically the bone marrow should rebound and improve that hemoglobin over the next month.  Your protein stores were just a little borderline low similar to what they have been but I do just really encourage you to work on increasing your protein in your diet will also promote healing.  In one of your liver enzymes which is slightly elevated so do want a keep an eye on that and plan to recheck your liver function in a month.

## 2021-01-06 ENCOUNTER — Other Ambulatory Visit: Payer: Self-pay | Admitting: Family Medicine

## 2021-01-07 ENCOUNTER — Other Ambulatory Visit: Payer: Self-pay | Admitting: *Deleted

## 2021-01-07 ENCOUNTER — Encounter: Payer: Self-pay | Admitting: Family Medicine

## 2021-01-07 MED ORDER — PREGABALIN 50 MG PO CAPS: 50.0000 mg | ORAL_CAPSULE | Freq: Three times a day (TID) | ORAL | 2 refills | Status: DC

## 2021-01-07 MED ORDER — PREGABALIN 150 MG PO CAPS: 150.0000 mg | ORAL_CAPSULE | Freq: Two times a day (BID) | ORAL | 0 refills | Status: DC

## 2021-01-07 NOTE — Telephone Encounter (Signed)
Meds ordered this encounter  Medications   pregabalin (LYRICA) 50 MG capsule    Sig: Take 1 capsule (50 mg total) by mouth 3 (three) times daily.    Dispense:  90 capsule    Refill:  2

## 2021-01-07 NOTE — Telephone Encounter (Signed)
Kim w/[facility] called and stated that pt's Lyrica is supposed to be 150 mg BID the prescription was sent in for 50 mg BID. She verified that the patient has been on this dosage for several months.  Dr. Linford Arnold informed and will resend for the 150 mg BID

## 2021-01-07 NOTE — Telephone Encounter (Signed)
Spoke with Donna Sexton at [facility] and DC'd all RXs for Pregabalin.  They need new RX for 50mg  TID because the previous 50mg  RX was prescribed as BID.  , CMA

## 2021-01-07 NOTE — Telephone Encounter (Signed)
I would much rather have her on less medications so if she is really just using the 50 mgs 3 times a day then if she has not picked up the 150s lets cancel it and have them fill a prescription for 50 mg 3 times a day for a total of 90 tabs for the month.

## 2021-01-07 NOTE — Telephone Encounter (Signed)
Please call patient and see what dose she ended up titrating up to.

## 2021-01-15 ENCOUNTER — Telehealth: Payer: Self-pay | Admitting: *Deleted

## 2021-01-15 ENCOUNTER — Encounter: Payer: Self-pay | Admitting: Family Medicine

## 2021-01-15 NOTE — Telephone Encounter (Signed)
Pt lvm stating that her BP's have been elevated running around 130-140/90-100. Denies any CP, SOB, lightheaded.   She takes Labetolol 200 mg bid. She asked if there was anything else she should be doing?   Spoke w/pt she stated that this has been going on for about 2 weeks. She states that it fine before her surgery. Asked if she has noticed any swelling she stated that she has not.   She hasn't been eating any salty foods, drinking more water. Asked how many times she has been checking her bp about 5 xs. I told her that it was too much.  Pt was advised to check 1-2 hours after she takes her bp medication in the morning and repeat this again in the evening. She voiced understanding and agreed.  Told her to check her bp over the weekend and to send a my chart Monday. Advised that if her bp gets really high or anything changes to go to the ED/UC

## 2021-01-21 ENCOUNTER — Encounter: Payer: Self-pay | Admitting: Family Medicine

## 2021-01-21 MED ORDER — PROMETHAZINE HCL 25 MG PO TABS: ORAL_TABLET | ORAL | 0 refills | Status: DC

## 2021-01-22 DIAGNOSIS — Z4789 Encounter for other orthopedic aftercare: Secondary | ICD-10-CM | POA: Diagnosis not present

## 2021-01-22 DIAGNOSIS — M48062 Spinal stenosis, lumbar region with neurogenic claudication: Secondary | ICD-10-CM | POA: Diagnosis not present

## 2021-01-22 DIAGNOSIS — E119 Type 2 diabetes mellitus without complications: Secondary | ICD-10-CM | POA: Diagnosis not present

## 2021-01-22 DIAGNOSIS — K219 Gastro-esophageal reflux disease without esophagitis: Secondary | ICD-10-CM | POA: Diagnosis not present

## 2021-01-22 DIAGNOSIS — I509 Heart failure, unspecified: Secondary | ICD-10-CM | POA: Diagnosis not present

## 2021-01-22 DIAGNOSIS — Z9181 History of falling: Secondary | ICD-10-CM | POA: Diagnosis not present

## 2021-01-22 DIAGNOSIS — M5416 Radiculopathy, lumbar region: Secondary | ICD-10-CM | POA: Diagnosis not present

## 2021-01-22 DIAGNOSIS — I110 Hypertensive heart disease with heart failure: Secondary | ICD-10-CM | POA: Diagnosis not present

## 2021-01-22 DIAGNOSIS — F319 Bipolar disorder, unspecified: Secondary | ICD-10-CM | POA: Diagnosis not present

## 2021-01-25 ENCOUNTER — Other Ambulatory Visit: Payer: Self-pay

## 2021-01-25 ENCOUNTER — Ambulatory Visit (INDEPENDENT_AMBULATORY_CARE_PROVIDER_SITE_OTHER): Payer: Medicare Other | Admitting: Family Medicine

## 2021-01-25 VITALS — BP 135/83 | HR 80 | Temp 98.1°F | Resp 16 | Ht 64.0 in | Wt 232.1 lb

## 2021-01-25 DIAGNOSIS — K219 Gastro-esophageal reflux disease without esophagitis: Secondary | ICD-10-CM | POA: Diagnosis not present

## 2021-01-25 DIAGNOSIS — R0789 Other chest pain: Secondary | ICD-10-CM

## 2021-01-25 DIAGNOSIS — I10 Essential (primary) hypertension: Secondary | ICD-10-CM

## 2021-01-25 MED ORDER — AMLODIPINE BESYLATE 2.5 MG PO TABS: 2.5000 mg | ORAL_TABLET | Freq: Every day | ORAL | 0 refills | Status: DC

## 2021-01-25 NOTE — Assessment & Plan Note (Signed)
Blood pressure is better here today than what she has been getting at home.  But we could add 2.5 mg of amlodipine to see if that helps with the home pressures and have her come back in about 2 weeks, with her home blood pressure cuff and see if that looks better

## 2021-01-25 NOTE — Progress Notes (Signed)
Established Patient Office Visit  Subjective:  Patient ID: Donna Sexton, female    DOB: [birth year]  Age: 71 y.o. MRN: [id number]  CC:  Chief Complaint  Patient presents with   Discuss BP medication    HPI Donna Sexton presents for hypertension.  She reports that her blood pressures have been a little bit more elevated than usual in the last 2 weeks after having had surgery.  She takes her labetalol daily.  She increased to 1-1/2 tabs in the morning and a whole tab in the evening per cardiology.  But says her blood pressures are running in the 150s over 90s.  Complains of right-sided intermittent chest pain that just comes on randomly usually when it happens she takes a cimetidine 20 mg over-the-counter and usually gets pretty quick relief.  She says it happened again this morning and she had not eaten anything.  Past Medical History:  Diagnosis Date   ANEMIA 11/12/2007   Qualifier: Diagnosis of  By: Tiney Rouge CMA, Ellison Hughs     Anxiety    Arthritis    ASTHMA NOS W/ACUTE EXACERBATION 01/13/2010   Qualifier: Diagnosis of  By: Ronnald Ramp MD, Arvid Right.    BIPOLAR DISORDER UNSPECIFIED 10/29/2007   Qualifier: Diagnosis of  By: Ronnald Ramp MD, [place] MIGRAINE 12/29/2008   Qualifier: Diagnosis of  By: Ronnald Ramp MD, Arvid Right.    GERD 07/28/2009   Qualifier: Diagnosis of  By: Asa Lente MD, Jannifer Rodney    HYPERLIPIDEMIA 10/29/2007   Qualifier: Diagnosis of  By: Ronnald Ramp MD, Arvid Right.    HYPERTENSION, BENIGN ESSENTIAL 10/29/2007   Qualifier: Diagnosis of  By: Ronnald Ramp MD, Arvid Right.    OSTEOPENIA 10/30/2007   Qualifier: Diagnosis of  By: Ronnald Ramp MD, [place] BLADDER 02/21/2008   Qualifier: Diagnosis of  By: Jenny Reichmann MD, [place] 12/31/2009   Qualifier: Diagnosis of  By: Ronnald Ramp MD, Arvid Right.     Past Surgical History:  Procedure Laterality Date   ABDOMINAL HYSTERECTOMY     BREAST IMPLANT REMOVAL Bilateral 2022   BREAST SURGERY  01/10/1978   implants bilateral   CARPAL  TUNNEL RELEASE     CHOLECYSTECTOMY     HERNIA REPAIR     PAROTID ENDOSCOPY     TUBAL LIGATION      Family History  Problem Relation Age of Onset   Heart attack Mother    Hypertension Mother    Heart failure Mother    Heart attack Father    Hypertension Father    Diabetes Father    Heart failure Brother    Alcohol abuse Maternal Uncle    Breast cancer Neg Hx     Social History   Socioeconomic History   Marital status: Divorced    Spouse name: Not on file   Number of children: 2   Years of education: 9   Highest education level: Some college, no degree  Occupational History   Occupation: Programmer, applications    Comment: retired  Tobacco Use   Smoking status: Never   Smokeless tobacco: Never  Scientific laboratory technician Use: Never used  Substance and Sexual Activity   Alcohol use: No   Drug use: No   Sexual activity: Not Currently  Other Topics Concern   Not on file  Social History Narrative   Take care of her dog.    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 **PATIENT NEEDS OFFICE VISIT WITH PCP FOR ADDITIONAL REFILLS** 30 tablet 0   Cholecalciferol 50 MCG (2000 UT) CAPS Take 50 Int'l Units by mouth.     diphenoxylate-atropine (LOMOTIL) 2.5-0.025 MG tablet TAKE 1 TABLET FOUR TIMES DAILY AS NEEDED FOR DIARRHEA/LOOSE STOOLS. 30 tablet 0   divalproex (DEPAKOTE ER) 500 MG 24 hr tablet TAKE 3 TABLETS AT BEDTIME. 270 tablet 0   doxepin (SINEQUAN) 25 MG capsule Take 1 capsule (25 mg total) by mouth every evening. 90 capsule 0   furosemide (LASIX) 40 MG tablet Take 40 mg by  mouth daily.     labetalol (NORMODYNE) 200 MG tablet Take 200 mg by mouth 2 (two) times daily.     oxybutynin (DITROPAN) 5 MG tablet TAKE ONE TABLET BY MOUTH TWICE DAILY 180 tablet 0   pregabalin (LYRICA) 50 MG capsule Take 1 capsule (50 mg total) by mouth 3 (three) times daily. 90 capsule 2   promethazine (PHENERGAN) 25 MG tablet TAKE ONE TABLET BY MOUTH EVERY 8 HOURS AS NEEDED FOR NAUSEA OR FOR VOMITING 90 tablet 0   ondansetron (ZOFRAN-ODT) 4 MG disintegrating tablet Take 1 tablet (4 mg total) by mouth every 8 (eight) hours as needed for nausea or vomiting. 20 tablet 0   furosemide (LASIX) 40 MG tablet Take 40 mg by mouth daily.     No facility-administered medications prior to visit.    Allergies  Allergen Reactions   Morphine Nausea And Vomiting    Other reaction(s): Other (See Comments) Unspecified reaction    Other Other (See Comments)    Allergic to muscle relaxers -unknown reaction Flexeril only can take cyclobenzaprine.    ROS Review of Systems    Objective:    Physical Exam Constitutional:      Appearance: Normal appearance. She is well-developed.  HENT:     Head: Normocephalic and atraumatic.  Cardiovascular:     Rate and Rhythm: Normal rate and regular rhythm.     Heart sounds: Normal heart sounds.  Pulmonary:     Effort: Pulmonary effort is normal.     Breath sounds: Normal breath sounds.  Skin:    General: Skin is warm and dry.  Neurological:     Mental Status: She is alert and oriented to person, place, and time.  Psychiatric:        Behavior: Behavior normal.    BP 135/83    Pulse 80    Temp 98.1 F (36.7 C)    Resp 16    Ht '5\' 4"'  (1.626 m)    Wt 232 lb 1.6 oz (105.3 kg)    BMI 39.84 kg/m  Wt Readings from Last 3 Encounters:  01/25/21 232 lb 1.6 oz (105.3 kg)  12/29/20 229 lb (103.9 kg)  10/06/20 228 lb (103.4 kg)     Health Maintenance Due  Topic Date Due   COVID-19 Vaccine (4 - Booster for [place] series) 12/02/2019   COLONOSCOPY (Pts  45-23yr Insurance coverage will need to be confirmed)  06/30/2020    There are no preventive care reminders to display  for this patient.  Lab Results  Component Value Date   TSH 2.56 09/16/2020   Lab Results  Component Value Date   WBC 5.5 12/29/2020   HGB 11.6 (L) 12/29/2020   HCT 33.6 (L) 12/29/2020   MCV 91.6 12/29/2020   PLT 272 12/29/2020   Lab Results  Component Value Date   NA 135 12/29/2020   K 4.4 12/29/2020   CO2 31 12/29/2020   GLUCOSE 117 (H) 12/29/2020   BUN 14 12/29/2020   CREATININE 0.87 12/29/2020   BILITOT 0.7 12/29/2020   ALKPHOS 52 02/03/2016   AST 33 12/29/2020   ALT 31 (H) 12/29/2020   PROT 6.0 (L) 12/29/2020   ALBUMIN 4.0 02/03/2016   CALCIUM 9.4 12/29/2020   EGFR 71 12/29/2020   Lab Results  Component Value Date   CHOL 157 09/16/2020   Lab Results  Component Value Date   HDL 66 09/16/2020   Lab Results  Component Value Date   LDLCALC 77 09/16/2020   Lab Results  Component Value Date   TRIG 60 09/16/2020   Lab Results  Component Value Date   CHOLHDL 2.4 09/16/2020   Lab Results  Component Value Date   HGBA1C 5.3 09/16/2020      Assessment & Plan:   Problem List Items Addressed This Visit       Cardiovascular and Mediastinum   Essential hypertension - Primary (Chronic)    Blood pressure is better here today than what she has been getting at home.  But we could add 2.5 mg of amlodipine to see if that helps with the home pressures and have her come back in about 2 weeks, with her home blood pressure cuff and see if that looks better      Relevant Medications   amLODipine (NORVASC) 2.5 MG tablet     Digestive   GERD    I do think some of her reflux symptoms are GERD related since she tends to get relief with cimetidine.  She says it is usually on the right side of the chest.  Any point it is changing or worsening or not responding to the medication then we can work-up further.      Other Visit Diagnoses     Atypical  chest pain           Meds ordered this encounter  Medications   amLODipine (NORVASC) 2.5 MG tablet    Sig: Take 1 tablet (2.5 mg total) by mouth daily.    Dispense:  30 tablet    Refill:  0    Follow-up: Return in about 2 weeks (around 02/08/2021) for Nurse visit.    Beatrice Lecher, MD

## 2021-01-25 NOTE — Assessment & Plan Note (Signed)
I do think some of her reflux symptoms are GERD related since she tends to get relief with cimetidine.  She says it is usually on the right side of the chest.  Any point it is changing or worsening or not responding to the medication then we can work-up further.

## 2021-01-27 ENCOUNTER — Ambulatory Visit (INDEPENDENT_AMBULATORY_CARE_PROVIDER_SITE_OTHER): Payer: Medicare Other

## 2021-01-27 ENCOUNTER — Encounter: Payer: Self-pay | Admitting: Sports Medicine

## 2021-01-27 ENCOUNTER — Other Ambulatory Visit: Payer: Self-pay

## 2021-01-27 ENCOUNTER — Ambulatory Visit (INDEPENDENT_AMBULATORY_CARE_PROVIDER_SITE_OTHER): Payer: Medicare Other | Admitting: Sports Medicine

## 2021-01-27 ENCOUNTER — Telehealth: Payer: Self-pay | Admitting: Sports Medicine

## 2021-01-27 DIAGNOSIS — G8929 Other chronic pain: Secondary | ICD-10-CM | POA: Diagnosis not present

## 2021-01-27 DIAGNOSIS — M1712 Unilateral primary osteoarthritis, left knee: Secondary | ICD-10-CM | POA: Diagnosis not present

## 2021-01-27 DIAGNOSIS — M170 Bilateral primary osteoarthritis of knee: Secondary | ICD-10-CM | POA: Diagnosis not present

## 2021-01-27 DIAGNOSIS — M1711 Unilateral primary osteoarthritis, right knee: Secondary | ICD-10-CM | POA: Diagnosis not present

## 2021-01-27 NOTE — Assessment & Plan Note (Addendum)
Pleasant 71 year old female, morbid obesity, bilateral knee osteoarthritis, last treated by me in 2019 with a series of Orthovisc that worked really well. She has not been back to see me in 4 years, she did go to [facility] and got an injection which did not seem to help very much, she does endorse pain, weakness, instability. I think we need multidisciplinary approach her, today we did bilateral steroid injections, I will work on getting her approved for viscosupplementation. She does need aggressive physical therapy as she has become quite deconditioned. Return to see me to start Orthovisc when approved.

## 2021-01-27 NOTE — Progress Notes (Signed)
° ° °  Procedures performed today:    Procedure: Real-time Ultrasound Guided injection right knee Device: Samsung HS60  Verbal informed consent obtained.  Time-out conducted.  Noted no overlying erythema, induration, or other signs of local infection.  Skin prepped in a sterile fashion.  Local anesthesia: Topical Ethyl chloride.  With sterile technique and under real time ultrasound guidance: Noted effusion, 1 cc Kenalog 40, 2 cc lidocaine, 2 cc bupivacaine injected easily Completed without difficulty  Advised to call if fevers/chills, erythema, induration, drainage, or persistent bleeding.  Images permanently stored and available for review in PACS.  Impression: Technically successful ultrasound guided injection.  Procedure: Real-time Ultrasound Guided injection left knee Device: Samsung HS60  Verbal informed consent obtained.  Time-out conducted.  Noted no overlying erythema, induration, or other signs of local infection.  Skin prepped in a sterile fashion.  Local anesthesia: Topical Ethyl chloride.  With sterile technique and under real time ultrasound guidance: Noted effusion, 1 cc Kenalog 40, 2 cc lidocaine, 2 cc bupivacaine injected easily Completed without difficulty  Advised to call if fevers/chills, erythema, induration, drainage, or persistent bleeding.  Images permanently stored and available for review in PACS.  Impression: Technically successful ultrasound guided injection.  Independent interpretation of notes and tests performed by another provider:   None.  Brief History, Exam, Impression, and Recommendations:    Primary osteoarthritis of both knees Pleasant 71 year old female, morbid obesity, bilateral knee osteoarthritis, last treated by me in 2019 with a series of Orthovisc that worked really well. She has not been back to see me in 4 years, she did go to [facility] and got an injection which did not seem to help very much, she does endorse pain, weakness,  instability. I think we need multidisciplinary approach her, today we did bilateral steroid injections, I will work on getting her approved for viscosupplementation. She does need aggressive physical therapy as she has become quite deconditioned. Return to see me to start Orthovisc when approved.    ___________________________________________ Gwen Her. Dianah Field, M.D., ABFM., CAQSM. Primary Care and [place] of [place] [facility]

## 2021-01-27 NOTE — Telephone Encounter (Signed)
Bilateral [employer] approval please, x-ray confirmed bilateral osteoarthritis, failed NSAIDs, therapy for greater than 6 weeks at home, steroid injections.

## 2021-01-28 NOTE — Telephone Encounter (Signed)
[employer] paperwork faxed to [employer] at [phone] Request is for Orthovisc Pt's insurance prefers Orthovisc Fax confirmation receipt received  

## 2021-02-01 DIAGNOSIS — M545 Low back pain, unspecified: Secondary | ICD-10-CM | POA: Diagnosis not present

## 2021-02-01 DIAGNOSIS — Z4789 Encounter for other orthopedic aftercare: Secondary | ICD-10-CM | POA: Diagnosis not present

## 2021-02-03 NOTE — Telephone Encounter (Signed)
Benefits Investigation Details received from [employer] Injection: Orthovisc  Medical: Deductible does not apply. Once the OOP has been met, patient is covered. Prior Authorization is not required.  PA required: No  Pharmacy: The product is not covered under the pharmacy plan.   Specialty Pharmacy: [facility]  May fill through: [facility] [place] or through specialty pharmacy OV Copay/Coinsurance: $25 Product Copay: 20% Administration Coinsurance: 0% Administration Copay: 0% Deductible:  Out of Pocket Max: $3950 (met: $0)   Cost is $305/week for bilateral injections  Spoke with patient, she declined the injections at this time due to cost. Advised patient that once she meets her OOP Max for the year, there would be no cost to her for the injections. She will let us know if this happens.

## 2021-02-04 ENCOUNTER — Other Ambulatory Visit: Payer: Self-pay

## 2021-02-04 ENCOUNTER — Ambulatory Visit (INDEPENDENT_AMBULATORY_CARE_PROVIDER_SITE_OTHER): Payer: Medicare Other

## 2021-02-04 ENCOUNTER — Ambulatory Visit (INDEPENDENT_AMBULATORY_CARE_PROVIDER_SITE_OTHER): Payer: Medicare Other | Admitting: Sports Medicine

## 2021-02-04 ENCOUNTER — Other Ambulatory Visit: Payer: Self-pay | Admitting: Neurology

## 2021-02-04 DIAGNOSIS — M25511 Pain in right shoulder: Secondary | ICD-10-CM

## 2021-02-04 MED ORDER — LABETALOL HCL 200 MG PO TABS
ORAL_TABLET | ORAL | 1 refills | Status: DC
Start: 2021-02-04 — End: 2021-10-05

## 2021-02-04 MED ORDER — LABETALOL HCL 200 MG PO TABS: 200.0000 mg | ORAL_TABLET | Freq: Two times a day (BID) | ORAL | 1 refills | Status: DC

## 2021-02-04 NOTE — Telephone Encounter (Signed)
Meds ordered this encounter  Medications   DISCONTD: labetalol (NORMODYNE) 200 MG tablet    Sig: Take 1 tablet (200 mg total) by mouth 2 (two) times daily.    Dispense:  180 tablet    Refill:  1   labetalol (NORMODYNE) 200 MG tablet    Sig: 1.5 PO in AM and 1 in PM    Dispense:  225 tablet    Refill:  1    Pls d/c order for 1 po BID

## 2021-02-04 NOTE — Assessment & Plan Note (Signed)
This is a pleasant 71 year old female, worsening right shoulder pain, history of an acute supraspinatus tear with some retraction. She also had some subacromial subdeltoid bursitis. We did an injection today, subacromial. Return to see me 6 weeks as needed.

## 2021-02-04 NOTE — Progress Notes (Signed)
° ° °  Procedures performed today:    Procedure: Real-time Ultrasound Guided injection of the local right subacromial bursa Device: Samsung [id number]  Verbal informed consent obtained.  Time-out conducted.  Noted no overlying erythema, induration, or other signs of local infection.  Skin prepped in a sterile fashion.  Local anesthesia: Topical Ethyl chloride.  With sterile technique and under real time ultrasound guidance: Noted some rotator cuff tearing, 1 cc Kenalog 40, 2 cc lidocaine, 2 cc bupivacaine injected easily Completed without difficulty  Advised to call if fevers/chills, erythema, induration, drainage, or persistent bleeding.  Images permanently stored and available for review in PACS.  Impression: Technically successful ultrasound guided injection.  Independent interpretation of notes and tests performed by another provider:   None.  Brief History, Exam, Impression, and Recommendations:    Acute pain of right shoulder This is a pleasant 71 year old female, worsening right shoulder pain, history of an acute supraspinatus tear with some retraction. She also had some subacromial subdeltoid bursitis. We did an injection today, subacromial. Return to see me 6 weeks as needed.    ___________________________________________ Gwen Her. Dianah Field, M.D., ABFM., CAQSM. Primary Care and [place] Instructor of [employer]

## 2021-02-04 NOTE — Telephone Encounter (Signed)
Patient called and left vm stating her Labetalol was previously prescribed by her former PCP and she would like Dr. Linford Arnold to refill it. Last RX was written for #270 tablets so she has just now ran out. Please advise.

## 2021-02-04 NOTE — Telephone Encounter (Signed)
Patient made aware RX sent.  

## 2021-02-05 ENCOUNTER — Ambulatory Visit (INDEPENDENT_AMBULATORY_CARE_PROVIDER_SITE_OTHER): Payer: Medicare Other

## 2021-02-05 ENCOUNTER — Ambulatory Visit (INDEPENDENT_AMBULATORY_CARE_PROVIDER_SITE_OTHER): Payer: Medicare Other | Admitting: Sports Medicine

## 2021-02-05 DIAGNOSIS — M5030 Other cervical disc degeneration, unspecified cervical region: Secondary | ICD-10-CM

## 2021-02-05 DIAGNOSIS — M542 Cervicalgia: Secondary | ICD-10-CM | POA: Diagnosis not present

## 2021-02-05 MED ORDER — PREDNISONE 50 MG PO TABS: ORAL_TABLET | ORAL | 0 refills | Status: DC

## 2021-02-05 NOTE — Progress Notes (Signed)
° ° °  Procedures performed today:    None.  Independent interpretation of notes and tests performed by another provider:   None.  Brief History, Exam, Impression, and Recommendations:    DDD (degenerative disc disease), cervical Acute onset pain left side of the neck, anterior upper chest, with radiation into the left periscapular region and down the back of the left arm. She has good motion and good strength in her shoulder, she is able to abduct without difficulty. This is classic cervical radicular pain, adding cervical spine x-rays, 5 days of prednisone, she is starting physical therapy at [facility], I would like them to work on her neck as well. Return to see me for this in 6 weeks, MR if no better.    ___________________________________________ Ihor Austin. Benjamin Stain, M.D., ABFM., CAQSM. Primary Care and Sports Medicine [facility]  Adjunct Instructor of Family Medicine  [employer]

## 2021-02-05 NOTE — Assessment & Plan Note (Signed)
Acute onset pain left side of the neck, anterior upper chest, with radiation into the left periscapular region and down the back of the left arm. She has good motion and good strength in her shoulder, she is able to abduct without difficulty. This is classic cervical radicular pain, adding cervical spine x-rays, 5 days of prednisone, she is starting physical therapy at [facility], I would like them to work on her neck as well. Return to see me for this in 6 weeks, MR if no better.

## 2021-02-09 ENCOUNTER — Other Ambulatory Visit: Payer: Self-pay

## 2021-02-09 ENCOUNTER — Ambulatory Visit (INDEPENDENT_AMBULATORY_CARE_PROVIDER_SITE_OTHER): Payer: Medicare Other | Admitting: Family Medicine

## 2021-02-09 VITALS — BP 113/71 | HR 81 | Temp 97.8°F

## 2021-02-09 DIAGNOSIS — R29898 Other symptoms and signs involving the musculoskeletal system: Secondary | ICD-10-CM | POA: Diagnosis not present

## 2021-02-09 DIAGNOSIS — M170 Bilateral primary osteoarthritis of knee: Secondary | ICD-10-CM | POA: Diagnosis not present

## 2021-02-09 DIAGNOSIS — I10 Essential (primary) hypertension: Secondary | ICD-10-CM

## 2021-02-09 DIAGNOSIS — M545 Low back pain, unspecified: Secondary | ICD-10-CM | POA: Diagnosis not present

## 2021-02-09 DIAGNOSIS — Z9889 Other specified postprocedural states: Secondary | ICD-10-CM | POA: Diagnosis not present

## 2021-02-09 MED ORDER — AMLODIPINE BESYLATE 2.5 MG PO TABS: 2.5000 mg | ORAL_TABLET | Freq: Every day | ORAL | 0 refills | Status: DC

## 2021-02-09 NOTE — Progress Notes (Signed)
Patient presents today as a nurse visit for a blood pressure check.  Patient states she is taking her medication as prescribed without any side effects/adverse effects. Medication and allergy list reviewed with patient and the pharmacy has been verified.   HA: No Dizziness/lightheadedness: No Fever: No BA: No Weakness/Fatigue: No  Sinus pain/pressure: No  Runny nose: No  ST: No  ShOB: No  CP: No  Palps: No Abd pain: No Dysuria: No  N/V/C/D: No    Vital Signs at 1:19 PM Blood Pressure: 133/64 Pulse: 75 SpO2: 98%  Vital Signs at 1:33 PM Blood Pressure: 113/71 Pulse: 81 SpO2: 98%   Information shared with Dr. Madilyn Fireman who instructed me to tell pt to continue her current regimen, continue to monitor BP at home and keep a log, and keep f/up OV scheduled for 03/01/2021.  Pt stated she will need a refill of the amlodipine 2.5 mg before coming in for her f/up OV. Refill sent for pt  Pt aware and verbalized understanding.

## 2021-02-09 NOTE — Patient Instructions (Signed)
Managing Your Hypertension Hypertension, also called high blood pressure, is when the force of the blood pressing against the walls of the arteries is too strong. Arteries are blood vessels that carry blood from your heart throughout your body. Hypertension forces the heart to work harder to pump blood and may cause the arteries to become narrow or stiff. Understanding blood pressure readings Your personal target blood pressure may vary depending on your medical conditions, your age, and other factors. A blood pressure reading includes a higher number over a lower number. Ideally, your blood pressure should be below 120/80. You should know that: The first, or top, number is called the systolic pressure. It is a measure of the pressure in your arteries as your heart beats. The second, or bottom number, is called the diastolic pressure. It is a measure of the pressure in your arteries as the heart relaxes. Blood pressure is classified into four stages. Based on your blood pressure reading, your health care provider may use the following stages to determine what type of treatment you need, if any. Systolic pressure and diastolic pressure are measured in a unit called mmHg. Normal Systolic pressure: below 120. Diastolic pressure: below 80. Elevated Systolic pressure: 120-129. Diastolic pressure: below 80. Hypertension stage 1 Systolic pressure: 130-139. Diastolic pressure: 80-89. Hypertension stage 2 Systolic pressure: 140 or above. Diastolic pressure: 90 or above. How can this condition affect me? Managing your hypertension is an important responsibility. Over time, hypertension can damage the arteries and decrease blood flow to important parts of the body, including the brain, heart, and kidneys. Having untreated or uncontrolled hypertension can lead to: A heart attack. A stroke. A weakened blood vessel (aneurysm). Heart failure. Kidney damage. Eye damage. Metabolic syndrome. Memory and  concentration problems. Vascular dementia. What actions can I take to manage this condition? Hypertension can be managed by making lifestyle changes and possibly by taking medicines. Your health care provider will help you make a plan to bring your blood pressure within a normal range. Nutrition  Eat a diet that is high in fiber and potassium, and low in salt (sodium), added sugar, and fat. An example eating plan is called the Dietary Approaches to Stop Hypertension (DASH) diet. To eat this way: Eat plenty of fresh fruits and vegetables. Try to fill one-half of your plate at each meal with fruits and vegetables. Eat whole grains, such as whole-wheat pasta, brown rice, or whole-grain bread. Fill about one-fourth of your plate with whole grains. Eat low-fat dairy products. Avoid fatty cuts of meat, processed or cured meats, and poultry with skin. Fill about one-fourth of your plate with lean proteins such as fish, chicken without skin, beans, eggs, and tofu. Avoid pre-made and processed foods. These tend to be higher in sodium, added sugar, and fat. Reduce your daily sodium intake. Most people with hypertension should eat less than 1,500 mg of sodium a day. Lifestyle  Work with your health care provider to maintain a healthy body weight or to lose weight. Ask what an ideal weight is for you. Get at least 30 minutes of exercise that causes your heart to beat faster (aerobic exercise) most days of the week. Activities may include walking, swimming, or biking. Include exercise to strengthen your muscles (resistance exercise), such as weight lifting, as part of your weekly exercise routine. Try to do these types of exercises for 30 minutes at least 3 days a week. Do not use any products that contain nicotine or tobacco, such as cigarettes, e-cigarettes,   and chewing tobacco. If you need help quitting, ask your health care provider. Control any long-term (chronic) conditions you have, such as high  cholesterol or diabetes. Identify your sources of stress and find ways to manage stress. This may include meditation, deep breathing, or making time for fun activities. Alcohol use Do not drink alcohol if: Your health care provider tells you not to drink. You are pregnant, may be pregnant, or are planning to become pregnant. If you drink alcohol: Limit how much you use to: 0-1 drink a day for women. 0-2 drinks a day for men. Be aware of how much alcohol is in your drink. In the [place] one drink equals one 12 oz bottle of beer (355 mL), one 5 oz glass of wine (148 mL), or one 1 oz glass of hard liquor (44 mL). Medicines Your health care provider may prescribe medicine if lifestyle changes are not enough to get your blood pressure under control and if: Your systolic blood pressure is 130 or higher. Your diastolic blood pressure is 80 or higher. Take medicines only as told by your health care provider. Follow the directions carefully. Blood pressure medicines must be taken as told by your health care provider. The medicine does not work as well when you skip doses. Skipping doses also puts you at risk for problems. Monitoring Before you monitor your blood pressure: Do not smoke, drink caffeinated beverages, or exercise within 30 minutes before taking a measurement. Use the bathroom and empty your bladder (urinate). Sit quietly for at least 5 minutes before taking measurements. Monitor your blood pressure at home as told by your health care provider. To do this: Sit with your back straight and supported. Place your feet flat on the floor. Do not cross your legs. Support your arm on a flat surface, such as a table. Make sure your upper arm is at heart level. Each time you measure, take two or three readings one minute apart and record the results. You may also need to have your blood pressure checked regularly by your health care provider. General information Talk with your health care  provider about your diet, exercise habits, and other lifestyle factors that may be contributing to hypertension. Review all the medicines you take with your health care provider because there may be side effects or interactions. Keep all visits as told by your health care provider. Your health care provider can help you create and adjust your plan for managing your high blood pressure. Where to find more information National Heart, Lung, and Blood Institute: www.nhlbi.nih.gov American Heart Association: www.heart.org Contact a health care provider if: You think you are having a reaction to medicines you have taken. You have repeated (recurrent) headaches. You feel dizzy. You have swelling in your ankles. You have trouble with your vision. Get help right away if: You develop a severe headache or confusion. You have unusual weakness or numbness, or you feel faint. You have severe pain in your chest or abdomen. You vomit repeatedly. You have trouble breathing. These symptoms may represent a serious problem that is an emergency. Do not wait to see if the symptoms will go away. Get medical help right away. Call your local emergency services (911 in the [place] Do not drive yourself to the hospital. Summary Hypertension is when the force of blood pumping through your arteries is too strong. If this condition is not controlled, it may put you at risk for serious complications. Your personal target blood pressure may vary depending on   your medical conditions, your age, and other factors. For most people, a normal blood pressure is less than 120/80. Hypertension is managed by lifestyle changes, medicines, or both. Lifestyle changes to help manage hypertension include losing weight, eating a healthy, low-sodium diet, exercising more, stopping smoking, and limiting alcohol. This information is not intended to replace advice given to you by your health care provider. Make sure you discuss any questions  you have with your health care provider. Document Revised: 01/14/2019 Document Reviewed: 11/27/2018 Elsevier Patient Education  2022 Elsevier Inc.  

## 2021-02-16 DIAGNOSIS — M170 Bilateral primary osteoarthritis of knee: Secondary | ICD-10-CM | POA: Diagnosis not present

## 2021-02-16 DIAGNOSIS — Z9889 Other specified postprocedural states: Secondary | ICD-10-CM | POA: Diagnosis not present

## 2021-02-16 DIAGNOSIS — R29898 Other symptoms and signs involving the musculoskeletal system: Secondary | ICD-10-CM | POA: Diagnosis not present

## 2021-02-16 DIAGNOSIS — M545 Low back pain, unspecified: Secondary | ICD-10-CM | POA: Diagnosis not present

## 2021-02-26 ENCOUNTER — Other Ambulatory Visit: Payer: Self-pay | Admitting: Family Medicine

## 2021-02-26 DIAGNOSIS — Z79899 Other long term (current) drug therapy: Secondary | ICD-10-CM

## 2021-02-26 MED ORDER — ATORVASTATIN CALCIUM 40 MG PO TABS: 40.0000 mg | ORAL_TABLET | Freq: Every day | ORAL | 3 refills | Status: DC

## 2021-02-26 NOTE — Progress Notes (Signed)
Meds ordered this encounter  Medications   atorvastatin (LIPITOR) 40 MG tablet    Sig: Take 1 tablet (40 mg total) by mouth daily.    Dispense:  90 tablet    Refill:  3    

## 2021-02-26 NOTE — Progress Notes (Signed)
[facility] [facility])                                            [place] Team                                        Statin Quality Measure Assessment    02/26/2021  Donna Sexton 01/11/1950 [id number]  Per review of chart and payor information, this patient has been flagged for non-adherence to the following CMS Quality Measure:   [x]  Statin Use in Persons with Diabetes  []  Statin Use in Persons with Cardiovascular Disease  The 10-year ASCVD risk score (Arnett DK, et al., 2019) is: 10.1%   Values used to calculate the score:     Age: 70 years     Sex: Female     Is Non-Hispanic African American: No     Diabetic: No     Tobacco smoker: No     Systolic Blood Pressure: 123456 mmHg     Is BP treated: Yes     HDL Cholesterol: 66 mg/dL     Total Cholesterol: 157 mg/dL  This patient was recently seen in clinic for a follow up appointment. Patient is on atorvastatin 40 mg daily. LDL 77 (as 09/17/1976). Next appointment w/PCP is 03/01/2021. If deemed therapeutically appropriate, please consider assessment of prescription renewal.    Please consider ONE of the following recommendations:   Initiate high intensity statin Atorvastatin 40mg  once daily, #90, 3 refills   Rosuvastatin 20mg  once daily, #90, 3 refills    Initiate moderate intensity          statin with reduced frequency if prior          statin intolerance 1x weekly, #13, 3 refills   2x weekly, #26, 3 refills   3x weekly, #39, 3 refills   Code for past statin intolerance or other exclusions (required annually)  Drug Induced Myopathy G72.0   Myositis, unspecified M60.9   Rhabdomyolysis M62.82   Cirrhosis of liver K74.69   Biliary cirrhosis, unspecified K74.5   Abnormal blood glucose - for SUPD ONLY R73.09   Prediabetes - for SUPD ONLY  R73.03   Polycystic ovarian syndrome E28.2   Adverse effect of antihyperlipidemic and antiarteriosclerotic drugs, initial encounter  [id number]   Thank you for your time,  Kristeen Miss, Arnold Cell: [phone]

## 2021-03-01 ENCOUNTER — Other Ambulatory Visit: Payer: Self-pay

## 2021-03-01 ENCOUNTER — Ambulatory Visit (INDEPENDENT_AMBULATORY_CARE_PROVIDER_SITE_OTHER): Payer: Medicare Other | Admitting: Family Medicine

## 2021-03-01 ENCOUNTER — Encounter: Payer: Self-pay | Admitting: Family Medicine

## 2021-03-01 VITALS — BP 124/70 | HR 71 | Resp 18 | Ht 64.0 in | Wt 240.0 lb

## 2021-03-01 DIAGNOSIS — R748 Abnormal levels of other serum enzymes: Secondary | ICD-10-CM

## 2021-03-01 DIAGNOSIS — I5032 Chronic diastolic (congestive) heart failure: Secondary | ICD-10-CM | POA: Diagnosis not present

## 2021-03-01 DIAGNOSIS — I10 Essential (primary) hypertension: Secondary | ICD-10-CM | POA: Diagnosis not present

## 2021-03-01 DIAGNOSIS — Z1211 Encounter for screening for malignant neoplasm of colon: Secondary | ICD-10-CM

## 2021-03-01 NOTE — Assessment & Plan Note (Signed)
Well controlled. Continue current regimen. Follow up in  6 mo  

## 2021-03-01 NOTE — Progress Notes (Signed)
Established Patient Office Visit  Subjective:  Patient ID: Donna Sexton, female    DOB: 1950/09/01  Age: 71 y.o. MRN: [id number]  CC:  Chief Complaint  Patient presents with   Hypertension    Follow up    Weight Gain    Patient would like to discuss options     HPI Donna Sexton presents for   Hypertension- Pt denies chest pain, SOB, dizziness, or heart palpitations.  Taking meds as directed w/o problems.  Denies medication side effects.  She feels like her home BP machine not working well.  She has been taking her medications without any problems.  She denies any recent onset swelling.  But is concerned because her weight is up about 8 pounds in the last 4 weeks.  Been having difficulty getting to physical therapy.  She goes to [facility] for therapy and it is on the second floor and the elevator has been broken for almost a month.  She really wanted to see the therapist Donna Sexton there because she had seen her previously after having had her shoulder surgery.   Past Medical History:  Diagnosis Date   ANEMIA 11/12/2007   Qualifier: Diagnosis of  By: Tiney Rouge CMA, Ellison Hughs     Anxiety    Arthritis    ASTHMA NOS W/ACUTE EXACERBATION 01/13/2010   Qualifier: Diagnosis of  By: Ronnald Ramp MD, Arvid Right.    BIPOLAR DISORDER UNSPECIFIED 10/29/2007   Qualifier: Diagnosis of  By: Ronnald Ramp MD, [place] MIGRAINE 12/29/2008   Qualifier: Diagnosis of  By: Ronnald Ramp MD, Arvid Right.    GERD 07/28/2009   Qualifier: Diagnosis of  By: Asa Lente MD, Jannifer Rodney    HYPERLIPIDEMIA 10/29/2007   Qualifier: Diagnosis of  By: Ronnald Ramp MD, Arvid Right.    HYPERTENSION, BENIGN ESSENTIAL 10/29/2007   Qualifier: Diagnosis of  By: Ronnald Ramp MD, Arvid Right.    OSTEOPENIA 10/30/2007   Qualifier: Diagnosis of  By: Ronnald Ramp MD, [place] BLADDER 02/21/2008   Qualifier: Diagnosis of  By: Jenny Reichmann MD, [place] 12/31/2009   Qualifier: Diagnosis of  By: Ronnald Ramp MD, Arvid Right.     Past Surgical History:   Procedure Laterality Date   ABDOMINAL HYSTERECTOMY     BREAST IMPLANT REMOVAL Bilateral 2022   BREAST SURGERY  01/10/1978   implants bilateral   CARPAL TUNNEL RELEASE     CHOLECYSTECTOMY     HERNIA REPAIR     PAROTID ENDOSCOPY     TUBAL LIGATION      Family History  Problem Relation Age of Onset   Heart attack Mother    Hypertension Mother    Heart failure Mother    Heart attack Father    Hypertension Father    Diabetes Father    Heart failure Brother    Alcohol abuse Maternal Uncle    Breast cancer Neg Hx     Social History   Socioeconomic History   Marital status: Divorced    Spouse name: Not on file   Number of children: 2   Years of education: 9   Highest education level: Some college, no degree  Occupational History   Occupation: Programmer, applications    Comment: retired  Tobacco Use   Smoking status: Never   Smokeless tobacco: Never  Scientific laboratory technician Use: Never used  Substance and Sexual Activity   Alcohol use: No   Drug use: No   Sexual  activity: Not Currently  Other Topics Concern   Not on file  Social History Narrative   Take care of her dog.    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 30 tablet 0   atorvastatin (LIPITOR) 40 MG tablet Take 1 tablet (40 mg total) by mouth daily. 90 tablet 3   divalproex (DEPAKOTE ER) 500 MG 24 hr tablet TAKE 3 TABLETS AT BEDTIME. 270 tablet 0   doxepin (SINEQUAN) 25 MG capsule Take 1 capsule (25 mg total) by mouth every evening. 90 capsule 0   furosemide (LASIX) 40 MG tablet Take 40 mg by mouth daily.      labetalol (NORMODYNE) 200 MG tablet 1.5 PO in AM and 1 in PM 225 tablet 1   oxybutynin (DITROPAN) 5 MG tablet TAKE ONE TABLET BY MOUTH TWICE DAILY 180 tablet 0   pregabalin (LYRICA) 50 MG capsule Take 1 capsule (50 mg total) by mouth 3 (three) times daily. 90 capsule 2   promethazine (PHENERGAN) 25 MG tablet TAKE ONE TABLET BY MOUTH EVERY 8 HOURS AS NEEDED FOR NAUSEA OR FOR VOMITING 90 tablet 0   Cholecalciferol 50 MCG (2000 UT) CAPS Take 50 Int'l Units by mouth.     cimetidine (TAGAMET) 200 MG tablet Take 200 mg by mouth 2 (two) times daily.     diphenoxylate-atropine (LOMOTIL) 2.5-0.025 MG tablet TAKE 1 TABLET FOUR TIMES DAILY AS NEEDED FOR DIARRHEA/LOOSE STOOLS. 30 tablet 0   predniSONE (DELTASONE) 50 MG tablet One tab PO daily for 5 days. 5 tablet 0   No facility-administered medications prior to visit.    Allergies  Allergen Reactions   Morphine Nausea And Vomiting    Other reaction(s): Other (See Comments) Unspecified reaction    Other Other (See Comments)    Allergic to muscle relaxers -unknown reaction Flexeril only can take cyclobenzaprine.    ROS Review of Systems    Objective:    Physical Exam Constitutional:      Appearance: Normal appearance. She is well-developed.  HENT:     Head: Normocephalic and atraumatic.  Cardiovascular:     Rate and Rhythm: Normal rate and regular rhythm.     Heart sounds: Normal heart sounds.  Pulmonary:     Effort: Pulmonary effort is normal.     Breath sounds: Normal breath sounds.  Skin:    General: Skin is warm and dry.  Neurological:     Mental Status: She is alert and oriented to person, place, and time.  Psychiatric:        Behavior: Behavior normal.    BP 124/70    Pulse 71    Resp 18    Ht _0  (1.626 m)    Wt 240 lb (108.9 kg)    SpO2 96%    BMI 41.20 kg/m  Wt Readings from Last 3 Encounters:  03/01/21 240 lb (108.9 kg)  01/25/21 232 lb 1.6 oz (105.3 kg)  12/29/20 229 lb (103.9 kg)     Health Maintenance Due   Topic Date Due   COLONOSCOPY (  Pts 45-52yr Insurance coverage will need to be confirmed)  06/30/2020    There are no preventive care reminders to display for this patient.  Lab Results  Component Value Date   TSH 2.56 09/16/2020   Lab Results  Component Value Date   WBC 5.5 12/29/2020   HGB 11.6 (L) 12/29/2020   HCT 33.6 (L) 12/29/2020   MCV 91.6 12/29/2020   PLT 272 12/29/2020   Lab Results  Component Value Date   NA 135 12/29/2020   K 4.4 12/29/2020   CO2 31 12/29/2020   GLUCOSE 117 (H) 12/29/2020   BUN 14 12/29/2020   CREATININE 0.87 12/29/2020   BILITOT 0.7 12/29/2020   ALKPHOS 52 02/03/2016   AST 33 12/29/2020   ALT 31 (H) 12/29/2020   PROT 6.0 (L) 12/29/2020   ALBUMIN 4.0 02/03/2016   CALCIUM 9.4 12/29/2020   EGFR 71 12/29/2020   Lab Results  Component Value Date   CHOL 157 09/16/2020   Lab Results  Component Value Date   HDL 66 09/16/2020   Lab Results  Component Value Date   LDLCALC 77 09/16/2020   Lab Results  Component Value Date   TRIG 60 09/16/2020   Lab Results  Component Value Date   CHOLHDL 2.4 09/16/2020   Lab Results  Component Value Date   HGBA1C 5.3 09/16/2020      Assessment & Plan:   Problem List Items Addressed This Visit       Cardiovascular and Mediastinum   Essential hypertension (Chronic)    Well controlled. Continue current regimen. Follow up in  6 mo       Chronic heart failure with preserved ejection fraction ([place]    She does have a history of heart failure her weight is up about 8 pounds she does not have any significant peripheral edema on exam but I think it is reasonable for her to take an extra Lasix for the next 3 days to see if she notices any improvement.  I do think she could also have some weight gain with this as she did have back surgery and is really struggling to recover.      Other Visit Diagnoses     Colon cancer screening    -  Primary   Relevant Orders   Cologuard   Elevated liver  enzymes       Relevant Orders   COMPLETE METABOLIC PANEL WITH GFR       Did encourage her to call back and let u[place]ore[place]now where we might be would refer her for physical therapy since [facility].  She wants to reach out to her therapist that she had seen there previously to see if she had any recommendations.  She will call me back if she needs a new referral.  Evaded liver enzymes-plan to recheck enzymes.  No orders of the defined types were placed in this encounter.   Follow-up: Return in about 4 months (around 06/29/2021).    CBeatrice Lecher MD

## 2021-03-01 NOTE — Assessment & Plan Note (Signed)
She does have a history of heart failure her weight is up about 8 pounds she does not have any significant peripheral edema on exam but I think it is reasonable for her to take an extra Lasix for the next 3 days to see if she notices any improvement.  I do think she could also have some weight gain with this as she did have back surgery and is really struggling to recover.

## 2021-03-01 NOTE — Patient Instructions (Signed)
Ok to take an extra lasix in the  afternoon for the last 3 days.

## 2021-03-02 LAB — COMPLETE METABOLIC PANEL WITH GFR
AG Ratio: 1.7 (calc) (ref 1.0–2.5)
ALT: 10 U/L (ref 6–29)
AST: 11 U/L (ref 10–35)
Albumin: 3.8 g/dL (ref 3.6–5.1)
Alkaline phosphatase (APISO): 64 U/L (ref 37–153)
BUN: 12 mg/dL (ref 7–25)
CO2: 31 mmol/L (ref 20–32)
Calcium: 9.3 mg/dL (ref 8.6–10.4)
Chloride: 96 mmol/L — ABNORMAL LOW (ref 98–110)
Creat: 0.74 mg/dL (ref 0.60–1.00)
Globulin: 2.2 g/dL (calc) (ref 1.9–3.7)
Glucose, Bld: 75 mg/dL (ref 65–99)
Potassium: 4.9 mmol/L (ref 3.5–5.3)
Sodium: 133 mmol/L — ABNORMAL LOW (ref 135–146)
Total Bilirubin: 0.5 mg/dL (ref 0.2–1.2)
Total Protein: 6 g/dL — ABNORMAL LOW (ref 6.1–8.1)
eGFR: 86 mL/min/{1.73_m2} (ref >=60)

## 2021-03-02 NOTE — Progress Notes (Signed)
Donna Sexton, sodium still little borderline low which is your normal.  Protein levels are still little borderline low so just encourage you to continue to work on increasing protein in your diet this can be obtained through things like nuts, soy, beans like pin toes etc., and meat.  Your liver enzyme is back down to normal which is great.  If you do not receive your Cologuard kit  in the mail within a week please let us know.

## 2021-03-05 ENCOUNTER — Ambulatory Visit: Payer: Medicare Other | Admitting: Sports Medicine

## 2021-03-15 DIAGNOSIS — Z9889 Other specified postprocedural states: Secondary | ICD-10-CM | POA: Diagnosis not present

## 2021-03-15 DIAGNOSIS — M4722 Other spondylosis with radiculopathy, cervical region: Secondary | ICD-10-CM | POA: Diagnosis not present

## 2021-03-15 DIAGNOSIS — M4312 Spondylolisthesis, cervical region: Secondary | ICD-10-CM | POA: Diagnosis not present

## 2021-03-15 DIAGNOSIS — M5412 Radiculopathy, cervical region: Secondary | ICD-10-CM | POA: Diagnosis not present

## 2021-03-18 ENCOUNTER — Other Ambulatory Visit: Payer: Self-pay | Admitting: Family Medicine

## 2021-03-18 DIAGNOSIS — I10 Essential (primary) hypertension: Secondary | ICD-10-CM

## 2021-03-23 ENCOUNTER — Encounter: Payer: Self-pay | Admitting: Family Medicine

## 2021-03-24 ENCOUNTER — Other Ambulatory Visit: Payer: Self-pay

## 2021-03-24 ENCOUNTER — Other Ambulatory Visit: Payer: Self-pay | Admitting: Family Medicine

## 2021-03-24 MED ORDER — DIPHENOXYLATE-ATROPINE 2.5-0.025 MG PO TABS: ORAL_TABLET | ORAL | 0 refills | Status: DC

## 2021-03-24 NOTE — Telephone Encounter (Signed)
Okay to put back on med list.  I do not think it was taken off for a reason.  It could have been marked not currently taking and we just removed it.  But there was not a reason that we were stopping it or holding it. ?

## 2021-03-29 DIAGNOSIS — M5412 Radiculopathy, cervical region: Secondary | ICD-10-CM | POA: Diagnosis not present

## 2021-03-29 DIAGNOSIS — M47812 Spondylosis without myelopathy or radiculopathy, cervical region: Secondary | ICD-10-CM | POA: Diagnosis not present

## 2021-03-29 DIAGNOSIS — M4802 Spinal stenosis, cervical region: Secondary | ICD-10-CM | POA: Diagnosis not present

## 2021-03-29 DIAGNOSIS — Z885 Allergy status to narcotic agent status: Secondary | ICD-10-CM | POA: Diagnosis not present

## 2021-03-30 ENCOUNTER — Other Ambulatory Visit: Payer: Self-pay | Admitting: Family Medicine

## 2021-03-30 DIAGNOSIS — I110 Hypertensive heart disease with heart failure: Secondary | ICD-10-CM | POA: Diagnosis not present

## 2021-03-30 DIAGNOSIS — I5030 Unspecified diastolic (congestive) heart failure: Secondary | ICD-10-CM | POA: Diagnosis not present

## 2021-03-30 DIAGNOSIS — E785 Hyperlipidemia, unspecified: Secondary | ICD-10-CM | POA: Diagnosis not present

## 2021-03-30 DIAGNOSIS — R809 Proteinuria, unspecified: Secondary | ICD-10-CM | POA: Diagnosis not present

## 2021-03-30 DIAGNOSIS — I10 Essential (primary) hypertension: Secondary | ICD-10-CM | POA: Diagnosis not present

## 2021-03-30 DIAGNOSIS — E871 Hypo-osmolality and hyponatremia: Secondary | ICD-10-CM | POA: Diagnosis not present

## 2021-03-30 DIAGNOSIS — Z79899 Other long term (current) drug therapy: Secondary | ICD-10-CM | POA: Diagnosis not present

## 2021-03-30 DIAGNOSIS — R7303 Prediabetes: Secondary | ICD-10-CM | POA: Diagnosis not present

## 2021-03-30 DIAGNOSIS — Z9181 History of falling: Secondary | ICD-10-CM | POA: Diagnosis not present

## 2021-03-30 NOTE — Telephone Encounter (Signed)
This prescription was already just sent 6 days ago.  Not sure why the pharmacy is requesting more? ?

## 2021-03-30 NOTE — Telephone Encounter (Signed)
Meds ordered this encounter  ?Medications  ? diphenoxylate-atropine (LOMOTIL) 2.5-0.025 MG tablet  ?  Sig: TAKE 1 TABLET FOUR TIMES DAILY AS NEEDED FOR DIARRHEA/LOOSE STOOLS.  ?  Dispense:  30 tablet  ?  Refill:  0  ? ? ?

## 2021-04-05 ENCOUNTER — Encounter: Payer: Self-pay | Admitting: Sports Medicine

## 2021-04-05 DIAGNOSIS — M170 Bilateral primary osteoarthritis of knee: Secondary | ICD-10-CM

## 2021-04-06 MED ORDER — TRAMADOL HCL 50 MG PO TABS: 50.0000 mg | ORAL_TABLET | Freq: Three times a day (TID) | ORAL | 0 refills | Status: DC | PRN

## 2021-04-07 DIAGNOSIS — Z961 Presence of intraocular lens: Secondary | ICD-10-CM | POA: Diagnosis not present

## 2021-04-11 ENCOUNTER — Encounter: Payer: Self-pay | Admitting: Sports Medicine

## 2021-04-12 ENCOUNTER — Telehealth: Payer: Self-pay

## 2021-04-12 NOTE — Telephone Encounter (Signed)
Patient sent in request for epidural in her low back. She had a lumbar spine mri  done back in October.  ? ?Thanks  ?

## 2021-04-12 NOTE — Telephone Encounter (Signed)
Left msg for patient to call back to schedule an appt to discuss treatment plan.  ? ?Please call patient to schedule with T for discussion of MRI and treatment plan.  ?

## 2021-04-12 NOTE — Telephone Encounter (Signed)
I have never evaluated this patient for her low back, so I would not know whether to order epidurals, facets, sacroiliac joint.  I would need at least a telephone visit if not an in person visit to determine what the best structure to be injected. ?

## 2021-04-13 ENCOUNTER — Other Ambulatory Visit: Payer: Self-pay | Admitting: Psychiatry

## 2021-04-13 NOTE — Telephone Encounter (Signed)
V.Mail left for patient to call and schedule appt. ?

## 2021-04-14 ENCOUNTER — Other Ambulatory Visit: Payer: Self-pay | Admitting: Sports Medicine

## 2021-04-14 DIAGNOSIS — M170 Bilateral primary osteoarthritis of knee: Secondary | ICD-10-CM

## 2021-04-14 NOTE — Telephone Encounter (Signed)
Appt scheduled

## 2021-04-20 ENCOUNTER — Ambulatory Visit: Payer: Medicare Other | Admitting: Family Medicine

## 2021-04-20 NOTE — Telephone Encounter (Signed)
Patient called and asked about her RF of Depakote. Made May 25 ?

## 2021-04-21 ENCOUNTER — Encounter: Payer: Self-pay | Admitting: Sports Medicine

## 2021-04-26 ENCOUNTER — Other Ambulatory Visit: Payer: Self-pay | Admitting: Sports Medicine

## 2021-04-26 DIAGNOSIS — M170 Bilateral primary osteoarthritis of knee: Secondary | ICD-10-CM

## 2021-05-03 ENCOUNTER — Ambulatory Visit (INDEPENDENT_AMBULATORY_CARE_PROVIDER_SITE_OTHER): Payer: Medicare Other

## 2021-05-03 ENCOUNTER — Ambulatory Visit (INDEPENDENT_AMBULATORY_CARE_PROVIDER_SITE_OTHER): Payer: Medicare Other | Admitting: Sports Medicine

## 2021-05-03 DIAGNOSIS — M170 Bilateral primary osteoarthritis of knee: Secondary | ICD-10-CM

## 2021-05-03 NOTE — Progress Notes (Signed)
? ? ?  Procedures performed today:   ? ?Procedure: Real-time Ultrasound Guided injection of the left knee ?Device: Samsung HS60  ?Verbal informed consent obtained.  ?Time-out conducted.  ?Noted no overlying erythema, induration, or other signs of local infection.  ?Skin prepped in a sterile fashion.  ?Local anesthesia: Topical Ethyl chloride.  ?With sterile technique and under real time ultrasound guidance: Noted moderate effusion, 1 cc Kenalog 40, 2 cc lidocaine, 2 cc bupivacaine injected easily ?Completed without difficulty  ?Advised to call if fevers/chills, erythema, induration, drainage, or persistent bleeding.  ?Images permanently stored and available for review in PACS.  ?Impression: Technically successful ultrasound guided injection. ? ?Procedure: Real-time Ultrasound Guided injection of the right knee ?Device: Samsung HS60  ?Verbal informed consent obtained.  ?Time-out conducted.  ?Noted no overlying erythema, induration, or other signs of local infection.  ?Skin prepped in a sterile fashion.  ?Local anesthesia: Topical Ethyl chloride.  ?With sterile technique and under real time ultrasound guidance: Noted mild effusion, 1 cc Kenalog 40, 2 cc lidocaine, 2 cc bupivacaine injected easily ?Completed without difficulty  ?Advised to call if fevers/chills, erythema, induration, drainage, or persistent bleeding.  ?Images permanently stored and available for review in PACS.  ?Impression: Technically successful ultrasound guided injection. ? ?Independent interpretation of notes and tests performed by another provider:  ? ?None. ? ?Brief History, Exam, Impression, and Recommendations:   ? ?Primary osteoarthritis of both knees ?Pleasant 71 year old female, history of morbid obesity and bilateral knee osteoarthritis, we treated her back in 2019 with a series of Orthovisc, she did go to flex agenic's and got an injection which did not seem to help, we did bilateral steroid injections back in January, repeated again  today. ?She understands she is not a candidate for knee replacement unless nonsurgical treatment fails. ? ?Chronic process with exacerbation and pharmacologic intervention. ? ?___________________________________________ ?Ihor Austin. Benjamin Stain, M.D., ABFM., CAQSM. ?Primary Care and Sports Medicine ?[facility] ? ?Adjunct Instructor of Family Medicine  ?[employer] ?

## 2021-05-03 NOTE — Assessment & Plan Note (Signed)
Pleasant 71 year old female, history of morbid obesity and bilateral knee osteoarthritis, we treated her back in 2019 with a series of Orthovisc, she did go to flex ag[facility] and got an injection which did not seem to help, we did bilateral steroid injections back in January, repeated again today. ?She understands she is not a candidate for knee replacement unless nonsurgical treatment fails. ?

## 2021-05-17 ENCOUNTER — Other Ambulatory Visit: Payer: Self-pay | Admitting: Family Medicine

## 2021-05-18 ENCOUNTER — Encounter: Payer: Self-pay | Admitting: Family Medicine

## 2021-05-18 ENCOUNTER — Ambulatory Visit (INDEPENDENT_AMBULATORY_CARE_PROVIDER_SITE_OTHER): Payer: Medicare Other | Admitting: Family Medicine

## 2021-05-18 VITALS — BP 114/58 | HR 85 | Resp 18 | Ht 64.0 in | Wt 238.0 lb

## 2021-05-18 DIAGNOSIS — Z1211 Encounter for screening for malignant neoplasm of colon: Secondary | ICD-10-CM

## 2021-05-18 DIAGNOSIS — L821 Other seborrheic keratosis: Secondary | ICD-10-CM

## 2021-05-18 DIAGNOSIS — L081 Erythrasma: Secondary | ICD-10-CM

## 2021-05-18 DIAGNOSIS — R21 Rash and other nonspecific skin eruption: Secondary | ICD-10-CM

## 2021-05-18 MED ORDER — BENZOYL PEROXIDE WASH 5 % EX LIQD
Freq: Two times a day (BID) | CUTANEOUS | 12 refills | Status: DC
Start: 2021-05-18 — End: 2023-04-13

## 2021-05-18 MED ORDER — FLUCONAZOLE 150 MG PO TABS: 150.0000 mg | ORAL_TABLET | ORAL | 0 refills | Status: DC

## 2021-05-18 MED ORDER — CLINDAMYCIN PHOSPHATE 1 % EX GEL: Freq: Two times a day (BID) | CUTANEOUS | 0 refills | Status: DC

## 2021-05-18 NOTE — Progress Notes (Signed)
? ?  Established Patient Office Visit ? ?Subjective   ?Patient ID: Donna Sexton, female    DOB: [birth year]  Age: 71 y.o. MRN: [id number] ? ?Chief Complaint  ?Patient presents with  ? Itching  ?  Rash/itching in Groin area since 12/2021. Patient stated she is using Nystatin Powder and Cream with little improvement.   ? mole  ?  On neck, 2 months, itches   ? ? ?HPI ?Rash/itching in Groin area since 12/2021. Started while she was in the hospital for surgery.  Patient stated she is using Nystatin Powder and Cream with little improvement.  She recently tried some Monistat and says it helps with the itch but does not improve the rash. ? ?Mole - On anterior  neck, 2 months, itches.  She was not able to get in with her dermatologist so wanted to make sure that it looked okay. ? ? ? ?ROS ? ?  ?Objective:  ?  ? ?BP (!) 114/58   Pulse 85   Resp 18   Ht 5\' 4"  (1.626 m)   Wt 238 lb (108 kg)   SpO2 95%   BMI 40.85 kg/m?  ? ? ?Physical Exam ?Vitals reviewed.  ?Constitutional:   ?   Appearance: She is well-developed.  ?HENT:  ?   Head: Normocephalic and atraumatic.  ?Eyes:  ?   Conjunctiva/sclera: Conjunctivae normal.  ?Cardiovascular:  ?   Rate and Rhythm: Normal rate.  ?Pulmonary:  ?   Effort: Pulmonary effort is normal.  ?Skin: ?   General: Skin is dry.  ?   Coloration: Skin is not pale.  ?   Comments: Skin along the groin crease has an erythematous border and some clearing and then some erythema and cracking and peeling of the skin at the crease.  Does have an odor.  ?Neurological:  ?   Mental Status: She is alert and oriented to person, place, and time.  ?Psychiatric:     ?   Behavior: Behavior normal.  ? ? ? ?No results found for any visits on 05/18/21. ? ? ? ?The 10-year ASCVD risk score (Arnett DK, et al., 2019) is: 11.6% ? ?  ?Assessment & Plan:  ? ?Problem List Items Addressed This Visit   ?None ?Visit Diagnoses   ? ? Colon cancer screening    -  Primary  ? Relevant Orders  ? Cologuard  ? Rash of groin      ?  Relevant Medications  ? fluconazole (DIFLUCAN) 150 MG tablet  ? benzoyl peroxide 5 % external liquid  ? clindamycin (CLINDAGEL) 1 % gel  ? Erythrasma      ? Relevant Medications  ? fluconazole (DIFLUCAN) 150 MG tablet  ? benzoyl peroxide 5 % external liquid  ? clindamycin (CLINDAGEL) 1 % gel  ? Seborrheic keratoses      ? ?  ? ? ?Groin rash-possible erythrasma based on appearance.  We will treat with benzyl peroxide wash topical clindamycin gel and Diflucan every other day for 3 tabs.  If rash is not completely gone in 2 weeks then please let me know. ? ?Seborrheic keratosis-lesion on anterior lower neck near the supraclavicular navicular notch is most consistent with a seborrheic keratosis and is a benign lesion. ? ?Return if symptoms worsen or fail to improve.  ? ? ?2020, MD ? ?

## 2021-06-03 ENCOUNTER — Encounter: Payer: Self-pay | Admitting: Psychiatry

## 2021-06-03 ENCOUNTER — Telehealth (INDEPENDENT_AMBULATORY_CARE_PROVIDER_SITE_OTHER): Payer: Medicare Other | Admitting: Psychiatry

## 2021-06-03 DIAGNOSIS — F411 Generalized anxiety disorder: Secondary | ICD-10-CM | POA: Diagnosis not present

## 2021-06-03 DIAGNOSIS — F319 Bipolar disorder, unspecified: Secondary | ICD-10-CM

## 2021-06-03 DIAGNOSIS — F1311 Sedative, hypnotic or anxiolytic abuse, in remission: Secondary | ICD-10-CM | POA: Diagnosis not present

## 2021-06-03 DIAGNOSIS — F5105 Insomnia due to other mental disorder: Secondary | ICD-10-CM

## 2021-06-03 MED ORDER — DOXEPIN HCL 25 MG PO CAPS: 25.0000 mg | ORAL_CAPSULE | Freq: Every day | ORAL | 3 refills | Status: DC

## 2021-06-03 MED ORDER — DIVALPROEX SODIUM ER 500 MG PO TB24: 1500.0000 mg | ORAL_TABLET | Freq: Every day | ORAL | 3 refills | Status: DC

## 2021-06-03 NOTE — Progress Notes (Signed)
Donna Sexton [id number] [birth year] 71 y.o.  Video Visit via My Chart  I connected with pt by My Chart and verified that I am speaking with the correct person using two identifiers.   I discussed the limitations, risks, security and privacy concerns of performing an evaluation and management service by My Chart  and the availability of in person appointments. I also discussed with the patient that there may be a patient responsible charge related to this service. The patient expressed understanding and agreed to proceed.  I discussed the assessment and treatment plan with the patient. The patient was provided an opportunity to ask questions and all were answered. The patient agreed with the plan and demonstrated an understanding of the instructions.   The patient was advised to call back or seek an in-person evaluation if the symptoms worsen or if the condition fails to improve as anticipated.  I provided 30 minutes of video time during this encounter.  The patient was located at home and the provider was located office. Session started at 130 and ended at 2 PM  Subjective:   Patient ID:  Donna Sexton is a 71 y.o. (DOB [birth year]) female.  Chief Complaint:  Chief Complaint  Patient presents with   Follow-up    HPI Donna Sexton presents to the office today for follow-up of bipolar disorder and anxiety and a history of occasional compliance problems.  seen October 2020.  No meds were changed.  She was maintained on Depakote 1500 HS.  01/20/2020 appt with following noted:  Changed to Dr.  Reinaldo Meeker at [facility].  Seen her 3 times and hard to get into see her and may have to change again. Trying to lose weight and lost 17#. Sometimes nearly drops things or double types and wonders if she could have TD. Vaccinated and boostered Function is ok.  Sleep pretty good unless stressed.  Patient reports stable mood and denies depressed or irritable moods except as indicated.   Patient  denies difficulty with sleep initiation or maintenance. Denies appetite disturbance.  Patient reports that energy and motivation have been good.  Patient denies any difficulty with concentration.  Patient denies any suicidal ideation. No paranoia.  06/03/2021 appointment with the following noted: Hanging on.  Became a great grandmother.  She is doing well.   Worries VPA causes weight gain.   Still on Depakote ER 1500 HS and doxepin 25 mg hs. Sleep is OK but could be better. Medical status is OK. Main thing is arthritis in back and knees. No major mood swings but can get down from others being negative.  She fights being negative and is pretty successful at keeping depression at bay.     Past psychiatric medications:  Geodon, CBZ, risperidone, Zyprexa, Abilify,  perphenazine, loxapine,  lithium 900,  Topamax, gabapentin,lamotrigine, Depakote 1500 sertraline, duloxetine,  citalopram, venlafaxine, Wellbutrin, ,  buspirone GI SE,  hydroxyzine, Prosom, lorazepam, temazepam, trazodone. History of BZ dependence remotely.  Review of Systems:  Review of Systems  Respiratory:  Positive for shortness of breath.   Musculoskeletal:  Positive for arthralgias and back pain.  Neurological:  Negative for tremors and weakness.  Psychiatric/Behavioral:  Negative for agitation, behavioral problems, confusion, decreased concentration, dysphoric mood, hallucinations, self-injury, sleep disturbance and suicidal ideas. The patient is not nervous/anxious and is not hyperactive.    Medications: I have reviewed the patient's current medications.  Current Outpatient Medications  Medication Sig Dispense Refill   amLODipine (NORVASC) 2.5 MG tablet TAKE ONE  TABLET BY MOUTH BY MOUTH DAILY 30 tablet 3   atorvastatin (LIPITOR) 40 MG tablet Take 1 tablet (40 mg total) by mouth daily. 90 tablet 3   benzoyl peroxide 5 % external liquid Apply topically 2 (two) times daily. Please help her find over-the-counter if not  covered by the insurance.  If not available please let us know we can see if the insurance will cover BenzaClin topical. 142 g 12   clindamycin (CLINDAGEL) 1 % gel Apply topically 2 (two) times daily. X 10 days 30 g 0   diphenoxylate-atropine (LOMOTIL) 2.5-0.025 MG tablet TAKE 1 TABLET FOUR TIMES DAILY AS NEEDED FOR DIARRHEA/LOOSE STOOLS. 30 tablet 0   divalproex (DEPAKOTE ER) 500 MG 24 hr tablet TAKE 3 TABLETS AT BEDTIME. 180 tablet 0   doxepin (SINEQUAN) 25 MG capsule TAKE ONE CAPSULE BY MOUTH EVERY MORNING 90 capsule 0   fluconazole (DIFLUCAN) 150 MG tablet Take 1 tablet (150 mg total) by mouth every other day. 3 tablet 0   furosemide (LASIX) 40 MG tablet Take 40 mg by mouth daily.     labetalol (NORMODYNE) 200 MG tablet 1.5 PO in AM and 1 in PM 225 tablet 1   oxybutynin (DITROPAN) 5 MG tablet TAKE ONE TABLET BY MOUTH TWICE DAILY 180 tablet 0   pregabalin (LYRICA) 50 MG capsule TAKE ONE CAPSULE BY MOUTH THREE TIMES DAILY 90 capsule 2   promethazine (PHENERGAN) 25 MG tablet TAKE ONE TABLET BY MOUTH EVERY 8 HOURS AS NEEDED FOR NAUSEA OR FOR VOMITING 90 tablet 0   No current facility-administered medications for this visit.    Medication Side Effects: None  Allergies:  Allergies  Allergen Reactions   Morphine Nausea And Vomiting    Other reaction(s): Other (See Comments) Unspecified reaction    Other Other (See Comments)    Allergic to muscle relaxers -unknown reaction Flexeril only can take cyclobenzaprine.    Past Medical History:  Diagnosis Date   ANEMIA 11/12/2007   Qualifier: Diagnosis of  By: Briscoe Burns CMA, Alvy Beal     Anxiety    Arthritis    ASTHMA NOS W/ACUTE EXACERBATION 01/13/2010   Qualifier: Diagnosis of  By: Yetta Barre MD, Bernadene Bell.    BIPOLAR DISORDER UNSPECIFIED 10/29/2007   Qualifier: Diagnosis of  By: Yetta Barre MD, Bernadene Bell.    COMMON MIGRAINE 12/29/2008   Qualifier: Diagnosis of  By: Yetta Barre MD, Bernadene Bell.    GERD 07/28/2009   Qualifier: Diagnosis of  By: Felicity Coyer MD, Raenette Rover    HYPERLIPIDEMIA 10/29/2007   Qualifier: Diagnosis of  By: Yetta Barre MD, Bernadene Bell.    HYPERTENSION, BENIGN ESSENTIAL 10/29/2007   Qualifier: Diagnosis of  By: Yetta Barre MD, Bernadene Bell.    OSTEOPENIA 10/30/2007   Qualifier: Diagnosis of  By: Yetta Barre MD, Bernadene Bell.    OVERACTIVE BLADDER 02/21/2008   Qualifier: Diagnosis of  By: Jonny Ruiz MD, Len Blalock    SLEEP APNEA 12/31/2009   Qualifier: Diagnosis of  By: Yetta Barre MD, Bernadene Bell.     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   Please see review of systems for further details on the patient's review from today.   Objective:   Physical Exam:  There were no vitals taken for this visit.  Physical Exam Neurological:     Mental Status: She is alert and  oriented to person, place, and time.     Cranial Nerves: No dysarthria.  Psychiatric:        Attention and Perception: Attention normal.        Mood and Affect: Mood is not anxious or depressed. Affect is not tearful.        Behavior: Behavior is cooperative.        Thought Content: Thought content normal. Thought content is not paranoid or delusional. Thought content does not include homicidal or suicidal ideation. Thought content does not include suicidal plan.        Cognition and Memory: Cognition and memory normal.     Comments: Fair insight and judgment. Affect upbeat   Lab Review:     Component Value Date/Time   NA 133 (L) 03/01/2021 0000   K 4.9 03/01/2021 0000   CL 96 (L) 03/01/2021 0000   CO2 31 03/01/2021 0000   GLUCOSE 75 03/01/2021 0000   BUN 12 03/01/2021 0000   CREATININE 0.74 03/01/2021 0000   CALCIUM 9.3 03/01/2021 0000   PROT 6.0 (L) 03/01/2021 0000   ALBUMIN 4.0 02/03/2016 0819   AST 11 03/01/2021 0000   ALT 10 03/01/2021 0000   ALKPHOS 52 02/03/2016 0819   BILITOT 0.5 03/01/2021 0000   GFRNONAA 91 08/18/2017 0935   GFRAA 106 08/18/2017 0935       Component Value Date/Time   WBC 5.5 12/29/2020 0000   RBC 3.67 (L) 12/29/2020 0000   HGB 11.6 (L) 12/29/2020 0000   HCT 33.6 (L) 12/29/2020 0000   PLT 272 12/29/2020 0000   MCV 91.6 12/29/2020 0000   MCV 94.2 06/06/2011 1113   MCH 31.6 12/29/2020 0000   MCHC 34.5 12/29/2020 0000   RDW 11.9 12/29/2020 0000   LYMPHSABS 897 12/29/2020 0000   MONOABS 637 02/03/2016 0819   EOSABS 429 12/29/2020 0000   BASOSABS 77 12/29/2020 0000    No results found for: POCLITH, LITHIUM   Lab Results  Component Value Date   VALPROATE 75.9 06/15/2015   CBMZ <0.3 (L) 02/01/2010     .res Assessment: Plan:    Bipolar I disorder (HCC)  Generalized anxiety disorder  Benzodiazepine abuse in remission (HCC)  As noted patient has a long history of mood disorder and anxiety with multiple meds tried.  Depakote has been  the most effective mood stabilizer and she has the best response at 1500 mg a day.  She is tolerating it well.  Her anxiety is manageable at this time and better than last time..  She has had compliance issues at times in the past but not in the last couple of years. Disc possible VPA related tremor and it's difference from TD in response to questions.  No AIM noted by video. Disc risk VPA causing weight gain but it is medically necessary and best tolerated and safest of affordable options.  There is no evidence of benzodiazepine abuse or dependence at this time.  Been maintained.  Patient has committed to sobriety from benzodiazepines.  This is encouraged.  Low dose doxepin unlikely to cause mood swings.  No evidence of liver problems from the Depakote.  Disc risk TCA triggering mania but dose of doxepin is los for sleep and doing well.  Supportive therapy and boundary setting discussed in dealing with her drug addicted granddaughter.  This is been a chronic problem. Some new health problems with dx CHF.  No med changes: Continue Depakote ER 1500 mg nightly Continue doxepin 25 mg nightly for sleep  Since mood has been stable we will follow-up in 6 months  Meredith Staggers, MD, DFAPA  Please see After Visit Summary for patient specific instructions.  Future Appointments  Date Time Provider Department Center  06/15/2021 11:30 AM Shamleffer, Konrad Dolores, MD [facility] None    No orders of the defined types were placed in this encounter.     -------------------------------

## 2021-06-15 ENCOUNTER — Encounter: Payer: Self-pay | Admitting: Internal Medicine

## 2021-06-15 ENCOUNTER — Ambulatory Visit (INDEPENDENT_AMBULATORY_CARE_PROVIDER_SITE_OTHER): Payer: Medicare Other | Admitting: Internal Medicine

## 2021-06-15 VITALS — BP 104/76 | HR 69 | Ht 64.0 in | Wt 241.0 lb

## 2021-06-15 DIAGNOSIS — E871 Hypo-osmolality and hyponatremia: Secondary | ICD-10-CM | POA: Diagnosis not present

## 2021-06-15 LAB — BASIC METABOLIC PANEL
BUN: 12 mg/dL (ref 6–23)
CO2: 29 mEq/L (ref 19–32)
Calcium: 9.9 mg/dL (ref 8.4–10.5)
Chloride: 98 mEq/L (ref 96–112)
Creatinine, Ser: 0.66 mg/dL (ref 0.40–1.20)
GFR: 87.76 mL/min (ref >60.00)
Glucose, Bld: 95 mg/dL (ref 70–99)
Potassium: 4.6 mEq/L (ref 3.5–5.1)
Sodium: 134 mEq/L — ABNORMAL LOW (ref 135–145)

## 2021-06-15 LAB — T4, FREE: Free T4: 0.67 ng/dL (ref 0.60–1.60)

## 2021-06-15 LAB — CORTISOL: Cortisol, Plasma: 6.9 ug/dL

## 2021-06-15 LAB — TSH: TSH: 2.41 u[IU]/mL (ref 0.35–5.50)

## 2021-06-15 NOTE — Patient Instructions (Signed)
Limit liquid intake to 60  ounce for now  Use chewing gum, biotin mouth wash and chap sticks for dry mouth/lips    Please discuss with psychiatry regarding switching to Depakote as this could make sodium worse

## 2021-06-15 NOTE — Progress Notes (Signed)
Name: Donna Sexton  MRN/ DOB: [id number], June 16, 1950    Age/ Sex: 71 y.o., female    PCP: Agapito Games, MD   Reason for Endocrinology Evaluation: Hyponatremia      Date of Initial Endocrinology Evaluation: 06/15/2021     HPI: Donna Sexton is a 71 y.o. female with a past medical history of HTN, CHF . The patient presented for initial endocrinology clinic visit on 06/15/2021 for consultative assistance with her Hyponatremia .   She has been noted with low sodium intermittently since 2010.  Of note, she is on Furosemide   She  does have LE edema and takes furosemide  Follows with Dr. Marny Lowenstein for cardiology  She is currently on fluid restriction but she is having issues controlling this  She drinks water to cool herself down   She has dizziness which is positional with rapid standing.  She has early morning nausea but no vomiting   Has occasional headaches that she attributes to allergies  No recent changes in vision  Has occasional heartburn , uses OTC Anatacid   She gets intra-articular injections to the knees ,last one was last month   HISTORY:  Past Medical History:  Past Medical History:  Diagnosis Date   ANEMIA 11/12/2007   Qualifier: Diagnosis of  By: Briscoe Burns CMA, Bárbara.Apley     Anxiety    Arthritis    ASTHMA NOS W/ACUTE EXACERBATION 01/13/2010   Qualifier: Diagnosis of  By: Yetta Barre MD, Bernadene Bell.    BIPOLAR DISORDER UNSPECIFIED 10/29/2007   Qualifier: Diagnosis of  By: Yetta Barre MD, Bernadene Bell.    COMMON MIGRAINE 12/29/2008   Qualifier: Diagnosis of  By: Yetta Barre MD, Bernadene Bell.    GERD 07/28/2009   Qualifier: Diagnosis of  By: Felicity Coyer MD, Raenette Rover    HYPERLIPIDEMIA 10/29/2007   Qualifier: Diagnosis of  By: Yetta Barre MD, Bernadene Bell.    HYPERTENSION, BENIGN ESSENTIAL 10/29/2007   Qualifier: Diagnosis of  By: Yetta Barre MD, Bernadene Bell.    OSTEOPENIA 10/30/2007   Qualifier: Diagnosis of  By: Yetta Barre MD, Bernadene Bell.    OVERACTIVE BLADDER 02/21/2008   Qualifier: Diagnosis of  By: Jonny Ruiz  MD, Len Blalock    SLEEP APNEA 12/31/2009   Qualifier: Diagnosis of  By: Yetta Barre MD, Bernadene Bell.    Past Surgical History:  Past Surgical History:  Procedure Laterality Date   ABDOMINAL HYSTERECTOMY     BREAST IMPLANT REMOVAL Bilateral 2022   BREAST SURGERY  01/10/1978   implants bilateral   CARPAL TUNNEL RELEASE     CHOLECYSTECTOMY     HERNIA REPAIR     PAROTID ENDOSCOPY     TUBAL LIGATION      Social History:  reports that she has never smoked. She has never used smokeless tobacco. She reports that she does not drink alcohol and does not use drugs. Family History: family history includes Alcohol abuse in her maternal uncle; Diabetes in her father; Heart attack in her father and mother; Heart failure in her brother and mother; Hypertension in her father and mother.   HOME MEDICATIONS: Allergies as of 06/15/2021       Reactions   Morphine Nausea And Vomiting   Other reaction(s): Other (See Comments) Unspecified reaction   Other Other (See Comments)   Allergic to muscle relaxers -unknown reaction Flexeril only can take cyclobenzaprine.        Medication List        Accurate as of June 15, 2021  7:32  AM. If you have any questions, ask your nurse or doctor.          amLODipine 2.5 MG tablet Commonly known as: NORVASC TAKE ONE TABLET BY MOUTH BY MOUTH DAILY   atorvastatin 40 MG tablet Commonly known as: LIPITOR Take 1 tablet (40 mg total) by mouth daily.   benzoyl peroxide 5 % external liquid Generic drug: benzoyl peroxide Apply topically 2 (two) times daily. Please help her find over-the-counter if not covered by the insurance.  If not available please let us know we can see if the insurance will cover BenzaClin topical.   clindamycin 1 % gel Commonly known as: Clindagel Apply topically 2 (two) times daily. X 10 days   diphenoxylate-atropine 2.5-0.025 MG tablet Commonly known as: LOMOTIL TAKE 1 TABLET FOUR TIMES DAILY AS NEEDED FOR DIARRHEA/LOOSE STOOLS.    divalproex 500 MG 24 hr tablet Commonly known as: DEPAKOTE ER Take 3 tablets (1,500 mg total) by mouth at bedtime.   doxepin 25 MG capsule Commonly known as: SINEQUAN Take 1 capsule (25 mg total) by mouth at bedtime.   fluconazole 150 MG tablet Commonly known as: DIFLUCAN Take 1 tablet (150 mg total) by mouth every other day.   furosemide 40 MG tablet Commonly known as: LASIX Take 40 mg by mouth daily.   labetalol 200 MG tablet Commonly known as: NORMODYNE 1.5 PO in AM and 1 in PM   oxybutynin 5 MG tablet Commonly known as: DITROPAN TAKE ONE TABLET BY MOUTH TWICE DAILY   pregabalin 50 MG capsule Commonly known as: LYRICA TAKE ONE CAPSULE BY MOUTH THREE TIMES DAILY   promethazine 25 MG tablet Commonly known as: PHENERGAN TAKE ONE TABLET BY MOUTH EVERY 8 HOURS AS NEEDED FOR NAUSEA OR FOR VOMITING          REVIEW OF SYSTEMS: A comprehensive ROS was conducted with the patient and is negative except as per HPI     OBJECTIVE:  VS: BP 104/76 (BP Location: Left Arm, Patient Position: Sitting, Cuff Size: Normal)   Pulse 69   Ht 5\' 4"  (1.626 m)   Wt 241 lb (109.3 kg)   SpO2 93%   BMI 41.37 kg/m    Wt Readings from Last 3 Encounters:  05/18/21 238 lb (108 kg)  03/01/21 240 lb (108.9 kg)  01/25/21 232 lb 1.6 oz (105.3 kg)     EXAM: General: Pt appears well and is in NAD  Neck: General: Supple without adenopathy. Thyroid: Thyroid size normal.  No goiter or nodules appreciated.   Lungs: Clear with good BS bilat with no rales, rhonchi, or wheezes  Heart: Auscultation: RRR.  Extremities:  BL LE: +1 pretibial edema normal   Mental Status: Judgment, insight: Intact Orientation: Oriented to time, place, and person Mood and affect: No depression, anxiety, or agitation     DATA REVIEWED:     Latest Reference Range & Units 06/15/21 12:30  Sodium 135 - 145 mEq/L 134 (L)  Potassium 3.5 - 5.1 mEq/L 4.6  Chloride 96 - 112 mEq/L 98  CO2 19 - 32 mEq/L 29   Glucose 70 - 99 mg/dL 95  BUN 6 - 23 mg/dL 12  Creatinine 1.610.40 - 0.961.20 mg/dL 0.450.66  Calcium 8.4 - 40.910.5 mg/dL 9.9  GFR >81.19>60.00 mL/min 87.76    Latest Reference Range & Units 06/15/21 12:30  Osmolality 278 - 305 mOsm/kg 282    Latest Reference Range & Units 06/15/21 12:30  Cortisol, Plasma ug/dL 6.9  Glucose 70 - 99 mg/dL 95  TSH 0.35 - 5.50 uIU/mL 2.41  T4,Free(Direct) 0.60 - 1.60 ng/dL 7.10    Latest Reference Range & Units 06/15/21 12:30  Osmolality, Urine 50 - 1,200 mOsm/kg 555  Sodium, Urine 28 - 272 mmol/L 35   ASSESSMENT/PLAN/RECOMMENDATIONS:   Hyponatremia :   -This is hypervolemic hyponatremia secondary to CHF (evidenced by lower extremity edema) -Another contributing factors are the use of Depakote as well as diuretics -The patient has not been able to limit liquid intake which has been set up to 60 ounce a day.  During her visit in the office she was sipping on water, the patient does indicate that she drinks water when she feels warm. -Today we discussed the pathophysiology of hyponatremia and its effect on brain swelling and neurological adverse effects -I did advise the patient to drink to thirst only -I have also advised the patient to discuss with her psychiatrist regarding the effect Depakote on hyponatremia -Hypervolemic  hyponatremia is beyond the scope of endocrinology, and will defer further treatment to her cardiologist by managing CHF/diuretic use -I again emphasized the importance of not consistently drinking water, I have advised her to use ice packs or cold compresses on her neck in case she feels warm rather than drinking water.  She was also advised to use Chapstick's if she has dry lips or chew gum with dry mouth as she tends to drink water when she is not thirsty but to alleviate the above symptoms -Her sodium today is acceptable at 134 mEq/L, goal for serum sodium >130 mEq/L   Follow-up as needed  Signed electronically by: Lyndle Herrlich,  MD  [facility]  [facility] [address] [place] [place], [place] [address] Phone: [phone] FAX: [phone]   CC: Agapito Games, MD [address] [facility] [address] [place] [address] Phone: [phone] Fax: [phone]   Return to Endocrinology clinic as below: Future Appointments  Date Time Provider Department Center  06/15/2021 11:30 AM Nycere Presley, Konrad Dolores, MD [facility] None

## 2021-06-17 ENCOUNTER — Encounter: Payer: Self-pay | Admitting: Family Medicine

## 2021-06-20 LAB — OSMOLALITY, URINE: Osmolality, Ur: 555 mOsm/kg (ref 50–1200)

## 2021-06-20 LAB — OSMOLALITY: Osmolality: 282 mOsm/kg (ref 278–305)

## 2021-06-20 LAB — SODIUM, URINE, RANDOM: Sodium, Ur: 35 mmol/L (ref 28–272)

## 2021-06-20 LAB — ACTH: C206 ACTH: 36 pg/mL (ref 6–50)

## 2021-06-24 ENCOUNTER — Other Ambulatory Visit: Payer: Self-pay | Admitting: Family Medicine

## 2021-06-29 ENCOUNTER — Ambulatory Visit: Payer: Medicare Other | Admitting: Family Medicine

## 2021-07-08 ENCOUNTER — Other Ambulatory Visit: Payer: Self-pay | Admitting: Family Medicine

## 2021-07-12 ENCOUNTER — Other Ambulatory Visit: Payer: Self-pay | Admitting: Family Medicine

## 2021-07-12 DIAGNOSIS — R21 Rash and other nonspecific skin eruption: Secondary | ICD-10-CM

## 2021-07-12 DIAGNOSIS — L081 Erythrasma: Secondary | ICD-10-CM

## 2021-07-22 DIAGNOSIS — M170 Bilateral primary osteoarthritis of knee: Secondary | ICD-10-CM | POA: Diagnosis not present

## 2021-07-27 ENCOUNTER — Other Ambulatory Visit: Payer: Self-pay | Admitting: Family Medicine

## 2021-07-27 DIAGNOSIS — I10 Essential (primary) hypertension: Secondary | ICD-10-CM

## 2021-07-28 ENCOUNTER — Telehealth: Payer: Self-pay | Admitting: Family Medicine

## 2021-07-28 NOTE — Telephone Encounter (Signed)
This isn't just a form. This a a request for preop clearance. She needs to make an appt for this.

## 2021-07-28 NOTE — Telephone Encounter (Signed)
Patients husband dropped off Risk Assessment form to be signed by PCP  - patients husband informed of possible fee and 3-5 day turn around - paperwork placed in providers box - lmr.

## 2021-07-29 NOTE — Telephone Encounter (Signed)
Thank you, I will contact patient

## 2021-07-29 NOTE — Telephone Encounter (Signed)
Patient is scheduled for 08/04/21.

## 2021-08-04 ENCOUNTER — Ambulatory Visit (INDEPENDENT_AMBULATORY_CARE_PROVIDER_SITE_OTHER): Payer: Medicare Other | Admitting: Family Medicine

## 2021-08-04 ENCOUNTER — Encounter: Payer: Self-pay | Admitting: Family Medicine

## 2021-08-04 VITALS — BP 113/65 | HR 52 | Ht 64.0 in | Wt 237.0 lb

## 2021-08-04 DIAGNOSIS — Z01818 Encounter for other preprocedural examination: Secondary | ICD-10-CM | POA: Diagnosis not present

## 2021-08-04 DIAGNOSIS — R112 Nausea with vomiting, unspecified: Secondary | ICD-10-CM | POA: Diagnosis not present

## 2021-08-04 DIAGNOSIS — Z9889 Other specified postprocedural states: Secondary | ICD-10-CM

## 2021-08-04 NOTE — Progress Notes (Signed)
Established Patient Office Visit  Subjective   Patient ID: Donna Sexton, female    DOB: [birth year]  Age: 71 y.o. MRN: [id number]  Chief Complaint  Patient presents with   Pre-op Exam    HPI  Here today for presurgical clearance.  She will have a left total knee replacement [facility] with Dr. Quintella Reichert.  Per the preoperative risk assessment form they will be ordering her labs including CBC BMP and A1c.  But they asked [place] to order any specialty labs and EKG if needed.  She does have chronic hyponatremia followed by Dr. Boneta Lucks.  Recent sodium level was 134.  History of hypertension, chronic heart failure with preserved EF, impaired fasting glucose.  No recent chest pain or breathing difficulty.  She has felt more tired recently and last night she suddenly felt nauseated.  She was able to eat a little bit this morning and feels a little better but still feels pretty tired she did not eat anything unusual.  She definitely feels like her pain in her legs and knees limits her ability to be active.  She is actually scheduled for injections tomorrow which will likely delay her surgery somewhat, but they also want her to get her BMI down to 40 which would be a weight of about 211 or less.  She is already lost 3 pounds.  No recent lower extremity increased swelling.  No urinary symptoms or sign of skin infection.  She does note a history of postoperative nausea from anesthesia.  Patient Active Problem List   Diagnosis Date Noted   DDD (degenerative disc disease), cervical 02/05/2021   Pain in both lower extremities 10/06/2020   Chronic heart failure with preserved ejection fraction (HCC) 05/16/2020   Impaired glucose tolerance 10/10/2019   Acute pain of right shoulder 02/23/2018   GAD (generalized anxiety disorder) 12/20/2017   Trigger finger, left little finger 03/15/2017   Primary osteoarthritis of both knees 01/23/2017   Localized, primary  osteoarthritis of hand, left 01/23/2017   Lumbar radiculitis 09/07/2015   IBS (irritable bowel syndrome) 08/07/2015   Elevated LFTs 05/18/2015   Hyperglycemia 05/12/2015   Lumbago 11/05/2014   Basal cell papilloma 09/11/2014   Dyspnea 09/04/2014   Restless leg 04/16/2014   Muscle spasm 02/27/2014   Overactive bladder 04/09/2013   Abnormal mammogram 06/22/2012   Bipolar disorder (HCC) 09/29/2011   Hyperlipidemia 09/29/2011   Essential hypertension 09/29/2011   Obesity (BMI 30.0-34.9) 09/26/2011   Telogen effluvium 08/16/2011   Calcium blood increased 01/11/2011   Anxiety state 10/04/2010   Clinical depression 10/04/2010   Anal bleeding 10/04/2010   Cannot sleep 10/04/2010   GERD 07/28/2009   COMMON MIGRAINE 12/29/2008   OSTEOPENIA 10/30/2007   Past Surgical History:  Procedure Laterality Date   ABDOMINAL HYSTERECTOMY     BREAST IMPLANT REMOVAL Bilateral 2022   BREAST SURGERY  01/10/1978   implants bilateral   CARPAL TUNNEL RELEASE     CHOLECYSTECTOMY     HERNIA REPAIR     PAROTID ENDOSCOPY     TUBAL LIGATION     Social History   Tobacco Use   Smoking status: Never   Smokeless tobacco: Never  Vaping Use   Vaping Use: Never used  Substance Use Topics   Alcohol use: No   Drug use: No   Allergies  Allergen Reactions   Morphine Nausea And Vomiting    Other reaction(s): Other (See Comments) Unspecified reaction    Other Other (See  Comments)    Allergic to muscle relaxers -unknown reaction Flexeril only can take cyclobenzaprine.      ROS     Objective:     BP 113/65   Pulse (!) 52   Ht 5\' 4"  (1.626 m)   Wt 237 lb (107.5 kg)   SpO2 93%   BMI 40.68 kg/m    Physical Exam Vitals and nursing note reviewed.  Constitutional:      Appearance: She is well-developed.  HENT:     Head: Normocephalic and atraumatic.     Right Ear: Ear canal and external ear normal.     Left Ear: Tympanic membrane, ear canal and external ear normal.     Ears:      Comments: Right TM is blocked by cerumen.    Nose: Nose normal.     Mouth/Throat:     Pharynx: Oropharynx is clear.  Eyes:     Conjunctiva/sclera: Conjunctivae normal.     Pupils: Pupils are equal, round, and reactive to light.  Neck:     Thyroid: No thyromegaly.  Cardiovascular:     Rate and Rhythm: Normal rate and regular rhythm.     Heart sounds: Normal heart sounds.  Pulmonary:     Effort: Pulmonary effort is normal.     Breath sounds: Normal breath sounds. No wheezing.  Abdominal:     General: Bowel sounds are normal.     Palpations: Abdomen is soft.  Musculoskeletal:     Cervical back: Neck supple.  Lymphadenopathy:     Cervical: No cervical adenopathy.  Skin:    General: Skin is warm and dry.  Neurological:     Mental Status: She is alert and oriented to person, place, and time.  Psychiatric:        Mood and Affect: Mood normal.        Behavior: Behavior normal.      No results found for any visits on 08/04/21.    The 10-year ASCVD risk score (Arnett DK, et al., 2019) is: 11.5%    Assessment & Plan:   Problem List Items Addressed This Visit   None Visit Diagnoses     Preop examination    -  Primary   Relevant Orders   EKG 12-Lead   Post-operative nausea and vomiting          She is cleared for surgery.  She is of moderate risk because of her current health concerns but blood pressure is well controlled.  She has not had any recent chest pain or difficulty or problems.  She does understand that she will have to get her weight down for an appropriate BMI to be able to have surgery.  Forms completed.  EKG attached.  EKG today shows rate of 79 bpm, normal sinus rhythm with poor R wave progression in the lateral leads.  Old Q wave in lead III.   Noted no old EKG for comparison.   Return in about 6 months (around 02/04/2022) for Hypertension.    02/06/2022, MD

## 2021-08-04 NOTE — Patient Instructions (Signed)
Commend over-the-counter Debrox drops for your right ear.  Follow the instructions on the box.  You will treat for 4 days and then give the ear rest for 3 days and then repeat that cycle.

## 2021-08-05 DIAGNOSIS — M170 Bilateral primary osteoarthritis of knee: Secondary | ICD-10-CM | POA: Diagnosis not present

## 2021-09-01 ENCOUNTER — Encounter: Payer: Self-pay | Admitting: General Practice

## 2021-09-06 ENCOUNTER — Other Ambulatory Visit: Payer: Self-pay | Admitting: Family Medicine

## 2021-09-08 ENCOUNTER — Encounter: Payer: Self-pay | Admitting: Family Medicine

## 2021-09-20 ENCOUNTER — Ambulatory Visit: Payer: Medicare Other | Admitting: Family Medicine

## 2021-09-28 ENCOUNTER — Encounter: Payer: Self-pay | Admitting: Family Medicine

## 2021-09-28 ENCOUNTER — Ambulatory Visit (INDEPENDENT_AMBULATORY_CARE_PROVIDER_SITE_OTHER): Payer: Medicare Other | Admitting: Family Medicine

## 2021-09-28 VITALS — BP 106/54 | HR 92 | Wt 219.0 lb

## 2021-09-28 DIAGNOSIS — H6121 Impacted cerumen, right ear: Secondary | ICD-10-CM | POA: Diagnosis not present

## 2021-09-28 DIAGNOSIS — R42 Dizziness and giddiness: Secondary | ICD-10-CM | POA: Diagnosis not present

## 2021-09-28 DIAGNOSIS — I10 Essential (primary) hypertension: Secondary | ICD-10-CM

## 2021-09-28 NOTE — Assessment & Plan Note (Signed)
Pressure looks good in fact it is a little bit on the lower end.  But what she is describing sounds more like vertigo or swaying sensation its not a sensation of feeling like she is going to pass out or blackout.

## 2021-09-28 NOTE — Progress Notes (Signed)
Acute Office Visit  Subjective:     Patient ID: Donna Sexton, female    DOB: 1950-05-21, 71 y.o.   MRN: [id number]  Chief Complaint  Patient presents with   Tinnitus    HPI Patient is in today for feeling off balance for the last month. Says she feelt like she is swaying but no one around her notices.  She says she notices it most often after she stands up for a minute or so.  She says the longer she stands sometimes the worse it gets.  She does not feel like she is going to pass out per se.  No recent injuries..  In fact she fell on September 11.  She tripped over something that she had sitting in the floor that she was going to get a neighbor.  She had a box in her hand at the time.  She said she felt a little sore for couple days but has not had any residual discomfort or pain.  She says she does not want a walk and exercise or sometimes even go out socially because of it.  History of migraine headaches  She is also had a little bit of swelling around her left ankle.  ROS      Objective:    BP (!) 106/54   Pulse 92   Wt 219 lb (99.3 kg)   SpO2 99%   BMI 37.59 kg/m    Physical Exam Constitutional:      Appearance: She is well-developed.  HENT:     Head: Normocephalic and atraumatic.     Right Ear: Ear canal and external ear normal.     Left Ear: Tympanic membrane, ear canal and external ear normal.     Ears:     Comments: Right TM blocked by cerumen. Repeat exam was normal.      Nose: Nose normal.  Eyes:     Conjunctiva/sclera: Conjunctivae normal.     Pupils: Pupils are equal, round, and reactive to light.  Neck:     Thyroid: No thyromegaly.  Cardiovascular:     Rate and Rhythm: Normal rate and regular rhythm.     Heart sounds: Normal heart sounds.  Pulmonary:     Effort: Pulmonary effort is normal.     Breath sounds: Normal breath sounds. No wheezing.  Musculoskeletal:     Cervical back: Neck supple.  Lymphadenopathy:     Cervical: No cervical  adenopathy.  Skin:    General: Skin is warm and dry.  Neurological:     Mental Status: She is alert and oriented to person, place, and time.     Comments: She did have some nystagmus after sitting up after doing the Dix-Hallpike maneuver to the left.  She had some mild sensations after sitting up doing the Dix-Hallpike to the right but it was significantly worse to the left.  She did not have nystagmus while laying on her back with head rotated.     No results found for any visits on 09/28/21.      Assessment & Plan:   Problem List Items Addressed This Visit       Cardiovascular and Mediastinum   Essential hypertension (Chronic)    Pressure looks good in fact it is a little bit on the lower end.  But what she is describing sounds more like vertigo or swaying sensation its not a sensation of feeling like she is going to pass out or blackout.      Other  Visit Diagnoses     Vertigo    -  Primary   Relevant Orders   Ambulatory referral to Physical Therapy   Hearing loss due to cerumen impaction, right           Vertigo -she had nystagmus after sitting up with doing the Dix-Hallpike maneuver to the left.  It did not happen with her head rotated and lying on her back but I do strongly suspect that she has vertigo based on the description of her symptoms.  I like to refer her to formal physical therapy for vestibular rehab I think should be a great candidate and would do well.  Indication: Cerumen impaction of the ear(s) Medical necessity statement: On physical examination, cerumen impairs clinically significant portions of the external auditory canal, and tympanic membrane. Noted obstructive, copious cerumen that cannot be removed without magnification  Consent: Discussed benefits and risks of procedure and verbal consent obtained Procedure: Patient was prepped for the procedure. Utilized an otoscope to assess and take note of the ear canal, the tympanic membrane, and the presence,  amount, and placement of the cerumen. Gentle water irrigation and soft plastic curette was utilized to remove cerumen.  Post procedure examination: shows cerumen was completely removed. Patient tolerated procedure well. The patient is made aware that they may experience temporary vertigo, temporary hearing loss, and temporary discomfort. If these symptom last for more than 24 hours to call the clinic or proceed to the ED.    Given flu vaccine today.    No orders of the defined types were placed in this encounter.   No follow-ups on file.  Nani Gasser, MD

## 2021-10-05 ENCOUNTER — Other Ambulatory Visit: Payer: Self-pay | Admitting: Family Medicine

## 2021-10-06 ENCOUNTER — Ambulatory Visit: Payer: Medicare Other | Attending: Family Medicine | Admitting: Physical Therapy

## 2021-10-06 ENCOUNTER — Encounter: Payer: Self-pay | Admitting: Physical Therapy

## 2021-10-06 DIAGNOSIS — R42 Dizziness and giddiness: Secondary | ICD-10-CM | POA: Diagnosis not present

## 2021-10-06 DIAGNOSIS — M6281 Muscle weakness (generalized): Secondary | ICD-10-CM | POA: Diagnosis not present

## 2021-10-06 DIAGNOSIS — R2689 Other abnormalities of gait and mobility: Secondary | ICD-10-CM | POA: Diagnosis not present

## 2021-10-06 DIAGNOSIS — R2681 Unsteadiness on feet: Secondary | ICD-10-CM | POA: Insufficient documentation

## 2021-10-06 NOTE — Therapy (Signed)
OUTPATIENT PHYSICAL THERAPY VESTIBULAR EVALUATION     Patient Name: Donna Sexton MRN: [id number] DOB:May 22, 1950, 71 y.o., female Today's Date: 10/06/2021  PCP: Agapito Games, MD  REFERRING PROVIDER: Agapito Games, MD    PT End of Session - 10/06/21 1109     Visit Number 1    Number of Visits 12    Date for PT Re-Evaluation 11/17/21    Authorization Type BCBS    PT Start Time 1107    PT Stop Time 1150    PT Time Calculation (min) 43 min    Activity Tolerance Patient tolerated treatment well    Behavior During Therapy [facility] for tasks assessed/performed             Past Medical History:  Diagnosis Date   ANEMIA 11/12/2007   Qualifier: Diagnosis of  By: Briscoe Burns CMA, [email or web address]     Anxiety    Arthritis    ASTHMA NOS W/ACUTE EXACERBATION 01/13/2010   Qualifier: Diagnosis of  By: Yetta Barre MD, Bernadene Bell.    BIPOLAR DISORDER UNSPECIFIED 10/29/2007   Qualifier: Diagnosis of  By: Yetta Barre MD, Bernadene Bell.    COMMON MIGRAINE 12/29/2008   Qualifier: Diagnosis of  By: Yetta Barre MD, Bernadene Bell.    GERD 07/28/2009   Qualifier: Diagnosis of  By: Felicity Coyer MD, Raenette Rover    HYPERLIPIDEMIA 10/29/2007   Qualifier: Diagnosis of  By: Yetta Barre MD, Bernadene Bell.    HYPERTENSION, BENIGN ESSENTIAL 10/29/2007   Qualifier: Diagnosis of  By: Yetta Barre MD, Bernadene Bell.    OSTEOPENIA 10/30/2007   Qualifier: Diagnosis of  By: Yetta Barre MD, Bernadene Bell.    OVERACTIVE BLADDER 02/21/2008   Qualifier: Diagnosis of  By: Jonny Ruiz MD, Len Blalock    SLEEP APNEA 12/31/2009   Qualifier: Diagnosis of  By: Yetta Barre MD, Bernadene Bell.    Past Surgical History:  Procedure Laterality Date   ABDOMINAL HYSTERECTOMY     BREAST IMPLANT REMOVAL Bilateral 2022   BREAST SURGERY  01/10/1978   implants bilateral   CARPAL TUNNEL RELEASE     CHOLECYSTECTOMY     HERNIA REPAIR     PAROTID ENDOSCOPY     TUBAL LIGATION     Patient Active Problem List   Diagnosis Date Noted   DDD (degenerative disc disease), cervical 02/05/2021   Pain in both lower  extremities 10/06/2020   Chronic heart failure with preserved ejection fraction (HCC) 05/16/2020   Impaired glucose tolerance 10/10/2019   Acute pain of right shoulder 02/23/2018   GAD (generalized anxiety disorder) 12/20/2017   Trigger finger, left little finger 03/15/2017   Primary osteoarthritis of both knees 01/23/2017   Localized, primary osteoarthritis of hand, left 01/23/2017   Lumbar radiculitis 09/07/2015   IBS (irritable bowel syndrome) 08/07/2015   Elevated LFTs 05/18/2015   Hyperglycemia 05/12/2015   Lumbago 11/05/2014   Basal cell papilloma 09/11/2014   Dyspnea 09/04/2014   Restless leg 04/16/2014   Muscle spasm 02/27/2014   Overactive bladder 04/09/2013   Abnormal mammogram 06/22/2012   Bipolar disorder (HCC) 09/29/2011   Hyperlipidemia 09/29/2011   Essential hypertension 09/29/2011   Obesity (BMI 30.0-34.9) 09/26/2011   Telogen effluvium 08/16/2011   Calcium blood increased 01/11/2011   Anxiety state 10/04/2010   Clinical depression 10/04/2010   Anal bleeding 10/04/2010   Cannot sleep 10/04/2010   GERD 07/28/2009   COMMON MIGRAINE 12/29/2008   OSTEOPENIA 10/30/2007    ONSET DATE: "A couple of months ago"  REFERRING DIAG: R42 (ICD-10-CM) - Vertigo   THERAPY DIAG:  No diagnosis found.  Rationale for Evaluation and Treatment Rehabilitation  SUBJECTIVE:   SUBJECTIVE STATEMENT: Pt reports feeling lightheaded. Pt feels unsteady/moving with prolonged standing. Pt states her ears have recently gotten cleaned out. Pt reports Dr. Linford Arnold tested her laying down and felt some amount of dizziness. No nausea. Pt reports she is hearing some tinnitus but this started before her feelings of unsteadiness. Pt reports feeling pop in her neck and felt better. Pt does note headaches that come and go. Pt accompanied by: self  PERTINENT HISTORY: Has history of 5 operations (2 cataract surgeries, plastic surgery, rotator cuff surgery, low back surgery); history of TMJ  surgery   PAIN:  Are you having pain? No  PRECAUTIONS: Fall  WEIGHT BEARING RESTRICTIONS No  FALLS: Has patient fallen in last 6 months? Yes. Number of falls 1; however, reports history of multiple falls within this past year  LIVING ENVIRONMENT: Lives with: lives alone; neighbor assists her as needed Lives in: House/apartment Stairs: No Has following equipment at home: None  PLOF: Independent  PATIENT GOALS Improve dizziness  OBJECTIVE:   DIAGNOSTIC FINDINGS: n/a  COGNITION: Overall cognitive status: Within functional limits for tasks assessed   SENSATION: WFL  EDEMA:  No issues  POSTURE: rounded shoulders, forward head, and increased thoracic kyphosis   Cervical ROM:    Active A/PROM (deg) eval  Flexion 100%  Extension 100%  Right lateral flexion 30  Left lateral flexion 35  Right rotation 40  Left rotation 45  (Blank rows = not tested)  STRENGTH: did not assess this session  LOWER EXTREMITY MMT:   MMT Right eval Left eval  Hip flexion    Hip abduction    Hip adduction    Hip internal rotation    Hip external rotation    Knee flexion    Knee extension    Ankle dorsiflexion    Ankle plantarflexion    Ankle inversion    Ankle eversion    (Blank rows = not tested)  BED MOBILITY:  Rolling to Right Min A Rolling to Left SBA  TRANSFERS: Assistive device utilized: None  Sit to stand: SBA Stand to sit: SBA Chair to chair: SBA Floor:  unable without assist   CURB: CGA  GAIT: Gait pattern: step through pattern, decreased step length- Right, decreased step length- Left, decreased stance time- Right, decreased stance time- Left, lateral lean- Right, lateral lean- Left, trunk flexed, and wide BOS Distance walked: 100' Assistive device utilized: None Level of assistance: CGA Comments: unsteady, slower gait speed  FUNCTIONAL TESTs:  MCTSIB: Condition 1: Avg of 3 trials: 30 sec, Condition 2: Avg of 3 trials: 30 but very increased sway sec,  Condition 3: Avg of 3 trials: 20 very unsteady and anxious sec, Condition 4: Avg of 3 trials: 15 very unsteady and anxious sec, and Total Score: 105/120   [facility] PT Assessment - 10/06/21 0001       Standardized Balance Assessment   Standardized Balance Assessment Dynamic Gait Index      Dynamic Gait Index   Level Surface Mild Impairment    Change in Gait Speed Moderate Impairment    Gait with Horizontal Head Turns Moderate Impairment    Gait with Vertical Head Turns Moderate Impairment    Gait and Pivot Turn Moderate Impairment    Step Over Obstacle Severe Impairment    Step Around Obstacles Mild Impairment    Steps Mild Impairment    Total Score 10  PATIENT SURVEYS:  FOTO did not assess   VESTIBULAR ASSESSMENT   GENERAL OBSERVATION: Slow and cautious gait    SYMPTOM BEHAVIOR:   Subjective history: Reports most of symptoms occur with standing and amb   Non-Vestibular symptoms: neck pain, headaches, and tinnitus   Type of dizziness: Unsteady with head/body turns and Lightheadedness/Faint   Frequency: Decreased after neck pop yesterday   Duration: While standing   Aggravating factors: Induced by position change: supine to sit, Induced by motion: standing still, and Occurs when standing still    Relieving factors: rest   Progression of symptoms: better   OCULOMOTOR EXAM:   Ocular Alignment: normal   Ocular ROM: No Limitations   Spontaneous Nystagmus: absent   Gaze-Induced Nystagmus: absent   Smooth Pursuits: intact   Saccades: intact   FRENZEL - FIXATION SUPRESSED: unable to assess   VESTIBULAR - OCULAR REFLEX:    Slow VOR: Normal   VOR Cancellation: Normal   Head-Impulse Test: HIT Right: negative HIT Left: negative    POSITIONAL TESTING: Right Dix-Hallpike: no nystagmus Left Dix-Hallpike: no nystagmus Right Sidelying: no nystagmus Left Sidelying: no nystagmus    MOTION SENSITIVITY:    Motion Sensitivity Quotient  Intensity: 0 = none, 1 =  Lightheaded, 2 = Mild, 3 = Moderate, 4 = Severe, 5 = Vomiting  Intensity  1. Sitting to supine   2. Supine to L side   3. Supine to R side   4. Supine to sitting   5. L Hallpike-Dix   6. Up from L    7. R Hallpike-Dix   8. Up from R    9. Sitting, head  tipped to L knee   10. Head up from L  knee   11. Sitting, head  tipped to R knee   12. Head up from R  knee   13. Sitting head turns x5   14.Sitting head nods x5   15. In stance, 180  turn to L    16. In stance, 180  turn to R     OTHOSTATICS: not done   TREATMENT:  None initiated  PATIENT EDUCATION: Education details: Exam findings, POC Person educated: Patient Education method: Explanation and Verbal cues Education comprehension: verbalized understanding and needs further education   GOALS: Goals reviewed with patient? Yes  SHORT TERM GOALS: Target date: 11/03/2021   Pt will be ind with initial HEP Baseline: Goal status: INITIAL  2.  Pt will demo improved narrower BOS and heel to toe gait pattern during amb Baseline:  Goal status: INITIAL  3.  Pt will be able to tolerate standing on foam >= 30 sec with eyes open to demo improving balance Baseline:  Goal status: INITIAL   LONG TERM GOALS: Target date: 12/01/2021    Pt will be ind with maintaining HEP Baseline:  Goal status: INITIAL  2.  Pt will be able to hold/carry at least 15# without LOB to hold her great granddaughter safely Baseline: Needs to sit to hold granddaughter safely Goal status: INITIAL  3.  Pt will be able to improve DGI to >/=19/24 to demo decreased fall risk Baseline: 10/24 Goal status: INITIAL  4.  Pt will be able to demo 120/120 on mCTSIB to demo improved static standing balance Baseline:  Goal status: INITIAL  5.  Pt with report >/=50% improvement in unsteady feeling/sway with prolonged standing Baseline:  Goal status: INITIAL  6.  Pt will demo at least 8 point difference in Berg Balance Score to demo MCID  Baseline:   Goal status: INITIAL    ASSESSMENT:  CLINICAL IMPRESSION: Patient is a 71 y.o. F who was seen today for physical therapy evaluation and treatment for dizziness. Assessment demos more significant dysequilibrium/decreased balance vs vestibulopathy. No nystagmus noted in Dix-Hallpike position today, slight symptoms when coming out from L Dix-hallpike into sitting. VOR appears WFL with no overt increase in symptoms. Testing is more significant for decreased static and dynamic balance. Pt is a high fall risk based on DGI of 10/24. Pt would highly benefit from PT to improve on her unsteadiness and dysequilibrium. Pt has been self limiting all activities due to her history of falls and fear of falls.    OBJECTIVE IMPAIRMENTS Abnormal gait, decreased activity tolerance, decreased balance, decreased endurance, decreased mobility, difficulty walking, decreased strength, dizziness, and postural dysfunction.   ACTIVITY LIMITATIONS carrying, lifting, standing, stairs, transfers, locomotion level, and caring for others  PARTICIPATION LIMITATIONS: cleaning, laundry, interpersonal relationship, and community activity  PERSONAL FACTORS Age, Past/current experiences, and Time since onset of injury/illness/exacerbation are also affecting patient's functional outcome.   REHAB POTENTIAL: Good  CLINICAL DECISION MAKING: Evolving/moderate complexity  EVALUATION COMPLEXITY: Moderate   PLAN: PT FREQUENCY: 2x/week  PT DURATION: 8 weeks  PLANNED INTERVENTIONS: Therapeutic exercises, Therapeutic activity, Neuromuscular re-education, Balance training, Gait training, Patient/Family education, Self Care, Joint mobilization, Stair training, Vestibular training, Canalith repositioning, Aquatic Therapy, Dry Needling, Electrical stimulation, Cryotherapy, Moist heat, Taping, Ionotophoresis 4mg /ml Dexamethasone, and Manual therapy  PLAN FOR NEXT SESSION: Initiate balance and strengthening program. Consider OTAGO.  Assess Berg Balance and provide LTG   Leobardo Granlund April Ma L [place], PT 10/06/2021, 1:09 PM

## 2021-10-11 ENCOUNTER — Ambulatory Visit: Payer: Medicare Other | Admitting: Physical Therapy

## 2021-10-18 ENCOUNTER — Ambulatory Visit: Payer: Medicare Other | Attending: Family Medicine | Admitting: Physical Therapy

## 2021-10-18 ENCOUNTER — Encounter: Payer: Self-pay | Admitting: Physical Therapy

## 2021-10-18 DIAGNOSIS — R2681 Unsteadiness on feet: Secondary | ICD-10-CM | POA: Diagnosis not present

## 2021-10-18 DIAGNOSIS — R42 Dizziness and giddiness: Secondary | ICD-10-CM | POA: Insufficient documentation

## 2021-10-18 DIAGNOSIS — R2689 Other abnormalities of gait and mobility: Secondary | ICD-10-CM | POA: Diagnosis not present

## 2021-10-18 DIAGNOSIS — M6281 Muscle weakness (generalized): Secondary | ICD-10-CM | POA: Insufficient documentation

## 2021-10-18 NOTE — Therapy (Signed)
OUTPATIENT PHYSICAL THERAPY TREATMENT     Patient Name: Donna Sexton MRN: [id number] DOB:08-21-1950, 71 y.o., female Today's Date: 10/18/2021  PCP: Agapito Games, MD  REFERRING PROVIDER: Agapito Games, MD    PT End of Session - 10/18/21 1057     Visit Number 2    Number of Visits 12    Date for PT Re-Evaluation 11/17/21    Authorization Type BCBS    PT Start Time 1100    PT Stop Time 1145    PT Time Calculation (min) 45 min    Activity Tolerance Patient tolerated treatment well    Behavior During Therapy [facility] for tasks assessed/performed             Past Medical History:  Diagnosis Date   ANEMIA 11/12/2007   Qualifier: Diagnosis of  By: Briscoe Burns CMA, Bárbara.Apley     Anxiety    Arthritis    ASTHMA NOS W/ACUTE EXACERBATION 01/13/2010   Qualifier: Diagnosis of  By: Yetta Barre MD, Bernadene Bell.    BIPOLAR DISORDER UNSPECIFIED 10/29/2007   Qualifier: Diagnosis of  By: Yetta Barre MD, Bernadene Bell.    COMMON MIGRAINE 12/29/2008   Qualifier: Diagnosis of  By: Yetta Barre MD, Bernadene Bell.    GERD 07/28/2009   Qualifier: Diagnosis of  By: Felicity Coyer MD, Raenette Rover    HYPERLIPIDEMIA 10/29/2007   Qualifier: Diagnosis of  By: Yetta Barre MD, Bernadene Bell.    HYPERTENSION, BENIGN ESSENTIAL 10/29/2007   Qualifier: Diagnosis of  By: Yetta Barre MD, Bernadene Bell.    OSTEOPENIA 10/30/2007   Qualifier: Diagnosis of  By: Yetta Barre MD, Bernadene Bell.    OVERACTIVE BLADDER 02/21/2008   Qualifier: Diagnosis of  By: Jonny Ruiz MD, Len Blalock    SLEEP APNEA 12/31/2009   Qualifier: Diagnosis of  By: Yetta Barre MD, Bernadene Bell.    Past Surgical History:  Procedure Laterality Date   ABDOMINAL HYSTERECTOMY     BREAST IMPLANT REMOVAL Bilateral 2022   BREAST SURGERY  01/10/1978   implants bilateral   CARPAL TUNNEL RELEASE     CHOLECYSTECTOMY     HERNIA REPAIR     PAROTID ENDOSCOPY     TUBAL LIGATION     Patient Active Problem List   Diagnosis Date Noted   DDD (degenerative disc disease), cervical 02/05/2021   Pain in both lower extremities  10/06/2020   Chronic heart failure with preserved ejection fraction (HCC) 05/16/2020   Impaired glucose tolerance 10/10/2019   Acute pain of right shoulder 02/23/2018   GAD (generalized anxiety disorder) 12/20/2017   Trigger finger, left little finger 03/15/2017   Primary osteoarthritis of both knees 01/23/2017   Localized, primary osteoarthritis of hand, left 01/23/2017   Lumbar radiculitis 09/07/2015   IBS (irritable bowel syndrome) 08/07/2015   Elevated LFTs 05/18/2015   Hyperglycemia 05/12/2015   Lumbago 11/05/2014   Basal cell papilloma 09/11/2014   Dyspnea 09/04/2014   Restless leg 04/16/2014   Muscle spasm 02/27/2014   Overactive bladder 04/09/2013   Abnormal mammogram 06/22/2012   Bipolar disorder (HCC) 09/29/2011   Hyperlipidemia 09/29/2011   Essential hypertension 09/29/2011   Obesity (BMI 30.0-34.9) 09/26/2011   Telogen effluvium 08/16/2011   Calcium blood increased 01/11/2011   Anxiety state 10/04/2010   Clinical depression 10/04/2010   Anal bleeding 10/04/2010   Cannot sleep 10/04/2010   GERD 07/28/2009   COMMON MIGRAINE 12/29/2008   OSTEOPENIA 10/30/2007    ONSET DATE: "A couple of months ago"  REFERRING DIAG: R42 (ICD-10-CM) - Vertigo   THERAPY DIAG:  Unsteadiness on feet  Other abnormalities of gait and mobility  Muscle weakness (generalized)  Dizziness and giddiness  Rationale for Evaluation and Treatment Rehabilitation  SUBJECTIVE:   SUBJECTIVE STATEMENT: 10/9: Pt reports nothing new or different. Notes feeling some unsteadiness coming into clinic. Requests for PT to take her BP.   Eval: Pt reports feeling lightheaded. Pt feels unsteady/moving with prolonged standing. Pt states her ears have recently gotten cleaned out. Pt reports Dr. Madilyn Fireman tested her laying down and felt some amount of dizziness. No nausea. Pt reports she is hearing some tinnitus but this started before her feelings of unsteadiness. Pt reports feeling pop in her neck and  felt better. Pt does note headaches that come and go. Pt accompanied by: self  PERTINENT HISTORY: Has history of 5 operations (2 cataract surgeries, plastic surgery, rotator cuff surgery, low back surgery); history of TMJ surgery  PAIN:  Are you having pain? No  PRECAUTIONS: Fall  WEIGHT BEARING RESTRICTIONS No  FALLS: Has patient fallen in last 6 months? Yes. Number of falls 1; however, reports history of multiple falls within this past year  LIVING ENVIRONMENT: Lives with: lives alone; neighbor assists her as needed Lives in: House/apartment Stairs: No Has following equipment at home: None  PLOF: Independent  PATIENT GOALS Improve dizziness  OBJECTIVE:   DIAGNOSTIC FINDINGS: n/a  COGNITION: Overall cognitive status: Within functional limits for tasks assessed   SENSATION: WFL  EDEMA:  No issues  POSTURE: rounded shoulders, forward head, and increased thoracic kyphosis   Cervical ROM:    Active A/PROM (deg) eval  Flexion 100%  Extension 100%  Right lateral flexion 30  Left lateral flexion 35  Right rotation 40  Left rotation 45  (Blank rows = not tested)  STRENGTH: did not assess this session   BED MOBILITY:  Rolling to Right Min A Rolling to Left SBA  TRANSFERS: Assistive device utilized: None  Sit to stand: SBA Stand to sit: SBA Chair to chair: SBA Floor:  unable without assist  CURB: CGA  GAIT: Gait pattern: step through pattern, decreased step length- Right, decreased step length- Left, decreased stance time- Right, decreased stance time- Left, lateral lean- Right, lateral lean- Left, trunk flexed, and wide BOS Distance walked: 100' Assistive device utilized: None Level of assistance: CGA Comments: unsteady, slower gait speed  FUNCTIONAL TESTs:  FROM EVAL MCTSIB: Condition 1: Avg of 3 trials: 30 sec, Condition 2: Avg of 3 trials: 30 but very increased sway sec, Condition 3: Avg of 3 trials: 20 very unsteady and anxious sec, Condition  4: Avg of 3 trials: 15 very unsteady and anxious sec, and Total Score: 105/120  10/9: 5x STS 18 sec   [facility] PT Assessment - 10/18/21 0001       Standardized Balance Assessment   Standardized Balance Assessment Berg Balance Test      Berg Balance Test   Sit to Stand Able to stand  independently using hands    Standing Unsupported Able to stand safely 2 minutes    Sitting with Back Unsupported but Feet Supported on Floor or Stool Able to sit safely and securely 2 minutes    Stand to Sit Controls descent by using hands    Transfers Able to transfer safely, definite need of hands    Standing Unsupported with Eyes Closed Able to stand 10 seconds with supervision    Standing Unsupported with Feet Together Needs help to attain position but able to stand for 30 seconds with feet together  From Standing, Reach Forward with Outstretched Arm Can reach confidently >25 cm (10")    From Standing Position, Pick up Object from Floor Able to pick up shoe safely and easily    From Standing Position, Turn to Look Behind Over each Shoulder Turn sideways only but maintains balance    Turn 360 Degrees Able to turn 360 degrees safely but slowly    Standing Unsupported, Alternately Place Feet on Step/Stool Able to stand independently and complete 8 steps >20 seconds   Can do 4" step but not 6"   Standing Unsupported, One Foot in Front Able to plae foot ahead of the other independently and hold 30 seconds    Standing on One Leg Tries to lift leg/unable to hold 3 seconds but remains standing independently    Total Score 40    Berg comment: </=45/56 is a high fall risk              PATIENT SURVEYS:  FOTO did not assess   VESTIBULAR ASSESSMENT  FROM EVAL  GENERAL OBSERVATION: Slow and cautious gait    SYMPTOM BEHAVIOR:   Subjective history: Reports most of symptoms occur with standing and amb   Non-Vestibular symptoms: neck pain, headaches, and tinnitus   Type of dizziness: Unsteady with  head/body turns and Lightheadedness/Faint   Frequency: Decreased after neck pop yesterday   Duration: While standing   Aggravating factors: Induced by position change: supine to sit, Induced by motion: standing still, and Occurs when standing still    Relieving factors: rest   Progression of symptoms: better   OCULOMOTOR EXAM:   Ocular Alignment: normal   Ocular ROM: No Limitations   Spontaneous Nystagmus: absent   Gaze-Induced Nystagmus: absent   Smooth Pursuits: intact   Saccades: intact   FRENZEL - FIXATION SUPRESSED: unable to assess   VESTIBULAR - OCULAR REFLEX:    Slow VOR: Normal   VOR Cancellation: Normal   Head-Impulse Test: HIT Right: negative HIT Left: negative    POSITIONAL TESTING: Right Dix-Hallpike: no nystagmus Left Dix-Hallpike: no nystagmus Right Sidelying: no nystagmus Left Sidelying: no nystagmus   VITALS: 10/18/21 140/86 sitting   TREATMENT: 10/18/21 NMED Feet together EC 3x10 sec Alt foot tap 4" step 3x10 Slow marching 2x10 Partial tandem x20 sec R&L  THEREX STS 3x5 Standing knee flexion 2x10    PATIENT EDUCATION: Education details: Exam findings, POC Person educated: Patient Education method: Explanation and Verbal cues Education comprehension: verbalized understanding and needs further education  HOME EXERCISE PROGRAM Access Code: [email or web address] URL: [email or web address] Date: 10/18/2021 Prepared by: Vernon Prey April Kirstie Peri  Exercises - Corner Balance Feet Together With Eyes Closed  - 1 x daily - 7 x weekly - 3 sets - 10 sec hold - Standing Romberg to 1/2 Tandem Stance  - 1 x daily - 7 x weekly - 2 sets - 20 sec hold - Sit to Stand  - 1 x daily - 7 x weekly - 3 sets - 5 reps - Standing Marching  - 1 x daily - 7 x weekly - 2 sets - 10 reps   GOALS: Goals reviewed with patient? Yes  SHORT TERM GOALS: Target date: 11/03/2021   Pt will be ind with initial HEP Baseline: Goal status: INITIAL  2.  Pt will demo  improved narrower BOS and heel to toe gait pattern during amb Baseline:  Goal status: INITIAL  3.  Pt will be able to tolerate standing on foam >= 30 sec with eyes open to demo  improving balance Baseline:  Goal status: INITIAL   LONG TERM GOALS: Target date: 12/01/2021    Pt will be ind with maintaining HEP Baseline:  Goal status: INITIAL  2.  Pt will be able to hold/carry at least 15# without LOB to hold her great granddaughter safely Baseline: Needs to sit to hold granddaughter safely Goal status: INITIAL  3.  Pt will be able to improve DGI to >/=19/24 to demo decreased fall risk Baseline: 10/24 Goal status: INITIAL  4.  Pt will be able to demo 120/120 on mCTSIB to demo improved static standing balance Baseline:  Goal status: INITIAL  5.  Pt with report >/=50% improvement in unsteady feeling/sway with prolonged standing Baseline:  Goal status: INITIAL  6.  Pt will demo at least 8 point difference in Berg Balance Score to demo MCID Baseline:  Goal status: INITIAL    ASSESSMENT:  CLINICAL IMPRESSION: 10/18/21: Performed Berg Balance Test this session -- pt scored 40/56 indicating high fall risk. Focused on initiating a balance/strengthening program for home. Pt has difficulty with weight shifting and maintaining stability. Pt request to check BP this am. Found to be within normal limits. Requires rest breaks due to decreased activity tolerance.   From eval: Patient is a 71 y.o. F who was seen today for physical therapy evaluation and treatment for dizziness. Assessment demos more significant dysequilibrium/decreased balance vs vestibulopathy. No nystagmus noted in Dix-Hallpike position today, slight symptoms when coming out from L Dix-hallpike into sitting. VOR appears WFL with no overt increase in symptoms. Testing is more significant for decreased static and dynamic balance. Pt is a high fall risk based on DGI of 10/24. Pt would highly benefit from PT to improve on her  unsteadiness and dysequilibrium. Pt has been self limiting all activities due to her history of falls and fear of falls.    OBJECTIVE IMPAIRMENTS Abnormal gait, decreased activity tolerance, decreased balance, decreased endurance, decreased mobility, difficulty walking, decreased strength, dizziness, and postural dysfunction.   ACTIVITY LIMITATIONS carrying, lifting, standing, stairs, transfers, locomotion level, and caring for others  PARTICIPATION LIMITATIONS: cleaning, laundry, interpersonal relationship, and community activity  PERSONAL FACTORS Age, Past/current experiences, and Time since onset of injury/illness/exacerbation are also affecting patient's functional outcome.   REHAB POTENTIAL: Good  CLINICAL DECISION MAKING: Evolving/moderate complexity  EVALUATION COMPLEXITY: Moderate   PLAN: PT FREQUENCY: 2x/week  PT DURATION: 8 weeks  PLANNED INTERVENTIONS: Therapeutic exercises, Therapeutic activity, Neuromuscular re-education, Balance training, Gait training, Patient/Family education, Self Care, Joint mobilization, Stair training, Vestibular training, Canalith repositioning, Aquatic Therapy, Dry Needling, Electrical stimulation, Cryotherapy, Moist heat, Taping, Ionotophoresis 4mg /ml Dexamethasone, and Manual therapy  PLAN FOR NEXT SESSION: Progress balance and strengthening program. Consider OTAGO.    Dyan Creelman April Ma L Miquan Tandon, PT, DPT 10/18/2021, 10:57 AM

## 2021-10-22 ENCOUNTER — Ambulatory Visit: Payer: Medicare Other | Admitting: Physical Therapy

## 2021-10-22 ENCOUNTER — Encounter: Payer: Self-pay | Admitting: Physical Therapy

## 2021-10-22 DIAGNOSIS — R2689 Other abnormalities of gait and mobility: Secondary | ICD-10-CM

## 2021-10-22 DIAGNOSIS — R2681 Unsteadiness on feet: Secondary | ICD-10-CM

## 2021-10-22 DIAGNOSIS — M6281 Muscle weakness (generalized): Secondary | ICD-10-CM | POA: Diagnosis not present

## 2021-10-22 DIAGNOSIS — R42 Dizziness and giddiness: Secondary | ICD-10-CM | POA: Diagnosis not present

## 2021-10-22 NOTE — Therapy (Addendum)
OUTPATIENT PHYSICAL THERAPY TREATMENT AND DISCHARGE     Patient Name: Donna Sexton MRN: [id number] DOB:Nov 14, 1950, 71 y.o., female Today's Date: 10/22/2021  PCP: Hali Marry, MD  REFERRING PROVIDER: Hali Marry, MD    PHYSICAL THERAPY DISCHARGE SUMMARY  Visits from Start of Care: 3  Current functional level related to goals / functional outcomes: See below   Remaining deficits: Pt remains unsteady with high risk of falls. Did not come to enough visits to recheck pt's goals.    Education / Equipment: See below   Patient agrees to discharge. Patient goals were not met. Patient is being discharged due to not returning since the last visit.   PT End of Session - 10/22/21 1104     Visit Number 3    Number of Visits 12    Date for PT Re-Evaluation 11/17/21    Authorization Type BCBS    PT Start Time 1102    PT Stop Time 1145    PT Time Calculation (min) 43 min    Activity Tolerance Patient tolerated treatment well    Behavior During Therapy [facility] for tasks assessed/performed             Past Medical History:  Diagnosis Date   ANEMIA 11/12/2007   Qualifier: Diagnosis of  By: Tiney Rouge CMA, Brent.Pancake     Anxiety    Arthritis    ASTHMA NOS W/ACUTE EXACERBATION 01/13/2010   Qualifier: Diagnosis of  By: Ronnald Ramp MD, Arvid Right.    BIPOLAR DISORDER UNSPECIFIED 10/29/2007   Qualifier: Diagnosis of  By: Ronnald Ramp MD, [place] MIGRAINE 12/29/2008   Qualifier: Diagnosis of  By: Ronnald Ramp MD, Arvid Right.    GERD 07/28/2009   Qualifier: Diagnosis of  By: Asa Lente MD, Jannifer Rodney    HYPERLIPIDEMIA 10/29/2007   Qualifier: Diagnosis of  By: Ronnald Ramp MD, Arvid Right.    HYPERTENSION, BENIGN ESSENTIAL 10/29/2007   Qualifier: Diagnosis of  By: Ronnald Ramp MD, Arvid Right.    OSTEOPENIA 10/30/2007   Qualifier: Diagnosis of  By: Ronnald Ramp MD, [place] BLADDER 02/21/2008   Qualifier: Diagnosis of  By: Jenny Reichmann MD, [place] 12/31/2009   Qualifier: Diagnosis of   By: Ronnald Ramp MD, Arvid Right.    Past Surgical History:  Procedure Laterality Date   ABDOMINAL HYSTERECTOMY     BREAST IMPLANT REMOVAL Bilateral 2022   BREAST SURGERY  01/10/1978   implants bilateral   CARPAL TUNNEL RELEASE     CHOLECYSTECTOMY     HERNIA REPAIR     PAROTID ENDOSCOPY     TUBAL LIGATION     Patient Active Problem List   Diagnosis Date Noted   DDD (degenerative disc disease), cervical 02/05/2021   Pain in both lower extremities 10/06/2020   Chronic heart failure with preserved ejection fraction ([place]) 05/16/2020   Impaired glucose tolerance 10/10/2019   Acute pain of right shoulder 02/23/2018   GAD (generalized anxiety disorder) 12/20/2017   Trigger finger, left little finger 03/15/2017   Primary osteoarthritis of both knees 01/23/2017   Localized, primary osteoarthritis of hand, left 01/23/2017   Lumbar radiculitis 09/07/2015   IBS (irritable bowel syndrome) 08/07/2015   Elevated LFTs 05/18/2015   Hyperglycemia 05/12/2015   Lumbago 11/05/2014   Basal cell papilloma 09/11/2014   Dyspnea 09/04/2014   Restless leg 04/16/2014   Muscle spasm 02/27/2014   Overactive bladder 04/09/2013   Abnormal mammogram 06/22/2012   Bipolar disorder ([place]) 09/29/2011  Hyperlipidemia 09/29/2011   Essential hypertension 09/29/2011   Obesity (BMI 30.0-34.9) 09/26/2011   Telogen effluvium 08/16/2011   Calcium blood increased 01/11/2011   Anxiety state 10/04/2010   Clinical depression 10/04/2010   Anal bleeding 10/04/2010   Cannot sleep 10/04/2010   GERD 07/28/2009   COMMON MIGRAINE 12/29/2008   OSTEOPENIA 10/30/2007    ONSET DATE: "A couple of months ago"  REFERRING DIAG: R42 (ICD-10-CM) - Vertigo   THERAPY DIAG:  Unsteadiness on feet  Other abnormalities of gait and mobility  Muscle weakness (generalized)  Dizziness and giddiness  Rationale for Evaluation and Treatment Rehabilitation  SUBJECTIVE:   SUBJECTIVE STATEMENT: 10/13: Pt states she has been running to and  from the vet for her dog. Vet is one hour away. Has not had time to do exercises.   Pt accompanied by: self  PERTINENT HISTORY: Has history of 5 operations (2 cataract surgeries, plastic surgery, rotator cuff surgery, low back surgery); history of TMJ surgery  PAIN:  Are you having pain? No  PRECAUTIONS: Fall  WEIGHT BEARING RESTRICTIONS No  FALLS: Has patient fallen in last 6 months? Yes. Number of falls 1; however, reports history of multiple falls within this past year  LIVING ENVIRONMENT: Lives with: lives alone; neighbor assists her as needed Lives in: House/apartment Stairs: No Has following equipment at home: None  PLOF: Independent  PATIENT GOALS Improve dizziness  OBJECTIVE:   DIAGNOSTIC FINDINGS: n/a  COGNITION: Overall cognitive status: Within functional limits for tasks assessed   SENSATION: WFL  EDEMA:  No issues  POSTURE: rounded shoulders, forward head, and increased thoracic kyphosis   Cervical ROM:    Active A/PROM (deg) eval  Flexion 100%  Extension 100%  Right lateral flexion 30  Left lateral flexion 35  Right rotation 40  Left rotation 45  (Blank rows = not tested)  STRENGTH: did not assess this session   BED MOBILITY:  Rolling to Right Min A Rolling to Left SBA  TRANSFERS: Assistive device utilized: None  Sit to stand: SBA Stand to sit: SBA Chair to chair: SBA Floor:  unable without assist  CURB: CGA  GAIT: Gait pattern: step through pattern, decreased step length- Right, decreased step length- Left, decreased stance time- Right, decreased stance time- Left, lateral lean- Right, lateral lean- Left, trunk flexed, and wide BOS Distance walked: 100' Assistive device utilized: None Level of assistance: CGA Comments: unsteady, slower gait speed  FUNCTIONAL TESTs:  FROM EVAL MCTSIB: Condition 1: Avg of 3 trials: 30 sec, Condition 2: Avg of 3 trials: 30 but very increased sway sec, Condition 3: Avg of 3 trials: 20 very  unsteady and anxious sec, Condition 4: Avg of 3 trials: 15 very unsteady and anxious sec, and Total Score: 105/120  10/9: 5x STS 18 sec      PATIENT SURVEYS:  FOTO did not assess   VESTIBULAR ASSESSMENT  FROM EVAL  GENERAL OBSERVATION: Slow and cautious gait    SYMPTOM BEHAVIOR:   Subjective history: Reports most of symptoms occur with standing and amb   Non-Vestibular symptoms: neck pain, headaches, and tinnitus   Type of dizziness: Unsteady with head/body turns and Lightheadedness/Faint   Frequency: Decreased after neck pop yesterday   Duration: While standing   Aggravating factors: Induced by position change: supine to sit, Induced by motion: standing still, and Occurs when standing still    Relieving factors: rest   Progression of symptoms: better   OCULOMOTOR EXAM:   Ocular Alignment: normal   Ocular ROM: No  Limitations   Spontaneous Nystagmus: absent   Gaze-Induced Nystagmus: absent   Smooth Pursuits: intact   Saccades: intact   FRENZEL - FIXATION SUPRESSED: unable to assess   VESTIBULAR - OCULAR REFLEX:    Slow VOR: Normal   VOR Cancellation: Normal   Head-Impulse Test: HIT Right: negative HIT Left: negative    POSITIONAL TESTING: Right Dix-Hallpike: no nystagmus Left Dix-Hallpike: no nystagmus Right Sidelying: no nystagmus Left Sidelying: no nystagmus   VITALS: 10/18/21 140/86 sitting   TREATMENT: 10/22/21 THEREX Nustep L5 x 6 min UEs/LEs Heel/toe raise 2x10 Standing knee flexion 2x10 STS 3x5 Hip abduction x10 with UE support, x10 without UE support Hip ext x10 with UE support, x10 without UE support  NMED Slow marching 2x10 Feet together EC 2x20 sec Feet together EC head nods x20 sec, EC head turns x20 sec Feet together EC bowing x10    10/18/21 NMED Feet together EC 3x10 sec Alt foot tap 4" step 3x10 Slow marching 2x10 Partial tandem x20 sec R&L  THEREX STS 3x5 Standing knee flexion 2x10    PATIENT EDUCATION: Education  details: Exam findings, POC Person educated: Patient Education method: Explanation and Verbal cues Education comprehension: verbalized understanding and needs further education  HOME EXERCISE PROGRAM Access Code: [email or web address] URL: [email or web address] Date: 10/18/2021 Prepared by: Estill Bamberg April Thurnell Garbe  Exercises - Corner Balance Feet Together With Eyes Closed  - 1 x daily - 7 x weekly - 3 sets - 10 sec hold - Standing Romberg to 1/2 Tandem Stance  - 1 x daily - 7 x weekly - 2 sets - 20 sec hold - Sit to Stand  - 1 x daily - 7 x weekly - 3 sets - 5 reps - Standing Marching  - 1 x daily - 7 x weekly - 2 sets - 10 reps   GOALS: Goals reviewed with patient? Yes  SHORT TERM GOALS: Target date: 11/03/2021   Pt will be ind with initial HEP Baseline: Goal status: INITIAL  2.  Pt will demo improved narrower BOS and heel to toe gait pattern during amb Baseline:  Goal status: INITIAL  3.  Pt will be able to tolerate standing on foam >= 30 sec with eyes open to demo improving balance Baseline:  Goal status: INITIAL   LONG TERM GOALS: Target date: 12/01/2021    Pt will be ind with maintaining HEP Baseline:  Goal status: INITIAL  2.  Pt will be able to hold/carry at least 15# without LOB to hold her great granddaughter safely Baseline: Needs to sit to hold granddaughter safely Goal status: INITIAL  3.  Pt will be able to improve DGI to >/=19/24 to demo decreased fall risk Baseline: 10/24 Goal status: INITIAL  4.  Pt will be able to demo 120/120 on mCTSIB to demo improved static standing balance Baseline:  Goal status: INITIAL  5.  Pt with report >/=50% improvement in unsteady feeling/sway with prolonged standing Baseline:  Goal status: INITIAL  6.  Pt will demo at least 8 point difference in Berg Balance Score to demo MCID Baseline:  Goal status: INITIAL    ASSESSMENT:  CLINICAL IMPRESSION: Session focused on continuing hip/ankle strengthening,  improving single leg stability and static balance.    OBJECTIVE IMPAIRMENTS Abnormal gait, decreased activity tolerance, decreased balance, decreased endurance, decreased mobility, difficulty walking, decreased strength, dizziness, and postural dysfunction.   ACTIVITY LIMITATIONS carrying, lifting, standing, stairs, transfers, locomotion level, and caring for others  PARTICIPATION LIMITATIONS: cleaning, laundry, interpersonal relationship, and community  activity  PERSONAL FACTORS Age, Past/current experiences, and Time since onset of injury/illness/exacerbation are also affecting patient's functional outcome.   REHAB POTENTIAL: Good  CLINICAL DECISION MAKING: Evolving/moderate complexity  EVALUATION COMPLEXITY: Moderate   PLAN: PT FREQUENCY: 2x/week  PT DURATION: 8 weeks  PLANNED INTERVENTIONS: Therapeutic exercises, Therapeutic activity, Neuromuscular re-education, Balance training, Gait training, Patient/Family education, Self Care, Joint mobilization, Stair training, Vestibular training, Canalith repositioning, Aquatic Therapy, Dry Needling, Electrical stimulation, Cryotherapy, Moist heat, Taping, Ionotophoresis 24m/ml Dexamethasone, and Manual therapy  PLAN FOR NEXT SESSION: Progress balance and strengthening program. Continue OTAGO.    Tanzie Rothschild April Ma L Amaranta Mehl, PT, DPT 10/22/2021, 11:04 AM

## 2021-10-25 ENCOUNTER — Encounter: Payer: Self-pay | Admitting: Physical Therapy

## 2021-10-26 DIAGNOSIS — Z85828 Personal history of other malignant neoplasm of skin: Secondary | ICD-10-CM | POA: Diagnosis not present

## 2021-10-26 DIAGNOSIS — L304 Erythema intertrigo: Secondary | ICD-10-CM | POA: Diagnosis not present

## 2021-10-26 DIAGNOSIS — D1801 Hemangioma of skin and subcutaneous tissue: Secondary | ICD-10-CM | POA: Diagnosis not present

## 2021-10-26 DIAGNOSIS — L821 Other seborrheic keratosis: Secondary | ICD-10-CM | POA: Diagnosis not present

## 2021-10-28 ENCOUNTER — Other Ambulatory Visit: Payer: Self-pay | Admitting: Family Medicine

## 2021-11-01 ENCOUNTER — Ambulatory Visit: Payer: Medicare Other | Admitting: Physical Therapy

## 2021-11-04 DIAGNOSIS — M170 Bilateral primary osteoarthritis of knee: Secondary | ICD-10-CM | POA: Diagnosis not present

## 2021-11-26 DIAGNOSIS — E785 Hyperlipidemia, unspecified: Secondary | ICD-10-CM | POA: Diagnosis not present

## 2021-11-26 DIAGNOSIS — Z79899 Other long term (current) drug therapy: Secondary | ICD-10-CM | POA: Diagnosis not present

## 2021-11-26 DIAGNOSIS — I5030 Unspecified diastolic (congestive) heart failure: Secondary | ICD-10-CM | POA: Diagnosis not present

## 2021-11-26 DIAGNOSIS — I110 Hypertensive heart disease with heart failure: Secondary | ICD-10-CM | POA: Diagnosis not present

## 2021-11-26 DIAGNOSIS — Z8639 Personal history of other endocrine, nutritional and metabolic disease: Secondary | ICD-10-CM | POA: Diagnosis not present

## 2021-11-26 DIAGNOSIS — I5032 Chronic diastolic (congestive) heart failure: Secondary | ICD-10-CM | POA: Diagnosis not present

## 2021-11-26 DIAGNOSIS — R7303 Prediabetes: Secondary | ICD-10-CM | POA: Diagnosis not present

## 2021-12-07 ENCOUNTER — Other Ambulatory Visit: Payer: Self-pay | Admitting: Family Medicine

## 2021-12-07 DIAGNOSIS — I10 Essential (primary) hypertension: Secondary | ICD-10-CM

## 2021-12-27 ENCOUNTER — Other Ambulatory Visit: Payer: Self-pay | Admitting: Family Medicine

## 2022-01-21 ENCOUNTER — Other Ambulatory Visit: Payer: Self-pay | Admitting: Family Medicine

## 2022-01-28 DIAGNOSIS — M170 Bilateral primary osteoarthritis of knee: Secondary | ICD-10-CM | POA: Diagnosis not present

## 2022-02-04 ENCOUNTER — Ambulatory Visit (INDEPENDENT_AMBULATORY_CARE_PROVIDER_SITE_OTHER): Payer: Medicare Other | Admitting: Family Medicine

## 2022-02-04 ENCOUNTER — Encounter: Payer: Self-pay | Admitting: Family Medicine

## 2022-02-04 ENCOUNTER — Ambulatory Visit: Payer: Medicare Other | Attending: Family Medicine

## 2022-02-04 VITALS — BP 137/84 | HR 72 | Ht 64.0 in | Wt 219.0 lb

## 2022-02-04 DIAGNOSIS — I10 Essential (primary) hypertension: Secondary | ICD-10-CM

## 2022-02-04 DIAGNOSIS — R002 Palpitations: Secondary | ICD-10-CM | POA: Diagnosis not present

## 2022-02-04 DIAGNOSIS — F439 Reaction to severe stress, unspecified: Secondary | ICD-10-CM

## 2022-02-04 DIAGNOSIS — E785 Hyperlipidemia, unspecified: Secondary | ICD-10-CM

## 2022-02-04 DIAGNOSIS — Z1211 Encounter for screening for malignant neoplasm of colon: Secondary | ICD-10-CM | POA: Diagnosis not present

## 2022-02-04 DIAGNOSIS — R7302 Impaired glucose tolerance (oral): Secondary | ICD-10-CM

## 2022-02-04 NOTE — Patient Instructions (Signed)
Please schedule your Medicare wellness exam when you check out today.

## 2022-02-04 NOTE — Progress Notes (Unsigned)
Enrolled patient for a 14 day ZIo XT monitor to be mailed to patients home   DOD to read

## 2022-02-04 NOTE — Assessment & Plan Note (Signed)
To recheck lipid panel.

## 2022-02-04 NOTE — Assessment & Plan Note (Signed)
Lan to recheck A1c at next office visit.  Lab Results  Component Value Date   HGBA1C 5.3 09/16/2020

## 2022-02-04 NOTE — Progress Notes (Signed)
Established Patient Office Visit  Subjective   Patient ID: Donna Sexton, female    DOB: May 07, 1950  Age: 72 y.o. MRN: [id number]  Chief Complaint  Patient presents with   Hypertension    HPI Hypertension- Pt denies chest pain, SOB, dizziness, or heart palpitations.  Taking meds as directed w/o problems.  Denies medication side effects.    Palpitations-she also reports that in the evenings when she is watching TV sometimes she will feel some palpitations.  It happens about once a week and lasts about 15 to 20 minutes when it happens.  She does not feel particularly stressed.  No recent caffeine change.  No recent bump in sugar in fact she is try to cut back on sugar lately.  But she has been under a lot of stress.  She feels like her stress levels have been incredibly high and she just feels very overwhelmed helping take care of her ex-husband who is been in and out of rehab after surgery.  He does not have any other caretakers or family members that could help him.  She does have a neighbor who has been helpful to her and her ex-husband and that has been a relief.  She was very tearful today.    ROS    Objective:     BP 137/84   Pulse 72   Ht 5\' 4"  (1.626 m)   Wt 219 lb (99.3 kg)   SpO2 97%   BMI 37.59 kg/m    Physical Exam Vitals and nursing note reviewed.  Constitutional:      Appearance: She is well-developed.  HENT:     Head: Normocephalic and atraumatic.  Cardiovascular:     Rate and Rhythm: Normal rate and regular rhythm.     Heart sounds: Normal heart sounds.  Pulmonary:     Effort: Pulmonary effort is normal.     Breath sounds: Normal breath sounds.  Skin:    General: Skin is warm and dry.  Neurological:     Mental Status: She is alert and oriented to person, place, and time.  Psychiatric:        Behavior: Behavior normal.      No results found for any visits on 02/04/22.    The 10-year ASCVD risk score (Arnett DK, et al., 2019) is: 16.4%     Assessment & Plan:   Problem List Items Addressed This Visit       Cardiovascular and Mediastinum   Essential hypertension - Primary (Chronic)    1 pressure looks great today.  She thought it would be elevated but it really looks great.  Continue current regimen.  Due for updated labs.  Follow-up in 6 months.      Relevant Orders   COMPLETE METABOLIC PANEL WITH GFR   Lipid Panel w/reflex Direct LDL   LONG TERM MONITOR (3-14 DAYS)     Endocrine   Impaired glucose tolerance    Lan to recheck A1c at next office visit.  Lab Results  Component Value Date   HGBA1C 5.3 09/16/2020          Other   Hyperlipidemia (Chronic)    To recheck lipid panel.      Relevant Orders   COMPLETE METABOLIC PANEL WITH GFR   Lipid Panel w/reflex Direct LDL   Palpitations   Relevant Orders   EKG 12-Lead   LONG TERM MONITOR (3-14 DAYS)   Other Visit Diagnoses     Screening for malignant neoplasm of colon  Relevant Orders   Cologuard   Stress          Palpitations-I do think it could be stress and anxiety induced.  But I do think it is worth further evaluation and workup.  Will schedule for a heart monitor.  She says it happens about once a week so we will probably try to get about 2 weeks worth of data to see if we can figure out what might be going on.  She is overdue for labs so that we will make sure that those are updated she is not having any count of electrolyte imbalance.  EKG today shows rate of 70 bpm, normal sinus rhythm with no acute abnormalities.  No major changes compared to old EKG from July 2023.  Stress-encouraged her to take a little time for herself.  And not feel like she is overly committed to saying yes to her ex-husband.  He does need some additional support but he also has other friends that could be helpful for him and she needs to encourage him to use that I offered to refer her for counseling or therapy but she says she does have someone that she can talk  to.  Return in about 6 months (around 08/05/2022) for bp.    Beatrice Lecher, MD

## 2022-02-04 NOTE — Assessment & Plan Note (Signed)
1 pressure looks great today.  She thought it would be elevated but it really looks great.  Continue current regimen.  Due for updated labs.  Follow-up in 6 months.

## 2022-02-05 LAB — COMPLETE METABOLIC PANEL WITH GFR
AG Ratio: 2.2 (calc) (ref 1.0–2.5)
ALT: 15 U/L (ref 6–29)
AST: 13 U/L (ref 10–35)
Albumin: 4.1 g/dL (ref 3.6–5.1)
Alkaline phosphatase (APISO): 67 U/L (ref 37–153)
BUN: 18 mg/dL (ref 7–25)
CO2: 30 mmol/L (ref 20–32)
Calcium: 9.2 mg/dL (ref 8.6–10.4)
Chloride: 101 mmol/L (ref 98–110)
Creat: 0.75 mg/dL (ref 0.60–1.00)
Globulin: 1.9 g/dL (calc) (ref 1.9–3.7)
Glucose, Bld: 89 mg/dL (ref 65–99)
Potassium: 4.2 mmol/L (ref 3.5–5.3)
Sodium: 142 mmol/L (ref 135–146)
Total Bilirubin: 1.1 mg/dL (ref 0.2–1.2)
Total Protein: 6 g/dL — ABNORMAL LOW (ref 6.1–8.1)
eGFR: 85 mL/min/{1.73_m2} (ref >=60)

## 2022-02-05 LAB — LIPID PANEL W/REFLEX DIRECT LDL
Cholesterol: 154 mg/dL (ref <200)
HDL: 62 mg/dL (ref >=50)
LDL Cholesterol (Calc): 72 mg/dL (calc)
Non-HDL Cholesterol (Calc): 92 mg/dL (calc) (ref <130)
Total CHOL/HDL Ratio: 2.5 (calc) (ref <5.0)
Triglycerides: 121 mg/dL (ref <150)

## 2022-02-07 NOTE — Progress Notes (Signed)
Hi Donna Sexton, good news is that your sodium is back to normal which is great.  Protein level still little borderline low.  But your liver enzymes and kidney function are good.  And your electrolytes are as good as well.  Your cholesterol  looks great as well.

## 2022-02-10 DIAGNOSIS — R002 Palpitations: Secondary | ICD-10-CM

## 2022-02-10 DIAGNOSIS — I10 Essential (primary) hypertension: Secondary | ICD-10-CM | POA: Diagnosis not present

## 2022-02-16 DIAGNOSIS — M19011 Primary osteoarthritis, right shoulder: Secondary | ICD-10-CM | POA: Diagnosis not present

## 2022-02-16 DIAGNOSIS — Z9889 Other specified postprocedural states: Secondary | ICD-10-CM | POA: Diagnosis not present

## 2022-02-22 ENCOUNTER — Other Ambulatory Visit: Payer: Self-pay | Admitting: Family Medicine

## 2022-02-22 DIAGNOSIS — Z1211 Encounter for screening for malignant neoplasm of colon: Secondary | ICD-10-CM | POA: Diagnosis not present

## 2022-03-03 ENCOUNTER — Encounter: Payer: Self-pay | Admitting: Psychiatry

## 2022-03-03 ENCOUNTER — Ambulatory Visit: Payer: Medicare Other | Admitting: Psychiatry

## 2022-03-03 DIAGNOSIS — F1311 Sedative, hypnotic or anxiolytic abuse, in remission: Secondary | ICD-10-CM | POA: Diagnosis not present

## 2022-03-03 DIAGNOSIS — F319 Bipolar disorder, unspecified: Secondary | ICD-10-CM | POA: Diagnosis not present

## 2022-03-03 DIAGNOSIS — F5105 Insomnia due to other mental disorder: Secondary | ICD-10-CM

## 2022-03-03 DIAGNOSIS — F411 Generalized anxiety disorder: Secondary | ICD-10-CM

## 2022-03-03 LAB — COLOGUARD: COLOGUARD: NEGATIVE

## 2022-03-03 MED ORDER — DIVALPROEX SODIUM ER 500 MG PO TB24: 1500.0000 mg | ORAL_TABLET | Freq: Every day | ORAL | 1 refills | Status: DC

## 2022-03-03 MED ORDER — DOXEPIN HCL 25 MG PO CAPS: 25.0000 mg | ORAL_CAPSULE | Freq: Every day | ORAL | 1 refills | Status: DC
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
## 2022-03-04 DIAGNOSIS — I10 Essential (primary) hypertension: Secondary | ICD-10-CM | POA: Diagnosis not present

## 2022-03-04 DIAGNOSIS — R002 Palpitations: Secondary | ICD-10-CM | POA: Diagnosis not present

## 2022-03-04 NOTE — Progress Notes (Signed)
Great news! Your Cologuard test is negative.  Recommend repeat colon cancer screening in 3 years.

## 2022-03-07 ENCOUNTER — Other Ambulatory Visit: Payer: Self-pay | Admitting: Family Medicine

## 2022-03-07 ENCOUNTER — Other Ambulatory Visit: Payer: Self-pay

## 2022-03-07 NOTE — Progress Notes (Signed)
HI Brave,  Please let her know that her heart monitor did show that she had about 37 runs of a fast heartbeat called SVT.  It comes from the top part of the heart.  The fastest interval was 190 bpm the long lasting about 20 points beats in a row.  So I really like to get her in with cardiology to discuss options to help control this so that she is not feeling it.  She also had some occasional early beats which can feel like palpitations as well.  If she is okay with referral to cardiology please let me know if she has a preference for certain provider or location.  Also encouraged her to schedule her Medicare wellness exam with [place].

## 2022-03-07 NOTE — Progress Notes (Signed)
Okay, then then please forward the results to Dr. Einar Gip to see if he wants to see her sooner or make any adjustments to her medication regimen.

## 2022-03-14 ENCOUNTER — Encounter: Payer: Self-pay | Admitting: Family Medicine

## 2022-03-28 ENCOUNTER — Ambulatory Visit (INDEPENDENT_AMBULATORY_CARE_PROVIDER_SITE_OTHER): Payer: Medicare Other | Admitting: Physician Assistant

## 2022-03-28 VITALS — BP 114/68 | HR 77 | Ht 64.0 in | Wt 232.0 lb

## 2022-03-28 DIAGNOSIS — Z1231 Encounter for screening mammogram for malignant neoplasm of breast: Secondary | ICD-10-CM

## 2022-03-28 DIAGNOSIS — Z0000 Encounter for general adult medical examination without abnormal findings: Secondary | ICD-10-CM | POA: Diagnosis not present

## 2022-03-28 DIAGNOSIS — Z780 Asymptomatic menopausal state: Secondary | ICD-10-CM

## 2022-03-28 NOTE — Patient Instructions (Addendum)
[place] Maintenance Summary and Written Plan of Care  Donna Sexton ,  Thank you for allowing me to perform your Medicare Annual Wellness Visit and for your ongoing commitment to your health.   Health Maintenance & Immunization History Health Maintenance  Topic Date Due   INFLUENZA VACCINE  04/10/2022 (Originally 08/10/2021)   COVID-19 Vaccine (5 - 2023-24 season) 02/21/2023 (Originally 09/10/2021)   MAMMOGRAM  05/07/2022   Medicare Annual Wellness (AWV)  03/28/2023   DTaP/Tdap/Td (4 - Td or Tdap) 04/16/2023   Fecal DNA (Cologuard)  02/22/2025   Pneumonia Vaccine 43+ Years old  Completed   DEXA SCAN  Completed   Hepatitis C Screening  Completed   Zoster Vaccines- Shingrix  Completed   HPV VACCINES  Aged Out   COLONOSCOPY (Pts 45-12yrs Insurance coverage will need to be confirmed)  Discontinued   Immunization History  Administered Date(s) Administered   Fluad Quad(high Dose 65+) 09/15/2020   Influenza Nasal 11/05/2010   Influenza Split 09/26/2011, 11/18/2013, 10/14/2014, 09/21/2015, 10/10/2016   Influenza Whole 10/07/2008, 09/10/2009, 11/10/2012   Influenza, High Dose Seasonal PF 11/13/2017   Influenza,Quad,Nasal, Live 11/05/2010   Influenza,inj,Quad PF,6+ Mos 11/18/2013, 10/14/2014, 09/21/2015   Influenza-Unspecified 10/10/2016   PFIZER(Purple Top)SARS-COV-2 Vaccination 02/14/2019, 03/12/2019, 10/07/2019   Pfizer Covid-19 Vaccine Bivalent Booster 32yrs & up 10/10/2020   Pneumococcal Conjugate-13 07/28/2014   Pneumococcal Polysaccharide-23 10/31/2012, 05/16/2019   Td 01/11/2004   Tdap 01/11/2004, 04/15/2013   Zoster Recombinat (Shingrix) 07/25/2017, 10/17/2017   Zoster, Live 05/22/2012, 07/25/2017, 10/17/2017    These are the patient goals that we discussed:  Goals Addressed               This Visit's Progress     Patient Stated (pt-stated)        Patient stated that she would like loose weight and to be able get her knee replacements.           This is a list of Health Maintenance Items that are overdue or due now: Screening mammography Bone densitometry screening Influenza vaccine-declines    Orders/Referrals Placed Today: Orders Placed This Encounter  Procedures   DEXAScan    Standing Status:   Future    Standing Expiration Date:   03/28/2023    Scheduling Instructions:     Please call patient to schedule.    Order Specific Question:   Reason for exam:    Answer:   post menopausal    Order Specific Question:   Preferred imaging location?    Answer:   [facility]   Mammogram 3D SCREEN BREAST BILATERAL    Standing Status:   Future    Standing Expiration Date:   03/28/2023    Scheduling Instructions:     Please call patient to schedule.    Order Specific Question:   Reason for Exam (SYMPTOM  OR DIAGNOSIS REQUIRED)    Answer:   breast cancer screening    Order Specific Question:   Preferred imaging location?    Answer:   [facility]   (Contact our referral department at [phone] if you have not spoken with someone about your referral appointment within the next 5 days)    Follow-up Plan Follow-up with Hali Marry, MD as planned Medicare wellness visit in one year.  AVS printed and given to patient.      Health Maintenance, Female Adopting a healthy lifestyle and getting preventive care are important in promoting health and wellness. Ask your health care provider about: The  right schedule for you to have regular tests and exams. Things you can do on your own to prevent diseases and keep yourself healthy. What should I know about diet, weight, and exercise? Eat a healthy diet  Eat a diet that includes plenty of vegetables, fruits, low-fat dairy products, and lean protein. Do not eat a lot of foods that are high in solid fats, added sugars, or sodium. Maintain a healthy weight Body mass index (BMI) is used to identify weight problems. It estimates body fat based on height and  weight. Your health care provider can help determine your BMI and help you achieve or maintain a healthy weight. Get regular exercise Get regular exercise. This is one of the most important things you can do for your health. Most adults should: Exercise for at least 150 minutes each week. The exercise should increase your heart rate and make you sweat (moderate-intensity exercise). Do strengthening exercises at least twice a week. This is in addition to the moderate-intensity exercise. Spend less time sitting. Even light physical activity can be beneficial. Watch cholesterol and blood lipids Have your blood tested for lipids and cholesterol at 73 years of age, then have this test every 5 years. Have your cholesterol levels checked more often if: Your lipid or cholesterol levels are high. You are older than 73 years of age. You are at high risk for heart disease. What should I know about cancer screening? Depending on your health history and family history, you may need to have cancer screening at various ages. This may include screening for: Breast cancer. Cervical cancer. Colorectal cancer. Skin cancer. Lung cancer. What should I know about heart disease, diabetes, and high blood pressure? Blood pressure and heart disease High blood pressure causes heart disease and increases the risk of stroke. This is more likely to develop in people who have high blood pressure readings or are overweight. Have your blood pressure checked: Every 3-5 years if you are 9-33 years of age. Every year if you are 52 years old or older. Diabetes Have regular diabetes screenings. This checks your fasting blood sugar level. Have the screening done: Once every three years after age 20 if you are at a normal weight and have a low risk for diabetes. More often and at a younger age if you are overweight or have a high risk for diabetes. What should I know about preventing infection? Hepatitis B If you have a  higher risk for hepatitis B, you should be screened for this virus. Talk with your health care provider to find out if you are at risk for hepatitis B infection. Hepatitis C Testing is recommended for: Everyone born from [birth year] through 1966. Anyone with known risk factors for hepatitis C. Sexually transmitted infections (STIs) Get screened for STIs, including gonorrhea and chlamydia, if: You are sexually active and are younger than 73 years of age. You are older than 73 years of age and your health care provider tells you that you are at risk for this type of infection. Your sexual activity has changed since you were last screened, and you are at increased risk for chlamydia or gonorrhea. Ask your health care provider if you are at risk. Ask your health care provider about whether you are at high risk for HIV. Your health care provider may recommend a prescription medicine to help prevent HIV infection. If you choose to take medicine to prevent HIV, you should first get tested for HIV. You should then be tested every 3  months for as long as you are taking the medicine. Pregnancy If you are about to stop having your period (premenopausal) and you may become pregnant, seek counseling before you get pregnant. Take 400 to 800 micrograms (mcg) of folic acid every day if you become pregnant. Ask for birth control (contraception) if you want to prevent pregnancy. Osteoporosis and menopause Osteoporosis is a disease in which the bones lose minerals and strength with aging. This can result in bone fractures. If you are 82 years old or older, or if you are at risk for osteoporosis and fractures, ask your health care provider if you should: Be screened for bone loss. Take a calcium or vitamin D supplement to lower your risk of fractures. Be given hormone replacement therapy (HRT) to treat symptoms of menopause. Follow these instructions at home: Alcohol use Do not drink alcohol if: Your health care  provider tells you not to drink. You are pregnant, may be pregnant, or are planning to become pregnant. If you drink alcohol: Limit how much you have to: 0-1 drink a day. Know how much alcohol is in your drink. In the [place] one drink equals one 12 oz bottle of beer (355 mL), one 5 oz glass of wine (148 mL), or one 1 oz glass of hard liquor (44 mL). Lifestyle Do not use any products that contain nicotine or tobacco. These products include cigarettes, chewing tobacco, and vaping devices, such as e-cigarettes. If you need help quitting, ask your health care provider. Do not use street drugs. Do not share needles. Ask your health care provider for help if you need support or information about quitting drugs. General instructions Schedule regular health, dental, and eye exams. Stay current with your vaccines. Tell your health care provider if: You often feel depressed. You have ever been abused or do not feel safe at home. Summary Adopting a healthy lifestyle and getting preventive care are important in promoting health and wellness. Follow your health care provider's instructions about healthy diet, exercising, and getting tested or screened for diseases. Follow your health care provider's instructions on monitoring your cholesterol and blood pressure. This information is not intended to replace advice given to you by your health care provider. Make sure you discuss any questions you have with your health care provider. Document Revised: 05/18/2020 Document Reviewed: 05/18/2020 Elsevier Patient Education  [place].

## 2022-03-28 NOTE — Progress Notes (Signed)
MEDICARE ANNUAL WELLNESS VISIT  03/28/2022  Subjective:  Donna Sexton is a 72 y.o. female patient of Metheney, Rene Kocher, MD who had a Medicare Annual Wellness Visit today. Donna Sexton is Retired and lives alone. she has 2 children. she reports that she is socially active and does interact with friends/family regularly. she is minimally physically active and enjoys making wreaths to hang on the doors  Patient Care Team: Hali Marry, MD as PCP - General (Family Medicine) Edwina Barth, MD as Referring Physician (Internal Medicine)     03/28/2022    4:10 PM 10/06/2021   11:09 AM 05/29/2018    2:17 PM 03/15/2017    8:09 AM  Advanced Directives  Does Patient Have a Medical Advance Directive? Yes Yes Yes Yes  Type of Advance Directive Living will [place];Living will [place];Living will [place];Living will  Does patient want to make changes to medical advance directive? No - Patient declined No - Patient declined No - Patient declined   Copy of [place] in Chart?   No - copy requested No - copy requested    Hospital Utilization Over the Past 12 Months: # of hospitalizations or ER visits: 0 # of surgeries: 0  Review of Systems    Patient reports that her overall health is better when compared to last year.  Review of Systems: History obtained from chart review and the patient  All other systems negative.  Pain Assessment Pain : No/denies pain     Current Medications & Allergies (verified) Allergies as of 03/28/2022       Reactions   Morphine Nausea And Vomiting   Other reaction(s): Other (See Comments) Unspecified reaction   Other Other (See Comments)   Allergic to muscle relaxers -unknown reaction Flexeril only can take cyclobenzaprine.        Medication List        Accurate as of March 28, 2022  4:39 PM. If you have any questions, ask your nurse or doctor.           amLODipine 2.5 MG tablet Commonly known as: NORVASC TAKE ONE TABLET BY MOUTH DAILY   atorvastatin 40 MG tablet Commonly known as: LIPITOR Take 1 tablet (40 mg total) by mouth daily.   benzoyl peroxide 5 % external liquid Generic drug: benzoyl peroxide Apply topically 2 (two) times daily. Please help her find over-the-counter if not covered by the insurance.  If not available please let us know we can see if the insurance will cover BenzaClin topical.   clindamycin 1 % gel Commonly known as: CLINDAGEL Apply topically 2 (two) times daily. X 10 days   diphenoxylate-atropine 2.5-0.025 MG tablet Commonly known as: LOMOTIL TAKE 1 TABLET FOUR TIMES DAILY AS NEEDED FOR DIARRHEA/LOOSE STOOLS.   divalproex 500 MG 24 hr tablet Commonly known as: DEPAKOTE ER Take 3 tablets (1,500 mg total) by mouth at bedtime.   doxepin 25 MG capsule Commonly known as: SINEQUAN Take 1 capsule (25 mg total) by mouth at bedtime.   furosemide 40 MG tablet Commonly known as: LASIX Take 40 mg by mouth daily.   labetalol 200 MG tablet Commonly known as: NORMODYNE take 1.5 tablets BY MOUTH IN THE MORNING AND ONE IN THE EVENING   oxybutynin 5 MG tablet Commonly known as: DITROPAN TAKE ONE TABLET BY MOUTH TWICE DAILY   pregabalin 50 MG capsule Commonly known as: LYRICA TAKE ONE CAPSULE BY MOUTH THREE TIMES DAILY  promethazine 25 MG tablet Commonly known as: PHENERGAN TAKE ONE TABLET BY MOUTH EVERY 8 HOURS AS NEEDED FOR NAUSEA OR FOR VOMITING        History (reviewed): Past Medical History:  Diagnosis Date   ANEMIA 11/12/2007   Qualifier: Diagnosis of  By: Tiney Rouge CMA, Ellison Hughs     Anxiety    Arthritis    ASTHMA NOS W/ACUTE EXACERBATION 01/13/2010   Qualifier: Diagnosis of  By: Ronnald Ramp MD, Arvid Right.    BIPOLAR DISORDER UNSPECIFIED 10/29/2007   Qualifier: Diagnosis of  By: Ronnald Ramp MD, [place] MIGRAINE 12/29/2008   Qualifier: Diagnosis of  By: Ronnald Ramp MD, Arvid Right.    GERD 07/28/2009    Qualifier: Diagnosis of  By: Asa Lente MD, Jannifer Rodney    HYPERLIPIDEMIA 10/29/2007   Qualifier: Diagnosis of  By: Ronnald Ramp MD, Arvid Right.    HYPERTENSION, BENIGN ESSENTIAL 10/29/2007   Qualifier: Diagnosis of  By: Ronnald Ramp MD, Arvid Right.    OSTEOPENIA 10/30/2007   Qualifier: Diagnosis of  By: Ronnald Ramp MD, [place] BLADDER 02/21/2008   Qualifier: Diagnosis of  By: Jenny Reichmann MD, Normal 12/31/2009   Qualifier: Diagnosis of  By: Ronnald Ramp MD, Arvid Right.    Past Surgical History:  Procedure Laterality Date   ABDOMINAL HYSTERECTOMY     BREAST IMPLANT REMOVAL Bilateral 2022   BREAST SURGERY  01/10/1978   implants bilateral   CARPAL TUNNEL RELEASE     CHOLECYSTECTOMY     HERNIA REPAIR     PAROTID ENDOSCOPY     TUBAL LIGATION     Family History  Problem Relation Age of Onset   Heart attack Mother    Hypertension Mother    Heart failure Mother    Heart attack Father    Hypertension Father    Diabetes Father    Heart failure Brother    Alcohol abuse Maternal Uncle    Breast cancer Neg Hx    Social History   Socioeconomic History   Marital status: Divorced    Spouse name: Not on file   Number of children: 2   Years of education: 9   Highest education level: Some college, no degree  Occupational History   Occupation: Programmer, applications    Comment: retired  Tobacco Use   Smoking status: Never   Smokeless tobacco: Never  Scientific laboratory technician Use: Never used  Substance and Sexual Activity   Alcohol use: No   Drug use: No   Sexual activity: Not Currently  Other Topics Concern   Not on file  Social History Narrative   Lives alone. She enjoys making wreaths to hang on the doors   Social Determinants of Health   Financial Resource Strain: Low Risk  (03/28/2022)   Overall Financial Resource Strain (CARDIA)    Difficulty of Paying Living Expenses: Not hard at all  Food Insecurity: No Food Insecurity (03/28/2022)   Hunger Vital Sign    Worried About Running Out of Food in  the Last Year: Never true    Ran Out of Food in the Last Year: Never true  Transportation Needs: No Transportation Needs (03/28/2022)   PRAPARE - Hydrologist (Medical): No    Lack of Transportation (Non-Medical): No  Physical Activity: Inactive (03/28/2022)   Exercise Vital Sign    Days of Exercise per Week: 0 days    Minutes of Exercise per Session: 0 min  Stress: Stress Concern Present (03/28/2022)   [place]    Feeling of Stress : To some extent  Social Connections: Socially Isolated (03/28/2022)   Social Connection and Isolation Panel [NHANES]    Frequency of Communication with Friends and Family: More than three times a week    Frequency of Social Gatherings with Friends and Family: Three times a week    Attends Religious Services: Never    Active Member of Clubs or Organizations: No    Attends Archivist Meetings: Never    Marital Status: Divorced    Activities of Daily Living    03/28/2022    4:22 PM  In your present state of health, do you have any difficulty performing the following activities:  Hearing? 0  Vision? 0  Difficulty concentrating or making decisions? 0  Walking or climbing stairs? 1  Comment climbing stairs are difficult  Dressing or bathing? 0  Doing errands, shopping? 0  Preparing Food and eating ? N  Using the Toilet? N  In the past six months, have you accidently leaked urine? Y  Comment currently on treatment  Do you have problems with loss of bowel control? N  Managing your Medications? N  Managing your Finances? N  Housekeeping or managing your Housekeeping? 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   Depression Screen    03/28/2022    4:10 PM 05/18/2021   10:50 AM 03/01/2021    1:17 PM 05/29/2018    2:19 PM 02/12/2018   10:43 AM 05/12/2017   12:18 PM 11/18/2016   11:16 AM  PHQ 2/9 Scores  PHQ - 2 Score 4 0 0 0 1 0 0  PHQ- 9 Score 4    2       Fall Risk    03/28/2022    4:10 PM 02/04/2022    1:29 PM 08/04/2021    9:12 AM 05/18/2021   10:49 AM 03/01/2021    1:17 PM  [place] in the past year? 0 0 0 0 0  Number falls in past yr: 0 0 0 0 0  Injury with Fall? 0 0 0 0 0  Risk for fall due to : No Fall Risks No Fall Risks No Fall Risks;History of fall(s) No Fall Risks No Fall Risks  Follow up Falls evaluation completed Falls evaluation completed Falls prevention discussed Falls prevention discussed;Falls evaluation completed Falls prevention discussed;Falls evaluation completed     Objective:   BP 114/68 (BP Location: Right Arm, Patient Position: Sitting, Cuff Size: Large)   Pulse 77   Ht 5\' 4"  (1.626 m)   Wt 232 lb (105.2 kg)   SpO2 97%   BMI 39.82 kg/m   Last Weight  Most recent update: 03/28/2022  3:56 PM    Weight  105.2 kg (232 lb)             Body mass index is 39.82  kg/m.  Hearing/Vision  Vernia did not have difficulty with hearing/understanding during the face-to-face interview Jannice did not have difficulty with her vision during the face-to-face interview Reports that she has had a formal eye exam by an eye care professional within the past year Reports that she has not had a formal hearing evaluation within the past year  Cognitive Function:    03/28/2022    4:24 PM 05/29/2018    2:27 PM  6CIT Screen  What Year? 0 points 0 points  What month? 0  points 0 points  What time? 0 points 0 points  Count back from 20 0 points 0 points  Months in reverse 2 points 0 points  Repeat phrase 4 points 2 points  Total Score 6 points 2 points    Normal Cognitive Function Screening: Yes (Normal:0-7, Significant for Dysfunction: >8)  Immunization & Health Maintenance Record Immunization History  Administered Date(s) Administered   Fluad Quad(high Dose 65+) 09/15/2020   Influenza Nasal 11/05/2010   Influenza Split 09/26/2011, 11/18/2013, 10/14/2014, 09/21/2015, 10/10/2016   Influenza Whole 10/07/2008, 09/10/2009, 11/10/2012   Influenza, High Dose Seasonal PF 11/13/2017   Influenza,Quad,Nasal, Live 11/05/2010   Influenza,inj,Quad PF,6+ Mos 11/18/2013, 10/14/2014, 09/21/2015   Influenza-Unspecified 10/10/2016   PFIZER(Purple Top)SARS-COV-2 Vaccination 02/14/2019, 03/12/2019, 10/07/2019   Pfizer Covid-19 Vaccine Bivalent Booster 59yrs & up 10/10/2020   Pneumococcal Conjugate-13 07/28/2014   Pneumococcal Polysaccharide-23 10/31/2012, 05/16/2019   Td 01/11/2004   Tdap 01/11/2004, 04/15/2013   Zoster Recombinat (Shingrix) 07/25/2017, 10/17/2017   Zoster, Live 05/22/2012, 07/25/2017, 10/17/2017    Health Maintenance  Topic Date Due   INFLUENZA VACCINE  04/10/2022 (Originally 08/10/2021)   COVID-19 Vaccine (5 - 2023-24 season) 02/21/2023 (Originally 09/10/2021)   MAMMOGRAM  05/07/2022   Medicare Annual Wellness (AWV)  03/28/2023   DTaP/Tdap/Td (4 - Td or Tdap) 04/16/2023   Fecal DNA (Cologuard)  02/22/2025   Pneumonia Vaccine 63+ Years old  Completed   DEXA SCAN  Completed   Hepatitis C Screening  Completed   Zoster Vaccines- Shingrix  Completed   HPV VACCINES  Aged Out   COLONOSCOPY (Pts 45-20yrs Insurance coverage will need to be confirmed)  Discontinued       Assessment  This is a routine wellness examination for [employer].  Health Maintenance: Due or Overdue There are no preventive care reminders to display for this  patient.   [name] does not need a referral for Community Assistance: Care Management:   no Social Work:    no Prescription Assistance:  no Nutrition/Diabetes Education:  no   Plan:  Personalized Goals  Goals Addressed               This Visit's Progress     Patient Stated (pt-stated)        Patient stated that she would like loose weight and to be able get her knee replacements.        Personalized Health Maintenance & Screening Recommendations  Screening mammography Bone densitometry screening Influenza vaccine-declines  Lung Cancer Screening Recommended: no (Low Dose CT Chest recommended if Age 16-80 years, 30 pack-year currently smoking OR have quit w/in past 15 years) Hepatitis C Screening recommended: no HIV Screening recommended: no  Advanced Directives: Written information was not given per the patient's request.  Referrals & Orders Orders Placed This Encounter  Procedures   DEXAScan   Mammogram 3D SCREEN BREAST BILATERAL    Follow-up Plan Follow-up with Hali Marry, MD as planned Medicare wellness visit in  one year.  AVS printed and given to patient.   I have personally reviewed and noted the following in the patient's chart:   Medical and social history Use of alcohol, tobacco or illicit drugs  Current medications and supplements Functional ability and status Nutritional status Physical activity Advanced directives List of other physicians Hospitalizations, surgeries, and ER visits in previous 12 months Vitals Screenings to include cognitive, depression, and falls Referrals and appointments  In addition, I have reviewed and discussed with patient certain preventive protocols, quality metrics, and best practice recommendations. A written personalized care plan for preventive services as well as general preventive health recommendations were provided to patient.     Tinnie Gens, RN BSN  03/28/2022

## 2022-03-29 ENCOUNTER — Encounter: Payer: Self-pay | Admitting: Family Medicine

## 2022-04-01 NOTE — Telephone Encounter (Signed)
Would this be something you would prescribe for her?

## 2022-04-11 DIAGNOSIS — H52201 Unspecified astigmatism, right eye: Secondary | ICD-10-CM | POA: Diagnosis not present

## 2022-04-11 DIAGNOSIS — Z961 Presence of intraocular lens: Secondary | ICD-10-CM | POA: Diagnosis not present

## 2022-04-11 DIAGNOSIS — H524 Presbyopia: Secondary | ICD-10-CM | POA: Diagnosis not present

## 2022-04-12 ENCOUNTER — Other Ambulatory Visit: Payer: Self-pay | Admitting: Family Medicine

## 2022-04-12 DIAGNOSIS — I10 Essential (primary) hypertension: Secondary | ICD-10-CM

## 2022-04-19 DIAGNOSIS — M170 Bilateral primary osteoarthritis of knee: Secondary | ICD-10-CM | POA: Diagnosis not present

## 2022-04-25 ENCOUNTER — Ambulatory Visit (INDEPENDENT_AMBULATORY_CARE_PROVIDER_SITE_OTHER): Payer: Medicare Other | Admitting: Family Medicine

## 2022-04-25 ENCOUNTER — Encounter: Payer: Self-pay | Admitting: Family Medicine

## 2022-04-25 VITALS — BP 122/81 | HR 79 | Ht 64.0 in | Wt 235.0 lb

## 2022-04-25 DIAGNOSIS — L081 Erythrasma: Secondary | ICD-10-CM | POA: Diagnosis not present

## 2022-04-25 DIAGNOSIS — R21 Rash and other nonspecific skin eruption: Secondary | ICD-10-CM

## 2022-04-25 DIAGNOSIS — R5383 Other fatigue: Secondary | ICD-10-CM

## 2022-04-25 DIAGNOSIS — Z6841 Body Mass Index (BMI) 40.0 and over, adult: Secondary | ICD-10-CM | POA: Diagnosis not present

## 2022-04-25 DIAGNOSIS — M170 Bilateral primary osteoarthritis of knee: Secondary | ICD-10-CM

## 2022-04-25 DIAGNOSIS — R635 Abnormal weight gain: Secondary | ICD-10-CM | POA: Diagnosis not present

## 2022-04-25 DIAGNOSIS — R7309 Other abnormal glucose: Secondary | ICD-10-CM | POA: Diagnosis not present

## 2022-04-25 DIAGNOSIS — Z7689 Persons encountering health services in other specified circumstances: Secondary | ICD-10-CM | POA: Insufficient documentation

## 2022-04-25 DIAGNOSIS — I10 Essential (primary) hypertension: Secondary | ICD-10-CM

## 2022-04-25 MED ORDER — CLINDAMYCIN PHOSPHATE 1 % EX GEL: Freq: Two times a day (BID) | CUTANEOUS | 5 refills | Status: DC

## 2022-04-25 MED ORDER — TOPIRAMATE 25 MG PO TABS: ORAL_TABLET | ORAL | 1 refills | Status: DC

## 2022-04-25 MED ORDER — DIPHENOXYLATE-ATROPINE 2.5-0.025 MG PO TABS: ORAL_TABLET | ORAL | 0 refills | Status: DC

## 2022-04-25 NOTE — Assessment & Plan Note (Signed)
Well controlled. Continue current regimen. Follow up in  6 mo  

## 2022-04-25 NOTE — Progress Notes (Signed)
Established Patient Office Visit  Subjective   Patient ID: Donna Sexton, female    DOB: 1950-08-30  Age: 72 y.o. MRN: [id number]  Chief Complaint  Patient presents with   Hypertension    HPI  Hypertension- Pt denies chest pain, SOB, dizziness, or heart palpitations.  Taking meds as directed w/o problems.  Denies medication side effects.    She is frustrated with her weight and really needs to lose about 30 lbs so that she can have a knee replacement surgery.  BMI is now 40. She was about to lose some weight on her own but then started stress eating and has gained some back. She recently got shots in her knees and they are feeling some better.   Needs refill on clinda gell  Still feeling really fatigued. Hard to get going and stay active.      ROS    Objective:     BP 122/81   Pulse 79   Ht 5\' 4"  (1.626 m)   Wt 235 lb (106.6 kg)   SpO2 98%   BMI 40.34 kg/m    Physical Exam Vitals and nursing note reviewed.  Constitutional:      Appearance: She is well-developed.  HENT:     Head: Normocephalic and atraumatic.  Cardiovascular:     Rate and Rhythm: Normal rate and regular rhythm.     Heart sounds: Normal heart sounds.  Pulmonary:     Effort: Pulmonary effort is normal.     Breath sounds: Normal breath sounds.  Skin:    General: Skin is warm and dry.  Neurological:     Mental Status: She is alert and oriented to person, place, and time.  Psychiatric:        Behavior: Behavior normal.      No results found for any visits on 04/25/22.    The 10-year ASCVD risk score (Arnett DK, et al., 2019) is: 15%    Assessment & Plan:   Problem List Items Addressed This Visit       Cardiovascular and Mediastinum   Essential hypertension (Chronic)    Well controlled. Continue current regimen. Follow up in  6 mo         Musculoskeletal and Integument   Primary osteoarthritis of both knees     Other   Encounter for weight management    Visit #:  1 Starting Weight: 235 lbs   Current weight: Previous weight: Change in weight: Goal weight: under 200 lbs, BMI under 35 for surgery  Dietary goals: continue to work on healthy diet and exercise.  Exercise goals: Medication: start topamax, discussed potential side effects.   Follow-up and referrals: 4-6 weeks       Other Visit Diagnoses     Abnormal weight gain    -  Primary   Relevant Orders   CBC   Hemoglobin A1c   B12   Erythrasma       Relevant Medications   clindamycin (CLINDAGEL) 1 % gel   Rash of groin       Relevant Medications   clindamycin (CLINDAGEL) 1 % gel   BMI 40.0-44.9, adult       Relevant Orders   CBC   Hemoglobin A1c   B12   Other fatigue       Relevant Orders   CBC   Hemoglobin A1c   B12       Return in about 6 weeks (around 06/06/2022) for weight management .  Christofer Shen, MD  

## 2022-04-25 NOTE — Assessment & Plan Note (Signed)
Visit #: 1 Starting Weight: 235 lbs   Current weight: Previous weight: Change in weight: Goal weight: under 200 lbs, BMI under 35 for surgery  Dietary goals: continue to work on healthy diet and exercise.  Exercise goals: Medication: start topamax, discussed potential side effects.   Follow-up and referrals: 4-6 weeks

## 2022-04-26 LAB — CBC
HCT: 38.9 % (ref 35.0–45.0)
Hemoglobin: 13.3 g/dL (ref 11.7–15.5)
MCH: 32.3 pg (ref 27.0–33.0)
MCHC: 34.2 g/dL (ref 32.0–36.0)
MCV: 94.4 fL (ref 80.0–100.0)
MPV: 9.6 fL (ref 7.5–12.5)
Platelets: 256 10*3/uL (ref 140–400)
RBC: 4.12 10*6/uL (ref 3.80–5.10)
RDW: 11.6 % (ref 11.0–15.0)
WBC: 8.3 10*3/uL (ref 3.8–10.8)

## 2022-04-26 LAB — HEMOGLOBIN A1C
Hgb A1c MFr Bld: 5.7 % of total Hgb — ABNORMAL HIGH (ref <5.7)
Mean Plasma Glucose: 117 mg/dL
eAG (mmol/L): 6.5 mmol/L

## 2022-04-26 LAB — VITAMIN B12: Vitamin B-12: 585 pg/mL (ref 200–1100)

## 2022-04-26 NOTE — Progress Notes (Signed)
Hi Donna Sexton, A1c did trend upward at 5.7 compared to last year at 5.3.  It is in the prediabetes range.  So just really encouraged to work on healthy food choices cutting back on carbohydrates and sweets and foods that have more sugar such as condiments like ketchup etc.  Hemoglobin looks great this time.  12 also looks great.  Plan will be to recheck that A1c again in 6 months to make sure that it is not trending up and hopefully actually looks better.

## 2022-05-04 ENCOUNTER — Other Ambulatory Visit: Payer: Self-pay | Admitting: Family Medicine

## 2022-05-09 ENCOUNTER — Other Ambulatory Visit: Payer: Self-pay | Admitting: Family Medicine

## 2022-06-08 ENCOUNTER — Ambulatory Visit (INDEPENDENT_AMBULATORY_CARE_PROVIDER_SITE_OTHER): Payer: Medicare Other | Admitting: Family Medicine

## 2022-06-08 ENCOUNTER — Encounter: Payer: Self-pay | Admitting: Family Medicine

## 2022-06-08 VITALS — BP 110/65 | HR 81 | Ht 64.0 in | Wt 240.0 lb

## 2022-06-08 DIAGNOSIS — I10 Essential (primary) hypertension: Secondary | ICD-10-CM | POA: Diagnosis not present

## 2022-06-08 DIAGNOSIS — R635 Abnormal weight gain: Secondary | ICD-10-CM

## 2022-06-08 DIAGNOSIS — R7302 Impaired glucose tolerance (oral): Secondary | ICD-10-CM

## 2022-06-08 DIAGNOSIS — Z7689 Persons encountering health services in other specified circumstances: Secondary | ICD-10-CM | POA: Diagnosis not present

## 2022-06-08 MED ORDER — TOPIRAMATE 50 MG PO TABS: 50.0000 mg | ORAL_TABLET | Freq: Two times a day (BID) | ORAL | 0 refills | Status: DC

## 2022-06-08 MED ORDER — AMLODIPINE BESYLATE 2.5 MG PO TABS: 2.5000 mg | ORAL_TABLET | Freq: Every day | ORAL | 1 refills | Status: DC

## 2022-06-08 MED ORDER — METFORMIN HCL ER 500 MG PO TB24
500.0000 mg | ORAL_TABLET | Freq: Every day | ORAL | 0 refills | Status: DC
Start: 2022-06-08 — End: 2022-07-19

## 2022-06-08 NOTE — Progress Notes (Signed)
   Established Patient Office Visit  Subjective   Patient ID: Donna Sexton, female    DOB: [birth year]  Age: 72 y.o. MRN: [id number]  Chief Complaint  Patient presents with   Weight Check    HPI  Follow-up for weight management.  She is trying to get her BMI under 40 so that she can have joint surgery.  She is up to 50 mg twice a day on the topiramate.  She does feel like it has been helpful.  She has been trying to eat better. She is snacking onnuts.  She has really but back on her diet soda and doesn't even drink it every day not. She is trying to do more int the house but hasn't been able to walk.      ROS    Objective:     BP 110/65   Pulse 81   Ht 5\' 4"  (1.626 m)   Wt 240 lb (108.9 kg)   SpO2 94%   BMI 41.20 kg/m    Physical Exam Vitals and nursing note reviewed.  Constitutional:      Appearance: She is well-developed.  HENT:     Head: Normocephalic and atraumatic.  Cardiovascular:     Rate and Rhythm: Normal rate and regular rhythm.     Heart sounds: Normal heart sounds.  Pulmonary:     Effort: Pulmonary effort is normal.     Breath sounds: Normal breath sounds.  Skin:    General: Skin is warm and dry.  Neurological:     Mental Status: She is alert and oriented to person, place, and time.  Psychiatric:        Behavior: Behavior normal.      No results found for any visits on 06/08/22.    The 10-year ASCVD risk score (Arnett DK, et al., 2019) is: 12.3%    Assessment & Plan:   Problem List Items Addressed This Visit       Cardiovascular and Mediastinum   Essential hypertension (Chronic)    BP at goal. Due for refills.       Relevant Medications   amLODipine (NORVASC) 2.5 MG tablet     Endocrine   Impaired glucose tolerance (Chronic)   Relevant Medications   metFORMIN (GLUCOPHAGE-XR) 500 MG 24 hr tablet     Other   Encounter for weight management    Visit #: 1 Starting Weight: 235 lbs    Current weight: 240 lbs  Previous weight:  235 lbs Change in weight: Up 5 lbs  Goal weight: under 200 lbs, BMI under 35 for surgery  Dietary goals: continue to work on healthy diet and exercise. Has cut way back on Diet soda.   Exercise goals: use foot pedals a few days per week.   Medication: RF topamax 50mg  bid, and add metformin 500mg  ER QD Follow-up and referrals: 4-6 weeks      Other Visit Diagnoses     Abnormal weight gain    -  Primary       Return in about 5 weeks (around 07/13/2022) for Weight mgt.   I spent 25 minutes on the day of the encounter to include pre-visit record review, face-to-face time with the patient and post visit ordering of test.   Nani Gasser, MD

## 2022-06-08 NOTE — Assessment & Plan Note (Addendum)
Visit #: 1 Starting Weight: 235 lbs    Current weight: 240 lbs  Previous weight: 235 lbs Change in weight: Up 5 lbs  Goal weight: under 200 lbs, BMI under 35 for surgery  Dietary goals: continue to work on healthy diet and exercise. Has cut way back on Diet soda.   Exercise goals: use foot pedals a few days per week.   Medication: RF topamax 50mg  bid, and add metformin 500mg  ER QD Follow-up and referrals: 4-6 weeks

## 2022-06-08 NOTE — Assessment & Plan Note (Signed)
BP at goal. Due for refills.

## 2022-06-17 ENCOUNTER — Other Ambulatory Visit: Payer: Self-pay | Admitting: Family Medicine

## 2022-06-27 ENCOUNTER — Other Ambulatory Visit: Payer: Self-pay | Admitting: Family Medicine

## 2022-07-19 ENCOUNTER — Encounter: Payer: Self-pay | Admitting: Family Medicine

## 2022-07-19 ENCOUNTER — Ambulatory Visit (INDEPENDENT_AMBULATORY_CARE_PROVIDER_SITE_OTHER): Payer: Medicare Other | Admitting: Family Medicine

## 2022-07-19 ENCOUNTER — Ambulatory Visit: Payer: Medicare Other

## 2022-07-19 VITALS — BP 128/78 | HR 91 | Ht 64.0 in | Wt 238.0 lb

## 2022-07-19 DIAGNOSIS — R1030 Lower abdominal pain, unspecified: Secondary | ICD-10-CM | POA: Diagnosis not present

## 2022-07-19 DIAGNOSIS — M7731 Calcaneal spur, right foot: Secondary | ICD-10-CM | POA: Diagnosis not present

## 2022-07-19 DIAGNOSIS — M79671 Pain in right foot: Secondary | ICD-10-CM

## 2022-07-19 DIAGNOSIS — M19071 Primary osteoarthritis, right ankle and foot: Secondary | ICD-10-CM | POA: Diagnosis not present

## 2022-07-19 DIAGNOSIS — M25571 Pain in right ankle and joints of right foot: Secondary | ICD-10-CM | POA: Diagnosis not present

## 2022-07-19 DIAGNOSIS — M85871 Other specified disorders of bone density and structure, right ankle and foot: Secondary | ICD-10-CM | POA: Diagnosis not present

## 2022-07-19 LAB — CBC WITH DIFFERENTIAL/PLATELET
Basophils Relative: 1 %
MCHC: 34.5 g/dL (ref 32.0–36.0)
MPV: 9.8 fL (ref 7.5–12.5)
Monocytes Relative: 10.8 %
Neutro Abs: 5504 cells/uL (ref 1500–7800)
Neutrophils Relative %: 68.8 %
RBC: 4.01 10*6/uL (ref 3.80–5.10)
RDW: 12 % (ref 11.0–15.0)

## 2022-07-19 NOTE — Progress Notes (Signed)
Established Patient Office Visit  Subjective   Patient ID: Donna Sexton, female    DOB: 06/29/1950  Age: 72 y.o. MRN: [id number]  Chief Complaint  Patient presents with   Weight Check    HPI  She did stop the metformin and the topiramate which she been using to help promote weight loss she felt like it was not helpful and it was causing GI upset.  Reports that she has been having intermittent stomach pain for about 2 weeks.  So she stopped the medication probably about 6 7 days ago but she still having lower abdominal pain.  She says it almost always in the evenings her stomach will start to hurt and she feels really bloated.  She denies any constipation or change in bowels if anything there loose but that is not new.  No nausea or vomiting.  She has had a few more headaches usually in the evenings as well.  No blood in the stool.  She has been dealing with some hemorrhoids lately which is new.   She is also been having a burning pain over the right lateral foot.  Says it has been there for probably a couple of months at this point.  She denies any known trauma or injury.  She says when it would start bothering her she would tend to go barefoot more and that actually seemed to help.  She says the more that she wear shoes the more painful it seems to be.  Now she is having discomfort just walking on it and even just standing up and putting pressure on it it is mostly over the fourth and fifth metatarsals.  She still has been trying to eat less.    ROS    Objective:     BP 128/78   Pulse 91   Ht 5\' 4"  (1.626 m)   Wt 238 lb (108 kg)   SpO2 96%   BMI 40.85 kg/m    Physical Exam Vitals and nursing note reviewed.  Constitutional:      Appearance: She is well-developed.  HENT:     Head: Normocephalic and atraumatic.     Right Ear: External ear normal.     Left Ear: External ear normal.  Cardiovascular:     Rate and Rhythm: Normal rate and regular rhythm.     Heart sounds:  Normal heart sounds.  Pulmonary:     Effort: Pulmonary effort is normal.     Breath sounds: Normal breath sounds.  Abdominal:     General: Bowel sounds are normal. There is no distension.     Palpations: Abdomen is soft.     Tenderness: There is no abdominal tenderness. There is no guarding.  Musculoskeletal:     Comments: Right foot with no bruising or swelling.  She is tender over the distal half of the fifth metatarsal and a little tender over the mid fourth metatarsal.  Normal range of motion of her toes and ankle.  Skin:    General: Skin is warm and dry.  Neurological:     Mental Status: She is alert and oriented to person, place, and time.  Psychiatric:        Behavior: Behavior normal.      No results found for any visits on 07/19/22.    The 10-year ASCVD risk score (Arnett DK, et al., 2019) is: 16.3%    Assessment & Plan:   Problem List Items Addressed This Visit   None Visit Diagnoses  Lower abdominal pain    -  Primary   Relevant Orders   CBC with Differential/Platelet   COMPLETE METABOLIC PANEL WITH GFR   Lipase   Right foot pain       Relevant Orders   DG Foot Complete Right      Lower abdominal pain in the right and left lower quadrants.  Mostly occurs in the evening associated with some bloating.  I do think this is probably being triggered directly by her diet especially since she stopped both of the new medications almost a week ago and has not noticed any significant improvement in symptoms.  She is asymptomatic and nontender on exam this morning.  We did discuss maybe avoiding nuts since she had recently added that to her diet.  We could see if that is helpful.  Will get labs just to make sure that her white blood cell count is not elevated.  Right lateral foot pain over the fourth and fifth metatarsals will get x-ray today to rule out fracture or stress fracture.  She might benefit from being placed in a postop shoe for couple weeks to rest the  foot.  Will call with results once available.  No follow-ups on file.    Nani Gasser, MD

## 2022-07-20 ENCOUNTER — Telehealth: Payer: Self-pay | Admitting: Family Medicine

## 2022-07-20 DIAGNOSIS — M170 Bilateral primary osteoarthritis of knee: Secondary | ICD-10-CM | POA: Diagnosis not present

## 2022-07-20 LAB — CBC WITH DIFFERENTIAL/PLATELET
Absolute Monocytes: 864 cells/uL (ref 200–950)
Basophils Absolute: 80 cells/uL (ref 0–200)
Eosinophils Absolute: 312 cells/uL (ref 15–500)
Eosinophils Relative: 3.9 %
HCT: 37.4 % (ref 35.0–45.0)
Hemoglobin: 12.9 g/dL (ref 11.7–15.5)
Lymphs Abs: 1240 cells/uL (ref 850–3900)
MCH: 32.2 pg (ref 27.0–33.0)
MCV: 93.3 fL (ref 80.0–100.0)
Platelets: 253 10*3/uL (ref 140–400)
Total Lymphocyte: 15.5 %
WBC: 8 10*3/uL (ref 3.8–10.8)

## 2022-07-20 LAB — COMPLETE METABOLIC PANEL WITH GFR
AG Ratio: 2 (calc) (ref 1.0–2.5)
ALT: 14 U/L (ref 6–29)
AST: 13 U/L (ref 10–35)
Albumin: 4.2 g/dL (ref 3.6–5.1)
Alkaline phosphatase (APISO): 72 U/L (ref 37–153)
BUN: 8 mg/dL (ref 7–25)
CO2: 29 mmol/L (ref 20–32)
Calcium: 9.3 mg/dL (ref 8.6–10.4)
Chloride: 97 mmol/L — ABNORMAL LOW (ref 98–110)
Creat: 0.77 mg/dL (ref 0.60–1.00)
Globulin: 2.1 g/dL (calc) (ref 1.9–3.7)
Glucose, Bld: 118 mg/dL — ABNORMAL HIGH (ref 65–99)
Potassium: 4.7 mmol/L (ref 3.5–5.3)
Sodium: 134 mmol/L — ABNORMAL LOW (ref 135–146)
Total Bilirubin: 0.6 mg/dL (ref 0.2–1.2)
Total Protein: 6.3 g/dL (ref 6.1–8.1)
eGFR: 81 mL/min/{1.73_m2} (ref >=60)

## 2022-07-20 LAB — LIPASE: Lipase: 11 U/L (ref 7–60)

## 2022-07-20 NOTE — Telephone Encounter (Signed)
Patient called and cancelled appointment x-rays not available yet

## 2022-07-20 NOTE — Progress Notes (Signed)
HI Donna Sexton,  Sign of acute infection which is really reassuring.  That makes diverticulitis a little less likely.  No sign of pancreatitis and no sign of liver inflammation.  I think just focusing on stopping the nuts in your diet for now and just seeing if you notice a difference as we move into the weekend.  If there are any other food triggers that you might pinpoint then decrease that as well.  Some people have difficulty processing dairy as they get older because they do not make as much lactase which is the enzyme that breaks it down so if you have been also getting into more milk products or dairy products you might want to decrease that as well just to see if you notice any difference.

## 2022-07-20 NOTE — Telephone Encounter (Signed)
Patient called to get x-ray results and to see if she needs to cancel appointment with Dr.T Please contact patient at [phone]

## 2022-07-21 ENCOUNTER — Ambulatory Visit: Payer: Medicare Other | Admitting: Sports Medicine

## 2022-07-25 NOTE — Progress Notes (Signed)
HI Talayeh, your foot xray shows no sign of fracture.  I want to put you in a post op shoe to support your foot for 2 weeks. Can you come by and see the nurse to do that  and get fitted for the shoe?

## 2022-08-05 ENCOUNTER — Encounter: Payer: Self-pay | Admitting: Family Medicine

## 2022-08-05 ENCOUNTER — Ambulatory Visit (INDEPENDENT_AMBULATORY_CARE_PROVIDER_SITE_OTHER): Payer: Medicare Other | Admitting: Family Medicine

## 2022-08-05 VITALS — BP 128/78 | HR 85 | Ht 64.0 in | Wt 238.0 lb

## 2022-08-05 DIAGNOSIS — N3281 Overactive bladder: Secondary | ICD-10-CM

## 2022-08-05 DIAGNOSIS — I10 Essential (primary) hypertension: Secondary | ICD-10-CM

## 2022-08-05 DIAGNOSIS — R7302 Impaired glucose tolerance (oral): Secondary | ICD-10-CM

## 2022-08-05 DIAGNOSIS — I5032 Chronic diastolic (congestive) heart failure: Secondary | ICD-10-CM

## 2022-08-05 LAB — POCT GLYCOSYLATED HEMOGLOBIN (HGB A1C): Hemoglobin A1C: 5.3 % (ref 4.0–5.6)

## 2022-08-05 MED ORDER — SOLIFENACIN SUCCINATE 10 MG PO TABS
10.0000 mg | ORAL_TABLET | Freq: Every day | ORAL | 1 refills | Status: DC
Start: 2022-08-05 — End: 2022-09-19

## 2022-08-05 NOTE — Assessment & Plan Note (Signed)
Well controlled. Continue current regimen. Follow up in  6 mo  

## 2022-08-05 NOTE — Assessment & Plan Note (Signed)
Sign of volume overload on exam today weight is stable from 3 weeks ago.

## 2022-08-05 NOTE — Assessment & Plan Note (Addendum)
A1C looks better today but she is still craving sweats.  Still encouraged her to work on cutting back on portions and trying to decrease amount of sweets and carbs in her diet.  She would like to see if her insurance will cover any of the GLP-1's.  Discussed that they often do not cover it for prediabetes only diabetes.

## 2022-08-05 NOTE — Assessment & Plan Note (Addendum)
Currently on Ditropan for symptom control, but she feels like it is really not working well.  Would like to try different 1 to see if it is more effective.  Will switch to Vesicare.  Will try 10 mg dose.  New prescription sent to pharmacy.  I think she will also like the ease of once daily dosing instead of twice a day.

## 2022-08-05 NOTE — Progress Notes (Signed)
Established Patient Office Visit  Subjective   Patient ID: Donna Sexton, female    DOB: 1950/12/13  Age: 72 y.o. MRN: [id number]  Chief Complaint  Patient presents with   Hypertension    HPI Hypertension- Pt denies chest pain, SOB, dizziness, or heart palpitations.  Taking meds as directed w/o problems.  Denies medication side effects.    F/U  CHF - NO CPR or swelling.   Impaired fasting glucose-no increased thirst or urination. No symptoms consistent with hypoglycemia.  Did follow-up with sports medicine for her bilateral knee pain.  She is still really strugging with inc appetite and eating too much.  Her weight is up about 5 lbs.    F/U [facility] -says ditropan isn't  working well    ROS    Objective:     BP 128/78   Pulse 85   Ht 5\' 4"  (1.626 m)   Wt 238 lb (108 kg)   SpO2 95%   BMI 40.85 kg/m    Physical Exam Vitals and nursing note reviewed.  Constitutional:      Appearance: She is well-developed.  HENT:     Head: Normocephalic and atraumatic.  Cardiovascular:     Rate and Rhythm: Normal rate and regular rhythm.     Heart sounds: Normal heart sounds.  Pulmonary:     Effort: Pulmonary effort is normal.     Breath sounds: Normal breath sounds.  Skin:    General: Skin is warm and dry.  Neurological:     Mental Status: She is alert and oriented to person, place, and time.  Psychiatric:        Behavior: Behavior normal.      Results for orders placed or performed in visit on 08/05/22  POCT glycosylated hemoglobin (Hb A1C)  Result Value Ref Range   Hemoglobin A1C 5.3 4.0 - 5.6 %   HbA1c POC (<> result, manual entry)     HbA1c, POC (prediabetic range)     HbA1c, POC (controlled diabetic range)        The 10-year ASCVD risk score (Arnett DK, et al., 2019) is: 16.3%    Assessment & Plan:   Problem List Items Addressed This Visit       Cardiovascular and Mediastinum   Essential hypertension - Primary (Chronic)    Well controlled. Continue  current regimen. Follow up in  6 mo       Chronic heart failure with preserved ejection fraction (HCC)    Sign of volume overload on exam today weight is stable from 3 weeks ago.        Endocrine   Impaired glucose tolerance (Chronic)    A1C looks better today but she is still craving sweats.  Still encouraged her to work on cutting back on portions and trying to decrease amount of sweets and carbs in her diet.  She would like to see if her insurance will cover any of the GLP-1's.  Discussed that they often do not cover it for prediabetes only diabetes.      Relevant Orders   POCT glycosylated hemoglobin (Hb A1C) (Completed)     Genitourinary   Overactive bladder (Chronic)    Currently on Ditropan for symptom control, but she feels like it is really not working well.  Would like to try different 1 to see if it is more effective.  Will switch to Vesicare.  Will try 10 mg dose.  New prescription sent to pharmacy.  I think she will also  like the ease of once daily dosing instead of twice a day.      Relevant Medications   solifenacin (VESICARE) 10 MG tablet    Return in about 4 months (around 12/06/2022).    Nani Gasser, MD

## 2022-08-26 ENCOUNTER — Ambulatory Visit: Payer: Medicare Other | Admitting: Family Medicine

## 2022-09-01 ENCOUNTER — Telehealth (INDEPENDENT_AMBULATORY_CARE_PROVIDER_SITE_OTHER): Payer: Medicare Other | Admitting: Psychiatry

## 2022-09-01 ENCOUNTER — Encounter: Payer: Self-pay | Admitting: Psychiatry

## 2022-09-01 DIAGNOSIS — F319 Bipolar disorder, unspecified: Secondary | ICD-10-CM

## 2022-09-01 DIAGNOSIS — F5105 Insomnia due to other mental disorder: Secondary | ICD-10-CM

## 2022-09-01 DIAGNOSIS — F411 Generalized anxiety disorder: Secondary | ICD-10-CM

## 2022-09-01 MED ORDER — DOXEPIN HCL 10 MG PO CAPS: 10.0000 mg | ORAL_CAPSULE | Freq: Every day | ORAL | 0 refills | Status: DC

## 2022-09-01 MED ORDER — DIVALPROEX SODIUM ER 500 MG PO TB24
1500.0000 mg | ORAL_TABLET | Freq: Every day | ORAL | 1 refills | Status: DC
Start: 2022-09-01 — End: 2023-10-05
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
## 2022-09-13 ENCOUNTER — Other Ambulatory Visit: Payer: Self-pay | Admitting: Family Medicine

## 2022-09-19 ENCOUNTER — Ambulatory Visit (INDEPENDENT_AMBULATORY_CARE_PROVIDER_SITE_OTHER): Payer: Medicare Other | Admitting: Family Medicine

## 2022-09-19 VITALS — BP 127/80 | HR 83 | Wt 247.0 lb

## 2022-09-19 DIAGNOSIS — R151 Fecal smearing: Secondary | ICD-10-CM | POA: Diagnosis not present

## 2022-09-19 DIAGNOSIS — Z23 Encounter for immunization: Secondary | ICD-10-CM | POA: Diagnosis not present

## 2022-09-19 DIAGNOSIS — I5032 Chronic diastolic (congestive) heart failure: Secondary | ICD-10-CM

## 2022-09-19 DIAGNOSIS — R062 Wheezing: Secondary | ICD-10-CM | POA: Diagnosis not present

## 2022-09-19 NOTE — Patient Instructions (Addendum)
Recommend a trial of Claritin 10 mg daily to see if that helps with the runny nose and cough and congestion. Start back to weighing yourself daily.  If you go up more than 2 or 3 pounds in a short period of time then you may be getting fluid overloaded.   limit your fluids to no more than 50 ounces daily. Also make sure you are taking your furosemide every day.  If you need to repeat the dose in the afternoon because of swelling then please do so.  If your weight is not coming down a little bit this week after limiting your fluids then please let me know.

## 2022-09-19 NOTE — Progress Notes (Signed)
Established Patient Office Visit  Subjective   Patient ID: Donna Sexton, female    DOB: May 23, 1950  Age: 72 y.o. MRN: [id number]  No chief complaint on file.   HPI  Her cardiologist is retiring and they are assigning her a new 1.  She has an upcoming appointment on September 17 with Dr. Seward Speck.    Follow-up congestive heart failure-she had some significant swelling last week but she had been eating bologna sandwiches and eating more crackers than usual she said that she started paying attention to the salt intake and the swelling has gone down some particularly around her ankles.  She is interested in potentially being involved in a research study and had some questions about that today.  He has noticed some wheezing recently.  Seems to just be intermittent she has had a little bit of a runny nose mostly just in the evenings.  She also mention that she has been having some intermittent stool incontinence and does not even realize that it is happening in.  In fact she went to her hairdresser the other day and when she got home realized that she had had some stool that she had passed.     ROS    Objective:     BP 127/80   Pulse 83   Wt 247 lb (112 kg)   SpO2 95%   BMI 42.40 kg/m    Physical Exam Vitals and nursing note reviewed.  Constitutional:      Appearance: Normal appearance.  HENT:     Head: Normocephalic and atraumatic.  Eyes:     Conjunctiva/sclera: Conjunctivae normal.  Cardiovascular:     Rate and Rhythm: Normal rate and regular rhythm.  Pulmonary:     Effort: Pulmonary effort is normal.     Breath sounds: Normal breath sounds.  Skin:    General: Skin is warm and dry.  Neurological:     Mental Status: She is alert.  Psychiatric:        Mood and Affect: Mood normal.      No results found for any visits on 09/19/22.    The 10-year ASCVD risk score (Arnett DK, et al., 2019) is: 16.1%    Assessment & Plan:   Problem List Items  Addressed This Visit       Cardiovascular and Mediastinum   Chronic heart failure with preserved ejection fraction (HCC)    Start back to weighing yourself daily.  If you go up more than 2 or 3 pounds in a short period of time then you may be getting fluid overloaded.   limit your fluids to no more than 50 ounces daily. Also make sure you are taking your furosemide every day.  If you need to repeat the dose in the afternoon because of swelling then please do so.      Other Visit Diagnoses     Fecal smearing    -  Primary   Relevant Orders   Ambulatory referral to Gastroenterology   Wheezing       Encounter for immunization       Relevant Orders   Flu Vaccine Trivalent High Dose (Fluad) (Completed)      Wheezing-chest is clear on exam today I did not hear any additional noises.  She has had some runny nose and postnasal drip so we did discuss maybe a trial of an antihistamine.  Her weight is up since I last saw her I do think some of this is probably  a little excess volume overload so reminded her to make sure that she is fluid restricting and no more than 50 ounces a day and continuing to take her furosemide and weighing herself daily she reports that she has not been as consistent with weighing herself.  Fecal incontinence-we will go ahead and refer to GI for further evaluation she might do well with some pelvic PT for this issue but we will let them address further.  She tends to have more soft stools denies any recent constipation or abdominal pain.  Return in about 4 months (around 01/19/2023) for fluid, swelling.    Nani Gasser, MD

## 2022-09-19 NOTE — Assessment & Plan Note (Addendum)
Start back to weighing yourself daily.  If you go up more than 2 or 3 pounds in a short period of time then you may be getting fluid overloaded.   limit your fluids to no more than 50 ounces daily. Also make sure you are taking your furosemide every day.  If you need to repeat the dose in the afternoon because of swelling then please do so.

## 2022-09-20 ENCOUNTER — Ambulatory Visit: Payer: Medicare Other | Admitting: Podiatry

## 2022-09-21 DIAGNOSIS — J039 Acute tonsillitis, unspecified: Secondary | ICD-10-CM | POA: Diagnosis not present

## 2022-09-21 DIAGNOSIS — J029 Acute pharyngitis, unspecified: Secondary | ICD-10-CM | POA: Diagnosis not present

## 2022-09-21 DIAGNOSIS — J0101 Acute recurrent maxillary sinusitis: Secondary | ICD-10-CM | POA: Diagnosis not present

## 2022-09-27 ENCOUNTER — Other Ambulatory Visit: Payer: Self-pay | Admitting: Family Medicine

## 2022-09-29 ENCOUNTER — Telehealth: Payer: Self-pay | Admitting: Family Medicine

## 2022-09-29 DIAGNOSIS — N3281 Overactive bladder: Secondary | ICD-10-CM

## 2022-09-29 MED ORDER — OXYBUTYNIN CHLORIDE 5 MG PO TABS: 5.0000 mg | ORAL_TABLET | Freq: Two times a day (BID) | ORAL | 1 refills | Status: DC

## 2022-09-29 NOTE — Telephone Encounter (Signed)
Patient called she wants to go back on her on Oxybutynin 5mg  she stopped taking

## 2022-09-29 NOTE — Addendum Note (Signed)
Addended by: Deno Etienne on: 09/29/2022 12:41 PM   Modules accepted: Orders

## 2022-09-29 NOTE — Telephone Encounter (Signed)
Medication sent.

## 2022-09-29 NOTE — Telephone Encounter (Signed)
Patient called she wants to go back on her Oxybutynin 5mg  she stopped taking vsicare 5mg  she needs a new prescription sent over [facility] [phone]

## 2022-10-03 ENCOUNTER — Ambulatory Visit
Admission: RE | Admit: 2022-10-03 | Discharge: 2022-10-03 | Disposition: A | Discharge status: Home / Self Care | Payer: Medicare Other | Source: Ambulatory Visit | Attending: Physician Assistant | Admitting: Physician Assistant

## 2022-10-03 DIAGNOSIS — Z0000 Encounter for general adult medical examination without abnormal findings: Secondary | ICD-10-CM

## 2022-10-03 DIAGNOSIS — E349 Endocrine disorder, unspecified: Secondary | ICD-10-CM | POA: Diagnosis not present

## 2022-10-03 DIAGNOSIS — Z1231 Encounter for screening mammogram for malignant neoplasm of breast: Secondary | ICD-10-CM | POA: Diagnosis not present

## 2022-10-03 DIAGNOSIS — Z780 Asymptomatic menopausal state: Secondary | ICD-10-CM

## 2022-10-03 DIAGNOSIS — M8588 Other specified disorders of bone density and structure, other site: Secondary | ICD-10-CM | POA: Diagnosis not present

## 2022-10-03 DIAGNOSIS — N958 Other specified menopausal and perimenopausal disorders: Secondary | ICD-10-CM | POA: Diagnosis not present

## 2022-10-03 NOTE — Progress Notes (Signed)
To PCP

## 2022-10-04 DIAGNOSIS — I5032 Chronic diastolic (congestive) heart failure: Secondary | ICD-10-CM | POA: Diagnosis not present

## 2022-10-04 NOTE — Progress Notes (Signed)
Hi Donna Sexton,  Your bone density test shows a score of -1.9 this is in the mildly thin bones category called osteopenia.   The current recommendation for osteopenia (mildly thin bones) treatment includes:   #1 calcium-total of 1200 mg of calcium daily.  If you eat a very calcium rich diet you may be able to obtain that without a supplement.  If not, then I recommend calcium 500 mg twice a day.  There are several products over-the-counter such as Caltrate D and Viactiv chews which are great options that contain calcium and vitamin D. #2 vitamin D-recommend 800 international units daily. #3 exercise-recommend 30 minutes of weightbearing exercise 3 days a week.  Resistance training ,such as doing bands and light weights, can be particularly helpful.

## 2022-10-04 NOTE — Progress Notes (Signed)
Normal mammogram. Follow up in 1 year.

## 2022-10-26 DIAGNOSIS — M170 Bilateral primary osteoarthritis of knee: Secondary | ICD-10-CM | POA: Diagnosis not present

## 2022-11-01 DIAGNOSIS — J029 Acute pharyngitis, unspecified: Secondary | ICD-10-CM | POA: Diagnosis not present

## 2022-11-01 DIAGNOSIS — B370 Candidal stomatitis: Secondary | ICD-10-CM | POA: Diagnosis not present

## 2022-11-07 DIAGNOSIS — M25562 Pain in left knee: Secondary | ICD-10-CM | POA: Diagnosis not present

## 2022-11-07 DIAGNOSIS — R531 Weakness: Secondary | ICD-10-CM | POA: Diagnosis not present

## 2022-11-07 DIAGNOSIS — M25561 Pain in right knee: Secondary | ICD-10-CM | POA: Diagnosis not present

## 2022-11-15 ENCOUNTER — Encounter: Payer: Self-pay | Admitting: Family Medicine

## 2022-11-15 NOTE — Telephone Encounter (Signed)
Patient scheduled.

## 2022-11-16 ENCOUNTER — Ambulatory Visit (INDEPENDENT_AMBULATORY_CARE_PROVIDER_SITE_OTHER): Payer: Medicare Other | Admitting: Family Medicine

## 2022-11-16 ENCOUNTER — Encounter: Payer: Self-pay | Admitting: Family Medicine

## 2022-11-16 VITALS — BP 131/88 | HR 85 | Temp 98.9°F | Ht 64.0 in | Wt 250.0 lb

## 2022-11-16 DIAGNOSIS — R09A2 Foreign body sensation, throat: Secondary | ICD-10-CM | POA: Diagnosis not present

## 2022-11-16 DIAGNOSIS — J029 Acute pharyngitis, unspecified: Secondary | ICD-10-CM

## 2022-11-16 NOTE — Progress Notes (Signed)
   Acute Office Visit  Subjective:     Patient ID: Donna Sexton, female    DOB: June 21, 1950, 72 y.o.   MRN: [id number]  Chief Complaint  Patient presents with   Sore Throat          HPI Patient is in today for ST.  ST started about 1.5 months ago. She was dx w/ tonsillitis and tx'd with amox.  Felt better and then develop sensation of "golfball" in her throat on the left side.  Went back to UC and dx with thrush and tx with nystatin and fluconazole.  NO fever or chills. No problems swallowing. Feels very uncomfortable at night when she is trying to rest.   ROS      Objective:    BP 131/88   Pulse 85   Temp 98.9 F (37.2 C)   Ht 5\' 4"  (1.626 m)   Wt 250 lb (113.4 kg)   SpO2 94%   BMI 42.91 kg/m    Physical Exam Constitutional:      Appearance: Normal appearance.  HENT:     Head: Normocephalic and atraumatic.     Right Ear: Tympanic membrane, ear canal and external ear normal. There is no impacted cerumen.     Left Ear: Tympanic membrane, ear canal and external ear normal. There is no impacted cerumen.     Nose: Nose normal.     Mouth/Throat:     Pharynx: Oropharynx is clear.  Eyes:     Conjunctiva/sclera: Conjunctivae normal.  Cardiovascular:     Rate and Rhythm: Normal rate and regular rhythm.  Pulmonary:     Effort: Pulmonary effort is normal.     Breath sounds: Normal breath sounds.  Musculoskeletal:     Cervical back: Neck supple. No tenderness.  Lymphadenopathy:     Cervical: No cervical adenopathy.  Skin:    General: Skin is warm and dry.  Neurological:     Mental Status: She is alert and oriented to person, place, and time.  Psychiatric:        Mood and Affect: Mood normal.     No results found for any visits on 11/16/22.      Assessment & Plan:   Problem List Items Addressed This Visit   None Visit Diagnoses     Sore throat    -  Primary   Relevant Orders   US Soft Tissue Head/Neck (NON-THYROID)   Ambulatory referral to ENT    Globus sensation       Relevant Orders   US Soft Tissue Head/Neck (NON-THYROID)   Ambulatory referral to ENT      Discussed workup including [place] and ENT referral.  No indication of infectious etiology at this point.  No evidence of thrush on exam.  She is not having difficulty swallowing.  No orders of the defined types were placed in this encounter.   No follow-ups on file.  Nani Gasser, MD

## 2022-11-22 DIAGNOSIS — M25561 Pain in right knee: Secondary | ICD-10-CM | POA: Diagnosis not present

## 2022-11-22 DIAGNOSIS — M170 Bilateral primary osteoarthritis of knee: Secondary | ICD-10-CM | POA: Diagnosis not present

## 2022-11-22 DIAGNOSIS — M25562 Pain in left knee: Secondary | ICD-10-CM | POA: Diagnosis not present

## 2022-11-22 DIAGNOSIS — R531 Weakness: Secondary | ICD-10-CM | POA: Diagnosis not present

## 2022-11-28 DIAGNOSIS — M170 Bilateral primary osteoarthritis of knee: Secondary | ICD-10-CM | POA: Diagnosis not present

## 2022-11-28 DIAGNOSIS — R531 Weakness: Secondary | ICD-10-CM | POA: Diagnosis not present

## 2022-11-28 DIAGNOSIS — M25562 Pain in left knee: Secondary | ICD-10-CM | POA: Diagnosis not present

## 2022-11-28 DIAGNOSIS — M25561 Pain in right knee: Secondary | ICD-10-CM | POA: Diagnosis not present

## 2022-12-06 ENCOUNTER — Ambulatory Visit: Payer: Medicare Other | Admitting: Family Medicine

## 2022-12-19 ENCOUNTER — Other Ambulatory Visit: Payer: Self-pay | Admitting: Family Medicine

## 2022-12-19 ENCOUNTER — Other Ambulatory Visit: Payer: Self-pay | Admitting: Psychiatry

## 2022-12-19 DIAGNOSIS — F5105 Insomnia due to other mental disorder: Secondary | ICD-10-CM

## 2022-12-22 ENCOUNTER — Other Ambulatory Visit: Payer: Self-pay | Admitting: Family Medicine

## 2022-12-23 ENCOUNTER — Other Ambulatory Visit: Payer: Self-pay | Admitting: Family Medicine

## 2022-12-23 ENCOUNTER — Other Ambulatory Visit: Payer: Self-pay

## 2022-12-23 MED ORDER — FUROSEMIDE 40 MG PO TABS: 40.0000 mg | ORAL_TABLET | Freq: Every day | ORAL | 1 refills | Status: DC

## 2022-12-23 MED ORDER — DIPHENOXYLATE-ATROPINE 2.5-0.025 MG PO TABS: ORAL_TABLET | ORAL | 2 refills | Status: DC

## 2022-12-23 NOTE — Telephone Encounter (Signed)
Copied from CRM [id number]. Topic: Clinical - Medication Refill >> Dec 22, 2022  2:37 PM Almira Coaster wrote: Most Recent Primary Care Visit:  Provider: Nani Gasser D  Department: [facility]  Visit Type: SAME DAY  Date: 11/16/2022  Medication:  promethazine 25mg , diphenoxylate-atropine (LOMOTIL) 2.5-0.025 MG, furosemide (LASIX) 40 MG tablet   Has the patient contacted their pharmacy? Yes, They faxed a request for refill to the providers office  (Agent: If no, request that the patient contact the pharmacy for the refill. If patient does not wish to contact the pharmacy document the reason why and proceed with request.) (Agent: If yes, when and what did the pharmacy advise?)  Is this the correct pharmacy for this prescription? Yes If no, delete pharmacy and type the correct one.  This is the patient's preferred pharmacy:  [facility] [place], [place] - [address] [place] [address] Phone: [phone] Fax: [phone]   Has the prescription been filled recently? No  Is the patient out of the medication? No  Has the patient been seen for an appointment in the last year OR does the patient have an upcoming appointment? Yes  Can we respond through MyChart? Yes  Agent: Please be advised that Rx refills may take up to 3 business days. We ask that you follow-up with your pharmacy.

## 2022-12-23 NOTE — Telephone Encounter (Signed)
Patient requesting rx rf of  Promethazine 25mg  ( not in current med list)  Lasix 40mg  ( historical provider )  Lomotil-last written 09/15/2022 Last OV 11/16/2022 ( sick visit)  Upcoming appt 01/19/2023

## 2022-12-30 ENCOUNTER — Other Ambulatory Visit: Payer: Self-pay | Admitting: Family Medicine

## 2022-12-30 DIAGNOSIS — I10 Essential (primary) hypertension: Secondary | ICD-10-CM

## 2022-12-30 MED ORDER — PREGABALIN 50 MG PO CAPS: 50.0000 mg | ORAL_CAPSULE | Freq: Three times a day (TID) | ORAL | 1 refills | Status: DC

## 2023-01-02 ENCOUNTER — Telehealth: Payer: Self-pay

## 2023-01-02 NOTE — Telephone Encounter (Signed)
Amlodipine 2.5mg   Refill Already sent to pharmacy on 12/30/2022

## 2023-01-02 NOTE — Telephone Encounter (Signed)
Copied from CRM [id number]. Topic: Clinical - Medication Refill >> Dec 30, 2022  3:25 PM Conni Elliot wrote: Most Recent Primary Care Visit:  Provider: Nani Gasser D  Department: [facility]  Visit Type: SAME DAY  Date: 11/16/2022  Medication: amLODipine (NORVASC) 2.5 MG tablet  Has the patient contacted their pharmacy? Yes, pharmacy faxed office and no response (Agent: If no, request that the patient contact the pharmacy for the refill. If patient does not wish to contact the pharmacy document the reason why and proceed with request.) (Agent: If yes, when and what did the pharmacy advise?)  Is this the correct pharmacy for this prescription? Yes If no, delete pharmacy and type the correct one.  This is the patient's preferred pharmacy:  [facility] [place], [place] - [address] [place] [address] Phone: [phone] Fax: [phone]   Has the prescription been filled recently? Yes  Is the patient out of the medication? Yes  Has the patient been seen for an appointment in the last year OR does the patient have an upcoming appointment? Yes  Can we respond through MyChart? Yes  Agent: Please be advised that Rx refills may take up to 3 business days. We ask that you follow-up with your pharmacy.

## 2023-01-05 ENCOUNTER — Other Ambulatory Visit: Payer: Self-pay | Admitting: Family Medicine

## 2023-01-13 ENCOUNTER — Telehealth: Payer: Self-pay

## 2023-01-13 NOTE — Telephone Encounter (Signed)
 Copied from CRM [id number]. Topic: Clinical - Medication Refill >> Jan 13, 2023  9:08 AM Franky GRADE wrote: Most Recent Primary Care Visit:  Provider: METHENEY, CATHERINE D  Department: [facility]  Visit Type: SAME DAY  Date: 11/16/2022  Medication: labetalol  (NORMODYNE ) 200 MG tablet  Has the patient contacted their pharmacy? Yes, pharmacy faxed a request to providers office with no response.  (Agent: If no, request that the patient contact the pharmacy for the refill. If patient does not wish to contact the pharmacy document the reason why and proceed with request.) (Agent: If yes, when and what did the pharmacy advise?)  Is this the correct pharmacy for this prescription? Yes If no, delete pharmacy and type the correct one.  This is the patient's preferred pharmacy:  [facility] [place], [place] - [address] [place] [address] Phone: [phone] Fax: [phone]   Has the prescription been filled recently? No  Is the patient out of the medication? No, patient only has enough for two days.   Has the patient been seen for an appointment in the last year OR does the patient have an upcoming appointment? Yes  Can we respond through MyChart? No, patient prefers a phone call.   Agent: Please be advised that Rx refills may take up to 3 business days. We ask that you follow-up with your pharmacy.

## 2023-01-13 NOTE — Telephone Encounter (Signed)
A prescription has been sent to the pharmacy.

## 2023-01-19 ENCOUNTER — Ambulatory Visit: Payer: Medicare Other | Admitting: Family Medicine

## 2023-02-01 DIAGNOSIS — M170 Bilateral primary osteoarthritis of knee: Secondary | ICD-10-CM | POA: Diagnosis not present

## 2023-02-07 ENCOUNTER — Other Ambulatory Visit: Payer: Self-pay | Admitting: Family Medicine

## 2023-02-16 ENCOUNTER — Encounter: Payer: Self-pay | Admitting: Family Medicine

## 2023-02-16 ENCOUNTER — Ambulatory Visit: Payer: Medicare Other | Admitting: Family Medicine

## 2023-02-16 VITALS — BP 135/68 | HR 81 | Ht 64.0 in | Wt 251.0 lb

## 2023-02-16 DIAGNOSIS — M7989 Other specified soft tissue disorders: Secondary | ICD-10-CM

## 2023-02-16 DIAGNOSIS — R7302 Impaired glucose tolerance (oral): Secondary | ICD-10-CM | POA: Diagnosis not present

## 2023-02-16 DIAGNOSIS — R300 Dysuria: Secondary | ICD-10-CM | POA: Diagnosis not present

## 2023-02-16 DIAGNOSIS — N3001 Acute cystitis with hematuria: Secondary | ICD-10-CM

## 2023-02-16 DIAGNOSIS — I10 Essential (primary) hypertension: Secondary | ICD-10-CM | POA: Diagnosis not present

## 2023-02-16 DIAGNOSIS — D692 Other nonthrombocytopenic purpura: Secondary | ICD-10-CM

## 2023-02-16 DIAGNOSIS — I5032 Chronic diastolic (congestive) heart failure: Secondary | ICD-10-CM | POA: Diagnosis not present

## 2023-02-16 DIAGNOSIS — E785 Hyperlipidemia, unspecified: Secondary | ICD-10-CM

## 2023-02-16 LAB — POCT URINALYSIS DIP (CLINITEK)
Glucose, UA: NEGATIVE mg/dL
Nitrite, UA: POSITIVE — AB
POC PROTEIN,UA: >=300 — AB
Spec Grav, UA: 1.025 (ref 1.010–1.025)
Urobilinogen, UA: >=8 U/dL — AB
pH, UA: 7 (ref 5.0–8.0)

## 2023-02-16 LAB — POCT GLYCOSYLATED HEMOGLOBIN (HGB A1C): Hemoglobin A1C: 6.2 % — AB (ref 4.0–5.6)

## 2023-02-16 MED ORDER — NITROFURANTOIN MONOHYD MACRO 100 MG PO CAPS: 100.0000 mg | ORAL_CAPSULE | Freq: Two times a day (BID) | ORAL | 0 refills | Status: DC

## 2023-02-16 NOTE — Assessment & Plan Note (Signed)
Due to recheck lipids. 

## 2023-02-16 NOTE — Patient Instructions (Signed)
 Please increase the furosemide  to 1-1/2 tabs in the morning.  Lets see if over the next week that improves your swelling if it seems to be under better control then you can try going back down to 1 tab in the morning.

## 2023-02-16 NOTE — Progress Notes (Signed)
 Acute Office Visit  Subjective:     Patient ID: Donna Sexton, female    DOB: 1950-11-29, 73 y.o.   MRN: [id number]  Chief Complaint  Patient presents with   Edema    HPI Patient is in today for swelling in her feet and lower legs has been going on for a long time but it would always look there is mornings and worse at the end of the day she may try cutting her fluid back to about 60 ounces and felt like it really was not helping she takes furosemide  daily.  For short period time she did increase it to twice a day but felt like it was drying her out too much.   Dysuria x 1 week.  Is also had some urgency and incontinence with it as well.  No fevers or chills a little bit of back pain with it.  Says drinks about 60 oz fluid a day.    ROS      Objective:    BP 135/68   Pulse 81   Ht 5' 4 (1.626 m)   Wt 251 lb (113.9 kg)   SpO2 96%   BMI 43.08 kg/m    Physical Exam  Results for orders placed or performed in visit on 02/16/23  CBC  Result Value Ref Range   WBC 7.8 3.4 - 10.8 x10E3/uL   RBC 3.77 3.77 - 5.28 x10E6/uL   Hemoglobin 12.9 11.1 - 15.9 g/dL   Hematocrit 63.0 65.9 - 46.6 %   MCV 98 (H) 79 - 97 fL   MCH 34.2 (H) 26.6 - 33.0 pg   MCHC 35.0 31.5 - 35.7 g/dL   RDW 87.7 88.2 - 84.5 %   Platelets 199 150 - 450 x10E3/uL  TSH  Result Value Ref Range   TSH 2.090 0.450 - 4.500 uIU/mL  POCT HgB A1C  Result Value Ref Range   Hemoglobin A1C 6.2 (A) 4.0 - 5.6 %   HbA1c POC (<> result, manual entry)     HbA1c, POC (prediabetic range)     HbA1c, POC (controlled diabetic range)    POCT URINALYSIS DIP (CLINITEK)  Result Value Ref Range   Color, UA red (A) yellow   Clarity, UA cloudy (A) clear   Glucose, UA negative negative mg/dL   Bilirubin, UA large (A) negative   Ketones, POC UA small (15) (A) negative mg/dL   Spec Grav, UA 8.974 8.989 - 1.025   Blood, UA large (A) negative   pH, UA 7.0 5.0 - 8.0   POC PROTEIN,UA >=300 (A) negative, trace   Urobilinogen,  UA >=8.0 (A) 0.2 or 1.0 E.U./dL   Nitrite, UA Positive (A) Negative   Leukocytes, UA Large (3+) (A) Negative        Assessment & Plan:   Problem List Items Addressed This Visit       Cardiovascular and Mediastinum   Essential hypertension - Primary (Chronic)   Blood pressure is just a little borderline today in the 130s.  Will continue to monitor carefully.      Relevant Orders   CBC (Completed)   Comprehensive metabolic panel   Lipid panel   TSH (Completed)   Senile purpura (HCC)   Chronic heart failure with preserved ejection fraction (HCC)   Relevant Orders   CBC (Completed)   Comprehensive metabolic panel   Lipid panel     Endocrine   Impaired glucose tolerance (Chronic)   A1C is up to 6.2 form last time.  Work on cutting back on sweets and carbs.       Relevant Orders   POCT HgB A1C (Completed)   POCT URINALYSIS DIP (CLINITEK) (Completed)   CBC (Completed)   Comprehensive metabolic panel   Lipid panel     Other   Hyperlipidemia (Chronic)   Due to recheck lipids.       Other Visit Diagnoses       Dysuria       Relevant Orders   POCT HgB A1C (Completed)   POCT URINALYSIS DIP (CLINITEK) (Completed)   CBC (Completed)   Comprehensive metabolic panel   Lipid panel   Urine Culture     Acute cystitis with hematuria       Relevant Medications   nitrofurantoin , macrocrystal-monohydrate, (MACROBID ) 100 MG capsule     Localized swelling of both lower extremities       Relevant Orders   CBC (Completed)   Comprehensive metabolic panel   Lipid panel   TSH (Completed)       UTI with gross hematuria-we will treat with nitrofurantoin  will send for culture and will call with results once available.  I do wonder if this could be contributing to her more persistent swelling recently.  Extremity swelling-sounds like it is more persistent than it used to be.  It would always be better in the morning which is more consistent with venous stasis.  Will try  increasing her diuretic to 1-1/2 tabs and see if that is helpful.  She is already limited fluids to 60 ounces a day.  Also consider that she could be getting some lymphedema as well.  She also has a UTI right now which could be aggravating her symptoms.  Will get updated labs just to make sure renal function etc. is normal.  Meds ordered this encounter  Medications   nitrofurantoin , macrocrystal-monohydrate, (MACROBID ) 100 MG capsule    Sig: Take 1 capsule (100 mg total) by mouth 2 (two) times daily.    Dispense:  10 capsule    Refill:  0    Return in about 3 weeks (around 03/09/2023) for recheck swelling and urine sample .  Donna Byars, MD

## 2023-02-16 NOTE — Assessment & Plan Note (Signed)
 Blood pressure is just a little borderline today in the 130s.  Will continue to monitor carefully.

## 2023-02-16 NOTE — Assessment & Plan Note (Addendum)
 A1C is up to 6.2 form last time.  Work on cutting back on sweets and carbs.

## 2023-02-17 ENCOUNTER — Encounter: Payer: Self-pay | Admitting: Family Medicine

## 2023-02-17 LAB — TSH: TSH: 2.09 u[IU]/mL (ref 0.450–4.500)

## 2023-02-17 LAB — CBC
Hematocrit: 36.9 % (ref 34.0–46.6)
Hemoglobin: 12.9 g/dL (ref 11.1–15.9)
MCH: 34.2 pg — ABNORMAL HIGH (ref 26.6–33.0)
MCHC: 35 g/dL (ref 31.5–35.7)
MCV: 98 fL — ABNORMAL HIGH (ref 79–97)
Platelets: 199 10*3/uL (ref 150–450)
RBC: 3.77 x10E6/uL (ref 3.77–5.28)
RDW: 12.2 % (ref 11.7–15.4)
WBC: 7.8 10*3/uL (ref 3.4–10.8)

## 2023-02-17 NOTE — Progress Notes (Signed)
 Hi [name], hemoglobin looks good no sign of anemia or infection.  Thyroid  level looks perfect.

## 2023-02-20 ENCOUNTER — Other Ambulatory Visit: Payer: Self-pay | Admitting: Family Medicine

## 2023-02-20 DIAGNOSIS — N3001 Acute cystitis with hematuria: Secondary | ICD-10-CM

## 2023-02-20 LAB — URINE CULTURE

## 2023-02-21 ENCOUNTER — Other Ambulatory Visit: Payer: Self-pay | Admitting: Family Medicine

## 2023-02-21 DIAGNOSIS — L820 Inflamed seborrheic keratosis: Secondary | ICD-10-CM | POA: Diagnosis not present

## 2023-02-21 DIAGNOSIS — Z85828 Personal history of other malignant neoplasm of skin: Secondary | ICD-10-CM | POA: Diagnosis not present

## 2023-02-21 DIAGNOSIS — N3001 Acute cystitis with hematuria: Secondary | ICD-10-CM

## 2023-02-21 DIAGNOSIS — D225 Melanocytic nevi of trunk: Secondary | ICD-10-CM | POA: Diagnosis not present

## 2023-02-21 NOTE — Progress Notes (Signed)
Hi Donna Sexton, your urine culture came back positive for E. coli.  The antibiotic that I put you on should work great for the specific bacteria so hopefully you are feeling a lot better.  Just make sure to complete the antibiotic course

## 2023-03-01 ENCOUNTER — Other Ambulatory Visit: Payer: Self-pay

## 2023-03-01 ENCOUNTER — Emergency Department (HOSPITAL_COMMUNITY): Payer: Medicare Other

## 2023-03-01 ENCOUNTER — Inpatient Hospital Stay (HOSPITAL_COMMUNITY)
Admission: EM | Admit: 2023-03-01 | Discharge: 2023-03-10 | DRG: 871 | Disposition: A | Discharge status: Skilled Nursing Facility | Payer: Medicare Other | Attending: Internal Medicine | Admitting: Internal Medicine

## 2023-03-01 ENCOUNTER — Encounter (HOSPITAL_COMMUNITY): Payer: Self-pay

## 2023-03-01 DIAGNOSIS — I10 Essential (primary) hypertension: Secondary | ICD-10-CM | POA: Diagnosis not present

## 2023-03-01 DIAGNOSIS — I4819 Other persistent atrial fibrillation: Secondary | ICD-10-CM | POA: Diagnosis present

## 2023-03-01 DIAGNOSIS — R112 Nausea with vomiting, unspecified: Secondary | ICD-10-CM | POA: Diagnosis not present

## 2023-03-01 DIAGNOSIS — R000 Tachycardia, unspecified: Secondary | ICD-10-CM | POA: Diagnosis not present

## 2023-03-01 DIAGNOSIS — D692 Other nonthrombocytopenic purpura: Secondary | ICD-10-CM | POA: Diagnosis not present

## 2023-03-01 DIAGNOSIS — A419 Sepsis, unspecified organism: Secondary | ICD-10-CM

## 2023-03-01 DIAGNOSIS — I110 Hypertensive heart disease with heart failure: Secondary | ICD-10-CM | POA: Diagnosis present

## 2023-03-01 DIAGNOSIS — J9811 Atelectasis: Secondary | ICD-10-CM | POA: Diagnosis not present

## 2023-03-01 DIAGNOSIS — G9341 Metabolic encephalopathy: Secondary | ICD-10-CM | POA: Diagnosis present

## 2023-03-01 DIAGNOSIS — J939 Pneumothorax, unspecified: Secondary | ICD-10-CM | POA: Diagnosis not present

## 2023-03-01 DIAGNOSIS — N170 Acute kidney failure with tubular necrosis: Secondary | ICD-10-CM | POA: Diagnosis present

## 2023-03-01 DIAGNOSIS — R918 Other nonspecific abnormal finding of lung field: Secondary | ICD-10-CM | POA: Diagnosis not present

## 2023-03-01 DIAGNOSIS — Z5941 Food insecurity: Secondary | ICD-10-CM

## 2023-03-01 DIAGNOSIS — R7303 Prediabetes: Secondary | ICD-10-CM | POA: Diagnosis not present

## 2023-03-01 DIAGNOSIS — H0589 Other disorders of orbit: Secondary | ICD-10-CM

## 2023-03-01 DIAGNOSIS — M8580 Other specified disorders of bone density and structure, unspecified site: Secondary | ICD-10-CM | POA: Diagnosis present

## 2023-03-01 DIAGNOSIS — U071 COVID-19: Secondary | ICD-10-CM | POA: Diagnosis not present

## 2023-03-01 DIAGNOSIS — Z28311 Partially vaccinated for covid-19: Secondary | ICD-10-CM | POA: Diagnosis not present

## 2023-03-01 DIAGNOSIS — N390 Urinary tract infection, site not specified: Secondary | ICD-10-CM | POA: Diagnosis present

## 2023-03-01 DIAGNOSIS — M47812 Spondylosis without myelopathy or radiculopathy, cervical region: Secondary | ICD-10-CM | POA: Diagnosis not present

## 2023-03-01 DIAGNOSIS — E872 Acidosis, unspecified: Secondary | ICD-10-CM | POA: Diagnosis not present

## 2023-03-01 DIAGNOSIS — S50822A Blister (nonthermal) of left forearm, initial encounter: Secondary | ICD-10-CM | POA: Diagnosis present

## 2023-03-01 DIAGNOSIS — E785 Hyperlipidemia, unspecified: Secondary | ICD-10-CM | POA: Diagnosis not present

## 2023-03-01 DIAGNOSIS — R609 Edema, unspecified: Secondary | ICD-10-CM | POA: Diagnosis not present

## 2023-03-01 DIAGNOSIS — R6520 Severe sepsis without septic shock: Secondary | ICD-10-CM | POA: Diagnosis not present

## 2023-03-01 DIAGNOSIS — F319 Bipolar disorder, unspecified: Secondary | ICD-10-CM | POA: Diagnosis present

## 2023-03-01 DIAGNOSIS — Z23 Encounter for immunization: Secondary | ICD-10-CM | POA: Diagnosis not present

## 2023-03-01 DIAGNOSIS — M4319 Spondylolisthesis, multiple sites in spine: Secondary | ICD-10-CM | POA: Diagnosis not present

## 2023-03-01 DIAGNOSIS — R220 Localized swelling, mass and lump, head: Secondary | ICD-10-CM | POA: Diagnosis not present

## 2023-03-01 DIAGNOSIS — Z8249 Family history of ischemic heart disease and other diseases of the circulatory system: Secondary | ICD-10-CM

## 2023-03-01 DIAGNOSIS — R200 Anesthesia of skin: Secondary | ICD-10-CM | POA: Diagnosis present

## 2023-03-01 DIAGNOSIS — R5381 Other malaise: Secondary | ICD-10-CM | POA: Diagnosis not present

## 2023-03-01 DIAGNOSIS — A4189 Other specified sepsis: Secondary | ICD-10-CM | POA: Diagnosis not present

## 2023-03-01 DIAGNOSIS — N179 Acute kidney failure, unspecified: Secondary | ICD-10-CM | POA: Diagnosis not present

## 2023-03-01 DIAGNOSIS — G4730 Sleep apnea, unspecified: Secondary | ICD-10-CM | POA: Diagnosis present

## 2023-03-01 DIAGNOSIS — Z6841 Body Mass Index (BMI) 40.0 and over, adult: Secondary | ICD-10-CM | POA: Diagnosis not present

## 2023-03-01 DIAGNOSIS — W19XXXA Unspecified fall, initial encounter: Secondary | ICD-10-CM | POA: Diagnosis present

## 2023-03-01 DIAGNOSIS — J9601 Acute respiratory failure with hypoxia: Secondary | ICD-10-CM | POA: Diagnosis not present

## 2023-03-01 DIAGNOSIS — Z79899 Other long term (current) drug therapy: Secondary | ICD-10-CM | POA: Diagnosis not present

## 2023-03-01 DIAGNOSIS — I4891 Unspecified atrial fibrillation: Secondary | ICD-10-CM | POA: Diagnosis not present

## 2023-03-01 DIAGNOSIS — Z9882 Breast implant status: Secondary | ICD-10-CM

## 2023-03-01 DIAGNOSIS — Z833 Family history of diabetes mellitus: Secondary | ICD-10-CM

## 2023-03-01 DIAGNOSIS — D3161 Benign neoplasm of unspecified site of right orbit: Secondary | ICD-10-CM | POA: Diagnosis not present

## 2023-03-01 DIAGNOSIS — Z299 Encounter for prophylactic measures, unspecified: Secondary | ICD-10-CM | POA: Diagnosis not present

## 2023-03-01 DIAGNOSIS — Z7401 Bed confinement status: Secondary | ICD-10-CM | POA: Diagnosis not present

## 2023-03-01 DIAGNOSIS — R700 Elevated erythrocyte sedimentation rate: Secondary | ICD-10-CM | POA: Diagnosis present

## 2023-03-01 DIAGNOSIS — I5032 Chronic diastolic (congestive) heart failure: Secondary | ICD-10-CM | POA: Diagnosis present

## 2023-03-01 DIAGNOSIS — Z9049 Acquired absence of other specified parts of digestive tract: Secondary | ICD-10-CM

## 2023-03-01 DIAGNOSIS — Z87440 Personal history of urinary (tract) infections: Secondary | ICD-10-CM | POA: Diagnosis not present

## 2023-03-01 DIAGNOSIS — T796XXA Traumatic ischemia of muscle, initial encounter: Secondary | ICD-10-CM | POA: Diagnosis not present

## 2023-03-01 DIAGNOSIS — J320 Chronic maxillary sinusitis: Secondary | ICD-10-CM | POA: Diagnosis present

## 2023-03-01 DIAGNOSIS — Z7901 Long term (current) use of anticoagulants: Secondary | ICD-10-CM

## 2023-03-01 DIAGNOSIS — F411 Generalized anxiety disorder: Secondary | ICD-10-CM

## 2023-03-01 DIAGNOSIS — I472 Ventricular tachycardia, unspecified: Secondary | ICD-10-CM

## 2023-03-01 DIAGNOSIS — R062 Wheezing: Secondary | ICD-10-CM | POA: Diagnosis not present

## 2023-03-01 DIAGNOSIS — E7800 Pure hypercholesterolemia, unspecified: Secondary | ICD-10-CM | POA: Diagnosis not present

## 2023-03-01 DIAGNOSIS — I4901 Ventricular fibrillation: Secondary | ICD-10-CM | POA: Diagnosis not present

## 2023-03-01 DIAGNOSIS — I6782 Cerebral ischemia: Secondary | ICD-10-CM | POA: Diagnosis not present

## 2023-03-01 DIAGNOSIS — R52 Pain, unspecified: Secondary | ICD-10-CM | POA: Diagnosis not present

## 2023-03-01 DIAGNOSIS — R7982 Elevated C-reactive protein (CRP): Secondary | ICD-10-CM | POA: Diagnosis present

## 2023-03-01 DIAGNOSIS — I499 Cardiac arrhythmia, unspecified: Secondary | ICD-10-CM | POA: Diagnosis not present

## 2023-03-01 DIAGNOSIS — Y92009 Unspecified place in unspecified non-institutional (private) residence as the place of occurrence of the external cause: Secondary | ICD-10-CM

## 2023-03-01 DIAGNOSIS — K5909 Other constipation: Secondary | ICD-10-CM | POA: Diagnosis not present

## 2023-03-01 DIAGNOSIS — E66813 Obesity, class 3: Secondary | ICD-10-CM | POA: Diagnosis not present

## 2023-03-01 DIAGNOSIS — R4189 Other symptoms and signs involving cognitive functions and awareness: Secondary | ICD-10-CM | POA: Diagnosis present

## 2023-03-01 DIAGNOSIS — H059 Unspecified disorder of orbit: Secondary | ICD-10-CM | POA: Diagnosis present

## 2023-03-01 DIAGNOSIS — R531 Weakness: Secondary | ICD-10-CM | POA: Diagnosis not present

## 2023-03-01 DIAGNOSIS — Z90710 Acquired absence of both cervix and uterus: Secondary | ICD-10-CM

## 2023-03-01 DIAGNOSIS — Z885 Allergy status to narcotic agent status: Secondary | ICD-10-CM

## 2023-03-01 DIAGNOSIS — Z9181 History of falling: Secondary | ICD-10-CM | POA: Diagnosis not present

## 2023-03-01 HISTORY — DX: COVID-19: U07.1

## 2023-03-01 HISTORY — DX: Acute respiratory failure with hypoxia: J96.01

## 2023-03-01 LAB — COMPREHENSIVE METABOLIC PANEL
ALT: 32 U/L (ref 0–44)
AST: 69 U/L — ABNORMAL HIGH (ref 15–41)
Albumin: 3.2 g/dL — ABNORMAL LOW (ref 3.5–5.0)
Alkaline Phosphatase: 47 U/L (ref 38–126)
Anion gap: 14 (ref 5–15)
BUN: 18 mg/dL (ref 8–23)
CO2: 23 mmol/L (ref 22–32)
Calcium: 9.1 mg/dL (ref 8.9–10.3)
Chloride: 98 mmol/L (ref 98–111)
Creatinine, Ser: 1 mg/dL (ref 0.44–1.00)
GFR, Estimated: 59 mL/min — ABNORMAL LOW (ref >60)
Glucose, Bld: 131 mg/dL — ABNORMAL HIGH (ref 70–99)
Potassium: 3.5 mmol/L (ref 3.5–5.1)
Sodium: 135 mmol/L (ref 135–145)
Total Bilirubin: 2.3 mg/dL — ABNORMAL HIGH (ref 0.0–1.2)
Total Protein: 5.9 g/dL — ABNORMAL LOW (ref 6.5–8.1)

## 2023-03-01 LAB — APTT: aPTT: 58 s — ABNORMAL HIGH (ref 24–36)

## 2023-03-01 LAB — CBC WITH DIFFERENTIAL/PLATELET
Abs Immature Granulocytes: 0.19 10*3/uL — ABNORMAL HIGH (ref 0.00–0.07)
Basophils Absolute: 0.1 10*3/uL (ref 0.0–0.1)
Basophils Relative: 1 %
Eosinophils Absolute: 0 10*3/uL (ref 0.0–0.5)
Eosinophils Relative: 0 %
HCT: 40.8 % (ref 36.0–46.0)
Hemoglobin: 14 g/dL (ref 12.0–15.0)
Immature Granulocytes: 2 %
Lymphocytes Relative: 12 %
Lymphs Abs: 1.2 10*3/uL (ref 0.7–4.0)
MCH: 33.3 pg (ref 26.0–34.0)
MCHC: 34.3 g/dL (ref 30.0–36.0)
MCV: 97.1 fL (ref 80.0–100.0)
Monocytes Absolute: 2.4 10*3/uL — ABNORMAL HIGH (ref 0.1–1.0)
Monocytes Relative: 24 %
Neutro Abs: 6.3 10*3/uL (ref 1.7–7.7)
Neutrophils Relative %: 61 %
Platelets: 146 10*3/uL — ABNORMAL LOW (ref 150–400)
RBC: 4.2 MIL/uL (ref 3.87–5.11)
RDW: 12.7 % (ref 11.5–15.5)
WBC: 10.2 10*3/uL (ref 4.0–10.5)
nRBC: 0 % (ref 0.0–0.2)

## 2023-03-01 LAB — RESP PANEL BY RT-PCR (RSV, FLU A&B, COVID)  RVPGX2
Influenza A by PCR: NEGATIVE
Influenza B by PCR: NEGATIVE
Resp Syncytial Virus by PCR: NEGATIVE
SARS Coronavirus 2 by RT PCR: POSITIVE — AB

## 2023-03-01 LAB — CK: Total CK: 905 U/L — ABNORMAL HIGH (ref 38–234)

## 2023-03-01 LAB — I-STAT CG4 LACTIC ACID, ED
Lactic Acid, Venous: 3 mmol/L (ref 0.5–1.9)
Lactic Acid, Venous: 1.2 mmol/L (ref 0.5–1.9)

## 2023-03-01 LAB — PROTIME-INR
INR: 1.1 (ref 0.8–1.2)
Prothrombin Time: 14.2 s (ref 11.4–15.2)

## 2023-03-01 MED ORDER — VANCOMYCIN HCL 2000 MG/400ML IV SOLN
2000.0000 mg | Freq: Once | INTRAVENOUS | Status: AC
  Administered 2023-03-01: 2000 mg via INTRAVENOUS
  Filled 2023-03-01: qty 400

## 2023-03-01 MED ORDER — METRONIDAZOLE 500 MG/100ML IV SOLN
500.0000 mg | Freq: Once | INTRAVENOUS | Status: AC
  Administered 2023-03-01: 500 mg via INTRAVENOUS
  Filled 2023-03-01: qty 100

## 2023-03-01 MED ORDER — HEPARIN (PORCINE) 25000 UT/250ML-% IV SOLN
900.0000 [IU]/h | INTRAVENOUS | Status: DC
  Administered 2023-03-01: 1300 [IU]/h via INTRAVENOUS
  Administered 2023-03-02: 1100 [IU]/h via INTRAVENOUS
  Filled 2023-03-01 (×2): qty 250

## 2023-03-01 MED ORDER — DEXAMETHASONE SODIUM PHOSPHATE 10 MG/ML IJ SOLN
6.0000 mg | Freq: Once | INTRAMUSCULAR | Status: AC
  Administered 2023-03-01: 6 mg via INTRAVENOUS
  Filled 2023-03-01: qty 1

## 2023-03-01 MED ORDER — ACETAMINOPHEN 650 MG RE SUPP: 650.0000 mg | Freq: Four times a day (QID) | RECTAL | Status: DC | PRN

## 2023-03-01 MED ORDER — SODIUM CHLORIDE 0.9 % IV SOLN
100.0000 mg | Freq: Every day | INTRAVENOUS | Status: DC
  Filled 2023-03-01: qty 20

## 2023-03-01 MED ORDER — BISACODYL 5 MG PO TBEC
5.0000 mg | DELAYED_RELEASE_TABLET | Freq: Every day | ORAL | Status: DC | PRN
  Administered 2023-03-08: 5 mg via ORAL
  Filled 2023-03-01: qty 1

## 2023-03-01 MED ORDER — LACTATED RINGERS IV SOLN: INTRAVENOUS | Status: DC

## 2023-03-01 MED ORDER — AMIODARONE HCL IN DEXTROSE 360-4.14 MG/200ML-% IV SOLN
60.0000 mg/h | INTRAVENOUS | Status: DC
  Administered 2023-03-01 (×2): 60 mg/h via INTRAVENOUS
  Filled 2023-03-01 (×2): qty 200

## 2023-03-01 MED ORDER — ONDANSETRON HCL 4 MG PO TABS: 4.0000 mg | ORAL_TABLET | Freq: Four times a day (QID) | ORAL | Status: DC | PRN

## 2023-03-01 MED ORDER — ACETAMINOPHEN 500 MG PO TABS
500.0000 mg | ORAL_TABLET | Freq: Four times a day (QID) | ORAL | Status: DC | PRN
  Administered 2023-03-05 – 2023-03-09 (×2): 500 mg via ORAL
  Filled 2023-03-01 (×5): qty 1

## 2023-03-01 MED ORDER — DILTIAZEM HCL-DEXTROSE 125-5 MG/125ML-% IV SOLN (PREMIX)
5.0000 mg/h | INTRAVENOUS | Status: DC
  Administered 2023-03-01: 5 mg/h via INTRAVENOUS
  Administered 2023-03-02: 15 mg/h via INTRAVENOUS
  Administered 2023-03-02: 10 mg/h via INTRAVENOUS
  Administered 2023-03-03: 15 mg/h via INTRAVENOUS
  Filled 2023-03-01 (×5): qty 125

## 2023-03-01 MED ORDER — VANCOMYCIN HCL IN DEXTROSE 1-5 GM/200ML-% IV SOLN: 1000.0000 mg | Freq: Once | INTRAVENOUS | Status: DC

## 2023-03-01 MED ORDER — DEXAMETHASONE SODIUM PHOSPHATE 10 MG/ML IJ SOLN
6.0000 mg | INTRAMUSCULAR | Status: DC
  Administered 2023-03-02 – 2023-03-04 (×3): 6 mg via INTRAVENOUS
  Filled 2023-03-01 (×4): qty 0.6

## 2023-03-01 MED ORDER — FAMOTIDINE 20 MG PO TABS
20.0000 mg | ORAL_TABLET | Freq: Every day | ORAL | Status: DC
  Administered 2023-03-02 – 2023-03-10 (×9): 20 mg via ORAL
  Filled 2023-03-01 (×11): qty 1

## 2023-03-01 MED ORDER — HEPARIN BOLUS VIA INFUSION
4500.0000 [IU] | Freq: Once | INTRAVENOUS | Status: AC
  Administered 2023-03-01: 4500 [IU] via INTRAVENOUS
  Filled 2023-03-01: qty 4500

## 2023-03-01 MED ORDER — DIVALPROEX SODIUM ER 500 MG PO TB24
1500.0000 mg | ORAL_TABLET | Freq: Every day | ORAL | Status: DC
  Administered 2023-03-02 – 2023-03-09 (×8): 1500 mg via ORAL
  Filled 2023-03-01 (×9): qty 3

## 2023-03-01 MED ORDER — SODIUM CHLORIDE 0.9 % IV SOLN
200.0000 mg | Freq: Once | INTRAVENOUS | Status: AC
  Administered 2023-03-02: 200 mg via INTRAVENOUS
  Filled 2023-03-01: qty 40

## 2023-03-01 MED ORDER — SODIUM CHLORIDE 0.9 % IV SOLN
2.0000 g | Freq: Once | INTRAVENOUS | Status: AC
  Administered 2023-03-01: 2 g via INTRAVENOUS
  Filled 2023-03-01: qty 12.5

## 2023-03-01 MED ORDER — BARICITINIB 2 MG PO TABS: 2.0000 mg | ORAL_TABLET | Freq: Every day | ORAL | Status: DC

## 2023-03-01 MED ORDER — ACETAMINOPHEN 10 MG/ML IV SOLN
1000.0000 mg | Freq: Four times a day (QID) | INTRAVENOUS | Status: AC
  Administered 2023-03-01 – 2023-03-02 (×4): 1000 mg via INTRAVENOUS
  Filled 2023-03-01 (×4): qty 100

## 2023-03-01 MED ORDER — ONDANSETRON HCL 4 MG/2ML IJ SOLN: 4.0000 mg | Freq: Four times a day (QID) | INTRAMUSCULAR | Status: DC | PRN

## 2023-03-01 MED ORDER — AMIODARONE HCL IN DEXTROSE 360-4.14 MG/200ML-% IV SOLN
30.0000 mg/h | INTRAVENOUS | Status: DC
  Administered 2023-03-02 – 2023-03-03 (×2): 30 mg/h via INTRAVENOUS
  Filled 2023-03-01 (×3): qty 200

## 2023-03-01 MED ORDER — PREGABALIN 25 MG PO CAPS
50.0000 mg | ORAL_CAPSULE | Freq: Three times a day (TID) | ORAL | Status: DC
  Administered 2023-03-02 – 2023-03-10 (×25): 50 mg via ORAL
  Filled 2023-03-01 (×26): qty 2

## 2023-03-01 MED ORDER — AMIODARONE LOAD VIA INFUSION
150.0000 mg | Freq: Once | INTRAVENOUS | Status: AC
  Administered 2023-03-01: 150 mg via INTRAVENOUS
  Filled 2023-03-01: qty 83.34

## 2023-03-01 NOTE — ED Provider Notes (Signed)
[place] EMERGENCY DEPARTMENT AT [facility] Provider Note   CSN: [id number] Arrival date & time: 03/01/23  1433     History  Chief Complaint  Patient presents with   Marletta Lor    Donna Sexton is a 73 y.o. female.  73 year old female with past medical history of hypertension, hyperlipidemia, and CHF presenting to the emergency department today with fever and generalized weakness.  The patient apparently was found down today.  The last time she talked to her family was 2 days ago.  The patient was found down on the ground today.  She was confused.  She was brought in the ER at that time for further evaluation.  The patient is found to be in atrial fibrillation with RVR here.  Nursing staff informed me that the patient has had some runs of ventricular tachycardia here as well but she is currently in atrial fibrillation with RVR.  Her blood pressures have been stable.   Fall       Home Medications Prior to Admission medications   Medication Sig Start Date End Date Taking? Authorizing Provider  amLODipine (NORVASC) 2.5 MG tablet Take 1 tablet (2.5 mg total) by mouth daily. 12/30/22   Agapito Games, MD  atorvastatin (LIPITOR) 40 MG tablet Take 1 tablet (40 mg total) by mouth daily. 05/09/22   Agapito Games, MD  benzoyl peroxide 5 % external liquid Apply topically 2 (two) times daily. Please help her find over-the-counter if not covered by the insurance.  If not available please let us know we can see if the insurance will cover BenzaClin topical. 05/18/21   Agapito Games, MD  clindamycin (CLINDAGEL) 1 % gel Apply topically 2 (two) times daily. X 10 days 04/25/22   Agapito Games, MD  diphenoxylate-atropine (LOMOTIL) 2.5-0.025 MG tablet TAKE ONE TABLET FOUR TIMES DAILY AS NEEDED FOR diarrhea/loose stools 12/23/22   Agapito Games, MD  divalproex (DEPAKOTE ER) 500 MG 24 hr tablet Take 3 tablets (1,500 mg total) by mouth at bedtime. 09/01/22    Cottle, Steva Ready., MD  doxepin (SINEQUAN) 10 MG capsule Take 1 capsule (10 mg total) by mouth at bedtime. 12/19/22   Cottle, Steva Ready., MD  furosemide (LASIX) 40 MG tablet Take 1 tablet (40 mg total) by mouth daily. 12/23/22   Agapito Games, MD  labetalol (NORMODYNE) 200 MG tablet TAKE 1 AND 1/2 TABLETS BY MOUTH IN THE MORNING AND TAKE ONE TABLET IN THE EVENING 01/13/23   Agapito Games, MD  nitrofurantoin, macrocrystal-monohydrate, (MACROBID) 100 MG capsule Take 1 capsule (100 mg total) by mouth 2 (two) times daily. 02/16/23   Agapito Games, MD  oxybutynin (DITROPAN) 5 MG tablet Take 1 tablet (5 mg total) by mouth 2 (two) times daily. 09/29/22   Agapito Games, MD  pregabalin (LYRICA) 50 MG capsule Take 1 capsule (50 mg total) by mouth 3 (three) times daily. 12/30/22   Agapito Games, MD  promethazine (PHENERGAN) 25 MG tablet TAKE ONE TABLET BY MOUTH EVERY 8 HOURS AS NEEDED FOR NAUSEA AND VOMITING 02/08/23   Agapito Games, MD      Allergies    Morphine and Other    Review of Systems   Review of Systems  Reason unable to perform ROS: AMS.    Physical Exam Updated Vital Signs BP (!) 141/103   Pulse 74   Temp (S) (!) 101 F (38.3 C) (Rectal)   Resp (!) 30   SpO2  94%  Physical Exam Vitals and nursing note reviewed.   Gen: Chronically ill-appearing, no acute distress Eyes: PERRL, EOMI HEENT: no oropharyngeal swelling Neck: trachea midline Resp: Diminished at bilateral lung bases Card: Tachycardic, irregular, no murmurs, rubs, or gallops Abd: nontender, nondistended Extremities: no calf tenderness, 1+ pitting edema bilaterally Vascular: 2+ radial pulses bilaterally, 2+ DP pulses bilaterally Neuro: Alert and oriented x 1, the patient does have any obvious cranial nerve deficits and is able to move all extremities on command Skin: no rashes Psyc: acting appropriately   ED Results / Procedures / Treatments   Labs (all labs ordered are  listed, but only abnormal results are displayed) Labs Reviewed  RESP PANEL BY RT-PCR (RSV, FLU A&B, COVID)  RVPGX2 - Abnormal; Notable for the following components:      Result Value   SARS Coronavirus 2 by RT PCR POSITIVE (*)    All other components within normal limits  COMPREHENSIVE METABOLIC PANEL - Abnormal; Notable for the following components:   Glucose, Bld 131 (*)    Total Protein 5.9 (*)    Albumin 3.2 (*)    AST 69 (*)    Total Bilirubin 2.3 (*)    GFR, Estimated 59 (*)    All other components within normal limits  CBC WITH DIFFERENTIAL/PLATELET - Abnormal; Notable for the following components:   Platelets 146 (*)    Monocytes Absolute 2.4 (*)    Abs Immature Granulocytes 0.19 (*)    All other components within normal limits  APTT - Abnormal; Notable for the following components:   aPTT 58 (*)    All other components within normal limits  CK - Abnormal; Notable for the following components:   Total CK 905 (*)    All other components within normal limits  I-STAT CG4 LACTIC ACID, ED - Abnormal; Notable for the following components:   Lactic Acid, Venous 3.0 (*)    All other components within normal limits  CULTURE, BLOOD (ROUTINE X 2)  CULTURE, BLOOD (ROUTINE X 2)  PROTIME-INR  URINALYSIS, W/ REFLEX TO CULTURE (INFECTION SUSPECTED)  I-STAT CG4 LACTIC ACID, ED    EKG None  Radiology DG Chest Port 1 View Result Date: 03/01/2023 CLINICAL DATA:  Question or sepsis. EXAM: PORTABLE CHEST 1 VIEW COMPARISON:  Chest radiograph dated 06/06/2011 FINDINGS: The shallow inspiration with minimal bibasilar atelectasis. No consolidative changes, pleural effusion, pneumothorax. The cardiac silhouette is within normal limits. No acute osseous pathology. IMPRESSION: Minimal bibasilar atelectasis. No focal consolidation. Electronically Signed   By: Elgie Collard M.D.   On: 03/01/2023 15:45    Procedures Procedures    Medications Ordered in ED Medications  amiodarone  (NEXTERONE) 1.8 mg/mL load via infusion 150 mg (150 mg Intravenous Bolus from Bag 03/01/23 1730)    Followed by  amiodarone (NEXTERONE PREMIX) 360-4.14 MG/200ML-% (1.8 mg/mL) IV infusion (60 mg/hr Intravenous New Bag/Given 03/01/23 1729)    Followed by  amiodarone (NEXTERONE PREMIX) 360-4.14 MG/200ML-% (1.8 mg/mL) IV infusion (has no administration in time range)  lactated ringers infusion ( Intravenous New Bag/Given 03/01/23 1651)  metroNIDAZOLE (FLAGYL) IVPB 500 mg (500 mg Intravenous New Bag/Given 03/01/23 1727)  vancomycin (VANCOREADY) IVPB 2000 mg/400 mL (has no administration in time range)  acetaminophen (OFIRMEV) IV 1,000 mg (has no administration in time range)  dexamethasone (DECADRON) injection 6 mg (has no administration in time range)  ceFEPIme (MAXIPIME) 2 g in sodium chloride 0.9 % 100 mL IVPB (0 g Intravenous Stopped 03/01/23 1730)    ED Course/  Medical Decision Making/ A&P                                 Medical Decision Making 73 year old female with past medical history of hypertension, hyperlipidemia, and CHF presenting to the emergency department today with altered mental status with being found down.  The patient does appear to be in atrial fibrillation with RVR here on the monitor.  I have reviewed her rhythm strip and it does appear that he did have what appears to be some will obtain a sepsis workup on the patient and cover her empirically with vancomycin and cefepime.  I will start the patient on amiodarone for the atrial fibrillation/ventricular tachycardia.  Will hold off on fluids given her history of CHF.  I will also obtain a CK on the patient to evaluate for rhabdomyolysis.  She is currently protecting her airway.  She will require admission.  Will obtain a head and cervical spine CT scan to eval for acute traumatic injuries.  The patient's lactic acid is mildly elevated at 3 here.  The remainder of her labs are largely reassuring.  Chest x-ray is clear.  COVID-19 test  is positive which accounts for her fever.  The patient did have improvement in her rate initially with the amiodarone for her atrial fibrillation.  She did have recurrent atrial fibrillation with RVR.  I did call and discussed her case with Dr. [place] from cardiology.  He will come down and evaluate the patient and put in further recommendations.  Her blood pressures remained stable at this time.  The patient was requiring some oxygen was placed on 2 L nasal cannula.  She is given Decadron for the COVID.  A call was placed to hospitalist service for admission.  Amount and/or Complexity of Data Reviewed Labs: ordered. Radiology: ordered.  Risk Prescription drug management. Decision regarding hospitalization.   CRITICAL CARE Performed by: Durwin Glaze   Total critical care time: 55 minutes  Critical care time was exclusive of separately billable procedures and treating other patients.  Critical care was necessary to treat or prevent imminent or life-threatening deterioration.  Critical care was time spent personally by me on the following activities: development of treatment plan with patient and/or surrogate as well as nursing, discussions with consultants, evaluation of patient's response to treatment, examination of patient, obtaining history from patient or surrogate, ordering and performing treatments and interventions, ordering and review of laboratory studies, ordering and review of radiographic studies, pulse oximetry and re-evaluation of patient's condition.         Final Clinical Impression(s) / ED Diagnoses Final diagnoses:  COVID-19  Atrial fibrillation with RVR (HCC)  Ventricular tachycardia [facility])    Rx / DC Orders ED Discharge Orders     None         Durwin Glaze, MD 03/01/23 1806

## 2023-03-01 NOTE — Consult Note (Signed)
Cardiology Consultation   Patient ID: Donna Sexton MRN: [id number]; DOB: 09/20/1950  Admit date: 03/01/2023 Date of Consult: 03/01/2023  PCP:  Agapito Games, MD   [facility] Providers Cardiologist:  [facility]      Patient Profile:   Donna Sexton is a 73 y.o. 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 Rhae Hammock.  History of Present Illness:   Donna Sexton was last seen by our cardiology in 2011 and noted to have ventricular noncompaction as an incidental finding on cardiac MRI as part of a research trial.  She has a remote stress echocardiogram and Holter monitor.  Stress echo was negative for ischemia by EKG or ultrasound findings.  Of note noncompaction was not appreciated on her echo study.  She currently follows with cardiology with [facility].  Echocardiogram in 2021 showed preserved LVEF 55-60%, normal RV and no significant valvular disease.  Nuclear stress test 02/2020 showed no inducible ischemia or scar.  Heart monitor 11/2020 showed no significant arrhythmias and triggered events were associated with normal sinus rhythm, isolated rare PACs.  She was most recently seen in September 2024 and was maintained on 2.5 mg amlodipine, 40 mg Lipitor, 200 mg labetalol twice daily.  She was diagnosed with a UTI with urine cultures positive for E. coli bacteria on 02/16/23, started on macrobid outpatient by PCP. During that visit, she also noted some lower leg swelling likely related to venous insufficiency - diuretic was increased. Lymphedema was also considered.   She was brought to [facility] ED via [facility] 03/01/23 after a ?fall at home.  She spoke with family 2 days ago and found down by a neighbor today.  Unknown downtime.  She was confused and noted to be in A-fib with RVR.  CK 905 Lactic acid  3.0 She is positive for COVID-19 CXR with minimal bibasilar atelectasis but no focal consolidation Blood cultures pending  Code sepsis protocol was initiated and cardiology consulted for A-fib RVR with rates in the 160s.  EKG with atrial fibrillation ventricular rate 165    Past Medical History:  Diagnosis Date   ANEMIA 11/12/2007   Qualifier: Diagnosis of  By: Briscoe Burns CMA, Alvy Beal     Anxiety    Arthritis    ASTHMA NOS W/ACUTE EXACERBATION 01/13/2010   Qualifier: Diagnosis of  By: Yetta Barre MD, Bernadene Bell.    BIPOLAR DISORDER UNSPECIFIED 10/29/2007   Qualifier: Diagnosis of  By: Yetta Barre MD, Bernadene Bell.    COMMON MIGRAINE 12/29/2008   Qualifier: Diagnosis of  By: Yetta Barre MD, Bernadene Bell.    GERD 07/28/2009   Qualifier: Diagnosis of  By: Felicity Coyer MD, Raenette Rover    HYPERLIPIDEMIA 10/29/2007   Qualifier: Diagnosis of  By: Yetta Barre MD, Bernadene Bell.    HYPERTENSION, BENIGN ESSENTIAL 10/29/2007   Qualifier: Diagnosis of  By: Yetta Barre MD, Bernadene Bell.    OSTEOPENIA 10/30/2007   Qualifier: Diagnosis of  By: Yetta Barre MD, Bernadene Bell.    OVERACTIVE BLADDER 02/21/2008   Qualifier: Diagnosis of  By: Jonny Ruiz MD, Len Blalock    SLEEP APNEA 12/31/2009   Qualifier: Diagnosis of  By: Yetta Barre MD, Bernadene Bell.     Past Surgical History:  Procedure Laterality Date   ABDOMINAL HYSTERECTOMY     BREAST IMPLANT REMOVAL Bilateral 2022   BREAST SURGERY  01/10/1978   implants bilateral  CARPAL TUNNEL RELEASE     CHOLECYSTECTOMY     HERNIA REPAIR     PAROTID ENDOSCOPY     TUBAL LIGATION       Home Medications:  Prior to Admission medications   Medication Sig Start Date End Date Taking? Authorizing Provider  amLODipine (NORVASC) 2.5 MG tablet Take 1 tablet (2.5 mg total) by mouth daily. 12/30/22   Agapito Games, MD  atorvastatin (LIPITOR) 40 MG tablet Take 1 tablet (40 mg total) by mouth daily. 05/09/22   Agapito Games, MD  benzoyl peroxide 5 % external liquid Apply topically 2 (two) times daily. Please help her find  over-the-counter if not covered by the insurance.  If not available please let us know we can see if the insurance will cover BenzaClin topical. 05/18/21   Agapito Games, MD  clindamycin (CLINDAGEL) 1 % gel Apply topically 2 (two) times daily. X 10 days 04/25/22   Agapito Games, MD  diphenoxylate-atropine (LOMOTIL) 2.5-0.025 MG tablet TAKE ONE TABLET FOUR TIMES DAILY AS NEEDED FOR diarrhea/loose stools 12/23/22   Agapito Games, MD  divalproex (DEPAKOTE ER) 500 MG 24 hr tablet Take 3 tablets (1,500 mg total) by mouth at bedtime. 09/01/22   Cottle, Steva Ready., MD  doxepin (SINEQUAN) 10 MG capsule Take 1 capsule (10 mg total) by mouth at bedtime. 12/19/22   Cottle, Steva Ready., MD  furosemide (LASIX) 40 MG tablet Take 1 tablet (40 mg total) by mouth daily. 12/23/22   Agapito Games, MD  labetalol (NORMODYNE) 200 MG tablet TAKE 1 AND 1/2 TABLETS BY MOUTH IN THE MORNING AND TAKE ONE TABLET IN THE EVENING 01/13/23   Agapito Games, MD  nitrofurantoin, macrocrystal-monohydrate, (MACROBID) 100 MG capsule Take 1 capsule (100 mg total) by mouth 2 (two) times daily. 02/16/23   Agapito Games, MD  oxybutynin (DITROPAN) 5 MG tablet Take 1 tablet (5 mg total) by mouth 2 (two) times daily. 09/29/22   Agapito Games, MD  pregabalin (LYRICA) 50 MG capsule Take 1 capsule (50 mg total) by mouth 3 (three) times daily. 12/30/22   Agapito Games, MD  promethazine (PHENERGAN) 25 MG tablet TAKE ONE TABLET BY MOUTH EVERY 8 HOURS AS NEEDED FOR NAUSEA AND VOMITING 02/08/23   Agapito Games, MD    Inpatient Medications: Scheduled Meds:  dexamethasone (DECADRON) injection  6 mg Intravenous Once   Continuous Infusions:  acetaminophen     amiodarone 60 mg/hr (03/01/23 1729)   Followed by   amiodarone     lactated ringers 150 mL/hr at 03/01/23 1651   metronidazole 500 mg (03/01/23 1727)   vancomycin     PRN Meds:   Allergies:    Allergies  Allergen Reactions    Morphine Nausea And Vomiting    Other reaction(s): Other (See Comments) Unspecified reaction    Other Other (See Comments)    Allergic to muscle relaxers -unknown reaction Flexeril only can take cyclobenzaprine.    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 She enjoys making wreaths to hang on the doors   Social Drivers of Health   Financial Resource Strain: Low Risk  (02/13/2023)   Overall Financial Resource Strain (CARDIA)    Difficulty of Paying Living Expenses: Not very hard  Food Insecurity: Food Insecurity Present (02/13/2023)   Hunger Vital Sign    Worried About Running Out of Food in the Last Year: Sometimes true    Ran Out of Food in the Last Year: Patient declined  Transportation Needs: Patient Declined (06/03/2022)   PRAPARE - Administrator, Civil Service (Medical): Patient declined    Lack of Transportation (Non-Medical): Patient declined  Physical Activity: Insufficiently Active (02/13/2023)   Exercise Vital Sign    Days of Exercise per Week: 2 days    Minutes of Exercise per Session: 10 min  Stress: Stress Concern Present (02/13/2023)   [employer] of Occupational Health - Occupational Stress Questionnaire    Feeling of Stress : To some extent  Social Connections: Unknown (02/13/2023)   Social Connection and Isolation Panel [NHANES]    Frequency of Communication with Friends and Family: Three  times a week    Frequency of Social Gatherings with Friends and Family: Twice a week    Attends Religious Services: Patient declined    Database administrator or Organizations: No    Attends Banker Meetings: Never    Marital Status: Divorced  Catering manager Violence: Not At Risk (03/28/2022)   Humiliation, Afraid, Rape, and Kick questionnaire    Fear of Current or Ex-Partner: No    Emotionally Abused: No    Physically Abused: No    Sexually Abused: No    Family History:    Family History  Problem Relation Age of Onset   Heart attack Mother    Hypertension Mother    Heart failure Mother    Heart attack Father    Hypertension Father    Diabetes Father    Heart failure Brother    Alcohol abuse Maternal Uncle    Breast cancer Neg Hx      ROS:  Please see the history of present illness.   All other ROS reviewed and negative.     Physical Exam/Data:   Vitals:   03/01/23 1600 03/01/23 1700 03/01/23 1715 03/01/23 1730  BP: (!) 143/99 (!) 142/119 (!) 154/110 (!) 141/103  Pulse: (!) 27 84 (!) 145 74  Resp: (!) 23 19 (!) 29 (!) 30  Temp:      TempSrc:      SpO2: 90% 92% 94% 94%    Intake/Output Summary (Last 24 hours) at 03/01/2023 1807 Last data filed at 03/01/2023 1730 Gross per 24 hour  Intake 101.18 ml  Output --  Net 101.18 ml      02/16/2023   10:04 AM 11/16/2022    8:39 AM 09/19/2022   10:50 AM  Last 3 Weights  Weight (lbs) 251 lb 250 lb 247 lb  Weight (kg) 113.853 kg 113.399 kg 112.038 kg     There is no height or weight on file to calculate BMI.  General:  obese WF appears breathless with no purposeful response.  HEENT: eyes are red and puffy Neck: no JVD Vascular: No carotid bruits; Distal pulses 2+ bilaterally Cardiac:  normal S1, S2; IRRR; tachy, no murmur  Lungs:  clear to auscultation bilaterally, no wheezing, rhonchi or rales  Abd: soft, nontender, no hepatomegaly  Ext: no edema, legs are obese. No pitting Musculoskeletal:  No  deformities, BUE and BLE strength normal and equal Skin: warm and dry  Neuro: awake but not verbally responsive.  EKG:  The EKG was personally reviewed and demonstrates: Atrial fibrillation VR 165 Telemetry:  Telemetry was personally reviewed and demonstrates: Atrial fibrillation with ventricular rates in the 160s  Relevant CV Studies:  Echo 2021: SUMMARY  The left ventricular size is normal.  There is normal left ventricular wall thickness.  Left ventricular systolic function is normal.  LV ejection fraction = 55-60%.  No segmental wall motion abnormalities seen in the left ventricle  The right ventricle is normal size.  The right ventricular systolic function is normal.  The left atrium is moderately dilated.  No significant valvular stenosis or regurgitation.  IVC size was normal.  There is no pericardial effusion.  Probably no significant change from prior study.    Nuclear stress test 02/2020: 1.  No inducible transmural ischemia or transmural scar.  2.  No wall motion abnormalities or transient ischemic dilation.  3.  Left ventricular ejection fraction measures approximately 58% during stress.   Laboratory Data:  High Sensitivity Troponin:  No results for input(s): "TROPONINIHS" in the last 720 hours.   Chemistry Recent Labs  Lab 03/01/23 1447  NA 135  K 3.5  CL 98  CO2 23  GLUCOSE 131*  BUN 18  CREATININE 1.00  CALCIUM 9.1  GFRNONAA 59*  ANIONGAP 14    Recent Labs  Lab 03/01/23 1447  PROT 5.9*  ALBUMIN 3.2*  AST 69*  ALT 32  ALKPHOS 47  BILITOT 2.3*   Lipids No results for input(s): "CHOL", "TRIG", "HDL", "LABVLDL", "LDLCALC", "CHOLHDL" in the last 168 hours.  Hematology Recent Labs  Lab 03/01/23 1447  WBC 10.2  RBC 4.20  HGB 14.0  HCT 40.8  MCV 97.1  MCH 33.3  MCHC 34.3  RDW 12.7  PLT 146*   Thyroid No results for input(s): "TSH", "FREET4" in the last 168 hours.  BNPNo results for input(s): "BNP", "PROBNP" in the last 168 hours.   DDimer No results for input(s): "DDIMER" in the last 168 hours.   Radiology/Studies:  [facility] Chest Port 1 View Result Date: 03/01/2023 CLINICAL DATA:  Question or sepsis. EXAM: PORTABLE CHEST 1 VIEW COMPARISON:  Chest radiograph dated 06/06/2011 FINDINGS: The shallow inspiration with minimal bibasilar atelectasis. No consolidative changes, pleural effusion, pneumothorax. The cardiac silhouette is within normal limits. No acute osseous pathology. IMPRESSION: Minimal bibasilar atelectasis. No focal consolidation. Electronically Signed   By: Elgie Collard M.D.   On: 03/01/2023 15:45     Assessment and Plan:   Atrial fibrillation with RVR -Appears to be a new diagnosis this admission in the setting of sepsis secondary to COVID infection +/- bacteremia -has been started IV amiodarone - agree for now, depending on pulmonary status -TSH 02/2023 was within normal limits - will also start IV Cardizem for rate control. Patient has plenty of BP  - would wait to repeat an echo until she more rate control - will anticoagulate with IV heparin for now. Consider switching to oral DOAC once able to take PO  Sepsis secondary to COVID infection - sepsis protocol initiated - elevated lactic acid 3.0 and CK 905 - per PCP   Recent UTI - treated by PCP - bactrim 02/16/23 - repeat cultures pending  Chronic diastolic  heart failure - maintained on 40 mg lasix at home - holding this for now - suspect she is volume depleted with prolonged down time.  Hypertension - 200 mg labetalol and 2.5 mg amlodipine on hold for rate controlling medications and in the setting of sepsis    Risk Assessment/Risk Scores:   CHA2DS2-VASc Score = 5   This indicates a 7.2% annual risk of stroke. The patient's score is based upon: CHF History: 1 HTN History: 1 Diabetes History: 1 Stroke History: 0 Vascular Disease History: 0 Age Score: 1 Gender Score: 1        For questions or updates, please contact [facility] Please consult [email or web address] for contact info under    Signed, Maison Kestenbaum [place] MD, [facility]  03/01/2023 6:07 PM

## 2023-03-01 NOTE — ED Notes (Signed)
Pt O2 changed from 2L to 4L [place] per MD order.

## 2023-03-01 NOTE — ED Triage Notes (Signed)
[facility] reports pt coming from home for fall. Last seen possibly two days ago. Found by neighbor. Alert per EMS. Aox4 per neighbor, frequent falls. Pt was found prone next to her bed.

## 2023-03-01 NOTE — Progress Notes (Signed)
ANTICOAGULATION CONSULT NOTE  Pharmacy Consult for Heparin Indication: atrial fibrillation  Allergies  Allergen Reactions   Morphine Nausea And Vomiting    Other reaction(s): Other (See Comments) Unspecified reaction    Other Other (See Comments)    Allergic to muscle relaxers -unknown reaction Flexeril only can take cyclobenzaprine.    Patient Measurements:   Heparin Dosing Weight: 82 kg  Vital Signs: Temp: 100 F (37.8 C) (02/19 1914) Temp Source: Oral (02/19 1914) BP: 119/91 (02/19 1845) Pulse Rate: 88 (02/19 1845)  Labs: Recent Labs    03/01/23 1447  HGB 14.0  HCT 40.8  PLT 146*  APTT 58*  LABPROT 14.2  INR 1.1  CREATININE 1.00  CKTOTAL 905*    Estimated Creatinine Clearance: 62 mL/min (by C-G formula based on SCr of 1 mg/dL).   Medical History: Past Medical History:  Diagnosis Date   ANEMIA 11/12/2007   Qualifier: Diagnosis of  By: Briscoe Burns CMA, Alvy Beal     Anxiety    Arthritis    ASTHMA NOS W/ACUTE EXACERBATION 01/13/2010   Qualifier: Diagnosis of  By: Yetta Barre MD, Bernadene Bell.    BIPOLAR DISORDER UNSPECIFIED 10/29/2007   Qualifier: Diagnosis of  By: Yetta Barre MD, Bernadene Bell.    COMMON MIGRAINE 12/29/2008   Qualifier: Diagnosis of  By: Yetta Barre MD, Bernadene Bell.    GERD 07/28/2009   Qualifier: Diagnosis of  By: Felicity Coyer MD, Raenette Rover    HYPERLIPIDEMIA 10/29/2007   Qualifier: Diagnosis of  By: Yetta Barre MD, Bernadene Bell.    HYPERTENSION, BENIGN ESSENTIAL 10/29/2007   Qualifier: Diagnosis of  By: Yetta Barre MD, Bernadene Bell.    OSTEOPENIA 10/30/2007   Qualifier: Diagnosis of  By: Yetta Barre MD, Bernadene Bell.    OVERACTIVE BLADDER 02/21/2008   Qualifier: Diagnosis of  By: Jonny Ruiz MD, Len Blalock    SLEEP APNEA 12/31/2009   Qualifier: Diagnosis of  By: Yetta Barre MD, Bernadene Bell.     Medications:  (Not in a hospital admission)  Scheduled:   dexamethasone (DECADRON) injection  6 mg Intravenous Once   Infusions:   acetaminophen Stopped (03/01/23 1950)   amiodarone 60 mg/hr (03/01/23 1729)   Followed by    amiodarone     diltiazem (CARDIZEM) infusion     lactated ringers 150 mL/hr at 03/01/23 1651   vancomycin 2,000 mg (03/01/23 1819)   PRN:   Assessment: 72 yof with a history of HF, HTN, HLD, DM. Patient is presenting with fall. Found to be in AF w/ RVR in the ED. Heparin per pharmacy consult placed for atrial fibrillation.  Patient is not on anticoagulation prior to arrival.  Hgb 14; plt 146 PT/INR 14.2/1.1  Goal of Therapy:  Heparin level 0.3-0.7 units/ml Monitor platelets by anticoagulation protocol: Yes   Plan:  Give IV heparin 4500 units bolus x 1 Start heparin infusion at 1300 units/hr Check anti-Xa level in 8 hours and daily while on heparin Continue to monitor H&H and platelets  Delmar Landau, PharmD, BCPS 03/01/2023 7:52 PM ED Clinical Pharmacist -  [phone]

## 2023-03-01 NOTE — H&P (Addendum)
History and Physical    Patient: Donna Sexton ZOX:[id number] DOB: 03-23-1950 DOA: 03/01/2023 DOS: the patient was seen and examined on 03/01/2023 PCP: Agapito Games, MD  Patient coming from: Home  Chief Complaint:  Chief Complaint  Patient presents with   Fall   HPI: SHAWNTEL FARNWORTH is a 73 y.o. female with medical history significant for normal ejection fraction, hypertension, dyslipidemia, sleep apnea, osteopenia and anemia who was found facedown on her bedroom floor by her son's fianc.  The patient had last seen someone in the family approximately 24 hours earlier.  The patient lives alone. She drives and is completely independent.  Her son lives 2 townhouse is down from hers. When the patient was found on the ground she was able to say "I'm very sick". She was not able to recall or articulate when she fell or what was happening around that time or how long she lay on the ground.   She was taken to the emergency department and during her workup she was found to have COVID and had nonsustained vtach.  Later she went into Afib w RVR.  She was placed on oxygen and IV amiodarone in the emergency department and seen by cardiology.  A Heparin drip was started the patient's mental status started to improve in the emergency department. She will be admitted to a Step down bed on amiodarone and heparin drip.   Review of Systems: unable to review all systems due to the inability of the patient to answer questions. Past Medical History:  Diagnosis Date   ANEMIA 11/12/2007   Qualifier: Diagnosis of  By: Briscoe Burns CMA, Alvy Beal     Anxiety    Arthritis    ASTHMA NOS W/ACUTE EXACERBATION 01/13/2010   Qualifier: Diagnosis of  By: Yetta Barre MD, Bernadene Bell.    BIPOLAR DISORDER UNSPECIFIED 10/29/2007   Qualifier: Diagnosis of  By: Yetta Barre MD, Bernadene Bell.    COMMON MIGRAINE 12/29/2008   Qualifier: Diagnosis of  By: Yetta Barre MD, Bernadene Bell.    GERD 07/28/2009   Qualifier: Diagnosis of  By: Felicity Coyer MD, Raenette Rover     HYPERLIPIDEMIA 10/29/2007   Qualifier: Diagnosis of  By: Yetta Barre MD, Bernadene Bell.    HYPERTENSION, BENIGN ESSENTIAL 10/29/2007   Qualifier: Diagnosis of  By: Yetta Barre MD, Bernadene Bell.    OSTEOPENIA 10/30/2007   Qualifier: Diagnosis of  By: Yetta Barre MD, Bernadene Bell.    OVERACTIVE BLADDER 02/21/2008   Qualifier: Diagnosis of  By: Jonny Ruiz MD, Len Blalock    SLEEP APNEA 12/31/2009   Qualifier: Diagnosis of  By: Yetta Barre MD, Bernadene Bell.    Past Surgical History:  Procedure Laterality Date   ABDOMINAL HYSTERECTOMY     BREAST IMPLANT REMOVAL Bilateral 2022   BREAST SURGERY  01/10/1978   implants bilateral   CARPAL TUNNEL RELEASE     CHOLECYSTECTOMY     HERNIA REPAIR     PAROTID ENDOSCOPY     TUBAL LIGATION     Social History:  reports that she has never smoked. She has never used smokeless tobacco. She reports that she does not drink alcohol and does not use drugs.  Allergies  Allergen Reactions   Morphine Nausea And Vomiting    Other reaction(s): Other (See Comments) Unspecified reaction    Other Other (See Comments)    Allergic to muscle relaxers -unknown reaction Flexeril only can take cyclobenzaprine.    Family History  Problem Relation Age of Onset   Heart attack Mother    Hypertension  Mother    Heart failure Mother    Heart attack Father    Hypertension Father    Diabetes Father    Heart failure Brother    Alcohol abuse Maternal Uncle    Breast cancer Neg Hx     Prior to Admission medications   Medication Sig Start Date End Date Taking? Authorizing Provider  amLODipine (NORVASC) 2.5 MG tablet Take 1 tablet (2.5 mg total) by mouth daily. 12/30/22  Yes Agapito Games, MD  cimetidine (TAGAMET) 200 MG tablet Take 200 mg by mouth daily.   Yes [provider]  diphenoxylate-atropine (LOMOTIL) 2.5-0.025 MG tablet TAKE ONE TABLET FOUR TIMES DAILY AS NEEDED FOR diarrhea/loose stools 12/23/22  Yes Agapito Games, MD  doxepin (SINEQUAN) 10 MG capsule Take 1 capsule (10 mg total)  by mouth at bedtime. 12/19/22  Yes Cottle, Steva Ready., MD  famotidine (PEPCID) 20 MG tablet Take 20 mg by mouth daily.   Yes [provider]  furosemide (LASIX) 40 MG tablet Take 1 tablet (40 mg total) by mouth daily. Patient taking differently: Take 40 mg by mouth 2 (two) times daily. 12/23/22  Yes Agapito Games, MD  oxybutynin (DITROPAN) 5 MG tablet Take 1 tablet (5 mg total) by mouth 2 (two) times daily. 09/29/22  Yes Agapito Games, MD  promethazine (PHENERGAN) 25 MG tablet TAKE ONE TABLET BY MOUTH EVERY 8 HOURS AS NEEDED FOR NAUSEA AND VOMITING 02/08/23  Yes Agapito Games, MD  solifenacin (VESICARE) 10 MG tablet Take 10 mg by mouth daily.   Yes [provider]  atorvastatin (LIPITOR) 40 MG tablet Take 1 tablet (40 mg total) by mouth daily. 05/09/22   Agapito Games, MD  benzoyl peroxide 5 % external liquid Apply topically 2 (two) times daily. Please help her find over-the-counter if not covered by the insurance.  If not available please let us know we can see if the insurance will cover BenzaClin topical. 05/18/21   Agapito Games, MD  clindamycin (CLINDAGEL) 1 % gel Apply topically 2 (two) times daily. X 10 days 04/25/22   Agapito Games, MD  divalproex (DEPAKOTE ER) 500 MG 24 hr tablet Take 3 tablets (1,500 mg total) by mouth at bedtime. 09/01/22   Cottle, Steva Ready., MD  labetalol (NORMODYNE) 200 MG tablet TAKE 1 AND 1/2 TABLETS BY MOUTH IN THE MORNING AND TAKE ONE TABLET IN THE EVENING Patient taking differently: Take 200-300 mg by mouth See admin instructions. Take 1.5 tablets (300mg ) by mouth every morning and then take 1 tablet (200mg ) by mouth every evening 01/13/23   Agapito Games, MD  nitrofurantoin, macrocrystal-monohydrate, (MACROBID) 100 MG capsule Take 1 capsule (100 mg total) by mouth 2 (two) times daily. 02/16/23   Agapito Games, MD  pregabalin (LYRICA) 50 MG capsule Take 1 capsule (50 mg total) by mouth 3 (three)  times daily. 12/30/22   Agapito Games, MD    Physical Exam: Vitals:   03/01/23 1815 03/01/23 1845 03/01/23 1914 03/01/23 1952  BP: (!) 155/106 (!) 119/91    Pulse: (!) 141 88    Resp: (!) 27 (!) 29    Temp:   100 F (37.8 C)   TempSrc:   Oral   SpO2: 100% 99%    Weight:    113.9 kg  Height:    5\' 4"  (1.626 m)   Physical Exam:  General: ill appearing HEENT: face is swollen. PERRL Cardiovascular: Tachy, irregular rhythm. Distal pulses intact.  Pulmonary: normal breath sounds Gastrointestinal: Distended abdomen, soft, non-tender, hypoactive bowel sounds Musculoskeletal: trace to 1+ lower ext edema Skin: Skin is hot and dry. Neuro: No focal deficits noted, slow to respond but awake PSYCH: slightly lethargic and cooperative  Data Reviewed:  Results for orders placed or performed during the hospital encounter of 03/01/23 (from the past 24 hours)  Comprehensive metabolic panel     Status: Abnormal   Collection Time: 03/01/23  2:47 PM  Result Value Ref Range   Sodium 135 135 - 145 mmol/L   Potassium 3.5 3.5 - 5.1 mmol/L   Chloride 98 98 - 111 mmol/L   CO2 23 22 - 32 mmol/L   Glucose, Bld 131 (H) 70 - 99 mg/dL   BUN 18 8 - 23 mg/dL   Creatinine, Ser 1.61 0.44 - 1.00 mg/dL   Calcium 9.1 8.9 - 09.6 mg/dL   Total Protein 5.9 (L) 6.5 - 8.1 g/dL   Albumin 3.2 (L) 3.5 - 5.0 g/dL   AST 69 (H) 15 - 41 U/L   ALT 32 0 - 44 U/L   Alkaline Phosphatase 47 38 - 126 U/L   Total Bilirubin 2.3 (H) 0.0 - 1.2 mg/dL   GFR, Estimated 59 (L) >60 mL/min   Anion gap 14 5 - 15  CBC with Differential     Status: Abnormal   Collection Time: 03/01/23  2:47 PM  Result Value Ref Range   WBC 10.2 4.0 - 10.5 K/uL   RBC 4.20 3.87 - 5.11 MIL/uL   Hemoglobin 14.0 12.0 - 15.0 g/dL   HCT 04.5 40.9 - 81.1 %   MCV 97.1 80.0 - 100.0 fL   MCH 33.3 26.0 - 34.0 pg   MCHC 34.3 30.0 - 36.0 g/dL   RDW 91.4 78.2 - 95.6 %   Platelets 146 (L) 150 - 400 K/uL   nRBC 0.0 0.0 - 0.2 %   Neutrophils Relative %  61 %   Neutro Abs 6.3 1.7 - 7.7 K/uL   Lymphocytes Relative 12 %   Lymphs Abs 1.2 0.7 - 4.0 K/uL   Monocytes Relative 24 %   Monocytes Absolute 2.4 (H) 0.1 - 1.0 K/uL   Eosinophils Relative 0 %   Eosinophils Absolute 0.0 0.0 - 0.5 K/uL   Basophils Relative 1 %   Basophils Absolute 0.1 0.0 - 0.1 K/uL   Immature Granulocytes 2 %   Abs Immature Granulocytes 0.19 (H) 0.00 - 0.07 K/uL  Protime-INR     Status: None   Collection Time: 03/01/23  2:47 PM  Result Value Ref Range   Prothrombin Time 14.2 11.4 - 15.2 seconds   INR 1.1 0.8 - 1.2  APTT     Status: Abnormal   Collection Time: 03/01/23  2:47 PM  Result Value Ref Range   aPTT 58 (H) 24 - 36 seconds  CK     Status: Abnormal   Collection Time: 03/01/23  2:47 PM  Result Value Ref Range   Total CK 905 (H) 38 - 234 U/L  I-Stat Lactic Acid, ED     Status: Abnormal   Collection Time: 03/01/23  3:37 PM  Result Value Ref Range   Lactic Acid, Venous 3.0 (HH) 0.5 - 1.9 mmol/L   Comment NOTIFIED PHYSICIAN   Resp panel by RT-PCR (RSV, Flu A&B, Covid) Anterior Nasal Swab     Status: Abnormal   Collection Time: 03/01/23  4:48 PM   Specimen: Anterior Nasal Swab  Result Value Ref Range   SARS  Coronavirus 2 by RT PCR POSITIVE (A) NEGATIVE   Influenza A by PCR NEGATIVE NEGATIVE   Influenza B by PCR NEGATIVE NEGATIVE   Resp Syncytial Virus by PCR NEGATIVE NEGATIVE  I-Stat Lactic Acid, ED     Status: None   Collection Time: 03/01/23  7:00 PM  Result Value Ref Range   Lactic Acid, Venous 1.2 0.5 - 1.9 mmol/L     Assessment and Plan: Acute hypoxic respiratory failure due to COVID -she received Decadron because she did require 2 L O2 nasal cannula.  Will add remdesivir.  New onset Afib w RVR -appreciate cardiology management.  Continue amiodarone drip and Heparin drip.  Hold oral antihypertensives as recommended by cardiology.  3. Abnormal head CT w Orbital mass -  Non urgent follow up MRI recommended.   Left maxillary sinusitis on CT.  Recent UTI - Check UA.  Consider antibiotics  Decreased mental status - improving   Mild Rhabdo -the patient did receive IV fluids in the emergency department.  Will continue to monitor her CK.  Found down - the Cspine CT had motion artifact and it cannot fully rule out fracture.  A repeat CT can be doen once her mentla status is back at baseline.  She is not reporting any neck pain at this time.   Advance Care Planning:   Code Status: Full Code the patient's healthcare power of attorney is her ex-husband Trevia Nop [phone].  CODE STATUS discussion was deferred as the patient's healthcare power of attorney is not present.  She will be full code by default.   Consults: [facility] cardiology  Family Communication: Her son [name] was at the bedside [phone]) He provides a history and tells me that the patient's ex-husband Lyla Son is the healthcare power of attorney, but he can often reach Keri if he does not answer the phone.  Severity of Illness: The appropriate patient status for this patient is INPATIENT. Inpatient status is judged to be reasonable and necessary in order to provide the required intensity of service to ensure the patient's safety. The patient's presenting symptoms, physical exam findings, and initial radiographic and laboratory data in the context of their chronic comorbidities is felt to place them at high risk for further clinical deterioration. Furthermore, it is not anticipated that the patient will be medically stable for discharge from the hospital within 2 midnights of admission.   * I certify that at the point of admission it is my clinical judgment that the patient will require inpatient hospital care spanning beyond 2 midnights from the point of admission due to high intensity of service, high risk for further deterioration and high frequency of surveillance required.*  Author: Buena Irish, MD 03/01/2023 9:52 PM  For on call review [email or web address].

## 2023-03-01 NOTE — Sepsis Progress Note (Signed)
 Code Sepsis protocol being monitored by eLink.

## 2023-03-01 NOTE — ED Notes (Signed)
MD notified of abnormal vital signs.

## 2023-03-01 NOTE — ED Notes (Addendum)
Difficulty starting second IV/drawing blood cultures. IV team order placed. IV team at bedside.Phlebotomy notified/asked to obtain second set.

## 2023-03-01 NOTE — ED Notes (Signed)
Pt SpO2 dropping to 80s. Pt repositioned and placed on 2L [place].

## 2023-03-01 NOTE — ED Notes (Signed)
MD notified of continued difficulty obtaining blood cultures. Phlebotomy asked x2 to attempt obtaining blood cultures.

## 2023-03-01 NOTE — Sepsis Progress Note (Signed)
 Notified bedside nurse of need to draw repeat lactic acid.

## 2023-03-02 ENCOUNTER — Telehealth (INDEPENDENT_AMBULATORY_CARE_PROVIDER_SITE_OTHER): Payer: Medicare Other | Admitting: Psychiatry

## 2023-03-02 ENCOUNTER — Other Ambulatory Visit (HOSPITAL_COMMUNITY): Payer: Self-pay

## 2023-03-02 ENCOUNTER — Telehealth (HOSPITAL_COMMUNITY): Payer: Self-pay | Admitting: Pharmacy Technician

## 2023-03-02 ENCOUNTER — Inpatient Hospital Stay (HOSPITAL_COMMUNITY): Payer: Medicare Other

## 2023-03-02 ENCOUNTER — Encounter: Payer: Self-pay | Admitting: Psychiatry

## 2023-03-02 DIAGNOSIS — U071 COVID-19: Secondary | ICD-10-CM | POA: Diagnosis not present

## 2023-03-02 DIAGNOSIS — I4891 Unspecified atrial fibrillation: Secondary | ICD-10-CM | POA: Diagnosis not present

## 2023-03-02 DIAGNOSIS — J9601 Acute respiratory failure with hypoxia: Secondary | ICD-10-CM | POA: Diagnosis not present

## 2023-03-02 DIAGNOSIS — I5032 Chronic diastolic (congestive) heart failure: Secondary | ICD-10-CM

## 2023-03-02 DIAGNOSIS — Z91199 Patient's noncompliance with other medical treatment and regimen due to unspecified reason: Secondary | ICD-10-CM

## 2023-03-02 LAB — ECHOCARDIOGRAM COMPLETE
AV Mean grad: 4 mm[Hg]
AV Peak grad: 7.6 mm[Hg]
Ao pk vel: 1.38 m/s
Calc EF: 70.1 %
Height: 64 in
Single Plane A2C EF: 66.1 %
Single Plane A4C EF: 71.9 %
Weight: 4017.66 [oz_av]

## 2023-03-02 LAB — COMPREHENSIVE METABOLIC PANEL
ALT: 25 U/L (ref 0–44)
AST: 60 U/L — ABNORMAL HIGH (ref 15–41)
Albumin: 2.6 g/dL — ABNORMAL LOW (ref 3.5–5.0)
Alkaline Phosphatase: 42 U/L (ref 38–126)
Anion gap: 14 (ref 5–15)
BUN: 16 mg/dL (ref 8–23)
CO2: 19 mmol/L — ABNORMAL LOW (ref 22–32)
Calcium: 8.2 mg/dL — ABNORMAL LOW (ref 8.9–10.3)
Chloride: 101 mmol/L (ref 98–111)
Creatinine, Ser: 1.18 mg/dL — ABNORMAL HIGH (ref 0.44–1.00)
GFR, Estimated: 49 mL/min — ABNORMAL LOW (ref >60)
Glucose, Bld: 186 mg/dL — ABNORMAL HIGH (ref 70–99)
Potassium: 3.7 mmol/L (ref 3.5–5.1)
Sodium: 134 mmol/L — ABNORMAL LOW (ref 135–145)
Total Bilirubin: 1.4 mg/dL — ABNORMAL HIGH (ref 0.0–1.2)
Total Protein: 5 g/dL — ABNORMAL LOW (ref 6.5–8.1)

## 2023-03-02 LAB — URINALYSIS, ROUTINE W REFLEX MICROSCOPIC
Bilirubin Urine: NEGATIVE
Glucose, UA: NEGATIVE mg/dL
Ketones, ur: 5 mg/dL — AB
Nitrite: NEGATIVE
Protein, ur: 100 mg/dL — AB
Specific Gravity, Urine: 1.032 — ABNORMAL HIGH (ref 1.005–1.030)
WBC, UA: >50 WBC/hpf (ref 0–5)
pH: 5 (ref 5.0–8.0)

## 2023-03-02 LAB — CBC
HCT: 35 % — ABNORMAL LOW (ref 36.0–46.0)
Hemoglobin: 11.8 g/dL — ABNORMAL LOW (ref 12.0–15.0)
MCH: 32.9 pg (ref 26.0–34.0)
MCHC: 33.7 g/dL (ref 30.0–36.0)
MCV: 97.5 fL (ref 80.0–100.0)
Platelets: 128 10*3/uL — ABNORMAL LOW (ref 150–400)
RBC: 3.59 MIL/uL — ABNORMAL LOW (ref 3.87–5.11)
RDW: 13.1 % (ref 11.5–15.5)
WBC: 8.3 10*3/uL (ref 4.0–10.5)
nRBC: 0 % (ref 0.0–0.2)

## 2023-03-02 LAB — I-STAT ARTERIAL BLOOD GAS, ED
Acid-base deficit: 2 mmol/L (ref 0.0–2.0)
Bicarbonate: 23.4 mmol/L (ref 20.0–28.0)
Calcium, Ion: 1.18 mmol/L (ref 1.15–1.40)
HCT: 31 % — ABNORMAL LOW (ref 36.0–46.0)
Hemoglobin: 10.5 g/dL — ABNORMAL LOW (ref 12.0–15.0)
O2 Saturation: 99 %
Patient temperature: 99.9
Potassium: 3.3 mmol/L — ABNORMAL LOW (ref 3.5–5.1)
Sodium: 134 mmol/L — ABNORMAL LOW (ref 135–145)
TCO2: 25 mmol/L (ref 22–32)
pCO2 arterial: 41 mm[Hg] (ref 32–48)
pH, Arterial: 7.367 (ref 7.35–7.45)
pO2, Arterial: 157 mm[Hg] — ABNORMAL HIGH (ref 83–108)

## 2023-03-02 LAB — URINALYSIS, W/ REFLEX TO CULTURE (INFECTION SUSPECTED)
Bilirubin Urine: NEGATIVE
Glucose, UA: NEGATIVE mg/dL
Ketones, ur: 5 mg/dL — AB
Nitrite: NEGATIVE
Protein, ur: 30 mg/dL — AB
Specific Gravity, Urine: 1.028 (ref 1.005–1.030)
WBC, UA: >50 WBC/hpf (ref 0–5)
pH: 5 (ref 5.0–8.0)

## 2023-03-02 LAB — TSH: TSH: 1.053 u[IU]/mL (ref 0.350–4.500)

## 2023-03-02 LAB — MAGNESIUM: Magnesium: 1.9 mg/dL (ref 1.7–2.4)

## 2023-03-02 LAB — HEPARIN LEVEL (UNFRACTIONATED)
Heparin Unfractionated: 0.9 [IU]/mL — ABNORMAL HIGH (ref 0.30–0.70)
Heparin Unfractionated: 0.83 [IU]/mL — ABNORMAL HIGH (ref 0.30–0.70)

## 2023-03-02 MED ORDER — SODIUM CHLORIDE 0.9 % IV SOLN
1.0000 g | INTRAVENOUS | Status: AC
  Administered 2023-03-02 – 2023-03-06 (×5): 1 g via INTRAVENOUS
  Filled 2023-03-02 (×5): qty 10

## 2023-03-02 MED ORDER — POTASSIUM CHLORIDE CRYS ER 20 MEQ PO TBCR
20.0000 meq | EXTENDED_RELEASE_TABLET | Freq: Once | ORAL | Status: AC
  Administered 2023-03-02: 20 meq via ORAL
  Filled 2023-03-02: qty 1

## 2023-03-02 MED ORDER — FUROSEMIDE 10 MG/ML IJ SOLN
40.0000 mg | Freq: Once | INTRAMUSCULAR | Status: AC
  Administered 2023-03-02: 40 mg via INTRAVENOUS
  Filled 2023-03-02: qty 4

## 2023-03-02 MED ORDER — ENOXAPARIN SODIUM 120 MG/0.8ML IJ SOSY
110.0000 mg | PREFILLED_SYRINGE | Freq: Two times a day (BID) | INTRAMUSCULAR | Status: DC
  Administered 2023-03-02: 110 mg via SUBCUTANEOUS
  Filled 2023-03-02 (×2): qty 0.74

## 2023-03-02 MED ORDER — MAGNESIUM SULFATE 2 GM/50ML IV SOLN
2.0000 g | Freq: Once | INTRAVENOUS | Status: AC
  Administered 2023-03-02: 2 g via INTRAVENOUS
  Filled 2023-03-02: qty 50

## 2023-03-02 NOTE — Plan of Care (Signed)
  Problem: Education: Goal: Knowledge of risk factors and measures for prevention of condition will improve Outcome: Progressing   Problem: Coping: Goal: Psychosocial and spiritual needs will be supported Outcome: Progressing   Problem: Respiratory: Goal: Will maintain a patent airway Outcome: Progressing Goal: Complications related to the disease process, condition or treatment will be avoided or minimized Outcome: Progressing   Problem: Education: Goal: Knowledge of General Education information will improve Description: Including pain rating scale, medication(s)/side effects and non-pharmacologic comfort measures Outcome: Progressing   Problem: Health Behavior/Discharge Planning: Goal: Ability to manage health-related needs will improve Outcome: Progressing   Problem: Clinical Measurements: Goal: Ability to maintain clinical measurements within normal limits will improve Outcome: Progressing Goal: Will remain free from infection Outcome: Progressing Goal: Diagnostic test results will improve Outcome: Progressing Goal: Respiratory complications will improve Outcome: Progressing Goal: Cardiovascular complication will be avoided Outcome: Progressing   Problem: Activity: Goal: Risk for activity intolerance will decrease Outcome: Progressing   Problem: Nutrition: Goal: Adequate nutrition will be maintained Outcome: Progressing   Problem: Coping: Goal: Level of anxiety will decrease Outcome: Progressing   Problem: Elimination: Goal: Will not experience complications related to bowel motility Outcome: Progressing Goal: Will not experience complications related to urinary retention Outcome: Progressing   Problem: Pain Managment: Goal: General experience of comfort will improve and/or be controlled Outcome: Progressing   Problem: Safety: Goal: Ability to remain free from injury will improve Outcome: Progressing   Problem: Skin Integrity: Goal: Risk for impaired  skin integrity will decrease Outcome: Progressing   Problem: Education: Goal: Knowledge of disease or condition will improve Outcome: Progressing Goal: Understanding of medication regimen will improve Outcome: Progressing Goal: Individualized Educational Video(s) Outcome: Progressing   Problem: Activity: Goal: Ability to tolerate increased activity will improve Outcome: Progressing   Problem: Cardiac: Goal: Ability to achieve and maintain adequate cardiopulmonary perfusion will improve Outcome: Progressing   Problem: Health Behavior/Discharge Planning: Goal: Ability to safely manage health-related needs after discharge will improve Outcome: Progressing

## 2023-03-02 NOTE — Progress Notes (Signed)
ANTICOAGULATION CONSULT NOTE Pharmacy Consult for Heparin Indication: atrial fibrillation Brief A/P: Heparin level supratherapeutic Decrease Heparin rate  Allergies  Allergen Reactions   Morphine Nausea And Vomiting    Other reaction(s): Other (See Comments) Unspecified reaction    Other Other (See Comments)    Allergic to muscle relaxers -unknown reaction Flexeril only can take cyclobenzaprine.    Patient Measurements: Height: 5\' 4"  (162.6 cm) Weight: 113.9 kg (251 lb 1.7 oz) IBW/kg (Calculated) : 54.7 Heparin Dosing Weight: 82 kg  Vital Signs: Temp: 98.7 F (37.1 C) (02/20 0432) Temp Source: Oral (02/20 0432) BP: 105/80 (02/20 0415) Pulse Rate: 105 (02/20 0415)  Labs: Recent Labs    03/01/23 1447 03/02/23 0127 03/02/23 0400 03/02/23 0430  HGB 14.0 10.5*  --  11.8*  HCT 40.8 31.0*  --  35.0*  PLT 146*  --   --  128*  APTT 58*  --   --   --   LABPROT 14.2  --   --   --   INR 1.1  --   --   --   HEPARINUNFRC  --   --  0.90*  --   CREATININE 1.00  --   --   --   CKTOTAL 905*  --   --   --     Estimated Creatinine Clearance: 62 mL/min (by C-G formula based on SCr of 1 mg/dL).  Assessment: 73 y.o. female with Afib for heparin  Goal of Therapy:  Heparin level 0.3-0.7 units/ml Monitor platelets by anticoagulation protocol: Yes   Plan:  Decrease Heparin 1100 units/hr Check heparin level in 8 hours.  Geannie Risen, PharmD, BCPS

## 2023-03-02 NOTE — ED Notes (Addendum)
Pharmacy contacted regarding medication compatibility. Confirmed compatibility of heparin and diltiazem.

## 2023-03-02 NOTE — Progress Notes (Addendum)
ANTICOAGULATION CONSULT NOTE  Pharmacy Consult for Heparin>>Lovenox Indication: atrial fibrillation  Allergies  Allergen Reactions   Morphine Nausea And Vomiting    Other reaction(s): Other (See Comments) Unspecified reaction    Other Other (See Comments)    Allergic to muscle relaxers -unknown reaction Flexeril only can take cyclobenzaprine.    Patient Measurements: Height: 5\' 4"  (162.6 cm) Weight: 113.9 kg (251 lb 1.7 oz) IBW/kg (Calculated) : 54.7 Heparin Dosing Weight: 82 kg  Vital Signs: Temp: 98.3 F (36.8 C) (02/20 1527) Temp Source: Oral (02/20 1527) BP: 131/96 (02/20 1527) Pulse Rate: 110 (02/20 1359)  Labs: Recent Labs    03/01/23 1447 03/02/23 0127 03/02/23 0400 03/02/23 0430 03/02/23 1440  HGB 14.0 10.5*  --  11.8*  --   HCT 40.8 31.0*  --  35.0*  --   PLT 146*  --   --  128*  --   APTT 58*  --   --   --   --   LABPROT 14.2  --   --   --   --   INR 1.1  --   --   --   --   HEPARINUNFRC  --   --  0.90*  --  0.83*  CREATININE 1.00  --   --  1.18*  --   CKTOTAL 905*  --   --   --   --     Estimated Creatinine Clearance: 52.6 mL/min (A) (by C-G formula based on SCr of 1.18 mg/dL (H)).   Medical History: Past Medical History:  Diagnosis Date   ANEMIA 11/12/2007   Qualifier: Diagnosis of  By: Briscoe Burns CMA, Alvy Beal     Anxiety    Arthritis    ASTHMA NOS W/ACUTE EXACERBATION 01/13/2010   Qualifier: Diagnosis of  By: Yetta Barre MD, Bernadene Bell.    BIPOLAR DISORDER UNSPECIFIED 10/29/2007   Qualifier: Diagnosis of  By: Yetta Barre MD, Bernadene Bell.    COMMON MIGRAINE 12/29/2008   Qualifier: Diagnosis of  By: Yetta Barre MD, Bernadene Bell.    GERD 07/28/2009   Qualifier: Diagnosis of  By: Felicity Coyer MD, Raenette Rover    HYPERLIPIDEMIA 10/29/2007   Qualifier: Diagnosis of  By: Yetta Barre MD, Bernadene Bell.    HYPERTENSION, BENIGN ESSENTIAL 10/29/2007   Qualifier: Diagnosis of  By: Yetta Barre MD, Bernadene Bell.    OSTEOPENIA 10/30/2007   Qualifier: Diagnosis of  By: Yetta Barre MD, Bernadene Bell.    OVERACTIVE BLADDER  02/21/2008   Qualifier: Diagnosis of  By: Jonny Ruiz MD, Len Blalock    SLEEP APNEA 12/31/2009   Qualifier: Diagnosis of  By: Yetta Barre MD, Bernadene Bell.     Medications:  Medications Prior to Admission  Medication Sig Dispense Refill Last Dose/Taking   amLODipine (NORVASC) 2.5 MG tablet Take 1 tablet (2.5 mg total) by mouth daily. 90 tablet 1 Taking   cimetidine (TAGAMET) 200 MG tablet Take 200 mg by mouth daily.   Taking   diphenoxylate-atropine (LOMOTIL) 2.5-0.025 MG tablet TAKE ONE TABLET FOUR TIMES DAILY AS NEEDED FOR diarrhea/loose stools 30 tablet 2 Taking   doxepin (SINEQUAN) 10 MG capsule Take 1 capsule (10 mg total) by mouth at bedtime. 90 capsule 0 Taking   famotidine (PEPCID) 20 MG tablet Take 20 mg by mouth daily.   Taking   furosemide (LASIX) 40 MG tablet Take 1 tablet (40 mg total) by mouth daily. (Patient taking differently: Take 40 mg by mouth 2 (two) times daily.) 90 tablet 1 Taking Differently   oxybutynin (DITROPAN) 5 MG tablet  Take 1 tablet (5 mg total) by mouth 2 (two) times daily. 180 tablet 1 Taking   promethazine (PHENERGAN) 25 MG tablet TAKE ONE TABLET BY MOUTH EVERY 8 HOURS AS NEEDED FOR NAUSEA AND VOMITING 90 tablet 0 Taking   solifenacin (VESICARE) 10 MG tablet Take 10 mg by mouth daily.   Taking   atorvastatin (LIPITOR) 40 MG tablet Take 1 tablet (40 mg total) by mouth daily. 90 tablet 3    benzoyl peroxide 5 % external liquid Apply topically 2 (two) times daily. Please help her find over-the-counter if not covered by the insurance.  If not available please let us know we can see if the insurance will cover BenzaClin topical. 142 g 12    clindamycin (CLINDAGEL) 1 % gel Apply topically 2 (two) times daily. X 10 days 30 g 5    divalproex (DEPAKOTE ER) 500 MG 24 hr tablet Take 3 tablets (1,500 mg total) by mouth at bedtime. 270 tablet 1    labetalol (NORMODYNE) 200 MG tablet TAKE 1 AND 1/2 TABLETS BY MOUTH IN THE MORNING AND TAKE ONE TABLET IN THE EVENING (Patient taking differently:  Take 200-300 mg by mouth See admin instructions. Take 1.5 tablets (300mg ) by mouth every morning and then take 1 tablet (200mg ) by mouth every evening) 225 tablet 1    nitrofurantoin, macrocrystal-monohydrate, (MACROBID) 100 MG capsule Take 1 capsule (100 mg total) by mouth 2 (two) times daily. 10 capsule 0    pregabalin (LYRICA) 50 MG capsule Take 1 capsule (50 mg total) by mouth 3 (three) times daily. 270 capsule 1    Scheduled:   dexamethasone (DECADRON) injection  6 mg Intravenous Q24H   divalproex  1,500 mg Oral QHS   famotidine  20 mg Oral Daily   pregabalin  50 mg Oral TID   Infusions:   amiodarone 30 mg/hr (03/02/23 1538)   cefTRIAXone (ROCEPHIN)  IV Stopped (03/02/23 1241)   diltiazem (CARDIZEM) infusion 10 mg/hr (03/02/23 1034)   heparin 1,100 Units/hr (03/02/23 1539)   PRN: acetaminophen **OR** acetaminophen, bisacodyl, ondansetron **OR** ondansetron (ZOFRAN) IV  Assessment: 84 yof with a history of HF, HTN, HLD, DM. Patient is presenting with fall. Found to be in AF w/ RVR in the ED. Heparin per pharmacy consult placed for atrial fibrillation.  Patient is not on anticoagulation prior to arrival.  Heparin level came back supratherapeutic again. Ok to transition to Lovenox today before going to NOAC per Dr. Jomarie Longs. Hgb 11.8, plt <150k  Goal of Therapy:  Anti-Xa: 0.6-1 Monitor platelets by anticoagulation protocol: Yes   Plan:  Dc heparin Lovenox 110mg  SQ BID F/u PO NOAC in AM  Ulyses Southward, PharmD, BCIDP, AAHIVP, CPP Infectious Disease Pharmacist 03/02/2023 3:52 PM

## 2023-03-02 NOTE — Progress Notes (Signed)
Rounding Note    Patient Name: Donna Sexton Date of Encounter: 03/02/2023  [facility] Cardiologist: None new  Subjective   Patient awake but confused. Denies SOB or chest pain  Inpatient Medications    Scheduled Meds:  dexamethasone (DECADRON) injection  6 mg Intravenous Q24H   divalproex  1,500 mg Oral QHS   famotidine  20 mg Oral Daily   pregabalin  50 mg Oral TID   Continuous Infusions:  acetaminophen Stopped (03/02/23 0724)   amiodarone 30 mg/hr (03/02/23 0352)   diltiazem (CARDIZEM) infusion 10 mg/hr (03/02/23 0352)   heparin 1,100 Units/hr (03/02/23 0507)   remdesivir 100 mg in sodium chloride 0.9 % 100 mL IVPB     PRN Meds: acetaminophen **OR** acetaminophen, bisacodyl, ondansetron **OR** ondansetron (ZOFRAN) IV   Vital Signs    Vitals:   03/02/23 0500 03/02/23 0515 03/02/23 0530 03/02/23 0545  BP: 136/83  117/74   Pulse: (!) 113 (!) 102 (!) 116 (!) 101  Resp: (!) 24  (!) 23 (!) 21  Temp:      TempSrc:      SpO2: 96%  97% 99%  Weight:      Height:        Intake/Output Summary (Last 24 hours) at 03/02/2023 0827 Last data filed at 03/02/2023 0100 Gross per 24 hour  Intake 1336.44 ml  Output --  Net 1336.44 ml      03/01/2023    7:52 PM 02/16/2023   10:04 AM 11/16/2022    8:39 AM  Last 3 Weights  Weight (lbs) 251 lb 1.7 oz 251 lb 250 lb  Weight (kg) 113.9 kg 113.853 kg 113.399 kg      Telemetry    Afib with rate 110 - Personally Reviewed  ECG    None today - Personally Reviewed  Physical Exam   GEN: elderly, obese Neck: No JVD Cardiac: IRRR, no murmurs, rubs, or gallops.  Respiratory: reduced BS in bases GI: Soft, nontender, non-distended  MS: No pitting  edema; No deformity. Neuro:  oriented to self only. Follows commands Psych: Normal affect   Labs    High Sensitivity Troponin:  No results for input(s): "TROPONINIHS" in the last 720 hours.   Chemistry Recent Labs  Lab 03/01/23 1447 03/02/23 0127 03/02/23 0430   NA 135 134* 134*  K 3.5 3.3* 3.7  CL 98  --  101  CO2 23  --  19*  GLUCOSE 131*  --  186*  BUN 18  --  16  CREATININE 1.00  --  1.18*  CALCIUM 9.1  --  8.2*  MG  --   --  1.9  PROT 5.9*  --  5.0*  ALBUMIN 3.2*  --  2.6*  AST 69*  --  60*  ALT 32  --  25  ALKPHOS 47  --  42  BILITOT 2.3*  --  1.4*  GFRNONAA 59*  --  49*  ANIONGAP 14  --  14    Lipids No results for input(s): "CHOL", "TRIG", "HDL", "LABVLDL", "LDLCALC", "CHOLHDL" in the last 168 hours.  Hematology Recent Labs  Lab 03/01/23 1447 03/02/23 0127 03/02/23 0430  WBC 10.2  --  8.3  RBC 4.20  --  3.59*  HGB 14.0 10.5* 11.8*  HCT 40.8 31.0* 35.0*  MCV 97.1  --  97.5  MCH 33.3  --  32.9  MCHC 34.3  --  33.7  RDW 12.7  --  13.1  PLT 146*  --  128*   Thyroid  Recent Labs  Lab 03/02/23 0430  TSH 1.053    BNPNo results for input(s): "BNP", "PROBNP" in the last 168 hours.  DDimer No results for input(s): "DDIMER" in the last 168 hours.   Radiology    CT Head Wo Contrast Result Date: 03/01/2023 CLINICAL DATA:  Fever generalized weakness found down EXAM: CT HEAD WITHOUT CONTRAST CT CERVICAL SPINE WITHOUT CONTRAST TECHNIQUE: Multidetector CT imaging of the head and cervical spine was performed following the standard protocol without intravenous contrast. Multiplanar CT image reconstructions of the cervical spine were also generated. RADIATION DOSE REDUCTION: This exam was performed according to the departmental dose-optimization program which includes automated exposure control, adjustment of the mA and/or kV according to patient size and/or use of iterative reconstruction technique. COMPARISON:  CT brain 02/01/2010 FINDINGS: CT HEAD FINDINGS Brain: Significant motion degradation limits the exam. No hemorrhage is visualized. Atrophy and mild chronic small vessel ischemic changes of the white matter. Nonenlarged ventricles. Vascular: No hyperdense vessels.  No unexpected calcification Skull: No gross fracture  Sinuses/Orbits: Postsurgical changes of the anterior maxillary sinus walls. Patchy mucosal thickening in the ethmoid sinuses. Fluid level left maxillary sinus. Right inferomedial slightly dense right orbital mass measuring 1.9 x 1.3 cm. Other: None CT CERVICAL SPINE FINDINGS Alignment: Significant motion degradation. Reversal of cervical lordosis. Trace anterolisthesis C2 on C3, C3 on C4 and C4 on C5. Facet alignment grossly within normal limits Skull base and vertebrae: No obvious fracture. Soft tissues and spinal canal: Indistinct prevertebral soft tissues, largely degraded by motion Disc levels: Moderate severe disc space narrowing C4-C5, C5-C6 and C6-C7. Multilevel facet degenerative changes with foraminal narrowing. Upper chest: Lung apices are clear Other: None IMPRESSION: 1. Significant motion degradation limits the exam. No acute intracranial abnormality is seen. Atrophy and chronic small vessel ischemic changes of the white matter. 2. Reversal of cervical lordosis with trace anterolisthesis C2 on C3, C3 on C4 and C4 on C5, likely degenerative. No definitive fracture is seen. Indistinct prevertebral soft tissues, largely degraded by motion but difficult to exclude prevertebral edema. Repeat CT when patient able to remain still, versus MRI attempt could be considered if high clinical suspicion for acute cervical injury. 3. 1.9 cm right inferomedial orbital mass, recommend further evaluation with nonemergent orbital MRI with and without contrast. 4. Fluid level in the left maxillary sinus, correlate for acute sinusitis. Electronically Signed   By: Jasmine Pang M.D.   On: 03/01/2023 19:17   CT Cervical Spine Wo Contrast Result Date: 03/01/2023 CLINICAL DATA:  Fever generalized weakness found down EXAM: CT HEAD WITHOUT CONTRAST CT CERVICAL SPINE WITHOUT CONTRAST TECHNIQUE: Multidetector CT imaging of the head and cervical spine was performed following the standard protocol without intravenous contrast.  Multiplanar CT image reconstructions of the cervical spine were also generated. RADIATION DOSE REDUCTION: This exam was performed according to the departmental dose-optimization program which includes automated exposure control, adjustment of the mA and/or kV according to patient size and/or use of iterative reconstruction technique. COMPARISON:  CT brain 02/01/2010 FINDINGS: CT HEAD FINDINGS Brain: Significant motion degradation limits the exam. No hemorrhage is visualized. Atrophy and mild chronic small vessel ischemic changes of the white matter. Nonenlarged ventricles. Vascular: No hyperdense vessels.  No unexpected calcification Skull: No gross fracture Sinuses/Orbits: Postsurgical changes of the anterior maxillary sinus walls. Patchy mucosal thickening in the ethmoid sinuses. Fluid level left maxillary sinus. Right inferomedial slightly dense right orbital mass measuring 1.9 x 1.3 cm. Other: None CT CERVICAL SPINE  FINDINGS Alignment: Significant motion degradation. Reversal of cervical lordosis. Trace anterolisthesis C2 on C3, C3 on C4 and C4 on C5. Facet alignment grossly within normal limits Skull base and vertebrae: No obvious fracture. Soft tissues and spinal canal: Indistinct prevertebral soft tissues, largely degraded by motion Disc levels: Moderate severe disc space narrowing C4-C5, C5-C6 and C6-C7. Multilevel facet degenerative changes with foraminal narrowing. Upper chest: Lung apices are clear Other: None IMPRESSION: 1. Significant motion degradation limits the exam. No acute intracranial abnormality is seen. Atrophy and chronic small vessel ischemic changes of the white matter. 2. Reversal of cervical lordosis with trace anterolisthesis C2 on C3, C3 on C4 and C4 on C5, likely degenerative. No definitive fracture is seen. Indistinct prevertebral soft tissues, largely degraded by motion but difficult to exclude prevertebral edema. Repeat CT when patient able to remain still, versus MRI attempt could  be considered if high clinical suspicion for acute cervical injury. 3. 1.9 cm right inferomedial orbital mass, recommend further evaluation with nonemergent orbital MRI with and without contrast. 4. Fluid level in the left maxillary sinus, correlate for acute sinusitis. Electronically Signed   By: Jasmine Pang M.D.   On: 03/01/2023 19:17   DG Chest Port 1 View Result Date: 03/01/2023 CLINICAL DATA:  Question or sepsis. EXAM: PORTABLE CHEST 1 VIEW COMPARISON:  Chest radiograph dated 06/06/2011 FINDINGS: The shallow inspiration with minimal bibasilar atelectasis. No consolidative changes, pleural effusion, pneumothorax. The cardiac silhouette is within normal limits. No acute osseous pathology. IMPRESSION: Minimal bibasilar atelectasis. No focal consolidation. Electronically Signed   By: Elgie Collard M.D.   On: 03/01/2023 15:45    Cardiac Studies   Echo 2021: SUMMARY  The left ventricular size is normal.  There is normal left ventricular wall thickness.  Left ventricular systolic function is normal.  LV ejection fraction = 55-60%.  No segmental wall motion abnormalities seen in the left ventricle  The right ventricle is normal size.  The right ventricular systolic function is normal.  The left atrium is moderately dilated.  No significant valvular stenosis or regurgitation.  IVC size was normal.  There is no pericardial effusion.  Probably no significant change from prior study.      Nuclear stress test 02/2020: 1.  No inducible transmural ischemia or transmural scar.  2.  No wall motion abnormalities or transient ischemic dilation.  3.  Left ventricular ejection fraction measures approximately 58% during stress.     Patient Profile     73 y.o. 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 Rhae Hammock.    Assessment & Plan    Atrial fibrillation with RVR -Appears to be a new diagnosis this admission in the setting of sepsis secondary to COVID infection +/- bacteremia - on IV amiodarone  - on IV Cardizem now at 10 mg/hr - rate control improved - we will check Echo now that rate better - anticoagulated with IV heparin for now. Consider switching to oral DOAC once able to take PO - uncertain of swallowing status. Would continue IV meds for now. Can transition to po once able to swallow   Sepsis secondary to COVID infection +/- UTI - sepsis protocol initiated - elevated lactic acid 3.0 and CK 905. Lactate improved to 1.2 - cultures pending - on steroids and Remdesivir per primary team   Recent UTI - treated by  PCP - bactrim 02/16/23 - repeat cultures pending - UA with many bacteria and mod leucocytes.   Chronic diastolic heart failure - maintained on 40 mg lasix at home - holding this for now - suspect she is volume depleted with prolonged down time and rhabdo   Hypertension BP improved on IV diltiazem         For questions or updates, please contact [facility] Please consult [email or web address] for contact info under        Signed, Bardia Wangerin [place], MD  03/02/2023, 8:27 AM

## 2023-03-02 NOTE — Telephone Encounter (Signed)
Patient Product/process development scientist completed.    The patient is insured through [facility]. Patient has Medicare and is not eligible for a copay card, but may be able to apply for patient assistance or Medicare RX Payment Plan (Patient Must reach out to their plan, if eligible for payment plan), if available.    Ran test claim for Eliquis 5 mg and the current 30 day co-pay is $420.00 due to a $375.00 deductible.  Will be $45.00 once deductible is met.  Ran test claim for Xarelto 20 mg and the current 30 day co-pay is $420.00 due to a $375.00 deductible.  Will be $45.00 once deductible is met.  This test claim was processed through [facility]- copay amounts may vary at other pharmacies due to pharmacy/plan contracts, or as the patient moves through the different stages of their insurance plan.     Donna Sexton, CPHT Pharmacy Technician III Certified Patient Advocate [facility] Patient Advocate Team Direct Number: [phone]  Fax: [phone]

## 2023-03-02 NOTE — Progress Notes (Addendum)
PROGRESS NOTE    Donna Sexton  ZOX:[id number] DOB: 10-Sep-1950 DOA: 03/01/2023 PCP: Agapito Games, MD  72/F with obesity, chronic diastolic CHF, hypertension, dyslipidemia, type 2 diabetes mellitus, H/o UTIs was brought to the ED after she was found down by family, last spoke to her ex-husband 24 hours prior, lives alone and is fairly independent.  Unable to recall the events of the last few days, very poor historian this morning. -In the ED she was febrile to 101, A-fib RVR, tachycardic in the 160s, blood pressure 130/87, initially sats were 93% on room air, subsequently placed on 2 to 3 L O2, workup noted sodium 135, creatinine 1.0, CK9 06, hemoglobin 14, she was positive for SARS COVID PCR, UA with many bacteria, greater than 50 WBC  Subjective: -Patient seen in the ED, ex-husband/friend at bedside  Assessment and Plan:  Atrial fibrillation with RVR -In the setting of COVID -New diagnosis, cards following currently on IV amiodarone and Cardizem gtt., she appears to be on labetalol at baseline -Plan for echo today -Continue IV heparin, transition to DOAC tomorrow  Acute on chronic diastolic CHF -Clinically appears slightly volume overloaded, likely triggered by A-fib RVR, Lasix 40 Mg X1 -Last echo 2021 with preserved EF, ventricular compaction on cardiac MRI -Check echo -Albumin is 2.6, likely contributing to third spacing as well  SARS COVID-19 positive -Doubt her hypoxia is secondary to COVID, x-ray with did not show bilateral infiltrates, basilar atelectasis only -Started on dexamethasone overnight in the ED, will continue for now -Do not think she needs remdesivir therapy, will DC -Check CRP, ferritin in a.m.  Confusion, encephalopathy -Likely secondary to fever, COVID -UA is also abnormal with known history of UTIs, poor historian, unable to clarify symptoms, start ceftriaxone, follow-up urine cultures --Increase activity, PT OT eval  Anxiety, bipolar  disorder -Resume Depakote  Type 2 diabetes mellitus -Recent A1c was 6.2, CBGs are stable  Right orbital mass -Incidentally noted on imaging -Will need MRI for this as outpatient  DVT prophylaxis: IV heparin Code Status: Full code Family Communication: Ex-husband and friend at bedside Disposition Plan: To be determined  Consultants: Cards   Procedures:   Antimicrobials:    Objective: Vitals:   03/02/23 0515 03/02/23 0530 03/02/23 0545 03/02/23 0831  BP:  117/74  (!) 150/93  Pulse: (!) 102 (!) 116 (!) 101 98  Resp:  (!) 23 (!) 21 (!) 26  Temp:    98.6 F (37 C)  TempSrc:      SpO2:  97% 99% 100%  Weight:      Height:        Intake/Output Summary (Last 24 hours) at 03/02/2023 1008 Last data filed at 03/02/2023 0100 Gross per 24 hour  Intake 1336.44 ml  Output --  Net 1336.44 ml   Filed Weights   03/01/23 1952  Weight: 113.9 kg    Examination:  General exam: Obese chronically ill female laying in bed, awake alert, oriented to self and partly to place, cognitive deficits noted HEENT: Neck obese unable to assess JVD CVS: S1-S2, irregular rhythm, tachycardic Lungs: Decreased breath sounds at the bases, few Rales Abdomen: Soft, nontender, bowel sounds present Extremities: 1+ edema  Skin: No rashes Psychiatry: Flat affect, poor insight at this time    Data Reviewed:   CBC: Recent Labs  Lab 03/01/23 1447 03/02/23 0127 03/02/23 0430  WBC 10.2  --  8.3  NEUTROABS 6.3  --   --   HGB 14.0 10.5* 11.8*  HCT 40.8  31.0* 35.0*  MCV 97.1  --  97.5  PLT 146*  --  128*   Basic Metabolic Panel: Recent Labs  Lab 03/01/23 1447 03/02/23 0127 03/02/23 0430  NA 135 134* 134*  K 3.5 3.3* 3.7  CL 98  --  101  CO2 23  --  19*  GLUCOSE 131*  --  186*  BUN 18  --  16  CREATININE 1.00  --  1.18*  CALCIUM 9.1  --  8.2*  MG  --   --  1.9   GFR: Estimated Creatinine Clearance: 52.6 mL/min (A) (by C-G formula based on SCr of 1.18 mg/dL (H)). Liver Function  Tests: Recent Labs  Lab 03/01/23 1447 03/02/23 0430  AST 69* 60*  ALT 32 25  ALKPHOS 47 42  BILITOT 2.3* 1.4*  PROT 5.9* 5.0*  ALBUMIN 3.2* 2.6*   No results for input(s): "LIPASE", "AMYLASE" in the last 168 hours. No results for input(s): "AMMONIA" in the last 168 hours. Coagulation Profile: Recent Labs  Lab 03/01/23 1447  INR 1.1   Cardiac Enzymes: Recent Labs  Lab 03/01/23 1447  CKTOTAL 905*   BNP (last 3 results) No results for input(s): "PROBNP" in the last 8760 hours. HbA1C: No results for input(s): "HGBA1C" in the last 72 hours. CBG: No results for input(s): "GLUCAP" in the last 168 hours. Lipid Profile: No results for input(s): "CHOL", "HDL", "LDLCALC", "TRIG", "CHOLHDL", "LDLDIRECT" in the last 72 hours. Thyroid Function Tests: Recent Labs    03/02/23 0430  TSH 1.053   Anemia Panel: No results for input(s): "VITAMINB12", "FOLATE", "FERRITIN", "TIBC", "IRON", "RETICCTPCT" in the last 72 hours. Urine analysis:    Component Value Date/Time   COLORURINE AMBER (A) 03/02/2023 0040   APPEARANCEUR HAZY (A) 03/02/2023 0040   LABSPEC 1.032 (H) 03/02/2023 0040   PHURINE 5.0 03/02/2023 0040   GLUCOSEU NEGATIVE 03/02/2023 0040   GLUCOSEU NEGATIVE 10/23/2009 1116   HGBUR MODERATE (A) 03/02/2023 0040   BILIRUBINUR NEGATIVE 03/02/2023 0040   BILIRUBINUR large (A) 02/16/2023 1014   BILIRUBINUR neg 07/05/2016 1113   KETONESUR 5 (A) 03/02/2023 0040   PROTEINUR 100 (A) 03/02/2023 0040   UROBILINOGEN >=8.0 (A) 02/16/2023 1014   UROBILINOGEN 1.0 02/01/2010 1926   NITRITE NEGATIVE 03/02/2023 0040   LEUKOCYTESUR MODERATE (A) 03/02/2023 0040   Sepsis Labs: @LABRCNTIP (procalcitonin:4,lacticidven:4)  ) Recent Results (from the past 240 hours)  Blood Culture (routine x 2)     Status: None (Preliminary result)   Collection Time: 03/01/23  4:01 PM   Specimen: BLOOD RIGHT ARM  Result Value Ref Range Status   Specimen Description BLOOD RIGHT ARM  Final   Special  Requests   Final    BOTTLES DRAWN AEROBIC ONLY Blood Culture results may not be optimal due to an inadequate volume of blood received in culture bottles   Culture   Final    NO GROWTH < 12 HOURS Performed at [facility], [address] [place], [place] [address]    Report Status PENDING  Incomplete  Blood Culture (routine x 2)     Status: None (Preliminary result)   Collection Time: 03/01/23  4:40 PM   Specimen: BLOOD RIGHT ARM  Result Value Ref Range Status   Specimen Description BLOOD RIGHT ARM  Final   Special Requests   Final    BOTTLES DRAWN AEROBIC AND ANAEROBIC Blood Culture adequate volume   Culture   Final    NO GROWTH < 24 HOURS Performed at [facility]  Lab, [address] [place], [place] [address]    Report Status PENDING  Incomplete  Resp panel by RT-PCR (RSV, Flu A&B, Covid) Anterior Nasal Swab     Status: Abnormal   Collection Time: 03/01/23  4:48 PM   Specimen: Anterior Nasal Swab  Result Value Ref Range Status   SARS Coronavirus 2 by RT PCR POSITIVE (A) NEGATIVE Final   Influenza A by PCR NEGATIVE NEGATIVE Final   Influenza B by PCR NEGATIVE NEGATIVE Final    Comment: (NOTE) The Xpert Xpress SARS-CoV-2/FLU/RSV plus assay is intended as an aid in the diagnosis of influenza from Nasopharyngeal swab specimens and should not be used as a sole basis for treatment. Nasal washings and aspirates are unacceptable for Xpert Xpress SARS-CoV-2/FLU/RSV testing.  Fact Sheet for Patients: BloggerCourse.com  Fact Sheet for Healthcare Providers: SeriousBroker.it  This test is not yet approved or cleared by the [place] FDA and has been authorized for detection and/or diagnosis of SARS-CoV-2 by FDA under an Emergency Use Authorization (EUA). This EUA will remain in effect (meaning this test can be used) for the duration of the COVID-19 declaration under Section 564(b)(1) of the Act, 21 U.S.C. section  360bbb-3(b)(1), unless the authorization is terminated or revoked.     Resp Syncytial Virus by PCR NEGATIVE NEGATIVE Final    Comment: (NOTE) Fact Sheet for Patients: BloggerCourse.com  Fact Sheet for Healthcare Providers: SeriousBroker.it  This test is not yet approved or cleared by the [place] FDA and has been authorized for detection and/or diagnosis of SARS-CoV-2 by FDA under an Emergency Use Authorization (EUA). This EUA will remain in effect (meaning this test can be used) for the duration of the COVID-19 declaration under Section 564(b)(1) of the Act, 21 U.S.C. section 360bbb-3(b)(1), unless the authorization is terminated or revoked.  Performed at [facility], [address] [place], [place] [address]      Radiology Studies: CT Head Wo Contrast Result Date: 03/01/2023 CLINICAL DATA:  Fever generalized weakness found down EXAM: CT HEAD WITHOUT CONTRAST CT CERVICAL SPINE WITHOUT CONTRAST TECHNIQUE: Multidetector CT imaging of the head and cervical spine was performed following the standard protocol without intravenous contrast. Multiplanar CT image reconstructions of the cervical spine were also generated. RADIATION DOSE REDUCTION: This exam was performed according to the departmental dose-optimization program which includes automated exposure control, adjustment of the mA and/or kV according to patient size and/or use of iterative reconstruction technique. COMPARISON:  CT brain 02/01/2010 FINDINGS: CT HEAD FINDINGS Brain: Significant motion degradation limits the exam. No hemorrhage is visualized. Atrophy and mild chronic small vessel ischemic changes of the white matter. Nonenlarged ventricles. Vascular: No hyperdense vessels.  No unexpected calcification Skull: No gross fracture Sinuses/Orbits: Postsurgical changes of the anterior maxillary sinus walls. Patchy mucosal thickening in the ethmoid sinuses. Fluid level left  maxillary sinus. Right inferomedial slightly dense right orbital mass measuring 1.9 x 1.3 cm. Other: None CT CERVICAL SPINE FINDINGS Alignment: Significant motion degradation. Reversal of cervical lordosis. Trace anterolisthesis C2 on C3, C3 on C4 and C4 on C5. Facet alignment grossly within normal limits Skull base and vertebrae: No obvious fracture. Soft tissues and spinal canal: Indistinct prevertebral soft tissues, largely degraded by motion Disc levels: Moderate severe disc space narrowing C4-C5, C5-C6 and C6-C7. Multilevel facet degenerative changes with foraminal narrowing. Upper chest: Lung apices are clear Other: None IMPRESSION: 1. Significant motion degradation limits the exam. No acute intracranial abnormality is seen. Atrophy and chronic small vessel ischemic changes of  the white matter. 2. Reversal of cervical lordosis with trace anterolisthesis C2 on C3, C3 on C4 and C4 on C5, likely degenerative. No definitive fracture is seen. Indistinct prevertebral soft tissues, largely degraded by motion but difficult to exclude prevertebral edema. Repeat CT when patient able to remain still, versus MRI attempt could be considered if high clinical suspicion for acute cervical injury. 3. 1.9 cm right inferomedial orbital mass, recommend further evaluation with nonemergent orbital MRI with and without contrast. 4. Fluid level in the left maxillary sinus, correlate for acute sinusitis. Electronically Signed   By: Jasmine Pang M.D.   On: 03/01/2023 19:17   CT Cervical Spine Wo Contrast Result Date: 03/01/2023 CLINICAL DATA:  Fever generalized weakness found down EXAM: CT HEAD WITHOUT CONTRAST CT CERVICAL SPINE WITHOUT CONTRAST TECHNIQUE: Multidetector CT imaging of the head and cervical spine was performed following the standard protocol without intravenous contrast. Multiplanar CT image reconstructions of the cervical spine were also generated. RADIATION DOSE REDUCTION: This exam was performed according to the  departmental dose-optimization program which includes automated exposure control, adjustment of the mA and/or kV according to patient size and/or use of iterative reconstruction technique. COMPARISON:  CT brain 02/01/2010 FINDINGS: CT HEAD FINDINGS Brain: Significant motion degradation limits the exam. No hemorrhage is visualized. Atrophy and mild chronic small vessel ischemic changes of the white matter. Nonenlarged ventricles. Vascular: No hyperdense vessels.  No unexpected calcification Skull: No gross fracture Sinuses/Orbits: Postsurgical changes of the anterior maxillary sinus walls. Patchy mucosal thickening in the ethmoid sinuses. Fluid level left maxillary sinus. Right inferomedial slightly dense right orbital mass measuring 1.9 x 1.3 cm. Other: None CT CERVICAL SPINE FINDINGS Alignment: Significant motion degradation. Reversal of cervical lordosis. Trace anterolisthesis C2 on C3, C3 on C4 and C4 on C5. Facet alignment grossly within normal limits Skull base and vertebrae: No obvious fracture. Soft tissues and spinal canal: Indistinct prevertebral soft tissues, largely degraded by motion Disc levels: Moderate severe disc space narrowing C4-C5, C5-C6 and C6-C7. Multilevel facet degenerative changes with foraminal narrowing. Upper chest: Lung apices are clear Other: None IMPRESSION: 1. Significant motion degradation limits the exam. No acute intracranial abnormality is seen. Atrophy and chronic small vessel ischemic changes of the white matter. 2. Reversal of cervical lordosis with trace anterolisthesis C2 on C3, C3 on C4 and C4 on C5, likely degenerative. No definitive fracture is seen. Indistinct prevertebral soft tissues, largely degraded by motion but difficult to exclude prevertebral edema. Repeat CT when patient able to remain still, versus MRI attempt could be considered if high clinical suspicion for acute cervical injury. 3. 1.9 cm right inferomedial orbital mass, recommend further evaluation with  nonemergent orbital MRI with and without contrast. 4. Fluid level in the left maxillary sinus, correlate for acute sinusitis. Electronically Signed   By: Jasmine Pang M.D.   On: 03/01/2023 19:17   DG Chest Port 1 View Result Date: 03/01/2023 CLINICAL DATA:  Question or sepsis. EXAM: PORTABLE CHEST 1 VIEW COMPARISON:  Chest radiograph dated 06/06/2011 FINDINGS: The shallow inspiration with minimal bibasilar atelectasis. No consolidative changes, pleural effusion, pneumothorax. The cardiac silhouette is within normal limits. No acute osseous pathology. IMPRESSION: Minimal bibasilar atelectasis. No focal consolidation. Electronically Signed   By: Elgie Collard M.D.   On: 03/01/2023 15:45     Scheduled Meds:  dexamethasone (DECADRON) injection  6 mg Intravenous Q24H   divalproex  1,500 mg Oral QHS   famotidine  20 mg Oral Daily   pregabalin  50 mg Oral  TID   Continuous Infusions:  acetaminophen Stopped (03/02/23 0724)   amiodarone 30 mg/hr (03/02/23 0352)   diltiazem (CARDIZEM) infusion 10 mg/hr (03/02/23 0352)   heparin 1,100 Units/hr (03/02/23 0507)   remdesivir 100 mg in sodium chloride 0.9 % 100 mL IVPB       LOS: 1 day    Time spent:    Zannie Cove, MD [facility]   03/02/2023, 10:08 AM
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
## 2023-03-02 NOTE — ED Notes (Addendum)
RT paged/asked to obtain ABG on patient. RT states they would be there shortly.

## 2023-03-03 ENCOUNTER — Telehealth: Payer: Self-pay

## 2023-03-03 DIAGNOSIS — I5032 Chronic diastolic (congestive) heart failure: Secondary | ICD-10-CM | POA: Diagnosis not present

## 2023-03-03 DIAGNOSIS — J9601 Acute respiratory failure with hypoxia: Secondary | ICD-10-CM | POA: Diagnosis not present

## 2023-03-03 DIAGNOSIS — U071 COVID-19: Secondary | ICD-10-CM | POA: Diagnosis not present

## 2023-03-03 DIAGNOSIS — I4891 Unspecified atrial fibrillation: Secondary | ICD-10-CM | POA: Diagnosis not present

## 2023-03-03 LAB — CBC
HCT: 35.6 % — ABNORMAL LOW (ref 36.0–46.0)
Hemoglobin: 12.5 g/dL (ref 12.0–15.0)
MCH: 33.4 pg (ref 26.0–34.0)
MCHC: 35.1 g/dL (ref 30.0–36.0)
MCV: 95.2 fL (ref 80.0–100.0)
Platelets: 141 10*3/uL — ABNORMAL LOW (ref 150–400)
RBC: 3.74 MIL/uL — ABNORMAL LOW (ref 3.87–5.11)
RDW: 13 % (ref 11.5–15.5)
WBC: 5.8 10*3/uL (ref 4.0–10.5)
nRBC: 0 % (ref 0.0–0.2)

## 2023-03-03 LAB — URINE CULTURE: Culture: NO GROWTH

## 2023-03-03 LAB — COMPREHENSIVE METABOLIC PANEL
ALT: 25 U/L (ref 0–44)
AST: 45 U/L — ABNORMAL HIGH (ref 15–41)
Albumin: 2.8 g/dL — ABNORMAL LOW (ref 3.5–5.0)
Alkaline Phosphatase: 38 U/L (ref 38–126)
Anion gap: 11 (ref 5–15)
BUN: 25 mg/dL — ABNORMAL HIGH (ref 8–23)
CO2: 24 mmol/L (ref 22–32)
Calcium: 8.6 mg/dL — ABNORMAL LOW (ref 8.9–10.3)
Chloride: 99 mmol/L (ref 98–111)
Creatinine, Ser: 0.96 mg/dL (ref 0.44–1.00)
GFR, Estimated: >60 mL/min (ref >60)
Glucose, Bld: 172 mg/dL — ABNORMAL HIGH (ref 70–99)
Potassium: 4 mmol/L (ref 3.5–5.1)
Sodium: 134 mmol/L — ABNORMAL LOW (ref 135–145)
Total Bilirubin: 0.6 mg/dL (ref 0.0–1.2)
Total Protein: 5.6 g/dL — ABNORMAL LOW (ref 6.5–8.1)

## 2023-03-03 LAB — C-REACTIVE PROTEIN: CRP: 8.9 mg/dL — ABNORMAL HIGH (ref <1.0)

## 2023-03-03 LAB — BRAIN NATRIURETIC PEPTIDE: B Natriuretic Peptide: 79.3 pg/mL (ref 0.0–100.0)

## 2023-03-03 LAB — FERRITIN: Ferritin: 383 ng/mL — ABNORMAL HIGH (ref 11–307)

## 2023-03-03 LAB — LACTIC ACID, PLASMA
Lactic Acid, Venous: 2 mmol/L (ref 0.5–1.9)
Lactic Acid, Venous: 2.3 mmol/L (ref 0.5–1.9)

## 2023-03-03 LAB — PROCALCITONIN: Procalcitonin: <0.1 ng/mL

## 2023-03-03 MED ORDER — AMIODARONE HCL 200 MG PO TABS
400.0000 mg | ORAL_TABLET | Freq: Two times a day (BID) | ORAL | Status: DC
  Administered 2023-03-03 – 2023-03-09 (×13): 400 mg via ORAL
  Filled 2023-03-03 (×14): qty 2

## 2023-03-03 MED ORDER — APIXABAN 5 MG PO TABS
5.0000 mg | ORAL_TABLET | Freq: Two times a day (BID) | ORAL | Status: DC
  Administered 2023-03-03 – 2023-03-10 (×15): 5 mg via ORAL
  Filled 2023-03-03 (×16): qty 1

## 2023-03-03 MED ORDER — SODIUM CHLORIDE 0.9 % IV SOLN: INTRAVENOUS | Status: DC

## 2023-03-03 MED ORDER — SODIUM CHLORIDE 0.9 % IV BOLUS
1000.0000 mL | Freq: Once | INTRAVENOUS | Status: AC
  Administered 2023-03-03: 1000 mL via INTRAVENOUS

## 2023-03-03 MED ORDER — DILTIAZEM HCL ER COATED BEADS 180 MG PO CP24
360.0000 mg | ORAL_CAPSULE | Freq: Every day | ORAL | Status: DC
  Administered 2023-03-03 – 2023-03-10 (×8): 360 mg via ORAL
  Filled 2023-03-03 (×9): qty 2

## 2023-03-03 NOTE — Progress Notes (Signed)
ANTICOAGULATION CONSULT NOTE  Pharmacy Consult for Heparin>>Lovenox >> apixaban Indication: atrial fibrillation  Allergies  Allergen Reactions   Morphine Nausea And Vomiting    Other reaction(s): Other (See Comments) Unspecified reaction    Other Other (See Comments)    Allergic to muscle relaxers -unknown reaction Flexeril only can take cyclobenzaprine.    Patient Measurements: Height: 5\' 4"  (162.6 cm) Weight: 113.9 kg (251 lb 1.7 oz) IBW/kg (Calculated) : 54.7 Heparin Dosing Weight: 82 kg  Vital Signs: Temp: 98.1 F (36.7 C) (02/21 0712) Temp Source: Oral (02/21 0712) BP: 128/73 (02/21 0330) Pulse Rate: 98 (02/21 0330)  Labs: Recent Labs    03/01/23 1447 03/02/23 0127 03/02/23 0400 03/02/23 0430 03/02/23 1440 03/03/23 0354  HGB 14.0 10.5*  --  11.8*  --  12.5  HCT 40.8 31.0*  --  35.0*  --  35.6*  PLT 146*  --   --  128*  --  141*  APTT 58*  --   --   --   --   --   LABPROT 14.2  --   --   --   --   --   INR 1.1  --   --   --   --   --   HEPARINUNFRC  --   --  0.90*  --  0.83*  --   CREATININE 1.00  --   --  1.18*  --  0.96  CKTOTAL 905*  --   --   --   --   --     Estimated Creatinine Clearance: 64.6 mL/min (by C-G formula based on SCr of 0.96 mg/dL).   Medical History: Past Medical History:  Diagnosis Date   ANEMIA 11/12/2007   Qualifier: Diagnosis of  By: Briscoe Burns CMA, Alvy Beal     Anxiety    Arthritis    ASTHMA NOS W/ACUTE EXACERBATION 01/13/2010   Qualifier: Diagnosis of  By: Yetta Barre MD, Bernadene Bell.    BIPOLAR DISORDER UNSPECIFIED 10/29/2007   Qualifier: Diagnosis of  By: Yetta Barre MD, Bernadene Bell.    COMMON MIGRAINE 12/29/2008   Qualifier: Diagnosis of  By: Yetta Barre MD, Bernadene Bell.    GERD 07/28/2009   Qualifier: Diagnosis of  By: Felicity Coyer MD, Raenette Rover    HYPERLIPIDEMIA 10/29/2007   Qualifier: Diagnosis of  By: Yetta Barre MD, Bernadene Bell.    HYPERTENSION, BENIGN ESSENTIAL 10/29/2007   Qualifier: Diagnosis of  By: Yetta Barre MD, Bernadene Bell.    OSTEOPENIA 10/30/2007    Qualifier: Diagnosis of  By: Yetta Barre MD, Bernadene Bell.    OVERACTIVE BLADDER 02/21/2008   Qualifier: Diagnosis of  By: Jonny Ruiz MD, Len Blalock    SLEEP APNEA 12/31/2009   Qualifier: Diagnosis of  By: Yetta Barre MD, Bernadene Bell.     Medications:  Medications Prior to Admission  Medication Sig Dispense Refill Last Dose/Taking   amLODipine (NORVASC) 2.5 MG tablet Take 1 tablet (2.5 mg total) by mouth daily. 90 tablet 1 Past Week   atorvastatin (LIPITOR) 40 MG tablet Take 1 tablet (40 mg total) by mouth daily. 90 tablet 3 Past Week   cimetidine (TAGAMET) 200 MG tablet Take 200 mg by mouth daily.   Past Week   divalproex (DEPAKOTE ER) 500 MG 24 hr tablet Take 3 tablets (1,500 mg total) by mouth at bedtime. 270 tablet 1 Past Week   doxepin (SINEQUAN) 10 MG capsule Take 1 capsule (10 mg total) by mouth at bedtime. 90 capsule 0 Past Week   famotidine (PEPCID) 20 MG tablet Take  20 mg by mouth daily.   Past Week   furosemide (LASIX) 40 MG tablet Take 1 tablet (40 mg total) by mouth daily. (Patient taking differently: Take 40 mg by mouth 2 (two) times daily.) 90 tablet 1 Past Week   labetalol (NORMODYNE) 200 MG tablet TAKE 1 AND 1/2 TABLETS BY MOUTH IN THE MORNING AND TAKE ONE TABLET IN THE EVENING (Patient taking differently: Take 200-300 mg by mouth See admin instructions. Take 1.5 tablets (300mg ) by mouth every morning and then take 1 tablet (200mg ) by mouth every evening) 225 tablet 1 Past Week   oxybutynin (DITROPAN) 5 MG tablet Take 1 tablet (5 mg total) by mouth 2 (two) times daily. 180 tablet 1 Past Week   pregabalin (LYRICA) 50 MG capsule Take 1 capsule (50 mg total) by mouth 3 (three) times daily. 270 capsule 1 Past Week   promethazine (PHENERGAN) 25 MG tablet TAKE ONE TABLET BY MOUTH EVERY 8 HOURS AS NEEDED FOR NAUSEA AND VOMITING 90 tablet 0 Past Week   benzoyl peroxide 5 % external liquid Apply topically 2 (two) times daily. Please help her find over-the-counter if not covered by the insurance.  If not available  please let us know we can see if the insurance will cover BenzaClin topical. 142 g 12    clindamycin (CLINDAGEL) 1 % gel Apply topically 2 (two) times daily. X 10 days (Patient not taking: Reported on 03/02/2023) 30 g 5 Not Taking   diphenoxylate-atropine (LOMOTIL) 2.5-0.025 MG tablet TAKE ONE TABLET FOUR TIMES DAILY AS NEEDED FOR diarrhea/loose stools (Patient not taking: Reported on 03/02/2023) 30 tablet 2 Not Taking   nitrofurantoin, macrocrystal-monohydrate, (MACROBID) 100 MG capsule Take 1 capsule (100 mg total) by mouth 2 (two) times daily. (Patient not taking: Reported on 03/02/2023) 10 capsule 0 Not Taking   Scheduled:   amiodarone  400 mg Oral BID   apixaban  5 mg Oral BID   dexamethasone (DECADRON) injection  6 mg Intravenous Q24H   diltiazem  360 mg Oral Daily   divalproex  1,500 mg Oral QHS   famotidine  20 mg Oral Daily   pregabalin  50 mg Oral TID   Infusions:   cefTRIAXone (ROCEPHIN)  IV 1 g (03/03/23 0835)   PRN: acetaminophen **OR** acetaminophen, bisacodyl, ondansetron **OR** ondansetron (ZOFRAN) IV  Assessment: 18 yof with a history of HF, HTN, HLD, DM. Patient is presenting with fall. Found to be in AF w/ RVR in the ED. Heparin per pharmacy consult placed for atrial fibrillation.  Patient is not on anticoagulation prior to arrival.  Heparin IV was changed to enoxaparin 110 mg last night (LD 2/20@1703 ). Now able to take oral medications so starting on apixaban. Given age<80, wt>60 kg, and Scr <1.5 qualifies for 5 mg BID. Hgb 12.5, plt 141. No s/sx of bleeding.   Goal of Therapy:  Monitor platelets by anticoagulation protocol: Yes   Plan:  Stop enoxaparin  Start apixaban 5 mg BID - patient aware of deductible  Monitor CBC and for s/sx of bleeding   Thank you for allowing pharmacy to participate in this patient's care,  Sherron Monday, PharmD, BCCCP Clinical Pharmacist  Phone: [phone] 03/03/2023 11:01 AM  Please check AMION for all [facility] phone  numbers After 10:00 PM, call Main Pharmacy [phone]

## 2023-03-03 NOTE — Progress Notes (Addendum)
PROGRESS NOTE    Donna Sexton  WUJ:[id number] DOB: [birth year] DOA: 03/01/2023 PCP: Agapito Games, MD   Brief Narrative:  72/F with obesity, chronic diastolic CHF, hypertension, dyslipidemia, type 2 diabetes mellitus, H/o UTIs was brought to the ED after she was found down by family, last spoke to her ex-husband 24 hours prior, lives alone and is fairly independent.  Unable to recall the events of the last few days, very poor historian this morning. -In the ED she was febrile to 101, A-fib RVR, tachycardic in the 160s, blood pressure 130/87, initially sats were 93% on room air, subsequently placed on 2 to 3 L O2, workup noted sodium 135, creatinine 1.0, CK9 06, hemoglobin 14, she was positive for SARS COVID PCR, UA with many bacteria, greater than 50 WBC  Assessment & Plan:   Principal Problem:   Acute hypoxemic respiratory failure due to COVID-19 [facility]) Active Problems:   GAD (generalized anxiety disorder)   Chronic heart failure with preserved ejection fraction (HCC)   V-tach (HCC)   Atrial fibrillation with rapid ventricular response (HCC)  New onset atrial fibrillation with RVR -In the setting of COVID, initially started on IV amiodarone and IV Cardizem, cardiology following, transition to oral Cardizem and amiodarone and transition to oral Eliquis.  I personally discussed benefits and risk of Eliquis with the patient and her husband who is at the bedside, husband himself is on that medication for 4 years so he was able to understand.  They agreed to continue.   chronic diastolic CHF -Clinically still appears slightly volume overloaded, likely triggered by A-fib RVR, Lasix 40 Mg X1 given yesterday.-Last echo 2021 with preserved EF, ventricular compaction on cardiac MRI.  Repeat echo shows EF 70%.  No wall motion normality.  Checking BNP, if elevated, will repeat another dose of IV Lasix.  Interestingly, chest x-ray upon admission did not show any vascular congestion/edema.    Severe sepsis and acute hypoxic respiratory failure secondary to SARS COVID-19 positive, POA Patient meets criteria for severe sepsis based on tachycardia, tachypnea, COVID-19 infection and acute hypoxia.  Slightly hypoxic secondary to COVID-19, in my opinion.  She is wheezy and has rhonchi bilaterally.  I am concerned about possible bacterial pneumonia as well.  Checking procalcitonin.  She is afebrile and no leukocytosis, if procalcitonin unremarkable, will not initiate antibiotics.  Will check respiratory viral panel as well. -Has elevated ESR and CRP.  Started on dexamethasone overnight in the ED, will continue for now however there is no role of remdesivir.  Addendum: Lactic acid elevated at 2.0.  BNP normal.  Will give her 1 L of IV fluid bolus over 4 hours.  Repeat lactic acid at 8 PM today.  No indication of diuretics.   acute septic encephalopathy -Likely secondary to fever, COVID and sepsis secondary to that.  Fully alert and oriented now. -UA is also abnormal with known history of UTIs, poor historian, unable to clarify symptoms, continue ceftriaxone, follow urine culture. --Increase activity, PT OT eval  AKI: Baseline creatinine appears to be around 0.7-0.8, creatinine jumped to 1.18 which improved to 0.96.   Anxiety, bipolar disorder -Resume Depakote   Type 2 diabetes mellitus -Recent A1c was 6.2, CBGs are stable   Right orbital mass -Incidentally noted on imaging -Will need MRI for this as outpatient  Morbid obesity class III: BMI 43.  Weight loss and dietary modification counseled.  DVT prophylaxis: Eliquis   Code Status: Full Code  Family Communication: Ex-husband present at bedside.  Plan of care discussed with patient in length and he/she verbalized understanding and agreed with it.  Status is: Inpatient Remains inpatient appropriate because: Still appears very weak and fluid overloaded and hypoxic   Estimated body mass index is 43.1 kg/m as calculated from the  following:   Height as of this encounter: 5\' 4"  (1.626 m).   Weight as of this encounter: 113.9 kg.    Nutritional Assessment: Body mass index is 43.1 kg/m.Marland Kitchen Seen by dietician.  I agree with the assessment and plan as outlined below: Nutrition Status:        . Skin Assessment: I have examined the patient's skin and I agree with the wound assessment as performed by the wound care RN as outlined below:    Consultants:  Cardiology  Procedures:  None  Antimicrobials:  Anti-infectives (From admission, onward)    Start     Dose/Rate Route Frequency Ordered Stop   03/02/23 2200  remdesivir 100 mg in sodium chloride 0.9 % 100 mL IVPB  Status:  Discontinued       Placed in "Followed by" Linked Group   100 mg 200 mL/hr over 30 Minutes Intravenous Daily 03/01/23 2146 03/02/23 1013   03/02/23 1015  cefTRIAXone (ROCEPHIN) 1 g in sodium chloride 0.9 % 100 mL IVPB        1 g 200 mL/hr over 30 Minutes Intravenous Every 24 hours 03/02/23 1012     03/01/23 2300  remdesivir 200 mg in sodium chloride 0.9% 250 mL IVPB       Placed in "Followed by" Linked Group   200 mg 580 mL/hr over 30 Minutes Intravenous Once 03/01/23 2146 03/02/23 0157   03/01/23 1530  ceFEPIme (MAXIPIME) 2 g in sodium chloride 0.9 % 100 mL IVPB        2 g 200 mL/hr over 30 Minutes Intravenous  Once 03/01/23 1522 03/01/23 1730   03/01/23 1530  metroNIDAZOLE (FLAGYL) IVPB 500 mg        500 mg 100 mL/hr over 60 Minutes Intravenous  Once 03/01/23 1522 03/01/23 1846   03/01/23 1530  vancomycin (VANCOCIN) IVPB 1000 mg/200 mL premix  Status:  Discontinued        1,000 mg 200 mL/hr over 60 Minutes Intravenous  Once 03/01/23 1522 03/01/23 1524   03/01/23 1530  vancomycin (VANCOREADY) IVPB 2000 mg/400 mL        2,000 mg 200 mL/hr over 120 Minutes Intravenous  Once 03/01/23 1525 03/01/23 2046         Subjective: Patient seen and examined, she is fully alert and oriented.  Despite of requiring oxygen, she denied any  shortness of breath.  No other complaint.  Objective: Vitals:   03/02/23 2000 03/02/23 2330 03/03/23 0330 03/03/23 0712  BP:  133/77 128/73   Pulse: 91 (!) 102 98   Resp:  18 20 18   Temp:  98.2 F (36.8 C) 98.4 F (36.9 C) 98.1 F (36.7 C)  TempSrc:  Oral Oral Oral  SpO2: 95% 93% 95%   Weight:      Height:        Intake/Output Summary (Last 24 hours) at 03/03/2023 1142 Last data filed at 03/03/2023 0920 Gross per 24 hour  Intake 1398.05 ml  Output 300 ml  Net 1098.05 ml   Filed Weights   03/01/23 1952  Weight: 113.9 kg    Examination:  General exam: Appears calm and comfortable, morbidly obese Respiratory system: Coarse breath sounds with rhonchi and wheezes bilaterally. Respiratory effort  normal. Cardiovascular system: S1 & S2 heard, RRR. No JVD, murmurs, rubs, gallops or clicks.  Trace pitting edema bilateral lower extremity Gastrointestinal system: Abdomen is nondistended, soft and nontender. No organomegaly or masses felt. Normal bowel sounds heard. Central nervous system: Alert and oriented. No focal neurological deficits. Extremities: Symmetric 5 x 5 power. Skin: No rashes, lesions or ulcers Psychiatry: Judgement and insight appear normal. Mood & affect appropriate.    Data Reviewed: I have personally reviewed following labs and imaging studies  CBC: Recent Labs  Lab 03/01/23 1447 03/02/23 0127 03/02/23 0430 03/03/23 0354  WBC 10.2  --  8.3 5.8  NEUTROABS 6.3  --   --   --   HGB 14.0 10.5* 11.8* 12.5  HCT 40.8 31.0* 35.0* 35.6*  MCV 97.1  --  97.5 95.2  PLT 146*  --  128* 141*   Basic Metabolic Panel: Recent Labs  Lab 03/01/23 1447 03/02/23 0127 03/02/23 0430 03/03/23 0354  NA 135 134* 134* 134*  K 3.5 3.3* 3.7 4.0  CL 98  --  101 99  CO2 23  --  19* 24  GLUCOSE 131*  --  186* 172*  BUN 18  --  16 25*  CREATININE 1.00  --  1.18* 0.96  CALCIUM 9.1  --  8.2* 8.6*  MG  --   --  1.9  --    GFR: Estimated Creatinine Clearance: 64.6 mL/min (by  C-G formula based on SCr of 0.96 mg/dL). Liver Function Tests: Recent Labs  Lab 03/01/23 1447 03/02/23 0430 03/03/23 0354  AST 69* 60* 45*  ALT 32 25 25  ALKPHOS 47 42 38  BILITOT 2.3* 1.4* 0.6  PROT 5.9* 5.0* 5.6*  ALBUMIN 3.2* 2.6* 2.8*   No results for input(s): "LIPASE", "AMYLASE" in the last 168 hours. No results for input(s): "AMMONIA" in the last 168 hours. Coagulation Profile: Recent Labs  Lab 03/01/23 1447  INR 1.1   Cardiac Enzymes: Recent Labs  Lab 03/01/23 1447  CKTOTAL 905*   BNP (last 3 results) No results for input(s): "PROBNP" in the last 8760 hours. HbA1C: No results for input(s): "HGBA1C" in the last 72 hours. CBG: No results for input(s): "GLUCAP" in the last 168 hours. Lipid Profile: No results for input(s): "CHOL", "HDL", "LDLCALC", "TRIG", "CHOLHDL", "LDLDIRECT" in the last 72 hours. Thyroid Function Tests: Recent Labs    03/02/23 0430  TSH 1.053   Anemia Panel: Recent Labs    03/03/23 0354  FERRITIN 383*   Sepsis Labs: Recent Labs  Lab 03/01/23 1537 03/01/23 1900  LATICACIDVEN 3.0* 1.2    Recent Results (from the past 240 hours)  Blood Culture (routine x 2)     Status: None (Preliminary result)   Collection Time: 03/01/23  4:01 PM   Specimen: BLOOD RIGHT ARM  Result Value Ref Range Status   Specimen Description BLOOD RIGHT ARM  Final   Special Requests   Final    BOTTLES DRAWN AEROBIC ONLY Blood Culture results may not be optimal due to an inadequate volume of blood received in culture bottles   Culture   Final    NO GROWTH 2 DAYS Performed at [facility], [address], [place], [place] [address]    Report Status PENDING  Incomplete  Blood Culture (routine x 2)     Status: None (Preliminary result)   Collection Time: 03/01/23  4:40 PM   Specimen: BLOOD RIGHT ARM  Result Value Ref Range Status   Specimen Description BLOOD  RIGHT ARM  Final   Special Requests   Final    BOTTLES DRAWN AEROBIC AND ANAEROBIC Blood  Culture adequate volume   Culture   Final    NO GROWTH 2 DAYS Performed at [facility], [address], [place], [place] [address]    Report Status PENDING  Incomplete  Resp panel by RT-PCR (RSV, Flu A&B, Covid) Anterior Nasal Swab     Status: Abnormal   Collection Time: 03/01/23  4:48 PM   Specimen: Anterior Nasal Swab  Result Value Ref Range Status   SARS Coronavirus 2 by RT PCR POSITIVE (A) NEGATIVE Final   Influenza A by PCR NEGATIVE NEGATIVE Final   Influenza B by PCR NEGATIVE NEGATIVE Final    Comment: (NOTE) The Xpert Xpress SARS-CoV-2/FLU/RSV plus assay is intended as an aid in the diagnosis of influenza from Nasopharyngeal swab specimens and should not be used as a sole basis for treatment. Nasal washings and aspirates are unacceptable for Xpert Xpress SARS-CoV-2/FLU/RSV testing.  Fact Sheet for Patients: BloggerCourse.com  Fact Sheet for Healthcare Providers: SeriousBroker.it  This test is not yet approved or cleared by the [place] FDA and has been authorized for detection and/or diagnosis of SARS-CoV-2 by FDA under an Emergency Use Authorization (EUA). This EUA will remain in effect (meaning this test can be used) for the duration of the COVID-19 declaration under Section 564(b)(1) of the Act, 21 U.S.C. section 360bbb-3(b)(1), unless the authorization is terminated or revoked.     Resp Syncytial Virus by PCR NEGATIVE NEGATIVE Final    Comment: (NOTE) Fact Sheet for Patients: BloggerCourse.com  Fact Sheet for Healthcare Providers: SeriousBroker.it  This test is not yet approved or cleared by the [place] FDA and has been authorized for detection and/or diagnosis of SARS-CoV-2 by FDA under an Emergency Use Authorization (EUA). This EUA will remain in effect (meaning this test can be used) for the duration of the COVID-19 declaration under Section  564(b)(1) of the Act, 21 U.S.C. section 360bbb-3(b)(1), unless the authorization is terminated or revoked.  Performed at [facility], [address] [place], [place] [address]      Radiology Studies: ECHOCARDIOGRAM COMPLETE Result Date: 03/02/2023    ECHOCARDIOGRAM REPORT   Patient Name:   FATE GALANTI Date of Exam: 03/02/2023 Medical Rec #:  [id number]       Height:       64.0 in Accession #:    [id number]      Weight:       251.1 lb Date of Birth:  1950-02-28       BSA:          2.155 m Patient Age:    72 years        BP:           126/88 mmHg Patient Gender: F               HR:           101 bpm. Exam Location:  Inpatient Procedure: 2D Echo, Cardiac Doppler and Color Doppler (Both Spectral and Color            Flow Doppler were utilized during procedure). Indications:    atrial fibrillation  History:        Patient has no prior history of Echocardiogram examinations.                 CHF; Risk Factors:Hypertension.  Sonographer:    Amy Chionchio Referring Phys:  [id number] PETER M [place]  Sonographer Comments: Image acquisition challenging due to patient body habitus. IMPRESSIONS  1. Left ventricular ejection fraction, by estimation, is 70 to 75%. The left ventricle has hyperdynamic function. Left ventricular endocardial border not optimally defined to evaluate regional wall motion. Left ventricular diastolic parameters are indeterminate.  2. Right ventricular systolic function is normal. The right ventricular size is normal. Tricuspid regurgitation signal is inadequate for assessing PA pressure.  3. The mitral valve was not well visualized. No evidence of mitral valve regurgitation. No evidence of mitral stenosis.  4. The aortic valve was not well visualized. Aortic valve regurgitation is not visualized. No aortic stenosis is present.  5. The inferior vena cava is normal in size with greater than 50% respiratory variability, suggesting right atrial pressure of 3 mmHg. FINDINGS  Left Ventricle: Left  ventricular ejection fraction, by estimation, is 70 to 75%. The left ventricle has hyperdynamic function. Left ventricular endocardial border not optimally defined to evaluate regional wall motion. The left ventricular internal cavity size was normal in size. There is borderline left ventricular hypertrophy. Left ventricular diastolic parameters are indeterminate. Right Ventricle: The right ventricular size is normal. Right vetricular wall thickness was not well visualized. Right ventricular systolic function is normal. Tricuspid regurgitation signal is inadequate for assessing PA pressure. Left Atrium: Left atrial size was normal in size. Right Atrium: Right atrial size was not well visualized. Pericardium: There is no evidence of pericardial effusion. Mitral Valve: The mitral valve was not well visualized. No evidence of mitral valve regurgitation. No evidence of mitral valve stenosis. MV peak gradient, 2.8 mmHg. The mean mitral valve gradient is 1.0 mmHg. Tricuspid Valve: The tricuspid valve is normal in structure. Tricuspid valve regurgitation is trivial. No evidence of tricuspid stenosis. Aortic Valve: The aortic valve was not well visualized. Aortic valve regurgitation is not visualized. No aortic stenosis is present. Aortic valve mean gradient measures 4.0 mmHg. Aortic valve peak gradient measures 7.6 mmHg. Pulmonic Valve: The pulmonic valve was normal in structure. Pulmonic valve regurgitation is trivial. No evidence of pulmonic stenosis. Aorta: The aortic root is normal in size and structure. Venous: The inferior vena cava is normal in size with greater than 50% respiratory variability, suggesting right atrial pressure of 3 mmHg. IAS/Shunts: The interatrial septum was not well visualized.   LV Volumes (MOD) LV vol d, MOD A2C: 33.6 ml LV vol d, MOD A4C: 43.4 ml LV vol s, MOD A2C: 11.4 ml LV vol s, MOD A4C: 12.2 ml LV SV MOD A2C:     22.2 ml LV SV MOD A4C:     43.4 ml LV SV MOD BP:      26.8 ml RIGHT  VENTRICLE          IVC RV Basal diam:  4.50 cm  IVC diam: 1.60 cm RV Mid diam:    3.70 cm TAPSE (M-mode): 2.3 cm LEFT ATRIUM           Index LA Vol (A2C): 21.6 ml 10.02 ml/m  AORTIC VALVE AV Vmax:           138.00 cm/s AV Vmean:          91.100 cm/s AV VTI:            0.153 m AV Peak Grad:      7.6 mmHg AV Mean Grad:      4.0 mmHg LVOT Vmax:         86.20 cm/s LVOT Vmean:  46.200 cm/s LVOT VTI:          0.094 m LVOT/AV VTI ratio: 0.61 MITRAL VALVE MV Peak grad: 2.8 mmHg  SHUNTS MV Mean grad: 1.0 mmHg  Systemic VTI: 0.09 m MV Vmax:      0.83 m/s MV Vmean:     51.8 cm/s Weston Brass MD Electronically signed by Weston Brass MD Signature Date/Time: 03/02/2023/4:28:15 PM    Final    CT Head Wo Contrast Result Date: 03/01/2023 CLINICAL DATA:  Fever generalized weakness found down EXAM: CT HEAD WITHOUT CONTRAST CT CERVICAL SPINE WITHOUT CONTRAST TECHNIQUE: Multidetector CT imaging of the head and cervical spine was performed following the standard protocol without intravenous contrast. Multiplanar CT image reconstructions of the cervical spine were also generated. RADIATION DOSE REDUCTION: This exam was performed according to the departmental dose-optimization program which includes automated exposure control, adjustment of the mA and/or kV according to patient size and/or use of iterative reconstruction technique. COMPARISON:  CT brain 02/01/2010 FINDINGS: CT HEAD FINDINGS Brain: Significant motion degradation limits the exam. No hemorrhage is visualized. Atrophy and mild chronic small vessel ischemic changes of the white matter. Nonenlarged ventricles. Vascular: No hyperdense vessels.  No unexpected calcification Skull: No gross fracture Sinuses/Orbits: Postsurgical changes of the anterior maxillary sinus walls. Patchy mucosal thickening in the ethmoid sinuses. Fluid level left maxillary sinus. Right inferomedial slightly dense right orbital mass measuring 1.9 x 1.3 cm. Other: None CT CERVICAL SPINE  FINDINGS Alignment: Significant motion degradation. Reversal of cervical lordosis. Trace anterolisthesis C2 on C3, C3 on C4 and C4 on C5. Facet alignment grossly within normal limits Skull base and vertebrae: No obvious fracture. Soft tissues and spinal canal: Indistinct prevertebral soft tissues, largely degraded by motion Disc levels: Moderate severe disc space narrowing C4-C5, C5-C6 and C6-C7. Multilevel facet degenerative changes with foraminal narrowing. Upper chest: Lung apices are clear Other: None IMPRESSION: 1. Significant motion degradation limits the exam. No acute intracranial abnormality is seen. Atrophy and chronic small vessel ischemic changes of the white matter. 2. Reversal of cervical lordosis with trace anterolisthesis C2 on C3, C3 on C4 and C4 on C5, likely degenerative. No definitive fracture is seen. Indistinct prevertebral soft tissues, largely degraded by motion but difficult to exclude prevertebral edema. Repeat CT when patient able to remain still, versus MRI attempt could be considered if high clinical suspicion for acute cervical injury. 3. 1.9 cm right inferomedial orbital mass, recommend further evaluation with nonemergent orbital MRI with and without contrast. 4. Fluid level in the left maxillary sinus, correlate for acute sinusitis. Electronically Signed   By: Jasmine Pang M.D.   On: 03/01/2023 19:17   CT Cervical Spine Wo Contrast Result Date: 03/01/2023 CLINICAL DATA:  Fever generalized weakness found down EXAM: CT HEAD WITHOUT CONTRAST CT CERVICAL SPINE WITHOUT CONTRAST TECHNIQUE: Multidetector CT imaging of the head and cervical spine was performed following the standard protocol without intravenous contrast. Multiplanar CT image reconstructions of the cervical spine were also generated. RADIATION DOSE REDUCTION: This exam was performed according to the departmental dose-optimization program which includes automated exposure control, adjustment of the mA and/or kV according to  patient size and/or use of iterative reconstruction technique. COMPARISON:  CT brain 02/01/2010 FINDINGS: CT HEAD FINDINGS Brain: Significant motion degradation limits the exam. No hemorrhage is visualized. Atrophy and mild chronic small vessel ischemic changes of the white matter. Nonenlarged ventricles. Vascular: No hyperdense vessels.  No unexpected calcification Skull: No gross fracture Sinuses/Orbits: Postsurgical changes of the anterior maxillary sinus walls.  Patchy mucosal thickening in the ethmoid sinuses. Fluid level left maxillary sinus. Right inferomedial slightly dense right orbital mass measuring 1.9 x 1.3 cm. Other: None CT CERVICAL SPINE FINDINGS Alignment: Significant motion degradation. Reversal of cervical lordosis. Trace anterolisthesis C2 on C3, C3 on C4 and C4 on C5. Facet alignment grossly within normal limits Skull base and vertebrae: No obvious fracture. Soft tissues and spinal canal: Indistinct prevertebral soft tissues, largely degraded by motion Disc levels: Moderate severe disc space narrowing C4-C5, C5-C6 and C6-C7. Multilevel facet degenerative changes with foraminal narrowing. Upper chest: Lung apices are clear Other: None IMPRESSION: 1. Significant motion degradation limits the exam. No acute intracranial abnormality is seen. Atrophy and chronic small vessel ischemic changes of the white matter. 2. Reversal of cervical lordosis with trace anterolisthesis C2 on C3, C3 on C4 and C4 on C5, likely degenerative. No definitive fracture is seen. Indistinct prevertebral soft tissues, largely degraded by motion but difficult to exclude prevertebral edema. Repeat CT when patient able to remain still, versus MRI attempt could be considered if high clinical suspicion for acute cervical injury. 3. 1.9 cm right inferomedial orbital mass, recommend further evaluation with nonemergent orbital MRI with and without contrast. 4. Fluid level in the left maxillary sinus, correlate for acute sinusitis.  Electronically Signed   By: Jasmine Pang M.D.   On: 03/01/2023 19:17   DG Chest Port 1 View Result Date: 03/01/2023 CLINICAL DATA:  Question or sepsis. EXAM: PORTABLE CHEST 1 VIEW COMPARISON:  Chest radiograph dated 06/06/2011 FINDINGS: The shallow inspiration with minimal bibasilar atelectasis. No consolidative changes, pleural effusion, pneumothorax. The cardiac silhouette is within normal limits. No acute osseous pathology. IMPRESSION: Minimal bibasilar atelectasis. No focal consolidation. Electronically Signed   By: Elgie Collard M.D.   On: 03/01/2023 15:45    Scheduled Meds:  amiodarone  400 mg Oral BID   apixaban  5 mg Oral BID   dexamethasone (DECADRON) injection  6 mg Intravenous Q24H   diltiazem  360 mg Oral Daily   divalproex  1,500 mg Oral QHS   famotidine  20 mg Oral Daily   pregabalin  50 mg Oral TID   Continuous Infusions:  cefTRIAXone (ROCEPHIN)  IV 1 g (03/03/23 0835)     LOS: 2 days   Hughie Closs, MD [facility]  03/03/2023, 11:42 AM   *Please note that this is a verbal dictation therefore any spelling or grammatical errors are due to the "Dragon Medical One" system interpretation.  Please page via Amion and do not message via secure chat for urgent patient care matters. Secure chat can be used for non urgent patient care matters.  How to contact the [facility] Attending or Consulting provider 7A - 7P or covering provider during after hours 7P -7A, for this patient?  Check the care team in [facility] and look for a) attending/consulting [facility] provider listed and b) the [facility] team listed. Page or secure chat 7A-7P. Log into [email or web address] and use [place]'s universal password to access. If you do not have the password, please contact the hospital operator. Locate the [facility] provider you are looking for under [facility] and page to a number that you can be directly reached. If you still have difficulty reaching the provider, please page the [facility] (Director on Call) for  the Hospitalists listed on amion for assistance.

## 2023-03-03 NOTE — Plan of Care (Signed)
  Problem: Coping: Goal: Psychosocial and spiritual needs will be supported Outcome: Progressing   Problem: Respiratory: Goal: Will maintain a patent airway Outcome: Progressing   Problem: Clinical Measurements: Goal: Respiratory complications will improve Outcome: Progressing Goal: Cardiovascular complication will be avoided Outcome: Progressing   Problem: Nutrition: Goal: Adequate nutrition will be maintained Outcome: Progressing   Problem: Safety: Goal: Ability to remain free from injury will improve Outcome: Progressing   Problem: Cardiac: Goal: Ability to achieve and maintain adequate cardiopulmonary perfusion will improve Outcome: Progressing

## 2023-03-03 NOTE — Telephone Encounter (Signed)
 Copied from CRM [id number]. Topic: General - Other >> Mar 01, 2023  2:07 PM Nila Nephew wrote: Reason for CRM: Patient's ex-spouse is calling to notify PCP that this patient was discovered unresponsive in her home today. Patient's ex-spouse felt the need to notify PCP, as PCP is the only remaining contact she has 'in this world' aside from him.   Patient would like a call back when PCP knows what is going on a little bit more. Patient's ex-spouse would like PCP involved to ensure utmost care for this patient.

## 2023-03-03 NOTE — Progress Notes (Signed)
Rounding Note    Patient Name: Donna Sexton Date of Encounter: 03/03/2023  [facility] Cardiologist: None new  Subjective   Patient awake and now fully oriented.  Denies SOB or chest pain  Inpatient Medications    Scheduled Meds:  dexamethasone (DECADRON) injection  6 mg Intravenous Q24H   divalproex  1,500 mg Oral QHS   enoxaparin (LOVENOX) injection  110 mg Subcutaneous BID   famotidine  20 mg Oral Daily   pregabalin  50 mg Oral TID   Continuous Infusions:  amiodarone 30 mg/hr (03/03/23 0423)   cefTRIAXone (ROCEPHIN)  IV 1 g (03/03/23 0835)   diltiazem (CARDIZEM) infusion 15 mg/hr (03/03/23 0446)   PRN Meds: acetaminophen **OR** acetaminophen, bisacodyl, ondansetron **OR** ondansetron (ZOFRAN) IV   Vital Signs    Vitals:   03/02/23 2000 03/02/23 2330 03/03/23 0330 03/03/23 0712  BP:  133/77 128/73   Pulse: 91 (!) 102 98   Resp:  18 20 18   Temp:  98.2 F (36.8 C) 98.4 F (36.9 C) 98.1 F (36.7 C)  TempSrc:  Oral Oral Oral  SpO2: 95% 93% 95%   Weight:      Height:        Intake/Output Summary (Last 24 hours) at 03/03/2023 1016 Last data filed at 03/03/2023 0920 Gross per 24 hour  Intake 1398.05 ml  Output 300 ml  Net 1098.05 ml      03/01/2023    7:52 PM 02/16/2023   10:04 AM 11/16/2022    8:39 AM  Last 3 Weights  Weight (lbs) 251 lb 1.7 oz 251 lb 250 lb  Weight (kg) 113.9 kg 113.853 kg 113.399 kg      Telemetry    Afib with controlled rate - Personally Reviewed  ECG    None today - Personally Reviewed  Physical Exam   GEN: elderly, obese Neck: No JVD Cardiac: IRRR, no murmurs, rubs, or gallops.  Respiratory: clear GI: Soft, nontender, non-distended  MS: No pitting  edema; No deformity. Neuro:  nonfocal, fully oriented Psych: Normal affect   Labs    High Sensitivity Troponin:  No results for input(s): "TROPONINIHS" in the last 720 hours.   Chemistry Recent Labs  Lab 03/01/23 1447 03/02/23 0127 03/02/23 0430  03/03/23 0354  NA 135 134* 134* 134*  K 3.5 3.3* 3.7 4.0  CL 98  --  101 99  CO2 23  --  19* 24  GLUCOSE 131*  --  186* 172*  BUN 18  --  16 25*  CREATININE 1.00  --  1.18* 0.96  CALCIUM 9.1  --  8.2* 8.6*  MG  --   --  1.9  --   PROT 5.9*  --  5.0* 5.6*  ALBUMIN 3.2*  --  2.6* 2.8*  AST 69*  --  60* 45*  ALT 32  --  25 25  ALKPHOS 47  --  42 38  BILITOT 2.3*  --  1.4* 0.6  GFRNONAA 59*  --  49* >60  ANIONGAP 14  --  14 11    Lipids No results for input(s): "CHOL", "TRIG", "HDL", "LABVLDL", "LDLCALC", "CHOLHDL" in the last 168 hours.  Hematology Recent Labs  Lab 03/01/23 1447 03/02/23 0127 03/02/23 0430 03/03/23 0354  WBC 10.2  --  8.3 5.8  RBC 4.20  --  3.59* 3.74*  HGB 14.0 10.5* 11.8* 12.5  HCT 40.8 31.0* 35.0* 35.6*  MCV 97.1  --  97.5 95.2  MCH 33.3  --  32.9 33.4  MCHC 34.3  --  33.7 35.1  RDW 12.7  --  13.1 13.0  PLT 146*  --  128* 141*   Thyroid  Recent Labs  Lab 03/02/23 0430  TSH 1.053    BNPNo results for input(s): "BNP", "PROBNP" in the last 168 hours.  DDimer No results for input(s): "DDIMER" in the last 168 hours.   Radiology    ECHOCARDIOGRAM COMPLETE Result Date: 03/02/2023    ECHOCARDIOGRAM REPORT   Patient Name:   Donna Sexton Date of Exam: 03/02/2023 Medical Rec #:  [id number]       Height:       64.0 in Accession #:    [id number]      Weight:       251.1 lb Date of Birth:  04-15-1950       BSA:          2.155 m Patient Age:    72 years        BP:           126/88 mmHg Patient Gender: F               HR:           101 bpm. Exam Location:  Inpatient Procedure: 2D Echo, Cardiac Doppler and Color Doppler (Both Spectral and Color            Flow Doppler were utilized during procedure). Indications:    atrial fibrillation  History:        Patient has no prior history of Echocardiogram examinations.                 CHF; Risk Factors:Hypertension.  Sonographer:    Amy Chionchio Referring Phys: [id number] Kirstin Kugler M [place]  Sonographer Comments: Image acquisition  challenging due to patient body habitus. IMPRESSIONS  1. Left ventricular ejection fraction, by estimation, is 70 to 75%. The left ventricle has hyperdynamic function. Left ventricular endocardial border not optimally defined to evaluate regional wall motion. Left ventricular diastolic parameters are indeterminate.  2. Right ventricular systolic function is normal. The right ventricular size is normal. Tricuspid regurgitation signal is inadequate for assessing PA pressure.  3. The mitral valve was not well visualized. No evidence of mitral valve regurgitation. No evidence of mitral stenosis.  4. The aortic valve was not well visualized. Aortic valve regurgitation is not visualized. No aortic stenosis is present.  5. The inferior vena cava is normal in size with greater than 50% respiratory variability, suggesting right atrial pressure of 3 mmHg. FINDINGS  Left Ventricle: Left ventricular ejection fraction, by estimation, is 70 to 75%. The left ventricle has hyperdynamic function. Left ventricular endocardial border not optimally defined to evaluate regional wall motion. The left ventricular internal cavity size was normal in size. There is borderline left ventricular hypertrophy. Left ventricular diastolic parameters are indeterminate. Right Ventricle: The right ventricular size is normal. Right vetricular wall thickness was not well visualized. Right ventricular systolic function is normal. Tricuspid regurgitation signal is inadequate for assessing PA pressure. Left Atrium: Left atrial size was normal in size. Right Atrium: Right atrial size was not well visualized. Pericardium: There is no evidence of pericardial effusion. Mitral Valve: The mitral valve was not well visualized. No evidence of mitral valve regurgitation. No evidence of mitral valve stenosis. MV peak gradient, 2.8 mmHg. The mean mitral valve gradient is 1.0 mmHg. Tricuspid Valve: The tricuspid valve is normal in structure. Tricuspid valve  regurgitation is trivial. No evidence of  tricuspid stenosis. Aortic Valve: The aortic valve was not well visualized. Aortic valve regurgitation is not visualized. No aortic stenosis is present. Aortic valve mean gradient measures 4.0 mmHg. Aortic valve peak gradient measures 7.6 mmHg. Pulmonic Valve: The pulmonic valve was normal in structure. Pulmonic valve regurgitation is trivial. No evidence of pulmonic stenosis. Aorta: The aortic root is normal in size and structure. Venous: The inferior vena cava is normal in size with greater than 50% respiratory variability, suggesting right atrial pressure of 3 mmHg. IAS/Shunts: The interatrial septum was not well visualized.   LV Volumes (MOD) LV vol d, MOD A2C: 33.6 ml LV vol d, MOD A4C: 43.4 ml LV vol s, MOD A2C: 11.4 ml LV vol s, MOD A4C: 12.2 ml LV SV MOD A2C:     22.2 ml LV SV MOD A4C:     43.4 ml LV SV MOD BP:      26.8 ml RIGHT VENTRICLE          IVC RV Basal diam:  4.50 cm  IVC diam: 1.60 cm RV Mid diam:    3.70 cm TAPSE (M-mode): 2.3 cm LEFT ATRIUM           Index LA Vol (A2C): 21.6 ml 10.02 ml/m  AORTIC VALVE AV Vmax:           138.00 cm/s AV Vmean:          91.100 cm/s AV VTI:            0.153 m AV Peak Grad:      7.6 mmHg AV Mean Grad:      4.0 mmHg LVOT Vmax:         86.20 cm/s LVOT Vmean:        46.200 cm/s LVOT VTI:          0.094 m LVOT/AV VTI ratio: 0.61 MITRAL VALVE MV Peak grad: 2.8 mmHg  SHUNTS MV Mean grad: 1.0 mmHg  Systemic VTI: 0.09 m MV Vmax:      0.83 m/s MV Vmean:     51.8 cm/s Weston Brass MD Electronically signed by Weston Brass MD Signature Date/Time: 03/02/2023/4:28:15 PM    Final    CT Head Wo Contrast Result Date: 03/01/2023 CLINICAL DATA:  Fever generalized weakness found down EXAM: CT HEAD WITHOUT CONTRAST CT CERVICAL SPINE WITHOUT CONTRAST TECHNIQUE: Multidetector CT imaging of the head and cervical spine was performed following the standard protocol without intravenous contrast. Multiplanar CT image reconstructions of the  cervical spine were also generated. RADIATION DOSE REDUCTION: This exam was performed according to the departmental dose-optimization program which includes automated exposure control, adjustment of the mA and/or kV according to patient size and/or use of iterative reconstruction technique. COMPARISON:  CT brain 02/01/2010 FINDINGS: CT HEAD FINDINGS Brain: Significant motion degradation limits the exam. No hemorrhage is visualized. Atrophy and mild chronic small vessel ischemic changes of the white matter. Nonenlarged ventricles. Vascular: No hyperdense vessels.  No unexpected calcification Skull: No gross fracture Sinuses/Orbits: Postsurgical changes of the anterior maxillary sinus walls. Patchy mucosal thickening in the ethmoid sinuses. Fluid level left maxillary sinus. Right inferomedial slightly dense right orbital mass measuring 1.9 x 1.3 cm. Other: None CT CERVICAL SPINE FINDINGS Alignment: Significant motion degradation. Reversal of cervical lordosis. Trace anterolisthesis C2 on C3, C3 on C4 and C4 on C5. Facet alignment grossly within normal limits Skull base and vertebrae: No obvious fracture. Soft tissues and spinal canal: Indistinct prevertebral soft tissues, largely degraded by motion Disc levels: Moderate severe  disc space narrowing C4-C5, C5-C6 and C6-C7. Multilevel facet degenerative changes with foraminal narrowing. Upper chest: Lung apices are clear Other: None IMPRESSION: 1. Significant motion degradation limits the exam. No acute intracranial abnormality is seen. Atrophy and chronic small vessel ischemic changes of the white matter. 2. Reversal of cervical lordosis with trace anterolisthesis C2 on C3, C3 on C4 and C4 on C5, likely degenerative. No definitive fracture is seen. Indistinct prevertebral soft tissues, largely degraded by motion but difficult to exclude prevertebral edema. Repeat CT when patient able to remain still, versus MRI attempt could be considered if high clinical suspicion for  acute cervical injury. 3. 1.9 cm right inferomedial orbital mass, recommend further evaluation with nonemergent orbital MRI with and without contrast. 4. Fluid level in the left maxillary sinus, correlate for acute sinusitis. Electronically Signed   By: Jasmine Pang M.D.   On: 03/01/2023 19:17   CT Cervical Spine Wo Contrast Result Date: 03/01/2023 CLINICAL DATA:  Fever generalized weakness found down EXAM: CT HEAD WITHOUT CONTRAST CT CERVICAL SPINE WITHOUT CONTRAST TECHNIQUE: Multidetector CT imaging of the head and cervical spine was performed following the standard protocol without intravenous contrast. Multiplanar CT image reconstructions of the cervical spine were also generated. RADIATION DOSE REDUCTION: This exam was performed according to the departmental dose-optimization program which includes automated exposure control, adjustment of the mA and/or kV according to patient size and/or use of iterative reconstruction technique. COMPARISON:  CT brain 02/01/2010 FINDINGS: CT HEAD FINDINGS Brain: Significant motion degradation limits the exam. No hemorrhage is visualized. Atrophy and mild chronic small vessel ischemic changes of the white matter. Nonenlarged ventricles. Vascular: No hyperdense vessels.  No unexpected calcification Skull: No gross fracture Sinuses/Orbits: Postsurgical changes of the anterior maxillary sinus walls. Patchy mucosal thickening in the ethmoid sinuses. Fluid level left maxillary sinus. Right inferomedial slightly dense right orbital mass measuring 1.9 x 1.3 cm. Other: None CT CERVICAL SPINE FINDINGS Alignment: Significant motion degradation. Reversal of cervical lordosis. Trace anterolisthesis C2 on C3, C3 on C4 and C4 on C5. Facet alignment grossly within normal limits Skull base and vertebrae: No obvious fracture. Soft tissues and spinal canal: Indistinct prevertebral soft tissues, largely degraded by motion Disc levels: Moderate severe disc space narrowing C4-C5, C5-C6 and  C6-C7. Multilevel facet degenerative changes with foraminal narrowing. Upper chest: Lung apices are clear Other: None IMPRESSION: 1. Significant motion degradation limits the exam. No acute intracranial abnormality is seen. Atrophy and chronic small vessel ischemic changes of the white matter. 2. Reversal of cervical lordosis with trace anterolisthesis C2 on C3, C3 on C4 and C4 on C5, likely degenerative. No definitive fracture is seen. Indistinct prevertebral soft tissues, largely degraded by motion but difficult to exclude prevertebral edema. Repeat CT when patient able to remain still, versus MRI attempt could be considered if high clinical suspicion for acute cervical injury. 3. 1.9 cm right inferomedial orbital mass, recommend further evaluation with nonemergent orbital MRI with and without contrast. 4. Fluid level in the left maxillary sinus, correlate for acute sinusitis. Electronically Signed   By: Jasmine Pang M.D.   On: 03/01/2023 19:17   DG Chest Port 1 View Result Date: 03/01/2023 CLINICAL DATA:  Question or sepsis. EXAM: PORTABLE CHEST 1 VIEW COMPARISON:  Chest radiograph dated 06/06/2011 FINDINGS: The shallow inspiration with minimal bibasilar atelectasis. No consolidative changes, pleural effusion, pneumothorax. The cardiac silhouette is within normal limits. No acute osseous pathology. IMPRESSION: Minimal bibasilar atelectasis. No focal consolidation. Electronically Signed   By: Elgie Collard  M.D.   On: 03/01/2023 15:45    Cardiac Studies   Echo 2021: SUMMARY  The left ventricular size is normal.  There is normal left ventricular wall thickness.  Left ventricular systolic function is normal.  LV ejection fraction = 55-60%.  No segmental wall motion abnormalities seen in the left ventricle  The right ventricle is normal size.  The right ventricular systolic function is normal.  The left atrium is moderately dilated.  No significant valvular stenosis or regurgitation.  IVC size  was normal.  There is no pericardial effusion.  Probably no significant change from prior study.      Nuclear stress test 02/2020: 1.  No inducible transmural ischemia or transmural scar.  2.  No wall motion abnormalities or transient ischemic dilation.  3.  Left ventricular ejection fraction measures approximately 58% during stress.     Patient Profile     73 y.o. 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 Rhae Hammock.   Assessment & Plan    Atrial fibrillation with RVR -Appears to be a new diagnosis this admission in the setting of sepsis secondary to COVID infection - on IV amiodarone  - on IV Cardizem now at 10 mg/hr - rate control improved - echo is normal. - anticoagulated with IV heparin for now.  - will transition amiodarone and diltiazem to po - begin DOAC and stop IV heparin   Sepsis secondary to COVID infection +/- UTI - sepsis protocol initiated - elevated lactic acid 3.0 and CK 905. Lactate improved to 1.2 - on steroids and Remdesivir per primary team   Recent UTI - treated by PCP - bactrim 02/16/23 - repeat cultures pending - UA with many bacteria and mod leucocytes.   Chronic diastolic heart failure - maintained on 40 mg lasix at home - holding this for now - suspect she is volume depleted with prolonged down time and rhabdo   Hypertension BP is normal         For questions or updates, please contact [facility] Please consult [email or web address] for contact info under        Signed, Klover Priestly [place], MD  03/03/2023, 10:16 AM

## 2023-03-03 NOTE — Discharge Instructions (Signed)

## 2023-03-04 ENCOUNTER — Inpatient Hospital Stay (HOSPITAL_COMMUNITY): Payer: Medicare Other

## 2023-03-04 DIAGNOSIS — I5032 Chronic diastolic (congestive) heart failure: Secondary | ICD-10-CM | POA: Diagnosis not present

## 2023-03-04 DIAGNOSIS — J9601 Acute respiratory failure with hypoxia: Secondary | ICD-10-CM | POA: Diagnosis not present

## 2023-03-04 DIAGNOSIS — I4891 Unspecified atrial fibrillation: Secondary | ICD-10-CM | POA: Diagnosis not present

## 2023-03-04 DIAGNOSIS — U071 COVID-19: Secondary | ICD-10-CM | POA: Diagnosis not present

## 2023-03-04 LAB — BASIC METABOLIC PANEL
Anion gap: 15 (ref 5–15)
BUN: 22 mg/dL (ref 8–23)
CO2: 22 mmol/L (ref 22–32)
Calcium: 9.4 mg/dL (ref 8.9–10.3)
Chloride: 97 mmol/L — ABNORMAL LOW (ref 98–111)
Creatinine, Ser: 0.73 mg/dL (ref 0.44–1.00)
GFR, Estimated: >60 mL/min (ref >60)
Glucose, Bld: 177 mg/dL — ABNORMAL HIGH (ref 70–99)
Potassium: 4.2 mmol/L (ref 3.5–5.1)
Sodium: 134 mmol/L — ABNORMAL LOW (ref 135–145)

## 2023-03-04 LAB — LACTIC ACID, PLASMA
Lactic Acid, Venous: 2.2 mmol/L (ref 0.5–1.9)
Lactic Acid, Venous: 2.6 mmol/L (ref 0.5–1.9)

## 2023-03-04 LAB — FERRITIN: Ferritin: 330 ng/mL — ABNORMAL HIGH (ref 11–307)

## 2023-03-04 LAB — BRAIN NATRIURETIC PEPTIDE: B Natriuretic Peptide: 104.4 pg/mL — ABNORMAL HIGH (ref 0.0–100.0)

## 2023-03-04 LAB — C-REACTIVE PROTEIN: CRP: 2.6 mg/dL — ABNORMAL HIGH (ref <1.0)

## 2023-03-04 MED ORDER — SODIUM CHLORIDE 0.9 % IV BOLUS
1000.0000 mL | Freq: Once | INTRAVENOUS | Status: AC
  Administered 2023-03-04: 1000 mL via INTRAVENOUS

## 2023-03-04 NOTE — Progress Notes (Signed)
 PROGRESS NOTE    Donna Sexton  MVH:[id number] DOB: 07-20-1950 DOA: 03/01/2023 PCP: Agapito Games, MD   Brief Narrative:  72/F with obesity, chronic diastolic CHF, hypertension, dyslipidemia, type 2 diabetes mellitus, H/o UTIs was brought to the ED after she was found down by family, last spoke to her ex-husband 24 hours prior, lives alone and is fairly independent.  Unable to recall the events of the last few days, very poor historian this morning. -In the ED she was febrile to 101, A-fib RVR, tachycardic in the 160s, blood pressure 130/87, initially sats were 93% on room air, subsequently placed on 2 to 3 L O2, workup noted sodium 135, creatinine 1.0, CK9 06, hemoglobin 14, she was positive for SARS COVID PCR, UA with many bacteria, greater than 50 WBC  Assessment & Plan:   Principal Problem:   Acute hypoxemic respiratory failure due to COVID-19 [facility]) Active Problems:   GAD (generalized anxiety disorder)   Chronic heart failure with preserved ejection fraction (HCC)   V-tach (HCC)   Atrial fibrillation with rapid ventricular response (HCC)  New onset atrial fibrillation with RVR -In the setting of COVID, initially started on IV amiodarone and IV Cardizem, cardiology following, transitioned to oral Cardizem and amiodarone and transition to oral Eliquis.  I personally discussed benefits and risk of Eliquis with the patient and her husband who is at the bedside, husband himself is on that medication for 4 years so he was able to understand.  They agreed to continue.   chronic diastolic CHF -Clinically still appears slightly volume overloaded, likely triggered by A-fib RVR, Lasix 40 Mg X1 given yesterday.-Last echo 2021 with preserved EF, ventricular compaction on cardiac MRI.  Repeat echo shows EF 70%.  No wall motion normality.  No overt heart failure symptoms currently and clinically does not appear to be having any heart failure either, although she does have +2 pitting edema.   Chest x-ray with no vascular congestion/edema and no crackles on examination either.  Due to lactic acidosis, giving her another fluid bolus.   Severe sepsis and acute hypoxic respiratory failure secondary to SARS COVID-19 positive, POA Patient meets criteria for severe sepsis based on tachycardia, tachypnea, COVID-19 infection and acute hypoxia.  Slightly hypoxic secondary to COVID-19 plus atelectasis, in my opinion.  She has rhonchi and mild expiratory wheezes, slightly improved compared to yesterday.  Procalcitonin unremarkable, no concern for bacterial pneumonia.  Respiratory viral panel negative otherwise.  ESR and CRP and ferritin improving.  Will continue dexamethasone but no role of remdesivir in her situation.  Encouraged incentive spirometry.  Requested RN to provide incentive spirometry.  Repeat lactic acid later today after fluid bolus is completed.   acute septic encephalopathy -Likely secondary to fever, COVID and sepsis secondary to that.  Fully alert and oriented now. -UA is also abnormal with known history of UTIs, poor historian, unable to clarify symptoms, continue ceftriaxone, follow urine culture. --Increase activity, PT OT eval  AKI: Baseline creatinine appears to be around 0.7-0.8, creatinine jumped to 1.18 which improved to 0.96.   Anxiety, bipolar disorder -Resume Depakote   Type 2 diabetes mellitus -Recent A1c was 6.2, CBGs are stable   Right orbital mass -Incidentally noted on imaging -Will need MRI for this as outpatient  Morbid obesity class III: BMI 43.  Weight loss and dietary modification counseled.  DVT prophylaxis: Eliquis   Code Status: Full Code  Family Communication: Ex-husband present at bedside.  Plan of care discussed with patient in length  and he/she verbalized understanding and agreed with it.  Status is: Inpatient Remains inpatient appropriate because: Still appears very weak and fluid overloaded and hypoxic   Estimated body mass index is  43.1 kg/m as calculated from the following:   Height as of this encounter: 5\' 4"  (1.626 m).   Weight as of this encounter: 113.9 kg.    Nutritional Assessment: Body mass index is 43.1 kg/m.Marland Kitchen Seen by dietician.  I agree with the assessment and plan as outlined below: Nutrition Status:        . Skin Assessment: I have examined the patient's skin and I agree with the wound assessment as performed by the wound care RN as outlined below:    Consultants:  Cardiology  Procedures:  None  Antimicrobials:  Anti-infectives (From admission, onward)    Start     Dose/Rate Route Frequency Ordered Stop   03/02/23 2200  remdesivir 100 mg in sodium chloride 0.9 % 100 mL IVPB  Status:  Discontinued       Placed in "Followed by" Linked Group   100 mg 200 mL/hr over 30 Minutes Intravenous Daily 03/01/23 2146 03/02/23 1013   03/02/23 1015  cefTRIAXone (ROCEPHIN) 1 g in sodium chloride 0.9 % 100 mL IVPB        1 g 200 mL/hr over 30 Minutes Intravenous Every 24 hours 03/02/23 1012     03/01/23 2300  remdesivir 200 mg in sodium chloride 0.9% 250 mL IVPB       Placed in "Followed by" Linked Group   200 mg 580 mL/hr over 30 Minutes Intravenous Once 03/01/23 2146 03/02/23 0157   03/01/23 1530  ceFEPIme (MAXIPIME) 2 g in sodium chloride 0.9 % 100 mL IVPB        2 g 200 mL/hr over 30 Minutes Intravenous  Once 03/01/23 1522 03/01/23 1730   03/01/23 1530  metroNIDAZOLE (FLAGYL) IVPB 500 mg        500 mg 100 mL/hr over 60 Minutes Intravenous  Once 03/01/23 1522 03/01/23 1846   03/01/23 1530  vancomycin (VANCOCIN) IVPB 1000 mg/200 mL premix  Status:  Discontinued        1,000 mg 200 mL/hr over 60 Minutes Intravenous  Once 03/01/23 1522 03/01/23 1524   03/01/23 1530  vancomycin (VANCOREADY) IVPB 2000 mg/400 mL        2,000 mg 200 mL/hr over 120 Minutes Intravenous  Once 03/01/23 1525 03/01/23 2046         Subjective: Patient seen and examined.  She does not have any  complaint.  Objective: Vitals:   03/03/23 2030 03/03/23 2035 03/04/23 0000 03/04/23 0532  BP: 124/87  96/63 129/78  Pulse: (!) 106 79 89 82  Resp: 20  (!) 26 (!) 22  Temp: 98.3 F (36.8 C)  97.6 F (36.4 C) 98.3 F (36.8 C)  TempSrc: Oral  Oral Oral  SpO2: 94% 94% 95% 96%  Weight:      Height:        Intake/Output Summary (Last 24 hours) at 03/04/2023 1142 Last data filed at 03/04/2023 1610 Gross per 24 hour  Intake 417 ml  Output 1500 ml  Net -1083 ml   Filed Weights   03/01/23 1952  Weight: 113.9 kg    Examination:  General exam: Appears calm and comfortable, morbidly obese Respiratory system: Rhonchi with expiratory wheezes bilaterally.  Respiratory effort normal. Cardiovascular system: S1 & S2 heard, RRR. No JVD, murmurs, rubs, gallops or clicks.  +1-2 pitting edema bilateral lower extremity.  Gastrointestinal system: Abdomen is nondistended, soft and nontender. No organomegaly or masses felt. Normal bowel sounds heard. Central nervous system: Alert and oriented. No focal neurological deficits. Extremities: Symmetric 5 x 5 power. Skin: No rashes, lesions or ulcers.  Psychiatry: Judgement and insight appear normal. Mood & affect appropriate.   Data Reviewed: I have personally reviewed following labs and imaging studies  CBC: Recent Labs  Lab 03/01/23 1447 03/02/23 0127 03/02/23 0430 03/03/23 0354  WBC 10.2  --  8.3 5.8  NEUTROABS 6.3  --   --   --   HGB 14.0 10.5* 11.8* 12.5  HCT 40.8 31.0* 35.0* 35.6*  MCV 97.1  --  97.5 95.2  PLT 146*  --  128* 141*   Basic Metabolic Panel: Recent Labs  Lab 03/01/23 1447 03/02/23 0127 03/02/23 0430 03/03/23 0354 03/04/23 0341  NA 135 134* 134* 134* 134*  K 3.5 3.3* 3.7 4.0 4.2  CL 98  --  101 99 97*  CO2 23  --  19* 24 22  GLUCOSE 131*  --  186* 172* 177*  BUN 18  --  16 25* 22  CREATININE 1.00  --  1.18* 0.96 0.73  CALCIUM 9.1  --  8.2* 8.6* 9.4  MG  --   --  1.9  --   --    GFR: Estimated Creatinine  Clearance: 77.5 mL/min (by C-G formula based on SCr of 0.73 mg/dL). Liver Function Tests: Recent Labs  Lab 03/01/23 1447 03/02/23 0430 03/03/23 0354  AST 69* 60* 45*  ALT 32 25 25  ALKPHOS 47 42 38  BILITOT 2.3* 1.4* 0.6  PROT 5.9* 5.0* 5.6*  ALBUMIN 3.2* 2.6* 2.8*   No results for input(s): "LIPASE", "AMYLASE" in the last 168 hours. No results for input(s): "AMMONIA" in the last 168 hours. Coagulation Profile: Recent Labs  Lab 03/01/23 1447  INR 1.1   Cardiac Enzymes: Recent Labs  Lab 03/01/23 1447  CKTOTAL 905*   BNP (last 3 results) No results for input(s): "PROBNP" in the last 8760 hours. HbA1C: No results for input(s): "HGBA1C" in the last 72 hours. CBG: No results for input(s): "GLUCAP" in the last 168 hours. Lipid Profile: No results for input(s): "CHOL", "HDL", "LDLCALC", "TRIG", "CHOLHDL", "LDLDIRECT" in the last 72 hours. Thyroid Function Tests: Recent Labs    03/02/23 0430  TSH 1.053   Anemia Panel: Recent Labs    03/03/23 0354 03/04/23 0341  FERRITIN 383* 330*   Sepsis Labs: Recent Labs  Lab 03/01/23 1900 03/03/23 0354 03/03/23 1147 03/03/23 2041 03/04/23 0823  PROCALCITON  --  <0.10  --   --   --   LATICACIDVEN 1.2  --  2.0* 2.3* 2.2*    Recent Results (from the past 240 hours)  Blood Culture (routine x 2)     Status: None (Preliminary result)   Collection Time: 03/01/23  4:01 PM   Specimen: BLOOD RIGHT ARM  Result Value Ref Range Status   Specimen Description BLOOD RIGHT ARM  Final   Special Requests   Final    BOTTLES DRAWN AEROBIC ONLY Blood Culture results may not be optimal due to an inadequate volume of blood received in culture bottles   Culture   Final    NO GROWTH 3 DAYS Performed at [facility], [address], [place], [place] [address]    Report Status PENDING  Incomplete  Blood Culture (routine x 2)     Status: None (Preliminary result)   Collection Time: 03/01/23  4:40 PM   Specimen: BLOOD RIGHT ARM  Result  Value Ref Range Status   Specimen Description BLOOD RIGHT ARM  Final   Special Requests   Final    BOTTLES DRAWN AEROBIC AND ANAEROBIC Blood Culture adequate volume   Culture   Final    NO GROWTH 3 DAYS Performed at [facility], [address], [place], [place] [address]    Report Status PENDING  Incomplete  Resp panel by RT-PCR (RSV, Flu A&B, Covid) Anterior Nasal Swab     Status: Abnormal   Collection Time: 03/01/23  4:48 PM   Specimen: Anterior Nasal Swab  Result Value Ref Range Status   SARS Coronavirus 2 by RT PCR POSITIVE (A) NEGATIVE Final   Influenza A by PCR NEGATIVE NEGATIVE Final   Influenza B by PCR NEGATIVE NEGATIVE Final    Comment: (NOTE) The Xpert Xpress SARS-CoV-2/FLU/RSV plus assay is intended as an aid in the diagnosis of influenza from Nasopharyngeal swab specimens and should not be used as a sole basis for treatment. Nasal washings and aspirates are unacceptable for Xpert Xpress SARS-CoV-2/FLU/RSV testing.  Fact Sheet for Patients: BloggerCourse.com  Fact Sheet for Healthcare Providers: SeriousBroker.it  This test is not yet approved or cleared by the [place] FDA and has been authorized for detection and/or diagnosis of SARS-CoV-2 by FDA under an Emergency Use Authorization (EUA). This EUA will remain in effect (meaning this test can be used) for the duration of the COVID-19 declaration under Section 564(b)(1) of the Act, 21 U.S.C. section 360bbb-3(b)(1), unless the authorization is terminated or revoked.     Resp Syncytial Virus by PCR NEGATIVE NEGATIVE Final    Comment: (NOTE) Fact Sheet for Patients: BloggerCourse.com  Fact Sheet for Healthcare Providers: SeriousBroker.it  This test is not yet approved or cleared by the [place] FDA and has been authorized for detection and/or diagnosis of SARS-CoV-2 by FDA under an Emergency Use  Authorization (EUA). This EUA will remain in effect (meaning this test can be used) for the duration of the COVID-19 declaration under Section 564(b)(1) of the Act, 21 U.S.C. section 360bbb-3(b)(1), unless the authorization is terminated or revoked.  Performed at [facility], [address], [place], [place] [address]   Urine Culture     Status: None   Collection Time: 03/02/23  3:00 PM   Specimen: Urine, Random  Result Value Ref Range Status   Specimen Description URINE, RANDOM  Final   Special Requests NONE Reflexed from [id number]  Final   Culture   Final    NO GROWTH Performed at [facility], [address], [place], [place] [address]    Report Status 03/03/2023 FINAL  Final     Radiology Studies: DG CHEST PORT 1 VIEW Result Date: 03/04/2023 CLINICAL DATA:  COVID-19. EXAM: PORTABLE CHEST 1 VIEW COMPARISON:  03/01/2023 FINDINGS: Chronic sclerosis involving the right first rib. Mild blunting at the right costophrenic angle could represent atelectasis and cannot exclude small effusion. No significant airspace disease or lung consolidation. Heart size is upper limits of normal but likely accentuated by the portable AP technique. Negative for a pneumothorax. IMPRESSION: 1. Mild blunting at the right costophrenic angle could represent atelectasis and cannot exclude small effusion. Electronically Signed   By: Richarda Overlie M.D.   On: 03/04/2023 11:15   ECHOCARDIOGRAM COMPLETE Result Date: 03/02/2023    ECHOCARDIOGRAM REPORT   Patient Name:   SHIKIRA FOLINO Date of Exam: 03/02/2023 Medical Rec #:  [id number]       Height:       64.0 in Accession #:    [id number]      Weight:       251.1 lb Date of Birth:  24-Jun-1950       BSA:          2.155 m Patient Age:    72 years        BP:           126/88 mmHg Patient Gender: F               HR:           101 bpm. Exam Location:  Inpatient Procedure: 2D Echo, Cardiac Doppler and Color Doppler (Both Spectral and Color            Flow Doppler were  utilized during procedure). Indications:    atrial fibrillation  History:        Patient has no prior history of Echocardiogram examinations.                 CHF; Risk Factors:Hypertension.  Sonographer:    Amy Chionchio Referring Phys: [id number] PETER M [place]  Sonographer Comments: Image acquisition challenging due to patient body habitus. IMPRESSIONS  1. Left ventricular ejection fraction, by estimation, is 70 to 75%. The left ventricle has hyperdynamic function. Left ventricular endocardial border not optimally defined to evaluate regional wall motion. Left ventricular diastolic parameters are indeterminate.  2. Right ventricular systolic function is normal. The right ventricular size is normal. Tricuspid regurgitation signal is inadequate for assessing PA pressure.  3. The mitral valve was not well visualized. No evidence of mitral valve regurgitation. No evidence of mitral stenosis.  4. The aortic valve was not well visualized. Aortic valve regurgitation is not visualized. No aortic stenosis is present.  5. The inferior vena cava is normal in size with greater than 50% respiratory variability, suggesting right atrial pressure of 3 mmHg. FINDINGS  Left Ventricle: Left ventricular ejection fraction, by estimation, is 70 to 75%. The left ventricle has hyperdynamic function. Left ventricular endocardial border not optimally defined to evaluate regional wall motion. The left ventricular internal cavity size was normal in size. There is borderline left ventricular hypertrophy. Left ventricular diastolic parameters are indeterminate. Right Ventricle: The right ventricular size is normal. Right vetricular wall thickness was not well visualized. Right ventricular systolic function is normal. Tricuspid regurgitation signal is inadequate for assessing PA pressure. Left Atrium: Left atrial size was normal in size. Right Atrium: Right atrial size was not well visualized. Pericardium: There is no evidence of pericardial  effusion. Mitral Valve: The mitral valve was not well visualized. No evidence of mitral valve regurgitation. No evidence of mitral valve stenosis. MV peak gradient, 2.8 mmHg. The mean mitral valve gradient is 1.0 mmHg. Tricuspid Valve: The tricuspid valve is normal in structure. Tricuspid valve regurgitation is trivial. No evidence of tricuspid stenosis. Aortic Valve: The aortic valve was not well visualized. Aortic valve regurgitation is not visualized. No aortic stenosis is present. Aortic valve mean gradient measures 4.0 mmHg. Aortic valve peak gradient measures 7.6 mmHg. Pulmonic Valve: The pulmonic valve was normal in structure. Pulmonic valve regurgitation is trivial. No evidence of pulmonic stenosis. Aorta: The aortic root is normal in size and structure. Venous: The inferior vena cava is normal in size with greater than 50% respiratory variability, suggesting right atrial pressure of 3 mmHg. IAS/Shunts: The interatrial septum was not well visualized.   LV  Volumes (MOD) LV vol d, MOD A2C: 33.6 ml LV vol d, MOD A4C: 43.4 ml LV vol s, MOD A2C: 11.4 ml LV vol s, MOD A4C: 12.2 ml LV SV MOD A2C:     22.2 ml LV SV MOD A4C:     43.4 ml LV SV MOD BP:      26.8 ml RIGHT VENTRICLE          IVC RV Basal diam:  4.50 cm  IVC diam: 1.60 cm RV Mid diam:    3.70 cm TAPSE (M-mode): 2.3 cm LEFT ATRIUM           Index LA Vol (A2C): 21.6 ml 10.02 ml/m  AORTIC VALVE AV Vmax:           138.00 cm/s AV Vmean:          91.100 cm/s AV VTI:            0.153 m AV Peak Grad:      7.6 mmHg AV Mean Grad:      4.0 mmHg LVOT Vmax:         86.20 cm/s LVOT Vmean:        46.200 cm/s LVOT VTI:          0.094 m LVOT/AV VTI ratio: 0.61 MITRAL VALVE MV Peak grad: 2.8 mmHg  SHUNTS MV Mean grad: 1.0 mmHg  Systemic VTI: 0.09 m MV Vmax:      0.83 m/s MV Vmean:     51.8 cm/s Weston Brass MD Electronically signed by Weston Brass MD Signature Date/Time: 03/02/2023/4:28:15 PM    Final     Scheduled Meds:  amiodarone  400 mg Oral BID   apixaban   5 mg Oral BID   dexamethasone (DECADRON) injection  6 mg Intravenous Q24H   diltiazem  360 mg Oral Daily   divalproex  1,500 mg Oral QHS   famotidine  20 mg Oral Daily   pregabalin  50 mg Oral TID   Continuous Infusions:  cefTRIAXone (ROCEPHIN)  IV 1 g (03/04/23 0935)     LOS: 3 days   Hughie Closs, MD [facility]  03/04/2023, 11:42 AM   *Please note that this is a verbal dictation therefore any spelling or grammatical errors are due to the "Dragon Medical One" system interpretation.  Please page via Amion and do not message via secure chat for urgent patient care matters. Secure chat can be used for non urgent patient care matters.  How to contact the [facility] Attending or Consulting provider 7A - 7P or covering provider during after hours 7P -7A, for this patient?  Check the care team in [facility] and look for a) attending/consulting TR[facility] provider listed and b) the [facility] team listed. Page or secure chat 7A-7P. Log into [email or web address] and use [facility]'s universal password to access. If you do not have the password, please contact the hospital operator. Locate the [facility] provider you are looking for under [facility] and page to a number that you can be directly reached. If you still have difficulty reaching the provider, please page the [facility] (Director on Call) for the Hospitalists listed on amion for assistance.

## 2023-03-04 NOTE — Progress Notes (Signed)
   Patient Name: Donna Sexton Date of Encounter: 03/04/2023 [facility] Cardiologist: None [place]  Interval Summary  .    Doing well.  Alert and oriented.  Denies angina or shortness of breath.  Vital Signs .    Vitals:   03/03/23 2030 03/03/23 2035 03/04/23 0000 03/04/23 0532  BP: 124/87  96/63 129/78  Pulse: (!) 106 79 89 82  Resp: 20  (!) 26 (!) 22  Temp: 98.3 F (36.8 C)  97.6 F (36.4 C) 98.3 F (36.8 C)  TempSrc: Oral  Oral Oral  SpO2: 94% 94% 95% 96%  Weight:      Height:        Intake/Output Summary (Last 24 hours) at 03/04/2023 0843 Last data filed at 03/04/2023 9604 Gross per 24 hour  Intake 535 ml  Output 1500 ml  Net -965 ml      03/01/2023    7:52 PM 02/16/2023   10:04 AM 11/16/2022    8:39 AM  Last 3 Weights  Weight (lbs) 251 lb 1.7 oz 251 lb 250 lb  Weight (kg) 113.9 kg 113.853 kg 113.399 kg      Telemetry/ECG    Atrial fibrillation with controlled ventricular response- Personally Reviewed  Physical Exam .   GEN: No acute distress.  Obese. Neck: No JVD Cardiac: Irregular,  no murmurs, rubs, or gallops.  Respiratory: Clear to auscultation bilaterally. GI: Soft, nontender, non-distended  MS: No edema  Assessment & Plan .     73 year old woman with chronic diastolic heart failure, morbid obesity, type 2 diabetes mellitus hypertension, hypercholesterolemia, appearance of ventricular noncompaction on cardiac MRI presenting with new onset atrial fibrillation with rapid ventricular response in the setting of sepsis due to COVID-19 infection.  Sepsis resolved, clinically improved, rate controlled on intravenous amiodarone and anticoagulants.  No overt heart failure at this time.  Chronically on loop diuretics which have been on hold in the setting of sepsis which would need to be restarted.  Based on outpatient records her weight this year has been around 250 pounds (113 kg).  She has not been weighed since admission.  As long as she remains  hemodynamically compensated with delay consideration for cardioversion until she has fully recovered from her respiratory illness, preferably after 3 weeks of full anticoagulation.  Would plan a follow-up visit in the A-fib clinic in about 2 weeks after discharge, where elective outpatient cardioversion can be scheduled if appropriate.    For questions or updates, please contact [facility] Please consult [email or web address] for contact info under        Signed, Thurmon Fair, MD

## 2023-03-04 NOTE — Progress Notes (Signed)
 Mobilized patent to  chair x 2 assist, HR increased to 137 A-fib w/RVR, resp labored with activity. Unable to ambulate due to weakness and unstable v/s. Very weak, recommend PT consult. S/S resolving with rest. SaO2 signal lost during activity.

## 2023-03-04 NOTE — Plan of Care (Signed)
  Problem: Respiratory: Goal: Will maintain a patent airway Outcome: Progressing Goal: Complications related to the disease process, condition or treatment will be avoided or minimized Outcome: Progressing   Problem: Clinical Measurements: Goal: Ability to maintain clinical measurements within normal limits will improve Outcome: Progressing Goal: Respiratory complications will improve Outcome: Progressing Goal: Cardiovascular complication will be avoided Outcome: Progressing   Problem: Nutrition: Goal: Adequate nutrition will be maintained Outcome: Progressing   Problem: Safety: Goal: Ability to remain free from injury will improve Outcome: Progressing   Problem: Cardiac: Goal: Ability to achieve and maintain adequate cardiopulmonary perfusion will improve Outcome: Progressing

## 2023-03-04 NOTE — Progress Notes (Signed)
   03/04/23 0000  Assess: MEWS Score  Temp 97.6 F (36.4 C)  BP 96/63  MAP (mmHg) 74  Pulse Rate 89  ECG Heart Rate 88  Resp (!) 26  Level of Consciousness Alert  SpO2 95 %  O2 Device Nasal Cannula  Patient Activity (if Appropriate) In bed  O2 Flow Rate (L/min) 4 L/min  Assess: MEWS Score  MEWS Temp 0  MEWS Systolic 1  MEWS Pulse 0  MEWS RR 2  MEWS LOC 0  MEWS Score 3  MEWS Score Color Yellow  Assess: if the MEWS score is Yellow or Red  Were vital signs accurate and taken at a resting state? Yes  Does the patient meet 2 or more of the SIRS criteria? Yes  Does the patient have a confirmed or suspected source of infection? Yes  MEWS guidelines implemented  Yes, yellow  Treat  MEWS Interventions Considered administering scheduled or prn medications/treatments as ordered  Take Vital Signs  Increase Vital Sign Frequency  Yellow: Q2hr x1, continue Q4hrs until patient remains green for 12hrs  Escalate  MEWS: Escalate Yellow: Discuss with charge nurse and consider notifying provider and/or RRT  Notify: Charge Nurse/RN  Name of Charge Nurse/RN Notified Dana, RN  Assess: SIRS CRITERIA  SIRS Temperature  0  SIRS Respirations  1  SIRS Pulse 0  SIRS WBC 0  SIRS Score Sum  1

## 2023-03-05 DIAGNOSIS — U071 COVID-19: Secondary | ICD-10-CM | POA: Diagnosis not present

## 2023-03-05 DIAGNOSIS — J9601 Acute respiratory failure with hypoxia: Secondary | ICD-10-CM | POA: Diagnosis not present

## 2023-03-05 LAB — RENAL FUNCTION PANEL
Albumin: 3 g/dL — ABNORMAL LOW (ref 3.5–5.0)
Anion gap: 10 (ref 5–15)
BUN: 20 mg/dL (ref 8–23)
CO2: 21 mmol/L — ABNORMAL LOW (ref 22–32)
Calcium: 8.8 mg/dL — ABNORMAL LOW (ref 8.9–10.3)
Chloride: 102 mmol/L (ref 98–111)
Creatinine, Ser: 0.62 mg/dL (ref 0.44–1.00)
GFR, Estimated: >60 mL/min (ref >60)
Glucose, Bld: 176 mg/dL — ABNORMAL HIGH (ref 70–99)
Phosphorus: 2.7 mg/dL (ref 2.5–4.6)
Potassium: 4.6 mmol/L (ref 3.5–5.1)
Sodium: 133 mmol/L — ABNORMAL LOW (ref 135–145)

## 2023-03-05 LAB — CBC
HCT: 37.8 % (ref 36.0–46.0)
Hemoglobin: 13.6 g/dL (ref 12.0–15.0)
MCH: 33.9 pg (ref 26.0–34.0)
MCHC: 36 g/dL (ref 30.0–36.0)
MCV: 94.3 fL (ref 80.0–100.0)
Platelets: 177 10*3/uL (ref 150–400)
RBC: 4.01 MIL/uL (ref 3.87–5.11)
RDW: 12.3 % (ref 11.5–15.5)
WBC: 5.8 10*3/uL (ref 4.0–10.5)
nRBC: 0 % (ref 0.0–0.2)

## 2023-03-05 LAB — VITAMIN B12: Vitamin B-12: 631 pg/mL (ref 180–914)

## 2023-03-05 LAB — C-REACTIVE PROTEIN: CRP: 0.7 mg/dL (ref <1.0)

## 2023-03-05 LAB — AMMONIA: Ammonia: 32 umol/L (ref 9–35)

## 2023-03-05 LAB — VALPROIC ACID LEVEL: Valproic Acid Lvl: 80 ug/mL (ref 50.0–100.0)

## 2023-03-05 LAB — MAGNESIUM: Magnesium: 1.9 mg/dL (ref 1.7–2.4)

## 2023-03-05 LAB — FERRITIN: Ferritin: 309 ng/mL — ABNORMAL HIGH (ref 11–307)

## 2023-03-05 NOTE — Progress Notes (Signed)
 Remains in persistent atrial fibrillation with controlled ventricular rate. [facility] will sign off.   Medication Recommendations:   Amiodarone 400 mg twice daily until 03/09/2023, followed by 200 mg once daily after that Eliquis 5 mg twice daily Diltiazem sustained-release 360 mg once daily Other recommendations (labs, testing, etc):  If average ventricular rate is less than 65 bpm, will need to decrease the dose of diltiazem. Follow up as an outpatient:  Atrial fibrillation clinic follow-up in 2 weeks.

## 2023-03-05 NOTE — Consult Note (Signed)
 WOC Nurse Consult Note: Reason for Consult: blistering to the left FA and chest Patient was found down prone at home, unclear on etiology of blistering but noted thought to be related to fall and compression of the areas? Wound type: partial thickness bulla, some have ruptured Pressure Injury POA: NA Measurement: see nursing flow sheets Wound HQI:ONGEXB filled bulla and pink intact skin under ruptured blisters Drainage (amount, consistency, odor) see nursing flow sheets Periwound: edema, but reported to be improving  Dressing procedure/placement/frequency:  Cover blistered areas with single layer of xeroform gauze, top with dry dressing and kerlix or foam on the chest, change every 3 days    Re consult if needed, will not follow at this time. Thanks  Mava [employer]ares [employer], RN,CWOCN, CNS, CWON-AP [phone])

## 2023-03-05 NOTE — Progress Notes (Signed)
 PROGRESS NOTE  Donna Sexton WGN:[id number] DOB: Jul 31, 1950   PCP: Agapito Games, MD  Patient is from: Home.  Lives alone.  Uses rollator for ambulation.  Drives.  DOA: 03/01/2023 LOS: 4  Chief complaints Chief Complaint  Patient presents with   Fall     Brief Narrative / Interim history: 73 year old F with PMH of diastolic CHF, DM-2, bipolar disorder, HTN, morbid obesity and HLD brought to ED after found down by family members.  She last spoke to her ex-husband 24 hours prior.  Patient was unable to recall the event.  In ED, febrile to 101.  In A-fib with RVR with heart rate 160s (new).  Normotensive.  CK9 106.  COVID-19 PCR positive.  UA with bacteriuria and pyuria.  Cultures obtained. She was admitted with A-fib with RVR, severe sepsis with encephalopathy and hypoxic respiratory failure due to COVID-19.  Initially started on Cardizem drip and later transitioned to IV amiodarone.  Also started on Decadron for COVID-19 infection and ceftriaxone for UTI.  Patient's RVR resolved.  Blood and urine cultures NGTD.  Encephalopathy resolved.  Completed 5 days of IV antibiotics for possible UTI on 2/23.  Cardiology gave recommendation and signed off.  PT/OT eval pending.  Subjective: Seen and examined earlier this morning.  No major events overnight of this morning.  Reports feeling better but not able to elaborate.  She says bilateral arm numbness has improved.  Denies chest pain, palpitation or shortness of breath.  Objective: Vitals:   03/04/23 1831 03/04/23 1950 03/05/23 0423 03/05/23 0757  BP:  (!) 137/90 112/77 (!) 141/83  Pulse: 75 80 89   Resp:  20 20 20   Temp:  98.4 F (36.9 C) 97.7 F (36.5 C) 97.7 F (36.5 C)  TempSrc:  Oral Oral Oral  SpO2: 98% 97% 93%   Weight:   113.9 kg   Height:        Examination:  GENERAL: No apparent distress.  Nontoxic. HEENT: MMM.  Vision and hearing grossly intact.  NECK: Supple.  Difficult to assess JVD due to body habitus. RESP:   No IWOB.  Fair aeration bilaterally but limited exam due to body habitus. CVS:  RRR. Heart sounds normal.  ABD/GI/GU: BS+. Abd soft, NTND.  Limited exam due to body habitus MSK/EXT:  Moves extremities. No apparent deformity. No edema.  Intact and broken bulla on left arm. SKIN: As above. NEURO: Awake, alert and oriented appropriately.  No apparent focal neuro deficit. PSYCH: Calm. Normal affect.   Procedures:  None  Microbiology summarized: COVID-19 PCR positive Influenza and RSV PCR nonreactive Blood cultures NGTD Urine culture NGTD   Assessment and plan: New onset atrial fibrillation with RVR: Provoked by sepsis/COVID?  Remains in A-fib but rate controlled.  TTE with LVEF of 70 to 75% but difficult to assess for DD or RWMA.  TSH normal. -Appreciate cardiology recs  Amiodarone 400 mg twice daily until 03/09/2023, followed by 200 mg once daily after that Eliquis 5 mg twice daily Diltiazem sustained-release 360 mg once daily -Optimize electrolytes  Severe sepsis with septic encephalopathy due to COVID-19 infection: Present on arrival.  CT head without significant finding.  ABG, TSH and ammonia within normal.  UA raised concern for UTI but urine cultures and blood cultures NGTD.  She has no focal neurodeficit.  Encephalopathy likely due to COVID-19 infection.  Now resolved.  She is awake and alert and oriented x 4. -Discontinue Decadron -Reorientation and delirium precaution -Continue isolation precaution -Check Depakote level. -Check  B12 level given report of bilateral hand paresthesia  Acute respiratory failure with hypoxia ruled out.  COVID-19 PCR positive.  CXR with minimal basilar atelectasis without consolidation.  Inflammatory markers elevated. No documented desaturation but has been on supplemental oxygen.  Currently saturating in upper 90s on 3 L by [place]. -Wean oxygen to room air -Incentive spirometry, OOB/PT/OT -Ambulatory saturation   Chronic diastolic CHF: TTE as above.  She has 1+ BLE edema but difficult to assess fluid status due to body habitus.  Edema seems to have improved based on prior documentation.  Reports improvement in her respiratory status.  Intermittently diuresed with IV Lasix. -Hold diuretics -Strict intake and output, daily weight, renal functions and electrolytes   AKI: Baseline Cr 0.6-0.7.  Likely ATN due to sepsis and rhabdo.  Resolved. Recent Labs    07/19/22 1131 03/01/23 1447 03/02/23 0430 03/03/23 0354 03/04/23 0341 03/05/23 0838  BUN 8 18 16  25* 22 20  CREATININE 0.77 1.00 1.18* 0.96 0.73 0.62  -Avoid nephrotoxic meds -Continue monitoring  Anxiety, bipolar disorder: On significant dose of Depakote.  Denies recent change.  Ammonia level within normal. -Continue home Depakote and doxepin -Check Depakote level   Prediabetes: A1c 6.2%.  Never had A1c over 6.4%.  Not on medication at home.  Diabetes ruled out. -Monitor glucose with daily lab  Essential hypertension: Seems she is on labetalol and amlodipine at home.  BP slightly elevated. -Continue Cardizem as above.  Discontinue amlodipine on discharge -Monitor.  Fall at home/physical deconditioning: Lives alone and ambulates using rollator.  Still drives.  Unknown mechanism of fall but in A-fib with RVR on arrival.  Also septic and encephalopathic with COVID.  CT head and CT cervical spine without acute finding.  No focal neurodeficit.  She has broken an intact bulla on left arm.  Unclear cause. -Fall precaution -PT/OT  Bilateral hand paresthesia: CT cervical spine showed reversal of cervical lordosis with trace anterolisthesis C2 on C3, C3 on C4 and C4 on C5, likely degenerative.  Neuroexam intact. -Continue home Lyrica -Check B12  Right orbital mass: Incidental 1.9 cm right inferomedial orbital mass.  -Radiology recommended  further evaluation with nonemergent orbital MRI with and without contrast. -Will discuss with patient.  Left forearm bula/blisters: Unclear cause  for this.  Likely from the fall -Continue wound care  Traumatic rhabdomyolysis: Mild.  Resolved.   Morbid obesity class III:  Body mass index is 43.12 kg/m. -Encourage lifestyle change to lose weight          DVT prophylaxis:   apixaban (ELIQUIS) tablet 5 mg  Code Status: Full code Family Communication: None at bedside Level of care: Progressive Status is: Inpatient Remains inpatient appropriate because: New onset A-fib with RVR, severe sepsis, physical deconditioning   Final disposition: To be determined Consultants:  Cardiology  55 minutes with more than 50% spent in reviewing records, counseling patient/family and coordinating care.   Sch Meds:  Scheduled Meds:  amiodarone  400 mg Oral BID   apixaban  5 mg Oral BID   dexamethasone (DECADRON) injection  6 mg Intravenous Q24H   diltiazem  360 mg Oral Daily   divalproex  1,500 mg Oral QHS   famotidine  20 mg Oral Daily   pregabalin  50 mg Oral TID   Continuous Infusions:  cefTRIAXone (ROCEPHIN)  IV 1 g (03/05/23 0815)   PRN Meds:.acetaminophen **OR** acetaminophen, bisacodyl, ondansetron **OR** ondansetron (ZOFRAN) IV  Antimicrobials: Anti-infectives (From admission, onward)    Start  Dose/Rate Route Frequency Ordered Stop   03/02/23 2200  remdesivir 100 mg in sodium chloride 0.9 % 100 mL IVPB  Status:  Discontinued       Placed in "Followed by" Linked Group   100 mg 200 mL/hr over 30 Minutes Intravenous Daily 03/01/23 2146 03/02/23 1013   03/02/23 1015  cefTRIAXone (ROCEPHIN) 1 g in sodium chloride 0.9 % 100 mL IVPB        1 g 200 mL/hr over 30 Minutes Intravenous Every 24 hours 03/02/23 1012     03/01/23 2300  remdesivir 200 mg in sodium chloride 0.9% 250 mL IVPB       Placed in "Followed by" Linked Group   200 mg 580 mL/hr over 30 Minutes Intravenous Once 03/01/23 2146 03/02/23 0157   03/01/23 1530  ceFEPIme (MAXIPIME) 2 g in sodium chloride 0.9 % 100 mL IVPB        2 g 200 mL/hr over 30 Minutes  Intravenous  Once 03/01/23 1522 03/01/23 1730   03/01/23 1530  metroNIDAZOLE (FLAGYL) IVPB 500 mg        500 mg 100 mL/hr over 60 Minutes Intravenous  Once 03/01/23 1522 03/01/23 1846   03/01/23 1530  vancomycin (VANCOCIN) IVPB 1000 mg/200 mL premix  Status:  Discontinued        1,000 mg 200 mL/hr over 60 Minutes Intravenous  Once 03/01/23 1522 03/01/23 1524   03/01/23 1530  vancomycin (VANCOREADY) IVPB 2000 mg/400 mL        2,000 mg 200 mL/hr over 120 Minutes Intravenous  Once 03/01/23 1525 03/01/23 2046        I have personally reviewed the following labs and images: CBC: Recent Labs  Lab 03/01/23 1447 03/02/23 0127 03/02/23 0430 03/03/23 0354 03/05/23 0838  WBC 10.2  --  8.3 5.8 5.8  NEUTROABS 6.3  --   --   --   --   HGB 14.0 10.5* 11.8* 12.5 13.6  HCT 40.8 31.0* 35.0* 35.6* 37.8  MCV 97.1  --  97.5 95.2 94.3  PLT 146*  --  128* 141* 177   BMP &GFR Recent Labs  Lab 03/01/23 1447 03/02/23 0127 03/02/23 0430 03/03/23 0354 03/04/23 0341 03/05/23 0838  NA 135 134* 134* 134* 134* 133*  K 3.5 3.3* 3.7 4.0 4.2 4.6  CL 98  --  101 99 97* 102  CO2 23  --  19* 24 22 21*  GLUCOSE 131*  --  186* 172* 177* 176*  BUN 18  --  16 25* 22 20  CREATININE 1.00  --  1.18* 0.96 0.73 0.62  CALCIUM 9.1  --  8.2* 8.6* 9.4 8.8*  MG  --   --  1.9  --   --  1.9  PHOS  --   --   --   --   --  2.7   Estimated Creatinine Clearance: 77.5 mL/min (by C-G formula based on SCr of 0.62 mg/dL). Liver & Pancreas: Recent Labs  Lab 03/01/23 1447 03/02/23 0430 03/03/23 0354 03/05/23 0838  AST 69* 60* 45*  --   ALT 32 25 25  --   ALKPHOS 47 42 38  --   BILITOT 2.3* 1.4* 0.6  --   PROT 5.9* 5.0* 5.6*  --   ALBUMIN 3.2* 2.6* 2.8* 3.0*   No results for input(s): "LIPASE", "AMYLASE" in the last 168 hours. Recent Labs  Lab 03/05/23 0838  AMMONIA 32   Diabetic: No results for input(s): "HGBA1C" in the last 72  hours. No results for input(s): "GLUCAP" in the last 168 hours. Cardiac  Enzymes: Recent Labs  Lab 03/01/23 1447  CKTOTAL 905*   No results for input(s): "PROBNP" in the last 8760 hours. Coagulation Profile: Recent Labs  Lab 03/01/23 1447  INR 1.1   Thyroid Function Tests: No results for input(s): "TSH", "T4TOTAL", "FREET4", "T3FREE", "THYROIDAB" in the last 72 hours. Lipid Profile: No results for input(s): "CHOL", "HDL", "LDLCALC", "TRIG", "CHOLHDL", "LDLDIRECT" in the last 72 hours. Anemia Panel: Recent Labs    03/04/23 0341 03/05/23 0410  FERRITIN 330* 309*   Urine analysis:    Component Value Date/Time   COLORURINE YELLOW 03/02/2023 1500   APPEARANCEUR HAZY (A) 03/02/2023 1500   LABSPEC 1.028 03/02/2023 1500   PHURINE 5.0 03/02/2023 1500   GLUCOSEU NEGATIVE 03/02/2023 1500   GLUCOSEU NEGATIVE 10/23/2009 1116   HGBUR MODERATE (A) 03/02/2023 1500   BILIRUBINUR NEGATIVE 03/02/2023 1500   BILIRUBINUR large (A) 02/16/2023 1014   BILIRUBINUR neg 07/05/2016 1113   KETONESUR 5 (A) 03/02/2023 1500   PROTEINUR 30 (A) 03/02/2023 1500   UROBILINOGEN >=8.0 (A) 02/16/2023 1014   UROBILINOGEN 1.0 02/01/2010 1926   NITRITE NEGATIVE 03/02/2023 1500   LEUKOCYTESUR SMALL (A) 03/02/2023 1500   Sepsis Labs: Invalid input(s): "PROCALCITONIN", "LACTICIDVEN"  Microbiology: Recent Results (from the past 240 hours)  Blood Culture (routine x 2)     Status: None (Preliminary result)   Collection Time: 03/01/23  4:01 PM   Specimen: BLOOD RIGHT ARM  Result Value Ref Range Status   Specimen Description BLOOD RIGHT ARM  Final   Special Requests   Final    BOTTLES DRAWN AEROBIC ONLY Blood Culture results may not be optimal due to an inadequate volume of blood received in culture bottles   Culture   Final    NO GROWTH 4 DAYS Performed at [facility], [address], [place], [place] [address]    Report Status PENDING  Incomplete  Blood Culture (routine x 2)     Status: None (Preliminary result)   Collection Time: 03/01/23  4:40 PM   Specimen: BLOOD  RIGHT ARM  Result Value Ref Range Status   Specimen Description BLOOD RIGHT ARM  Final   Special Requests   Final    BOTTLES DRAWN AEROBIC AND ANAEROBIC Blood Culture adequate volume   Culture   Final    NO GROWTH 4 DAYS Performed at [facility], [address] [place], [place] [address]    Report Status PENDING  Incomplete  Resp panel by RT-PCR (RSV, Flu A&B, Covid) Anterior Nasal Swab     Status: Abnormal   Collection Time: 03/01/23  4:48 PM   Specimen: Anterior Nasal Swab  Result Value Ref Range Status   SARS Coronavirus 2 by RT PCR POSITIVE (A) NEGATIVE Final   Influenza A by PCR NEGATIVE NEGATIVE Final   Influenza B by PCR NEGATIVE NEGATIVE Final    Comment: (NOTE) The Xpert Xpress SARS-CoV-2/FLU/RSV plus assay is intended as an aid in the diagnosis of influenza from Nasopharyngeal swab specimens and should not be used as a [place] basis for treatment. Nasal washings and aspirates are unacceptable for Xpert Xpress SARS-CoV-2/FLU/RSV testing.  Fact Sheet for Patients: BloggerCourse.com  Fact Sheet for Healthcare Providers: SeriousBroker.it  This test is not yet approved or cleared by the [place] FDA and has been authorized for detection and/or diagnosis of SARS-CoV-2 by FDA under an Emergency Use Authorization (EUA). This EUA will remain in effect (meaning  this test can be used) for the duration of the COVID-19 declaration under Section 564(b)(1) of the Act, 21 U.S.C. section 360bbb-3(b)(1), unless the authorization is terminated or revoked.     Resp Syncytial Virus by PCR NEGATIVE NEGATIVE Final    Comment: (NOTE) Fact Sheet for Patients: BloggerCourse.com  Fact Sheet for Healthcare Providers: SeriousBroker.it  This test is not yet approved or cleared by the [place] FDA and has been authorized for detection and/or diagnosis of SARS-CoV-2 by FDA under  an Emergency Use Authorization (EUA). This EUA will remain in effect (meaning this test can be used) for the duration of the COVID-19 declaration under Section 564(b)(1) of the Act, 21 U.S.C. section 360bbb-3(b)(1), unless the authorization is terminated or revoked.  Performed at [facility], [address] [place], [place] [address]   Urine Culture     Status: None   Collection Time: 03/02/23  3:00 PM   Specimen: Urine, Random  Result Value Ref Range Status   Specimen Description URINE, RANDOM  Final   Special Requests NONE Reflexed from [id number]  Final   Culture   Final    NO GROWTH Performed at [facility], [address] [place], [place] [address]    Report Status 03/03/2023 FINAL  Final    Radiology Studies: No results found.    Charly Holcomb T. Jamol Ginyard [facility]  If 7PM-7AM, please contact night-coverage [email or web address] 03/05/2023, 10:55 AM

## 2023-03-06 DIAGNOSIS — F411 Generalized anxiety disorder: Secondary | ICD-10-CM | POA: Diagnosis not present

## 2023-03-06 DIAGNOSIS — U071 COVID-19: Secondary | ICD-10-CM | POA: Diagnosis not present

## 2023-03-06 DIAGNOSIS — I4891 Unspecified atrial fibrillation: Secondary | ICD-10-CM | POA: Diagnosis not present

## 2023-03-06 DIAGNOSIS — I5032 Chronic diastolic (congestive) heart failure: Secondary | ICD-10-CM | POA: Diagnosis not present

## 2023-03-06 LAB — MAGNESIUM: Magnesium: 1.8 mg/dL (ref 1.7–2.4)

## 2023-03-06 LAB — CBC
HCT: 36.7 % (ref 36.0–46.0)
Hemoglobin: 12.7 g/dL (ref 12.0–15.0)
MCH: 33.7 pg (ref 26.0–34.0)
MCHC: 34.6 g/dL (ref 30.0–36.0)
MCV: 97.3 fL (ref 80.0–100.0)
Platelets: 154 10*3/uL (ref 150–400)
RBC: 3.77 MIL/uL — ABNORMAL LOW (ref 3.87–5.11)
RDW: 12.4 % (ref 11.5–15.5)
WBC: 8.1 10*3/uL (ref 4.0–10.5)
nRBC: 0 % (ref 0.0–0.2)

## 2023-03-06 LAB — COMPREHENSIVE METABOLIC PANEL
ALT: 26 U/L (ref 0–44)
AST: 24 U/L (ref 15–41)
Albumin: 2.7 g/dL — ABNORMAL LOW (ref 3.5–5.0)
Alkaline Phosphatase: 42 U/L (ref 38–126)
Anion gap: 14 (ref 5–15)
BUN: 20 mg/dL (ref 8–23)
CO2: 19 mmol/L — ABNORMAL LOW (ref 22–32)
Calcium: 8.8 mg/dL — ABNORMAL LOW (ref 8.9–10.3)
Chloride: 100 mmol/L (ref 98–111)
Creatinine, Ser: 0.71 mg/dL (ref 0.44–1.00)
GFR, Estimated: >60 mL/min (ref >60)
Glucose, Bld: 159 mg/dL — ABNORMAL HIGH (ref 70–99)
Potassium: 4.8 mmol/L (ref 3.5–5.1)
Sodium: 133 mmol/L — ABNORMAL LOW (ref 135–145)
Total Bilirubin: 0.5 mg/dL (ref 0.0–1.2)
Total Protein: 5.1 g/dL — ABNORMAL LOW (ref 6.5–8.1)

## 2023-03-06 LAB — CULTURE, BLOOD (ROUTINE X 2)
Culture: NO GROWTH
Culture: NO GROWTH
Special Requests: ADEQUATE

## 2023-03-06 LAB — CK: Total CK: 49 U/L (ref 38–234)

## 2023-03-06 NOTE — Progress Notes (Signed)
 PROGRESS NOTE  ILAH BOULE ZOX:[id number] DOB: 06/19/1950   PCP: Agapito Games, MD  Patient is from: Home.  Lives alone.  Uses rollator for ambulation.  Drives.  DOA: 03/01/2023 LOS: 5  Chief complaints Chief Complaint  Patient presents with   Fall     Brief Narrative / Interim history: 73 year old F with PMH of diastolic CHF, DM-2, bipolar disorder, HTN, morbid obesity and HLD brought to ED after found down by family members.  She last spoke to her ex-husband 24 hours prior.  Patient was unable to recall the event.  In ED, febrile to 101.  In A-fib with RVR with heart rate 160s (new).  Normotensive.  CK9 106.  COVID-19 PCR positive.  UA with bacteriuria and pyuria.  Cultures obtained. She was admitted with A-fib with RVR, severe sepsis with encephalopathy and hypoxic respiratory failure due to COVID-19.  Initially started on Cardizem drip and later transitioned to IV amiodarone.  Also started on Decadron for COVID-19 infection and ceftriaxone for UTI.  Patient's RVR resolved.  Blood and urine cultures NGTD.  Encephalopathy resolved.  Completed 5 days of IV antibiotics for possible UTI on 2/23.  Cardiology gave recommendation and signed off.  PT/OT eval pending.  Subjective: Seen and examined earlier this morning.  No major events overnight of this morning.  She said she had a nightmare last night.  She thought she was driving the schoolbus and called her friend at 4 AM this morning.  She feels bad about this.  She is tearful.  Otherwise, she feels better.  She is awake and alert and oriented x 4.  Objective: Vitals:   03/05/23 0423 03/05/23 0757 03/05/23 1941 03/06/23 0410  BP: 112/77 (!) 141/83 (!) 157/97   Pulse: 89  95   Resp: 20 20 20 20   Temp: 97.7 F (36.5 C) 97.7 F (36.5 C) 98.8 F (37.1 C) 98.1 F (36.7 C)  TempSrc: Oral Oral Oral Oral  SpO2: 93%  95% 91%  Weight: 113.9 kg   111.9 kg  Height:        Examination:  GENERAL: No apparent distress.   Nontoxic. HEENT: MMM.  Vision and hearing grossly intact.  NECK: Supple.  Difficult to assess JVD due to body habitus. RESP:  No IWOB.  Fair aeration bilaterally but limited exam due to body habitus. CVS:  RRR. Heart sounds normal.  ABD/GI/GU: BS+. Abd soft, NTND.  Limited exam due to body habitus MSK/EXT:  Moves extremities. No apparent deformity. No edema.  Intact and broken bulla on left arm. SKIN: As above. NEURO: Awake, alert and oriented appropriately.  No apparent focal neuro deficit. PSYCH: Calm. Normal affect.   Procedures:  None  Microbiology summarized: COVID-19 PCR positive Influenza and RSV PCR nonreactive Blood cultures NGTD Urine culture NGTD   Assessment and plan: New onset atrial fibrillation with RVR: Provoked by sepsis/COVID?  Remains in A-fib but rate controlled.  TTE with LVEF of 70 to 75% but difficult to assess for DD or RWMA.  TSH normal. -Appreciate cardiology recs  Amiodarone 400 mg twice daily until 03/09/2023, followed by 200 mg once daily after that Eliquis 5 mg twice daily Diltiazem sustained-release 360 mg once daily -Optimize electrolytes  Severe sepsis with septic encephalopathy due to COVID-19 infection: Present on arrival.  CT head without significant finding.  ABG, TSH, B12, Depakote level and ammonia within normal.  UA raised concern for UTI but urine cultures and blood cultures NGTD.  She has no focal neurodeficit.  Encephalopathy likely due to COVID-19 infection.  Now resolved.  She is awake and alert and oriented x 4. -Discontinued Decadron on 2/23. -Reorientation and delirium precaution -Continue isolation precaution per protocol -PT/OT eval  Acute respiratory failure with hypoxia ruled out.  COVID-19 PCR positive.  CXR with minimal basilar atelectasis without consolidation.  Inflammatory markers elevated. No documented desaturation but has been on supplemental oxygen.  Currently saturating in 90s on 2 L by [place]. -Wean oxygen to room  air -Incentive spirometry, ambulation, OOB/PT/OT   Chronic diastolic CHF: TTE as above. She has 1+ BLE edema but difficult to assess fluid status due to body habitus.  Edema seems to have improved based on prior documentation.  Reports improvement in her respiratory status.  Intermittently diuresed with IV Lasix. -Continue holding diuretics. -Strict intake and output, daily weight, renal functions and electrolytes   AKI: Baseline Cr 0.6-0.7.  Likely ATN due to sepsis and rhabdo.  Resolved. Recent Labs    07/19/22 1131 03/01/23 1447 03/02/23 0430 03/03/23 0354 03/04/23 0341 03/05/23 0838 03/06/23 0421  BUN 8 18 16  25* 22 20 20   CREATININE 0.77 1.00 1.18* 0.96 0.73 0.62 0.71  -Avoid nephrotoxic meds -Continue monitoring  Anxiety, bipolar disorder: On significant dose of Depakote.  Denies recent change.  Depakote and ammonia level within normal. -Continue home Depakote and doxepin   Prediabetes: A1c 6.2%.  Never had A1c over 6.4%.  Not on medication at home.  Diabetes ruled out. -Monitor glucose with daily lab  Essential hypertension: Seems she is on labetalol and amlodipine at home.  BP slightly elevated. -Continue Cardizem as above.  Discontinue amlodipine on discharge -Monitor.  Fall at home/physical deconditioning: Lives alone and ambulates using rollator.  Still drives.  Unknown mechanism of fall but in A-fib with RVR on arrival.  Also septic and encephalopathic with COVID.  CT head and CT cervical spine without acute finding.  No focal neurodeficit.  She has broken an intact bulla on left arm.  Unclear cause. -Fall precaution -PT/OT evaluation pending  Bilateral hand paresthesia: CT cervical spine showed reversal of cervical lordosis with trace anterolisthesis C2 on C3, C3 on C4 and C4 on C5, likely degenerative.  Neuroexam intact.  B12 within normal. -Continue home Lyrica  Right orbital mass: Incidental 1.9 cm right inferomedial orbital mass.  -Radiology recommended   further evaluation with nonemergent orbital MRI with and without contrast. -Will discuss with patient  Left forearm bula/blisters: Unclear cause for this.  Likely from the fall -Continue wound care  Traumatic rhabdomyolysis: Mild.  Resolved.   Morbid obesity class III:  Body mass index is 42.33 kg/m. -Encourage lifestyle change to lose weight          DVT prophylaxis:   apixaban (ELIQUIS) tablet 5 mg  Code Status: Full code Family Communication: None at bedside Level of care: Progressive Status is: Inpatient Remains inpatient appropriate because: New onset A-fib with RVR, severe sepsis, physical deconditioning   Final disposition: To be determined after therapy evaluation Consultants:  Cardiology  55 minutes with more than 50% spent in reviewing records, counseling patient/family and coordinating care.   Sch Meds:  Scheduled Meds:  amiodarone  400 mg Oral BID   apixaban  5 mg Oral BID   diltiazem  360 mg Oral Daily   divalproex  1,500 mg Oral QHS   famotidine  20 mg Oral Daily   pregabalin  50 mg Oral TID   Continuous Infusions:   PRN Meds:.acetaminophen **OR** acetaminophen, bisacodyl, ondansetron **OR** ondansetron (  ZOFRAN) IV  Antimicrobials: Anti-infectives (From admission, onward)    Start     Dose/Rate Route Frequency Ordered Stop   03/02/23 2200  remdesivir 100 mg in sodium chloride 0.9 % 100 mL IVPB  Status:  Discontinued       Placed in "Followed by" Linked Group   100 mg 200 mL/hr over 30 Minutes Intravenous Daily 03/01/23 2146 03/02/23 1013   03/02/23 1015  cefTRIAXone (ROCEPHIN) 1 g in sodium chloride 0.9 % 100 mL IVPB        1 g 200 mL/hr over 30 Minutes Intravenous Every 24 hours 03/02/23 1012 03/06/23 0847   03/01/23 2300  remdesivir 200 mg in sodium chloride 0.9% 250 mL IVPB       Placed in "Followed by" Linked Group   200 mg 580 mL/hr over 30 Minutes Intravenous Once 03/01/23 2146 03/02/23 0157   03/01/23 1530  ceFEPIme (MAXIPIME) 2 g in  sodium chloride 0.9 % 100 mL IVPB        2 g 200 mL/hr over 30 Minutes Intravenous  Once 03/01/23 1522 03/01/23 1730   03/01/23 1530  metroNIDAZOLE (FLAGYL) IVPB 500 mg        500 mg 100 mL/hr over 60 Minutes Intravenous  Once 03/01/23 1522 03/01/23 1846   03/01/23 1530  vancomycin (VANCOCIN) IVPB 1000 mg/200 mL premix  Status:  Discontinued        1,000 mg 200 mL/hr over 60 Minutes Intravenous  Once 03/01/23 1522 03/01/23 1524   03/01/23 1530  vancomycin (VANCOREADY) IVPB 2000 mg/400 mL        2,000 mg 200 mL/hr over 120 Minutes Intravenous  Once 03/01/23 1525 03/01/23 2046        I have personally reviewed the following labs and images: CBC: Recent Labs  Lab 03/01/23 1447 03/02/23 0127 03/02/23 0430 03/03/23 0354 03/05/23 0838 03/06/23 0421  WBC 10.2  --  8.3 5.8 5.8 8.1  NEUTROABS 6.3  --   --   --   --   --   HGB 14.0 10.5* 11.8* 12.5 13.6 12.7  HCT 40.8 31.0* 35.0* 35.6* 37.8 36.7  MCV 97.1  --  97.5 95.2 94.3 97.3  PLT 146*  --  128* 141* 177 154   BMP &GFR Recent Labs  Lab 03/02/23 0430 03/03/23 0354 03/04/23 0341 03/05/23 0838 03/06/23 0421  NA 134* 134* 134* 133* 133*  K 3.7 4.0 4.2 4.6 4.8  CL 101 99 97* 102 100  CO2 19* 24 22 21* 19*  GLUCOSE 186* 172* 177* 176* 159*  BUN 16 25* 22 20 20   CREATININE 1.18* 0.96 0.73 0.62 0.71  CALCIUM 8.2* 8.6* 9.4 8.8* 8.8*  MG 1.9  --   --  1.9 1.8  PHOS  --   --   --  2.7  --    Estimated Creatinine Clearance: 76.7 mL/min (by C-G formula based on SCr of 0.71 mg/dL). Liver & Pancreas: Recent Labs  Lab 03/01/23 1447 03/02/23 0430 03/03/23 0354 03/05/23 0838 03/06/23 0421  AST 69* 60* 45*  --  24  ALT 32 25 25  --  26  ALKPHOS 47 42 38  --  42  BILITOT 2.3* 1.4* 0.6  --  0.5  PROT 5.9* 5.0* 5.6*  --  5.1*  ALBUMIN 3.2* 2.6* 2.8* 3.0* 2.7*   No results for input(s): "LIPASE", "AMYLASE" in the last 168 hours. Recent Labs  Lab 03/05/23 0838  AMMONIA 32   Diabetic: No results for input(s): "HGBA1C"  in  the last 72 hours. No results for input(s): "GLUCAP" in the last 168 hours. Cardiac Enzymes: Recent Labs  Lab 03/01/23 1447 03/06/23 0421  CKTOTAL 905* 49   No results for input(s): "PROBNP" in the last 8760 hours. Coagulation Profile: Recent Labs  Lab 03/01/23 1447  INR 1.1   Thyroid Function Tests: No results for input(s): "TSH", "T4TOTAL", "FREET4", "T3FREE", "THYROIDAB" in the last 72 hours. Lipid Profile: No results for input(s): "CHOL", "HDL", "LDLCALC", "TRIG", "CHOLHDL", "LDLDIRECT" in the last 72 hours. Anemia Panel: Recent Labs    03/04/23 0341 03/05/23 0403 03/05/23 0410  VITAMINB12  --  631  --   FERRITIN 330*  --  309*   Urine analysis:    Component Value Date/Time   COLORURINE YELLOW 03/02/2023 1500   APPEARANCEUR HAZY (A) 03/02/2023 1500   LABSPEC 1.028 03/02/2023 1500   PHURINE 5.0 03/02/2023 1500   GLUCOSEU NEGATIVE 03/02/2023 1500   GLUCOSEU NEGATIVE 10/23/2009 1116   HGBUR MODERATE (A) 03/02/2023 1500   BILIRUBINUR NEGATIVE 03/02/2023 1500   BILIRUBINUR large (A) 02/16/2023 1014   BILIRUBINUR neg 07/05/2016 1113   KETONESUR 5 (A) 03/02/2023 1500   PROTEINUR 30 (A) 03/02/2023 1500   UROBILINOGEN >=8.0 (A) 02/16/2023 1014   UROBILINOGEN 1.0 02/01/2010 1926   NITRITE NEGATIVE 03/02/2023 1500   LEUKOCYTESUR SMALL (A) 03/02/2023 1500   Sepsis Labs: Invalid input(s): "PROCALCITONIN", "LACTICIDVEN"  Microbiology: Recent Results (from the past 240 hours)  Blood Culture (routine x 2)     Status: None   Collection Time: 03/01/23  4:01 PM   Specimen: BLOOD RIGHT ARM  Result Value Ref Range Status   Specimen Description BLOOD RIGHT ARM  Final   Special Requests   Final    BOTTLES DRAWN AEROBIC ONLY Blood Culture results may not be optimal due to an inadequate volume of blood received in culture bottles   Culture   Final    NO GROWTH 5 DAYS Performed at [facility], [address] [place], [place] [address]    Report Status 03/06/2023  FINAL  Final  Blood Culture (routine x 2)     Status: None   Collection Time: 03/01/23  4:40 PM   Specimen: BLOOD RIGHT ARM  Result Value Ref Range Status   Specimen Description BLOOD RIGHT ARM  Final   Special Requests   Final    BOTTLES DRAWN AEROBIC AND ANAEROBIC Blood Culture adequate volume   Culture   Final    NO GROWTH 5 DAYS Performed at [facility], [address] [place], [place] [address]    Report Status 03/06/2023 FINAL  Final  Resp panel by RT-PCR (RSV, Flu A&B, Covid) Anterior Nasal Swab     Status: Abnormal   Collection Time: 03/01/23  4:48 PM   Specimen: Anterior Nasal Swab  Result Value Ref Range Status   SARS Coronavirus 2 by RT PCR POSITIVE (A) NEGATIVE Final   Influenza A by PCR NEGATIVE NEGATIVE Final   Influenza B by PCR NEGATIVE NEGATIVE Final    Comment: (NOTE) The Xpert Xpress SARS-CoV-2/FLU/RSV plus assay is intended as an aid in the diagnosis of influenza from Nasopharyngeal swab specimens and should not be used as a sole basis for treatment. Nasal washings and aspirates are unacceptable for Xpert Xpress SARS-CoV-2/FLU/RSV testing.  Fact Sheet for Patients: BloggerCourse.com  Fact Sheet for Healthcare Providers: SeriousBroker.it  This test is not yet approved or cleared by the [place] FDA and has been authorized for detection  and/or diagnosis of SARS-CoV-2 by FDA under an Emergency Use Authorization (EUA). This EUA will remain in effect (meaning this test can be used) for the duration of the COVID-19 declaration under Section 564(b)(1) of the Act, 21 U.S.C. section 360bbb-3(b)(1), unless the authorization is terminated or revoked.     Resp Syncytial Virus by PCR NEGATIVE NEGATIVE Final    Comment: (NOTE) Fact Sheet for Patients: BloggerCourse.com  Fact Sheet for Healthcare Providers: SeriousBroker.it  This test is not yet  approved or cleared by the [place] FDA and has been authorized for detection and/or diagnosis of SARS-CoV-2 by FDA under an Emergency Use Authorization (EUA). This EUA will remain in effect (meaning this test can be used) for the duration of the COVID-19 declaration under Section 564(b)(1) of the Act, 21 U.S.C. section 360bbb-3(b)(1), unless the authorization is terminated or revoked.  Performed at [facility], [address], [place], [place] [address]   Urine Culture     Status: None   Collection Time: 03/02/23  3:00 PM   Specimen: Urine, Random  Result Value Ref Range Status   Specimen Description URINE, RANDOM  Final   Special Requests NONE Reflexed from [id number]  Final   Culture   Final    NO GROWTH Performed at [facility], [address], [place], [place] [address]    Report Status 03/03/2023 FINAL  Final    Radiology Studies: No results found.    Garnetta Fedrick T. Markez [facility]  If 7PM-7AM, please contact night-coverage [email or web address] 03/06/2023, 1:42 PM

## 2023-03-06 NOTE — Plan of Care (Signed)
  Problem: Education: Goal: Knowledge of risk factors and measures for prevention of condition will improve Outcome: Progressing   Problem: Coping: Goal: Psychosocial and spiritual needs will be supported Outcome: Progressing   Problem: Respiratory: Goal: Will maintain a patent airway Outcome: Progressing Goal: Complications related to the disease process, condition or treatment will be avoided or minimized Outcome: Progressing   Problem: Education: Goal: Knowledge of General Education information will improve Description: Including pain rating scale, medication(s)/side effects and non-pharmacologic comfort measures Outcome: Progressing   Problem: Health Behavior/Discharge Planning: Goal: Ability to manage health-related needs will improve Outcome: Progressing   Problem: Clinical Measurements: Goal: Ability to maintain clinical measurements within normal limits will improve Outcome: Progressing Goal: Will remain free from infection Outcome: Progressing Goal: Diagnostic test results will improve Outcome: Progressing Goal: Respiratory complications will improve Outcome: Progressing Goal: Cardiovascular complication will be avoided Outcome: Progressing   Problem: Activity: Goal: Risk for activity intolerance will decrease Outcome: Progressing   Problem: Nutrition: Goal: Adequate nutrition will be maintained Outcome: Progressing   Problem: Coping: Goal: Level of anxiety will decrease Outcome: Progressing   Problem: Elimination: Goal: Will not experience complications related to bowel motility Outcome: Progressing Goal: Will not experience complications related to urinary retention Outcome: Progressing   Problem: Pain Managment: Goal: General experience of comfort will improve and/or be controlled Outcome: Progressing   Problem: Safety: Goal: Ability to remain free from injury will improve Outcome: Progressing   Problem: Skin Integrity: Goal: Risk for impaired  skin integrity will decrease Outcome: Progressing   Problem: Education: Goal: Knowledge of disease or condition will improve Outcome: Progressing Goal: Understanding of medication regimen will improve Outcome: Progressing Goal: Individualized Educational Video(s) Outcome: Progressing   Problem: Activity: Goal: Ability to tolerate increased activity will improve Outcome: Progressing   Problem: Cardiac: Goal: Ability to achieve and maintain adequate cardiopulmonary perfusion will improve Outcome: Progressing   Problem: Health Behavior/Discharge Planning: Goal: Ability to safely manage health-related needs after discharge will improve Outcome: Progressing

## 2023-03-06 NOTE — Evaluation (Signed)
 Occupational Therapy Evaluation Patient Details Name: Donna Sexton MRN: [id number] DOB: Oct 23, 1950 Today's Date: 03/06/2023   History of Present Illness   Pt is a 73 yo female admitted after being found down at home by a neighbor. Pt with afib with RVR, +covid, UTI, sepsis with encephalopathy. CT of head and spine -.  PMH: obesity, bipolar d/o, CHF, HTN, DM.     Clinical Impressions Pt admitted with the above diagnosis and has the deficits outlined below. Pt would benefit from cont OT to increase independence with basic adls and adl transfers so she can eventually return home. Pt lives alone and neighbors check in on her regularly.  Pt is normally independent with basic adls using rollator and drives most days.  Pt gets very SOB at this time doing any adls with HR up to 141 during activity.  Pt's O2 was removed for the entire session and stayed above 92% on RA. Spoke to nursing and left O2 off.  Nursing aware. For now, feel pt will need <3 hours of therapy a day prior to returning home as she states nobody can stay with her around the clock for a few days.  If pt progresses, pt may be able to d/c home with HHOT.       If plan is discharge home, recommend the following:   A little help with walking and/or transfers;A lot of help with bathing/dressing/bathroom;Assistance with cooking/housework;Assist for transportation     Functional Status Assessment   Patient has had a recent decline in their functional status and demonstrates the ability to make significant improvements in function in a reasonable and predictable amount of time.     Equipment Recommendations   None recommended by OT     Recommendations for Other Services         Precautions/Restrictions   Precautions Precautions: Fall;Other (comment) (airborne precautions) Recall of Precautions/Restrictions: Intact Precaution/Restrictions Comments: airborne precautions Restrictions Weight Bearing Restrictions Per  Provider Order: No     Mobility Bed Mobility Overal bed mobility: Needs Assistance Bed Mobility: Supine to Sit     Supine to sit: Min assist, HOB elevated, Used rails     General bed mobility comments: Did not need physical assist but did depend on bedrails.    Transfers Overall transfer level: Needs assistance Equipment used: Rolling walker (2 wheels) Transfers: Sit to/from Stand, Bed to chair/wheelchair/BSC Sit to Stand: Min assist, From elevated surface     Step pivot transfers: Min assist, From elevated surface     General transfer comment: cues to push up from surface she was leaving. Pt is accustomed to using a rollator and does much better when she gives herself assist by pushing up from the bed or chair when standing.      Balance Overall balance assessment: Needs assistance Sitting-balance support: Feet supported Sitting balance-Leahy Scale: Good     Standing balance support: Bilateral upper extremity supported, Reliant on assistive device for balance Standing balance-Leahy Scale: Poor Standing balance comment: Pt must have outside device. Pt very anxious about standing but once reassured, pt settled down.                           ADL either performed or assessed with clinical judgement   ADL Overall ADL's : Needs assistance/impaired Eating/Feeding: Independent;Sitting   Grooming: Wash/dry hands;Wash/dry face;Oral care;Set up;Sitting   Upper Body Bathing: Set up;Sitting   Lower Body Bathing: Moderate assistance;Sit to/from stand;Cueing for compensatory techniques Lower  Body Bathing Details (indicate cue type and reason): gets very SOB and fatigued Upper Body Dressing : Set up;Sitting   Lower Body Dressing: Moderate assistance;Sit to/from stand;Cueing for compensatory techniques Lower Body Dressing Details (indicate cue type and reason): gets SOB and fatigued.  May benefit from AE Toilet Transfer: Minimal assistance;Stand-pivot;Rolling walker  (2 wheels) Toilet Transfer Details (indicate cue type and reason): from high surface Toileting- Clothing Manipulation and Hygiene: Sit to/from stand;Moderate assistance;Cueing for compensatory techniques Toileting - Clothing Manipulation Details (indicate cue type and reason): Pt gets very nervous on her feet and starts to hyperventilate. When therapist focused on slowing pts breathing down, she settled in and moved better with less anxiety.     Functional mobility during ADLs: Rolling walker (2 wheels);Minimal assistance General ADL Comments: Pt able to take a few steps to get to chair but gets very SOB . Some due to anxiety about being up and some due to her respiratory illness.     Vision Baseline Vision/History: 0 No visual deficits Ability to See in Adequate Light: 0 Adequate Patient Visual Report: No change from baseline Vision Assessment?: No apparent visual deficits     Perception Perception: Within Functional Limits       Praxis Praxis: WFL       Pertinent Vitals/Pain Pain Assessment Pain Assessment: No/denies pain     Extremity/Trunk Assessment Upper Extremity Assessment Upper Extremity Assessment: Overall WFL for tasks assessed   Lower Extremity Assessment Lower Extremity Assessment: Defer to PT evaluation   Cervical / Trunk Assessment Cervical / Trunk Assessment: Normal   Communication Communication Communication: No apparent difficulties   Cognition Arousal: Alert Behavior During Therapy: WFL for tasks assessed/performed Cognition: No apparent impairments                               Following commands: Intact       Cueing  General Comments   Cueing Techniques: Verbal cues      Exercises     Shoulder Instructions      Home Living Family/patient expects to be discharged to:: Private residence Living Arrangements: Alone Available Help at Discharge: Friend(s);Available PRN/intermittently Type of Home: House Home Access: Level  entry     Home Layout: Two level;Able to live on main level with bedroom/bathroom     Bathroom Shower/Tub: Producer, television/film/video: Handicapped height     Home Equipment: Rollator (4 wheels);Shower seat   Additional Comments: uses rollator at all times      Prior Functioning/Environment Prior Level of Function : Independent/Modified Independent;Driving;History of Falls (last six months)             Mobility Comments: uses rollator at all times indoors and out ADLs Comments: mod I with rollator and shower chair; high commode.    OT Problem List: Decreased strength;Decreased activity tolerance;Impaired balance (sitting and/or standing);Decreased knowledge of use of DME or AE;Obesity   OT Treatment/Interventions: Self-care/ADL training;DME and/or AE instruction;Therapeutic activities;Balance training;Energy conservation      OT Goals(Current goals can be found in the care plan section)   Acute Rehab OT Goals Patient Stated Goal: to get my energy and strength back OT Goal Formulation: With patient Time For Goal Achievement: 03/20/23 Potential to Achieve Goals: Good ADL Goals Pt Will Perform Grooming: with supervision;standing Pt Will Perform Lower Body Bathing: with supervision;with adaptive equipment;sit to/from stand Pt Will Perform Lower Body Dressing: with supervision;with adaptive equipment;sit to/from stand Pt Will  Perform Tub/Shower Transfer: Shower transfer;with supervision;shower seat;ambulating;rolling walker Additional ADL Goal #1: Pt will walk to bathroom and complete all toileting with supervision and rolling walker. Additional ADL Goal #2: Pt will state 3 things she can do at home during adls to conserve energy without cues.   OT Frequency:  Min 1X/week    Co-evaluation              AM-PAC OT "6 Clicks" Daily Activity     Outcome Measure Help from another person eating meals?: None Help from another person taking care of personal  grooming?: A Little Help from another person toileting, which includes using toliet, bedpan, or urinal?: A Lot Help from another person bathing (including washing, rinsing, drying)?: A Lot Help from another person to put on and taking off regular upper body clothing?: A Little Help from another person to put on and taking off regular lower body clothing?: A Lot 6 Click Score: 16   End of Session Equipment Utilized During Treatment: Rolling walker (2 wheels) Nurse Communication: Mobility status;Other (comment) (Pt on RA during session with O2 sats above 92%. Spoke to nursing and O2 removed.)  Activity Tolerance: Patient limited by fatigue Patient left: in chair;with call bell/phone within reach;with chair alarm set  OT Visit Diagnosis: Unsteadiness on feet (R26.81);History of falling (Z91.81)                Time: 1610-9604 OT Time Calculation (min): 26 min Charges:  OT General Charges $OT Visit: 1 Visit OT Evaluation $OT Eval Moderate Complexity: 1 Mod OT Treatments $Self Care/Home Management : 8-22 mins  Hope Budds 03/06/2023, 1:59 PM

## 2023-03-06 NOTE — Evaluation (Signed)
 Physical Therapy Evaluation Patient Details Name: Donna Sexton MRN: [id number] DOB: October 13, 1950 Today's Date: 03/06/2023  History of Present Illness  Pt is a 73 yo female admitted after being found down at home by a neighbor. Pt with afib with RVR, +covid, UTI, sepsis with encephalopathy. CT of head and spine -.  PMH: obesity, bipolar d/o, CHF, HTN, DM.   Clinical Impression  Pt presents with condition above and deficits mentioned below, see PT Problem List. PTA, she was mod I utilizing a rollator for functional mobility, living alone in a 2-level house with a level entrance. Currently, pt is demonstrating deficits in gross strength, endurance, power, and balance. She is at high risk for falls, needing minA to transfer to stand and only tolerating a few steps with a RW before needing to sit to rest. She maintains a very flexed posture when standing due to generalizes weakness. Session also limited by her HR elevating up to 147 bpm when standing, but it quickly recovered once sitting. At this time, pt could benefit from short-term inpatient rehab, < 3 hours/day. Will continue to follow acutely.        If plan is discharge home, recommend the following: A little help with walking and/or transfers;A little help with bathing/dressing/bathroom;Assistance with cooking/housework;Assist for transportation;Help with stairs or ramp for entrance   Can travel by private vehicle   Yes    Equipment Recommendations BSC/3in1  Recommendations for Other Services       Functional Status Assessment Patient has had a recent decline in their functional status and demonstrates the ability to make significant improvements in function in a reasonable and predictable amount of time.     Precautions / Restrictions Precautions Precautions: Fall;Other (comment) (airborne precautions) Recall of Precautions/Restrictions: Intact Precaution/Restrictions Comments: airborne precautions; watch HR Restrictions Weight  Bearing Restrictions Per Provider Order: No      Mobility  Bed Mobility Overal bed mobility: Needs Assistance Bed Mobility: Sit to Supine       Sit to supine: Min assist, HOB elevated, Used rails   General bed mobility comments: Pt needed minA at each foot to lift onto bed. Pt utilized bed rails to reposition herself in bed    Transfers Overall transfer level: Needs assistance Equipment used: Rolling walker (2 wheels) Transfers: Sit to/from Stand, Bed to chair/wheelchair/BSC Sit to Stand: Min assist   Step pivot transfers: Min assist       General transfer comment: Pt pushed up from the recliner to power up to stand, but needed extra time and minA to power up to stand and gain balance. Pt maintained a lot of trunk flexion despite cues to push through arms to stand upright. Noted shakiness throughout body with extra effort needed to stand and step pivot to R from recliner to EOB, minA for balance.    Ambulation/Gait Ambulation/Gait assistance: Min assist Gait Distance (Feet): 2 Feet Assistive device: Rolling walker (2 wheels) Gait Pattern/deviations: Step-to pattern, Decreased step length - right, Decreased step length - left, Decreased stride length, Trunk flexed, Shuffle Gait velocity: reduced Gait velocity interpretation: <1.31 ft/sec, indicative of household ambulator   General Gait Details: Pt maintained a lot of trunk flexion despite cues to push through arms to stand upright. Pt only able to tolerate taking a few steps to pivot from recliner to EOB before needing to sit due to fatigue. She took slow, small, shuffling steps. Noted increased effort and shakiness throughout.  Stairs  Wheelchair Mobility     Tilt Bed    Modified Rankin (Stroke Patients Only)       Balance Overall balance assessment: Needs assistance Sitting-balance support: Feet supported Sitting balance-Leahy Scale: Good     Standing balance support: Bilateral upper extremity  supported, Reliant on assistive device for balance Standing balance-Leahy Scale: Poor Standing balance comment: Reliant on RW and minA, very flexed posture when standing today                             Pertinent Vitals/Pain Pain Assessment Pain Assessment: Faces Faces Pain Scale: Hurts a little bit Pain Location: generalized with mobility Pain Descriptors / Indicators: Discomfort, Grimacing Pain Intervention(s): Limited activity within patient's tolerance, Monitored during session, Repositioned    Home Living Family/patient expects to be discharged to:: Private residence Living Arrangements: Alone Available Help at Discharge: Friend(s);Available PRN/intermittently Type of Home: House Home Access: Level entry       Home Layout: Two level;Able to live on main level with bedroom/bathroom Home Equipment: Rollator (4 wheels);Shower seat Additional Comments: uses rollator at all times    Prior Function Prior Level of Function : Independent/Modified Independent;Driving;History of Falls (last six months)             Mobility Comments: uses rollator at all times indoors and out ADLs Comments: mod I with rollator and shower chair; high commode.     Extremity/Trunk Assessment   Upper Extremity Assessment Upper Extremity Assessment: Defer to OT evaluation    Lower Extremity Assessment Lower Extremity Assessment: Generalized weakness    Cervical / Trunk Assessment Cervical / Trunk Assessment: Normal  Communication   Communication Communication: No apparent difficulties    Cognition Arousal: Alert Behavior During Therapy: WFL for tasks assessed/performed   PT - Cognitive impairments: No apparent impairments                         Following commands: Intact       Cueing Cueing Techniques: Verbal cues     General Comments General comments (skin integrity, edema, etc.): HR up to 147 bpm with standing mobility, quickly recovered to 120s upon  sitting then reduced to 100s once resting supine, SpO2 stable on RA    Exercises     Assessment/Plan    PT Assessment Patient needs continued PT services  PT Problem List Decreased strength;Decreased activity tolerance;Decreased balance;Decreased mobility;Cardiopulmonary status limiting activity       PT Treatment Interventions DME instruction;Gait training;Functional mobility training;Therapeutic activities;Therapeutic exercise;Neuromuscular re-education;Balance training;Patient/family education;Stair training    PT Goals (Current goals can be found in the Care Plan section)  Acute Rehab PT Goals Patient Stated Goal: to improve PT Goal Formulation: With patient Time For Goal Achievement: 03/20/23 Potential to Achieve Goals: Good    Frequency Min 1X/week     Co-evaluation               AM-PAC PT "6 Clicks" Mobility  Outcome Measure Help needed turning from your back to your side while in a flat bed without using bedrails?: A Little Help needed moving from lying on your back to sitting on the side of a flat bed without using bedrails?: A Little Help needed moving to and from a bed to a chair (including a wheelchair)?: A Little Help needed standing up from a chair using your arms (e.g., wheelchair or bedside chair)?: A Little Help needed to walk in hospital room?:  Total (<20 ft) Help needed climbing 3-5 steps with a railing? : Total 6 Click Score: 14    End of Session Equipment Utilized During Treatment: Gait belt Activity Tolerance: Patient tolerated treatment well;Patient limited by fatigue Patient left: in bed;with call bell/phone within reach;with bed alarm set   PT Visit Diagnosis: Unsteadiness on feet (R26.81);Other abnormalities of gait and mobility (R26.89);Muscle weakness (generalized) (M62.81);Difficulty in walking, not elsewhere classified (R26.2)    Time: 1610-9604 PT Time Calculation (min) (ACUTE ONLY): 25 min   Charges:   PT Evaluation $PT Eval  Moderate Complexity: 1 Mod PT Treatments $Therapeutic Activity: 8-22 mins PT General Charges $$ ACUTE PT VISIT: 1 Visit         Virgil Benedict, PT, DPT Acute Rehabilitation Services  Office: [phone]   Bettina Gavia 03/06/2023, 3:53 PM

## 2023-03-06 NOTE — TOC Initial Note (Signed)
 Transition of Care [facility]) - Initial/Assessment Note    Patient Details  Name: Donna Sexton MRN: [id number] Date of Birth: Feb 01, 1950  [facility] of [facility]) CM/SW Contact:    Gala Lewandowsky, RN Phone Number: 03/06/2023, 4:16 PM  Clinical Narrative:  Patient presented for shortness of breath with atrial fib RVR. PTA patient states she was independent from home alone. Patient states she has support of neighbors and her former spouse. Patient has DME cane and rolling walker. Case Manager discussed recommendations from PT/OT and she wants to discuss plan with her former spouse before she makes a decision. CSW to offer patient SNF options. No further needs identified at this time. Case Manager will continue to follow.                  Expected Discharge Plan: Skilled Nursing Facility Barriers to Discharge: Continued Medical Work up   Patient Goals and CMS Choice Patient states their goals for this hospitalization and ongoing recovery are:: patient to speak with former spouse regarding home vs SNF.   Expected Discharge Plan and Services   Discharge Planning Services: CM Consult   Living arrangements for the past 2 months: Single Family Home      Prior Living Arrangements/Services Living arrangements for the past 2 months: Single Family Home Lives with:: Self Patient language and need for interpreter reviewed:: Yes Do you feel safe going back to the place where you live?: Yes      Need for Family Participation in Patient Care: Yes (Comment) Care giver support system in place?: Yes (comment) Current home services: DME (cane and rolling walker) Criminal Activity/Legal Involvement Pertinent to Current Situation/Hospitalization: No - Comment as needed  Activities of Daily Living   ADL Screening (condition at time of admission) Independently performs ADLs?: No Does the patient have a NEW difficulty with bathing/dressing/toileting/self-feeding that is expected to last >3  days?: No (needs assist) Does the patient have a NEW difficulty with getting in/out of bed, walking, or climbing stairs that is expected to last >3 days?: No (needs assist up with cane or walker) Does the patient have a NEW difficulty with communication that is expected to last >3 days?: No Is the patient deaf or have difficulty hearing?: No Does the patient have difficulty seeing, even when wearing glasses/contacts?: No Does the patient have difficulty concentrating, remembering, or making decisions?: No  Permission Sought/Granted Permission sought to share information with : Family Supports, Case Production designer, theatre/television/film, Other (comment) Transport planner)      Emotional Assessment Appearance:: Appears stated age Attitude/Demeanor/Rapport: Engaged Affect (typically observed): Appropriate Orientation: : Oriented to Self, Oriented to Place, Oriented to  Time, Oriented to Situation Alcohol / Substance Use: Not Applicable Psych Involvement: No (comment)  Admission diagnosis:  Ventricular tachycardia (HCC) [I47.20] V-tach (HCC) [I47.20] Atrial fibrillation with RVR (HCC) [I48.91] COVID-19 [U07.1] Patient Active Problem List   Diagnosis Date Noted   V-tach (HCC) 03/01/2023   Acute hypoxemic respiratory failure due to COVID-19 (HCC) 03/01/2023   Atrial fibrillation with rapid ventricular response (HCC) 03/01/2023   Senile purpura (HCC) 02/16/2023   Encounter for weight management 04/25/2022   Palpitations 02/04/2022   DDD (degenerative disc disease), cervical 02/05/2021   Chronic heart failure with preserved ejection fraction (HCC) 05/16/2020   Impaired glucose tolerance 10/10/2019   Morbid obesity (HCC) 10/09/2019   Acute pain of right shoulder 02/23/2018   GAD (generalized anxiety disorder) 12/20/2017   Trigger finger, left little finger 03/15/2017   Primary osteoarthritis of both knees 01/23/2017  Localized, primary osteoarthritis of hand, left 01/23/2017   Lumbar radiculitis 09/07/2015   IBS  (irritable bowel syndrome) 08/07/2015   Elevated LFTs 05/18/2015   Hyperglycemia 05/12/2015   Lumbago 11/05/2014   Basal cell papilloma 09/11/2014   Dyspnea 09/04/2014   Restless leg 04/16/2014   Muscle spasm 02/27/2014   Overactive bladder 04/09/2013   Abnormal mammogram 06/22/2012   Bipolar disorder (HCC) 09/29/2011   Hyperlipidemia 09/29/2011   Essential hypertension 09/29/2011   Telogen effluvium 08/16/2011   Calcium blood increased 01/11/2011   Anxiety state 10/04/2010   Clinical depression 10/04/2010   Anal bleeding 10/04/2010   Cannot sleep 10/04/2010   GERD 07/28/2009   COMMON MIGRAINE 12/29/2008   OSTEOPENIA 10/30/2007   PCP:  Agapito Games, MD Pharmacy:   [facility] [place], [place] - [address] [facility] [address] [place] [address] Phone: [phone] Fax: [phone]  Social Drivers of Health (SDOH) Social History: SDOH Screenings   Food Insecurity: No Food Insecurity (03/02/2023)  Recent Concern: Food Insecurity - Food Insecurity Present (02/13/2023)  Housing: Low Risk  (03/02/2023)  Transportation Needs: No Transportation Needs (03/02/2023)  Utilities: Not At Risk (03/02/2023)  Alcohol Screen: Low Risk  (03/28/2022)  Depression (PHQ2-9): Low Risk  (08/05/2022)  Financial Resource Strain: Low Risk  (02/13/2023)  Physical Activity: Insufficiently Active (02/13/2023)  Social Connections: Socially Isolated (03/02/2023)  Stress: Stress Concern Present (02/13/2023)  Tobacco Use: Low Risk  (03/02/2023)    Readmission Risk Interventions     No data to display

## 2023-03-07 DIAGNOSIS — F411 Generalized anxiety disorder: Secondary | ICD-10-CM | POA: Diagnosis not present

## 2023-03-07 DIAGNOSIS — U071 COVID-19: Secondary | ICD-10-CM | POA: Diagnosis not present

## 2023-03-07 DIAGNOSIS — I4891 Unspecified atrial fibrillation: Secondary | ICD-10-CM | POA: Diagnosis not present

## 2023-03-07 DIAGNOSIS — I5032 Chronic diastolic (congestive) heart failure: Secondary | ICD-10-CM | POA: Diagnosis not present

## 2023-03-07 MED ORDER — FUROSEMIDE 40 MG PO TABS
40.0000 mg | ORAL_TABLET | Freq: Two times a day (BID) | ORAL | Status: DC
  Administered 2023-03-07 – 2023-03-10 (×6): 40 mg via ORAL
  Filled 2023-03-07 (×6): qty 1

## 2023-03-07 NOTE — Plan of Care (Signed)
 Will continue to monitor patient.

## 2023-03-07 NOTE — Progress Notes (Signed)
 Physical Therapy Treatment Patient Details Name: Donna Sexton MRN: [id number] DOB: 1950-08-26 Today's Date: 03/07/2023   History of Present Illness Pt is a 73 yo female admitted after being found down at home by a neighbor. Pt with afib with RVR, +covid, UTI, sepsis with encephalopathy. CT of head and spine -.  PMH: obesity, bipolar d/o, CHF, HTN, DM.    PT Comments  Pt admitted with above diagnosis. Pt was only able to transfer bed to chair as her HR up to 160 bpm and O2 saturation on RA to 74% with standing.  Placed pt on 2LO2 to get sats > 90%.  HR back to 102 bpm with rest in chair therefore left pt sitting in chair. Pt performed some exercises in chair as well. Notified nurse and MD regarding pts incr HR and desaturation. Will continue to follow acutely.   Pt currently with functional limitations due to the deficits listed below (see PT Problem List). Pt will benefit from acute skilled PT to increase their independence and safety with mobility to allow discharge.       If plan is discharge home, recommend the following: A little help with walking and/or transfers;A little help with bathing/dressing/bathroom;Assistance with cooking/housework;Assist for transportation;Help with stairs or ramp for entrance   Can travel by private vehicle     Yes  Equipment Recommendations  BSC/3in1    Recommendations for Other Services       Precautions / Restrictions Precautions Precautions: Fall;Other (comment) (airborne precautions) Recall of Precautions/Restrictions: Intact Precaution/Restrictions Comments: airborne precautions; watch HR Restrictions Weight Bearing Restrictions Per Provider Order: No     Mobility  Bed Mobility Overal bed mobility: Needs Assistance Bed Mobility: Supine to Sit     Supine to sit: Min assist, HOB elevated, Used rails     General bed mobility comments: Pt needed minA at each foot to lift off bed. Pt utilized bed rails    Transfers Overall transfer  level: Needs assistance Equipment used: Rolling walker (2 wheels) Transfers: Sit to/from Stand, Bed to chair/wheelchair/BSC Sit to Stand: Min assist, +2 safety/equipment, From elevated surface   Step pivot transfers: Min assist, +2 physical assistance, +2 safety/equipment       General transfer comment: Pt pushed up from the bed to power up to stand, but needed extra time and minA +2 to power up to stand and gain balance. Pt maintained a lot of trunk flexion despite cues to push through arms to stand upright. Noted shakiness throughout body with extra effort needed to stand and step pivot to left from bed to recliner, minA for balance.  Could not even attempt ambulation due to HR to 160 bpm and desaturation to 74% on RA.  Placed 2LO2 on pt and sats up to 93% after 1 min and HR back to 102 bpm.    Ambulation/Gait                   Stairs             Wheelchair Mobility     Tilt Bed    Modified Rankin (Stroke Patients Only)       Balance Overall balance assessment: Needs assistance Sitting-balance support: Feet supported Sitting balance-Leahy Scale: Good     Standing balance support: Bilateral upper extremity supported, Reliant on assistive device for balance Standing balance-Leahy Scale: Poor Standing balance comment: Reliant on RW and minA, very flexed posture when standing today  Communication Communication Communication: No apparent difficulties  Cognition Arousal: Alert Behavior During Therapy: WFL for tasks assessed/performed   PT - Cognitive impairments: No apparent impairments                         Following commands: Intact      Cueing Cueing Techniques: Verbal cues  Exercises General Exercises - Lower Extremity Ankle Circles/Pumps: AROM, Both, 10 reps, Seated Quad Sets: AROM, Both, 10 reps, Seated Long Arc Quad: AROM, Both, 10 reps, Seated Hip Flexion/Marching: AROM, Both, 10 reps,  Seated    General Comments        Pertinent Vitals/Pain Pain Assessment Pain Assessment: No/denies pain    Home Living                          Prior Function            PT Goals (current goals can now be found in the care plan section) Acute Rehab PT Goals Patient Stated Goal: to improve Progress towards PT goals: Not progressing toward goals - comment (Incr HR)    Frequency    Min 1X/week      PT Plan      Co-evaluation              AM-PAC PT "6 Clicks" Mobility   Outcome Measure  Help needed turning from your back to your side while in a flat bed without using bedrails?: A Little Help needed moving from lying on your back to sitting on the side of a flat bed without using bedrails?: A Little Help needed moving to and from a bed to a chair (including a wheelchair)?: Total Help needed standing up from a chair using your arms (e.g., wheelchair or bedside chair)?: A Little Help needed to walk in hospital room?: Total (<20 ft) Help needed climbing 3-5 steps with a railing? : Total 6 Click Score: 12    End of Session Equipment Utilized During Treatment: Gait belt;Oxygen Activity Tolerance: Patient limited by fatigue Patient left: with call bell/phone within reach;in chair;with chair alarm set   PT Visit Diagnosis: Unsteadiness on feet (R26.81);Other abnormalities of gait and mobility (R26.89);Muscle weakness (generalized) (M62.81);Difficulty in walking, not elsewhere classified (R26.2)     Time: 4132-4401 PT Time Calculation (min) (ACUTE ONLY): 29 min  Charges:    $Therapeutic Exercise: 8-22 mins $Therapeutic Activity: 8-22 mins PT General Charges $$ ACUTE PT VISIT: 1 Visit                     Kairo Laubacher M,PT Acute Rehab Services [phone]    Bevelyn Buckles 03/07/2023, 12:41 PM

## 2023-03-07 NOTE — [place] FL2 (Signed)
 [place] MEDICAID FL2 LEVEL OF CARE FORM     IDENTIFICATION  Patient Name: Donna Sexton Birthdate: [birth year] Sex: female Admission Date (Current Location): 03/01/2023  [facility] and [place] Number:  Producer, television/film/video and Address:  [facility], [address], [place], [place] [address]      Provider Number: [id number]  Attending Physician Name and Address:  Almon Hercules, MD  Relative Name and Phone Number:       Current Level of Care: Hospital Recommended Level of Care: Skilled Nursing Facility Prior Approval Number:    Date Approved/Denied:   PASRR Number: PASSR under review  Discharge Plan: SNF    Current Diagnoses: Patient Active Problem List   Diagnosis Date Noted   V-tach [facility]) 03/01/2023   Acute hypoxemic respiratory failure due to COVID-19 [facility]) 03/01/2023   Atrial fibrillation with rapid ventricular response (HCC) 03/01/2023   Senile purpura (HCC) 02/16/2023   Encounter for weight management 04/25/2022   Palpitations 02/04/2022   DDD (degenerative disc disease), cervical 02/05/2021   Chronic heart failure with preserved ejection fraction (HCC) 05/16/2020   Impaired glucose tolerance 10/10/2019   Morbid obesity (HCC) 10/09/2019   Acute pain of right shoulder 02/23/2018   GAD (generalized anxiety disorder) 12/20/2017   Trigger finger, left little finger 03/15/2017   Primary osteoarthritis of both knees 01/23/2017   Localized, primary osteoarthritis of hand, left 01/23/2017   Lumbar radiculitis 09/07/2015   IBS (irritable bowel syndrome) 08/07/2015   Elevated LFTs 05/18/2015   Hyperglycemia 05/12/2015   Lumbago 11/05/2014   Basal cell papilloma 09/11/2014   Dyspnea 09/04/2014   Restless leg 04/16/2014   Muscle spasm 02/27/2014   Overactive bladder 04/09/2013   Abnormal mammogram 06/22/2012   Bipolar disorder (HCC) 09/29/2011   Hyperlipidemia 09/29/2011   Essential hypertension 09/29/2011   Telogen effluvium 08/16/2011    Calcium blood increased 01/11/2011   Anxiety state 10/04/2010   Clinical depression 10/04/2010   Anal bleeding 10/04/2010   Cannot sleep 10/04/2010   GERD 07/28/2009   COMMON MIGRAINE 12/29/2008   OSTEOPENIA 10/30/2007    Orientation RESPIRATION BLADDER Height & Weight     Self, Time, Situation, Place  O2 (Nasal Cannula 2 liters) Incontinent, External catheter (External Urinary Catheter) Weight: 249 lb 4.8 oz (113.1 kg) Height:  5\' 4"  (162.6 cm)  BEHAVIORAL SYMPTOMS/MOOD NEUROLOGICAL BOWEL NUTRITION STATUS      Continent Diet (Please see discharge summary)  AMBULATORY STATUS COMMUNICATION OF NEEDS Skin   Limited Assist Verbally Other (Comment) (blister,arm,chest,L,Ecchymosis,arm,buttocks,L,lower,upper,Wound/Incision LDAs,Wound/Incision open or dehisced skin tear,arm,anterior,L,lower,opened blister,Wound/Incision open or dehisced skin tear chest,lateral,L,upper,opened blister)                       Personal Care Assistance Level of Assistance  Bathing, Feeding, Dressing Bathing Assistance: Limited assistance Feeding assistance: Independent Dressing Assistance: Limited assistance     Functional Limitations Info  Sight, Hearing, Speech Sight Info:  (Reading Glasses) Hearing Info: Adequate Speech Info: Adequate    SPECIAL CARE FACTORS FREQUENCY  PT (By licensed PT), OT (By licensed OT)     PT Frequency: 5x min weekly OT Frequency: 5x min weekly            Contractures Contractures Info: Not present    Additional Factors Info  Code Status, Allergies, Psychotropic, Isolation Precautions Code Status Info: FULL Allergies Info: Morphine, Other-Allergic to muscle relaxers -unknown reaction Flexeril only can take cyclobenzaprine. Psychotropic Info: divalproex (DEPAKOTE ER) 24 hr tablet 1,500 mg  daily at bedtime,pregabalin [facility]) capsule 50 mg 3 times daily   Isolation Precautions Info: Covid +     Current Medications (03/07/2023):  This is the current hospital  active medication list Current Facility-Administered Medications  Medication Dose Route Frequency Provider Last Rate Last Admin   acetaminophen (TYLENOL) tablet 500 mg  500 mg Oral Q6H PRN Buena Irish, MD   500 mg at 03/05/23 1010   Or   acetaminophen (TYLENOL) suppository 650 mg  650 mg Rectal Q6H PRN Buena Irish, MD       amiodarone (PACERONE) tablet 400 mg  400 mg Oral BID [place], Peter M, MD   400 mg at 03/07/23 1056   apixaban (ELIQUIS) tablet 5 mg  5 mg Oral BID [place], Peter M, MD   5 mg at 03/07/23 1056   bisacodyl (DULCOLAX) EC tablet 5 mg  5 mg Oral Daily PRN Buena Irish, MD       diltiazem (CARDIZEM CD) 24 hr capsule 360 mg  360 mg Oral Daily [place], Peter M, MD   360 mg at 03/07/23 1056   divalproex (DEPAKOTE ER) 24 hr tablet 1,500 mg  1,500 mg Oral QHS Buena Irish, MD   1,500 mg at 03/06/23 2122   famotidine (PEPCID) tablet 20 mg  20 mg Oral Daily Buena Irish, MD   20 mg at 03/07/23 1056   ondansetron (ZOFRAN) tablet 4 mg  4 mg Oral Q6H PRN Buena Irish, MD       Or   ondansetron [facility]) injection 4 mg  4 mg Intravenous Q6H PRN Buena Irish, MD       pregabalin (LYRICA) capsule 50 mg  50 mg Oral TID Buena Irish, MD   50 mg at 03/07/23 1056     Discharge Medications: Please see discharge summary for a list of discharge medications.  Relevant Imaging Results:  Relevant Lab Results:   Additional Information [id number]  Delilah Shan, LCSWA

## 2023-03-07 NOTE — Progress Notes (Addendum)
 RE: Donna Sexton  Date of Birth: [birth year]  Date: 03/07/2023    To Whom It May Concern:   Please be advised that the above-named patient will require a short-term nursing home stay - anticipated 30 days or less for rehabilitation and strengthening. The plan is for return home.

## 2023-03-07 NOTE — Progress Notes (Signed)
 PROGRESS NOTE  Donna Sexton HQI:[id number] DOB: 12-Sep-1950   PCP: Agapito Games, MD  Patient is from: Home.  Lives alone.  Uses rollator for ambulation.  Drives.  DOA: 03/01/2023 LOS: 6  Chief complaints Chief Complaint  Patient presents with   Fall     Brief Narrative / Interim history: 73 year old F with PMH of diastolic CHF, DM-2, bipolar disorder, HTN, morbid obesity and HLD brought to ED after found down by family members.  She last spoke to her ex-husband 24 hours prior.  Patient was unable to recall the event.  In ED, febrile to 101.  In A-fib with RVR with heart rate 160s (new).  Normotensive.  CK9 106.  COVID-19 PCR positive.  UA with bacteriuria and pyuria.  Cultures obtained. She was admitted with A-fib with RVR, severe sepsis with encephalopathy and hypoxic respiratory failure due to COVID-19.  Initially started on Cardizem drip and later transitioned to IV amiodarone.  Also started on Decadron for COVID-19 infection and ceftriaxone for UTI.  Patient's RVR resolved.  Blood and urine cultures NGTD.  Encephalopathy resolved.  Completed 5 days of IV antibiotics for possible UTI on 2/23.  Cardiology gave recommendation and signed off.  PT/OT recommended SNF.  Subjective: Seen and examined earlier this morning.  No major events overnight of this morning.  No complaints.  Feels better today.  Objective: Vitals:   03/07/23 0516 03/07/23 1030 03/07/23 1056 03/07/23 1324  BP: (!) 144/92 (!) 135/122 (!) 135/122 (!) 145/95  Pulse:      Resp: 18 20  18   Temp: 97.8 F (36.6 C) 98.3 F (36.8 C)    TempSrc: Oral Oral    SpO2:  95%    Weight: 113.1 kg     Height:        Examination:  GENERAL: No apparent distress.  Nontoxic. HEENT: MMM.  Vision and hearing grossly intact.  NECK: Supple.  Difficult to assess JVD due to body habitus. RESP:  No IWOB.  Fair aeration bilaterally but limited exam due to body habitus. CVS:  RRR. Heart sounds normal.  ABD/GI/GU: BS+. Abd  soft, NTND.  Limited exam due to body habitus MSK/EXT:  Moves extremities. No apparent deformity. No edema.   SKIN: Intact and broken bulla on left arm.  Broken bulla/blister over left chest. NEURO: Awake, alert and oriented appropriately.  No apparent focal neuro deficit. PSYCH: Calm. Normal affect.   Procedures:  None  Microbiology summarized: COVID-19 PCR positive Influenza and RSV PCR nonreactive Blood cultures NGTD Urine culture NGTD   Assessment and plan: New onset atrial fibrillation with RVR: Provoked by sepsis/COVID?  Remains in A-fib but rate controlled.  TTE with LVEF of 70 to 75% but difficult to assess for DD or RWMA.  TSH normal.  Rate controlled at rest but HR jumps to 130s with minimal activity. -Appreciate cardiology recs  Amiodarone 400 mg twice daily until 03/09/2023, followed by 200 mg once daily after that Cardizem CD 360 mg once daily Eliquis 5 mg twice daily -Optimize electrolytes  Severe sepsis with septic encephalopathy due to COVID-19 infection: Present on arrival.  CT head without significant finding.  ABG, TSH, B12, Depakote level and ammonia within normal.  UA raised concern for UTI but urine cultures and blood cultures NGTD.  She has no focal neurodeficit.  Encephalopathy likely due to COVID-19 infection.  Now resolved.  She is awake and alert and oriented x 4. -Discontinued Decadron on 2/23. -Reorientation and delirium precaution -Continue isolation precaution per  protocol -PT/OT-recommended SNF  Acute respiratory failure with hypoxia ruled out.  COVID-19 PCR positive.  CXR with minimal basilar atelectasis without consolidation.  Inflammatory markers elevated. No documented desaturation but has been on supplemental oxygen.  Currently saturating in 90s on room air at rest but desaturated to 74% with exertion requiring 2 L to recover. -Wean oxygen to room air -Incentive spirometry, ambulation, OOB/PT/OT -She may need oxygen on discharge.   Chronic  diastolic CHF: TTE as above. She has 1+ BLE edema but difficult to assess fluid status due to body habitus.  Edema seems to have improved based on prior documentation.  Reports improvement in her respiratory status.  Intermittently diuresed with IV Lasix. -Continue holding diuretics. -Strict intake and output, daily weight, renal functions and electrolytes   AKI: Baseline Cr 0.6-0.7.  Likely ATN due to sepsis and rhabdo.  Resolved. Recent Labs    07/19/22 1131 03/01/23 1447 03/02/23 0430 03/03/23 0354 03/04/23 0341 03/05/23 0838 03/06/23 0421  BUN 8 18 16  25* 22 20 20   CREATININE 0.77 1.00 1.18* 0.96 0.73 0.62 0.71  -Avoid nephrotoxic meds -Continue monitoring  Anxiety, bipolar disorder: On significant dose of Depakote.  Denies recent change.  Depakote and ammonia level within normal. -Continue home Depakote and doxepin   Prediabetes: A1c 6.2%.  Never had A1c over 6.4%.  Not on medication at home.  Diabetes ruled out. -Monitor glucose with daily lab  Essential hypertension: Seems she is on labetalol and amlodipine at home.  BP slightly elevated. -Continue Cardizem as above.  Discontinue amlodipine on discharge -Monitor.  Fall at home/physical deconditioning: Lives alone and ambulates using rollator.  Still drives.  Unknown mechanism of fall but in A-fib with RVR on arrival.  Also septic and encephalopathic with COVID.  CT head and CT cervical spine without acute finding.  No focal neurodeficit.  She has broken an intact bulla on left arm.  Unclear cause. -Fall precaution -PT/OT evaluation pending  Bilateral hand paresthesia: CT cervical spine showed reversal of cervical lordosis with trace anterolisthesis C2 on C3, C3 on C4 and C4 on C5, likely degenerative.  Neuroexam intact.  B12 within normal. -Continue home Lyrica  Right orbital mass: Incidental 1.9 cm right inferomedial orbital mass.  -Radiology recommended  further evaluation with nonemergent orbital MRI with and without  contrast. -Will discuss with patient  Left forearm and left chest bula/blisters: Unclear cause for this.  Related to fall? -Continue wound care  Traumatic rhabdomyolysis: Mild.  Resolved.   Morbid obesity class III:  Body mass index is 42.79 kg/m. -Encourage lifestyle change to lose weight          DVT prophylaxis:   apixaban (ELIQUIS) tablet 5 mg  Code Status: Full code Family Communication: None at bedside Level of care: Telemetry Cardiac Status is: Inpatient Remains inpatient appropriate because: New onset A-fib with RVR, respiratory failure, severe sepsis, physical deconditioning   Final disposition: SNF Consultants:  Cardiology  55 minutes with more than 50% spent in reviewing records, counseling patient/family and coordinating care.   Sch Meds:  Scheduled Meds:  amiodarone  400 mg Oral BID   apixaban  5 mg Oral BID   diltiazem  360 mg Oral Daily   divalproex  1,500 mg Oral QHS   famotidine  20 mg Oral Daily   pregabalin  50 mg Oral TID   Continuous Infusions:   PRN Meds:.acetaminophen **OR** acetaminophen, bisacodyl, ondansetron **OR** ondansetron (ZOFRAN) IV  Antimicrobials: Anti-infectives (From admission, onward)    Start  Dose/Rate Route Frequency Ordered Stop   03/02/23 2200  remdesivir 100 mg in sodium chloride 0.9 % 100 mL IVPB  Status:  Discontinued       Placed in "Followed by" Linked Group   100 mg 200 mL/hr over 30 Minutes Intravenous Daily 03/01/23 2146 03/02/23 1013   03/02/23 1015  cefTRIAXone (ROCEPHIN) 1 g in sodium chloride 0.9 % 100 mL IVPB        1 g 200 mL/hr over 30 Minutes Intravenous Every 24 hours 03/02/23 1012 03/06/23 0847   03/01/23 2300  remdesivir 200 mg in sodium chloride 0.9% 250 mL IVPB       Placed in "Followed by" Linked Group   200 mg 580 mL/hr over 30 Minutes Intravenous Once 03/01/23 2146 03/02/23 0157   03/01/23 1530  ceFEPIme (MAXIPIME) 2 g in sodium chloride 0.9 % 100 mL IVPB        2 g 200 mL/hr over 30  Minutes Intravenous  Once 03/01/23 1522 03/01/23 1730   03/01/23 1530  metroNIDAZOLE (FLAGYL) IVPB 500 mg        500 mg 100 mL/hr over 60 Minutes Intravenous  Once 03/01/23 1522 03/01/23 1846   03/01/23 1530  vancomycin (VANCOCIN) IVPB 1000 mg/200 mL premix  Status:  Discontinued        1,000 mg 200 mL/hr over 60 Minutes Intravenous  Once 03/01/23 1522 03/01/23 1524   03/01/23 1530  vancomycin (VANCOREADY) IVPB 2000 mg/400 mL        2,000 mg 200 mL/hr over 120 Minutes Intravenous  Once 03/01/23 1525 03/01/23 2046        I have personally reviewed the following labs and images: CBC: Recent Labs  Lab 03/01/23 1447 03/02/23 0127 03/02/23 0430 03/03/23 0354 03/05/23 0838 03/06/23 0421  WBC 10.2  --  8.3 5.8 5.8 8.1  NEUTROABS 6.3  --   --   --   --   --   HGB 14.0 10.5* 11.8* 12.5 13.6 12.7  HCT 40.8 31.0* 35.0* 35.6* 37.8 36.7  MCV 97.1  --  97.5 95.2 94.3 97.3  PLT 146*  --  128* 141* 177 154   BMP &GFR Recent Labs  Lab 03/02/23 0430 03/03/23 0354 03/04/23 0341 03/05/23 0838 03/06/23 0421  NA 134* 134* 134* 133* 133*  K 3.7 4.0 4.2 4.6 4.8  CL 101 99 97* 102 100  CO2 19* 24 22 21* 19*  GLUCOSE 186* 172* 177* 176* 159*  BUN 16 25* 22 20 20   CREATININE 1.18* 0.96 0.73 0.62 0.71  CALCIUM 8.2* 8.6* 9.4 8.8* 8.8*  MG 1.9  --   --  1.9 1.8  PHOS  --   --   --  2.7  --    Estimated Creatinine Clearance: 77.2 mL/min (by C-G formula based on SCr of 0.71 mg/dL). Liver & Pancreas: Recent Labs  Lab 03/01/23 1447 03/02/23 0430 03/03/23 0354 03/05/23 0838 03/06/23 0421  AST 69* 60* 45*  --  24  ALT 32 25 25  --  26  ALKPHOS 47 42 38  --  42  BILITOT 2.3* 1.4* 0.6  --  0.5  PROT 5.9* 5.0* 5.6*  --  5.1*  ALBUMIN 3.2* 2.6* 2.8* 3.0* 2.7*   No results for input(s): "LIPASE", "AMYLASE" in the last 168 hours. Recent Labs  Lab 03/05/23 0838  AMMONIA 32   Diabetic: No results for input(s): "HGBA1C" in the last 72 hours. No results for input(s): "GLUCAP" in the last  168 hours.  Cardiac Enzymes: Recent Labs  Lab 03/01/23 1447 03/06/23 0421  CKTOTAL 905* 49   No results for input(s): "PROBNP" in the last 8760 hours. Coagulation Profile: Recent Labs  Lab 03/01/23 1447  INR 1.1   Thyroid Function Tests: No results for input(s): "TSH", "T4TOTAL", "FREET4", "T3FREE", "THYROIDAB" in the last 72 hours. Lipid Profile: No results for input(s): "CHOL", "HDL", "LDLCALC", "TRIG", "CHOLHDL", "LDLDIRECT" in the last 72 hours. Anemia Panel: Recent Labs    03/05/23 0403 03/05/23 0410  VITAMINB12 631  --   FERRITIN  --  309*   Urine analysis:    Component Value Date/Time   COLORURINE YELLOW 03/02/2023 1500   APPEARANCEUR HAZY (A) 03/02/2023 1500   LABSPEC 1.028 03/02/2023 1500   PHURINE 5.0 03/02/2023 1500   GLUCOSEU NEGATIVE 03/02/2023 1500   GLUCOSEU NEGATIVE 10/23/2009 1116   HGBUR MODERATE (A) 03/02/2023 1500   BILIRUBINUR NEGATIVE 03/02/2023 1500   BILIRUBINUR large (A) 02/16/2023 1014   BILIRUBINUR neg 07/05/2016 1113   KETONESUR 5 (A) 03/02/2023 1500   PROTEINUR 30 (A) 03/02/2023 1500   UROBILINOGEN >=8.0 (A) 02/16/2023 1014   UROBILINOGEN 1.0 02/01/2010 1926   NITRITE NEGATIVE 03/02/2023 1500   LEUKOCYTESUR SMALL (A) 03/02/2023 1500   Sepsis Labs: Invalid input(s): "PROCALCITONIN", "LACTICIDVEN"  Microbiology: Recent Results (from the past 240 hours)  Blood Culture (routine x 2)     Status: None   Collection Time: 03/01/23  4:01 PM   Specimen: BLOOD RIGHT ARM  Result Value Ref Range Status   Specimen Description BLOOD RIGHT ARM  Final   Special Requests   Final    BOTTLES DRAWN AEROBIC ONLY Blood Culture results may not be optimal due to an inadequate volume of blood received in culture bottles   Culture   Final    NO GROWTH 5 DAYS Performed at [facility], [address], [place], [place] [address]    Report Status 03/06/2023 FINAL  Final  Blood Culture (routine x 2)     Status: None   Collection Time: 03/01/23  4:40  PM   Specimen: BLOOD RIGHT ARM  Result Value Ref Range Status   Specimen Description BLOOD RIGHT ARM  Final   Special Requests   Final    BOTTLES DRAWN AEROBIC AND ANAEROBIC Blood Culture adequate volume   Culture   Final    NO GROWTH 5 DAYS Performed at [facility], [address], [place], [place] [address]    Report Status 03/06/2023 FINAL  Final  Resp panel by RT-PCR (RSV, Flu A&B, Covid) Anterior Nasal Swab     Status: Abnormal   Collection Time: 03/01/23  4:48 PM   Specimen: Anterior Nasal Swab  Result Value Ref Range Status   SARS Coronavirus 2 by RT PCR POSITIVE (A) NEGATIVE Final   Influenza A by PCR NEGATIVE NEGATIVE Final   Influenza B by PCR NEGATIVE NEGATIVE Final    Comment: (NOTE) The Xpert Xpress SARS-CoV-2/FLU/RSV plus assay is intended as an aid in the diagnosis of influenza from Nasopharyngeal swab specimens and should not be used as a sole basis for treatment. Nasal washings and aspirates are unacceptable for Xpert Xpress SARS-CoV-2/FLU/RSV testing.  Fact Sheet for Patients: BloggerCourse.com  Fact Sheet for Healthcare Providers: SeriousBroker.it  This test is not yet approved or cleared by the [place] FDA and has been authorized for detection and/or diagnosis of SARS-CoV-2 by FDA under an Emergency Use Authorization (EUA). This EUA will remain in effect (meaning this test can  be used) for the duration of the COVID-19 declaration under Section 564(b)(1) of the Act, 21 U.S.C. section 360bbb-3(b)(1), unless the authorization is terminated or revoked.     Resp Syncytial Virus by PCR NEGATIVE NEGATIVE Final    Comment: (NOTE) Fact Sheet for Patients: BloggerCourse.com  Fact Sheet for Healthcare Providers: SeriousBroker.it  This test is not yet approved or cleared by the [place] FDA and has been authorized for detection and/or diagnosis  of SARS-CoV-2 by FDA under an Emergency Use Authorization (EUA). This EUA will remain in effect (meaning this test can be used) for the duration of the COVID-19 declaration under Section 564(b)(1) of the Act, 21 U.S.C. section 360bbb-3(b)(1), unless the authorization is terminated or revoked.  Performed at [facility], [address], [place], [place] [address]   Urine Culture     Status: None   Collection Time: 03/02/23  3:00 PM   Specimen: Urine, Random  Result Value Ref Range Status   Specimen Description URINE, RANDOM  Final   Special Requests NONE Reflexed from [id number]  Final   Culture   Final    NO GROWTH Performed at [facility], [address], [place], [place] [address]    Report Status 03/03/2023 FINAL  Final    Radiology Studies: No results found.    Alvera Tourigny T. Deni Lefever [facility]  If 7PM-7AM, please contact night-coverage [email or web address] 03/07/2023, 2:07 PM

## 2023-03-07 NOTE — TOC Progression Note (Addendum)
 Transition of Care [facility]) - Progression Note    Patient Details  Name: Donna Sexton MRN: [id number] Date of Birth: November 05, 1950  [facility] CM/SW Contact  Delilah Shan, LCSWA Phone Number: 03/07/2023, 11:26 AM  Clinical Narrative:     CSW received consult for possible SNF placement at time of discharge. CSW spoke with patient regarding PT recommendation of SNF placement at time of discharge. Patient reports PTA she comes from home alone.Patient expressed understanding of PT recommendation and is agreeable to SNF placement at time of discharge. Patient gave CSW permission to fax out initial referral.CSW discussed insurance authorization process with patient.No further questions reported at this time. Patients passr pending.CSW to continue to follow and assist with discharge planning needs.   CSW provided SNF bed offers to patient. Patient would like to review SNF bed offers and discuss with former spouse. CSW will follow back up with patient on SNF choice.  Update- CSW submitted requested clinicals to [facility] for review. Patients passr currently pending.  Expected Discharge Plan: (P) Skilled Nursing Facility Barriers to Discharge: (P) Continued Medical Work up  Expected Discharge Plan and Services   Discharge Planning Services: CM Consult   Living arrangements for the past 2 months: Single Family Home                                       Social Determinants of Health (SDOH) Interventions SDOH Screenings   Food Insecurity: No Food Insecurity (03/02/2023)  Recent Concern: Food Insecurity - Food Insecurity Present (02/13/2023)  Housing: Low Risk  (03/02/2023)  Transportation Needs: No Transportation Needs (03/02/2023)  Utilities: Not At Risk (03/02/2023)  Alcohol Screen: Low Risk  (03/28/2022)  Depression (PHQ2-9): Low Risk  (08/05/2022)  Financial Resource Strain: Low Risk  (02/13/2023)  Physical Activity: Insufficiently Active (02/13/2023)  Social Connections:  Socially Isolated (03/02/2023)  Stress: Stress Concern Present (02/13/2023)  Tobacco Use: Low Risk  (03/02/2023)    Readmission Risk Interventions     No data to display

## 2023-03-08 DIAGNOSIS — I4891 Unspecified atrial fibrillation: Secondary | ICD-10-CM | POA: Diagnosis not present

## 2023-03-08 DIAGNOSIS — J9601 Acute respiratory failure with hypoxia: Secondary | ICD-10-CM | POA: Diagnosis not present

## 2023-03-08 DIAGNOSIS — I472 Ventricular tachycardia, unspecified: Secondary | ICD-10-CM | POA: Diagnosis not present

## 2023-03-08 DIAGNOSIS — U071 COVID-19: Secondary | ICD-10-CM | POA: Diagnosis not present

## 2023-03-08 LAB — CBC
HCT: 33.6 % — ABNORMAL LOW (ref 36.0–46.0)
Hemoglobin: 11.7 g/dL — ABNORMAL LOW (ref 12.0–15.0)
MCH: 33.2 pg (ref 26.0–34.0)
MCHC: 34.8 g/dL (ref 30.0–36.0)
MCV: 95.5 fL (ref 80.0–100.0)
Platelets: 280 10*3/uL (ref 150–400)
RBC: 3.52 MIL/uL — ABNORMAL LOW (ref 3.87–5.11)
RDW: 13.1 % (ref 11.5–15.5)
WBC: 8.1 10*3/uL (ref 4.0–10.5)
nRBC: 0.2 % (ref 0.0–0.2)

## 2023-03-08 LAB — RENAL FUNCTION PANEL
Albumin: 2.6 g/dL — ABNORMAL LOW (ref 3.5–5.0)
Anion gap: 8 (ref 5–15)
BUN: 14 mg/dL (ref 8–23)
CO2: 29 mmol/L (ref 22–32)
Calcium: 8.7 mg/dL — ABNORMAL LOW (ref 8.9–10.3)
Chloride: 96 mmol/L — ABNORMAL LOW (ref 98–111)
Creatinine, Ser: 0.72 mg/dL (ref 0.44–1.00)
GFR, Estimated: >60 mL/min (ref >60)
Glucose, Bld: 107 mg/dL — ABNORMAL HIGH (ref 70–99)
Phosphorus: 4.3 mg/dL (ref 2.5–4.6)
Potassium: 4.4 mmol/L (ref 3.5–5.1)
Sodium: 133 mmol/L — ABNORMAL LOW (ref 135–145)

## 2023-03-08 LAB — MAGNESIUM: Magnesium: 1.9 mg/dL (ref 1.7–2.4)

## 2023-03-08 MED ORDER — POLYETHYLENE GLYCOL 3350 17 G PO PACK
17.0000 g | PACK | Freq: Two times a day (BID) | ORAL | Status: DC
  Administered 2023-03-09 – 2023-03-10 (×2): 17 g via ORAL
  Filled 2023-03-08 (×4): qty 1

## 2023-03-08 MED ORDER — LEVALBUTEROL HCL 0.63 MG/3ML IN NEBU
0.6300 mg | INHALATION_SOLUTION | Freq: Four times a day (QID) | RESPIRATORY_TRACT | Status: DC
  Administered 2023-03-08 – 2023-03-09 (×3): 0.63 mg via RESPIRATORY_TRACT
  Filled 2023-03-08 (×3): qty 3

## 2023-03-08 MED ORDER — HYDROXYZINE HCL 25 MG PO TABS
25.0000 mg | ORAL_TABLET | Freq: Three times a day (TID) | ORAL | Status: DC | PRN
  Administered 2023-03-08 – 2023-03-10 (×5): 25 mg via ORAL
  Filled 2023-03-08 (×5): qty 1

## 2023-03-08 NOTE — Progress Notes (Signed)
 Mobility Specialist Progress Note;   03/08/23 0905  Mobility  Activity Transferred from bed to chair  Level of Assistance Minimal assist, patient does 75% or more  Assistive Device Other (Comment) (HHA)  Distance Ambulated (ft) 3 ft  Activity Response Tolerated well  Mobility Referral Yes  Mobility visit 1 Mobility  Mobility Specialist Start Time (ACUTE ONLY) [id number]  Mobility Specialist Stop Time (ACUTE ONLY) 0920  Mobility Specialist Time Calculation (min) (ACUTE ONLY) 15 min   Pt agreeable to mobility. Required MinA to stand from elevated bed and safely transfer to chair. SPO2 desat to 84% on RA upon standing, however quickly recovered to 90%>. HR up to 133 w/ activity. Pt left in chair with all needs met, alarm on.   Caesar Bookman Mobility Specialist Please contact via SecureChat or [employer] [phone]

## 2023-03-08 NOTE — Progress Notes (Addendum)
 [facility]                                                                              Judah Chevere, is a 73 y.o. female, DOB - 11/08/1950, ZOX:[id number] Admit date - 03/01/2023    Outpatient Primary MD for the patient is Agapito Games, MD  LOS - 7  days  Chief Complaint  Patient presents with   Fall       Brief summary   Patient is a 73 year old female with diastolic CHF, DM-2, bipolar disorder, HTN, morbid obesity and HLD brought to ED after found down by family members.  She last spoke to her ex-husband 24 hours prior.  Patient was unable to recall the event.  In ED, febrile to 101.  In A-fib with RVR with heart rate 160s (new).  Normotensive.  CK9 106.  COVID-19 PCR positive.  UA with bacteriuria and pyuria.  Cultures obtained. She was admitted with A-fib with RVR, severe sepsis with encephalopathy and hypoxic respiratory failure due to COVID-19.  Initially started on Cardizem drip and later transitioned to IV amiodarone.  Also started on Decadron for COVID-19 infection and ceftriaxone for UTI.   Patient's RVR resolved.  Blood and urine cultures NGTD.  Encephalopathy resolved.  Completed 5 days of IV antibiotics for possible UTI on 2/23.  Cardiology has signed off.    Assessment & Plan    Principal Problem:   Acute hypoxemic respiratory failure due to COVID-19 (HCC) -In ED, required O2 3 L via  -Currently on room air, O2 sats 93 to 91%, -Continue incentive spirometry, ambulation, PT OT -Tested positive on 03/01/2023, per SNF policy, needs 10-day isolation Slight wheezing noted, will place on Xopenex.  New onset atrial fibrillation with RVR:  - Provoked by sepsis/COVID?  -2D echo showed EF of 70 to 75%, TSH normal -Cardiology consulted, recommended Amiodarone 400 mg twice daily until 03/09/2023, followed by 200 mg once daily after that, Eliquis 5 mg twice daily, Cardizem CD 360 mg daily    Severe sepsis with septic encephalopathy due to COVID-19  infection: POA  -CT head with no acute findings. -ABG, TSH, B12, Depakote level, and ammonia level normal -UA concerning for UTI, urine culture, blood cultures NTD, no FND's -Encephalopathy likely due to COVID-19 infection. -Currently alert and oriented x 4 -Decadron discontinued on 2/23.     Chronic diastolic CHF:  -2D echo showed EF of 70 send 5%, TSH normal.   -Intermittently diuresed with IV Lasix, for now continue Lasix 40 mg p.o. twice daily  -Strict I's and O's and daily weights, negative balance of 1.9 L    AKI with lactic acidosis - Baseline Cr 0.6-0.7.  Likely ATN due to sepsis and rhabdo.  Resolved. -Creatinine plateaued at 1.18, improved   Anxiety, bipolar disorder: - On significant dose of Depakote.  Denies recent change.  Depakote and ammonia level within normal. -Continue home Depakote and doxepin   Prediabetes:  Hemoglobin A1c 6.2 on 02/16/2023  -Continue to monitor  Essential hypertension -Continue Cardizem.  DC amlodipine on discharge.   Fall at home/physical deconditioning: -  Lives alone and ambulates using rollator.  Still drives.   - Unknown mechanism of fall but in A-fib with RVR on arrival.  Also septic and encephalopathic with COVID.  -CT head and CT cervical spine without acute finding.  -No new FND's.  -Fall precaution -PT recommending SNF   Bilateral hand paresthesia:  -CT cervical spine showed reversal of cervical lordosis with trace anterolisthesis C2 on C3, C3 on C4 and C4 on C5, likely degenerative.  Neuroexam intact.  B12 within normal. -Continue home Lyrica   Right orbital mass: Incidental 1.9 cm right inferomedial orbital mass.  -Radiology recommended  further evaluation with nonemergent orbital MRI with and without contrast.   Left forearm bula/blisters: Unclear cause for this.  Likely from the fall -Continue wound care   Traumatic rhabdomyolysis: Mild.  Resolved.   Morbid obesity class III:  Estimated body mass index is 42.52 kg/m  as calculated from the following:   Height as of this encounter: 5\' 4"  (1.626 m).   Weight as of this encounter: 112.4 kg.  Code Status: Full code DVT Prophylaxis:   apixaban (ELIQUIS) tablet 5 mg   Level of Care: Level of care: Telemetry Cardiac Family Communication: Updated patient Disposition Plan:      Remains inpatient appropriate:  -Tested positive on 03/01/2023, per SNF policy, needs 10-day isolation, likely DC on 2/28   Procedures:    Consultants:     Antimicrobials:   Anti-infectives (From admission, onward)    Start     Dose/Rate Route Frequency Ordered Stop   03/02/23 2200  remdesivir 100 mg in sodium chloride 0.9 % 100 mL IVPB  Status:  Discontinued       Placed in "Followed by" Linked Group   100 mg 200 mL/hr over 30 Minutes Intravenous Daily 03/01/23 2146 03/02/23 1013   03/02/23 1015  cefTRIAXone (ROCEPHIN) 1 g in sodium chloride 0.9 % 100 mL IVPB        1 g 200 mL/hr over 30 Minutes Intravenous Every 24 hours 03/02/23 1012 03/06/23 0847   03/01/23 2300  remdesivir 200 mg in sodium chloride 0.9% 250 mL IVPB       Placed in "Followed by" Linked Group   200 mg 580 mL/hr over 30 Minutes Intravenous Once 03/01/23 2146 03/02/23 0157   03/01/23 1530  ceFEPIme (MAXIPIME) 2 g in sodium chloride 0.9 % 100 mL IVPB        2 g 200 mL/hr over 30 Minutes Intravenous  Once 03/01/23 1522 03/01/23 1730   03/01/23 1530  metroNIDAZOLE (FLAGYL) IVPB 500 mg        500 mg 100 mL/hr over 60 Minutes Intravenous  Once 03/01/23 1522 03/01/23 1846   03/01/23 1530  vancomycin (VANCOCIN) IVPB 1000 mg/200 mL premix  Status:  Discontinued        1,000 mg 200 mL/hr over 60 Minutes Intravenous  Once 03/01/23 1522 03/01/23 1524   03/01/23 1530  vancomycin (VANCOREADY) IVPB 2000 mg/400 mL        2,000 mg 200 mL/hr over 120 Minutes Intravenous  Once 03/01/23 1525 03/01/23 2046          Medications  amiodarone  400 mg Oral BID   apixaban  5 mg Oral BID   diltiazem  360 mg Oral Daily    divalproex  1,500 mg Oral QHS   famotidine  20 mg Oral Daily   furosemide  40 mg Oral BID   pregabalin  50 mg Oral TID      Subjective:   Grant Henkes was seen  and examined today.  No acute complaints, sitting up in the chair, talking on the phone.  Patient denies dizziness, chest pain, shortness of breath, abdominal pain, N/V/D/C. No acute events overnight.  Slight wheezing noted  Objective:   Vitals:   03/07/23 1324 03/07/23 1500 03/07/23 1944 03/08/23 0539  BP: (!) 145/95  (!) 140/94 131/76  Pulse: 100 88 90 94  Resp: 18  18 20   Temp:   97.8 F (36.6 C) 97.9 F (36.6 C)  TempSrc:   Oral Oral  SpO2: 99% 95% 94% 95%  Weight:    112.4 kg  Height:        Intake/Output Summary (Last 24 hours) at 03/08/2023 1231 Last data filed at 03/08/2023 1221 Gross per 24 hour  Intake 480 ml  Output 2750 ml  Net -2270 ml     Wt Readings from Last 3 Encounters:  03/08/23 112.4 kg  02/16/23 113.9 kg  11/16/22 113.4 kg     Exam General: Alert and oriented x 3, NAD Cardiovascular: S1 S2 auscultated,  RRR Respiratory: Mild b/l expiratory wheezing Gastrointestinal: Soft, nontender, nondistended, + bowel sounds Ext: no pedal edema bilaterally Neuro: no new deficits Psych: Normal affect     Data Reviewed:  I have personally reviewed following labs    CBC Lab Results  Component Value Date   WBC 8.1 03/08/2023   RBC 3.52 (L) 03/08/2023   HGB 11.7 (L) 03/08/2023   HCT 33.6 (L) 03/08/2023   MCV 95.5 03/08/2023   MCH 33.2 03/08/2023   PLT 280 03/08/2023   MCHC 34.8 03/08/2023   RDW 13.1 03/08/2023   LYMPHSABS 1.2 03/01/2023   MONOABS 2.4 (H) 03/01/2023   EOSABS 0.0 03/01/2023   BASOSABS 0.1 03/01/2023     Last metabolic panel Lab Results  Component Value Date   NA 133 (L) 03/08/2023   K 4.4 03/08/2023   CL 96 (L) 03/08/2023   CO2 29 03/08/2023   BUN 14 03/08/2023   CREATININE 0.72 03/08/2023   GLUCOSE 107 (H) 03/08/2023   GFRNONAA >60 03/08/2023   GFRAA  106 08/18/2017   CALCIUM 8.7 (L) 03/08/2023   PHOS 4.3 03/08/2023   PROT 5.1 (L) 03/06/2023   ALBUMIN 2.6 (L) 03/08/2023   BILITOT 0.5 03/06/2023   ALKPHOS 42 03/06/2023   AST 24 03/06/2023   ALT 26 03/06/2023   ANIONGAP 8 03/08/2023    CBG (last 3)  No results for input(s): "GLUCAP" in the last 72 hours.    Coagulation Profile: Recent Labs  Lab 03/01/23 1447  INR 1.1     Radiology Studies: I have personally reviewed the imaging studies  No results found.     Thad Ranger M.D. [facility] 03/08/2023, 12:31 PM  Available via Epic secure chat 7am-7pm After 7 pm, please refer to night coverage provider listed on amion.

## 2023-03-08 NOTE — Progress Notes (Signed)
 Occupational Therapy Treatment Patient Details Name: Donna Sexton MRN: [id number] DOB: [birth year] Today's Date: 03/08/2023   History of present illness Pt is a 73 yo female admitted after being found down at home by a neighbor. Pt with afib with RVR, +covid, UTI, sepsis with encephalopathy. CT of head and spine -.  PMH: obesity, bipolar d/o, CHF, HTN, DM.   OT comments  Pt in recliner upon therapy arrival and reports purewick not in correct position and pt feeling wet. OT session focused on standing tolerance, activity tolerance, endurance and compensatory breathing techniques. Pt required increase VC during sit<>stand transition and functional transfer for body posture and breathing technique. Pt demonstrated increased anxiety and effort when completing all standing tasks. Patient will benefit from continued inpatient follow up therapy, <3 hours/day. OT will continue to follow patient acutely.        If plan is discharge home, recommend the following:  A lot of help with bathing/dressing/bathroom;Assistance with cooking/housework;Assist for transportation;A lot of help with walking and/or transfers   Equipment Recommendations  Other (comment) (defer to next level of care)       Precautions / Restrictions Precautions Precautions: Fall;Other (comment) Recall of Precautions/Restrictions: Intact Precaution/Restrictions Comments: airborne/contact precautions; watch HR Restrictions Weight Bearing Restrictions Per Provider Order: No       Mobility Bed Mobility Overal bed mobility: Needs Assistance Bed Mobility: Sit to Supine       Sit to supine: Supervision, Used rails   General bed mobility comments: Pt able to utilized bed rails on both sides to boost self up in bed with only VC for technique.    Transfers Overall transfer level: Needs assistance Equipment used: Rolling walker (2 wheels) Transfers: Sit to/from Stand, Bed to chair/wheelchair/BSC Sit to Stand: Mod assist      Step pivot transfers: Contact guard assist     General transfer comment: VC for hand placement prior to sit to stand transition with RW management. VC provided for direction and sequencing. pt completed 2 sit<>stand transitions during session and 1 step pivot transfer from recliner to bed.     Balance Overall balance assessment: Needs assistance Sitting-balance support: Feet supported Sitting balance-Leahy Scale: Good     Standing balance support: Bilateral upper extremity supported, Reliant on assistive device for balance, During functional activity Standing balance-Leahy Scale: Poor Standing balance comment: Reliant on RW and minA, very flexed posture when standing today requiring VC to push into RW with BUE in order to bring trunk upright.     ADL either performed or assessed with clinical judgement   ADL        Toileting- Clothing Manipulation and Hygiene: Sit to/from stand;Total assistance Toileting - Clothing Manipulation Details (indicate cue type and reason): Provided increased assistance for hygiene due to poor purewick placement and urine leakage.       General ADL Comments: Increased respirations when up standing with RW with HR increasing to 130's.              Communication Communication Communication: No apparent difficulties   Cognition Arousal: Alert Behavior During Therapy: WFL for tasks assessed/performed Cognition: No apparent impairments   Following commands: Intact        Cueing   Cueing Techniques: Verbal cues        General Comments HR increased to 130's when standing and completing functional transfer. Pt educated on compensatory breathing technique once seated on EOB to help decrease respirations and HR.    Pertinent Vitals/ Pain  Pain Assessment Pain Assessment: Faces Faces Pain Scale: No hurt         Frequency  Min 1X/week        Progress Toward Goals  OT Goals(current goals can now be found in the care plan  section)  Progress towards OT goals: Progressing toward goals            AM-PAC OT "6 Clicks" Daily Activity     Outcome Measure   Help from another person eating meals?: None Help from another person taking care of personal grooming?: A Little Help from another person toileting, which includes using toliet, bedpan, or urinal?: Total Help from another person bathing (including washing, rinsing, drying)?: Total Help from another person to put on and taking off regular upper body clothing?: A Little Help from another person to put on and taking off regular lower body clothing?: Total 6 Click Score: 13    End of Session Equipment Utilized During Treatment: Rolling walker (2 wheels)  OT Visit Diagnosis: Unsteadiness on feet (R26.81);History of falling (Z91.81);Muscle weakness (generalized) (M62.81)   Activity Tolerance Patient tolerated treatment well   Patient Left in bed;with call bell/phone within reach;with bed alarm set   Nurse Communication Mobility status;Other (comment) (need for new purewick and hospital gown)        Time: 1914-7829 OT Time Calculation (min): 25 min  Charges: OT General Charges $OT Visit: 1 Visit OT Treatments $Self Care/Home Management : 8-22 mins $Therapeutic Activity: 8-22 mins  Limmie Patricia, OTR/L,CBIS  Supplemental OT - [facility] and [facility] Secure Chat Preferred    Sherby Moncayo, Charisse March 03/08/2023, 12:20 PM

## 2023-03-08 NOTE — Plan of Care (Signed)
 Will continue to monitor.

## 2023-03-08 NOTE — TOC Progression Note (Addendum)
 Transition of Care [facility]) - Progression Note    Patient Details  Name: Donna Sexton MRN: [id number] Date of Birth: [birth year]  Transition of [facility] CM/SW Contact  Delilah Shan, LCSWA Phone Number: 03/08/2023, 9:49 AM  Clinical Narrative:     CSW spoke with patient regarding SNF bed offers. Patient informed CSW she hasn't had a chance to speak with family regarding SNF choice. [facility] inquired about [name]. CSW informed patient she will reach out to [name] to see if they can make SNF bed offer. CSW spoke with Soy with [name] who is going to review patients referral and will follow up with CSW on if they can make SNF bed offer. Patients passr currently pending. CSW will continue to follow.  Update-Patients passr has been approved [id number].  Update- CSW spoke with Soy with [name] who informed CSW she can offer SNF bed to patient. CSW spoke with patient and informed patient. Patient accepted SNF bed offer with [name]. Soy with facility informed CSW that patient will need 10 full days quarantine before can accept patient. Soy to call CSW back to confirm date they can accept patient once medically ready. CSW will start insurance authorization. CSW will continue to follow.  Update- Soy with [name] informed CSW that they can accept patient on the 28h pending insurance auth and if medically stable. CSW informed MD.  Update- CSW started insurance authorization for patient Reference # [id number]. Insurance authorization currently pending.   Expected Discharge Plan: (P) Skilled Nursing Facility Barriers to Discharge: (P) Continued Medical Work up  Expected Discharge Plan and Services   Discharge Planning Services: CM Consult   Living arrangements for the past 2 months: Single Family Home                                       Social Determinants of Health (SDOH) Interventions SDOH Screenings   Food Insecurity: No Food Insecurity  (03/02/2023)  Recent Concern: Food Insecurity - Food Insecurity Present (02/13/2023)  Housing: Low Risk  (03/02/2023)  Transportation Needs: No Transportation Needs (03/02/2023)  Utilities: Not At Risk (03/02/2023)  Alcohol Screen: Low Risk  (03/28/2022)  Depression (PHQ2-9): Low Risk  (08/05/2022)  Financial Resource Strain: Low Risk  (02/13/2023)  Physical Activity: Insufficiently Active (02/13/2023)  Social Connections: Socially Isolated (03/02/2023)  Stress: Stress Concern Present (02/13/2023)  Tobacco Use: Low Risk  (03/02/2023)    Readmission Risk Interventions     No data to display

## 2023-03-09 ENCOUNTER — Ambulatory Visit: Payer: Medicare Other | Admitting: Family Medicine

## 2023-03-09 DIAGNOSIS — I4891 Unspecified atrial fibrillation: Secondary | ICD-10-CM | POA: Diagnosis not present

## 2023-03-09 DIAGNOSIS — I472 Ventricular tachycardia, unspecified: Secondary | ICD-10-CM | POA: Diagnosis not present

## 2023-03-09 DIAGNOSIS — J9601 Acute respiratory failure with hypoxia: Secondary | ICD-10-CM | POA: Diagnosis not present

## 2023-03-09 DIAGNOSIS — E7800 Pure hypercholesterolemia, unspecified: Secondary | ICD-10-CM | POA: Insufficient documentation

## 2023-03-09 DIAGNOSIS — U071 COVID-19: Secondary | ICD-10-CM | POA: Diagnosis not present

## 2023-03-09 DIAGNOSIS — N390 Urinary tract infection, site not specified: Secondary | ICD-10-CM | POA: Insufficient documentation

## 2023-03-09 DIAGNOSIS — R7303 Prediabetes: Secondary | ICD-10-CM | POA: Diagnosis present

## 2023-03-09 MED ORDER — LEVALBUTEROL HCL 0.63 MG/3ML IN NEBU
0.6300 mg | INHALATION_SOLUTION | Freq: Four times a day (QID) | RESPIRATORY_TRACT | Status: DC | PRN
  Administered 2023-03-09: 0.63 mg via RESPIRATORY_TRACT
  Filled 2023-03-09: qty 3

## 2023-03-09 MED ORDER — MOMETASONE FURO-FORMOTEROL FUM 100-5 MCG/ACT IN AERO
2.0000 | INHALATION_SPRAY | Freq: Two times a day (BID) | RESPIRATORY_TRACT | Status: DC
  Administered 2023-03-09 – 2023-03-10 (×2): 2 via RESPIRATORY_TRACT
  Filled 2023-03-09: qty 8.8

## 2023-03-09 MED ORDER — DIPHENHYDRAMINE HCL 50 MG/ML IJ SOLN
12.5000 mg | Freq: Once | INTRAMUSCULAR | Status: AC
  Administered 2023-03-09: 12.5 mg via INTRAVENOUS
  Filled 2023-03-09: qty 1

## 2023-03-09 MED ORDER — AMIODARONE HCL 200 MG PO TABS
400.0000 mg | ORAL_TABLET | Freq: Two times a day (BID) | ORAL | Status: AC
  Administered 2023-03-09: 400 mg via ORAL
  Filled 2023-03-09: qty 2

## 2023-03-09 MED ORDER — AMIODARONE HCL 200 MG PO TABS
200.0000 mg | ORAL_TABLET | Freq: Every day | ORAL | Status: DC
  Administered 2023-03-10: 200 mg via ORAL
  Filled 2023-03-09: qty 1

## 2023-03-09 NOTE — Plan of Care (Signed)
 Will continue to monitor patient.

## 2023-03-09 NOTE — Progress Notes (Signed)
 Mobility Specialist Progress Note:    03/09/23 1414  Mobility  Activity Transferred from bed to chair  Level of Assistance Moderate assist, patient does 50-74% (+2)  Assistive Device Front wheel walker  Distance Ambulated (ft) 3 ft  Activity Response Tolerated well  Mobility Referral Yes  Mobility visit 1 Mobility  Mobility Specialist Start Time (ACUTE ONLY) 1145  Mobility Specialist Stop Time (ACUTE ONLY) 1200  Mobility Specialist Time Calculation (min) (ACUTE ONLY) 15 min   Pt received in bed agreeable to mobility. Pt needed no physical assistance getting to EOB. Attempted to stand pt w/ just MS. Pt very shaking unable to stand w/ one person assist. Nursing student assisted getting pt to chair w/ ModA +2. Situated in chair w/ call bell and personal belongings in reach. All needs met.   Thompson Grayer Mobility Specialist  Please contact vis Secure Chat or  Rehab Office [phone]

## 2023-03-09 NOTE — Progress Notes (Signed)
 Physical Therapy Treatment Patient Details Name: Donna Sexton MRN: [id number] DOB: [birth year] Today's Date: 03/09/2023   History of Present Illness Pt is a 73 yo female admitted after being found down at home by a neighbor. Pt with afib with RVR, +covid, UTI, sepsis with encephalopathy. CT of head and spine -.  PMH: obesity, bipolar d/o, CHF, HTN, DM.    PT Comments  Pt demonstrated and expressed anxiety with standing mobility, reporting a fear of falling. She reports she feels more comfortable utilizing a rollator due to it having brakes rather than utilizing a RW. Will plan to try a rollator in a future session. Focused session on trying to increase pt's lower extremity strength through serial sit <> stand reps and on trying to improve pt's independence and activity tolerance with functional mobility. She was able to perform bed mobility with CGA utilizing the bed features, but she needs minA for transfers and repeated anterior <> posterior steps at EOB with RW support. Her HR elevated with gait as her anxiety increased. Her HR went up to high 140s, but quickly recovered with seated rest break. She remains at high risk for falls. Will continue to follow acutely.    If plan is discharge home, recommend the following: A little help with bathing/dressing/bathroom;Assistance with cooking/housework;Assist for transportation;Help with stairs or ramp for entrance;A lot of help with walking and/or transfers   Can travel by private vehicle     Yes  Equipment Recommendations  BSC/3in1    Recommendations for Other Services       Precautions / Restrictions Precautions Precautions: Fall;Other (comment) (airborne precautions) Recall of Precautions/Restrictions: Intact Precaution/Restrictions Comments: airborne precautions; watch HR Restrictions Weight Bearing Restrictions Per Provider Order: No     Mobility  Bed Mobility Overal bed mobility: Needs Assistance Bed Mobility: Sit to Supine,  Supine to Sit     Supine to sit: Contact guard, HOB elevated, Used rails Sit to supine: HOB elevated, Used rails, Contact guard assist   General bed mobility comments: Extra time needed to manage trunk up to sit L EOB and to lift legs back onto bed with return to supine. Pt utilizing bed rails with all bed mobility. CGA for safety    Transfers Overall transfer level: Needs assistance Equipment used: Rolling walker (2 wheels), 1 person hand held assist Transfers: Sit to/from Stand Sit to Stand: Min assist           General transfer comment: Pt needed repeated verbal and tactile cues for proper hand placement with transfers. Pt transferred sit <> stand from EOB total of 7x, x5 serial reps pushing up from bed, x1 rep with RW, and x1 rep with HHA on therapist anterior to her    Ambulation/Gait Ambulation/Gait assistance: Min assist Gait Distance (Feet): 5 Feet Assistive device: Rolling walker (2 wheels) Gait Pattern/deviations: Step-to pattern, Decreased step length - right, Decreased step length - left, Decreased stride length, Trunk flexed, Shuffle, Decreased dorsiflexion - right, Decreased dorsiflexion - left Gait velocity: reduced Gait velocity interpretation: <1.31 ft/sec, indicative of household ambulator   General Gait Details: Pt maintains a flexed posture, heavily relying on her UEs on the RW for support. Pt only able to tolerate taking a couple small, shuffling steps anterior <> posterior repeatedly at EOB, fearful of ambulating further away from EOB. Distance also limited by her HR elevating. MinA for balance   Stairs             Wheelchair Mobility     Tilt Bed  Modified Rankin (Stroke Patients Only)       Balance Overall balance assessment: Needs assistance Sitting-balance support: Feet supported Sitting balance-Leahy Scale: Good     Standing balance support: Bilateral upper extremity supported, Reliant on assistive device for balance Standing  balance-Leahy Scale: Poor Standing balance comment: Reliant on RW and minA                            Communication Communication Communication: No apparent difficulties  Cognition Arousal: Alert Behavior During Therapy: Anxious   PT - Cognitive impairments: No apparent impairments                       PT - Cognition Comments: Pt expressed and displayed anxiety with standing and ambulating, reporting fear of falling Following commands: Intact      Cueing Cueing Techniques: Verbal cues, Tactile cues  Exercises Other Exercises Other Exercises: sit <> stand x5 serial reps, minA, using bil UEs on bed to push up to stand    General Comments General comments (skin integrity, edema, etc.): HR up to 120s with serial sit <> stand reps, but went up to high 140s as her anxiety increased with repeated anterior <> posterior stepping at EOB, quickly recovered to 100s with seated rest break      Pertinent Vitals/Pain Pain Assessment Pain Assessment: Faces Faces Pain Scale: Hurts a little bit Pain Location: generalized with mobility Pain Descriptors / Indicators: Discomfort, Grimacing Pain Intervention(s): Limited activity within patient's tolerance, Monitored during session, Repositioned    Home Living                          Prior Function            PT Goals (current goals can now be found in the care plan section) Acute Rehab PT Goals Patient Stated Goal: to improve PT Goal Formulation: With patient Time For Goal Achievement: 03/20/23 Potential to Achieve Goals: Good Progress towards PT goals: Progressing toward goals (slowly)    Frequency    Min 1X/week      PT Plan      Co-evaluation              AM-PAC PT "6 Clicks" Mobility   Outcome Measure  Help needed turning from your back to your side while in a flat bed without using bedrails?: A Little Help needed moving from lying on your back to sitting on the side of a flat bed  without using bedrails?: A Little Help needed moving to and from a bed to a chair (including a wheelchair)?: A Little Help needed standing up from a chair using your arms (e.g., wheelchair or bedside chair)?: A Little Help needed to walk in hospital room?: Total (<20 ft) Help needed climbing 3-5 steps with a railing? : Total 6 Click Score: 14    End of Session Equipment Utilized During Treatment: Gait belt Activity Tolerance: Patient tolerated treatment well;Patient limited by fatigue;Other (comment) (limited by elevated HR) Patient left: in bed;with call bell/phone within reach;with bed alarm set Nurse Communication: Other (comment) (pt asking about breathing treatment) PT Visit Diagnosis: Unsteadiness on feet (R26.81);Other abnormalities of gait and mobility (R26.89);Muscle weakness (generalized) (M62.81);Difficulty in walking, not elsewhere classified (R26.2)     Time: 6578-4696 PT Time Calculation (min) (ACUTE ONLY): 28 min  Charges:    $Therapeutic Exercise: 8-22 mins $Therapeutic Activity: 8-22 mins PT General Charges $$  ACUTE PT VISIT: 1 Visit                     Virgil Benedict, PT, DPT Acute Rehabilitation Services  Office: [phone]    Bettina Gavia 03/09/2023, 5:46 PM

## 2023-03-09 NOTE — Progress Notes (Signed)
 [facility]                                                                              Donna Sexton, is a 73 y.o. female, DOB - 29-Mar-1950, WUJ:[id number] Admit date - 03/01/2023    Outpatient Primary MD for the patient is Agapito Games, MD  LOS - 8  days  Chief Complaint  Patient presents with   Fall       Brief summary   Patient is a 73 year old female with diastolic CHF, DM-2, bipolar disorder, HTN, morbid obesity and HLD brought to ED after found down by family members.  She last spoke to her ex-husband 24 hours prior.  Patient was unable to recall the event.  In ED, febrile to 101.  In A-fib with RVR with heart rate 160s (new).  Normotensive.  CK9 106.  COVID-19 PCR positive.  UA with bacteriuria and pyuria.  Cultures obtained. She was admitted with A-fib with RVR, severe sepsis with encephalopathy and hypoxic respiratory failure due to COVID-19.  Initially started on Cardizem drip and later transitioned to IV amiodarone.  Also started on Decadron for COVID-19 infection and ceftriaxone for UTI.   Patient's RVR resolved.  Blood and urine cultures NGTD.  Encephalopathy resolved.  Completed 5 days of IV antibiotics for possible UTI on 2/23.  Cardiology has signed off.    Assessment & Plan    Principal Problem:   Acute hypoxemic respiratory failure due to COVID-19 (HCC) -In ED, required O2 3 L via [place] -Currently on room air -Continue incentive spirometry, ambulation, PT OT -Tested positive on 03/01/2023, per SNF policy, needs 10-day isolation -Continue Xopenex, added Dulera  New onset atrial fibrillation with RVR:  - Provoked by sepsis/COVID?  -2D echo showed EF of 70 to 75%, TSH normal -Cardiology consulted, recommended Amiodarone 400 mg twice daily until 03/09/2023, followed by 200 mg once daily after that, Eliquis 5 mg twice daily, Cardizem CD 360 mg daily -Will start amiodarone 200 mg daily tomorrow    Severe sepsis with septic encephalopathy due  to COVID-19 infection: POA  -CT head with no acute findings. -ABG, TSH, B12, Depakote level, and ammonia level normal -UA concerning for UTI, urine culture, blood cultures NTD, no FND's -Encephalopathy likely due to COVID-19 infection-resolved . -Currently alert and oriented x 4 -Decadron discontinued on 2/23.     Chronic diastolic CHF:  -2D echo showed EF of 70 send 5%, TSH normal.   -Intermittently diuresed with IV Lasix, for now continue Lasix 40 mg p.o. twice daily  -Negative balance of 2.4 L    AKI with lactic acidosis - Baseline Cr 0.6-0.7.  Likely ATN due to sepsis and rhabdo.  Resolved. -Creatinine plateaued at 1.18, improved   Anxiety, bipolar disorder: - On significant dose of Depakote.  Denies recent change.  Depakote and ammonia level within normal. -Continue home Depakote and doxepin   Prediabetes:  Hemoglobin A1c 6.2 on 02/16/2023  -Continue to monitor  Essential hypertension -Continue Cardizem.  DC amlodipine on discharge.   Fall at home/physical deconditioning: -  Lives alone and ambulates using rollator.  Still drives.   - Unknown mechanism  of fall but in A-fib with RVR on arrival.  Also septic and encephalopathic with COVID.  -CT head and CT cervical spine without acute finding.  -No new FND's.  -Fall precaution -PT recommending SNF   Bilateral hand paresthesia:  -CT cervical spine showed reversal of cervical lordosis with trace anterolisthesis C2 on C3, C3 on C4 and C4 on C5, likely degenerative.  Neuroexam intact.  B12 within normal. -Continue home Lyrica   Right orbital mass: Incidental 1.9 cm right inferomedial orbital mass.  -Radiology recommended  further evaluation with nonemergent orbital MRI with and without contrast.   Left forearm bula/blisters: Unclear cause for this.  Likely from the fall -Continue wound care   Traumatic rhabdomyolysis: Mild.  Resolved.   Morbid obesity class III:  Estimated body mass index is 43.82 kg/m as calculated  from the following:   Height as of this encounter: 5\' 4"  (1.626 m).   Weight as of this encounter: 115.8 kg.  Code Status: Full code DVT Prophylaxis:   apixaban (ELIQUIS) tablet 5 mg   Level of Care: Level of care: Telemetry Cardiac Family Communication: Updated patient Disposition Plan:      Remains inpatient appropriate:  -Tested positive on 03/01/2023, per SNF policy, needs 10-day isolation, likely DC on 2/28   Procedures:    Consultants:     Antimicrobials:   Anti-infectives (From admission, onward)    Start     Dose/Rate Route Frequency Ordered Stop   03/02/23 2200  remdesivir 100 mg in sodium chloride 0.9 % 100 mL IVPB  Status:  Discontinued       Placed in "Followed by" Linked Group   100 mg 200 mL/hr over 30 Minutes Intravenous Daily 03/01/23 2146 03/02/23 1013   03/02/23 1015  cefTRIAXone (ROCEPHIN) 1 g in sodium chloride 0.9 % 100 mL IVPB        1 g 200 mL/hr over 30 Minutes Intravenous Every 24 hours 03/02/23 1012 03/06/23 0847   03/01/23 2300  remdesivir 200 mg in sodium chloride 0.9% 250 mL IVPB       Placed in "Followed by" Linked Group   200 mg 580 mL/hr over 30 Minutes Intravenous Once 03/01/23 2146 03/02/23 0157   03/01/23 1530  ceFEPIme (MAXIPIME) 2 g in sodium chloride 0.9 % 100 mL IVPB        2 g 200 mL/hr over 30 Minutes Intravenous  Once 03/01/23 1522 03/01/23 1730   03/01/23 1530  metroNIDAZOLE (FLAGYL) IVPB 500 mg        500 mg 100 mL/hr over 60 Minutes Intravenous  Once 03/01/23 1522 03/01/23 1846   03/01/23 1530  vancomycin (VANCOCIN) IVPB 1000 mg/200 mL premix  Status:  Discontinued        1,000 mg 200 mL/hr over 60 Minutes Intravenous  Once 03/01/23 1522 03/01/23 1524   03/01/23 1530  vancomycin (VANCOREADY) IVPB 2000 mg/400 mL        2,000 mg 200 mL/hr over 120 Minutes Intravenous  Once 03/01/23 1525 03/01/23 2046          Medications  amiodarone  400 mg Oral BID   apixaban  5 mg Oral BID   diltiazem  360 mg Oral Daily   divalproex   1,500 mg Oral QHS   famotidine  20 mg Oral Daily   furosemide  40 mg Oral BID   polyethylene glycol  17 g Oral BID   pregabalin  50 mg Oral TID      Subjective:   Donna Sexton  was seen and examined today.  No acute complaints.  Still has some wheezing bilaterally.  No acute nausea vomiting, chest pain or acute shortness of breath.  No fevers.  Objective:   Vitals:   03/08/23 1450 03/08/23 2002 03/08/23 2200 03/09/23 0418  BP: 113/74 (!) 131/99  120/70  Pulse: 87 97 95 96  Resp: 20 18  20   Temp: 98 F (36.7 C) 97.8 F (36.6 C)  (!) 97.5 F (36.4 C)  TempSrc: Oral Oral  Oral  SpO2: 96% 100% 96% 96%  Weight:    115.8 kg  Height:        Intake/Output Summary (Last 24 hours) at 03/09/2023 1302 Last data filed at 03/09/2023 0941 Gross per 24 hour  Intake 837 ml  Output 1350 ml  Net -513 ml     Wt Readings from Last 3 Encounters:  03/09/23 115.8 kg  02/16/23 113.9 kg  11/16/22 113.4 kg   Physical Exam General: Alert and oriented x 3, NAD Cardiovascular: S1 S2 clear, RRR.  Respiratory: Mild bilateral scattered wheezing Gastrointestinal: Soft, nontender, nondistended, NBS Ext: no pedal edema bilaterally Neuro: no new deficits Psych: Normal affect    Data Reviewed:  I have personally reviewed following labs    CBC Lab Results  Component Value Date   WBC 8.1 03/08/2023   RBC 3.52 (L) 03/08/2023   HGB 11.7 (L) 03/08/2023   HCT 33.6 (L) 03/08/2023   MCV 95.5 03/08/2023   MCH 33.2 03/08/2023   PLT 280 03/08/2023   MCHC 34.8 03/08/2023   RDW 13.1 03/08/2023   LYMPHSABS 1.2 03/01/2023   MONOABS 2.4 (H) 03/01/2023   EOSABS 0.0 03/01/2023   BASOSABS 0.1 03/01/2023     Last metabolic panel Lab Results  Component Value Date   NA 133 (L) 03/08/2023   K 4.4 03/08/2023   CL 96 (L) 03/08/2023   CO2 29 03/08/2023   BUN 14 03/08/2023   CREATININE 0.72 03/08/2023   GLUCOSE 107 (H) 03/08/2023   GFRNONAA >60 03/08/2023   GFRAA 106 08/18/2017   CALCIUM 8.7  (L) 03/08/2023   PHOS 4.3 03/08/2023   PROT 5.1 (L) 03/06/2023   ALBUMIN 2.6 (L) 03/08/2023   BILITOT 0.5 03/06/2023   ALKPHOS 42 03/06/2023   AST 24 03/06/2023   ALT 26 03/06/2023   ANIONGAP 8 03/08/2023    CBG (last 3)  No results for input(s): "GLUCAP" in the last 72 hours.    Coagulation Profile: No results for input(s): "INR", "PROTIME" in the last 168 hours.    Radiology Studies: I have personally reviewed the imaging studies  No results found.     Thad Ranger M.D. [facility] 03/09/2023, 1:02 PM  Available via Epic secure chat 7am-7pm After 7 pm, please refer to night coverage provider listed on amion.

## 2023-03-09 NOTE — TOC Progression Note (Signed)
 Transition of Care [facility]) - Progression Note    Patient Details  Name: Donna Sexton MRN: [id number] Date of Birth: 07-Feb-1950  [facility] of [facility]) CM/SW Contact  Delilah Shan, LCSWA Phone Number: 03/09/2023, 10:13 AM  Clinical Narrative:     Patients insurance authorization has been approved from 2/28-3/4 reference# [id number]. Patient has SNF bed at [facility] tomorrow if medically ready. CSW will continue to follow.  Expected Discharge Plan: (P) Skilled Nursing Facility Barriers to Discharge: (P) Continued Medical Work up  Expected Discharge Plan and Services   Discharge Planning Services: CM Consult   Living arrangements for the past 2 months: Single Family Home                                       Social Determinants of Health (SDOH) Interventions SDOH Screenings   Food Insecurity: No Food Insecurity (03/02/2023)  Recent Concern: Food Insecurity - Food Insecurity Present (02/13/2023)  Housing: Low Risk  (03/02/2023)  Transportation Needs: No Transportation Needs (03/02/2023)  Utilities: Not At Risk (03/02/2023)  Alcohol Screen: Low Risk  (03/28/2022)  Depression (PHQ2-9): Low Risk  (08/05/2022)  Financial Resource Strain: Low Risk  (02/13/2023)  Physical Activity: Insufficiently Active (02/13/2023)  Social Connections: Socially Isolated (03/02/2023)  Stress: Stress Concern Present (02/13/2023)  Tobacco Use: Low Risk  (03/02/2023)    Readmission Risk Interventions     No data to display

## 2023-03-10 DIAGNOSIS — I480 Paroxysmal atrial fibrillation: Secondary | ICD-10-CM | POA: Diagnosis not present

## 2023-03-10 DIAGNOSIS — N179 Acute kidney failure, unspecified: Secondary | ICD-10-CM | POA: Diagnosis not present

## 2023-03-10 DIAGNOSIS — K5909 Other constipation: Secondary | ICD-10-CM | POA: Diagnosis not present

## 2023-03-10 DIAGNOSIS — R112 Nausea with vomiting, unspecified: Secondary | ICD-10-CM | POA: Diagnosis not present

## 2023-03-10 DIAGNOSIS — K5900 Constipation, unspecified: Secondary | ICD-10-CM | POA: Insufficient documentation

## 2023-03-10 DIAGNOSIS — R002 Palpitations: Secondary | ICD-10-CM | POA: Diagnosis not present

## 2023-03-10 DIAGNOSIS — Z7401 Bed confinement status: Secondary | ICD-10-CM | POA: Diagnosis not present

## 2023-03-10 DIAGNOSIS — Z299 Encounter for prophylactic measures, unspecified: Secondary | ICD-10-CM | POA: Diagnosis not present

## 2023-03-10 DIAGNOSIS — Z28311 Partially vaccinated for covid-19: Secondary | ICD-10-CM | POA: Diagnosis not present

## 2023-03-10 DIAGNOSIS — I5030 Unspecified diastolic (congestive) heart failure: Secondary | ICD-10-CM | POA: Diagnosis not present

## 2023-03-10 DIAGNOSIS — D6869 Other thrombophilia: Secondary | ICD-10-CM | POA: Diagnosis not present

## 2023-03-10 DIAGNOSIS — U071 COVID-19: Secondary | ICD-10-CM | POA: Diagnosis not present

## 2023-03-10 DIAGNOSIS — M6281 Muscle weakness (generalized): Secondary | ICD-10-CM | POA: Diagnosis not present

## 2023-03-10 DIAGNOSIS — R52 Pain, unspecified: Secondary | ICD-10-CM | POA: Diagnosis not present

## 2023-03-10 DIAGNOSIS — I472 Ventricular tachycardia, unspecified: Secondary | ICD-10-CM | POA: Diagnosis not present

## 2023-03-10 DIAGNOSIS — I4891 Unspecified atrial fibrillation: Secondary | ICD-10-CM | POA: Diagnosis not present

## 2023-03-10 DIAGNOSIS — Z6841 Body Mass Index (BMI) 40.0 and over, adult: Secondary | ICD-10-CM | POA: Diagnosis not present

## 2023-03-10 DIAGNOSIS — E7800 Pure hypercholesterolemia, unspecified: Secondary | ICD-10-CM | POA: Diagnosis not present

## 2023-03-10 DIAGNOSIS — Z8616 Personal history of COVID-19: Secondary | ICD-10-CM | POA: Diagnosis not present

## 2023-03-10 DIAGNOSIS — M6250 Muscle wasting and atrophy, not elsewhere classified, unspecified site: Secondary | ICD-10-CM | POA: Diagnosis not present

## 2023-03-10 DIAGNOSIS — Z7901 Long term (current) use of anticoagulants: Secondary | ICD-10-CM | POA: Diagnosis not present

## 2023-03-10 DIAGNOSIS — Z9181 History of falling: Secondary | ICD-10-CM | POA: Diagnosis not present

## 2023-03-10 DIAGNOSIS — R531 Weakness: Secondary | ICD-10-CM | POA: Diagnosis not present

## 2023-03-10 DIAGNOSIS — J9601 Acute respiratory failure with hypoxia: Secondary | ICD-10-CM | POA: Diagnosis not present

## 2023-03-10 DIAGNOSIS — R000 Tachycardia, unspecified: Secondary | ICD-10-CM | POA: Diagnosis not present

## 2023-03-10 DIAGNOSIS — F5105 Insomnia due to other mental disorder: Secondary | ICD-10-CM | POA: Diagnosis not present

## 2023-03-10 DIAGNOSIS — E785 Hyperlipidemia, unspecified: Secondary | ICD-10-CM | POA: Diagnosis not present

## 2023-03-10 DIAGNOSIS — I10 Essential (primary) hypertension: Secondary | ICD-10-CM | POA: Diagnosis not present

## 2023-03-10 DIAGNOSIS — R7303 Prediabetes: Secondary | ICD-10-CM | POA: Diagnosis not present

## 2023-03-10 DIAGNOSIS — Z5181 Encounter for therapeutic drug level monitoring: Secondary | ICD-10-CM | POA: Diagnosis not present

## 2023-03-10 DIAGNOSIS — R600 Localized edema: Secondary | ICD-10-CM | POA: Diagnosis not present

## 2023-03-10 DIAGNOSIS — R2689 Other abnormalities of gait and mobility: Secondary | ICD-10-CM | POA: Diagnosis not present

## 2023-03-10 DIAGNOSIS — I110 Hypertensive heart disease with heart failure: Secondary | ICD-10-CM | POA: Diagnosis not present

## 2023-03-10 DIAGNOSIS — D3161 Benign neoplasm of unspecified site of right orbit: Secondary | ICD-10-CM | POA: Diagnosis not present

## 2023-03-10 DIAGNOSIS — I5032 Chronic diastolic (congestive) heart failure: Secondary | ICD-10-CM | POA: Diagnosis not present

## 2023-03-10 DIAGNOSIS — R062 Wheezing: Secondary | ICD-10-CM | POA: Diagnosis not present

## 2023-03-10 DIAGNOSIS — I4819 Other persistent atrial fibrillation: Secondary | ICD-10-CM | POA: Diagnosis not present

## 2023-03-10 DIAGNOSIS — D692 Other nonthrombocytopenic purpura: Secondary | ICD-10-CM | POA: Diagnosis not present

## 2023-03-10 DIAGNOSIS — R5381 Other malaise: Secondary | ICD-10-CM | POA: Diagnosis not present

## 2023-03-10 DIAGNOSIS — Z79899 Other long term (current) drug therapy: Secondary | ICD-10-CM | POA: Diagnosis not present

## 2023-03-10 DIAGNOSIS — I4901 Ventricular fibrillation: Secondary | ICD-10-CM | POA: Diagnosis not present

## 2023-03-10 DIAGNOSIS — K219 Gastro-esophageal reflux disease without esophagitis: Secondary | ICD-10-CM | POA: Diagnosis not present

## 2023-03-10 DIAGNOSIS — Z87440 Personal history of urinary (tract) infections: Secondary | ICD-10-CM | POA: Diagnosis not present

## 2023-03-10 DIAGNOSIS — F3130 Bipolar disorder, current episode depressed, mild or moderate severity, unspecified: Secondary | ICD-10-CM | POA: Diagnosis not present

## 2023-03-10 DIAGNOSIS — A419 Sepsis, unspecified organism: Secondary | ICD-10-CM | POA: Diagnosis not present

## 2023-03-10 DIAGNOSIS — Z23 Encounter for immunization: Secondary | ICD-10-CM | POA: Diagnosis not present

## 2023-03-10 DIAGNOSIS — I491 Atrial premature depolarization: Secondary | ICD-10-CM | POA: Diagnosis not present

## 2023-03-10 MED ORDER — SENNOSIDES-DOCUSATE SODIUM 8.6-50 MG PO TABS: 2.0000 | ORAL_TABLET | Freq: Every day | ORAL | Status: DC

## 2023-03-10 MED ORDER — ACETAMINOPHEN 500 MG PO TABS: 500.0000 mg | ORAL_TABLET | Freq: Four times a day (QID) | ORAL | Status: DC | PRN

## 2023-03-10 MED ORDER — POLYETHYLENE GLYCOL 3350 17 G PO PACK: 17.0000 g | PACK | Freq: Every day | ORAL | Status: DC

## 2023-03-10 MED ORDER — LEVALBUTEROL HCL 0.63 MG/3ML IN NEBU: 0.6300 mg | INHALATION_SOLUTION | Freq: Four times a day (QID) | RESPIRATORY_TRACT | Status: DC | PRN

## 2023-03-10 MED ORDER — APIXABAN 5 MG PO TABS: 5.0000 mg | ORAL_TABLET | Freq: Two times a day (BID) | ORAL | Status: DC

## 2023-03-10 MED ORDER — AMIODARONE HCL 200 MG PO TABS: 200.0000 mg | ORAL_TABLET | Freq: Every day | ORAL | Status: DC

## 2023-03-10 MED ORDER — HYDROXYZINE HCL 25 MG PO TABS: 25.0000 mg | ORAL_TABLET | Freq: Three times a day (TID) | ORAL | Status: DC | PRN

## 2023-03-10 MED ORDER — DILTIAZEM HCL ER COATED BEADS 360 MG PO CP24
360.0000 mg | ORAL_CAPSULE | Freq: Every day | ORAL | Status: DC
Start: 2023-03-11 — End: 2023-03-21

## 2023-03-10 MED ORDER — MOMETASONE FURO-FORMOTEROL FUM 100-5 MCG/ACT IN AERO: 2.0000 | INHALATION_SPRAY | Freq: Two times a day (BID) | RESPIRATORY_TRACT | Status: DC

## 2023-03-10 MED ORDER — FUROSEMIDE 40 MG PO TABS: 40.0000 mg | ORAL_TABLET | Freq: Two times a day (BID) | ORAL | Status: DC

## 2023-03-10 NOTE — Care Management Important Message (Signed)
 Important Message  Patient Details  Name: Donna Sexton MRN: [id number] Date of Birth: September 11, 1950   Important Message Given:        Renie Ora 03/10/2023, 12:32 PM

## 2023-03-10 NOTE — Plan of Care (Signed)
  Problem: Respiratory: Goal: Will maintain a patent airway Outcome: Progressing Goal: Complications related to the disease process, condition or treatment will be avoided or minimized Outcome: Progressing   Problem: Clinical Measurements: Goal: Respiratory complications will improve Outcome: Progressing Goal: Cardiovascular complication will be avoided Outcome: Progressing   Problem: Activity: Goal: Risk for activity intolerance will decrease Outcome: Progressing   Problem: Nutrition: Goal: Adequate nutrition will be maintained Outcome: Progressing   Problem: Safety: Goal: Ability to remain free from injury will improve Outcome: Progressing   Problem: Activity: Goal: Ability to tolerate increased activity will improve Outcome: Progressing   Problem: Cardiac: Goal: Ability to achieve and maintain adequate cardiopulmonary perfusion will improve Outcome: Progressing

## 2023-03-10 NOTE — Discharge Summary (Signed)
 Physician Discharge Summary   Patient: Donna Sexton MRN: [id number] DOB: 28-Mar-1950  Admit date:     03/01/2023  Discharge date: 03/10/23  Discharge Physician: Thad Ranger, MD    PCP: Agapito Games, MD   Recommendations at discharge:   Started amiodarone 200 mg daily Fall precautions BMET in 1 week Right orbital mass: Incidental 1.9 cm right inferomedial orbital mass. Radiology recommended  further evaluation with nonemergent orbital MRI with and without contrast.  Discharge Diagnoses:    Acute hypoxemic respiratory failure due to COVID-19 [facility]) New onset atrial fibrillation with RVR Severe sepsis with septic encephalopathy due to COVID-19-resolved AKI with lactic acidosis   GAD (generalized anxiety disorder)   Chronic heart failure with preserved ejection fraction (HCC) Fall at home/generalized debility Bilateral hand paresthesias Traumatic rhabdomyolysis-resolved Obesity class III    Hospital Course:  Patient is a 73 year old female with diastolic CHF, DM-2, bipolar disorder, HTN, morbid obesity and HLD brought to ED after found down by family members.  She last spoke to her ex-husband 24 hours prior.  Patient was unable to recall the event.  In ED, febrile to 101.  In A-fib with RVR with heart rate 160s (new).  Normotensive.  CK9 106.  COVID-19 PCR positive.  UA with bacteriuria and pyuria.  Cultures obtained. She was admitted with A-fib with RVR, severe sepsis with encephalopathy and hypoxic respiratory failure due to COVID-19.  Initially started on Cardizem drip and later transitioned to IV amiodarone.  Also started on Decadron for COVID-19 infection and ceftriaxone for UTI.   Patient's RVR resolved.  Blood and urine cultures NGTD.  Encephalopathy resolved.  Completed 5 days of IV antibiotics for possible UTI on 2/23.    Assessment and Plan:  Acute hypoxemic respiratory failure due to COVID-19 (HCC) -In ED, required O2 3 L via [place] -Resolved, O2 sats 97% on room  air at discharge -Continue incentive spirometry, ambulation, PT OT --Continue Xopenex, added Dulera   New onset atrial fibrillation with RVR:  - Provoked by sepsis/COVID?  -2D echo showed EF of 70 to 75%, TSH normal -Cardiology was consulted, recommended Amiodarone 400 mg twice daily until 03/09/2023, followed by 200 mg once daily after that, Eliquis 5 mg twice daily, Cardizem CD 360 mg daily -Continue amiodarone 200 mg daily, outpatient follow-up with cardiology scheduled on 03/17/2023.     Severe sepsis with septic encephalopathy due to COVID-19 infection: POA  -CT head with no acute findings. -ABG, TSH, B12, Depakote level, and ammonia level normal -UA concerning for UTI, urine culture, blood cultures NTD, no FND's -Encephalopathy likely due to COVID-19 infection-resolved . -Currently alert and oriented x 4 -Decadron discontinued on 2/23.       Chronic diastolic CHF:  -2D echo showed EF of 70 send 5%, TSH normal.   -Intermittently diuresed with IV Lasix, for now continue Lasix 40 mg p.o. twice daily  -Negative balance of 3.9 L at discharge    AKI with lactic acidosis - Baseline Cr 0.6-0.7.  Likely ATN due to sepsis and rhabdo.  Resolved. -Improved, creatinine 0.7 at discharge   Anxiety, bipolar disorder: -Seen by psychiatry during this admission -Continue Depakote.  Denies recent change.  Depakote and ammonia level within normal. -Per psychiatry, can try lower doses of doxepin 10 mg nightly for sleep, continue Lyrica   Prediabetes:  Hemoglobin A1c 6.2 on 02/16/2023  -Continue to monitor   Essential hypertension -Continue Cardizem.   -Amlodipine discontinued   Fall at home/physical deconditioning: -  Lives alone and  ambulates using rollator.  Still drives.   - Unknown mechanism of fall but in A-fib with RVR on arrival.  Also septic and encephalopathic with COVID.  -CT head and CT cervical spine without acute finding.  -No new FND's.  Fall precautions, PT recommended  SNF    Bilateral hand paresthesia:  -CT cervical spine showed reversal of cervical lordosis with trace anterolisthesis C2 on C3, C3 on C4 and C4 on C5, likely degenerative.  Neuroexam intact.  B12 within normal. -Continue home Lyrica   Right orbital mass: Incidental 1.9 cm right inferomedial orbital mass.  -Radiology recommended  further evaluation with nonemergent orbital MRI with and without contrast.   Left forearm bula/blisters: Unclear cause for this.  Likely from the fall -Continue wound care   Traumatic rhabdomyolysis: Mild.  Resolved.   Morbid obesity class III:  Estimated body mass index is 43.82 kg/m as calculated from the following:   Height as of this encounter: 5\' 4"  (1.626 m).   Weight as of this encounter: 115.8 kg.        Pain control - [employer] Controlled Substance Reporting System database was reviewed. and patient was instructed, not to drive, operate heavy machinery, perform activities at heights, swimming or participation in water activities or provide baby-sitting services while on Pain, Sleep and Anxiety Medications; until their outpatient Physician has advised to do so again. Also recommended to not to take more than prescribed Pain, Sleep and Anxiety Medications.  Consultants: Psychiatry, cardiology Procedures performed: None Disposition: Skilled nursing facility Diet recommendation:  Discharge Diet Orders (From admission, onward)     Start     Ordered   03/10/23 0000  Diet - low sodium heart healthy        03/10/23 1158            DISCHARGE MEDICATION: Allergies as of 03/10/2023       Reactions   Morphine Nausea And Vomiting   Other reaction(s): Other (See Comments) Unspecified reaction   Other Other (See Comments)   Allergic to muscle relaxers -unknown reaction Flexeril only can take cyclobenzaprine.        Medication List     STOP taking these medications    amLODipine 2.5 MG tablet Commonly known as: NORVASC    cimetidine 200 MG tablet Commonly known as: TAGAMET   doxepin 10 MG capsule Commonly known as: SINEQUAN   labetalol 200 MG tablet Commonly known as: NORMODYNE       TAKE these medications    acetaminophen 500 MG tablet Commonly known as: TYLENOL Take 1 tablet (500 mg total) by mouth every 6 (six) hours as needed for mild pain (pain score 1-3) (or Fever >/= 101).   amiodarone 200 MG tablet Commonly known as: PACERONE Take 1 tablet (200 mg total) by mouth daily. Start taking on: March 11, 2023   apixaban 5 MG Tabs tablet Commonly known as: ELIQUIS Take 1 tablet (5 mg total) by mouth 2 (two) times daily.   atorvastatin 40 MG tablet Commonly known as: LIPITOR Take 1 tablet (40 mg total) by mouth daily.   benzoyl peroxide 5 % external wash Generic drug: benzoyl peroxide Apply topically 2 (two) times daily. Please help her find over-the-counter if not covered by the insurance.  If not available please let us know we can see if the insurance will cover BenzaClin topical.   diltiazem 360 MG 24 hr capsule Commonly known as: CARDIZEM CD Take 1 capsule (360 mg total) by mouth daily. Start taking  on: March 11, 2023   divalproex 500 MG 24 hr tablet Commonly known as: DEPAKOTE ER Take 3 tablets (1,500 mg total) by mouth at bedtime.   famotidine 20 MG tablet Commonly known as: PEPCID Take 20 mg by mouth daily.   furosemide 40 MG tablet Commonly known as: LASIX Take 1 tablet (40 mg total) by mouth 2 (two) times daily.   hydrOXYzine 25 MG tablet Commonly known as: ATARAX Take 1 tablet (25 mg total) by mouth 3 (three) times daily as needed for itching.   levalbuterol 0.63 MG/3ML nebulizer solution Commonly known as: XOPENEX Take 3 mLs (0.63 mg total) by nebulization every 6 (six) hours as needed for wheezing or shortness of breath.   mometasone-formoterol 100-5 MCG/ACT Aero Commonly known as: DULERA Inhale 2 puffs into the lungs 2 (two) times daily.   oxybutynin 5 MG  tablet Commonly known as: DITROPAN Take 1 tablet (5 mg total) by mouth 2 (two) times daily.   polyethylene glycol 17 g packet Commonly known as: MIRALAX / GLYCOLAX Take 17 g by mouth daily.   pregabalin 50 MG capsule Commonly known as: LYRICA Take 1 capsule (50 mg total) by mouth 3 (three) times daily.   promethazine 25 MG tablet Commonly known as: PHENERGAN TAKE ONE TABLET BY MOUTH EVERY 8 HOURS AS NEEDED FOR NAUSEA AND VOMITING   senna-docusate 8.6-50 MG tablet Commonly known as: Senokot-S Take 2 tablets by mouth at bedtime.               Discharge Care Instructions  (From admission, onward)           Start     Ordered   03/10/23 0000  Discharge wound care:       Comments: Wound care  Every 3 days     Comments: Cover blistered areas with single layer of xeroform gauze, top with dry dressing and kerlix or foam on the chest, change every 3 days   03/10/23 1158            Contact information for follow-up providers     Eustace Pen, PA-C Follow up on 03/17/2023.   Specialty: Cardiology Why: Follow up appointment for atrial fibrillation is at 8:30 AM. Please arrive 15 minutes early and bring your medications. Contact information: [address] [place] [address] [phone]         Agapito Games, MD. Schedule an appointment as soon as possible for a visit in 2 week(s).   Specialty: Family Medicine Why: for hospital follow-up Contact information: [address] [place] [address] [phone]              Contact information for after-discharge care     Destination     [facility] .   Service: Skilled Nursing Contact information: [address] [place] [address] [phone]                    Discharge Exam: Filed Weights   03/08/23 0539 03/09/23 0418 03/10/23 0410  Weight: 112.4 kg 115.8 kg 110.7 kg   S: No acute complaints.  Feels she is ready to go  and start rehab.  No fever chills, chest pain or shortness of breath.  BP 126/89 (BP Location: Right Arm)   Pulse 95   Temp (!) 97.5 F (36.4 C) (Oral)   Resp 18   Ht 5\' 4"  (1.626 m)   Wt 110.7 kg   SpO2  97%   BMI 41.88 kg/m   Physical Exam General: Alert and oriented x 3, NAD Cardiovascular: S1 S2 clear, RRR.  Respiratory: CTAB, no wheezing Gastrointestinal: Soft, nontender, nondistended, NBS Ext: no pedal edema bilaterally Neuro: no new deficits Psych: Normal affect    Condition at discharge: fair  The results of significant diagnostics from this hospitalization (including imaging, microbiology, ancillary and laboratory) are listed below for reference.   Imaging Studies: DG CHEST PORT 1 VIEW Result Date: 03/04/2023 CLINICAL DATA:  COVID-19. EXAM: PORTABLE CHEST 1 VIEW COMPARISON:  03/01/2023 FINDINGS: Chronic sclerosis involving the right first rib. Mild blunting at the right costophrenic angle could represent atelectasis and cannot exclude small effusion. No significant airspace disease or lung consolidation. Heart size is upper limits of normal but likely accentuated by the portable AP technique. Negative for a pneumothorax. IMPRESSION: 1. Mild blunting at the right costophrenic angle could represent atelectasis and cannot exclude small effusion. Electronically Signed   By: Richarda Overlie M.D.   On: 03/04/2023 11:15   ECHOCARDIOGRAM COMPLETE Result Date: 03/02/2023    ECHOCARDIOGRAM REPORT   Patient Name:   ZANIYAH WERNETTE Date of Exam: 03/02/2023 Medical Rec #:  [id number]       Height:       64.0 in Accession #:    [id number]      Weight:       251.1 lb Date of Birth:  04-05-1950       BSA:          2.155 m Patient Age:    72 years        BP:           126/88 mmHg Patient Gender: F               HR:           101 bpm. Exam Location:  Inpatient Procedure: 2D Echo, Cardiac Doppler and Color Doppler (Both Spectral and Color            Flow Doppler were utilized during procedure).  Indications:    atrial fibrillation  History:        Patient has no prior history of Echocardiogram examinations.                 CHF; Risk Factors:Hypertension.  Sonographer:    Amy Chionchio Referring Phys: [id number] PETER M [place]  Sonographer Comments: Image acquisition challenging due to patient body habitus. IMPRESSIONS  1. Left ventricular ejection fraction, by estimation, is 70 to 75%. The left ventricle has hyperdynamic function. Left ventricular endocardial border not optimally defined to evaluate regional wall motion. Left ventricular diastolic parameters are indeterminate.  2. Right ventricular systolic function is normal. The right ventricular size is normal. Tricuspid regurgitation signal is inadequate for assessing PA pressure.  3. The mitral valve was not well visualized. No evidence of mitral valve regurgitation. No evidence of mitral stenosis.  4. The aortic valve was not well visualized. Aortic valve regurgitation is not visualized. No aortic stenosis is present.  5. The inferior vena cava is normal in size with greater than 50% respiratory variability, suggesting right atrial pressure of 3 mmHg. FINDINGS  Left Ventricle: Left ventricular ejection fraction, by estimation, is 70 to 75%. The left ventricle has hyperdynamic function. Left ventricular endocardial border not optimally defined to evaluate regional wall motion. The left ventricular internal cavity size was normal in size. There is borderline left ventricular hypertrophy. Left ventricular diastolic parameters are indeterminate. Right Ventricle: The  right ventricular size is normal. Right vetricular wall thickness was not well visualized. Right ventricular systolic function is normal. Tricuspid regurgitation signal is inadequate for assessing PA pressure. Left Atrium: Left atrial size was normal in size. Right Atrium: Right atrial size was not well visualized. Pericardium: There is no evidence of pericardial effusion. Mitral Valve: The mitral  valve was not well visualized. No evidence of mitral valve regurgitation. No evidence of mitral valve stenosis. MV peak gradient, 2.8 mmHg. The mean mitral valve gradient is 1.0 mmHg. Tricuspid Valve: The tricuspid valve is normal in structure. Tricuspid valve regurgitation is trivial. No evidence of tricuspid stenosis. Aortic Valve: The aortic valve was not well visualized. Aortic valve regurgitation is not visualized. No aortic stenosis is present. Aortic valve mean gradient measures 4.0 mmHg. Aortic valve peak gradient measures 7.6 mmHg. Pulmonic Valve: The pulmonic valve was normal in structure. Pulmonic valve regurgitation is trivial. No evidence of pulmonic stenosis. Aorta: The aortic root is normal in size and structure. Venous: The inferior vena cava is normal in size with greater than 50% respiratory variability, suggesting right atrial pressure of 3 mmHg. IAS/Shunts: The interatrial septum was not well visualized.   LV Volumes (MOD) LV vol d, MOD A2C: 33.6 ml LV vol d, MOD A4C: 43.4 ml LV vol s, MOD A2C: 11.4 ml LV vol s, MOD A4C: 12.2 ml LV SV MOD A2C:     22.2 ml LV SV MOD A4C:     43.4 ml LV SV MOD BP:      26.8 ml RIGHT VENTRICLE          IVC RV Basal diam:  4.50 cm  IVC diam: 1.60 cm RV Mid diam:    3.70 cm TAPSE (M-mode): 2.3 cm LEFT ATRIUM           Index LA Vol (A2C): 21.6 ml 10.02 ml/m  AORTIC VALVE AV Vmax:           138.00 cm/s AV Vmean:          91.100 cm/s AV VTI:            0.153 m AV Peak Grad:      7.6 mmHg AV Mean Grad:      4.0 mmHg LVOT Vmax:         86.20 cm/s LVOT Vmean:        46.200 cm/s LVOT VTI:          0.094 m LVOT/AV VTI ratio: 0.61 MITRAL VALVE MV Peak grad: 2.8 mmHg  SHUNTS MV Mean grad: 1.0 mmHg  Systemic VTI: 0.09 m MV Vmax:      0.83 m/s MV Vmean:     51.8 cm/s Weston Brass MD Electronically signed by Weston Brass MD Signature Date/Time: 03/02/2023/4:28:15 PM    Final    CT Head Wo Contrast Result Date: 03/01/2023 CLINICAL DATA:  Fever generalized weakness found  down EXAM: CT HEAD WITHOUT CONTRAST CT CERVICAL SPINE WITHOUT CONTRAST TECHNIQUE: Multidetector CT imaging of the head and cervical spine was performed following the standard protocol without intravenous contrast. Multiplanar CT image reconstructions of the cervical spine were also generated. RADIATION DOSE REDUCTION: This exam was performed according to the departmental dose-optimization program which includes automated exposure control, adjustment of the mA and/or kV according to patient size and/or use of iterative reconstruction technique. COMPARISON:  CT brain 02/01/2010 FINDINGS: CT HEAD FINDINGS Brain: Significant motion degradation limits the exam. No hemorrhage is visualized. Atrophy and mild chronic small vessel ischemic changes of  the white matter. Nonenlarged ventricles. Vascular: No hyperdense vessels.  No unexpected calcification Skull: No gross fracture Sinuses/Orbits: Postsurgical changes of the anterior maxillary sinus walls. Patchy mucosal thickening in the ethmoid sinuses. Fluid level left maxillary sinus. Right inferomedial slightly dense right orbital mass measuring 1.9 x 1.3 cm. Other: None CT CERVICAL SPINE FINDINGS Alignment: Significant motion degradation. Reversal of cervical lordosis. Trace anterolisthesis C2 on C3, C3 on C4 and C4 on C5. Facet alignment grossly within normal limits Skull base and vertebrae: No obvious fracture. Soft tissues and spinal canal: Indistinct prevertebral soft tissues, largely degraded by motion Disc levels: Moderate severe disc space narrowing C4-C5, C5-C6 and C6-C7. Multilevel facet degenerative changes with foraminal narrowing. Upper chest: Lung apices are clear Other: None IMPRESSION: 1. Significant motion degradation limits the exam. No acute intracranial abnormality is seen. Atrophy and chronic small vessel ischemic changes of the white matter. 2. Reversal of cervical lordosis with trace anterolisthesis C2 on C3, C3 on C4 and C4 on C5, likely degenerative.  No definitive fracture is seen. Indistinct prevertebral soft tissues, largely degraded by motion but difficult to exclude prevertebral edema. Repeat CT when patient able to remain still, versus MRI attempt could be considered if high clinical suspicion for acute cervical injury. 3. 1.9 cm right inferomedial orbital mass, recommend further evaluation with nonemergent orbital MRI with and without contrast. 4. Fluid level in the left maxillary sinus, correlate for acute sinusitis. Electronically Signed   By: Jasmine Pang M.D.   On: 03/01/2023 19:17   CT Cervical Spine Wo Contrast Result Date: 03/01/2023 CLINICAL DATA:  Fever generalized weakness found down EXAM: CT HEAD WITHOUT CONTRAST CT CERVICAL SPINE WITHOUT CONTRAST TECHNIQUE: Multidetector CT imaging of the head and cervical spine was performed following the standard protocol without intravenous contrast. Multiplanar CT image reconstructions of the cervical spine were also generated. RADIATION DOSE REDUCTION: This exam was performed according to the departmental dose-optimization program which includes automated exposure control, adjustment of the mA and/or kV according to patient size and/or use of iterative reconstruction technique. COMPARISON:  CT brain 02/01/2010 FINDINGS: CT HEAD FINDINGS Brain: Significant motion degradation limits the exam. No hemorrhage is visualized. Atrophy and mild chronic small vessel ischemic changes of the white matter. Nonenlarged ventricles. Vascular: No hyperdense vessels.  No unexpected calcification Skull: No gross fracture Sinuses/Orbits: Postsurgical changes of the anterior maxillary sinus walls. Patchy mucosal thickening in the ethmoid sinuses. Fluid level left maxillary sinus. Right inferomedial slightly dense right orbital mass measuring 1.9 x 1.3 cm. Other: None CT CERVICAL SPINE FINDINGS Alignment: Significant motion degradation. Reversal of cervical lordosis. Trace anterolisthesis C2 on C3, C3 on C4 and C4 on C5.  Facet alignment grossly within normal limits Skull base and vertebrae: No obvious fracture. Soft tissues and spinal canal: Indistinct prevertebral soft tissues, largely degraded by motion Disc levels: Moderate severe disc space narrowing C4-C5, C5-C6 and C6-C7. Multilevel facet degenerative changes with foraminal narrowing. Upper chest: Lung apices are clear Other: None IMPRESSION: 1. Significant motion degradation limits the exam. No acute intracranial abnormality is seen. Atrophy and chronic small vessel ischemic changes of the white matter. 2. Reversal of cervical lordosis with trace anterolisthesis C2 on C3, C3 on C4 and C4 on C5, likely degenerative. No definitive fracture is seen. Indistinct prevertebral soft tissues, largely degraded by motion but difficult to exclude prevertebral edema. Repeat CT when patient able to remain still, versus MRI attempt could be considered if high clinical suspicion for acute cervical injury. 3. 1.9 cm right inferomedial  orbital mass, recommend further evaluation with nonemergent orbital MRI with and without contrast. 4. Fluid level in the left maxillary sinus, correlate for acute sinusitis. Electronically Signed   By: Jasmine Pang M.D.   On: 03/01/2023 19:17   DG Chest Port 1 View Result Date: 03/01/2023 CLINICAL DATA:  Question or sepsis. EXAM: PORTABLE CHEST 1 VIEW COMPARISON:  Chest radiograph dated 06/06/2011 FINDINGS: The shallow inspiration with minimal bibasilar atelectasis. No consolidative changes, pleural effusion, pneumothorax. The cardiac silhouette is within normal limits. No acute osseous pathology. IMPRESSION: Minimal bibasilar atelectasis. No focal consolidation. Electronically Signed   By: Elgie Collard M.D.   On: 03/01/2023 15:45    Microbiology: Results for orders placed or performed during the hospital encounter of 03/01/23  Blood Culture (routine x 2)     Status: None   Collection Time: 03/01/23  4:01 PM   Specimen: BLOOD RIGHT ARM  Result  Value Ref Range Status   Specimen Description BLOOD RIGHT ARM  Final   Special Requests   Final    BOTTLES DRAWN AEROBIC ONLY Blood Culture results may not be optimal due to an inadequate volume of blood received in culture bottles   Culture   Final    NO GROWTH 5 DAYS Performed at [facility], [address] [place], [place] [address]    Report Status 03/06/2023 FINAL  Final  Blood Culture (routine x 2)     Status: None   Collection Time: 03/01/23  4:40 PM   Specimen: BLOOD RIGHT ARM  Result Value Ref Range Status   Specimen Description BLOOD RIGHT ARM  Final   Special Requests   Final    BOTTLES DRAWN AEROBIC AND ANAEROBIC Blood Culture adequate volume   Culture   Final    NO GROWTH 5 DAYS Performed at [facility], [address], [place], [place] [address]    Report Status 03/06/2023 FINAL  Final  Resp panel by RT-PCR (RSV, Flu A&B, Covid) Anterior Nasal Swab     Status: Abnormal   Collection Time: 03/01/23  4:48 PM   Specimen: Anterior Nasal Swab  Result Value Ref Range Status   SARS Coronavirus 2 by RT PCR POSITIVE (A) NEGATIVE Final   Influenza A by PCR NEGATIVE NEGATIVE Final   Influenza B by PCR NEGATIVE NEGATIVE Final    Comment: (NOTE) The Xpert Xpress SARS-CoV-2/FLU/RSV plus assay is intended as an aid in the diagnosis of influenza from Nasopharyngeal swab specimens and should not be used as a sole basis for treatment. Nasal washings and aspirates are unacceptable for Xpert Xpress SARS-CoV-2/FLU/RSV testing.  Fact Sheet for Patients: BloggerCourse.com  Fact Sheet for Healthcare Providers: SeriousBroker.it  This test is not yet approved or cleared by the [place] FDA and has been authorized for detection and/or diagnosis of SARS-CoV-2 by FDA under an Emergency Use Authorization (EUA). This EUA will remain in effect (meaning this test can be used) for the duration of the COVID-19 declaration  under Section 564(b)(1) of the Act, 21 U.S.C. section 360bbb-3(b)(1), unless the authorization is terminated or revoked.     Resp Syncytial Virus by PCR NEGATIVE NEGATIVE Final    Comment: (NOTE) Fact Sheet for Patients: BloggerCourse.com  Fact Sheet for Healthcare Providers: SeriousBroker.it  This test is not yet approved or cleared by the [place] FDA and has been authorized for detection and/or diagnosis of SARS-CoV-2 by FDA under an Emergency Use Authorization (EUA). This EUA will remain in effect (meaning this test can  be used) for the duration of the COVID-19 declaration under Section 564(b)(1) of the Act, 21 U.S.C. section 360bbb-3(b)(1), unless the authorization is terminated or revoked.  Performed at [facility], [address] [place], [place] [address]   Urine Culture     Status: None   Collection Time: 03/02/23  3:00 PM   Specimen: Urine, Random  Result Value Ref Range Status   Specimen Description URINE, RANDOM  Final   Special Requests NONE Reflexed from [id number]  Final   Culture   Final    NO GROWTH Performed at [facility], [address], [place], [place] [address]    Report Status 03/03/2023 FINAL  Final    Labs: CBC: Recent Labs  Lab 03/05/23 0838 03/06/23 0421 03/08/23 0535  WBC 5.8 8.1 8.1  HGB 13.6 12.7 11.7*  HCT 37.8 36.7 33.6*  MCV 94.3 97.3 95.5  PLT 177 154 280   Basic Metabolic Panel: Recent Labs  Lab 03/04/23 0341 03/05/23 0838 03/06/23 0421 03/08/23 0535  NA 134* 133* 133* 133*  K 4.2 4.6 4.8 4.4  CL 97* 102 100 96*  CO2 22 21* 19* 29  GLUCOSE 177* 176* 159* 107*  BUN 22 20 20 14   CREATININE 0.73 0.62 0.71 0.72  CALCIUM 9.4 8.8* 8.8* 8.7*  MG  --  1.9 1.8 1.9  PHOS  --  2.7  --  4.3   Liver Function Tests: Recent Labs  Lab 03/05/23 0838 03/06/23 0421 03/08/23 0535  AST  --  24  --   ALT  --  26  --   ALKPHOS  --  42  --   BILITOT  --  0.5  --    PROT  --  5.1*  --   ALBUMIN 3.0* 2.7* 2.6*   CBG: No results for input(s): "GLUCAP" in the last 168 hours.  Discharge time spent: greater than 30 minutes.  Signed: Thad Ranger, MD [facility] 03/10/2023

## 2023-03-10 NOTE — TOC Progression Note (Signed)
 Transition of Care [facility]) - Progression Note    Patient Details  Name: Donna Sexton MRN: [id number] Date of Birth: Sep 21, 1950  Transition of Care [facility]) CM/SW Contact  Delilah Shan, LCSWA Phone Number: 03/10/2023, 10:09 AM  Clinical Narrative:     [name] confirmed they can accept patient today if medically ready for dc. CSW informed MD. CSW will continue to follow.   Expected Discharge Plan: (P) Skilled Nursing Facility Barriers to Discharge: (P) Continued Medical Work up  Expected Discharge Plan and Services   Discharge Planning Services: CM Consult   Living arrangements for the past 2 months: Single Family Home                                       Social Determinants of Health (SDOH) Interventions SDOH Screenings   Food Insecurity: No Food Insecurity (03/02/2023)  Recent Concern: Food Insecurity - Food Insecurity Present (02/13/2023)  Housing: Low Risk  (03/02/2023)  Transportation Needs: No Transportation Needs (03/02/2023)  Utilities: Not At Risk (03/02/2023)  Alcohol Screen: Low Risk  (03/28/2022)  Depression (PHQ2-9): Low Risk  (08/05/2022)  Financial Resource Strain: Low Risk  (02/13/2023)  Physical Activity: Insufficiently Active (02/13/2023)  Social Connections: Socially Isolated (03/02/2023)  Stress: Stress Concern Present (02/13/2023)  Tobacco Use: Low Risk  (03/02/2023)    Readmission Risk Interventions     No data to display

## 2023-03-10 NOTE — TOC Transition Note (Signed)
 Transition of Care [facility]) - Discharge Note   Patient Details  Name: Donna Sexton MRN: [id number] Date of Birth: 10/12/1950  Transition of Care [facility]) CM/SW Contact:  Delilah Shan, LCSWA Phone Number: 03/10/2023, 12:39 PM   Clinical Narrative:     [name]   Anticipated DC date: 03/10/2023  Family notified: Iona Hansen  Transport by: Sharin Mons  ?  Per MD patient ready for DC to  [facility] SN[facility]. RN, patient, patient's family, and facility notified of DC. Discharge Summary sent to facility. RN given number for report [phone] or [phone] RM# [phone]. DC packet on chart. Ambulance transport requested for patient.  CSW signing off.   Final next level of care: Skilled Nursing Facility Barriers to Discharge: No Barriers Identified   Patient Goals and CMS Choice Patient states their goals for this hospitalization and ongoing recovery are:: SNF CMS Medicare.gov Compare Post Acute Care list provided to:: Patient Choice offered to / list presented to : Patient      Discharge Placement              [facility] Patient to be transferred to facility by: [facility] Name of family member notified: Iona Hansen Patient and family notified of of transfer: 03/10/23  Discharge Plan and Services Additional resources added to the After Visit Summary for     Discharge Planning Services: CM Consult                                 Social Drivers of Health (SDOH) Interventions SDOH Screenings   Food Insecurity: No Food Insecurity (03/02/2023)  Recent Concern: Food Insecurity - Food Insecurity Present (02/13/2023)  Housing: Low Risk  (03/02/2023)  Transportation Needs: No Transportation Needs (03/02/2023)  Utilities: Not At Risk (03/02/2023)  Alcohol Screen: Low Risk  (03/28/2022)  Depression (PHQ2-9): Low Risk  (08/05/2022)  Financial Resource Strain: Low Risk  (02/13/2023)  Physical Activity: Insufficiently Active (02/13/2023)  Social  Connections: Socially Isolated (03/02/2023)  Stress: Stress Concern Present (02/13/2023)  Tobacco Use: Low Risk  (03/02/2023)     Readmission Risk Interventions     No data to display

## 2023-03-13 ENCOUNTER — Encounter: Payer: Self-pay | Admitting: Family Medicine

## 2023-03-13 ENCOUNTER — Telehealth: Payer: Self-pay | Admitting: Family Medicine

## 2023-03-13 DIAGNOSIS — H0589 Other disorders of orbit: Secondary | ICD-10-CM | POA: Insufficient documentation

## 2023-03-13 HISTORY — DX: Other disorders of orbit: H05.89

## 2023-03-13 NOTE — Telephone Encounter (Signed)
 Pls call pt, needs hosp f/u

## 2023-03-14 DIAGNOSIS — I5032 Chronic diastolic (congestive) heart failure: Secondary | ICD-10-CM | POA: Diagnosis not present

## 2023-03-14 DIAGNOSIS — I4891 Unspecified atrial fibrillation: Secondary | ICD-10-CM | POA: Diagnosis not present

## 2023-03-14 DIAGNOSIS — K219 Gastro-esophageal reflux disease without esophagitis: Secondary | ICD-10-CM | POA: Diagnosis not present

## 2023-03-14 DIAGNOSIS — E785 Hyperlipidemia, unspecified: Secondary | ICD-10-CM | POA: Diagnosis not present

## 2023-03-16 DIAGNOSIS — I5032 Chronic diastolic (congestive) heart failure: Secondary | ICD-10-CM | POA: Diagnosis not present

## 2023-03-16 DIAGNOSIS — J9601 Acute respiratory failure with hypoxia: Secondary | ICD-10-CM | POA: Diagnosis not present

## 2023-03-16 DIAGNOSIS — E785 Hyperlipidemia, unspecified: Secondary | ICD-10-CM | POA: Diagnosis not present

## 2023-03-16 DIAGNOSIS — I4891 Unspecified atrial fibrillation: Secondary | ICD-10-CM | POA: Diagnosis not present

## 2023-03-17 ENCOUNTER — Ambulatory Visit (HOSPITAL_COMMUNITY): Payer: Medicare Other | Admitting: Internal Medicine

## 2023-03-21 ENCOUNTER — Ambulatory Visit (HOSPITAL_COMMUNITY)
Admission: RE | Admit: 2023-03-21 | Discharge: 2023-03-21 | Disposition: A | Discharge status: Home / Self Care | Source: Ambulatory Visit | Attending: Internal Medicine | Admitting: Internal Medicine

## 2023-03-21 ENCOUNTER — Encounter (HOSPITAL_COMMUNITY): Payer: Self-pay | Admitting: Internal Medicine

## 2023-03-21 VITALS — BP 102/62 | HR 101 | Ht 64.0 in | Wt 252.0 lb

## 2023-03-21 DIAGNOSIS — I4819 Other persistent atrial fibrillation: Secondary | ICD-10-CM | POA: Insufficient documentation

## 2023-03-21 DIAGNOSIS — Z8616 Personal history of COVID-19: Secondary | ICD-10-CM | POA: Insufficient documentation

## 2023-03-21 DIAGNOSIS — R600 Localized edema: Secondary | ICD-10-CM | POA: Insufficient documentation

## 2023-03-21 DIAGNOSIS — Z7901 Long term (current) use of anticoagulants: Secondary | ICD-10-CM | POA: Insufficient documentation

## 2023-03-21 DIAGNOSIS — Z79899 Other long term (current) drug therapy: Secondary | ICD-10-CM | POA: Diagnosis not present

## 2023-03-21 DIAGNOSIS — D6869 Other thrombophilia: Secondary | ICD-10-CM | POA: Insufficient documentation

## 2023-03-21 DIAGNOSIS — Z5181 Encounter for therapeutic drug level monitoring: Secondary | ICD-10-CM | POA: Diagnosis not present

## 2023-03-21 DIAGNOSIS — Z6841 Body Mass Index (BMI) 40.0 and over, adult: Secondary | ICD-10-CM | POA: Insufficient documentation

## 2023-03-21 DIAGNOSIS — E785 Hyperlipidemia, unspecified: Secondary | ICD-10-CM | POA: Insufficient documentation

## 2023-03-21 DIAGNOSIS — I110 Hypertensive heart disease with heart failure: Secondary | ICD-10-CM | POA: Diagnosis not present

## 2023-03-21 DIAGNOSIS — I491 Atrial premature depolarization: Secondary | ICD-10-CM | POA: Diagnosis not present

## 2023-03-21 DIAGNOSIS — I4891 Unspecified atrial fibrillation: Secondary | ICD-10-CM | POA: Diagnosis not present

## 2023-03-21 DIAGNOSIS — I5030 Unspecified diastolic (congestive) heart failure: Secondary | ICD-10-CM | POA: Insufficient documentation

## 2023-03-21 DIAGNOSIS — R7303 Prediabetes: Secondary | ICD-10-CM | POA: Diagnosis not present

## 2023-03-21 DIAGNOSIS — R000 Tachycardia, unspecified: Secondary | ICD-10-CM | POA: Insufficient documentation

## 2023-03-21 MED ORDER — METOPROLOL TARTRATE 25 MG PO TABS: 25.0000 mg | ORAL_TABLET | Freq: Two times a day (BID) | ORAL | 3 refills | Status: DC

## 2023-03-21 MED ORDER — POTASSIUM CHLORIDE CRYS ER 20 MEQ PO TBCR: 20.0000 meq | EXTENDED_RELEASE_TABLET | Freq: Two times a day (BID) | ORAL | 3 refills | Status: DC

## 2023-03-21 MED ORDER — FUROSEMIDE 40 MG PO TABS: ORAL_TABLET | ORAL | 2 refills | Status: DC

## 2023-03-21 MED ORDER — DILTIAZEM HCL ER COATED BEADS 180 MG PO CP24: 180.0000 mg | ORAL_CAPSULE | Freq: Every day | ORAL | 3 refills | Status: DC

## 2023-03-21 NOTE — Progress Notes (Signed)
 Primary Care Physician: Agapito Games, MD Primary Cardiologist: None Electrophysiologist: None     Referring Physician: Dr. Alphonse Guild is a 73 y.o. 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 Hospital admission 2/19-28/25 for Afib with RVR likely secondary to Covid-19 infection. She had severe sepsis with encephalopathy and hypoxic respiratory failure due to Covid-19. Discharged on amiodarone 200 mg daily. Patient is on Eliquis 5 mg BID for a CHADS2VASC score of 4.  On evaluation today, she is currently in sinus tachycardia. She is currently in a rehab facility and has been there for a little over a week. She notes as recent as yesterday during rehab her oxygen level will decrease with activity and it improves when taking a break and doing deep slow breathing. She notes worsening swelling in bilateral lower extremities since hospital discharge. She is taking lasix 40 mg BID; not on potassium supplementation. She lives by herself at home.   Today, she denies symptoms of palpitations, chest pain, shortness of breath, orthopnea, PND, lower extremity edema, dizziness, presyncope, syncope, snoring, daytime somnolence, bleeding, or neurologic sequela. The patient is tolerating medications without difficulties and is otherwise without complaint today.    she has a BMI of Body mass index is 43.26 kg/m.Marland Kitchen Filed Weights   03/21/23 0853  Weight: 114.3 kg    Current Outpatient Medications  Medication Sig Dispense Refill   acetaminophen (TYLENOL) 500 MG tablet Take 1 tablet (500 mg total) by mouth every 6 (six) hours as needed for mild pain (pain score 1-3) (or Fever >/= 101).     amiodarone (PACERONE) 200 MG tablet Take 1 tablet (200 mg total) by mouth daily.     apixaban (ELIQUIS) 5 MG TABS tablet Take 1  tablet (5 mg total) by mouth 2 (two) times daily.     atorvastatin (LIPITOR) 40 MG tablet Take 1 tablet (40 mg total) by mouth daily. 90 tablet 3   benzoyl peroxide 5 % external liquid Apply topically 2 (two) times daily. Please help her find over-the-counter if not covered by the insurance.  If not available please let us know we can see if the insurance will cover BenzaClin topical. 142 g 12   divalproex (DEPAKOTE ER) 500 MG 24 hr tablet Take 3 tablets (1,500 mg total) by mouth at bedtime. 270 tablet 1   famotidine (PEPCID) 20 MG tablet Take 20 mg by mouth daily.     hydrOXYzine (ATARAX) 25 MG tablet Take 1 tablet (25 mg total) by mouth 3 (three) times daily as needed for itching.     levalbuterol (XOPENEX) 0.63 MG/3ML nebulizer solution Take 3 mLs (0.63 mg total) by nebulization every 6 (six) hours as needed for wheezing or shortness of breath.     metoprolol tartrate (LOPRESSOR) 25 MG tablet Take 1 tablet (25 mg total) by mouth 2 (two) times daily. 60 tablet 3   mometasone-formoterol (DULERA) 100-5 MCG/ACT AERO Inhale 2 puffs into the lungs 2 (two) times daily.     oxybutynin (DITROPAN) 5 MG tablet Take 1 tablet (5 mg total) by mouth 2 (two) times daily. 180 tablet 1   polyethylene glycol (MIRALAX / GLYCOLAX) 17 g packet Take 17 g by mouth daily.     potassium chloride SA (KLOR-CON M) 20 MEQ tablet Take 1 tablet (20 mEq total) by mouth 2 (  two) times daily. 60 tablet 3   pregabalin (LYRICA) 50 MG capsule Take 1 capsule (50 mg total) by mouth 3 (three) times daily. 270 capsule 1   promethazine (PHENERGAN) 25 MG tablet TAKE ONE TABLET BY MOUTH EVERY 8 HOURS AS NEEDED FOR NAUSEA AND VOMITING 90 tablet 0   senna-docusate (SENOKOT-S) 8.6-50 MG tablet Take 2 tablets by mouth at bedtime.     diltiazem (CARDIZEM CD) 180 MG 24 hr capsule Take 1 capsule (180 mg total) by mouth daily. 30 capsule 3   furosemide (LASIX) 40 MG tablet Take 80mg  in the morning by mouth and 40mg  in the evening 90 tablet 2   No  current facility-administered medications for this encounter.    Atrial Fibrillation Management history:  Previous antiarrhythmic drugs: amiodarone Previous cardioversions: none Previous ablations: none Anticoagulation history: Eliquis   ROS- All systems are reviewed and negative except as per the HPI above.  Physical Exam: BP 102/62   Pulse (!) 101   Ht 5\' 4"  (1.626 m)   Wt 114.3 kg   BMI 43.26 kg/m   GEN: Well nourished, well developed in no acute distress NECK: No JVD; No carotid bruits CARDIAC: Regular tachycardic rate and rhythm, no murmurs, rubs, gallops RESPIRATORY:  Clear to auscultation without rales, wheezing or rhonchi  ABDOMEN: Soft, non-tender, non-distended EXTREMITIES:  significant 2+ bilateral LE edema; pitting, no weeping noted  EKG today demonstrates  Vent. rate 101 BPM PR interval 158 ms QRS duration 78 ms QT/QTcB 350/453 ms P-R-T axes 45 -18 0 Sinus tachycardia with Premature atrial complexes Anterior infarct , age undetermined Abnormal ECG When compared with ECG of 01-Mar-2023 17:26, PREVIOUS ECG IS PRESENT  Echo 03/02/23 demonstrated  1. Left ventricular ejection fraction, by estimation, is 70 to 75%. The  left ventricle has hyperdynamic function. Left ventricular endocardial  border not optimally defined to evaluate regional wall motion. Left  ventricular diastolic parameters are  indeterminate.   2. Right ventricular systolic function is normal. The right ventricular  size is normal. Tricuspid regurgitation signal is inadequate for assessing  PA pressure.   3. The mitral valve was not well visualized. No evidence of mitral valve  regurgitation. No evidence of mitral stenosis.   4. The aortic valve was not well visualized. Aortic valve regurgitation  is not visualized. No aortic stenosis is present.   5. The inferior vena cava is normal in size with greater than 50%  respiratory variability, suggesting right atrial pressure of 3 mmHg.    ASSESSMENT & PLAN CHA2DS2-VASc Score = 4  The patient's score is based upon: CHF History: 1 HTN History: 1 Diabetes History: 0 Stroke History: 0 Vascular Disease History: 0 Age Score: 1 Gender Score: 1       ASSESSMENT AND PLAN: Persistent Atrial Fibrillation (ICD10:  I48.19) The patient's CHA2DS2-VASc score is 4, indicating a 4.8% annual risk of stroke.    She is currently in NSR. Recommended PCP hospital follow up. She saw Dr. Royann Shivers in hospital and would like to continue to see him for general cardiology follow up.   High risk medication monitoring (ICD10: R7229428) Patient requires ongoing monitoring for anti-arrhythmic medication which has the potential to cause life threatening arrhythmias or AV block. Qtc stable. Continue amiodarone 200 mg daily.   Secondary Hypercoagulable State (ICD10:  D68.69) The patient is at significant risk for stroke/thromboembolism based upon her CHA2DS2-VASc Score of 4.  Continue Apixaban (Eliquis).  Continue Eliquis 5 mg BID without interruption.  HFpEF/edema She has significant edema  bilaterally which may likely be related to patient being on diltiazem 360 mg daily since hospital discharge. Her lungs on exam were clear bilaterally without rales which is somewhat reassuring. Will lower dosage to diltiazem 180 mg daily. Begin Lopressor 25 mg BID for rate control. Increase lasix to 80 mg AM, 40 mg PM. Begin potassium supplementation 20 meq BID. I would like for patient to be seen by general cardiology for help with her diuresis. Her lasix 80 mg regimen from the hospital was seeming to not help at all with her fluid buildup. Agreeable with any change to a different diuretic / regimen; appreciate their help.    Follow up 3 months Afib clinic for amiodarone surveillance.    Lake Bells, PA-C  Afib Clinic [facility] [address] [place], [place] [address] [phone]

## 2023-03-21 NOTE — Patient Instructions (Signed)
 Increase lasix to 80mg  in the morning and 40mg  in the evening  Potassium (K-Dur) twice a day   Metoprolol tartrate 25mg  twice a day   Decrease cardizem to 180mg  once a day    Follow-up with cardiology in 1 week -- they will call for appt

## 2023-03-22 DIAGNOSIS — F3130 Bipolar disorder, current episode depressed, mild or moderate severity, unspecified: Secondary | ICD-10-CM | POA: Diagnosis not present

## 2023-03-22 DIAGNOSIS — F5105 Insomnia due to other mental disorder: Secondary | ICD-10-CM | POA: Diagnosis not present

## 2023-04-04 ENCOUNTER — Ambulatory Visit (HOSPITAL_COMMUNITY): Admitting: Internal Medicine

## 2023-04-05 ENCOUNTER — Ambulatory Visit: Attending: Emergency Medicine | Admitting: Emergency Medicine

## 2023-04-05 ENCOUNTER — Encounter: Payer: Self-pay | Admitting: Emergency Medicine

## 2023-04-05 VITALS — BP 115/79 | HR 81 | Wt 251.0 lb

## 2023-04-05 DIAGNOSIS — I480 Paroxysmal atrial fibrillation: Secondary | ICD-10-CM | POA: Diagnosis not present

## 2023-04-05 DIAGNOSIS — I5032 Chronic diastolic (congestive) heart failure: Secondary | ICD-10-CM

## 2023-04-05 DIAGNOSIS — E785 Hyperlipidemia, unspecified: Secondary | ICD-10-CM | POA: Diagnosis not present

## 2023-04-05 DIAGNOSIS — I10 Essential (primary) hypertension: Secondary | ICD-10-CM | POA: Diagnosis not present

## 2023-04-05 DIAGNOSIS — R002 Palpitations: Secondary | ICD-10-CM | POA: Diagnosis not present

## 2023-04-05 DIAGNOSIS — I4891 Unspecified atrial fibrillation: Secondary | ICD-10-CM | POA: Diagnosis not present

## 2023-04-05 MED ORDER — FUROSEMIDE 80 MG PO TABS: ORAL_TABLET | ORAL | 2 refills | Status: DC

## 2023-04-05 NOTE — Progress Notes (Signed)
 Cardiology Office Note:    Date:  04/05/2023  ID:  Donna Sexton, DOB February 07, 1950, MRN [id number] PCP: Agapito Games, MD  [facility] Providers Cardiologist:  Peter Swaziland, MD       Patient Profile:      Chief Complaint: Hospital follow-up for atrial fibrillation with RVR and diastolic heart failure  History of Present Illness:  Donna Sexton is a 73 y.o. 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  She has a remote stress echocardiogram and Holter monitor.  Stress echo was negative for ischemia by EKG and ultrasound findings.  Of note noncompaction was not appreciated on her echo study.  She has been following with cardiology with [facility].  Echocardiogram in 2021 showed LVEF 55-60%, normal RV and no significant valvular disease.  Nuclear stress test 02/2020 showed no inducible ischemia or scar.  Heart monitor 11/2020 showed no significant arrhythmias and triggered events were associated with normal sinus rhythm, isolated rare PACs.  She was brought to [facility] ED via [facility] on 03/01/2023 after a questionable fall at home.  She spoke with family 2 days ago and was found down by a neighbor.  There is an unknown downtime.  She was confused and noted to be in A-fib with RVR with rates in the 160s.  CK was 905, lactic acid 3.0, she is positive for COVID-19.  She was diagnosed with acute hypoxemic respiratory failure, encephalopathy and sepsis secondary to COVID infection.  She was started on IV amiodarone and IV Cardizem as well as anticoagulated with IV heparin.  Her heart rates improved.  Echocardiogram 03/02/2023 showed LVEF 70-75%, RV function normal no valvular abnormalities.  She  remained in persistent atrial fibrillation with controlled ventricular rate at discharge.  She was discharged on amiodarone 400 mg twice daily, followed by 200 mg once daily, Eliquis 5 mg twice daily, diltiazem 360 mg once daily and Lasix 40 mg twice daily.  She was to follow-up in atrial fibrillation clinic in 2 weeks.  She was seen in the A-fib clinic on 03/21/2023.  She was in normal sinus rhythm at that time.  However she did have significant edema bilaterally.  Her diltiazem was decreased to 180 mg daily and she was started on Lopressor 25 mg twice daily for rate control.  Her Lasix was increased to 80 mg in the a.m. and 40 mg in the p.m.  She was to follow-up in A-fib clinic in 3 months for amiodarone surveillance.   Discussed the use of AI scribe software for clinical note transcription with the patient, who gave verbal consent to proceed.  The patient comes into clinic today with her son.  She is currently living at [facility] rehabilitation [facility] recovery [facility].  She is set to be discharged back home tomorrow.  Son notes that she will have assistance at home as she currently lives alone.  She also notes that she will not be returning to [facility] cardiology as her prior cardiologist has retired.  Today the patient notes that her lower extremity swelling is much improved.  She notes that she does have history of chronic lower extremity edema.  She notes that her swelling is back to her baseline prior to recent admission.  Patient notes  that they have been getting her up and walking her pretty consistently at the rehab facility.  She notes that she originally had some dyspnea on exertion however this has been improving every day.  She is currently walking with a walker and able to ambulate without assistance.  She denies any irregular heart rhythms or palpitations.  She denies any chest pain, shortness of breath at rest, orthopnea, PND.  Much education was given on lower extremity stockings for  swelling, low-sodium diet, exercise, leg elevation.    Review of systems:  Please see the history of present illness. All other systems are reviewed and otherwise negative.     Home Medications:    Current Meds  Medication Sig   acetaminophen (TYLENOL) 500 MG tablet Take 1 tablet (500 mg total) by mouth every 6 (six) hours as needed for mild pain (pain score 1-3) (or Fever >/= 101).   amiodarone (PACERONE) 200 MG tablet Take 1 tablet (200 mg total) by mouth daily.   apixaban (ELIQUIS) 5 MG TABS tablet Take 1 tablet (5 mg total) by mouth 2 (two) times daily.   atorvastatin (LIPITOR) 40 MG tablet Take 1 tablet (40 mg total) by mouth daily.   benzoyl peroxide 5 % external liquid Apply topically 2 (two) times daily. Please help her find over-the-counter if not covered by the insurance.  If not available please let us know we can see if the insurance will cover BenzaClin topical.   diltiazem (CARDIZEM CD) 180 MG 24 hr capsule Take 1 capsule (180 mg total) by mouth daily.   divalproex (DEPAKOTE ER) 500 MG 24 hr tablet Take 3 tablets (1,500 mg total) by mouth at bedtime.   famotidine (PEPCID) 20 MG tablet Take 20 mg by mouth daily.   furosemide (LASIX) 80 MG tablet TAKE 80 MG DAILY. MAY TAKE AN ADDITIONAL HALF TO WHOLE TABLET IF YOU GAIN 3 POUNDS OR MORE OVER NIGHT OR 5 POUNDS OR MORE IN ONE WEEK.   hydrOXYzine (ATARAX) 25 MG tablet Take 1 tablet (25 mg total) by mouth 3 (three) times daily as needed for itching.   levalbuterol (XOPENEX) 0.63 MG/3ML nebulizer solution Take 3 mLs (0.63 mg total) by nebulization every 6 (six) hours as needed for wheezing or shortness of breath.   metoprolol tartrate (LOPRESSOR) 25 MG tablet Take 1 tablet (25 mg total) by mouth 2 (two) times daily.   mometasone-formoterol (DULERA) 100-5 MCG/ACT AERO Inhale 2 puffs into the lungs 2 (two) times daily.   oxybutynin (DITROPAN) 5 MG tablet Take 1 tablet (5 mg total) by mouth 2 (two) times daily.   polyethylene glycol  (MIRALAX / GLYCOLAX) 17 g packet Take 17 g by mouth daily.   potassium chloride SA (KLOR-CON M) 20 MEQ tablet Take 1 tablet (20 mEq total) by mouth 2 (two) times daily. (Patient taking differently: Take 20 mEq by mouth daily.)   pregabalin (LYRICA) 50 MG capsule Take 1 capsule (50 mg total) by mouth 3 (three) times daily.   promethazine (PHENERGAN) 25 MG tablet TAKE ONE TABLET BY MOUTH EVERY 8 HOURS AS NEEDED FOR NAUSEA AND VOMITING   senna-docusate (SENOKOT-S) 8.6-50 MG tablet Take 2 tablets by mouth at bedtime.   [DISCONTINUED] furosemide (LASIX) 40 MG tablet Take 80mg  in the morning by mouth and 40mg  in the evening   Studies Reviewed:       Echocardiogram 03/02/2023 1. Left ventricular ejection fraction, by estimation, is 70 to 75%. The  left ventricle has hyperdynamic function. Left ventricular endocardial  border not optimally defined to evaluate regional wall motion. Left  ventricular diastolic parameters are  indeterminate.   2. Right ventricular systolic function is normal. The right ventricular  size is normal. Tricuspid regurgitation signal is inadequate for assessing  PA pressure.   3. The mitral valve was not well visualized. No evidence of mitral valve  regurgitation. No evidence of mitral stenosis.   4. The aortic valve was not well visualized. Aortic valve regurgitation  is not visualized. No aortic stenosis is present.   5. The inferior vena cava is normal in size with greater than 50%  respiratory variability, suggesting right atrial pressure of 3 mmHg.  Risk Assessment/Calculations:    CHA2DS2-VASc Score = 4   This indicates a 4.8% annual risk of stroke. The patient's score is based upon: CHF History: 1 HTN History: 1 Diabetes History: 0 Stroke History: 0 Vascular Disease History: 0 Age Score: 1 Gender Score: 1            Physical Exam:   VS:  BP 115/79 (BP Location: Left Arm, Patient Position: Sitting, Cuff Size: Large)   Pulse 81   Wt 251 lb (113.9 kg)    SpO2 94%   BMI 43.08 kg/m    Wt Readings from Last 3 Encounters:  04/05/23 251 lb (113.9 kg)  03/21/23 252 lb (114.3 kg)  03/10/23 244 lb (110.7 kg)    GEN: Well nourished, well developed in no acute distress NECK: No JVD; No carotid bruits CARDIAC: RRR, no murmurs, rubs, gallops RESPIRATORY:  Clear to auscultation without rales, wheezing or rhonchi  ABDOMEN: Soft, non-tender, non-distended EXTREMITIES: 1+ bilateral lower extremity edema; No acute deformity      Assessment and Plan:  Chronic diastolic heart failure Echocardiogram 02/2023 LVEF 70 to 75% Seems to have experienced exacerbation after discharge from hospital.  Was discharged on 80 mg daily of furosemide.  Seen by A-fib clinic on 03/21/2023 and she was noted to have severe LE edema.  The dosage of diltiazem was lowered and she was instructed to take an extra 40 mg of Lasix daily in the p.m. - Today patient is overall euvolemic on exam.  No SOB, orthopnea, PND. - Patient described drastic improvement in her lower extremity swelling.  She does have chronic vascular insufficiency.  She is currently back to her baseline leg swelling preadmission with 1+ lower extremity edema. - I suspect her lower extremity edema is likely more dependent pitting edema or lymphedema that is complicated by obesity and sedentary lifestyle - Prior to her admission she was maintained on 40 mg furosemide BID - Today I will discontinue her extra 40 mg of furosemide and continue her on 80 mg furosemide daily.  I will also decrease her potassium supplement from 40 mEq to 20 mEq.  BMET will be ordered today - I have advised her she can take extra 40 mg tablet if weight gain of 3 pounds in 1 night or 5 pounds in 1 week.  Daily weights highly encouraged - Much education given on lower extremity stockings, low-sodium dieting, leg elevation, and exercise.  I will have her come back in 6 weeks for quick follow-up  Atrial fibrillation Today she remains in NSR by  auscultation No current complaints or symptoms suggestive of recurrent atrial fibrillation - Atrial fibrillation and amiodarone monitoring managed by atrial fibrillation clinic.  Due for follow-up in 3 months - Continue amiodarone 200 mg daily, diltiazem 180 mg daily, metoprolol tartrate 25 mg twice daily - Continue Eliquis 5  mg twice daily, no bleeding concerns  CHA2DS2-VASc Score = 4 [CHF History: 1, HTN History: 1, Diabetes History: 0, Stroke History: 0, Vascular Disease History: 0, Age Score: 1, Gender Score: 1].  Therefore, the patient's annual risk of stroke is 4.8 %.      Hypertension Blood pressure today is 115/79 and under excellent control - Continue metoprolol, diltiazem, furosemide            Dispo:  Return in about 8 weeks (around 05/31/2023).  Signed, Denyce Robert, NP

## 2023-04-05 NOTE — Patient Instructions (Addendum)
 Medication Instructions:  DECREASE YOUR LASIX TO 80 MG DAILY. MAY TAKE AN ADDITIONAL HALF TO WHOLE TABLET IF YOU GAIN 3 POUNDS OR MORE OVER NIGHT OR 5 POUNDS OR MORE IN ONE WEEK.  BUY COMPRESSION STOCKING ON AMAZON AND ELEVATE YOUR LEGS DAILY.  WEIGH YOURSELF EACH DAY AND RECORD WEIGHTS.  DECREASE POTASSIUM TO 1 TABLET DAILY.  Lab Work: BMET               Testing/Procedures: START A LOW SODIUM DIET. [place]:      Follow-Up: At [facility], you and your health needs are our priority.  As part of our continuing mission to provide you with exceptional heart care, we have created designated Provider Care Teams.  These Care Teams include your primary Cardiologist (physician) and Advanced Practice Providers (APPs -  Physician Assistants and Nurse Practitioners) who all work together to provide you with the care you need, when you need it.   Your next appointment:   6-8 WEEKS  Provider:   Rise Paganini, DNP  Other Instructions:

## 2023-04-06 ENCOUNTER — Inpatient Hospital Stay: Admitting: Family Medicine

## 2023-04-06 DIAGNOSIS — R2689 Other abnormalities of gait and mobility: Secondary | ICD-10-CM | POA: Diagnosis not present

## 2023-04-06 DIAGNOSIS — M6250 Muscle wasting and atrophy, not elsewhere classified, unspecified site: Secondary | ICD-10-CM | POA: Diagnosis not present

## 2023-04-06 DIAGNOSIS — M6281 Muscle weakness (generalized): Secondary | ICD-10-CM | POA: Diagnosis not present

## 2023-04-06 LAB — BASIC METABOLIC PANEL WITH GFR
BUN/Creatinine Ratio: 13 (ref 12–28)
BUN: 11 mg/dL (ref 8–27)
CO2: 21 mmol/L (ref 20–29)
Calcium: 10.4 mg/dL — ABNORMAL HIGH (ref 8.7–10.3)
Chloride: 96 mmol/L (ref 96–106)
Creatinine, Ser: 0.87 mg/dL (ref 0.57–1.00)
Glucose: 95 mg/dL (ref 70–99)
Potassium: 4.8 mmol/L (ref 3.5–5.2)
Sodium: 140 mmol/L (ref 134–144)
eGFR: 70 mL/min/{1.73_m2} (ref >59)

## 2023-04-07 DIAGNOSIS — Z6841 Body Mass Index (BMI) 40.0 and over, adult: Secondary | ICD-10-CM | POA: Diagnosis not present

## 2023-04-07 DIAGNOSIS — I110 Hypertensive heart disease with heart failure: Secondary | ICD-10-CM | POA: Diagnosis not present

## 2023-04-07 DIAGNOSIS — Z7901 Long term (current) use of anticoagulants: Secondary | ICD-10-CM | POA: Diagnosis not present

## 2023-04-07 DIAGNOSIS — F411 Generalized anxiety disorder: Secondary | ICD-10-CM | POA: Diagnosis not present

## 2023-04-07 DIAGNOSIS — Z993 Dependence on wheelchair: Secondary | ICD-10-CM | POA: Diagnosis not present

## 2023-04-07 DIAGNOSIS — U071 COVID-19: Secondary | ICD-10-CM | POA: Diagnosis not present

## 2023-04-07 DIAGNOSIS — E7800 Pure hypercholesterolemia, unspecified: Secondary | ICD-10-CM | POA: Diagnosis not present

## 2023-04-07 DIAGNOSIS — Z9181 History of falling: Secondary | ICD-10-CM | POA: Diagnosis not present

## 2023-04-07 DIAGNOSIS — K219 Gastro-esophageal reflux disease without esophagitis: Secondary | ICD-10-CM | POA: Diagnosis not present

## 2023-04-07 DIAGNOSIS — E119 Type 2 diabetes mellitus without complications: Secondary | ICD-10-CM | POA: Diagnosis not present

## 2023-04-07 DIAGNOSIS — N179 Acute kidney failure, unspecified: Secondary | ICD-10-CM | POA: Diagnosis not present

## 2023-04-07 DIAGNOSIS — Z556 Problems related to health literacy: Secondary | ICD-10-CM | POA: Diagnosis not present

## 2023-04-07 DIAGNOSIS — N3281 Overactive bladder: Secondary | ICD-10-CM | POA: Diagnosis not present

## 2023-04-07 DIAGNOSIS — D3161 Benign neoplasm of unspecified site of right orbit: Secondary | ICD-10-CM | POA: Diagnosis not present

## 2023-04-07 DIAGNOSIS — M5030 Other cervical disc degeneration, unspecified cervical region: Secondary | ICD-10-CM | POA: Diagnosis not present

## 2023-04-07 DIAGNOSIS — I4891 Unspecified atrial fibrillation: Secondary | ICD-10-CM | POA: Diagnosis not present

## 2023-04-07 DIAGNOSIS — G2581 Restless legs syndrome: Secondary | ICD-10-CM | POA: Diagnosis not present

## 2023-04-07 DIAGNOSIS — J9601 Acute respiratory failure with hypoxia: Secondary | ICD-10-CM | POA: Diagnosis not present

## 2023-04-07 DIAGNOSIS — M170 Bilateral primary osteoarthritis of knee: Secondary | ICD-10-CM | POA: Diagnosis not present

## 2023-04-07 DIAGNOSIS — I5032 Chronic diastolic (congestive) heart failure: Secondary | ICD-10-CM | POA: Diagnosis not present

## 2023-04-07 DIAGNOSIS — F319 Bipolar disorder, unspecified: Secondary | ICD-10-CM | POA: Diagnosis not present

## 2023-04-10 DIAGNOSIS — M170 Bilateral primary osteoarthritis of knee: Secondary | ICD-10-CM | POA: Diagnosis not present

## 2023-04-10 DIAGNOSIS — J9601 Acute respiratory failure with hypoxia: Secondary | ICD-10-CM | POA: Diagnosis not present

## 2023-04-10 DIAGNOSIS — E119 Type 2 diabetes mellitus without complications: Secondary | ICD-10-CM | POA: Diagnosis not present

## 2023-04-10 DIAGNOSIS — I4891 Unspecified atrial fibrillation: Secondary | ICD-10-CM | POA: Diagnosis not present

## 2023-04-10 DIAGNOSIS — K219 Gastro-esophageal reflux disease without esophagitis: Secondary | ICD-10-CM | POA: Diagnosis not present

## 2023-04-10 DIAGNOSIS — N3281 Overactive bladder: Secondary | ICD-10-CM | POA: Diagnosis not present

## 2023-04-10 DIAGNOSIS — D3161 Benign neoplasm of unspecified site of right orbit: Secondary | ICD-10-CM | POA: Diagnosis not present

## 2023-04-10 DIAGNOSIS — I110 Hypertensive heart disease with heart failure: Secondary | ICD-10-CM | POA: Diagnosis not present

## 2023-04-10 DIAGNOSIS — I5032 Chronic diastolic (congestive) heart failure: Secondary | ICD-10-CM | POA: Diagnosis not present

## 2023-04-10 DIAGNOSIS — N179 Acute kidney failure, unspecified: Secondary | ICD-10-CM | POA: Diagnosis not present

## 2023-04-10 DIAGNOSIS — F319 Bipolar disorder, unspecified: Secondary | ICD-10-CM | POA: Diagnosis not present

## 2023-04-10 DIAGNOSIS — M5030 Other cervical disc degeneration, unspecified cervical region: Secondary | ICD-10-CM | POA: Diagnosis not present

## 2023-04-10 DIAGNOSIS — E7800 Pure hypercholesterolemia, unspecified: Secondary | ICD-10-CM | POA: Diagnosis not present

## 2023-04-10 DIAGNOSIS — U071 COVID-19: Secondary | ICD-10-CM | POA: Diagnosis not present

## 2023-04-10 DIAGNOSIS — Z6841 Body Mass Index (BMI) 40.0 and over, adult: Secondary | ICD-10-CM | POA: Diagnosis not present

## 2023-04-12 ENCOUNTER — Ambulatory Visit (INDEPENDENT_AMBULATORY_CARE_PROVIDER_SITE_OTHER): Admitting: Family Medicine

## 2023-04-12 ENCOUNTER — Encounter: Payer: Self-pay | Admitting: Family Medicine

## 2023-04-12 VITALS — BP 108/74 | HR 83 | Ht 64.0 in | Wt 250.0 lb

## 2023-04-12 DIAGNOSIS — R300 Dysuria: Secondary | ICD-10-CM

## 2023-04-12 DIAGNOSIS — K5900 Constipation, unspecified: Secondary | ICD-10-CM

## 2023-04-12 DIAGNOSIS — Z711 Person with feared health complaint in whom no diagnosis is made: Secondary | ICD-10-CM

## 2023-04-12 DIAGNOSIS — H0589 Other disorders of orbit: Secondary | ICD-10-CM | POA: Diagnosis not present

## 2023-04-12 DIAGNOSIS — I5032 Chronic diastolic (congestive) heart failure: Secondary | ICD-10-CM | POA: Diagnosis not present

## 2023-04-12 DIAGNOSIS — R251 Tremor, unspecified: Secondary | ICD-10-CM

## 2023-04-12 DIAGNOSIS — R202 Paresthesia of skin: Secondary | ICD-10-CM

## 2023-04-12 DIAGNOSIS — R531 Weakness: Secondary | ICD-10-CM

## 2023-04-12 NOTE — Patient Instructions (Addendum)
#  1 for burning with urination will check a urine sample and send for culture.  It could just be irritation from aggressive cleaning well at the rehab facility.  #2 for the orbital mass we will get an MRI with and without contrast scheduled 3 weeks out.  #3 for numbness in fingers and pain I do think this will improve I think it secondary to having laid in the floor for a long period of time.  The nerve should heal and wake up and improve over the next 6 to 8 weeks but if not then we can refer to neurology for nerve conduction studies.  #4 compression stockings.  Recommend getting those off of [employer] with a pressure of 25-30.  Recommend getting him in size small or medium.  [employer] and [employer] are good brands.  #5 start taking MiraLAX 1 capful mixed with 6 ounces of water or drink twice a day until bowels are soft and mushy.  #6 memory issues should get better over the next 1 to 2 months.  #7 tremor-should also hopefully improve over the next 3 months.  If not we will refer to neurology.  #8 nausea should improve as we get bowels moving more regularly.  #9-keep moving legs and arms throughout the day to work on increasing muscle strength and range of motion and build back the muscle that you have lost.

## 2023-04-12 NOTE — Progress Notes (Unsigned)
   Established Patient Office Visit  Subjective  Patient ID: Donna Sexton, female    DOB: 1950/05/25  Age: 73 y.o. MRN: [id number]  Chief Complaint  Patient presents with   Hospitalization Follow-up    HPI  Here for hospital follow-up.  She was admitted on February 19 and discharged home on February 28.  She was found down by family members and brought to the ED.  She had last spoken to someone about 24 hours before she does not recall the event herself but she was running a fever of 101 but the time she made it to the emergency room she was also in A-fib with RVR with a heart rate in the 160s which was a new diagnosis.  She was also COVID-19 positive.  She was admitted to the hospital with severe sepsis with encephalopathy and hypoxia secondary to respiratory failure from COVID-19.  She was started on a Cardizem drip and then later transition to IV amiodarone.  She has been discharged home on oral amiodarone.  She was treated with Decadron for the COVID-19 infection and ceftriaxone for UTI as well.  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 Radiology recommended  further evaluation with nonemergent orbital MRI with and without contrast.  She brought a lits ot questions.   {History (Optional):23778}  ROS    Objective:     BP 108/74 (BP Location: Left Wrist, Patient Position: Sitting, Cuff Size: Large)   Pulse 83   Ht 5\' 4"  (1.626 m)   Wt 250 lb (113.4 kg)   SpO2 92%   BMI 42.91 kg/m  {Vitals History (Optional):23777}  Physical Exam Vitals and nursing note reviewed.  Constitutional:      Appearance: Normal appearance.  HENT:     Head: Normocephalic and atraumatic.  Eyes:     Conjunctiva/sclera: Conjunctivae normal.  Cardiovascular:     Rate and Rhythm: Normal rate and regular rhythm.  Pulmonary:     Effort: Pulmonary effort is normal.     Breath sounds: Normal breath sounds.  Skin:    General: Skin is warm and dry.  Neurological:      Mental Status: She is alert.  Psychiatric:        Mood and Affect: Mood normal.      No results found for any visits on 04/12/23.  {Labs (Optional):23779}  The 10-year ASCVD risk score (Arnett DK, et al., 2019) is: 13.4%    Assessment & Plan:   Problem List Items Addressed This Visit       Cardiovascular and Mediastinum   Chronic heart failure with preserved ejection fraction (HCC)   Other Visit Diagnoses       Dysuria    -  Primary   Relevant Orders   Urinalysis, Routine w reflex microscopic   Urine Culture     General weakness           Return in about 6 weeks (around 05/24/2023) for recheck .    Nani Gasser, MD

## 2023-04-13 ENCOUNTER — Other Ambulatory Visit: Payer: Self-pay

## 2023-04-13 ENCOUNTER — Inpatient Hospital Stay (HOSPITAL_COMMUNITY)
Admission: EM | Admit: 2023-04-13 | Discharge: 2023-04-20 | DRG: 689 | Disposition: A | Discharge status: Skilled Nursing Facility | Attending: Internal Medicine | Admitting: Internal Medicine

## 2023-04-13 ENCOUNTER — Emergency Department (HOSPITAL_COMMUNITY)

## 2023-04-13 ENCOUNTER — Telehealth: Payer: Self-pay

## 2023-04-13 DIAGNOSIS — G9349 Other encephalopathy: Secondary | ICD-10-CM | POA: Diagnosis not present

## 2023-04-13 DIAGNOSIS — B999 Unspecified infectious disease: Secondary | ICD-10-CM

## 2023-04-13 DIAGNOSIS — N3281 Overactive bladder: Secondary | ICD-10-CM | POA: Diagnosis present

## 2023-04-13 DIAGNOSIS — I480 Paroxysmal atrial fibrillation: Secondary | ICD-10-CM | POA: Diagnosis not present

## 2023-04-13 DIAGNOSIS — Z7901 Long term (current) use of anticoagulants: Secondary | ICD-10-CM | POA: Diagnosis not present

## 2023-04-13 DIAGNOSIS — D3161 Benign neoplasm of unspecified site of right orbit: Secondary | ICD-10-CM | POA: Diagnosis not present

## 2023-04-13 DIAGNOSIS — N3001 Acute cystitis with hematuria: Secondary | ICD-10-CM | POA: Diagnosis not present

## 2023-04-13 DIAGNOSIS — I5032 Chronic diastolic (congestive) heart failure: Secondary | ICD-10-CM | POA: Diagnosis not present

## 2023-04-13 DIAGNOSIS — Z8249 Family history of ischemic heart disease and other diseases of the circulatory system: Secondary | ICD-10-CM

## 2023-04-13 DIAGNOSIS — R400 Somnolence: Secondary | ICD-10-CM | POA: Diagnosis not present

## 2023-04-13 DIAGNOSIS — R0689 Other abnormalities of breathing: Secondary | ICD-10-CM | POA: Diagnosis not present

## 2023-04-13 DIAGNOSIS — I10 Essential (primary) hypertension: Secondary | ICD-10-CM | POA: Diagnosis not present

## 2023-04-13 DIAGNOSIS — Z79899 Other long term (current) drug therapy: Secondary | ICD-10-CM

## 2023-04-13 DIAGNOSIS — G4733 Obstructive sleep apnea (adult) (pediatric): Secondary | ICD-10-CM | POA: Diagnosis not present

## 2023-04-13 DIAGNOSIS — M5030 Other cervical disc degeneration, unspecified cervical region: Secondary | ICD-10-CM | POA: Diagnosis not present

## 2023-04-13 DIAGNOSIS — W19XXXA Unspecified fall, initial encounter: Secondary | ICD-10-CM | POA: Diagnosis not present

## 2023-04-13 DIAGNOSIS — G9341 Metabolic encephalopathy: Secondary | ICD-10-CM | POA: Diagnosis not present

## 2023-04-13 DIAGNOSIS — E785 Hyperlipidemia, unspecified: Secondary | ICD-10-CM | POA: Diagnosis present

## 2023-04-13 DIAGNOSIS — E876 Hypokalemia: Secondary | ICD-10-CM | POA: Diagnosis not present

## 2023-04-13 DIAGNOSIS — E871 Hypo-osmolality and hyponatremia: Secondary | ICD-10-CM | POA: Diagnosis not present

## 2023-04-13 DIAGNOSIS — Z6841 Body Mass Index (BMI) 40.0 and over, adult: Secondary | ICD-10-CM

## 2023-04-13 DIAGNOSIS — I110 Hypertensive heart disease with heart failure: Secondary | ICD-10-CM | POA: Diagnosis not present

## 2023-04-13 DIAGNOSIS — R2689 Other abnormalities of gait and mobility: Secondary | ICD-10-CM | POA: Diagnosis not present

## 2023-04-13 DIAGNOSIS — R531 Weakness: Secondary | ICD-10-CM | POA: Diagnosis not present

## 2023-04-13 DIAGNOSIS — F419 Anxiety disorder, unspecified: Secondary | ICD-10-CM | POA: Diagnosis present

## 2023-04-13 DIAGNOSIS — B9620 Unspecified Escherichia coli [E. coli] as the cause of diseases classified elsewhere: Secondary | ICD-10-CM | POA: Diagnosis not present

## 2023-04-13 DIAGNOSIS — E119 Type 2 diabetes mellitus without complications: Secondary | ICD-10-CM | POA: Diagnosis not present

## 2023-04-13 DIAGNOSIS — Z90710 Acquired absence of both cervix and uterus: Secondary | ICD-10-CM | POA: Diagnosis not present

## 2023-04-13 DIAGNOSIS — J45909 Unspecified asthma, uncomplicated: Secondary | ICD-10-CM | POA: Diagnosis not present

## 2023-04-13 DIAGNOSIS — G43909 Migraine, unspecified, not intractable, without status migrainosus: Secondary | ICD-10-CM | POA: Diagnosis not present

## 2023-04-13 DIAGNOSIS — F319 Bipolar disorder, unspecified: Secondary | ICD-10-CM | POA: Diagnosis present

## 2023-04-13 DIAGNOSIS — Z7401 Bed confinement status: Secondary | ICD-10-CM | POA: Diagnosis not present

## 2023-04-13 DIAGNOSIS — E7800 Pure hypercholesterolemia, unspecified: Secondary | ICD-10-CM | POA: Diagnosis not present

## 2023-04-13 DIAGNOSIS — Z8616 Personal history of COVID-19: Secondary | ICD-10-CM | POA: Diagnosis not present

## 2023-04-13 DIAGNOSIS — K219 Gastro-esophageal reflux disease without esophagitis: Secondary | ICD-10-CM | POA: Diagnosis not present

## 2023-04-13 DIAGNOSIS — M170 Bilateral primary osteoarthritis of knee: Secondary | ICD-10-CM | POA: Diagnosis not present

## 2023-04-13 DIAGNOSIS — Z7951 Long term (current) use of inhaled steroids: Secondary | ICD-10-CM | POA: Diagnosis not present

## 2023-04-13 DIAGNOSIS — I4891 Unspecified atrial fibrillation: Secondary | ICD-10-CM | POA: Diagnosis not present

## 2023-04-13 DIAGNOSIS — R404 Transient alteration of awareness: Secondary | ICD-10-CM | POA: Diagnosis not present

## 2023-04-13 DIAGNOSIS — U071 COVID-19: Secondary | ICD-10-CM | POA: Diagnosis not present

## 2023-04-13 DIAGNOSIS — N179 Acute kidney failure, unspecified: Secondary | ICD-10-CM | POA: Diagnosis not present

## 2023-04-13 DIAGNOSIS — J9601 Acute respiratory failure with hypoxia: Secondary | ICD-10-CM | POA: Diagnosis not present

## 2023-04-13 DIAGNOSIS — M6281 Muscle weakness (generalized): Secondary | ICD-10-CM | POA: Diagnosis not present

## 2023-04-13 DIAGNOSIS — G9340 Encephalopathy, unspecified: Secondary | ICD-10-CM | POA: Diagnosis not present

## 2023-04-13 DIAGNOSIS — R609 Edema, unspecified: Secondary | ICD-10-CM | POA: Diagnosis not present

## 2023-04-13 DIAGNOSIS — N390 Urinary tract infection, site not specified: Secondary | ICD-10-CM | POA: Diagnosis not present

## 2023-04-13 DIAGNOSIS — R220 Localized swelling, mass and lump, head: Secondary | ICD-10-CM | POA: Diagnosis not present

## 2023-04-13 DIAGNOSIS — E669 Obesity, unspecified: Secondary | ICD-10-CM | POA: Diagnosis not present

## 2023-04-13 HISTORY — DX: Unspecified infectious disease: B99.9

## 2023-04-13 LAB — CBC WITH DIFFERENTIAL/PLATELET
Abs Immature Granulocytes: 0.24 10*3/uL — ABNORMAL HIGH (ref 0.00–0.07)
Basophils Absolute: 0.1 10*3/uL (ref 0.0–0.1)
Basophils Relative: 1 %
Eosinophils Absolute: 0.2 10*3/uL (ref 0.0–0.5)
Eosinophils Relative: 2 %
HCT: 33.3 % — ABNORMAL LOW (ref 36.0–46.0)
Hemoglobin: 11.6 g/dL — ABNORMAL LOW (ref 12.0–15.0)
Immature Granulocytes: 3 %
Lymphocytes Relative: 15 %
Lymphs Abs: 1.4 10*3/uL (ref 0.7–4.0)
MCH: 33.9 pg (ref 26.0–34.0)
MCHC: 34.8 g/dL (ref 30.0–36.0)
MCV: 97.4 fL (ref 80.0–100.0)
Monocytes Absolute: 1.5 10*3/uL — ABNORMAL HIGH (ref 0.1–1.0)
Monocytes Relative: 17 %
Neutro Abs: 5.7 10*3/uL (ref 1.7–7.7)
Neutrophils Relative %: 62 %
Platelets: 195 10*3/uL (ref 150–400)
RBC: 3.42 MIL/uL — ABNORMAL LOW (ref 3.87–5.11)
RDW: 13.1 % (ref 11.5–15.5)
WBC: 9 10*3/uL (ref 4.0–10.5)
nRBC: 0 % (ref 0.0–0.2)

## 2023-04-13 LAB — URINALYSIS, W/ REFLEX TO CULTURE (INFECTION SUSPECTED)
Glucose, UA: NEGATIVE mg/dL
Ketones, ur: NEGATIVE mg/dL
Nitrite: NEGATIVE
Protein, ur: 100 mg/dL — AB
Specific Gravity, Urine: 1.025 (ref 1.005–1.030)
WBC, UA: >50 WBC/hpf (ref 0–5)
pH: 6 (ref 5.0–8.0)

## 2023-04-13 LAB — TROPONIN I (HIGH SENSITIVITY)
Troponin I (High Sensitivity): 3 ng/L (ref <18)
Troponin I (High Sensitivity): 3 ng/L (ref <18)

## 2023-04-13 LAB — COMPREHENSIVE METABOLIC PANEL WITH GFR
ALT: 14 U/L (ref 0–44)
AST: 20 U/L (ref 15–41)
Albumin: 2.9 g/dL — ABNORMAL LOW (ref 3.5–5.0)
Alkaline Phosphatase: 52 U/L (ref 38–126)
Anion gap: 11 (ref 5–15)
BUN: 15 mg/dL (ref 8–23)
CO2: 25 mmol/L (ref 22–32)
Calcium: 8.9 mg/dL (ref 8.9–10.3)
Chloride: 97 mmol/L — ABNORMAL LOW (ref 98–111)
Creatinine, Ser: 1.07 mg/dL — ABNORMAL HIGH (ref 0.44–1.00)
GFR, Estimated: 55 mL/min — ABNORMAL LOW (ref >60)
Glucose, Bld: 92 mg/dL (ref 70–99)
Potassium: 3.6 mmol/L (ref 3.5–5.1)
Sodium: 133 mmol/L — ABNORMAL LOW (ref 135–145)
Total Bilirubin: 1.3 mg/dL — ABNORMAL HIGH (ref 0.0–1.2)
Total Protein: 5.4 g/dL — ABNORMAL LOW (ref 6.5–8.1)

## 2023-04-13 LAB — AMMONIA: Ammonia: 15 umol/L (ref 9–35)

## 2023-04-13 LAB — I-STAT VENOUS BLOOD GAS, ED
Acid-Base Excess: 6 mmol/L — ABNORMAL HIGH (ref 0.0–2.0)
Bicarbonate: 29.3 mmol/L — ABNORMAL HIGH (ref 20.0–28.0)
Calcium, Ion: 1.03 mmol/L — ABNORMAL LOW (ref 1.15–1.40)
HCT: 33 % — ABNORMAL LOW (ref 36.0–46.0)
Hemoglobin: 11.2 g/dL — ABNORMAL LOW (ref 12.0–15.0)
O2 Saturation: 98 %
Potassium: 3.4 mmol/L — ABNORMAL LOW (ref 3.5–5.1)
Sodium: 131 mmol/L — ABNORMAL LOW (ref 135–145)
TCO2: 30 mmol/L (ref 22–32)
pCO2, Ven: 37 mmHg — ABNORMAL LOW (ref 44–60)
pH, Ven: 7.507 — ABNORMAL HIGH (ref 7.25–7.43)
pO2, Ven: 100 mmHg — ABNORMAL HIGH (ref 32–45)

## 2023-04-13 LAB — CBG MONITORING, ED: Glucose-Capillary: 104 mg/dL — ABNORMAL HIGH (ref 70–99)

## 2023-04-13 LAB — MAGNESIUM: Magnesium: 1.7 mg/dL (ref 1.7–2.4)

## 2023-04-13 LAB — BRAIN NATRIURETIC PEPTIDE: B Natriuretic Peptide: 27.3 pg/mL (ref 0.0–100.0)

## 2023-04-13 LAB — ETHANOL: Alcohol, Ethyl (B): <10 mg/dL (ref <10)

## 2023-04-13 LAB — VALPROIC ACID LEVEL: Valproic Acid Lvl: 100 ug/mL (ref 50.0–100.0)

## 2023-04-13 LAB — TSH: TSH: 3.279 u[IU]/mL (ref 0.350–4.500)

## 2023-04-13 MED ORDER — LACTATED RINGERS IV SOLN: INTRAVENOUS | Status: DC

## 2023-04-13 MED ORDER — ENOXAPARIN SODIUM 60 MG/0.6ML IJ SOSY
55.0000 mg | PREFILLED_SYRINGE | INTRAMUSCULAR | Status: DC
  Administered 2023-04-14: 55 mg via SUBCUTANEOUS
  Filled 2023-04-13 (×2): qty 0.6

## 2023-04-13 MED ORDER — ACETAMINOPHEN 500 MG PO TABS
500.0000 mg | ORAL_TABLET | Freq: Four times a day (QID) | ORAL | Status: DC | PRN
  Administered 2023-04-14 – 2023-04-16 (×3): 500 mg via ORAL
  Filled 2023-04-13 (×3): qty 1

## 2023-04-13 MED ORDER — MELATONIN 3 MG PO TABS
3.0000 mg | ORAL_TABLET | Freq: Every evening | ORAL | Status: DC | PRN
  Administered 2023-04-16: 3 mg via ORAL
  Filled 2023-04-13: qty 1

## 2023-04-13 MED ORDER — ONDANSETRON HCL 4 MG/2ML IJ SOLN
4.0000 mg | Freq: Four times a day (QID) | INTRAMUSCULAR | Status: DC | PRN
  Administered 2023-04-15: 4 mg via INTRAVENOUS
  Filled 2023-04-13: qty 2

## 2023-04-13 MED ORDER — SODIUM CHLORIDE 0.9 % IV SOLN
1.0000 g | INTRAVENOUS | Status: AC
Start: 2023-04-14 — End: 2023-04-17
  Administered 2023-04-14 – 2023-04-17 (×4): 1 g via INTRAVENOUS
  Filled 2023-04-13 (×4): qty 10

## 2023-04-13 MED ORDER — BISACODYL 5 MG PO TBEC: 5.0000 mg | DELAYED_RELEASE_TABLET | Freq: Every day | ORAL | Status: DC | PRN

## 2023-04-13 MED ORDER — ONDANSETRON HCL 4 MG PO TABS: 4.0000 mg | ORAL_TABLET | Freq: Four times a day (QID) | ORAL | Status: DC | PRN

## 2023-04-13 MED ORDER — ACETAMINOPHEN 650 MG RE SUPP: 650.0000 mg | Freq: Four times a day (QID) | RECTAL | Status: DC | PRN

## 2023-04-13 MED ORDER — POTASSIUM CHLORIDE 20 MEQ PO PACK
40.0000 meq | PACK | Freq: Once | ORAL | Status: AC
  Administered 2023-04-13: 40 meq via ORAL
  Filled 2023-04-13: qty 2

## 2023-04-13 MED ORDER — SODIUM CHLORIDE 0.9 % IV SOLN
2.0000 g | Freq: Once | INTRAVENOUS | Status: AC
  Administered 2023-04-13: 2 g via INTRAVENOUS
  Filled 2023-04-13: qty 20

## 2023-04-13 NOTE — Telephone Encounter (Signed)
 Copied from CRM [id number]. Topic: Clinical - Home Health Verbal Orders >> Apr 13, 2023  9:02 AM Maxwell Marion wrote: Caller/Agency: Tresa Endo with [facility] Callback Number: [phone] Service Requested: Evaluation for Speech Therapy Frequency: N/A Any new concerns about the patient? No

## 2023-04-13 NOTE — Telephone Encounter (Signed)
 K for ST

## 2023-04-13 NOTE — ED Provider Notes (Addendum)
 [place] EMERGENCY DEPARTMENT AT [facility] Provider Note   CSN: [id number] Arrival date & time: 04/13/23  1706     History  Chief Complaint  Patient presents with   Fatigue    Donna Sexton is a 73 y.o. female.  Pt is a 73y/o female with hx of CHF with perserved EF, DM-2, bipolar disorder, HTN, morbid obesity and HLD with recent hospitalization in Feb for Sepsis, covid, hypoxia, afib rvr who then went to rehab for 4 weeks and was d/ced home 8 days ago due to insurance no longer paying and presenting today with EMS due to worsening confusion, lethargy and weakness over the last few days.  Patient's ex-husband been gives the story as patient admittedly reports she is confused.  He reports that initially when she came home from rehab she was doing pretty well even though she had been nonambulatory and would just transfer from bed to wheelchair.  He reports however over the last 8 days things have just been going downhill.  She continues to complain of feeling weak and appears short of breath when she does even minimal activity.  He also reports now she is sleeping all the time despite no changes in her medications.  He has not noticed a new cough or congestion.  She did complain of some mild nausea but said it was not too bad and denied a headache.  He reports that she has not had a bowel movement in the last 5 days and did receive a dose of MiraLAX today but has not had any stool but prior had had some diarrhea at rehab before she was discharged.  She is not currently on any antibiotics but saw her doctor yesterday for these worsening symptoms and the urine was ordered but they were never able to collect it.  Her family member also reports that she had a fall yesterday when she was pivoting to the toilet and went down hard on her tailbone but did not have any other injuries.  She has not hit her head.  She is currently on Eliquis and amiodarone for more recent diagnosis of atrial  fibrillation.  He also reports she has been eating very little.  The history is provided by the patient, a relative, the EMS personnel and medical records.       Home Medications Prior to Admission medications   Medication Sig Start Date End Date Taking? Authorizing Provider  acetaminophen (TYLENOL) 500 MG tablet Take 1 tablet (500 mg total) by mouth every 6 (six) hours as needed for mild pain (pain score 1-3) (or Fever >/= 101). 03/10/23   Rai, Ripudeep K, MD  amiodarone (PACERONE) 200 MG tablet Take 1 tablet (200 mg total) by mouth daily. 03/11/23   Rai, Delene Ruffini, MD  apixaban (ELIQUIS) 5 MG TABS tablet Take 1 tablet (5 mg total) by mouth 2 (two) times daily. 03/10/23   Rai, Delene Ruffini, MD  atorvastatin (LIPITOR) 40 MG tablet Take 1 tablet (40 mg total) by mouth daily. 05/09/22   Agapito Games, MD  benzoyl peroxide 5 % external liquid Apply topically 2 (two) times daily. Please help her find over-the-counter if not covered by the insurance.  If not available please let us know we can see if the insurance will cover BenzaClin topical. 05/18/21   Agapito Games, MD  diltiazem (CARDIZEM CD) 180 MG 24 hr capsule Take 1 capsule (180 mg total) by mouth daily. 03/21/23   Eustace Pen, PA-C  divalproex (DEPAKOTE ER) 500 MG 24 hr tablet Take 3 tablets (1,500 mg total) by mouth at bedtime. 09/01/22   Cottle, Steva Ready., MD  famotidine (PEPCID) 20 MG tablet Take 20 mg by mouth daily.    [provider]  furosemide (LASIX) 80 MG tablet TAKE 80 MG DAILY. MAY TAKE AN ADDITIONAL HALF TO WHOLE TABLET IF YOU GAIN 3 POUNDS OR MORE OVER NIGHT OR 5 POUNDS OR MORE IN ONE WEEK. 04/05/23   Denyce Robert, NP  hydrOXYzine (ATARAX) 25 MG tablet Take 1 tablet (25 mg total) by mouth 3 (three) times daily as needed for itching. 03/10/23   Rai, Delene Ruffini, MD  levalbuterol (XOPENEX) 0.63 MG/3ML nebulizer solution Take 3 mLs (0.63 mg total) by nebulization every 6 (six) hours as needed for wheezing  or shortness of breath. 03/10/23   Rai, Delene Ruffini, MD  metoprolol tartrate (LOPRESSOR) 25 MG tablet Take 1 tablet (25 mg total) by mouth 2 (two) times daily. 03/21/23 06/19/23  Eustace Pen, PA-C  mometasone-formoterol (DULERA) 100-5 MCG/ACT AERO Inhale 2 puffs into the lungs 2 (two) times daily. 03/10/23   Rai, Delene Ruffini, MD  oxybutynin (DITROPAN) 5 MG tablet Take 1 tablet (5 mg total) by mouth 2 (two) times daily. 09/29/22   Agapito Games, MD  polyethylene glycol (MIRALAX / GLYCOLAX) 17 g packet Take 17 g by mouth daily. 03/10/23   Rai, Ripudeep K, MD  potassium chloride SA (KLOR-CON M) 20 MEQ tablet Take 1 tablet (20 mEq total) by mouth 2 (two) times daily. Patient taking differently: Take 20 mEq by mouth daily. 03/21/23   Eustace Pen, PA-C  pregabalin (LYRICA) 50 MG capsule Take 1 capsule (50 mg total) by mouth 3 (three) times daily. 12/30/22   Agapito Games, MD  promethazine (PHENERGAN) 25 MG tablet TAKE ONE TABLET BY MOUTH EVERY 8 HOURS AS NEEDED FOR NAUSEA AND VOMITING 02/08/23   Agapito Games, MD  senna-docusate (SENOKOT-S) 8.6-50 MG tablet Take 2 tablets by mouth at bedtime. 03/10/23   Rai, Delene Ruffini, MD      Allergies    Morphine and Other    Review of Systems   Review of Systems  Physical Exam Updated Vital Signs BP 122/72   Pulse 73   Temp 98.2 F (36.8 C)   Resp 15   SpO2 97%  Physical Exam Vitals and nursing note reviewed.  Constitutional:      General: She is not in acute distress.    Appearance: She is well-developed.     Comments: Patient is groggy but does wake up easily  HENT:     Head: Normocephalic and atraumatic.     Mouth/Throat:     Mouth: Mucous membranes are dry.  Eyes:     Pupils: Pupils are equal, round, and reactive to light.  Cardiovascular:     Rate and Rhythm: Normal rate and regular rhythm.     Heart sounds: Normal heart sounds. No murmur heard.    No friction rub.  Pulmonary:     Effort: Pulmonary effort is normal.      Breath sounds: Normal breath sounds. No wheezing or rales.     Comments: Decreased breath sounds at the bases most likely related to effort Abdominal:     General: Bowel sounds are normal. There is no distension.     Palpations: Abdomen is soft.     Tenderness: There is no abdominal tenderness. There is no guarding or rebound.  Musculoskeletal:  General: No tenderness. Normal range of motion.     Right lower leg: Edema present.     Left lower leg: Edema present.     Comments: No edema  Skin:    General: Skin is warm and dry.     Findings: No rash.  Neurological:     Cranial Nerves: Cranial nerve deficit present.     Sensory: No sensory deficit.     Motor: Weakness present.     Comments: Patient seems to more constantly look towards the right and initially when asking to lift her arm she only lifts her right arm and then with persistent questioning she finally will lift her left arm but does not do that initially.  There is tremor and weakness noted to the left upper extremity without pronator drift.  Left lower extremity slightly weaker than the right lower extremity.  Right upper extremity 5 out of 5 strength.  Also concern for mild left-sided facial droop.  Patient also has some word finding problems concerning for expressive aphasia.  Oriented to person and place  Psychiatric:        Behavior: Behavior normal.     ED Results / Procedures / Treatments   Labs (all labs ordered are listed, but only abnormal results are displayed) Labs Reviewed  CBC WITH DIFFERENTIAL/PLATELET - Abnormal; Notable for the following components:      Result Value   RBC 3.42 (*)    Hemoglobin 11.6 (*)    HCT 33.3 (*)    Monocytes Absolute 1.5 (*)    Abs Immature Granulocytes 0.24 (*)    All other components within normal limits  COMPREHENSIVE METABOLIC PANEL WITH GFR - Abnormal; Notable for the following components:   Sodium 133 (*)    Chloride 97 (*)    Creatinine, Ser 1.07 (*)    Total  Protein 5.4 (*)    Albumin 2.9 (*)    Total Bilirubin 1.3 (*)    GFR, Estimated 55 (*)    All other components within normal limits  URINALYSIS, W/ REFLEX TO CULTURE (INFECTION SUSPECTED) - Abnormal; Notable for the following components:   Hgb urine dipstick LARGE (*)    Bilirubin Urine SMALL (*)    Protein, ur 100 (*)    Leukocytes,Ua MODERATE (*)    Bacteria, UA MANY (*)    All other components within normal limits  CBG MONITORING, ED - Abnormal; Notable for the following components:   Glucose-Capillary 104 (*)    All other components within normal limits  I-STAT VENOUS BLOOD GAS, ED - Abnormal; Notable for the following components:   pH, Ven 7.507 (*)    pCO2, Ven 37.0 (*)    pO2, Ven 100 (*)    Bicarbonate 29.3 (*)    Acid-Base Excess 6.0 (*)    Sodium 131 (*)    Potassium 3.4 (*)    Calcium, Ion 1.03 (*)    HCT 33.0 (*)    Hemoglobin 11.2 (*)    All other components within normal limits  URINE CULTURE  BRAIN NATRIURETIC PEPTIDE  AMMONIA  ETHANOL  MAGNESIUM  TSH  VALPROIC ACID LEVEL  TROPONIN I (HIGH SENSITIVITY)  TROPONIN I (HIGH SENSITIVITY)    EKG EKG Interpretation Date/Time:  Thursday April 13 2023 17:18:14 EDT Ventricular Rate:  74 PR Interval:  170 QRS Duration:  94 QT Interval:  429 QTC Calculation: 476 R Axis:   -2  Text Interpretation: Sinus rhythm Atrial premature complex new Nonspecific T abnormalities, diffuse leads Confirmed  by Gwyneth Sprout ([id number]) on 04/13/2023 5:46:37 PM  Radiology CT Head Wo Contrast Result Date: 04/13/2023 CLINICAL DATA:  Altered level of consciousness, lethargy, somnolent EXAM: CT HEAD WITHOUT CONTRAST TECHNIQUE: Contiguous axial images were obtained from the base of the skull through the vertex without intravenous contrast. RADIATION DOSE REDUCTION: This exam was performed according to the departmental dose-optimization program which includes automated exposure control, adjustment of the mA and/or kV according to patient  size and/or use of iterative reconstruction technique. COMPARISON:  03/01/2023 FINDINGS: Brain: No acute infarct or hemorrhage. Lateral ventricles and midline structures are stable. No acute extra-axial fluid collections. No mass effect. Vascular: No hyperdense vessel or unexpected calcification. Skull: Normal. Negative for fracture or focal lesion. Sinuses/Orbits: Mild mucosal thickening within the ethmoid air cells. The remaining paranasal sinuses are clear. Other: None. IMPRESSION: 1. Stable head CT, no acute intracranial process. Electronically Signed   By: Sharlet Salina M.D.   On: 04/13/2023 20:03   DG Chest Port 1 View Result Date: 04/13/2023 CLINICAL DATA:  Weakness EXAM: PORTABLE CHEST 1 VIEW COMPARISON:  Chest x-ray 03/04/2023 FINDINGS: The heart size and mediastinal contours are within normal limits. Both lungs are clear. The visualized skeletal structures are unremarkable. IMPRESSION: No active disease. Electronically Signed   By: Darliss Cheney M.D.   On: 04/13/2023 17:59    Procedures Procedures    Medications Ordered in ED Medications  lactated ringers infusion (has no administration in time range)  cefTRIAXone (ROCEPHIN) 2 g in sodium chloride 0.9 % 100 mL IVPB (has no administration in time range)    ED Course/ Medical Decision Making/ A&P                                 Medical Decision Making Amount and/or Complexity of Data Reviewed Independent Historian: spouse and EMS External Data Reviewed: notes. Labs: ordered. Decision-making details documented in ED Course. Radiology: ordered and independent interpretation performed. Decision-making details documented in ED Course. ECG/medicine tests: ordered and independent interpretation performed. Decision-making details documented in ED Course.  Risk Prescription drug management. Decision regarding hospitalization.   Pt with multiple medical problems and comorbidities and presenting today with a complaint that caries a  high risk for morbidity and mortality.  Here today with multiple complaints.  Concern for possible medication side effects such as elevated Depakote level versus stroke versus infectious etiology versus AKI and uremia versus hyperammonemia versus hypercarbia versus CHF versus metabolic encephalopathy from another cause versus hypothyroidism given recent initiation of amiodarone.  Patient's TSH prior to amiodarone was normal but will recheck today.  Low suspicion for MI.  Lower suspicion for PE as patient is already anticoagulated but is very immobile.  9:22 PM I independently interpreted patient's labs and EKG.  EKG without acute findings, troponin, EtOH, CBC all without acute findings, CMP with new AKI with creatinine of 1.07 from her baseline of 0.7 with normal LFTs and anion gap, ammonia is negative, valproic acid is within normal range, troponin, magnesium and TSH are all within normal limits.  I have independently visualized and interpreted pt's images today. Head CT without evidence of bleed and radiology reports stable head CT with no acute process, chest x-ray within normal limits. Patient's urine was obtained and is purulent and cloudy.  UA is consistent with a UTI with greater than 50 white cells, many bacteria.  Based on prior cultures she has been positive for E. coli which is pansensitive  except to amoxicillin and Bactrim.  Given patient's change in mental status, generalized weakness and somnolence as well as encephalopathy will admit for treatment for UTI.  She was given Rocephin and cultures are pending.         Final Clinical Impression(s) / ED Diagnoses Final diagnoses:  Acute cystitis with hematuria  Acute metabolic encephalopathy    Rx / DC Orders ED Discharge Orders     None         Gwyneth Sprout, MD 04/13/23 2121    Gwyneth Sprout, MD 04/13/23 2122

## 2023-04-13 NOTE — ED Triage Notes (Addendum)
 Patient BIB [facility] from home for lethargy, Sleeping more than usual. Responds to voice. CBG 119

## 2023-04-14 ENCOUNTER — Encounter: Payer: Self-pay | Admitting: Family Medicine

## 2023-04-14 ENCOUNTER — Inpatient Hospital Stay (HOSPITAL_COMMUNITY)

## 2023-04-14 ENCOUNTER — Encounter (HOSPITAL_COMMUNITY): Payer: Self-pay | Admitting: Internal Medicine

## 2023-04-14 DIAGNOSIS — N3001 Acute cystitis with hematuria: Secondary | ICD-10-CM | POA: Diagnosis not present

## 2023-04-14 DIAGNOSIS — B999 Unspecified infectious disease: Secondary | ICD-10-CM

## 2023-04-14 DIAGNOSIS — G9349 Other encephalopathy: Secondary | ICD-10-CM

## 2023-04-14 DIAGNOSIS — R251 Tremor, unspecified: Secondary | ICD-10-CM | POA: Insufficient documentation

## 2023-04-14 DIAGNOSIS — E669 Obesity, unspecified: Secondary | ICD-10-CM | POA: Diagnosis not present

## 2023-04-14 DIAGNOSIS — Z711 Person with feared health complaint in whom no diagnosis is made: Secondary | ICD-10-CM | POA: Insufficient documentation

## 2023-04-14 LAB — BASIC METABOLIC PANEL WITH GFR
Anion gap: 12 (ref 5–15)
BUN: 13 mg/dL (ref 8–23)
CO2: 25 mmol/L (ref 22–32)
Calcium: 8.9 mg/dL (ref 8.9–10.3)
Chloride: 98 mmol/L (ref 98–111)
Creatinine, Ser: 0.84 mg/dL (ref 0.44–1.00)
GFR, Estimated: >60 mL/min (ref >60)
Glucose, Bld: 88 mg/dL (ref 70–99)
Potassium: 3.5 mmol/L (ref 3.5–5.1)
Sodium: 135 mmol/L (ref 135–145)

## 2023-04-14 LAB — CBC
HCT: 33 % — ABNORMAL LOW (ref 36.0–46.0)
Hemoglobin: 11.3 g/dL — ABNORMAL LOW (ref 12.0–15.0)
MCH: 33.3 pg (ref 26.0–34.0)
MCHC: 34.2 g/dL (ref 30.0–36.0)
MCV: 97.3 fL (ref 80.0–100.0)
Platelets: 166 10*3/uL (ref 150–400)
RBC: 3.39 MIL/uL — ABNORMAL LOW (ref 3.87–5.11)
RDW: 12.9 % (ref 11.5–15.5)
WBC: 8.3 10*3/uL (ref 4.0–10.5)
nRBC: 0 % (ref 0.0–0.2)

## 2023-04-14 LAB — TSH: TSH: 3.291 u[IU]/mL (ref 0.350–4.500)

## 2023-04-14 LAB — MAGNESIUM: Magnesium: 1.7 mg/dL (ref 1.7–2.4)

## 2023-04-14 MED ORDER — APIXABAN 5 MG PO TABS
5.0000 mg | ORAL_TABLET | Freq: Two times a day (BID) | ORAL | Status: DC
  Administered 2023-04-14 – 2023-04-20 (×13): 5 mg via ORAL
  Filled 2023-04-14 (×13): qty 1

## 2023-04-14 MED ORDER — SENNOSIDES-DOCUSATE SODIUM 8.6-50 MG PO TABS
2.0000 | ORAL_TABLET | Freq: Every day | ORAL | Status: DC
  Administered 2023-04-14 – 2023-04-16 (×4): 2 via ORAL
  Filled 2023-04-14 (×6): qty 2

## 2023-04-14 MED ORDER — DILTIAZEM HCL ER COATED BEADS 180 MG PO CP24
180.0000 mg | ORAL_CAPSULE | Freq: Every day | ORAL | Status: DC
Start: 2023-04-14 — End: 2023-04-20
  Administered 2023-04-14 – 2023-04-20 (×7): 180 mg via ORAL
  Filled 2023-04-14 (×8): qty 1

## 2023-04-14 MED ORDER — FUROSEMIDE 40 MG PO TABS
80.0000 mg | ORAL_TABLET | Freq: Every day | ORAL | Status: DC
  Administered 2023-04-14 – 2023-04-20 (×7): 80 mg via ORAL
  Filled 2023-04-14 (×8): qty 2

## 2023-04-14 MED ORDER — PREGABALIN 25 MG PO CAPS
50.0000 mg | ORAL_CAPSULE | Freq: Every day | ORAL | Status: DC
  Administered 2023-04-14 – 2023-04-19 (×6): 50 mg via ORAL
  Filled 2023-04-14 (×6): qty 2

## 2023-04-14 MED ORDER — POLYETHYLENE GLYCOL 3350 17 G PO PACK
17.0000 g | PACK | Freq: Every day | ORAL | Status: DC
  Administered 2023-04-14 – 2023-04-16 (×3): 17 g via ORAL
  Filled 2023-04-14 (×4): qty 1

## 2023-04-14 MED ORDER — AMIODARONE HCL 200 MG PO TABS
200.0000 mg | ORAL_TABLET | Freq: Every day | ORAL | Status: DC
  Administered 2023-04-14 – 2023-04-20 (×7): 200 mg via ORAL
  Filled 2023-04-14 (×8): qty 1

## 2023-04-14 MED ORDER — METOPROLOL TARTRATE 25 MG PO TABS
25.0000 mg | ORAL_TABLET | Freq: Two times a day (BID) | ORAL | Status: DC
  Administered 2023-04-14 – 2023-04-20 (×13): 25 mg via ORAL
  Filled 2023-04-14 (×13): qty 1

## 2023-04-14 MED ORDER — FAMOTIDINE 20 MG PO TABS
20.0000 mg | ORAL_TABLET | Freq: Every day | ORAL | Status: DC
  Administered 2023-04-14 – 2023-04-20 (×7): 20 mg via ORAL
  Filled 2023-04-14 (×7): qty 1

## 2023-04-14 MED ORDER — ATORVASTATIN CALCIUM 40 MG PO TABS
40.0000 mg | ORAL_TABLET | Freq: Every day | ORAL | Status: DC
  Administered 2023-04-14 – 2023-04-20 (×7): 40 mg via ORAL
  Filled 2023-04-14 (×8): qty 1

## 2023-04-14 MED ORDER — GADOBUTROL 1 MMOL/ML IV SOLN
8.0000 mL | Freq: Once | INTRAVENOUS | Status: AC | PRN
  Administered 2023-04-14: 8 mL via INTRAVENOUS

## 2023-04-14 MED ORDER — OXYBUTYNIN CHLORIDE 5 MG PO TABS
5.0000 mg | ORAL_TABLET | Freq: Two times a day (BID) | ORAL | Status: DC
  Administered 2023-04-14 – 2023-04-20 (×14): 5 mg via ORAL
  Filled 2023-04-14 (×14): qty 1

## 2023-04-14 NOTE — H&P (Addendum)
 History and Physical    Patient: Donna Sexton OZH:[id number] DOB: Oct 31, 1950 DOA: 04/13/2023 DOS: the patient was seen and examined on 04/14/2023 PCP: Agapito Games, MD  Patient coming from: Home  Chief Complaint:  Chief Complaint  Patient presents with   Fatigue   HPI: [name] is a 73 y.o. female with medical history significant for atrial fibrillation, OSA, severe sepsis and COVID 2 months ago who is brought in from home because the patient has been sleeping a lot and more confused.  She was hospitalized 2 months ago for severe sepsis and COVID.  After that hospital stay she went to rehab but never regained the ability to walk.  She is cared for at home during the day by her ex-husband and sometimes.  Paid caregivers at night.  My history comes from the emergency department provider. Her evaluation in the emergency department found urine that looked like "pond water".  Review of Systems: As mentioned in the history of present illness. All other systems reviewed and are negative. Past Medical History:  Diagnosis Date   ANEMIA 11/12/2007   Qualifier: Diagnosis of  By: Briscoe Burns CMA, Alvy Beal     Anxiety    Arthritis    ASTHMA NOS W/ACUTE EXACERBATION 01/13/2010   Qualifier: Diagnosis of  By: Yetta Barre MD, Bernadene Bell.    BIPOLAR DISORDER UNSPECIFIED 10/29/2007   Qualifier: Diagnosis of  By: Yetta Barre MD, Bernadene Bell.    COMMON MIGRAINE 12/29/2008   Qualifier: Diagnosis of  By: Yetta Barre MD, Bernadene Bell.    GERD 07/28/2009   Qualifier: Diagnosis of  By: Felicity Coyer MD, Raenette Rover    HYPERLIPIDEMIA 10/29/2007   Qualifier: Diagnosis of  By: Yetta Barre MD, Bernadene Bell.    HYPERTENSION, BENIGN ESSENTIAL 10/29/2007   Qualifier: Diagnosis of  By: Yetta Barre MD, Bernadene Bell.    OSTEOPENIA 10/30/2007   Qualifier: Diagnosis of  By: Yetta Barre MD, Bernadene Bell.    OVERACTIVE BLADDER 02/21/2008   Qualifier: Diagnosis of  By: Jonny Ruiz MD, Len Blalock    SLEEP APNEA 12/31/2009   Qualifier: Diagnosis of  By: Yetta Barre MD, Bernadene Bell.    Past  Surgical History:  Procedure Laterality Date   ABDOMINAL HYSTERECTOMY     BREAST IMPLANT REMOVAL Bilateral 2022   BREAST SURGERY  01/10/1978   implants bilateral   CARPAL TUNNEL RELEASE     CHOLECYSTECTOMY     HERNIA REPAIR     PAROTID ENDOSCOPY     TUBAL LIGATION     Social History:  reports that she has never smoked. She has never used smokeless tobacco. She reports that she does not drink alcohol and does not use drugs.  Allergies  Allergen Reactions   Other Other (See Comments)    Allergic to muscle relaxers -unknown reaction Flexeril only can take cyclobenzaprine.   Morphine Nausea And Vomiting    Other reaction(s): Other (See Comments) Unspecified reaction     Family History  Problem Relation Age of Onset   Heart attack Mother    Hypertension Mother    Heart failure Mother    Heart attack Father    Hypertension Father    Diabetes Father    Heart failure Brother    Alcohol abuse Maternal Uncle    Breast cancer Neg Hx     Prior to Admission medications   Medication Sig Start Date End Date Taking? Authorizing Provider  acetaminophen (TYLENOL) 500 MG tablet Take 1 tablet (500 mg total) by mouth every 6 (six) hours as needed  for mild pain (pain score 1-3) (or Fever >/= 101). 03/10/23  Yes Rai, Ripudeep K, MD  amiodarone (PACERONE) 200 MG tablet Take 1 tablet (200 mg total) by mouth daily. 03/11/23  Yes Rai, Ripudeep K, MD  apixaban (ELIQUIS) 5 MG TABS tablet Take 1 tablet (5 mg total) by mouth 2 (two) times daily. 03/10/23  Yes Rai, Ripudeep K, MD  atorvastatin (LIPITOR) 40 MG tablet Take 1 tablet (40 mg total) by mouth daily. 05/09/22  Yes Agapito Games, MD  diltiazem (CARDIZEM CD) 180 MG 24 hr capsule Take 1 capsule (180 mg total) by mouth daily. 03/21/23  Yes Eustace Pen, PA-C  divalproex (DEPAKOTE ER) 500 MG 24 hr tablet Take 3 tablets (1,500 mg total) by mouth at bedtime. 09/01/22  Yes Cottle, Steva Ready., MD  famotidine (PEPCID) 20 MG tablet Take 20 mg by  mouth daily.   Yes [provider]  furosemide (LASIX) 80 MG tablet TAKE 80 MG DAILY. MAY TAKE AN ADDITIONAL HALF TO WHOLE TABLET IF YOU GAIN 3 POUNDS OR MORE OVER NIGHT OR 5 POUNDS OR MORE IN ONE WEEK. 04/05/23  Yes Fountain, Madison L, NP  hydrOXYzine (ATARAX) 25 MG tablet Take 1 tablet (25 mg total) by mouth 3 (three) times daily as needed for itching. 03/10/23  Yes Rai, Ripudeep K, MD  levalbuterol (XOPENEX) 0.63 MG/3ML nebulizer solution Take 3 mLs (0.63 mg total) by nebulization every 6 (six) hours as needed for wheezing or shortness of breath. 03/10/23  Yes Rai, Ripudeep K, MD  metoprolol tartrate (LOPRESSOR) 25 MG tablet Take 1 tablet (25 mg total) by mouth 2 (two) times daily. 03/21/23 06/19/23 Yes Eustace Pen, PA-C  mometasone-formoterol (DULERA) 100-5 MCG/ACT AERO Inhale 2 puffs into the lungs 2 (two) times daily. 03/10/23  Yes Rai, Ripudeep K, MD  oxybutynin (DITROPAN) 5 MG tablet Take 1 tablet (5 mg total) by mouth 2 (two) times daily. 09/29/22  Yes Agapito Games, MD  polyethylene glycol (MIRALAX / GLYCOLAX) 17 g packet Take 17 g by mouth daily. 03/10/23  Yes Rai, Ripudeep K, MD  potassium chloride SA (KLOR-CON M) 20 MEQ tablet Take 1 tablet (20 mEq total) by mouth 2 (two) times daily. Patient taking differently: Take 20 mEq by mouth daily. 03/21/23  Yes Eustace Pen, PA-C  pregabalin (LYRICA) 50 MG capsule Take 1 capsule (50 mg total) by mouth 3 (three) times daily. 12/30/22  Yes Agapito Games, MD  promethazine (PHENERGAN) 25 MG tablet TAKE ONE TABLET BY MOUTH EVERY 8 HOURS AS NEEDED FOR NAUSEA AND VOMITING 02/08/23  Yes Agapito Games, MD  senna-docusate (SENOKOT-S) 8.6-50 MG tablet Take 2 tablets by mouth at bedtime. 03/10/23  Yes Cathren Harsh, MD    Physical Exam: Vitals:   04/13/23 2230 04/13/23 2330 04/13/23 2345 04/14/23 0000  BP: 107/64 127/68 119/75 117/69  Pulse: 71 71 69 70  Resp: (!) 29 17 17  (!) 21  Temp:      SpO2: 91% 99% 95% 91%    Physical Exam:  General: Chronically ill appearing woman HEENT: Normocephalic, atraumatic, PERRL Cardiovascular: Normal rate and rhythm. Distal pulses intact. Pulmonary: Normal pulmonary effort, normal breath sounds Gastrointestinal: Distended abdomen, soft, non-tender, normoactive bowel sounds Musculoskeletal:1+ lower ext edema Skin: Skin is warm and dry. Neuro: appropriate, generalized weakness, AAOx2 PSYCH: Attentive and cooperative  Data Reviewed:  Results for orders placed or performed during the hospital encounter of 04/13/23 (from the past 24 hours)  CBG monitoring, ED  Status: Abnormal   Collection Time: 04/13/23  5:25 PM  Result Value Ref Range   Glucose-Capillary 104 (H) 70 - 99 mg/dL   Comment 1 Notify RN    Comment 2 Document in Chart   CBC with Differential/Platelet     Status: Abnormal   Collection Time: 04/13/23  5:50 PM  Result Value Ref Range   WBC 9.0 4.0 - 10.5 K/uL   RBC 3.42 (L) 3.87 - 5.11 MIL/uL   Hemoglobin 11.6 (L) 12.0 - 15.0 g/dL   HCT 14.7 (L) 82.9 - 56.2 %   MCV 97.4 80.0 - 100.0 fL   MCH 33.9 26.0 - 34.0 pg   MCHC 34.8 30.0 - 36.0 g/dL   RDW 13.0 86.5 - 78.4 %   Platelets 195 150 - 400 K/uL   nRBC 0.0 0.0 - 0.2 %   Neutrophils Relative % 62 %   Neutro Abs 5.7 1.7 - 7.7 K/uL   Lymphocytes Relative 15 %   Lymphs Abs 1.4 0.7 - 4.0 K/uL   Monocytes Relative 17 %   Monocytes Absolute 1.5 (H) 0.1 - 1.0 K/uL   Eosinophils Relative 2 %   Eosinophils Absolute 0.2 0.0 - 0.5 K/uL   Basophils Relative 1 %   Basophils Absolute 0.1 0.0 - 0.1 K/uL   Immature Granulocytes 3 %   Abs Immature Granulocytes 0.24 (H) 0.00 - 0.07 K/uL  Brain natriuretic peptide     Status: None   Collection Time: 04/13/23  5:50 PM  Result Value Ref Range   B Natriuretic Peptide 27.3 0.0 - 100.0 pg/mL  Comprehensive metabolic panel with GFR     Status: Abnormal   Collection Time: 04/13/23  5:50 PM  Result Value Ref Range   Sodium 133 (L) 135 - 145 mmol/L   Potassium  3.6 3.5 - 5.1 mmol/L   Chloride 97 (L) 98 - 111 mmol/L   CO2 25 22 - 32 mmol/L   Glucose, Bld 92 70 - 99 mg/dL   BUN 15 8 - 23 mg/dL   Creatinine, Ser 6.96 (H) 0.44 - 1.00 mg/dL   Calcium 8.9 8.9 - 29.5 mg/dL   Total Protein 5.4 (L) 6.5 - 8.1 g/dL   Albumin 2.9 (L) 3.5 - 5.0 g/dL   AST 20 15 - 41 U/L   ALT 14 0 - 44 U/L   Alkaline Phosphatase 52 38 - 126 U/L   Total Bilirubin 1.3 (H) 0.0 - 1.2 mg/dL   GFR, Estimated 55 (L) >60 mL/min   Anion gap 11 5 - 15  Ammonia     Status: None   Collection Time: 04/13/23  5:50 PM  Result Value Ref Range   Ammonia 15 9 - 35 umol/L  Troponin I (High Sensitivity)     Status: None   Collection Time: 04/13/23  5:50 PM  Result Value Ref Range   Troponin I (High Sensitivity) 3 <18 ng/L  Magnesium     Status: None   Collection Time: 04/13/23  5:50 PM  Result Value Ref Range   Magnesium 1.7 1.7 - 2.4 mg/dL  TSH     Status: None   Collection Time: 04/13/23  5:50 PM  Result Value Ref Range   TSH 3.279 0.350 - 4.500 uIU/mL  Valproic acid level     Status: None   Collection Time: 04/13/23  5:50 PM  Result Value Ref Range   Valproic Acid Lvl 100 50.0 - 100.0 ug/mL  I-Stat venous blood gas, ED  Status: Abnormal   Collection Time: 04/13/23  6:29 PM  Result Value Ref Range   pH, Ven 7.507 (H) 7.25 - 7.43   pCO2, Ven 37.0 (L) 44 - 60 mmHg   pO2, Ven 100 (H) 32 - 45 mmHg   Bicarbonate 29.3 (H) 20.0 - 28.0 mmol/L   TCO2 30 22 - 32 mmol/L   O2 Saturation 98 %   Acid-Base Excess 6.0 (H) 0.0 - 2.0 mmol/L   Sodium 131 (L) 135 - 145 mmol/L   Potassium 3.4 (L) 3.5 - 5.1 mmol/L   Calcium, Ion 1.03 (L) 1.15 - 1.40 mmol/L   HCT 33.0 (L) 36.0 - 46.0 %   Hemoglobin 11.2 (L) 12.0 - 15.0 g/dL   Sample type VENOUS   Ethanol     Status: None   Collection Time: 04/13/23  7:26 PM  Result Value Ref Range   Alcohol, Ethyl (B) <10 <10 mg/dL  Troponin I (High Sensitivity)     Status: None   Collection Time: 04/13/23  7:27 PM  Result Value Ref Range   Troponin  I (High Sensitivity) 3 <18 ng/L  Urinalysis, w/ Reflex to Culture (Infection Suspected) -Urine, Clean Catch     Status: Abnormal   Collection Time: 04/13/23  8:07 PM  Result Value Ref Range   Specimen Source URINE, CLEAN CATCH    Color, Urine YELLOW YELLOW   APPearance CLEAR CLEAR   Specific Gravity, Urine 1.025 1.005 - 1.030   pH 6.0 5.0 - 8.0   Glucose, UA NEGATIVE NEGATIVE mg/dL   Hgb urine dipstick LARGE (A) NEGATIVE   Bilirubin Urine SMALL (A) NEGATIVE   Ketones, ur NEGATIVE NEGATIVE mg/dL   Protein, ur 259 (A) NEGATIVE mg/dL   Nitrite NEGATIVE NEGATIVE   Leukocytes,Ua MODERATE (A) NEGATIVE   Squamous Epithelial / HPF 0-5 0 - 5 /HPF   WBC, UA >50 0 - 5 WBC/hpf   RBC / HPF 6-10 0 - 5 RBC/hpf   Bacteria, UA MANY (A) NONE SEEN     Assessment and Plan: Acute encephalopathy Possible UTI - IV Rocephin - Monitor -Consider MRI if patient does not improve   Advance Care Planning:   Code Status: Full Code  The patient names her ex-husband's as her surrogate decision maker and wants to be full code.  Consults: none  Family Communication: none  Severity of Illness: The appropriate patient status for this patient is INPATIENT. Inpatient status is judged to be reasonable and necessary in order to provide the required intensity of service to ensure the patient's safety. The patient's presenting symptoms, physical exam findings, and initial radiographic and laboratory data in the context of their chronic comorbidities is felt to place them at high risk for further clinical deterioration. Furthermore, it is not anticipated that the patient will be medically stable for discharge from the hospital within 2 midnights of admission.   * I certify that at the point of admission it is my clinical judgment that the patient will require inpatient hospital care spanning beyond 2 midnights from the point of admission due to high intensity of service, high risk for further deterioration and high  frequency of surveillance required.*  Author: Buena Irish, MD 04/14/2023 12:36 AM  For on call review [email or web address].

## 2023-04-14 NOTE — Progress Notes (Signed)
 PROGRESS NOTE  Donna Sexton  DOB: 1950-07-25  PCP: Agapito Games, MD JXB:[id number]  DOA: 04/13/2023  LOS: 1 day  Hospital Day: 2  Brief narrative: Donna Sexton is a 73 y.o. female with PMH significant for morbid obesity, OSA, DM2, HTN, HLD, CHF, A-fib, GERD, chronic anemia, migraine, bipolar disorder, arthritis, asthma  Recently hospitalized 2/19 to 2/28 for sepsis secondary to COVID, encephalopathy, A-fib with RVR.  She was eventually discharged to rehab on amiodarone and Eliquis.  She was in the rehab for 4 weeks.  By the time of her release to home, she was able to ambulate with some help.  However she started to decline again at home and has been in bed for the last 3 days.  Lives with her ex-husband and sometimes caregivers at night.. 4/3, patient was brought to the ED by EMS for lethargy, increased somnolence, poor appetite, progressively worsening weakness and a fall at home.  In the ED, patient was hemodynamically stable. Labs showed WBC 9, hemoglobin 11.6, troponin normal, sodium 133, creatinine 1.07 Urinalysis showed clear yellow urine with large amount of hemoglobin, moderate leukocytes, many bacteria Urine culture was sent CT head unremarkable for acute abnormalities  Admitted to medicine for the workup  Subjective: Patient was seen and examined this morning.  Elderly Caucasian female.  Comfortable in bed.  Slow to respond but able to answer orientation questions.  Not in active pain.  Husband at bedside. Chart reviewed. Blood pressure in 160s this morning Repeat labs this morning unremarkable  Assessment and plan: Acute metabolic encephalopathy Brought in for increased somnolence, lethargy Likely secondary to UTI Continue to monitor response on antibiotics If no improvement, will consider MRI  UTI Urinalysis suggestive of infection No fever.  No leukocytosis IV Rocephin started empirically Pending urine culture report Recent Labs  Lab 04/13/23 1750  04/14/23 0701  WBC 9.0 8.3   Type 2 diabetes mellitus A1c 6.2 on 02/16/2023 PTA meds-6.2 on 02/16/2023 Continue SSI/Accu-Cheks Recent Labs  Lab 04/13/23 1725  GLUCAP 104*   CHF  Hypertension Has chronic bilateral nonpitting pedal edema Continue metoprolol, Cardizem, Lasix  Paroxysmal A-fib Was in RVR in the last hospitalization Continue amiodarone, Cardizem, amiodarone Chronically anticoagulated with Eliquis  HLD Lipitor  Morbid Obesity  Body mass index is 41.59 kg/m. Patient has been advised to make an attempt to improve diet and exercise patterns to aid in weight loss.  OSA ??  Not on CPAP  Migraine, bipolar disorder PTA meds-continue Depakote, hydroxyzine, Lyrica. Continue Depakote.  Reduce Lyrica from 3 times daily to nightly.  Right orbital mass CT head done on last admission on 2/18 showed an incidental 1.9 cm right inferomedial orbital mass. Radiology recommended  further evaluation with nonemergent orbital MRI with and without contrast. Repeat CT head this admission did not comment on it. Will obtain MRI brain with and without contrast   Mobility: Pending PT eval  Goals of care   Code Status: Full Code     DVT prophylaxis:   apixaban (ELIQUIS) tablet 5 mg   Antimicrobials: IV Rocephin Fluid: None Consultants: None Family Communication: Ex-husband at bedside  Status: Patient Level of care:  Telemetry Medical   Patient is from: Home Needs to continue in-hospital care: Continue to workup Anticipated d/c to: Likely SNF 3 to 4 days    Diet:  Diet Order             Diet regular Room service appropriate? Yes; Fluid consistency: Thin  Diet effective now  Scheduled Meds:  amiodarone  200 mg Oral Daily   apixaban  5 mg Oral BID   atorvastatin  40 mg Oral Daily   diltiazem  180 mg Oral Daily   famotidine  20 mg Oral Daily   furosemide  80 mg Oral Daily   metoprolol tartrate  25 mg Oral BID   oxybutynin  5 mg Oral BID    polyethylene glycol  17 g Oral Daily   pregabalin  50 mg Oral QHS   senna-docusate  2 tablet Oral QHS    PRN meds: acetaminophen **OR** acetaminophen, bisacodyl, melatonin, ondansetron **OR** ondansetron (ZOFRAN) IV   Infusions:   cefTRIAXone (ROCEPHIN)  IV 1 g (04/14/23 1129)    Antimicrobials: Anti-infectives (From admission, onward)    Start     Dose/Rate Route Frequency Ordered Stop   04/14/23 1000  cefTRIAXone (ROCEPHIN) 1 g in sodium chloride 0.9 % 100 mL IVPB        1 g 200 mL/hr over 30 Minutes Intravenous Every 24 hours 04/13/23 2201     04/13/23 2130  cefTRIAXone (ROCEPHIN) 2 g in sodium chloride 0.9 % 100 mL IVPB        2 g 200 mL/hr over 30 Minutes Intravenous  Once 04/13/23 2120 04/13/23 2204       Objective: Vitals:   04/14/23 0915 04/14/23 1217  BP: (!) 161/128 (!) 99/57  Pulse: 75 73  Resp:    Temp:  98.4 F (36.9 C)  SpO2:  90%    Intake/Output Summary (Last 24 hours) at 04/14/2023 1347 Last data filed at 04/14/2023 0434 Gross per 24 hour  Intake 732.95 ml  Output --  Net 732.95 ml   Filed Weights   04/14/23 0120  Weight: 109.9 kg   Weight change:  Body mass index is 41.59 kg/m.   Physical Exam: General exam: Pleasant, elderly Caucasian female.  Not in distress Skin: No rashes, lesions or ulcers. HEENT: Atraumatic, normocephalic, no obvious bleeding Lungs: Clear to auscultation bilaterally,  CVS: S1, S2, no murmur,   GI/Abd: Soft, nontender, nondistended, bowel sound present,   CNS: Alert, awake, slow to respond, oriented x 3 Psychiatry: Sad affect Extremities: Chronic bilateral lower extremity nonpitting pedal edema, no calf tenderness,   Data Review: I have personally reviewed the laboratory data and studies available.  F/u labs  Unresulted Labs (From admission, onward)     Start     Ordered   04/13/23 2136  Rapid urine drug screen (hospital performed)  ONCE - STAT,   STAT        04/13/23 2136   04/13/23 2007  Urine Culture  Once,    R        04/13/23 2007            Total time spent in review of labs and imaging, patient evaluation, formulation of plan, documentation and communication with family: 55 minutes  Signed, Lorin Glass, MD [facility] 04/14/2023

## 2023-04-14 NOTE — Assessment & Plan Note (Signed)
 We discussed getting further imaging with nonemergent orbital MRI with and without contrast will go ahead and get that ordered and get her scheduled in the next couple of weeks when she is feeling better and stronger to come back in.

## 2023-04-14 NOTE — Assessment & Plan Note (Signed)
 Says she remembers during the hospitalization having some visual hallucinations she was very sick at that time she was septic and she was on steroids I gave her reassurance she says since being home it has been better but she will still occasionally noticed something in her side vision that she thinks is there and then realizes it is really not.  Again we will continue to monitor that carefully over the next couple of weeks to see if she is continuing to improve.

## 2023-04-14 NOTE — Plan of Care (Signed)

## 2023-04-14 NOTE — Telephone Encounter (Signed)
 Left verbal orders approval for speech therapy evaluation on kelly with [facility] confidential voice mail

## 2023-04-14 NOTE — Assessment & Plan Note (Signed)
 Weight is stable but she looks like she may have a little bit more volume on.  But she says she feels very thirsty and dry.  She is taking her Lasix regularly.

## 2023-04-15 DIAGNOSIS — B999 Unspecified infectious disease: Secondary | ICD-10-CM | POA: Diagnosis not present

## 2023-04-15 DIAGNOSIS — G9349 Other encephalopathy: Secondary | ICD-10-CM | POA: Diagnosis not present

## 2023-04-15 NOTE — Evaluation (Signed)
 Occupational Therapy Evaluation Patient Details Name: Donna Sexton MRN: [id number] DOB: 1950/07/09 Today's Date: 04/15/2023   History of Present Illness   73 y.o. female admitted 4/3 with AMS, lethargy, weakness, encephalopathy and UTI. PMhx: morbid obesity, OSA, DM2, HTN, HLD, CHF, Afib, GERD, chronic anemia, migraine, bipolar disorder, arthritis, asthma     Clinical Impressions PTA pt was mod I for ADLs and living alone since DC from SNF in Feb, reports independently using RW. Pt currently presenting with impaired balance and some cognitive deficits, demonstrates a Fear of Falling with OOB mobility. Pt ambulatory limited distance in room with min A despite having chair follow to ensure her safety, and needing Max A to CGA for ADLs. OT to continue following pt acutely to address listed deficits and help transition to next level of care. Patient will benefit from continued inpatient follow up therapy, <3 hours/day      If plan is discharge home, recommend the following:   A lot of help with bathing/dressing/bathroom;A little help with walking and/or transfers;Assistance with cooking/housework;Assist for transportation;Help with stairs or ramp for entrance     Functional Status Assessment   Patient has had a recent decline in their functional status and demonstrates the ability to make significant improvements in function in a reasonable and predictable amount of time.     Equipment Recommendations   None recommended by OT (defer)     Recommendations for Other Services         Precautions/Restrictions   Precautions Precautions: Fall Recall of Precautions/Restrictions: Impaired Restrictions Weight Bearing Restrictions Per Provider Order: No     Mobility Bed Mobility Overal bed mobility: Needs Assistance Bed Mobility: Supine to Sit, Sit to Supine     Supine to sit: HOB elevated, Used rails, Mod assist, +2 for physical assistance Sit to supine: Mod assist, +2 for  physical assistance   General bed mobility comments: Pt noted to be a bit fearful of getting to EOB, continued to divert efforts of mobility, Mod A +2 for efficiency    Transfers Overall transfer level: Needs assistance Equipment used: Rolling walker (2 wheels) Transfers: Sit to/from Stand Sit to Stand: Min assist           General transfer comment: cues for and placement, pt ambulated 25ft but declined further mobility suspect due to fearfulness      Balance Overall balance assessment: Needs assistance, History of Falls Sitting-balance support: No upper extremity supported Sitting balance-Leahy Scale: Fair Sitting balance - Comments: sitting EOB CGA   Standing balance support: Bilateral upper extremity supported, Reliant on assistive device for balance, During functional activity Standing balance-Leahy Scale: Poor Standing balance comment: reliant on RW                           ADL either performed or assessed with clinical judgement   ADL Overall ADL's : Needs assistance/impaired Eating/Feeding: Independent;Bed level   Grooming: Sitting;Contact guard assist;Oral care   Upper Body Bathing: Sitting;Contact guard assist   Lower Body Bathing: Moderate assistance;Sit to/from stand   Upper Body Dressing : Sitting;Set up   Lower Body Dressing: Maximal assistance;Sit to/from stand   Toilet Transfer: Minimal assistance;Rolling walker (2 wheels);Ambulation;BSC/3in1   Toileting- Architect and Hygiene: Sit to/from stand;Maximal assistance       Functional mobility during ADLs: Minimal assistance;Rolling walker (2 wheels)       Vision         Perception  Praxis         Pertinent Vitals/Pain Pain Assessment Pain Assessment: Faces Faces Pain Scale: Hurts a little bit Pain Location: R knee Pain Descriptors / Indicators: Aching Pain Intervention(s): Monitored during session, Limited activity within patient's tolerance      Extremity/Trunk Assessment Upper Extremity Assessment Upper Extremity Assessment: Generalized weakness   Lower Extremity Assessment Lower Extremity Assessment: Generalized weakness   Cervical / Trunk Assessment Cervical / Trunk Assessment: Kyphotic   Communication Communication Communication: No apparent difficulties   Cognition Arousal: Alert Behavior During Therapy: Flat affect Cognition: Cognition impaired     Awareness: Intellectual awareness intact, Online awareness impaired   Attention impairment (select first level of impairment): Sustained attention Executive functioning impairment (select all impairments): Reasoning OT - Cognition Comments: FOF, A&Ox4, tangenital                 Following commands: Impaired Following commands impaired: Follows one step commands with increased time, Follows one step commands inconsistently     Cueing  General Comments   Cueing Techniques: Verbal cues;Gestural cues;Tactile cues      Exercises     Shoulder Instructions      Home Living Family/patient expects to be discharged to:: Private residence Living Arrangements: Alone Available Help at Discharge: Friend(s);Available PRN/intermittently Type of Home: House Home Access: Level entry     Home Layout: Two level;Able to live on main level with bedroom/bathroom     Bathroom Shower/Tub: Producer, television/film/video: Handicapped height     Home Equipment: Rollator (4 wheels);Shower seat          Prior Functioning/Environment               Mobility Comments: was independent with rollator use until Feb admission, since D/C home from SNF has had 2 falls, needs assist for all mobility and walking very short distances ADLs Comments: assist for bathing and dressing, using adult briefs since DC from SNF was mod I prior to Feb    OT Problem List: Decreased cognition;Impaired balance (sitting and/or standing);Decreased strength;Decreased activity  tolerance   OT Treatment/Interventions: Self-care/ADL training;Balance training;Therapeutic exercise;Therapeutic activities;Patient/family education;DME and/or AE instruction      OT Goals(Current goals can be found in the care plan section)   Acute Rehab OT Goals Patient Stated Goal: To get stronger OT Goal Formulation: With patient Time For Goal Achievement: 04/29/23 Potential to Achieve Goals: Good ADL Goals Pt Will Perform Grooming: standing;with supervision Pt Will Perform Lower Body Bathing: with set-up;sitting/lateral leans Pt Will Perform Lower Body Dressing: sit to/from stand;with contact guard assist Pt Will Transfer to Toilet: with supervision;ambulating;regular height toilet   OT Frequency:  Min 2X/week    Co-evaluation              AM-PAC OT "6 Clicks" Daily Activity     Outcome Measure Help from another person eating meals?: None Help from another person taking care of personal grooming?: A Little Help from another person toileting, which includes using toliet, bedpan, or urinal?: A Lot Help from another person bathing (including washing, rinsing, drying)?: A Lot Help from another person to put on and taking off regular upper body clothing?: A Little Help from another person to put on and taking off regular lower body clothing?: A Lot 6 Click Score: 16   End of Session Equipment Utilized During Treatment: Gait belt;Rolling walker (2 wheels) Nurse Communication: Mobility status (Fear of falling, +1 with RW)  Activity Tolerance: Patient tolerated treatment well Patient left:  in bed;with call bell/phone within reach;with bed alarm set  OT Visit Diagnosis: Unsteadiness on feet (R26.81);Other abnormalities of gait and mobility (R26.89);Muscle weakness (generalized) (M62.81)                Time: 4540-9811 OT Time Calculation (min): 30 min Charges:  OT General Charges $OT Visit: 1 Visit OT Evaluation $OT Eval Moderate Complexity: 1 Mod OT  Treatments $Therapeutic Activity: 8-22 mins  04/15/2023  AB, OTR/L  Acute Rehabilitation Services  Office: [phone]   Tristan Schroeder 04/15/2023, 5:53 PM

## 2023-04-15 NOTE — Evaluation (Signed)
 Physical Therapy Evaluation Patient Details Name: Donna Sexton MRN: [id number] DOB: July 21, 1950 Today's Date: 04/15/2023  History of Present Illness  73 y.o. female admitted 4/3 with AMS, lethargy, weakness, encephalopathy and UTI. PMhx: morbid obesity, OSA, DM2, HTN, HLD, CHF, Afib, GERD, chronic anemia, migraine, bipolar disorder, arthritis, asthma  Clinical Impression  Pt pleasant with flat affect, anxiety and fear of falling. Pt lives alone and has intermittent assist of ex-husband, neighbor and limited assist of caregiver with report of several falls in time at home since D/C from SNF and limited ability to move. Pt with decreased problem solving, awareness and safety only tolerating 1 min on her feet at max. Pt unsafe to return home alone and would benefit from continued inpatient follow up therapy, <3 hours/day. Will follow acutely to maximize mobility, safety, strength and function. Encouraged OOB daily with staff assist.         If plan is discharge home, recommend the following: A lot of help with walking and/or transfers;A lot of help with bathing/dressing/bathroom;Assist for transportation;Assistance with cooking/housework;Supervision due to cognitive status;Direct supervision/assist for medications management   Can travel by private vehicle   No    Equipment Recommendations Wheelchair (measurements PT);Hospital bed;Wheelchair cushion (measurements PT);BSC/3in1 (pt reports she received BSC but too narrow)  Recommendations for Other Services  OT consult    Functional Status Assessment Patient has had a recent decline in their functional status and/or demonstrates limited ability to make significant improvements in function in a reasonable and predictable amount of time     Precautions / Restrictions Precautions Precautions: Fall Recall of Precautions/Restrictions: Impaired      Mobility  Bed Mobility Overal bed mobility: Needs Assistance Bed Mobility: Supine to Sit      Supine to sit: Min assist, HOB elevated, Used rails     General bed mobility comments: HOB 35 degrees, sequential cues to pivot to EOB, use of UB and rail with min assist to fully transition to sitting    Transfers Overall transfer level: Needs assistance   Transfers: Sit to/from Stand, Bed to chair/wheelchair/BSC Sit to Stand: Min assist, Contact guard assist Stand pivot transfers: Min assist         General transfer comment: min assist to power up from bed with pt reporting pain with touch to axilla for assist, CGA with mod cues for hand placement to rise from recliner x 2. min assist with RW to pivot bed to chair    Ambulation/Gait               General Gait Details: pt denied attempting  Stairs            Wheelchair Mobility     Tilt Bed    Modified Rankin (Stroke Patients Only)       Balance Overall balance assessment: Needs assistance, History of Falls Sitting-balance support: No upper extremity supported Sitting balance-Leahy Scale: Fair     Standing balance support: Bilateral upper extremity supported, Reliant on assistive device for balance, During functional activity Standing balance-Leahy Scale: Poor Standing balance comment: reliant on RW with limited tolerance for standing                             Pertinent Vitals/Pain Pain Assessment Pain Assessment: 0-10 Pain Score: 3  Pain Location: skin with touch Pain Descriptors / Indicators: Aching, Sore Pain Intervention(s): Limited activity within patient's tolerance, Monitored during session, Repositioned    Home Living Family/patient expects  to be discharged to:: Private residence Living Arrangements: Alone Available Help at Discharge: Friend(s);Available PRN/intermittently Type of Home: House Home Access: Level entry       Home Layout: Two level;Able to live on main level with bedroom/bathroom Home Equipment: Rollator (4 wheels);Shower seat      Prior Function  Prior Level of Function : Needs assist             Mobility Comments: was independent with rollator use until Feb admission, since D/C home from SNF has had 2 falls, needs assist for all mobility and walking very short distances ADLs Comments: assist for bathing and dressing, using adult briefs since DC from SNF was mod I prior to Feb     Extremity/Trunk Assessment   Upper Extremity Assessment Upper Extremity Assessment: Generalized weakness    Lower Extremity Assessment Lower Extremity Assessment: Generalized weakness    Cervical / Trunk Assessment Cervical / Trunk Assessment: Kyphotic  Communication   Communication Communication: No apparent difficulties    Cognition Arousal: Alert Behavior During Therapy: Flat affect   PT - Cognitive impairments: Orientation, Awareness, Memory, Problem solving                       PT - Cognition Comments: pt with decreased memory and trouble recalling function prior to Feb or recognizing current limitations. Ex-husband present to clarify Following commands: Impaired Following commands impaired: Follows one step commands with increased time, Follows one step commands inconsistently     Cueing Cueing Techniques: Verbal cues, Gestural cues, Tactile cues     General Comments      Exercises     Assessment/Plan    PT Assessment Patient needs continued PT services  PT Problem List Decreased strength;Decreased activity tolerance;Decreased balance;Decreased mobility;Decreased safety awareness;Decreased knowledge of use of DME;Decreased cognition;Decreased coordination;Obesity;Pain       PT Treatment Interventions Gait training;Functional mobility training;Therapeutic activities;Therapeutic exercise;Patient/family education;Cognitive remediation;Neuromuscular re-education;DME instruction;Balance training    PT Goals (Current goals can be found in the Care Plan section)  Acute Rehab PT Goals Patient Stated Goal: care for  myself PT Goal Formulation: With patient/family Time For Goal Achievement: 04/29/23 Potential to Achieve Goals: Fair    Frequency Min 2X/week     Co-evaluation               AM-PAC PT "6 Clicks" Mobility  Outcome Measure Help needed turning from your back to your side while in a flat bed without using bedrails?: A Little Help needed moving from lying on your back to sitting on the side of a flat bed without using bedrails?: A Lot Help needed moving to and from a bed to a chair (including a wheelchair)?: A Little Help needed standing up from a chair using your arms (e.g., wheelchair or bedside chair)?: A Little Help needed to walk in hospital room?: A Lot Help needed climbing 3-5 steps with a railing? : Total 6 Click Score: 14    End of Session Equipment Utilized During Treatment: Gait belt Activity Tolerance: Patient tolerated treatment well Patient left: in chair;with call bell/phone within reach;with chair alarm set;with family/visitor present Nurse Communication: Mobility status PT Visit Diagnosis: Other abnormalities of gait and mobility (R26.89);Difficulty in walking, not elsewhere classified (R26.2);Muscle weakness (generalized) (M62.81);Repeated falls (R29.6)    Time: 8469-6295 PT Time Calculation (min) (ACUTE ONLY): 33 min   Charges:   PT Evaluation $PT Eval Moderate Complexity: 1 Mod PT Treatments $Therapeutic Activity: 8-22 mins PT General Charges $$ ACUTE PT  VISIT: 1 Visit         Merryl Hacker, PT Acute Rehabilitation Services Office: [phone]   Enedina Finner Loralai Eisman 04/15/2023, 10:30 AM

## 2023-04-15 NOTE — TOC Initial Note (Signed)
 Transition of Care [facility]) - Initial/Assessment Note    Patient Details  Name: Donna Sexton MRN: [id number] Date of Birth: 09-08-1950  [facility] CM/SW Contact:    Nicanor Bake Phone Number: [phone] 04/15/2023, 10:31 AM  Clinical Narrative:   [facility] CSW met with patient and ex-husband at bedside. Patient stated that she lives alone. Patient stated that she is currently active with [facility] services. Patient stated that she use a rollator, cane, and walker. Patient stated that she is currently not driving at this time. Patient stated that she has a history of [facility], but does not want to go back there. Patient stated that she is interested in [facility] if she has to return. Patient stated that she has a scale at home.   Patients ex-husband stated that he is concerned about the patient returning back home alone due to recent falls. He stated that patient needs additional support. Patients ex-husband stated that they do not have family they just have each other and rely on neighbors to help but they work.  Both the patient and ex-husband would like resources about ALF.     TOC will continue following.                  Patient Goals and CMS Choice            Expected Discharge Plan and Services                                              Prior Living Arrangements/Services                       Activities of Daily Living   ADL Screening (condition at time of admission) Independently performs ADLs?: No Does the patient have a NEW difficulty with bathing/dressing/toileting/self-feeding that is expected to last >3 days?: No Does the patient have a NEW difficulty with getting in/out of bed, walking, or climbing stairs that is expected to last >3 days?: No Does the patient have a NEW difficulty with communication that is expected to last >3 days?: No Is the patient deaf or have difficulty hearing?: No Does  the patient have difficulty seeing, even when wearing glasses/contacts?: Yes (Not when wearing glasses) Does the patient have difficulty concentrating, remembering, or making decisions?: No  Permission Sought/Granted                  Emotional Assessment              Admission diagnosis:  Acute cystitis with hematuria [N30.01] Encephalopathy due to infection [G93.49, B99.9] Acute metabolic encephalopathy [G93.41] Patient Active Problem List   Diagnosis Date Noted   Concern about memory 04/14/2023   Tremor 04/14/2023   Encephalopathy due to infection 04/13/2023   Encounter for monitoring amiodarone therapy 03/21/2023   Hypercoagulable state due to persistent atrial fibrillation (HCC) 03/21/2023   Orbital mass, right 03/13/2023   V-tach (HCC) 03/01/2023   Acute hypoxemic respiratory failure due to COVID-19 (HCC) 03/01/2023   Atrial fibrillation with rapid ventricular response (HCC) 03/01/2023   Senile purpura (HCC) 02/16/2023   Encounter for weight management 04/25/2022   Palpitations 02/04/2022   DDD (degenerative disc disease), cervical 02/05/2021   Chronic heart failure with preserved ejection fraction (HCC) 05/16/2020   Impaired glucose tolerance 10/10/2019  Morbid obesity (HCC) 10/09/2019   Acute pain of right shoulder 02/23/2018   GAD (generalized anxiety disorder) 12/20/2017   Trigger finger, left little finger 03/15/2017   Primary osteoarthritis of both knees 01/23/2017   Localized, primary osteoarthritis of hand, left 01/23/2017   Lumbar radiculitis 09/07/2015   IBS (irritable bowel syndrome) 08/07/2015   Elevated LFTs 05/18/2015   Hyperglycemia 05/12/2015   Lumbago 11/05/2014   Basal cell papilloma 09/11/2014   Dyspnea 09/04/2014   Restless leg 04/16/2014   Muscle spasm 02/27/2014   Overactive bladder 04/09/2013   Abnormal mammogram 06/22/2012   Bipolar disorder (HCC) 09/29/2011   Hyperlipidemia 09/29/2011   Essential hypertension 09/29/2011    Telogen effluvium 08/16/2011   Calcium blood increased 01/11/2011   Anxiety state 10/04/2010   Clinical depression 10/04/2010   Anal bleeding 10/04/2010   Cannot sleep 10/04/2010   GERD 07/28/2009   COMMON MIGRAINE 12/29/2008   OSTEOPENIA 10/30/2007   PCP:  Agapito Games, MD Pharmacy:   [facility], [place] - [address] [place] [address] Phone: [phone] Fax: [phone]  [facility] [address] [place] [address] Phone: [phone] Fax: [phone]     Social Drivers of Health (SDOH) Social History: SDOH Screenings   Food Insecurity: No Food Insecurity (04/14/2023)  Recent Concern: Food Insecurity - Food Insecurity Present (02/13/2023)  Housing: Low Risk  (04/14/2023)  Transportation Needs: No Transportation Needs (04/14/2023)  Utilities: Not At Risk (04/14/2023)  Alcohol Screen: Low Risk  (03/28/2022)  Depression (PHQ2-9): Low Risk  (08/05/2022)  Financial Resource Strain: Low Risk  (02/13/2023)  Physical Activity: Insufficiently Active (02/13/2023)  Social Connections: Socially Isolated (04/14/2023)  Stress: Stress Concern Present (02/13/2023)  Tobacco Use: Low Risk  (04/14/2023)   SDOH Interventions:     Readmission Risk Interventions     No data to display

## 2023-04-15 NOTE — Progress Notes (Signed)
 PROGRESS NOTE  ABERDEEN HAFEN  DOB: 09-03-1950  PCP: Agapito Games, MD ZOX:[id number]  DOA: 04/13/2023  LOS: 2 days  Hospital Day: 3  Brief narrative: Donna Sexton is a 73 y.o. female with PMH significant for morbid obesity, OSA, DM2, HTN, HLD, CHF, A-fib, GERD, chronic anemia, migraine, bipolar disorder, arthritis, asthma  Recently hospitalized 2/19 to 2/28 for sepsis secondary to COVID, encephalopathy, A-fib with RVR.  She was eventually discharged to rehab on amiodarone and Eliquis.  She was in the rehab for 4 weeks.  By the time of her release to home, she was able to ambulate with some help.  However she started to decline again at home and has been in bed for the last 3 days.  Lives with her ex-husband and sometimes caregivers at night.. 4/3, patient was brought to the ED by EMS for lethargy, increased somnolence, poor appetite, progressively worsening weakness and a fall at home.  In the ED, patient was hemodynamically stable. Labs showed WBC 9, hemoglobin 11.6, troponin normal, sodium 133, creatinine 1.07 Urinalysis showed clear yellow urine with large amount of hemoglobin, moderate leukocytes, many bacteria Urine culture was sent CT head unremarkable for acute abnormalities  Admitted to medicine for the workup  Subjective: Patient was seen and examined this morning.  Lying on bed.  Not in distress.  Feels weak.  [facility] PT this morning.  SNF recommended.    Assessment and plan: Acute metabolic encephalopathy Brought in for increased somnolence, lethargy Likely secondary to UTI Mental status slow but gradually improving   E. coli UTI Urinalysis suggestive of infection No fever.  No leukocytosis Urine culture grew more than 1000 CFU per mL of E. coli. Currently on empiric IV Rocephin. Pending sensitivity Recent Labs  Lab 04/13/23 1750 04/14/23 0701  WBC 9.0 8.3   Type 2 diabetes mellitus A1c 6.2 on 02/16/2023 PTA meds-6.2 on 02/16/2023 Continue  SSI/Accu-Cheks Recent Labs  Lab 04/13/23 1725  GLUCAP 104*   CHF  Hypertension Has chronic bilateral nonpitting pedal edema Continue metoprolol, Cardizem, Lasix  Paroxysmal A-fib Was in RVR in the last hospitalization Continue amiodarone, Cardizem, amiodarone Chronically anticoagulated with Eliquis  HLD Lipitor  Morbid Obesity  Body mass index is 41.59 kg/m. Patient has been advised to make an attempt to improve diet and exercise patterns to aid in weight loss.  OSA ??  Not on CPAP  Migraine, bipolar disorder PTA meds-continue Depakote, hydroxyzine, Lyrica. Continue Depakote.  Continue Lyrica twice daily.  Hydroxyzine on hold.    Right orbital mass CT head done on last admission on 2/18 showed an incidental 1.9 cm right inferomedial orbital mass. Radiology recommended  further evaluation with nonemergent orbital MRI with and without contrast. Repeat CT head this admission did not comment on it. 4/4, MRI orbits with contrast did not show any mass.    Mobility: SNF per PT  Goals of care   Code Status: Full Code     DVT prophylaxis:   apixaban (ELIQUIS) tablet 5 mg   Antimicrobials: IV Rocephin Fluid: None Consultants: None Family Communication: Ex-husband at bedside  Status: Patient Level of care:  Telemetry Medical   Patient is from: Home Needs to continue in-hospital care: Continue to workup, pending urine culture report Anticipated d/c to: Likely SNF 3 to 4 days    Diet:  Diet Order             Diet regular Room service appropriate? Yes; Fluid consistency: Thin  Diet effective now  Scheduled Meds:  amiodarone  200 mg Oral Daily   apixaban  5 mg Oral BID   atorvastatin  40 mg Oral Daily   diltiazem  180 mg Oral Daily   famotidine  20 mg Oral Daily   furosemide  80 mg Oral Daily   metoprolol tartrate  25 mg Oral BID   oxybutynin  5 mg Oral BID   polyethylene glycol  17 g Oral Daily   pregabalin  50 mg Oral QHS    senna-docusate  2 tablet Oral QHS    PRN meds: acetaminophen **OR** acetaminophen, bisacodyl, melatonin, ondansetron **OR** ondansetron (ZOFRAN) IV   Infusions:   cefTRIAXone (ROCEPHIN)  IV 1 g (04/15/23 1053)    Antimicrobials: Anti-infectives (From admission, onward)    Start     Dose/Rate Route Frequency Ordered Stop   04/14/23 1000  cefTRIAXone (ROCEPHIN) 1 g in sodium chloride 0.9 % 100 mL IVPB        1 g 200 mL/hr over 30 Minutes Intravenous Every 24 hours 04/13/23 2201     04/13/23 2130  cefTRIAXone (ROCEPHIN) 2 g in sodium chloride 0.9 % 100 mL IVPB        2 g 200 mL/hr over 30 Minutes Intravenous  Once 04/13/23 2120 04/13/23 2204       Objective: Vitals:   04/15/23 1050 04/15/23 1449  BP: (!) 152/131 105/60  Pulse:  71  Resp:  19  Temp:  99.1 F (37.3 C)  SpO2:  (!) 88%    Intake/Output Summary (Last 24 hours) at 04/15/2023 1519 Last data filed at 04/15/2023 1453 Gross per 24 hour  Intake 340 ml  Output 800 ml  Net -460 ml   Filed Weights   04/14/23 0120  Weight: 109.9 kg   Weight change:  Body mass index is 41.59 kg/m.   Physical Exam: General exam: Pleasant, elderly Caucasian female.  Not in distress Skin: No rashes, lesions or ulcers. HEENT: Atraumatic, normocephalic, no obvious bleeding Lungs: Clear to auscultation bilaterally,  CVS: S1, S2, no murmur,   GI/Abd: Soft, nontender, nondistended, bowel sound present,   CNS: Alert, awake, slow to respond, oriented x 3 Psychiatry: Sad affect Extremities: Chronic bilateral lower extremity nonpitting pedal edema, no calf tenderness,   Data Review: I have personally reviewed the laboratory data and studies available.  F/u labs  Unresulted Labs (From admission, onward)     Start     Ordered   04/13/23 2136  Rapid urine drug screen (hospital performed)  ONCE - STAT,   STAT        04/13/23 2136            Total time spent in review of labs and imaging, patient evaluation, formulation of plan,  documentation and communication with family: 45 minutes  Signed, Lorin Glass, MD [facility] 04/15/2023

## 2023-04-15 NOTE — Plan of Care (Signed)

## 2023-04-16 DIAGNOSIS — B999 Unspecified infectious disease: Secondary | ICD-10-CM | POA: Diagnosis not present

## 2023-04-16 DIAGNOSIS — G9349 Other encephalopathy: Secondary | ICD-10-CM | POA: Diagnosis not present

## 2023-04-16 LAB — URINE CULTURE: Culture: >=100000 — AB

## 2023-04-16 NOTE — Progress Notes (Signed)
 PROGRESS NOTE  Donna Sexton  DOB: 1950-06-08  PCP: Agapito Games, MD ZOX:[id number]  DOA: 04/13/2023  LOS: 3 days  Hospital Day: 4  Brief narrative: Donna Sexton is a 73 y.o. female with PMH significant for morbid obesity, OSA, DM2, HTN, HLD, CHF, A-fib, GERD, chronic anemia, migraine, bipolar disorder, arthritis, asthma  Recently hospitalized 2/19 to 2/28 for sepsis secondary to COVID, encephalopathy, A-fib with RVR.  She was eventually discharged to rehab on amiodarone and Eliquis.  She was in the rehab for 4 weeks.  By the time of her release to home, she was able to ambulate with some help.  However she started to decline again at home and has been in bed for the last 3 days.  Lives with her ex-husband and sometimes caregivers at night.. 4/3, patient was brought to the ED by EMS for lethargy, increased somnolence, poor appetite, progressively worsening weakness and a fall at home.  In the ED, patient was hemodynamically stable. Labs showed WBC 9, hemoglobin 11.6, troponin normal, sodium 133, creatinine 1.07 Urinalysis showed clear yellow urine with large amount of hemoglobin, moderate leukocytes, many bacteria Urine culture was sent CT head unremarkable for acute abnormalities  Admitted to medicine for the workup  Subjective: Patient was seen and examined this morning.  Lying on bed.  Not in distress.  Husband at bedside. Patient is worried about dark urine color.  Assessment and plan: Acute metabolic encephalopathy Brought in for increased somnolence, lethargy Likely secondary to UTI Mental status slow but gradually improving   E. coli UTI Urinalysis suggestive of infection No fever.  No leukocytosis Urine culture grew more than 1000 CFU per mL of E. coli. Currently on empiric IV Rocephin. Pending sensitivity Recent Labs  Lab 04/13/23 1750 04/14/23 0701  WBC 9.0 8.3   Type 2 diabetes mellitus A1c 6.2 on 02/16/2023 PTA meds-6.2 on 02/16/2023 Continue  SSI/Accu-Cheks Recent Labs  Lab 04/13/23 1725  GLUCAP 104*   CHF  Hypertension Has chronic bilateral nonpitting pedal edema Continue metoprolol, Cardizem, Lasix Patient is concerned about dark urine color.  Encourage oral hydration.  Repeat labs tomorrow.  If creatinine is worsening, Lasix.  Paroxysmal A-fib Was in RVR in the last hospitalization Continue amiodarone, Cardizem, amiodarone Chronically anticoagulated with Eliquis  HLD Lipitor  Morbid Obesity  Body mass index is 41.59 kg/m. Patient has been advised to make an attempt to improve diet and exercise patterns to aid in weight loss.  OSA ??  Not on CPAP  Migraine, bipolar disorder PTA meds-continue Depakote, hydroxyzine, Lyrica. Continue Depakote.  Continue Lyrica twice daily.  Hydroxyzine on hold.    Right orbital mass CT head done on last admission on 2/18 showed an incidental 1.9 cm right inferomedial orbital mass. Radiology recommended  further evaluation with nonemergent orbital MRI with and without contrast. Repeat CT head this admission did not comment on it. 4/4, MRI orbits with contrast did not show any mass.    Mobility: SNF per PT  Goals of care   Code Status: Full Code     DVT prophylaxis:   apixaban (ELIQUIS) tablet 5 mg   Antimicrobials: IV Rocephin Fluid: None Consultants: None Family Communication: Ex-husband at bedside  Status: Patient Level of care:  Telemetry Medical   Patient is from: Home Needs to continue in-hospital care: Pending urine culture sensitivity Anticipated d/c to: Likely SNF 1 to 2 days    Diet:  Diet Order  Diet regular Room service appropriate? Yes; Fluid consistency: Thin  Diet effective now                   Scheduled Meds:  amiodarone  200 mg Oral Daily   apixaban  5 mg Oral BID   atorvastatin  40 mg Oral Daily   diltiazem  180 mg Oral Daily   famotidine  20 mg Oral Daily   furosemide  80 mg Oral Daily   metoprolol tartrate  25 mg  Oral BID   oxybutynin  5 mg Oral BID   polyethylene glycol  17 g Oral Daily   pregabalin  50 mg Oral QHS   senna-docusate  2 tablet Oral QHS    PRN meds: acetaminophen **OR** acetaminophen, bisacodyl, melatonin, ondansetron **OR** ondansetron (ZOFRAN) IV   Infusions:   cefTRIAXone (ROCEPHIN)  IV 1 g (04/16/23 1023)    Antimicrobials: Anti-infectives (From admission, onward)    Start     Dose/Rate Route Frequency Ordered Stop   04/14/23 1000  cefTRIAXone (ROCEPHIN) 1 g in sodium chloride 0.9 % 100 mL IVPB        1 g 200 mL/hr over 30 Minutes Intravenous Every 24 hours 04/13/23 2201     04/13/23 2130  cefTRIAXone (ROCEPHIN) 2 g in sodium chloride 0.9 % 100 mL IVPB        2 g 200 mL/hr over 30 Minutes Intravenous  Once 04/13/23 2120 04/13/23 2204       Objective: Vitals:   04/16/23 0854 04/16/23 1302  BP: 131/61 117/67  Pulse: 72 76  Resp: 16 16  Temp: 98.8 F (37.1 C) 99 F (37.2 C)  SpO2: 93% (!) 86%    Intake/Output Summary (Last 24 hours) at 04/16/2023 1437 Last data filed at 04/16/2023 1300 Gross per 24 hour  Intake 338 ml  Output 1610 ml  Net -1272 ml   Filed Weights   04/14/23 0120  Weight: 109.9 kg   Weight change:  Body mass index is 41.59 kg/m.   Physical Exam: General exam: Pleasant, elderly Caucasian female.  Not in distress Skin: No rashes, lesions or ulcers. HEENT: Atraumatic, normocephalic, no obvious bleeding Lungs: Clear to auscultation bilaterally,  CVS: S1, S2, no murmur,   GI/Abd: Soft, nontender, nondistended, bowel sound present,   CNS: Alert, awake, slow to respond, oriented x 3 Psychiatry: Sad affect Extremities: Chronic bilateral lower extremity nonpitting pedal edema, no calf tenderness,   Data Review: I have personally reviewed the laboratory data and studies available.  F/u labs  Unresulted Labs (From admission, onward)     Start     Ordered   04/13/23 2136  Rapid urine drug screen (hospital performed)  ONCE - STAT,   STAT         04/13/23 2136            Total time spent in review of labs and imaging, patient evaluation, formulation of plan, documentation and communication with family: 45 minutes  Signed, Lorin Glass, MD [facility] 04/16/2023

## 2023-04-16 NOTE — Plan of Care (Signed)

## 2023-04-17 DIAGNOSIS — B999 Unspecified infectious disease: Secondary | ICD-10-CM | POA: Diagnosis not present

## 2023-04-17 DIAGNOSIS — G9349 Other encephalopathy: Secondary | ICD-10-CM | POA: Diagnosis not present

## 2023-04-17 LAB — BASIC METABOLIC PANEL WITH GFR
Anion gap: 12 (ref 5–15)
BUN: 6 mg/dL — ABNORMAL LOW (ref 8–23)
CO2: 28 mmol/L (ref 22–32)
Calcium: 9.2 mg/dL (ref 8.9–10.3)
Chloride: 94 mmol/L — ABNORMAL LOW (ref 98–111)
Creatinine, Ser: 0.9 mg/dL (ref 0.44–1.00)
GFR, Estimated: >60 mL/min (ref >60)
Glucose, Bld: 141 mg/dL — ABNORMAL HIGH (ref 70–99)
Potassium: 4.4 mmol/L (ref 3.5–5.1)
Sodium: 134 mmol/L — ABNORMAL LOW (ref 135–145)

## 2023-04-17 LAB — CBC
HCT: 39.6 % (ref 36.0–46.0)
Hemoglobin: 13.9 g/dL (ref 12.0–15.0)
MCH: 33.9 pg (ref 26.0–34.0)
MCHC: 35.1 g/dL (ref 30.0–36.0)
MCV: 96.6 fL (ref 80.0–100.0)
Platelets: 232 10*3/uL (ref 150–400)
RBC: 4.1 MIL/uL (ref 3.87–5.11)
RDW: 12.9 % (ref 11.5–15.5)
WBC: 7 10*3/uL (ref 4.0–10.5)
nRBC: 0 % (ref 0.0–0.2)

## 2023-04-17 MED ORDER — POLYETHYLENE GLYCOL 3350 17 G PO PACK: 17.0000 g | PACK | Freq: Every day | ORAL | Status: DC | PRN

## 2023-04-17 MED ORDER — MAGNESIUM HYDROXIDE 400 MG/5ML PO SUSP: 30.0000 mL | Freq: Once | ORAL | Status: DC

## 2023-04-17 MED ORDER — DIPHENHYDRAMINE HCL 25 MG PO CAPS
25.0000 mg | ORAL_CAPSULE | Freq: Once | ORAL | Status: AC | PRN
  Administered 2023-04-17: 25 mg via ORAL
  Filled 2023-04-17: qty 1

## 2023-04-17 MED ORDER — DIVALPROEX SODIUM ER 500 MG PO TB24
1500.0000 mg | ORAL_TABLET | Freq: Every day | ORAL | Status: DC
Start: 2023-04-17 — End: 2023-04-20
  Administered 2023-04-17 – 2023-04-19 (×3): 1500 mg via ORAL
  Filled 2023-04-17 (×4): qty 3

## 2023-04-17 NOTE — Progress Notes (Signed)
 Name: Donna Sexton DOB: [birth year]  Please be advised that the above-named patient will require a short-term nursing home stay -- anticipated 30 days or less for rehabilitation and strengthening. The plan is for return home.

## 2023-04-17 NOTE — NC FL2 (Signed)
 [place] MEDICAID FL2 LEVEL OF CARE FORM     IDENTIFICATION  Patient Name: Donna Sexton Birthdate: 07/11/1950 Sex: female Admission Date (Current Location): 04/13/2023  [facility] and [place] Number:  Producer, television/film/video and Address:  The . [facility], [address], [place], [place] [address]      Provider Number: [id number]  Attending Physician Name and Address:  Lorin Glass, MD  Relative Name and Phone Number:  Eliane Decree)  [phone]    Current Level of Care: Hospital Recommended Level of Care: Skilled Nursing Facility Prior Approval Number:    Date Approved/Denied:  ([id number] E Expired 04/07/2023) PASRR Number: screen still running  Discharge Plan: SNF    Current Diagnoses: Patient Active Problem List   Diagnosis Date Noted   Concern about memory 04/14/2023   Tremor 04/14/2023   Encephalopathy due to infection 04/13/2023   Encounter for monitoring amiodarone therapy 03/21/2023   Hypercoagulable state due to persistent atrial fibrillation (HCC) 03/21/2023   Orbital mass, right 03/13/2023   V-tach (HCC) 03/01/2023   Acute hypoxemic respiratory failure due to COVID-19 [facility]) 03/01/2023   Atrial fibrillation with rapid ventricular response (HCC) 03/01/2023   Senile purpura (HCC) 02/16/2023   Encounter for weight management 04/25/2022   Palpitations 02/04/2022   DDD (degenerative disc disease), cervical 02/05/2021   Chronic heart failure with preserved ejection fraction (HCC) 05/16/2020   Impaired glucose tolerance 10/10/2019   Morbid obesity (HCC) 10/09/2019   Acute pain of right shoulder 02/23/2018   GAD (generalized anxiety disorder) 12/20/2017   Trigger finger, left little finger 03/15/2017   Primary osteoarthritis of both knees 01/23/2017   Localized, primary osteoarthritis of hand, left 01/23/2017   Lumbar radiculitis 09/07/2015   IBS (irritable bowel syndrome) 08/07/2015   Elevated LFTs 05/18/2015   Hyperglycemia  05/12/2015   Lumbago 11/05/2014   Basal cell papilloma 09/11/2014   Dyspnea 09/04/2014   Restless leg 04/16/2014   Muscle spasm 02/27/2014   Overactive bladder 04/09/2013   Abnormal mammogram 06/22/2012   Bipolar disorder (HCC) 09/29/2011   Hyperlipidemia 09/29/2011   Essential hypertension 09/29/2011   Telogen effluvium 08/16/2011   Calcium blood increased 01/11/2011   Anxiety state 10/04/2010   Clinical depression 10/04/2010   Anal bleeding 10/04/2010   Cannot sleep 10/04/2010   GERD 07/28/2009   COMMON MIGRAINE 12/29/2008   OSTEOPENIA 10/30/2007    Orientation RESPIRATION BLADDER Height & Weight     Self  Normal Continent Weight: 242 lb 4.6 oz (109.9 kg) Height:  5\' 4"  (162.6 cm)  BEHAVIORAL SYMPTOMS/MOOD NEUROLOGICAL BOWEL NUTRITION STATUS      Continent Diet (see DC summary)  AMBULATORY STATUS COMMUNICATION OF NEEDS Skin   Extensive Assist Verbally Other (Comment) (03/03/23 Skin tear Arm Anterior;Left;Lower Opened blister. 03/03/23 Skin tear Chest Lateral;Left;Upper opened blister)                       Personal Care Assistance Level of Assistance  Bathing, Feeding, Dressing Bathing Assistance: Maximum assistance Feeding assistance: Limited assistance Dressing Assistance: Maximum assistance     Functional Limitations Info  Sight, Hearing, Speech Sight Info: Impaired (wears glasses) Hearing Info: Adequate Speech Info: Adequate    SPECIAL CARE FACTORS FREQUENCY  PT (By licensed PT), OT (By licensed OT)     PT Frequency: 5x/week OT Frequency: 5x/week            Contractures Contractures Info: Not present    Additional Factors Info  Code Status, Allergies Code Status Info:  FULL Allergies Info: Other  Morphine           Current Medications (04/17/2023):  This is the current hospital active medication list Current Facility-Administered Medications  Medication Dose Route Frequency Provider Last Rate Last Admin   acetaminophen (TYLENOL) tablet  500 mg  500 mg Oral Q6H PRN Buena Irish, MD   500 mg at 04/16/23 2113   Or   acetaminophen (TYLENOL) suppository 650 mg  650 mg Rectal Q6H PRN Buena Irish, MD       amiodarone (PACERONE) tablet 200 mg  200 mg Oral Daily Buena Irish, MD   200 mg at 04/17/23 1024   apixaban (ELIQUIS) tablet 5 mg  5 mg Oral BID Dahal, Melina Schools, MD   5 mg at 04/17/23 1025   atorvastatin (LIPITOR) tablet 40 mg  40 mg Oral Daily Buena Irish, MD   40 mg at 04/17/23 1024   diltiazem (CARDIZEM CD) 24 hr capsule 180 mg  180 mg Oral Daily Buena Irish, MD   180 mg at 04/17/23 1024   famotidine (PEPCID) tablet 20 mg  20 mg Oral Daily Buena Irish, MD   20 mg at 04/17/23 1025   furosemide (LASIX) tablet 80 mg  80 mg Oral Daily Buena Irish, MD   80 mg at 04/17/23 1025   melatonin tablet 3 mg  3 mg Oral QHS PRN Buena Irish, MD   3 mg at 04/16/23 2110   metoprolol tartrate (LOPRESSOR) tablet 25 mg  25 mg Oral BID Dahal, Melina Schools, MD   25 mg at 04/17/23 1024   ondansetron (ZOFRAN) tablet 4 mg  4 mg Oral Q6H PRN Buena Irish, MD       Or   ondansetron [facility]) injection 4 mg  4 mg Intravenous Q6H PRN Buena Irish, MD   4 mg at 04/15/23 0931   oxybutynin (DITROPAN) tablet 5 mg  5 mg Oral BID Buena Irish, MD   5 mg at 04/17/23 1024   polyethylene glycol (MIRALAX / GLYCOLAX) packet 17 g  17 g Oral Daily PRN Dahal, Melina Schools, MD       pregabalin (LYRICA) capsule 50 mg  50 mg Oral QHS Dahal, Melina Schools, MD   50 mg at 04/16/23 2110   senna-docusate (Senokot-S) tablet 2 tablet  2 tablet Oral QHS Buena Irish, MD   2 tablet at 04/16/23 2110     Discharge Medications: Please see discharge summary for a list of discharge medications.  Relevant Imaging Results:  Relevant Lab Results:   Additional Information SSN: [id number]  Pansey Pinheiro A [place], LCSW

## 2023-04-17 NOTE — TOC Progression Note (Addendum)
 Transition of Care [facility]) - Progression Note    Patient Details  Name: Donna Sexton MRN: [id number] Date of Birth: 12-08-1950  [facility] CM/SW Contact  Darleene Cumpian A [place], LCSW Phone Number: 04/17/2023, 3:00 PM  Clinical Narrative:     CSW met with pt and pt's family son Donna Sexton at bedside. CSW mainly spoke with Josh as pt was eating and somewhat tired. He stated that pt had been at [facility], about 30 days, would prefer not to go there if they were able to do rehab again.   He informed CSW that pt's family is not currently able to manage pt at home, she was receiving home health PT, waiting on home health aid, and was not enough. Agreeable to recommendation for SNF. SNF workup completed, bed offers pending.   CSW faxed pt out for referral. Informed pt and Josh that pt may be in copay days and discussed that possibility.         Expected Discharge Plan and Services                                               Social Determinants of Health (SDOH) Interventions SDOH Screenings   Food Insecurity: No Food Insecurity (04/14/2023)  Recent Concern: Food Insecurity - Food Insecurity Present (02/13/2023)  Housing: Low Risk  (04/14/2023)  Transportation Needs: No Transportation Needs (04/14/2023)  Utilities: Not At Risk (04/14/2023)  Alcohol Screen: Low Risk  (03/28/2022)  Depression (PHQ2-9): Low Risk  (08/05/2022)  Financial Resource Strain: Low Risk  (02/13/2023)  Physical Activity: Insufficiently Active (02/13/2023)  Social Connections: Socially Isolated (04/14/2023)  Stress: Stress Concern Present (02/13/2023)  Tobacco Use: Low Risk  (04/14/2023)    Readmission Risk Interventions     No data to display

## 2023-04-17 NOTE — Care Management Important Message (Signed)
 Important Message  Patient Details  Name: Donna Sexton MRN: [id number] Date of Birth: Jan 16, 1950   Important Message Given:  Yes - Medicare IM     Dorena Bodo 04/17/2023, 3:08 PM

## 2023-04-17 NOTE — Progress Notes (Signed)
 PROGRESS NOTE  Donna Sexton  DOB: 1950-07-31  PCP: Agapito Games, MD ZOX:[id number]  DOA: 04/13/2023  LOS: 4 days  Hospital Day: 5  Brief narrative: Donna Sexton is a 73 y.o. female with PMH significant for morbid obesity, OSA, DM2, HTN, HLD, CHF, A-fib, GERD, chronic anemia, migraine, bipolar disorder, arthritis, asthma  Recently hospitalized 2/19 to 2/28 for sepsis secondary to COVID, encephalopathy, A-fib with RVR.  She was eventually discharged to rehab on amiodarone and Eliquis.  She was in the rehab for 4 weeks.  By the time of her release to home, she was able to ambulate with some help.  However she started to decline again at home and has been in bed for the last 3 days.  Lives with her ex-husband and sometimes caregivers at night.. 4/3, patient was brought to the ED by EMS for lethargy, increased somnolence, poor appetite, progressively worsening weakness and a fall at home.  In the ED, patient was hemodynamically stable. Labs showed WBC 9, hemoglobin 11.6, troponin normal, sodium 133, creatinine 1.07 Urinalysis showed clear yellow urine with large amount of hemoglobin, moderate leukocytes, many bacteria Urine culture was sent CT head unremarkable for acute abnormalities  Admitted to medicine for the workup  Subjective: Patient was seen and examined this morning.  Lying on bed.  Not in distress.  No fever. Husband at bedside Patient is concerned about dark urine color.  On Lasix.  Pending labs today. Both are very concerned about her prospect of not being approved for an SNF  Assessment and plan: Acute metabolic encephalopathy Brought in for increased somnolence, lethargy Likely secondary to UTI Mental status improved back to baseline  E. coli UTI Urinalysis suggestive of infection No fever.  No leukocytosis Urine culture grew more than 1000 CFU per mL of E. coli, pansensitive To complete 5-day course of IV Rocephin today Recent Labs  Lab 04/13/23 1750  04/14/23 0701  WBC 9.0 8.3   Type 2 diabetes mellitus A1c 6.2 on 02/16/2023 PTA meds-6.2 on 02/16/2023 Continue SSI/Accu-Cheks Recent Labs  Lab 04/13/23 1725  GLUCAP 104*   CHF  Hypertension Has chronic bilateral nonpitting pedal edema Continue metoprolol, Cardizem, Lasix Patient is concerned about dark urine color.  Encourage oral hydration.  Repeat labs today..  If creatinine is worsening, we can stop Lasix and give some IV hydration.  Paroxysmal A-fib Was in RVR in the last hospitalization Continue amiodarone, Cardizem, amiodarone Chronically anticoagulated with Eliquis  HLD Lipitor  Morbid Obesity  Body mass index is 41.59 kg/m. Patient has been advised to make an attempt to improve diet and exercise patterns to aid in weight loss.  OSA ??  Not on CPAP  Migraine, bipolar disorder PTA meds-continue Depakote, hydroxyzine, Lyrica. Continue Depakote.  Continue Lyrica twice daily.  Hydroxyzine on hold.   Continue melatonin as needed at night.  Received last night.  Right orbital mass CT head done on last admission on 2/18 showed an incidental 1.9 cm right inferomedial orbital mass. Radiology recommended  further evaluation with nonemergent orbital MRI with and without contrast. Repeat CT head this admission did not comment on it. 4/4, MRI orbits with contrast did not show any mass.    Mobility: SNF per PT  Goals of care   Code Status: Full Code     DVT prophylaxis:   apixaban (ELIQUIS) tablet 5 mg   Antimicrobials: IV Rocephin Fluid: None Consultants: None Family Communication: Ex-husband at bedside  Status: Patient Level of care:  Telemetry Medical  Patient is from: Home Needs to continue in-hospital care: Pending labs today. Anticipated d/c to: Likely SNF 1 to 2 days    Diet:  Diet Order             Diet regular Room service appropriate? Yes; Fluid consistency: Thin  Diet effective now                   Scheduled Meds:  amiodarone  200  mg Oral Daily   apixaban  5 mg Oral BID   atorvastatin  40 mg Oral Daily   diltiazem  180 mg Oral Daily   famotidine  20 mg Oral Daily   furosemide  80 mg Oral Daily   metoprolol tartrate  25 mg Oral BID   oxybutynin  5 mg Oral BID   pregabalin  50 mg Oral QHS   senna-docusate  2 tablet Oral QHS    PRN meds: acetaminophen **OR** acetaminophen, melatonin, ondansetron **OR** ondansetron (ZOFRAN) IV, polyethylene glycol   Infusions:     Antimicrobials: Anti-infectives (From admission, onward)    Start     Dose/Rate Route Frequency Ordered Stop   04/14/23 1000  cefTRIAXone (ROCEPHIN) 1 g in sodium chloride 0.9 % 100 mL IVPB        1 g 200 mL/hr over 30 Minutes Intravenous Every 24 hours 04/13/23 2201 04/17/23 1053   04/13/23 2130  cefTRIAXone (ROCEPHIN) 2 g in sodium chloride 0.9 % 100 mL IVPB        2 g 200 mL/hr over 30 Minutes Intravenous  Once 04/13/23 2120 04/13/23 2204       Objective: Vitals:   04/17/23 0421 04/17/23 0819  BP: 112/69 (!) 146/62  Pulse: (!) 57 70  Resp: 18 16  Temp: 98.1 F (36.7 C) 98.6 F (37 C)  SpO2: (!) 89% 92%    Intake/Output Summary (Last 24 hours) at 04/17/2023 1132 Last data filed at 04/17/2023 0042 Gross per 24 hour  Intake 118 ml  Output 1400 ml  Net -1282 ml   Filed Weights   04/14/23 0120  Weight: 109.9 kg   Weight change:  Body mass index is 41.59 kg/m.   Physical Exam: General exam: Pleasant, elderly Caucasian female.  Not in distress.  Morbidly obese Skin: No rashes, lesions or ulcers. HEENT: Atraumatic, normocephalic, no obvious bleeding Lungs: Clear to auscultation bilaterally,  CVS: S1, S2, no murmur,   GI/Abd: Soft, nontender, nondistended, bowel sound present,   CNS: Alert, awake, slow to respond, oriented x 3 Psychiatry: Sad affect Extremities: Chronic bilateral lower extremity nonpitting pedal edema, no calf tenderness,   Data Review: I have personally reviewed the laboratory data and studies  available.  F/u labs  Unresulted Labs (From admission, onward)     Start     Ordered   04/17/23 1126  Basic metabolic panel with GFR  ONCE - STAT,   STAT       Question:  Specimen collection method  Answer:  Lab=Lab collect   04/17/23 1125   04/17/23 1126  CBC  ONCE - STAT,   STAT       Question:  Specimen collection method  Answer:  Lab=Lab collect   04/17/23 1125   04/13/23 2136  Rapid urine drug screen (hospital performed)  ONCE - STAT,   STAT        04/13/23 2136            Total time spent in review of labs and imaging, patient evaluation, formulation of  plan, documentation and communication with family: 45 minutes  Signed, Lorin Glass, MD [facility] 04/17/2023

## 2023-04-18 DIAGNOSIS — G9349 Other encephalopathy: Secondary | ICD-10-CM | POA: Diagnosis not present

## 2023-04-18 DIAGNOSIS — B999 Unspecified infectious disease: Secondary | ICD-10-CM | POA: Diagnosis not present

## 2023-04-18 MED ORDER — ALUM & MAG HYDROXIDE-SIMETH 200-200-20 MG/5ML PO SUSP
15.0000 mL | Freq: Three times a day (TID) | ORAL | Status: AC
  Administered 2023-04-18 – 2023-04-19 (×3): 15 mL via ORAL
  Filled 2023-04-18 (×3): qty 30

## 2023-04-18 MED ORDER — DIPHENHYDRAMINE HCL 25 MG PO CAPS
25.0000 mg | ORAL_CAPSULE | Freq: Once | ORAL | Status: AC | PRN
  Administered 2023-04-18: 25 mg via ORAL
  Filled 2023-04-18: qty 1
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
## 2023-04-18 NOTE — Progress Notes (Signed)
 Physical Therapy Treatment Patient Details Name: Donna Sexton MRN: [id number] DOB: 1950/11/14 Today's Date: 04/18/2023   History of Present Illness 73 y.o. female admitted 4/3 with AMS, lethargy, weakness, encephalopathy and UTI. PMhx: morbid obesity, OSA, DM2, HTN, HLD, CHF, Afib, GERD, chronic anemia, migraine, bipolar disorder, arthritis, asthma    PT Comments  Pt pleasant, confused with decreased memory and problem solving. Pt able to progress to short gait trials today with close chair follow. Educated for transfers, HEP, and mobility progression. Pt encouraged to get up to chair and [facility] with staff.      If plan is discharge home, recommend the following: A lot of help with bathing/dressing/bathroom;Assist for transportation;Assistance with cooking/housework;Supervision due to cognitive status;Direct supervision/assist for medications management;A little help with walking and/or transfers   Can travel by private vehicle     Yes  Equipment Recommendations  Wheelchair (measurements PT);Hospital bed;Wheelchair cushion (measurements PT);BSC/3in1    Recommendations for Other Services       Precautions / Restrictions Precautions Precautions: Fall Recall of Precautions/Restrictions: Impaired     Mobility  Bed Mobility Overal bed mobility: Modified Independent             General bed mobility comments: HOB 10 degrees, use of rail and pt able to pivot to EOB without physical assist    Transfers Overall transfer level: Needs assistance   Transfers: Sit to/from Stand, Bed to chair/wheelchair/BSC Sit to Stand: Contact guard assist Stand pivot transfers: Contact guard assist         General transfer comment: cues for hand placement with pt standing from bed, recliner and BSC. Total standing trials 7. stand pivot bed to [facility] and back with RW    Ambulation/Gait Ambulation/Gait assistance: Contact guard assist Gait Distance (Feet): 10 Feet Assistive device: Rolling  walker (2 wheels) Gait Pattern/deviations: Step-through pattern, Decreased stride length, Trunk flexed   Gait velocity interpretation: <1.31 ft/sec, indicative of household ambulator   General Gait Details: cues for safety and sequence with close chair follow. Pt walked 8', 10', 10', 10' respectively with close chair follow for each limited by fatigue   Stairs             Wheelchair Mobility     Tilt Bed    Modified Rankin (Stroke Patients Only)       Balance Overall balance assessment: Needs assistance, History of Falls Sitting-balance support: No upper extremity supported Sitting balance-Leahy Scale: Fair Sitting balance - Comments: sitting EOB CGA   Standing balance support: Bilateral upper extremity supported, Reliant on assistive device for balance, During functional activity Standing balance-Leahy Scale: Poor Standing balance comment: reliant on RW                            Communication Communication Communication: No apparent difficulties  Cognition Arousal: Alert Behavior During Therapy: Flat affect   PT - Cognitive impairments: Orientation, Awareness, Memory, Problem solving   Orientation impairments: Time                   PT - Cognition Comments: pt with decreased STM unable to figure out how to use the call buttong to call for NT but could state to use it to call the nurse. Pt requires min-mod cues for sequence and safety, not oriented to day Following commands: Impaired Following commands impaired: Follows one step commands with increased time    Cueing Cueing Techniques: Verbal cues, Tactile cues  Exercises General Exercises -  Lower Extremity Long Arc Quad: AROM, Both, 20 reps, Seated    General Comments        Pertinent Vitals/Pain Pain Assessment Pain Assessment: No/denies pain    Home Living                          Prior Function            PT Goals (current goals can now be found in the care plan  section) Progress towards PT goals: Progressing toward goals    Frequency    Min 2X/week      PT Plan      Co-evaluation              AM-PAC PT "6 Clicks" Mobility   Outcome Measure  Help needed turning from your back to your side while in a flat bed without using bedrails?: None Help needed moving from lying on your back to sitting on the side of a flat bed without using bedrails?: A Little Help needed moving to and from a bed to a chair (including a wheelchair)?: A Little Help needed standing up from a chair using your arms (e.g., wheelchair or bedside chair)?: A Little Help needed to walk in hospital room?: A Little Help needed climbing 3-5 steps with a railing? : Total 6 Click Score: 17    End of Session Equipment Utilized During Treatment: Gait belt Activity Tolerance: Patient tolerated treatment well Patient left: in chair;with call bell/phone within reach;with chair alarm set Nurse Communication: Mobility status PT Visit Diagnosis: Other abnormalities of gait and mobility (R26.89);Difficulty in walking, not elsewhere classified (R26.2);Muscle weakness (generalized) (M62.81);Repeated falls (R29.6)     Time: 1610-9604 PT Time Calculation (min) (ACUTE ONLY): 25 min  Charges:    $Gait Training: 8-22 mins $Therapeutic Activity: 8-22 mins PT General Charges $$ ACUTE PT VISIT: 1 Visit                     Merryl Hacker, PT Acute Rehabilitation Services Office: [phone]    Enedina Finner Percilla Tweten 04/18/2023, 12:04 PM

## 2023-04-18 NOTE — Progress Notes (Signed)
 Mobility Specialist: Progress Note   04/18/23 1612  Mobility  Activity Transferred from chair to bed  Level of Assistance Contact guard assist, steadying assist  Assistive Device Front wheel walker  Activity Response Tolerated well  Mobility Referral Yes  Mobility visit 1 Mobility  Mobility Specialist Start Time (ACUTE ONLY) 1340  Mobility Specialist Stop Time (ACUTE ONLY) 1348  Mobility Specialist Time Calculation (min) (ACUTE ONLY) 8 min    Pt requesting to move back to bed - received in chair. No complaints. CG throughout. Left in bed with all needs met, call bell in reach.   Maurene Capes Mobility Specialist Please contact via SecureChat or Rehab office at [phone]

## 2023-04-18 NOTE — TOC Progression Note (Signed)
 Transition of Care [facility]) - Progression Note    Patient Details  Name: Donna Sexton MRN: [id number] Date of Birth: 07/14/1950  [facility] CM/SW Contact  Fatema Rabe A [place], LCSW Phone Number: 04/18/2023, 12:12 PM  Clinical Narrative:     CSW met with pt at bedside and called pt's son, [name] in room to provide bed offers with Medicare.gov ratings.  CSW informed [name] that pt is in her copay days and facility is estimating payment and would left CSW know exact amount before admission. He said he would discuss with pt's ex-husband and get back to CSW regarding decision. CSW to discuss exact payment details, etc when known and continue dispo plan for SNF.   TOC will continue to follow.         Expected Discharge Plan and Services                                               Social Determinants of Health (SDOH) Interventions SDOH Screenings   Food Insecurity: No Food Insecurity (04/14/2023)  Recent Concern: Food Insecurity - Food Insecurity Present (02/13/2023)  Housing: Low Risk  (04/14/2023)  Transportation Needs: No Transportation Needs (04/14/2023)  Utilities: Not At Risk (04/14/2023)  Alcohol Screen: Low Risk  (03/28/2022)  Depression (PHQ2-9): Low Risk  (08/05/2022)  Financial Resource Strain: Low Risk  (02/13/2023)  Physical Activity: Insufficiently Active (02/13/2023)  Social Connections: Socially Isolated (04/14/2023)  Stress: Stress Concern Present (02/13/2023)  Tobacco Use: Low Risk  (04/14/2023)    Readmission Risk Interventions     No data to display

## 2023-04-18 NOTE — Progress Notes (Signed)
 PROGRESS NOTE  Donna Sexton  DOB: December 07, 1950  PCP: Agapito Games, MD YSA:[id number]  DOA: 04/13/2023  LOS: 5 days  Hospital Day: 6  Brief narrative: Donna Sexton is a 73 y.o. female with PMH significant for morbid obesity, OSA, DM2, HTN, HLD, CHF, A-fib, GERD, chronic anemia, migraine, bipolar disorder, arthritis, asthma  Recently hospitalized 2/19 to 2/28 for sepsis secondary to COVID, encephalopathy, A-fib with RVR.  She was eventually discharged to rehab on amiodarone and Eliquis.  She was in the rehab for 4 weeks.  By the time of her release to home, she was able to ambulate with some help.  However she started to decline again at home and has been in bed for the last 3 days.  Lives with her ex-husband and sometimes caregivers at night.. 4/3, patient was brought to the ED by EMS for lethargy, increased somnolence, poor appetite, progressively worsening weakness and a fall at home.  In the ED, patient was hemodynamically stable. Labs showed WBC 9, hemoglobin 11.6, troponin normal, sodium 133, creatinine 1.07 Urinalysis showed clear yellow urine with large amount of hemoglobin, moderate leukocytes, many bacteria Urine culture was sent CT head unremarkable for acute abnormalities  Admitted to medicine for the workup  Subjective: Patient was seen and examined this morning.  Lying on bed.  Not in distress.  No fever. Had a bowel movement yesterday. Labs yesterday with normal BUN/creatinine. Pending SNF.  Assessment and plan: Acute metabolic encephalopathy Brought in for increased somnolence, lethargy Likely secondary to UTI Mental status improved back to baseline  E. coli UTI Urinalysis suggestive of infection No fever.  No leukocytosis Urine culture grew more than 1000 CFU per mL of E. coli, pansensitive Completed 5-day course of IV Rocephin on 4/7. Recent Labs  Lab 04/13/23 1750 04/14/23 0701 04/17/23 1301  WBC 9.0 8.3 7.0   Type 2 diabetes mellitus A1c  6.2 on 02/16/2023 PTA meds-6.2 on 02/16/2023 Continue SSI/Accu-Cheks Recent Labs  Lab 04/13/23 1725  GLUCAP 104*   CHF  Hypertension Has chronic bilateral nonpitting pedal edema Continue metoprolol, Cardizem, Lasix Last BMP/7 showed BUN/creatinine 6/0.9  Paroxysmal A-fib Was in RVR in the last hospitalization Continue amiodarone, Cardizem, amiodarone Chronically anticoagulated with Eliquis  HLD Lipitor  Morbid Obesity  Body mass index is 41.59 kg/m. Patient has been advised to make an attempt to improve diet and exercise patterns to aid in weight loss.  OSA ??  Not on CPAP  Migraine, bipolar disorder PTA meds-continue Depakote, hydroxyzine, Lyrica. Continue Depakote.  Continue Lyrica twice daily.  Hydroxyzine on hold.   Continue melatonin as needed at night.  Right orbital mass CT head done on last admission on 2/18 showed an incidental 1.9 cm right inferomedial orbital mass. Radiology recommended  further evaluation with nonemergent orbital MRI with and without contrast. Repeat CT head this admission did not comment on it. 4/4, MRI orbits with contrast did not show any mass.  So I believe, the CT scan finding on 2/18 was probably an artifact.   Mobility: SNF per PT  Goals of care   Code Status: Full Code     DVT prophylaxis:   apixaban (ELIQUIS) tablet 5 mg   Antimicrobials: IV Rocephin Fluid: None Consultants: None Family Communication: Ex-husband not at bedside today  Status: Patient Level of care:  Telemetry Medical   Patient is from: Home Needs to continue in-hospital care: Pending SNF.  Medically stable   Diet:  Diet Order  Diet regular Room service appropriate? Yes; Fluid consistency: Thin  Diet effective now                   Scheduled Meds:  alum & mag hydroxide-simeth  15 mL Oral TID WC   amiodarone  200 mg Oral Daily   apixaban  5 mg Oral BID   atorvastatin  40 mg Oral Daily   diltiazem  180 mg Oral Daily   divalproex   1,500 mg Oral QHS   famotidine  20 mg Oral Daily   furosemide  80 mg Oral Daily   metoprolol tartrate  25 mg Oral BID   oxybutynin  5 mg Oral BID   pregabalin  50 mg Oral QHS   senna-docusate  2 tablet Oral QHS    PRN meds: acetaminophen **OR** acetaminophen, melatonin, ondansetron **OR** ondansetron (ZOFRAN) IV, polyethylene glycol   Infusions:     Antimicrobials: Anti-infectives (From admission, onward)    Start     Dose/Rate Route Frequency Ordered Stop   04/14/23 1000  cefTRIAXone (ROCEPHIN) 1 g in sodium chloride 0.9 % 100 mL IVPB        1 g 200 mL/hr over 30 Minutes Intravenous Every 24 hours 04/13/23 2201 04/17/23 1053   04/13/23 2130  cefTRIAXone (ROCEPHIN) 2 g in sodium chloride 0.9 % 100 mL IVPB        2 g 200 mL/hr over 30 Minutes Intravenous  Once 04/13/23 2120 04/13/23 2204       Objective: Vitals:   04/18/23 0942 04/18/23 1319  BP: 130/77 107/72  Pulse: 68 79  Resp: 16   Temp: 98.3 F (36.8 C)   SpO2: 94% 92%   No intake or output data in the 24 hours ending 04/18/23 1509  Filed Weights   04/14/23 0120  Weight: 109.9 kg   Weight change:  Body mass index is 41.59 kg/m.   Physical Exam: General exam: Pleasant, elderly Caucasian female.  Not in distress.  Morbidly obese Skin: No rashes, lesions or ulcers. HEENT: Atraumatic, normocephalic, no obvious bleeding Lungs: Clear to auscultation bilaterally,  CVS: S1, S2, no murmur,   GI/Abd: Soft, nontender, nondistended, bowel sound present,   CNS: Alert, awake, slow to respond, oriented x 3 Psychiatry: Sad affect Extremities: Chronic bilateral lower extremity nonpitting pedal edema, no calf tenderness,   Data Review: I have personally reviewed the laboratory data and studies available.  F/u labs  Unresulted Labs (From admission, onward)     Start     Ordered   04/13/23 2136  Rapid urine drug screen (hospital performed)  ONCE - STAT,   STAT        04/13/23 2136            Total time  spent in review of labs and imaging, patient evaluation, formulation of plan, documentation and communication with family: 25 minutes  Signed, Lorin Glass, MD [facility] 04/18/2023

## 2023-04-19 DIAGNOSIS — G9349 Other encephalopathy: Secondary | ICD-10-CM | POA: Diagnosis not present

## 2023-04-19 DIAGNOSIS — B999 Unspecified infectious disease: Secondary | ICD-10-CM | POA: Diagnosis not present

## 2023-04-19 MED ORDER — POLYETHYLENE GLYCOL 3350 17 G PO PACK: 17.0000 g | PACK | Freq: Every day | ORAL | Status: DC | PRN

## 2023-04-19 MED ORDER — MELATONIN 3 MG PO TABS: 3.0000 mg | ORAL_TABLET | Freq: Every evening | ORAL | Status: DC | PRN

## 2023-04-19 NOTE — Plan of Care (Signed)
   Problem: Education: Goal: Knowledge of General Education information will improve Description Including pain rating scale, medication(s)/side effects and non-pharmacologic comfort measures Outcome: Progressing

## 2023-04-19 NOTE — Progress Notes (Signed)
 PROGRESS NOTE  Donna Sexton  DOB: 04/20/1950  PCP: Agapito Games, MD ZOX:[id number]  DOA: 04/13/2023  LOS: 6 days  Hospital Day: 7  Brief narrative: Donna Sexton is a 73 y.o. female with PMH significant for morbid obesity, OSA, DM2, HTN, HLD, CHF, A-fib, GERD, chronic anemia, migraine, bipolar disorder, arthritis, asthma  Recently hospitalized 2/19 to 2/28 for sepsis secondary to COVID, encephalopathy, A-fib with RVR.  She was eventually discharged to rehab on amiodarone and Eliquis.  She was in the rehab for 4 weeks.  By the time of her release to home, she was able to ambulate with some help.  However she started to decline again at home and has been in bed for the last 3 days.  Lives with her ex-husband and sometimes caregivers at night.. 4/3, patient was brought to the ED by EMS for lethargy, increased somnolence, poor appetite, progressively worsening weakness and a fall at home.  In the ED, patient was hemodynamically stable. Labs showed WBC 9, hemoglobin 11.6, troponin normal, sodium 133, creatinine 1.07 Urinalysis showed clear yellow urine with large amount of hemoglobin, moderate leukocytes, many bacteria Urine culture was sent CT head unremarkable for acute abnormalities  Admitted to [facility]. Improved with IV antibiotics Currently pending SNF  Subjective: Patient was seen and examined this morning.  Lying on bed.  Not in distress.  No fever. Ex-husband at bedside. Pending SNF.  Assessment and plan: Acute metabolic encephalopathy Brought in for increased somnolence, lethargy Likely secondary to UTI Mental status improved back to baseline  E. coli UTI Urinalysis suggestive of infection No fever.  No leukocytosis Urine culture grew more than 1000 CFU per mL of E. coli, pansensitive Completed 5-day course of IV Rocephin on 4/7. Recent Labs  Lab 04/13/23 1750 04/14/23 0701 04/17/23 1301  WBC 9.0 8.3 7.0   Type 2 diabetes mellitus A1c 6.2 on 02/16/2023 PTA  meds-6.2 on 02/16/2023 Continue SSI/Accu-Cheks Recent Labs  Lab 04/13/23 1725  GLUCAP 104*   CHF  Hypertension Has chronic bilateral nonpitting pedal edema Continue metoprolol, Cardizem, Lasix Last BMP/7 showed BUN/creatinine 6/0.9  Paroxysmal A-fib Was in RVR in the last hospitalization Continue amiodarone, Cardizem, amiodarone Chronically anticoagulated with Eliquis  HLD Lipitor  Morbid Obesity  Body mass index is 41.59 kg/m. Patient has been advised to make an attempt to improve diet and exercise patterns to aid in weight loss.  OSA ??  Not on CPAP  Migraine, bipolar disorder PTA meds-continue Depakote, hydroxyzine, Lyrica. Continue Depakote.  Continue Lyrica twice daily.  Hydroxyzine on hold.   Continue melatonin as needed at night.  Right orbital mass CT head done on last admission on 2/18 showed an incidental 1.9 cm right inferomedial orbital mass. Radiology recommended  further evaluation with nonemergent orbital MRI with and without contrast. Repeat CT head this admission did not comment on it. 4/4, MRI orbits with contrast did not show any mass.  So I believe, the CT scan finding on 2/18 was probably an artifact.   Mobility: SNF per PT  Goals of care   Code Status: Full Code     DVT prophylaxis:   apixaban (ELIQUIS) tablet 5 mg   Antimicrobials: IV Rocephin Fluid: None Consultants: None Family Communication: Ex-husband not at bedside today  Status: Patient Level of care:  Telemetry Medical   Patient is from: Home Needs to continue in-hospital care: Pending SNF.  Medically stable   Diet:  Diet Order  Diet regular Room service appropriate? Yes; Fluid consistency: Thin  Diet effective now                   Scheduled Meds:  alum & mag hydroxide-simeth  15 mL Oral TID WC   amiodarone  200 mg Oral Daily   apixaban  5 mg Oral BID   atorvastatin  40 mg Oral Daily   diltiazem  180 mg Oral Daily   divalproex  1,500 mg Oral QHS    famotidine  20 mg Oral Daily   furosemide  80 mg Oral Daily   metoprolol tartrate  25 mg Oral BID   oxybutynin  5 mg Oral BID   pregabalin  50 mg Oral QHS   senna-docusate  2 tablet Oral QHS    PRN meds: acetaminophen **OR** acetaminophen, melatonin, ondansetron **OR** ondansetron (ZOFRAN) IV, polyethylene glycol   Infusions:     Antimicrobials: Anti-infectives (From admission, onward)    Start     Dose/Rate Route Frequency Ordered Stop   04/14/23 1000  cefTRIAXone (ROCEPHIN) 1 g in sodium chloride 0.9 % 100 mL IVPB        1 g 200 mL/hr over 30 Minutes Intravenous Every 24 hours 04/13/23 2201 04/17/23 1053   04/13/23 2130  cefTRIAXone (ROCEPHIN) 2 g in sodium chloride 0.9 % 100 mL IVPB        2 g 200 mL/hr over 30 Minutes Intravenous  Once 04/13/23 2120 04/13/23 2204       Objective: Vitals:   04/19/23 0043 04/19/23 0523  BP: 126/77 136/80  Pulse: 68 63  Resp: 16 16  Temp: 100.1 F (37.8 C) 98.4 F (36.9 C)  SpO2: (!) 89% 90%   No intake or output data in the 24 hours ending 04/19/23 0955  Filed Weights   04/14/23 0120  Weight: 109.9 kg   Weight change:  Body mass index is 41.59 kg/m.   Physical Exam: General exam: Pleasant, elderly Caucasian female.  Not in distress.  Morbidly obese Skin: No rashes, lesions or ulcers. HEENT: Atraumatic, normocephalic, no obvious bleeding Lungs: Clear to auscultation bilaterally,  CVS: S1, S2, no murmur,   GI/Abd: Soft, nontender, nondistended, bowel sound present,   CNS: Alert, awake, slow to respond, oriented x 3 Psychiatry: Sad affect Extremities: Chronic bilateral lower extremity nonpitting pedal edema, no calf tenderness,   Data Review: I have personally reviewed the laboratory data and studies available.  F/u labs  Unresulted Labs (From admission, onward)     Start     Ordered   04/13/23 2136  Rapid urine drug screen (hospital performed)  ONCE - STAT,   STAT        04/13/23 2136            Total time  spent in review of labs and imaging, patient evaluation, formulation of plan, documentation and communication with family: 25 minutes  Signed, Lorin Glass, MD [facility] 04/19/2023

## 2023-04-19 NOTE — TOC Progression Note (Signed)
 Transition of Care [facility]) - Progression Note    Patient Details  Name: Donna Sexton MRN: [id number] Date of Birth: 07-05-1950  [facility]) CM/SW Contact  Gaylynn Seiple A [place], LCSW Phone Number: 04/19/2023, 4:55 PM  Clinical Narrative:     Doyle Askew and pt's son [name] got financial agreement completed. CSW started insurance authorization for SNF, status pending.  Auth ID: [id number]   TOC will continue to follow.        Expected Discharge Plan and Services                                               Social Determinants of Health (SDOH) Interventions SDOH Screenings   Food Insecurity: No Food Insecurity (04/14/2023)  Recent Concern: Food Insecurity - Food Insecurity Present (02/13/2023)  Housing: Low Risk  (04/14/2023)  Transportation Needs: No Transportation Needs (04/14/2023)  Utilities: Not At Risk (04/14/2023)  Alcohol Screen: Low Risk  (03/28/2022)  Depression (PHQ2-9): Low Risk  (08/05/2022)  Financial Resource Strain: Low Risk  (02/13/2023)  Physical Activity: Insufficiently Active (02/13/2023)  Social Connections: Socially Isolated (04/14/2023)  Stress: Stress Concern Present (02/13/2023)  Tobacco Use: Low Risk  (04/14/2023)    Readmission Risk Interventions     No data to display

## 2023-04-19 NOTE — TOC Progression Note (Signed)
 Transition of Care [facility]) - Progression Note    Patient Details  Name: Donna Sexton MRN: [id number] Date of Birth: 1950/09/26  Transition of Care [facility]) CM/SW Contact  Onisha Cedeno A [place], LCSW Phone Number: 04/19/2023, 10:46 AM  Clinical Narrative:      CSW contacted pt's son, [name], agreeable to upfront payment before admission to [facility].CSW updated facility regarding choice for [facility] and agreement for payment and provided contact number for Josh, with his permission, to be point of contact for facility and coordination of financials.   CSW to start insurance authorization today, bed availability at facility to be determined.    TOC will continue to follow.        Expected Discharge Plan and Services                                               Social Determinants of Health (SDOH) Interventions SDOH Screenings   Food Insecurity: No Food Insecurity (04/14/2023)  Recent Concern: Food Insecurity - Food Insecurity Present (02/13/2023)  Housing: Low Risk  (04/14/2023)  Transportation Needs: No Transportation Needs (04/14/2023)  Utilities: Not At Risk (04/14/2023)  Alcohol Screen: Low Risk  (03/28/2022)  Depression (PHQ2-9): Low Risk  (08/05/2022)  Financial Resource Strain: Low Risk  (02/13/2023)  Physical Activity: Insufficiently Active (02/13/2023)  Social Connections: Socially Isolated (04/14/2023)  Stress: Stress Concern Present (02/13/2023)  Tobacco Use: Low Risk  (04/14/2023)    Readmission Risk Interventions     No data to display

## 2023-04-19 NOTE — Progress Notes (Signed)
 Mobility Specialist Progress Note:    04/19/23 1157  Mobility  Activity Transferred to/from [facility];Transferred from chair to bed  Level of Assistance Contact guard assist, steadying assist  Assistive Device Front wheel walker  Distance Ambulated (ft) 6 ft  Activity Response Tolerated well  Mobility Referral Yes  Mobility visit 1 Mobility  Mobility Specialist Start Time (ACUTE ONLY) 1130  Mobility Specialist Stop Time (ACUTE ONLY) 1140  Mobility Specialist Time Calculation (min) (ACUTE ONLY) 10 min   Pt received in chair requesting to use BSC and go back to bed. No physical assistance required, min guard for safety. No c/o throughout. Void successful. Left in bed w/ call bell and personal belongings in reach. All needs met.  Thompson Grayer Mobility Specialist  Please contact vis Secure Chat or  Rehab Office [phone]

## 2023-04-19 NOTE — Progress Notes (Signed)
 Occupational Therapy Treatment Patient Details Name: Donna Sexton MRN: [id number] DOB: 1950/10/22 Today's Date: 04/19/2023   History of present illness 73 y.o. female admitted 4/3 with AMS, lethargy, weakness, encephalopathy and UTI. PMhx: morbid obesity, OSA, DM2, HTN, HLD, CHF, Afib, GERD, chronic anemia, migraine, bipolar disorder, arthritis, asthma   OT comments  Pt was able to progress OOB mobility to hallway but is limited by impaired activity tolerance and needing 3 seated rest breaks for 52ft of mobility. Educated pt on pursed lip breathing, throughout mobility her Sp02 remained >93% but increased work of breathing noted. OT to continue following pt acutely and educate her on energy conservation strategies prn. Patient will benefit from continued inpatient follow up therapy, <3 hours/day.       If plan is discharge home, recommend the following:  A little help with walking and/or transfers;Assistance with cooking/housework;Assist for transportation;Help with stairs or ramp for entrance;A little help with bathing/dressing/bathroom   Equipment Recommendations  None recommended by OT (defer)    Recommendations for Other Services      Precautions / Restrictions Precautions Precautions: Fall Recall of Precautions/Restrictions: Impaired Restrictions Weight Bearing Restrictions Per Provider Order: No       Mobility Bed Mobility   Bed Mobility: Sit to Supine       Sit to supine: Mod assist   General bed mobility comments: Pt sitting EOB on arrival, mod  A to assist with BLE return to supine    Transfers Overall transfer level: Needs assistance Equipment used: Rolling walker (2 wheels) Transfers: Sit to/from Stand, Bed to chair/wheelchair/BSC Sit to Stand: Contact guard assist Stand pivot transfers: Contact guard assist         General transfer comment: occasional cues for hand placement with sitting and standing transitions     Balance Overall balance  assessment: Needs assistance, History of Falls Sitting-balance support: No upper extremity supported Sitting balance-Leahy Scale: Fair Sitting balance - Comments: sitting EOB CGA   Standing balance support: Bilateral upper extremity supported, Reliant on assistive device for balance, During functional activity Standing balance-Leahy Scale: Poor Standing balance comment: reliant on RW                           ADL either performed or assessed with clinical judgement   ADL                           Toilet Transfer: Contact guard assist;Stand-pivot;BSC/3in1 Statistician Details (indicate cue type and reason): HHA Toileting- Clothing Manipulation and Hygiene: Sitting/lateral lean;Set up       Functional mobility during ADLs: Contact guard assist;Rolling walker (2 wheels) General ADL Comments: Pt ambulating ~29ft total and needing 3 seated rest breaks for mobility    Extremity/Trunk Assessment              Vision       Perception     Praxis     Communication Communication Communication: No apparent difficulties   Cognition Arousal: Alert Behavior During Therapy: WFL for tasks assessed/performed Cognition: No apparent impairments                               Following commands: Intact        Cueing   Cueing Techniques: Verbal cues  Exercises      Shoulder Instructions       General Comments  Sp02 93-97% wiht ambulation on RA, reinforced pursed lip breathing prn. Despite Sp02 reading stable pt noted to have increased work of breathing.    Pertinent Vitals/ Pain       Pain Assessment Pain Assessment: No/denies pain  Home Living                                          Prior Functioning/Environment              Frequency  Min 2X/week        Progress Toward Goals  OT Goals(current goals can now be found in the care plan section)  Progress towards OT goals: Progressing toward  goals  Acute Rehab OT Goals Patient Stated Goal: To get stronger OT Goal Formulation: With patient Time For Goal Achievement: 04/29/23 Potential to Achieve Goals: Good  Plan      Co-evaluation                 AM-PAC OT "6 Clicks" Daily Activity     Outcome Measure   Help from another person eating meals?: None Help from another person taking care of personal grooming?: A Little Help from another person toileting, which includes using toliet, bedpan, or urinal?: A Little Help from another person bathing (including washing, rinsing, drying)?: A Lot Help from another person to put on and taking off regular upper body clothing?: A Little Help from another person to put on and taking off regular lower body clothing?: A Lot 6 Click Score: 17    End of Session Equipment Utilized During Treatment: Gait belt;Rolling walker (2 wheels)  OT Visit Diagnosis: Unsteadiness on feet (R26.81);Other abnormalities of gait and mobility (R26.89);Muscle weakness (generalized) (M62.81)   Activity Tolerance Patient tolerated treatment well   Patient Left in bed;with call bell/phone within reach;with bed alarm set   Nurse Communication Mobility status        Time: 7829-5621 OT Time Calculation (min): 19 min  Charges: OT General Charges $OT Visit: 1 Visit OT Treatments $Therapeutic Activity: 8-22 mins  04/19/2023  AB, OTR/L  Acute Rehabilitation Services  Office: [phone]   Tristan Schroeder 04/19/2023, 4:58 PM

## 2023-04-20 ENCOUNTER — Encounter

## 2023-04-20 DIAGNOSIS — Z6841 Body Mass Index (BMI) 40.0 and over, adult: Secondary | ICD-10-CM | POA: Diagnosis not present

## 2023-04-20 DIAGNOSIS — Z711 Person with feared health complaint in whom no diagnosis is made: Secondary | ICD-10-CM | POA: Diagnosis not present

## 2023-04-20 DIAGNOSIS — J9601 Acute respiratory failure with hypoxia: Secondary | ICD-10-CM | POA: Diagnosis not present

## 2023-04-20 DIAGNOSIS — R251 Tremor, unspecified: Secondary | ICD-10-CM | POA: Diagnosis not present

## 2023-04-20 DIAGNOSIS — N3281 Overactive bladder: Secondary | ICD-10-CM | POA: Diagnosis not present

## 2023-04-20 DIAGNOSIS — F5105 Insomnia due to other mental disorder: Secondary | ICD-10-CM | POA: Diagnosis not present

## 2023-04-20 DIAGNOSIS — I4891 Unspecified atrial fibrillation: Secondary | ICD-10-CM | POA: Diagnosis not present

## 2023-04-20 DIAGNOSIS — K219 Gastro-esophageal reflux disease without esophagitis: Secondary | ICD-10-CM | POA: Diagnosis not present

## 2023-04-20 DIAGNOSIS — F411 Generalized anxiety disorder: Secondary | ICD-10-CM | POA: Diagnosis not present

## 2023-04-20 DIAGNOSIS — Z556 Problems related to health literacy: Secondary | ICD-10-CM | POA: Diagnosis not present

## 2023-04-20 DIAGNOSIS — M5030 Other cervical disc degeneration, unspecified cervical region: Secondary | ICD-10-CM | POA: Diagnosis not present

## 2023-04-20 DIAGNOSIS — D3161 Benign neoplasm of unspecified site of right orbit: Secondary | ICD-10-CM | POA: Diagnosis not present

## 2023-04-20 DIAGNOSIS — G9349 Other encephalopathy: Secondary | ICD-10-CM | POA: Diagnosis not present

## 2023-04-20 DIAGNOSIS — R600 Localized edema: Secondary | ICD-10-CM | POA: Diagnosis not present

## 2023-04-20 DIAGNOSIS — F319 Bipolar disorder, unspecified: Secondary | ICD-10-CM | POA: Diagnosis not present

## 2023-04-20 DIAGNOSIS — E7800 Pure hypercholesterolemia, unspecified: Secondary | ICD-10-CM | POA: Diagnosis not present

## 2023-04-20 DIAGNOSIS — Z9181 History of falling: Secondary | ICD-10-CM | POA: Diagnosis not present

## 2023-04-20 DIAGNOSIS — Z993 Dependence on wheelchair: Secondary | ICD-10-CM | POA: Diagnosis not present

## 2023-04-20 DIAGNOSIS — G9340 Encephalopathy, unspecified: Secondary | ICD-10-CM | POA: Diagnosis not present

## 2023-04-20 DIAGNOSIS — H0589 Other disorders of orbit: Secondary | ICD-10-CM | POA: Diagnosis not present

## 2023-04-20 DIAGNOSIS — M6281 Muscle weakness (generalized): Secondary | ICD-10-CM | POA: Diagnosis not present

## 2023-04-20 DIAGNOSIS — I110 Hypertensive heart disease with heart failure: Secondary | ICD-10-CM | POA: Diagnosis not present

## 2023-04-20 DIAGNOSIS — I5032 Chronic diastolic (congestive) heart failure: Secondary | ICD-10-CM | POA: Diagnosis not present

## 2023-04-20 DIAGNOSIS — R404 Transient alteration of awareness: Secondary | ICD-10-CM | POA: Diagnosis not present

## 2023-04-20 DIAGNOSIS — U071 COVID-19: Secondary | ICD-10-CM | POA: Diagnosis not present

## 2023-04-20 DIAGNOSIS — M170 Bilateral primary osteoarthritis of knee: Secondary | ICD-10-CM | POA: Diagnosis not present

## 2023-04-20 DIAGNOSIS — Z7901 Long term (current) use of anticoagulants: Secondary | ICD-10-CM | POA: Diagnosis not present

## 2023-04-20 DIAGNOSIS — Z7401 Bed confinement status: Secondary | ICD-10-CM | POA: Diagnosis not present

## 2023-04-20 DIAGNOSIS — N390 Urinary tract infection, site not specified: Secondary | ICD-10-CM | POA: Diagnosis not present

## 2023-04-20 DIAGNOSIS — G2581 Restless legs syndrome: Secondary | ICD-10-CM | POA: Diagnosis not present

## 2023-04-20 DIAGNOSIS — R2689 Other abnormalities of gait and mobility: Secondary | ICD-10-CM | POA: Diagnosis not present

## 2023-04-20 DIAGNOSIS — E119 Type 2 diabetes mellitus without complications: Secondary | ICD-10-CM | POA: Diagnosis not present

## 2023-04-20 DIAGNOSIS — N179 Acute kidney failure, unspecified: Secondary | ICD-10-CM | POA: Diagnosis not present

## 2023-04-20 DIAGNOSIS — B999 Unspecified infectious disease: Secondary | ICD-10-CM | POA: Diagnosis not present

## 2023-04-20 DIAGNOSIS — F3130 Bipolar disorder, current episode depressed, mild or moderate severity, unspecified: Secondary | ICD-10-CM | POA: Diagnosis not present

## 2023-04-20 DIAGNOSIS — F4321 Adjustment disorder with depressed mood: Secondary | ICD-10-CM | POA: Diagnosis not present

## 2023-04-20 NOTE — Plan of Care (Signed)

## 2023-04-20 NOTE — TOC Progression Note (Addendum)
 Transition of Care [facility]) - Progression Note    Patient Details  Name: Donna Sexton MRN: [id number] Date of Birth: [birth year]  Transition of Care [facility]) CM/SW Contact  Russ Looper A [place], LCSW Phone Number: 04/20/2023, 11:03 AM  Clinical Narrative:      Update 1232: Auth approved for [employer]. Bed available at facility today. Provider notified. Possible DC today.    Auth remains pending for insurance with placement to [facility].   Auth ID: [id number]     TOC will continue to follow.       Expected Discharge Plan and Services                                               Social Determinants of Health (SDOH) Interventions SDOH Screenings   Food Insecurity: No Food Insecurity (04/14/2023)  Recent Concern: Food Insecurity - Food Insecurity Present (02/13/2023)  Housing: Low Risk  (04/14/2023)  Transportation Needs: No Transportation Needs (04/14/2023)  Utilities: Not At Risk (04/14/2023)  Alcohol Screen: Low Risk  (03/28/2022)  Depression (PHQ2-9): Low Risk  (08/05/2022)  Financial Resource Strain: Low Risk  (02/13/2023)  Physical Activity: Insufficiently Active (02/13/2023)  Social Connections: Socially Isolated (04/14/2023)  Stress: Stress Concern Present (02/13/2023)  Tobacco Use: Low Risk  (04/14/2023)    Readmission Risk Interventions     No data to display

## 2023-04-20 NOTE — TOC Transition Note (Signed)
 Transition of Care [facility]) - Discharge Note   Patient Details  Name: Donna Sexton MRN: [id number] Date of Birth: 1950-06-11  Transition [facility]) CM/SW Contact:  Maleigha Colvard A [place], LCSW Phone Number: 04/20/2023, 2:40 PM   Clinical Narrative:     Patient will DC to: [facility]  Anticipated DC date: 04/20/23  Family notified: Foy Guadalajara  Transport by: Theodoro Grist Approved Dates:  04/20/2023-04/24/2023  Auth ID:  [id number]     Per MD patient ready for DC to  . RN, patient, patient's family, and facility notified of DC. Discharge Summary and FL2 sent to facility. RN to call report prior to discharge (Room 1204p, [phone]). DC packet on chart. Ambulance transport requested for patient.     CSW will sign off for now as social work intervention is no longer needed. Please consult [place] again if new needs arise.   Final next level of care: Skilled Nursing Facility Barriers to Discharge: Barriers Resolved   Patient Goals and CMS Choice            Discharge Placement              Patient [facility] bed at: [facility] Patient to be transferred to facility by: [facility] Name of family member notified: Foy Guadalajara Patient and family notified of of transfer: 04/20/23  Discharge Plan and Services Additional resources added to the After Visit Summary for                                       Social Drivers of Health (SDOH) Interventions SDOH Screenings   Food Insecurity: No Food Insecurity (04/14/2023)  Recent Concern: Food Insecurity - Food Insecurity Present (02/13/2023)  Housing: Low Risk  (04/14/2023)  Transportation Needs: No Transportation Needs (04/14/2023)  Utilities: Not At Risk (04/14/2023)  Alcohol Screen: Low Risk  (03/28/2022)  Depression (PHQ2-9): Low Risk  (08/05/2022)  Financial Resource Strain: Low Risk  (02/13/2023)  Physical Activity: Insufficiently Active (02/13/2023)  Social Connections: Socially Isolated (04/14/2023)  Stress: Stress  Concern Present (02/13/2023)  Tobacco Use: Low Risk  (04/14/2023)     Readmission Risk Interventions     No data to display

## 2023-04-20 NOTE — Plan of Care (Signed)
   Problem: Education: Goal: Knowledge of General Education information will improve Description Including pain rating scale, medication(s)/side effects and non-pharmacologic comfort measures Outcome: Progressing   Problem: Health Behavior/Discharge Planning: Goal: Ability to manage health-related needs will improve Outcome: Progressing

## 2023-04-20 NOTE — Discharge Summary (Signed)
 Physician Discharge Summary  Donna Sexton ZOX:[id number] DOB: 05/27/1950 DOA: 04/13/2023  PCP: Agapito Games, MD  Admit date: 04/13/2023 Discharge date: 04/20/2023  Admitted From: Home Discharge disposition: SNF  Recommendations at discharge:  Improve participation with physical therapy.  Brief narrative: Donna Sexton is a 73 y.o. female with PMH significant for morbid obesity, OSA, DM2, HTN, HLD, CHF, A-fib, GERD, chronic anemia, migraine, bipolar disorder, arthritis, asthma  Recently hospitalized 2/19 to 2/28 for sepsis secondary to COVID, encephalopathy, A-fib with RVR.  She was eventually discharged to rehab on amiodarone and Eliquis.  She was in the rehab for 4 weeks.  By the time of her release to home, she was able to ambulate with some help.  However she started to decline again at home and has been in bed for the last 3 days.  Lives with her ex-husband and sometimes caregivers at night.. 4/3, patient was brought to the ED by EMS for lethargy, increased somnolence, poor appetite, progressively worsening weakness and a fall at home.  In the ED, patient was hemodynamically stable. Labs showed WBC 9, hemoglobin 11.6, troponin normal, sodium 133, creatinine 1.07 Urinalysis showed clear yellow urine with large amount of hemoglobin, moderate leukocytes, many bacteria Urine culture was sent CT head unremarkable for acute abnormalities  Admitted to [facility]. Improved with IV antibiotics Currently pending SNF  Subjective: Patient was seen and examined this morning.  Lying on bed.  Not in distress.  No fever. Ex-husband at bedside. SNF approval obtained.  Hospital course: Acute metabolic encephalopathy Brought in for increased somnolence, lethargy Likely secondary to UTI Mental status improved back to baseline  E. coli UTI Urinalysis on admission was suggestive of infection No fever.  No leukocytosis Urine culture grew more than 1000 CFU per mL of E. coli,  pansensitive Completed 5-day course of IV Rocephin on 4/7. Recent Labs  Lab 04/13/23 1750 04/14/23 0701 04/17/23 1301  WBC 9.0 8.3 7.0   Type 2 diabetes mellitus A1c 6.2 on 02/16/2023 PTA meds-6.2 on 02/16/2023 Continue SSI/Accu-Cheks Recent Labs  Lab 04/13/23 1725  GLUCAP 104*   CHF  Hypertension Has chronic bilateral nonpitting pedal edema Continue metoprolol, Cardizem, Lasix Last BMP/7 showed BUN/creatinine 6/0.9  Paroxysmal A-fib Was in RVR in the last hospitalization Continue amiodarone, Cardizem, amiodarone Chronically anticoagulated with Eliquis  HLD Lipitor  Morbid Obesity  Body mass index is 41.59 kg/m. Patient has been advised to make an attempt to improve diet and exercise patterns to aid in weight loss.  OSA ??  Not on CPAP  Migraine, bipolar disorder PTA meds-continue Depakote, hydroxyzine, Lyrica. Continue Depakote.  Continue Lyrica twice daily.  Hydroxyzine on hold.   Continue melatonin as needed at night.  Right orbital mass CT head done on last admission on 2/18 showed an incidental 1.9 cm right inferomedial orbital mass. Radiology recommended  further evaluation with nonemergent orbital MRI with and without contrast. Repeat CT head this admission did not comment on it. 4/4, MRI orbits with contrast did not show any mass.  So I believe, the CT scan finding on 2/18 was probably an artifact.   Mobility: SNF per PT  Goals of care   Code Status: Full Code   Consultants: None Family Communication: Ex-husband at bedside   Diet:  Diet Order             Diet general           Diet regular Room service appropriate? Yes; Fluid consistency: Thin  Diet effective now  Nutritional status:  Body mass index is 41.59 kg/m.       Wounds:  - Wound / Incision (Open or Dehisced) 03/03/23 Skin tear Arm Anterior;Left;Lower Opened blister (Active)  Date First Assessed/Time First Assessed: 03/03/23 0427   Wound Type: Skin tear   Location: Arm  Location Orientation: Anterior;Left;Lower  Wound Description (Comments): Opened blister  Present on Admission: Yes    Assessments 03/03/2023  4:31 AM 03/10/2023  9:00 AM  Dressing Type Bismuth petroleum Impregnated gauze (bismuth)  Dressing Changed New --  Dressing Status Clean, Dry, Intact Clean, Dry, Intact  Dressing Change Frequency PRN Every 3 days  Site / Wound Assessment Red;Clean Dressing in place / Unable to assess  Peri-wound Assessment Pink --  Wound Length (cm) 1.8 cm --  Wound Width (cm) 2.5 cm --  Wound Depth (cm) 0 cm --  Wound Volume (cm^3) 0 cm^3 --  Wound Surface Area (cm^2) 4.5 cm^2 --  Tunneling (cm) 0 --  Undermining (cm) 0 --  Margins Unattached edges (unapproximated) --  Drainage Amount Scant --  Drainage Description Serous --  Non-staged Wound Description Partial thickness --  Treatment Cleansed --     Inactive Orders  Date Order Priority Status Authorizing Provider  03/03/23 0456 Bismuth/Petroleum (Xeroform) gauze Routine Discontinued Zannie Cove, MD     Wound / Incision (Open or Dehisced) 03/03/23 Skin tear Chest Lateral;Left;Upper opened blister (Active)  Date First Assessed/Time First Assessed: 03/03/23 0453   Wound Type: Skin tear  Location: Chest  Location Orientation: Lateral;Left;Upper  Wound Description (Comments): opened blister  Present on Admission: Yes    Assessments 03/03/2023  4:31 AM 03/10/2023  9:00 AM  Dressing Type Foam - Lift dressing to assess site every shift;Bismuth petroleum Foam - Lift dressing to assess site every shift  Dressing Changed New --  Dressing Status Clean, Dry, Intact Clean, Dry, Intact  Dressing Change Frequency PRN Every 3 days  Site / Wound Assessment Red --  Peri-wound Assessment Pink --  Wound Length (cm) 5.4 cm --  Wound Width (cm) 3.2 cm --  Wound Surface Area (cm^2) 17.28 cm^2 --  Margins Unattached edges (unapproximated) --  Drainage Amount Scant --  Drainage Description Serous --   Non-staged Wound Description Partial thickness --  Treatment Cleansed --     Inactive Orders  Date Order Priority Status Authorizing Provider  03/03/23 0456 Bismuth/Petroleum (Xeroform) gauze Routine Discontinued Zannie Cove, MD     Wound / Incision (Open or Dehisced) 04/18/23 Skin tear Thigh Distal;Left;Posterior Patient was pinched by the bedside commode (Active)  Date First Assessed/Time First Assessed: 04/18/23 1231   Wound Type: Skin tear  Location: Thigh  Location Orientation: Distal;Left;Posterior  Wound Description (Comments): Patient was pinched by the bedside commode  Present on Admission: No    Assessments 04/18/2023 12:20 PM 04/19/2023  8:00 PM  Dressing Type Gauze (Comment) Foam - Lift dressing to assess site every shift  Dressing Changed New --  Dressing Status Clean, Dry, Intact Clean, Dry, Intact  Site / Wound Assessment Bleeding;Painful;Pale;Pink;Red Pale;Pink  Peri-wound Assessment Intact;Pink --  Drainage Amount Scant --  Drainage Description Sanguineous --  Treatment Cleansed;Other (Comment) --     No associated orders.    Discharge Exam:   Vitals:   04/19/23 2034 04/20/23 0025 04/20/23 0300 04/20/23 0809  BP: 118/82 122/77 (!) 114/59 125/67  Pulse: 82 63 63 66  Resp: 18 18 18 18   Temp: 98.9 F (37.2 C) 97.9 F (36.6 C) 98 F (36.7 C) 98.2 F (  36.8 C)  TempSrc: Oral Oral Oral Oral  SpO2: 94% 90% 95% 95%  Weight:      Height:        Body mass index is 41.59 kg/m.  General exam: Pleasant, elderly Caucasian female.  Not in distress.  Morbidly obese Skin: No rashes, lesions or ulcers. HEENT: Atraumatic, normocephalic, no obvious bleeding Lungs: Clear to auscultation bilaterally,  CVS: S1, S2, no murmur,   GI/Abd: Soft, nontender, nondistended, bowel sound present,   CNS: Alert, awake, slow to respond, oriented x 3 Psychiatry: Sad affect Extremities: Chronic bilateral lower extremity nonpitting pedal edema, no calf tenderness,   Follow ups:     Follow-up Information     Agapito Games, MD Follow up.   Specialty: Family Medicine Contact information: [address] [place] [address] [phone]                 Discharge Instructions:   Discharge Instructions     Call MD for:  difficulty breathing, headache or visual disturbances   Complete by: As directed    Call MD for:  extreme fatigue   Complete by: As directed    Call MD for:  hives   Complete by: As directed    Call MD for:  persistant dizziness or light-headedness   Complete by: As directed    Call MD for:  persistant nausea and vomiting   Complete by: As directed    Call MD for:  severe uncontrolled pain   Complete by: As directed    Call MD for:  temperature >100.4   Complete by: As directed    Diet general   Complete by: As directed    Discharge instructions   Complete by: As directed    Recommendations at discharge:   Improve participation with physical therapy.  Discharge instructions for diabetes mellitus: Check blood sugar 3 times a day and bedtime at home. If blood sugar running above 200 or less than 70 please call your MD to adjust insulin. If you notice signs and symptoms of hypoglycemia (low blood sugar) like jitteriness, confusion, thirst, tremor and sweating, please check blood sugar, drink sugary drink/biscuits/sweets to increase sugar level and call MD or return to ER.      Discharge instructions for CHF Check weight daily -preferably same time every day. Restrict fluid intake to 1200 ml daily Restrict salt intake to less than 2 g daily. Call MD if you have one of the following symptoms 1) 3 pound weight gain in 24 hours or 5 pounds in 1 week  2) swelling in the hands, feet or stomach  3) progressive shortness of breath 4) if you have to sleep on extra pillows at night in order to breathe       General discharge instructions: Follow with Primary MD Agapito Games, MD in 7 days   Please request your PCP  to go over your hospital tests, procedures, radiology results at the follow up. Please get your medicines reviewed and adjusted.  Your PCP may decide to repeat certain labs or tests as needed. Do not drive, operate heavy machinery, perform activities at heights, swimming or participation in water activities or provide baby sitting services if your were admitted for syncope or siezures until you have seen by Primary MD or a Neurologist and advised to do so again. [place] Controlled Substance Reporting System database was reviewed. Do not drive, operate heavy machinery, perform activities at heights, swim, participate in water  activities or provide baby-sitting services while on medications for pain, sleep and mood until your outpatient physician has reevaluated you and advised to do so again.  You are strongly recommended to comply with the dose, frequency and duration of prescribed medications. Activity: As tolerated with Full fall precautions use walker/cane & assistance as needed Avoid using any recreational substances like cigarette, tobacco, alcohol, or non-prescribed drug. If you experience worsening of your admission symptoms, develop shortness of breath, life threatening emergency, suicidal or homicidal thoughts you must seek medical attention immediately by calling 911 or calling your MD immediately  if symptoms less severe. You must read complete instructions/literature along with all the possible adverse reactions/side effects for all the medicines you take and that have been prescribed to you. Take any new medicine only after you have completely understood and accepted all the possible adverse reactions/side effects.  Wear Seat belts while driving. You were cared for by a hospitalist during your hospital stay. If you have any questions about your discharge medications or the care you received while you were in the hospital after you are discharged, you can call  the unit and ask to speak with the hospitalist or the covering physician. Once you are discharged, your primary care physician will handle any further medical issues. Please note that NO REFILLS for any discharge medications will be authorized once you are discharged, as it is imperative that you return to your primary care physician (or establish a relationship with a primary care physician if you do not have one).   Discharge wound care:   Complete by: As directed    Increase activity slowly   Complete by: As directed        Discharge Medications:   Allergies as of 04/20/2023       Reactions   Other Other (See Comments)   Allergic to muscle relaxers -unknown reaction Flexeril only can take cyclobenzaprine.   Morphine Nausea And Vomiting   Other reaction(s): Other (See Comments) Unspecified reaction        Medication List     STOP taking these medications    hydrOXYzine 25 MG tablet Commonly known as: ATARAX   potassium chloride SA 20 MEQ tablet Commonly known as: KLOR-CON M       TAKE these medications    acetaminophen 500 MG tablet Commonly known as: TYLENOL Take 1 tablet (500 mg total) by mouth every 6 (six) hours as needed for mild pain (pain score 1-3) (or Fever >/= 101).   amiodarone 200 MG tablet Commonly known as: PACERONE Take 1 tablet (200 mg total) by mouth daily.   apixaban 5 MG Tabs tablet Commonly known as: ELIQUIS Take 1 tablet (5 mg total) by mouth 2 (two) times daily.   atorvastatin 40 MG tablet Commonly known as: LIPITOR Take 1 tablet (40 mg total) by mouth daily.   diltiazem 180 MG 24 hr capsule Commonly known as: CARDIZEM CD Take 1 capsule (180 mg total) by mouth daily.   divalproex 500 MG 24 hr tablet Commonly known as: DEPAKOTE ER Take 3 tablets (1,500 mg total) by mouth at bedtime.   famotidine 20 MG tablet Commonly known as: PEPCID Take 20 mg by mouth daily.   furosemide 80 MG tablet Commonly known as: Lasix TAKE 80 MG  DAILY. MAY TAKE AN ADDITIONAL HALF TO WHOLE TABLET IF YOU GAIN 3 POUNDS OR MORE OVER NIGHT OR 5 POUNDS OR MORE IN ONE WEEK.   levalbuterol 0.63 MG/3ML nebulizer solution Commonly known as:  XOPENEX Take 3 mLs (0.63 mg total) by nebulization every 6 (six) hours as needed for wheezing or shortness of breath.   melatonin 3 MG Tabs tablet Take 1 tablet (3 mg total) by mouth at bedtime as needed.   metoprolol tartrate 25 MG tablet Commonly known as: LOPRESSOR Take 1 tablet (25 mg total) by mouth 2 (two) times daily.   mometasone-formoterol 100-5 MCG/ACT Aero Commonly known as: DULERA Inhale 2 puffs into the lungs 2 (two) times daily.   oxybutynin 5 MG tablet Commonly known as: DITROPAN Take 1 tablet (5 mg total) by mouth 2 (two) times daily.   polyethylene glycol 17 g packet Commonly known as: MIRALAX / GLYCOLAX Take 17 g by mouth daily as needed for moderate constipation. What changed:  when to take this reasons to take this   pregabalin 50 MG capsule Commonly known as: LYRICA Take 1 capsule (50 mg total) by mouth 3 (three) times daily.   promethazine 25 MG tablet Commonly known as: PHENERGAN TAKE ONE TABLET BY MOUTH EVERY 8 HOURS AS NEEDED FOR NAUSEA AND VOMITING   senna-docusate 8.6-50 MG tablet Commonly known as: Senokot-S Take 2 tablets by mouth at bedtime.               Discharge Care Instructions  (From admission, onward)           Start     Ordered   04/20/23 0000  Discharge wound care:        04/20/23 1250             The results of significant diagnostics from this hospitalization (including imaging, microbiology, ancillary and laboratory) are listed below for reference.    Procedures and Diagnostic Studies:   MR ORBITS W WO CONTRAST Result Date: 04/14/2023 CLINICAL DATA:  Right orbital mass EXAM: MRI OF THE ORBITS WITHOUT AND WITH CONTRAST TECHNIQUE: Multiplanar, multi-echo pulse sequences of the orbits and surrounding structures were  acquired including fat saturation techniques, before and after intravenous contrast administration. CONTRAST:  8mL GADAVIST GADOBUTROL 1 MMOL/ML IV SOLN COMPARISON:  Head CT 03/01/2023 FINDINGS: Orbits: No orbital mass or evidence of inflammation. Normal appearance of the globes, optic nerve-sheath complexes, extraocular muscles, orbital fat and lacrimal glands. There is no finding to correlate to the abnormality seen in the medial right orbit on the 03/01/2023 head CT. Visualized sinuses: Clear. Soft tissues: Normal. Limited intracranial: No acute or significant finding. IMPRESSION: Normal MRI of the orbits. No finding to correlate to the abnormality seen in the medial right orbit on the 03/01/2023 head CT. This may have been a thrombosed varix of the inferior ophthalmic vein. Electronically Signed   By: Deatra Robinson M.D.   On: 04/14/2023 20:10   CT Head Wo Contrast Result Date: 04/13/2023 CLINICAL DATA:  Altered level of consciousness, lethargy, somnolent EXAM: CT HEAD WITHOUT CONTRAST TECHNIQUE: Contiguous axial images were obtained from the base of the skull through the vertex without intravenous contrast. RADIATION DOSE REDUCTION: This exam was performed according to the departmental dose-optimization program which includes automated exposure control, adjustment of the mA and/or kV according to patient size and/or use of iterative reconstruction technique. COMPARISON:  03/01/2023 FINDINGS: Brain: No acute infarct or hemorrhage. Lateral ventricles and midline structures are stable. No acute extra-axial fluid collections. No mass effect. Vascular: No hyperdense vessel or unexpected calcification. Skull: Normal. Negative for fracture or focal lesion. Sinuses/Orbits: Mild mucosal thickening within the ethmoid air cells. The remaining paranasal sinuses are clear. Other: None. IMPRESSION: 1. Stable  head CT, no acute intracranial process. Electronically Signed   By: Sharlet Salina M.D.   On: 04/13/2023 20:03   DG  Chest Port 1 View Result Date: 04/13/2023 CLINICAL DATA:  Weakness EXAM: PORTABLE CHEST 1 VIEW COMPARISON:  Chest x-ray 03/04/2023 FINDINGS: The heart size and mediastinal contours are within normal limits. Both lungs are clear. The visualized skeletal structures are unremarkable. IMPRESSION: No active disease. Electronically Signed   By: Darliss Cheney M.D.   On: 04/13/2023 17:59     Labs:   Basic Metabolic Panel: Recent Labs  Lab 04/13/23 1750 04/13/23 1829 04/14/23 0701 04/17/23 1301  NA 133* 131* 135 134*  K 3.6 3.4* 3.5 4.4  CL 97*  --  98 94*  CO2 25  --  25 28  GLUCOSE 92  --  88 141*  BUN 15  --  13 6*  CREATININE 1.07*  --  0.84 0.90  CALCIUM 8.9  --  8.9 9.2  MG 1.7  --  1.7  --    GFR Estimated Creatinine Clearance: 66.5 mL/min (by C-G formula based on SCr of 0.9 mg/dL). Liver Function Tests: Recent Labs  Lab 04/13/23 1750  AST 20  ALT 14  ALKPHOS 52  BILITOT 1.3*  PROT 5.4*  ALBUMIN 2.9*   No results for input(s): "LIPASE", "AMYLASE" in the last 168 hours. Recent Labs  Lab 04/13/23 1750  AMMONIA 15   Coagulation profile No results for input(s): "INR", "PROTIME" in the last 168 hours.  CBC: Recent Labs  Lab 04/13/23 1750 04/13/23 1829 04/14/23 0701 04/17/23 1301  WBC 9.0  --  8.3 7.0  NEUTROABS 5.7  --   --   --   HGB 11.6* 11.2* 11.3* 13.9  HCT 33.3* 33.0* 33.0* 39.6  MCV 97.4  --  97.3 96.6  PLT 195  --  166 232   Cardiac Enzymes: No results for input(s): "CKTOTAL", "CKMB", "CKMBINDEX", "TROPONINI" in the last 168 hours. BNP: Invalid input(s): "POCBNP" CBG: Recent Labs  Lab 04/13/23 1725  GLUCAP 104*   D-Dimer No results for input(s): "DDIMER" in the last 72 hours. Hgb A1c No results for input(s): "HGBA1C" in the last 72 hours. Lipid Profile No results for input(s): "CHOL", "HDL", "LDLCALC", "TRIG", "CHOLHDL", "LDLDIRECT" in the last 72 hours. Thyroid function studies No results for input(s): "TSH", "T4TOTAL", "T3FREE",  "THYROIDAB" in the last 72 hours.  Invalid input(s): "FREET3" Anemia work up No results for input(s): "VITAMINB12", "FOLATE", "FERRITIN", "TIBC", "IRON", "RETICCTPCT" in the last 72 hours. Microbiology Recent Results (from the past 240 hours)  Urine Culture     Status: Abnormal   Collection Time: 04/13/23  8:07 PM   Specimen: Urine, Random  Result Value Ref Range Status   Specimen Description URINE, RANDOM  Final   Special Requests   Final    NONE Reflexed from [id number] Performed at [facility], [address] [place], [place] [address]    Culture >=100,000 COLONIES/mL ESCHERICHIA COLI (A)  Final   Report Status 04/16/2023 FINAL  Final   Organism ID, Bacteria ESCHERICHIA COLI (A)  Final      Susceptibility   Escherichia coli - MIC*    AMPICILLIN >=32 RESISTANT Resistant     CEFAZOLIN <=4 SENSITIVE Sensitive     CEFEPIME <=0.12 SENSITIVE Sensitive     CEFTRIAXONE <=0.25 SENSITIVE Sensitive     CIPROFLOXACIN <=0.25 SENSITIVE Sensitive     GENTAMICIN <=1 SENSITIVE Sensitive     IMIPENEM <=0.25 SENSITIVE Sensitive  NITROFURANTOIN <=16 SENSITIVE Sensitive     TRIMETH/SULFA >=320 RESISTANT Resistant     AMPICILLIN/SULBACTAM 16 INTERMEDIATE Intermediate     PIP/TAZO <=4 SENSITIVE Sensitive ug/mL    * >=100,000 COLONIES/mL ESCHERICHIA COLI    Time coordinating discharge: 45 minutes  Signed: Psalm Schappell  [facility] 04/20/2023, 12:50 PM

## 2023-04-20 NOTE — Progress Notes (Signed)
 [facility] and spoke with Marchelle Folks to give report on pt.

## 2023-04-20 NOTE — Progress Notes (Signed)
 Mobility Specialist: Progress Note   04/20/23 1140  Mobility  Activity Ambulated with assistance in hallway  Level of Assistance Contact guard assist, steadying assist  Assistive Device Front wheel walker  Distance Ambulated (ft) 150 ft  Activity Response Tolerated well  Mobility Referral Yes  Mobility visit 1 Mobility  Mobility Specialist Start Time (ACUTE ONLY) 1004  Mobility Specialist Stop Time (ACUTE ONLY) 1023  Mobility Specialist Time Calculation (min) (ACUTE ONLY) 19 min    Pt was agreeable to mobility session - received in bed. SV for bed mobility, CG for STS and ambulation. Took 3x seated break throughout d/t fatigue and SOB. VSS on RA throughout. Declined sitting in the recliner at this time d/t back pain. Left in bed with all needs met, call bell in reach. Family in room.   Maurene Capes Mobility Specialist Please contact via SecureChat or Rehab office at [phone]

## 2023-04-22 ENCOUNTER — Telehealth: Payer: Self-pay | Admitting: Family Medicine

## 2023-04-22 NOTE — Telephone Encounter (Signed)
 Patient is at Rehab at [facility].  They have her listed as a diabetic.  Please call them and let her know she is prediabetic but doesn't have diabetes.

## 2023-04-24 DIAGNOSIS — H0589 Other disorders of orbit: Secondary | ICD-10-CM | POA: Diagnosis not present

## 2023-04-24 DIAGNOSIS — M170 Bilateral primary osteoarthritis of knee: Secondary | ICD-10-CM | POA: Diagnosis not present

## 2023-04-24 DIAGNOSIS — G9340 Encephalopathy, unspecified: Secondary | ICD-10-CM | POA: Diagnosis not present

## 2023-04-24 DIAGNOSIS — R251 Tremor, unspecified: Secondary | ICD-10-CM | POA: Diagnosis not present

## 2023-04-24 DIAGNOSIS — R2689 Other abnormalities of gait and mobility: Secondary | ICD-10-CM | POA: Diagnosis not present

## 2023-04-24 DIAGNOSIS — Z711 Person with feared health complaint in whom no diagnosis is made: Secondary | ICD-10-CM | POA: Diagnosis not present

## 2023-04-24 NOTE — Telephone Encounter (Signed)
 Attempted call to [facility]  at [phone] Spoke to receptionist and was being transferred to patient's nurse. Phone rang for several minutes and then was disconnected.  Will call again at later time.

## 2023-04-24 NOTE — Telephone Encounter (Signed)
 Spoke with Building control surveyor . Informed of the error and will adjust patient chart accordingly.

## 2023-04-26 DIAGNOSIS — G9340 Encephalopathy, unspecified: Secondary | ICD-10-CM | POA: Diagnosis not present

## 2023-04-26 DIAGNOSIS — R2689 Other abnormalities of gait and mobility: Secondary | ICD-10-CM | POA: Diagnosis not present

## 2023-04-26 DIAGNOSIS — M170 Bilateral primary osteoarthritis of knee: Secondary | ICD-10-CM | POA: Diagnosis not present

## 2023-05-01 DIAGNOSIS — F3130 Bipolar disorder, current episode depressed, mild or moderate severity, unspecified: Secondary | ICD-10-CM | POA: Diagnosis not present

## 2023-05-01 DIAGNOSIS — F5105 Insomnia due to other mental disorder: Secondary | ICD-10-CM | POA: Diagnosis not present

## 2023-05-01 DIAGNOSIS — R2689 Other abnormalities of gait and mobility: Secondary | ICD-10-CM | POA: Diagnosis not present

## 2023-05-01 DIAGNOSIS — M170 Bilateral primary osteoarthritis of knee: Secondary | ICD-10-CM | POA: Diagnosis not present

## 2023-05-01 DIAGNOSIS — G9340 Encephalopathy, unspecified: Secondary | ICD-10-CM | POA: Diagnosis not present

## 2023-05-01 DIAGNOSIS — F4321 Adjustment disorder with depressed mood: Secondary | ICD-10-CM | POA: Diagnosis not present

## 2023-05-03 DIAGNOSIS — R2689 Other abnormalities of gait and mobility: Secondary | ICD-10-CM | POA: Diagnosis not present

## 2023-05-03 DIAGNOSIS — M170 Bilateral primary osteoarthritis of knee: Secondary | ICD-10-CM | POA: Diagnosis not present

## 2023-05-03 DIAGNOSIS — I5032 Chronic diastolic (congestive) heart failure: Secondary | ICD-10-CM | POA: Diagnosis not present

## 2023-05-03 DIAGNOSIS — G9340 Encephalopathy, unspecified: Secondary | ICD-10-CM | POA: Diagnosis not present

## 2023-05-03 DIAGNOSIS — I4891 Unspecified atrial fibrillation: Secondary | ICD-10-CM | POA: Diagnosis not present

## 2023-05-03 DIAGNOSIS — I110 Hypertensive heart disease with heart failure: Secondary | ICD-10-CM | POA: Diagnosis not present

## 2023-05-08 DIAGNOSIS — R600 Localized edema: Secondary | ICD-10-CM | POA: Diagnosis not present

## 2023-05-10 DIAGNOSIS — R2689 Other abnormalities of gait and mobility: Secondary | ICD-10-CM | POA: Diagnosis not present

## 2023-05-10 DIAGNOSIS — I5032 Chronic diastolic (congestive) heart failure: Secondary | ICD-10-CM | POA: Diagnosis not present

## 2023-05-10 DIAGNOSIS — F319 Bipolar disorder, unspecified: Secondary | ICD-10-CM | POA: Diagnosis not present

## 2023-05-10 DIAGNOSIS — M170 Bilateral primary osteoarthritis of knee: Secondary | ICD-10-CM | POA: Diagnosis not present

## 2023-05-10 DIAGNOSIS — M5030 Other cervical disc degeneration, unspecified cervical region: Secondary | ICD-10-CM | POA: Diagnosis not present

## 2023-05-10 DIAGNOSIS — I110 Hypertensive heart disease with heart failure: Secondary | ICD-10-CM | POA: Diagnosis not present

## 2023-05-10 DIAGNOSIS — G9340 Encephalopathy, unspecified: Secondary | ICD-10-CM | POA: Diagnosis not present

## 2023-05-12 ENCOUNTER — Other Ambulatory Visit: Payer: Self-pay | Admitting: Family Medicine

## 2023-05-12 DIAGNOSIS — I110 Hypertensive heart disease with heart failure: Secondary | ICD-10-CM | POA: Diagnosis not present

## 2023-05-12 DIAGNOSIS — F319 Bipolar disorder, unspecified: Secondary | ICD-10-CM | POA: Diagnosis not present

## 2023-05-12 DIAGNOSIS — I5032 Chronic diastolic (congestive) heart failure: Secondary | ICD-10-CM | POA: Diagnosis not present

## 2023-05-12 DIAGNOSIS — R600 Localized edema: Secondary | ICD-10-CM | POA: Diagnosis not present

## 2023-05-13 DIAGNOSIS — J9601 Acute respiratory failure with hypoxia: Secondary | ICD-10-CM | POA: Diagnosis not present

## 2023-05-13 DIAGNOSIS — N3281 Overactive bladder: Secondary | ICD-10-CM | POA: Diagnosis not present

## 2023-05-13 DIAGNOSIS — G2581 Restless legs syndrome: Secondary | ICD-10-CM | POA: Diagnosis not present

## 2023-05-13 DIAGNOSIS — Z9181 History of falling: Secondary | ICD-10-CM | POA: Diagnosis not present

## 2023-05-13 DIAGNOSIS — N179 Acute kidney failure, unspecified: Secondary | ICD-10-CM | POA: Diagnosis not present

## 2023-05-13 DIAGNOSIS — F319 Bipolar disorder, unspecified: Secondary | ICD-10-CM | POA: Diagnosis not present

## 2023-05-13 DIAGNOSIS — D3161 Benign neoplasm of unspecified site of right orbit: Secondary | ICD-10-CM | POA: Diagnosis not present

## 2023-05-13 DIAGNOSIS — Z556 Problems related to health literacy: Secondary | ICD-10-CM | POA: Diagnosis not present

## 2023-05-13 DIAGNOSIS — U071 COVID-19: Secondary | ICD-10-CM | POA: Diagnosis not present

## 2023-05-13 DIAGNOSIS — E7800 Pure hypercholesterolemia, unspecified: Secondary | ICD-10-CM | POA: Diagnosis not present

## 2023-05-13 DIAGNOSIS — I110 Hypertensive heart disease with heart failure: Secondary | ICD-10-CM | POA: Diagnosis not present

## 2023-05-13 DIAGNOSIS — K219 Gastro-esophageal reflux disease without esophagitis: Secondary | ICD-10-CM | POA: Diagnosis not present

## 2023-05-13 DIAGNOSIS — M5030 Other cervical disc degeneration, unspecified cervical region: Secondary | ICD-10-CM | POA: Diagnosis not present

## 2023-05-13 DIAGNOSIS — Z6841 Body Mass Index (BMI) 40.0 and over, adult: Secondary | ICD-10-CM | POA: Diagnosis not present

## 2023-05-13 DIAGNOSIS — I4891 Unspecified atrial fibrillation: Secondary | ICD-10-CM | POA: Diagnosis not present

## 2023-05-13 DIAGNOSIS — E119 Type 2 diabetes mellitus without complications: Secondary | ICD-10-CM | POA: Diagnosis not present

## 2023-05-13 DIAGNOSIS — Z7901 Long term (current) use of anticoagulants: Secondary | ICD-10-CM | POA: Diagnosis not present

## 2023-05-13 DIAGNOSIS — Z993 Dependence on wheelchair: Secondary | ICD-10-CM | POA: Diagnosis not present

## 2023-05-13 DIAGNOSIS — M170 Bilateral primary osteoarthritis of knee: Secondary | ICD-10-CM | POA: Diagnosis not present

## 2023-05-13 DIAGNOSIS — F411 Generalized anxiety disorder: Secondary | ICD-10-CM | POA: Diagnosis not present

## 2023-05-13 DIAGNOSIS — I5032 Chronic diastolic (congestive) heart failure: Secondary | ICD-10-CM | POA: Diagnosis not present

## 2023-05-15 ENCOUNTER — Telehealth: Payer: Self-pay

## 2023-05-15 NOTE — Transitions of Care (Post Inpatient/ED Visit) (Unsigned)
   05/15/2023  Name: Donna Sexton MRN: [id number] DOB: 04/01/1950  Today's TOC FU Call Status: Today's TOC FU Call Status:: Unsuccessful Call (1st Attempt) Unsuccessful Call (1st Attempt) Date: 05/15/23  Attempted to reach the patient regarding the most recent Inpatient/ED visit.  Follow Up Plan: Additional outreach attempts will be made to reach the patient to complete the Transitions of Care (Post Inpatient/ED visit) call.   Signature Darrall Ellison, LPN [facility] Nurse Health Advisor Direct Dial [phone]

## 2023-05-16 DIAGNOSIS — I110 Hypertensive heart disease with heart failure: Secondary | ICD-10-CM | POA: Diagnosis not present

## 2023-05-16 DIAGNOSIS — M5030 Other cervical disc degeneration, unspecified cervical region: Secondary | ICD-10-CM | POA: Diagnosis not present

## 2023-05-16 DIAGNOSIS — Z9181 History of falling: Secondary | ICD-10-CM | POA: Diagnosis not present

## 2023-05-16 DIAGNOSIS — N3281 Overactive bladder: Secondary | ICD-10-CM | POA: Diagnosis not present

## 2023-05-16 DIAGNOSIS — D3161 Benign neoplasm of unspecified site of right orbit: Secondary | ICD-10-CM | POA: Diagnosis not present

## 2023-05-16 DIAGNOSIS — E119 Type 2 diabetes mellitus without complications: Secondary | ICD-10-CM | POA: Diagnosis not present

## 2023-05-16 DIAGNOSIS — G2581 Restless legs syndrome: Secondary | ICD-10-CM | POA: Diagnosis not present

## 2023-05-16 DIAGNOSIS — N179 Acute kidney failure, unspecified: Secondary | ICD-10-CM | POA: Diagnosis not present

## 2023-05-16 DIAGNOSIS — E7800 Pure hypercholesterolemia, unspecified: Secondary | ICD-10-CM | POA: Diagnosis not present

## 2023-05-16 DIAGNOSIS — Z993 Dependence on wheelchair: Secondary | ICD-10-CM | POA: Diagnosis not present

## 2023-05-16 DIAGNOSIS — Z6841 Body Mass Index (BMI) 40.0 and over, adult: Secondary | ICD-10-CM | POA: Diagnosis not present

## 2023-05-16 DIAGNOSIS — I5032 Chronic diastolic (congestive) heart failure: Secondary | ICD-10-CM | POA: Diagnosis not present

## 2023-05-16 DIAGNOSIS — Z556 Problems related to health literacy: Secondary | ICD-10-CM | POA: Diagnosis not present

## 2023-05-16 DIAGNOSIS — F411 Generalized anxiety disorder: Secondary | ICD-10-CM | POA: Diagnosis not present

## 2023-05-16 DIAGNOSIS — I4891 Unspecified atrial fibrillation: Secondary | ICD-10-CM | POA: Diagnosis not present

## 2023-05-16 DIAGNOSIS — F319 Bipolar disorder, unspecified: Secondary | ICD-10-CM | POA: Diagnosis not present

## 2023-05-16 DIAGNOSIS — M170 Bilateral primary osteoarthritis of knee: Secondary | ICD-10-CM | POA: Diagnosis not present

## 2023-05-16 DIAGNOSIS — U071 COVID-19: Secondary | ICD-10-CM | POA: Diagnosis not present

## 2023-05-16 DIAGNOSIS — J9601 Acute respiratory failure with hypoxia: Secondary | ICD-10-CM | POA: Diagnosis not present

## 2023-05-16 DIAGNOSIS — Z7901 Long term (current) use of anticoagulants: Secondary | ICD-10-CM | POA: Diagnosis not present

## 2023-05-16 DIAGNOSIS — K219 Gastro-esophageal reflux disease without esophagitis: Secondary | ICD-10-CM | POA: Diagnosis not present

## 2023-05-16 NOTE — Transitions of Care (Post Inpatient/ED Visit) (Unsigned)
   05/16/2023  Name: Donna Sexton MRN: [id number] DOB: 12-03-1950  Today's TOC FU Call Status: Today's TOC FU Call Status:: Unsuccessful Call (2nd Attempt) Unsuccessful Call (1st Attempt) Date: 05/15/23 Unsuccessful Call (2nd Attempt) Date: 05/16/23  Attempted to reach the patient regarding the most recent Inpatient/ED visit.  Follow Up Plan: Additional outreach attempts will be made to reach the patient to complete the Transitions of Care (Post Inpatient/ED visit) call.   Signature Darrall Ellison, LPN [facility] Nurse Health Advisor Direct Dial [phone]

## 2023-05-17 ENCOUNTER — Ambulatory Visit: Attending: Emergency Medicine | Admitting: Emergency Medicine

## 2023-05-17 ENCOUNTER — Encounter: Payer: Self-pay | Admitting: Emergency Medicine

## 2023-05-17 VITALS — BP 96/64 | HR 96 | Ht 64.5 in | Wt 228.0 lb

## 2023-05-17 DIAGNOSIS — R002 Palpitations: Secondary | ICD-10-CM | POA: Diagnosis not present

## 2023-05-17 DIAGNOSIS — I480 Paroxysmal atrial fibrillation: Secondary | ICD-10-CM

## 2023-05-17 DIAGNOSIS — I10 Essential (primary) hypertension: Secondary | ICD-10-CM

## 2023-05-17 DIAGNOSIS — I5032 Chronic diastolic (congestive) heart failure: Secondary | ICD-10-CM

## 2023-05-17 MED ORDER — FUROSEMIDE 40 MG PO TABS: 40.0000 mg | ORAL_TABLET | Freq: Every day | ORAL | 2 refills | Status: DC

## 2023-05-17 NOTE — Progress Notes (Signed)
 Cardiology Office Note:    Date:  05/17/2023  ID:  Donna Sexton, DOB 02-28-1950, MRN [id number] PCP: Cydney Draft, MD   HeartCare Providers Cardiologist:  Peter Swaziland, MD       Patient Profile:      Chief Complaint: 8-week follow-up for diastolic heart failure History of Present Illness:  Donna Sexton is a 73 y.o. 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  She has a remote stress echocardiogram and Holter monitor.  Stress echo was negative for ischemia by EKG and ultrasound findings.  Of note noncompaction was not appreciated on her echo study.   She has been following with cardiology with [facility].  Echocardiogram in 2021 showed LVEF 55-60%, normal RV and no significant valvular disease.  Nuclear stress test 02/2020 showed no inducible ischemia or scar.  Heart monitor 11/2020 showed no significant arrhythmias and triggered events were associated with normal sinus rhythm, isolated rare PACs.   She was brought to [facility] ED via [facility] on 03/01/2023 after a questionable fall at home.  She spoke with family 2 days ago and was found down by a neighbor.  There is an unknown downtime.  She was confused and noted to be in A-fib with RVR with rates in the 160s.  CK was 905, lactic acid 3.0, she is positive for COVID-19.  She was diagnosed with acute hypoxemic respiratory failure, encephalopathy and sepsis secondary to COVID infection.  She was started on IV amiodarone  and IV Cardizem  as well as anticoagulated with IV heparin .  Her heart rates improved.  Echocardiogram 03/02/2023 showed LVEF 70-75%, RV function normal no valvular abnormalities.  She remained in persistent atrial  fibrillation with controlled ventricular rate at discharge.  She was discharged on amiodarone  400 mg twice daily, followed by 200 mg once daily, Eliquis  5 mg twice daily, diltiazem  360 mg once daily and Lasix  40 mg twice daily.  She was to follow-up in atrial fibrillation clinic in 2 weeks.   She was seen in the A-fib clinic on 03/21/2023.  She was in normal sinus rhythm at that time.  However she did have significant edema bilaterally.  Her diltiazem  was decreased to 180 mg daily and she was started on Lopressor  25 mg twice daily for rate control.  Her Lasix  was increased to 80 mg in the a.m. and 40 mg in the p.m.  She was to follow-up in A-fib clinic in 3 months for amiodarone  surveillance.  Patient was last seen in clinic on 04/05/2023 she is currently living at [facility].  She has noted that her lower extremity swelling is much improved.  She was without any heart failure symptoms and she is euvolemic and well compensated on exam.  She is currently back at her baseline leg swelling preadmission with 1+ lower extremity edema.  Her extra 40 mg of furosemide  was discontinued and she was continued on 80 mg furosemide  daily.   Discussed the use of AI scribe software for clinical note transcription with the patient, who gave verbal consent to proceed.  History of Present Illness Donna Sexton is a 73 year old female who presents to clinic today for 8-week follow-up for chronic diastolic heart failure.  She is  now back at home and she was recently living in a rehabilitation center.  She comes in the clinic today with her ex-husband.  She does live alone currently.  She is able to complete her ADLs at home without difficulty.  Today patient notes that she is doing well.  She reports her leg swelling has remained controlled and back to around her baseline.  She is without any shortness of breath, orthopnea, PND.  She has noted to have lost a total of 23 pounds over the  last 2 months.  She does note that she has experienced some intermittent episodes of lightheadedness and some fatigue over the last 2 days.  Her blood pressure today was 96/64.  She does not take her blood pressure at home.  She denies any symptoms concerning for recurrent atrial fibrillation.  She denies chest pain, shortness of breath, lower extremity edema diaphoresis, weakness, presyncope, syncope, orthopnea, and PND.  Review of systems:  Please see the history of present illness. All other systems are reviewed and otherwise negative.     Home Medications:    Current Meds  Medication Sig   acetaminophen  (TYLENOL ) 500 MG tablet Take 1 tablet (500 mg total) by mouth every 6 (six) hours as needed for mild pain (pain score 1-3) (or Fever >/= 101).   amiodarone  (PACERONE ) 200 MG tablet Take 1 tablet (200 mg total) by mouth daily.   apixaban  (ELIQUIS ) 5 MG TABS tablet Take 1 tablet (5 mg total) by mouth 2 (two) times daily.   atorvastatin  (LIPITOR) 40 MG tablet Take 1 tablet (40 mg total) by mouth daily.   diltiazem  (CARDIZEM  CD) 180 MG 24 hr capsule Take 1 capsule (180 mg total) by mouth daily.   divalproex  (DEPAKOTE  ER) 500 MG 24 hr tablet Take 3 tablets (1,500 mg total) by mouth at bedtime.   famotidine  (PEPCID ) 20 MG tablet Take 20 mg by mouth daily.   furosemide  (LASIX ) 40 MG tablet Take 1 tablet (40 mg total) by mouth daily.   levalbuterol  (XOPENEX ) 0.63 MG/3ML nebulizer solution Take 3 mLs (0.63 mg total) by nebulization every 6 (six) hours as needed for wheezing or shortness of breath.   melatonin 3 MG TABS tablet Take 1 tablet (3 mg total) by mouth at bedtime as needed.   metoprolol  tartrate (LOPRESSOR ) 25 MG tablet Take 1 tablet (25 mg total) by mouth 2 (two) times daily.   oxybutynin  (DITROPAN ) 5 MG tablet Take 1 tablet (5 mg total) by mouth 2 (two) times daily.   polyethylene glycol (MIRALAX  / GLYCOLAX ) 17 g packet Take 17 g by mouth daily as needed for moderate constipation.    pregabalin  (LYRICA ) 50 MG capsule Take 1 capsule (50 mg total) by mouth 3 (three) times daily.   senna-docusate (SENOKOT-S) 8.6-50 MG tablet Take 2 tablets by mouth at bedtime.   torsemide (DEMADEX) 100 MG tablet Take 50 mg by mouth daily.   [DISCONTINUED] furosemide  (LASIX ) 80 MG tablet TAKE 80 MG DAILY. MAY TAKE AN ADDITIONAL HALF TO WHOLE TABLET IF YOU GAIN 3 POUNDS OR MORE OVER NIGHT OR 5 POUNDS OR MORE IN ONE WEEK.   [DISCONTINUED] promethazine  (PHENERGAN ) 25 MG tablet TAKE ONE TABLET BY MOUTH EVERY 8 HOURS AS NEEDED FOR NAUSEA AND VOMITING   Studies Reviewed:       Echocardiogram 03/02/2023  1. Left ventricular ejection fraction, by estimation, is 70 to 75%. The  left ventricle has hyperdynamic function. Left ventricular endocardial  border not optimally defined to evaluate regional wall  motion. Left  ventricular diastolic parameters are  indeterminate.   2. Right ventricular systolic function is normal. The right ventricular  size is normal. Tricuspid regurgitation signal is inadequate for assessing  PA pressure.   3. The mitral valve was not well visualized. No evidence of mitral valve  regurgitation. No evidence of mitral stenosis.   4. The aortic valve was not well visualized. Aortic valve regurgitation  is not visualized. No aortic stenosis is present.   5. The inferior vena cava is normal in size with greater than 50%  respiratory variability, suggesting right atrial pressure of 3 mmHg.   Risk Assessment/Calculations:    CHA2DS2-VASc Score = 4   This indicates a 4.8% annual risk of stroke. The patient's score is based upon: CHF History: 1 HTN History: 1 Diabetes History: 0 Stroke History: 0 Vascular Disease History: 0 Age Score: 1 Gender Score: 1            Physical Exam:   VS:  BP 96/64   Pulse 96   Ht 5' 4.5" (1.638 m)   Wt 228 lb (103.4 kg)   SpO2 95%   BMI 38.53 kg/m    Wt Readings from Last 3 Encounters:  05/17/23 228 lb (103.4 kg)  04/14/23 242 lb  4.6 oz (109.9 kg)  04/12/23 250 lb (113.4 kg)    GEN: Well nourished, well developed in no acute distress NECK: No JVD; No carotid bruits CARDIAC: RRR, no murmurs, rubs, gallops RESPIRATORY:  Clear to auscultation without rales, wheezing or rhonchi  ABDOMEN: Soft, non-tender, non-distended EXTREMITIES: Nonpitting chronic lower extremity edema; No acute deformity     Assessment and Plan:  Chronic diastolic heart failure Echocardiogram 02/2023 LVEF 70 to 75% S/p exacerbation on 03/21/2023 Now down 23 total pounds since her initial exacerbation (251lbs 3/26 and 228lbs today)  - Today patient is overall euvolemic and well compensated on exam.  No SOB, orthopnea, PND and no signs of acute heart failure exacerbation. Chronic bilateral nonpitting pedal edema remains stable.  Her chronic edema is likely multifactorial due to venous insufficiency, suspected lymphedema, obesity, and sedentary lifestyle. - She does note to have some mild lightheadedness over the past 2 days.  Her blood pressure today is low normal at 96/61.  It is quite possible she is getting over diuresed and is volume depleted  - Today I will decrease her Lasix  from 80 mg to 40 mg daily. - She will slowly increase her fluid intake in order to improve her hydration status - Education given on lower extremity stockings, leg elevation, daily weights - BMET today  Atrial fibrillation Today she remains in NSR by auscultation No current complaints or symptoms suggestive of recurrent atrial fibrillation - Amiodarone  monitoring managed by atrial fibrillation clinic - Continue amiodarone  200 mg daily, diltiazem  180 mg daily, metoprolol  tartrate 25 mg twice daily - Continue Eliquis  5 mg twice daily, no bleeding concerns   CHA2DS2-VASc Score = 4 [CHF History: 1, HTN History: 1, Diabetes History: 0, Stroke History: 0, Vascular Disease History: 0, Age Score: 1, Gender Score: 1].  Therefore, the patient's annual risk of stroke is 4.8 %.        Hypertension Blood pressure today is 96/64 and under adequate control - She has noted some lightheadedness over the past 2 days.  Plan to decrease Lasix  from 80 mg to 40 mg daily as there is a possibility she has been getting over-diuresed and now volume depleted.  She will begin checking her blood pressure at  home and improve her hydration status.  She will have her blood pressure rechecked at primary care office next week.  If BP remains less than 100 systolic can consider moving her Lasix  from daily to as needed. - Continue metoprolol , diltiazem , furosemide       Dispo:  Return in about 3 months (around 08/17/2023).  Signed, Ava Boatman, NP

## 2023-05-17 NOTE — Patient Instructions (Signed)
 Medication Instructions:  DECREASE LASIX  TO 40 MG DAILY. CONTINUE WITH ALL OTHER MEDICATION THERAPY.   Lab Work: BMET TO BE DONE TODAY.   Testing/Procedures: NONE  Follow-Up: At [facility], you and your health needs are our priority.  As part of our continuing mission to provide you with exceptional heart care, our providers are all part of one team.  This team includes your primary Cardiologist (physician) and Advanced Practice Providers or APPs (Physician Assistants and Nurse Practitioners) who all work together to provide you with the care you need, when you need it.  Your next appointment:   3 MONTHS WITH DR. JORDAN.   Provider:   DR. Swaziland, MD

## 2023-05-18 ENCOUNTER — Other Ambulatory Visit: Payer: Self-pay | Admitting: Family Medicine

## 2023-05-18 ENCOUNTER — Encounter (HOSPITAL_COMMUNITY): Payer: Self-pay

## 2023-05-18 ENCOUNTER — Encounter: Payer: Self-pay | Admitting: Emergency Medicine

## 2023-05-18 DIAGNOSIS — I4891 Unspecified atrial fibrillation: Secondary | ICD-10-CM | POA: Diagnosis not present

## 2023-05-18 DIAGNOSIS — M170 Bilateral primary osteoarthritis of knee: Secondary | ICD-10-CM | POA: Diagnosis not present

## 2023-05-18 DIAGNOSIS — N3281 Overactive bladder: Secondary | ICD-10-CM | POA: Diagnosis not present

## 2023-05-18 DIAGNOSIS — I5032 Chronic diastolic (congestive) heart failure: Secondary | ICD-10-CM | POA: Diagnosis not present

## 2023-05-18 DIAGNOSIS — M5030 Other cervical disc degeneration, unspecified cervical region: Secondary | ICD-10-CM | POA: Diagnosis not present

## 2023-05-18 DIAGNOSIS — Z7901 Long term (current) use of anticoagulants: Secondary | ICD-10-CM | POA: Diagnosis not present

## 2023-05-18 DIAGNOSIS — Z556 Problems related to health literacy: Secondary | ICD-10-CM | POA: Diagnosis not present

## 2023-05-18 DIAGNOSIS — E7800 Pure hypercholesterolemia, unspecified: Secondary | ICD-10-CM | POA: Diagnosis not present

## 2023-05-18 DIAGNOSIS — E119 Type 2 diabetes mellitus without complications: Secondary | ICD-10-CM | POA: Diagnosis not present

## 2023-05-18 DIAGNOSIS — F411 Generalized anxiety disorder: Secondary | ICD-10-CM | POA: Diagnosis not present

## 2023-05-18 DIAGNOSIS — Z6841 Body Mass Index (BMI) 40.0 and over, adult: Secondary | ICD-10-CM | POA: Diagnosis not present

## 2023-05-18 DIAGNOSIS — U071 COVID-19: Secondary | ICD-10-CM | POA: Diagnosis not present

## 2023-05-18 DIAGNOSIS — G2581 Restless legs syndrome: Secondary | ICD-10-CM | POA: Diagnosis not present

## 2023-05-18 DIAGNOSIS — J9601 Acute respiratory failure with hypoxia: Secondary | ICD-10-CM | POA: Diagnosis not present

## 2023-05-18 DIAGNOSIS — D3161 Benign neoplasm of unspecified site of right orbit: Secondary | ICD-10-CM | POA: Diagnosis not present

## 2023-05-18 DIAGNOSIS — Z993 Dependence on wheelchair: Secondary | ICD-10-CM | POA: Diagnosis not present

## 2023-05-18 DIAGNOSIS — F319 Bipolar disorder, unspecified: Secondary | ICD-10-CM | POA: Diagnosis not present

## 2023-05-18 DIAGNOSIS — K219 Gastro-esophageal reflux disease without esophagitis: Secondary | ICD-10-CM | POA: Diagnosis not present

## 2023-05-18 DIAGNOSIS — N179 Acute kidney failure, unspecified: Secondary | ICD-10-CM | POA: Diagnosis not present

## 2023-05-18 DIAGNOSIS — Z9181 History of falling: Secondary | ICD-10-CM | POA: Diagnosis not present

## 2023-05-18 DIAGNOSIS — I110 Hypertensive heart disease with heart failure: Secondary | ICD-10-CM | POA: Diagnosis not present

## 2023-05-18 LAB — BASIC METABOLIC PANEL WITH GFR
BUN/Creatinine Ratio: 14 (ref 12–28)
BUN: 11 mg/dL (ref 8–27)
CO2: 21 mmol/L (ref 20–29)
Calcium: 9.3 mg/dL (ref 8.7–10.3)
Chloride: 100 mmol/L (ref 96–106)
Creatinine, Ser: 0.8 mg/dL (ref 0.57–1.00)
Glucose: 84 mg/dL (ref 70–99)
Potassium: 3.5 mmol/L (ref 3.5–5.2)
Sodium: 142 mmol/L (ref 134–144)
eGFR: 77 mL/min/{1.73_m2} (ref >59)

## 2023-05-18 NOTE — Transitions of Care (Post Inpatient/ED Visit) (Signed)
   05/18/2023  Name: Donna Sexton MRN: [id number] DOB: 12/24/1950  Today's TOC FU Call Status: Today's TOC FU Call Status:: Unsuccessful Call (3rd Attempt) Unsuccessful Call (1st Attempt) Date: 05/15/23 Unsuccessful Call (2nd Attempt) Date: 05/16/23 Unsuccessful Call (3rd Attempt) Date: 05/18/23  Attempted to reach the patient regarding the most recent Inpatient/ED visit.  Follow Up Plan: No further outreach attempts will be made at this time. We have been unable to contact the patient.  Signature Darrall Ellison, LPN [facility] Nurse Health Advisor Direct Dial [phone]

## 2023-05-19 ENCOUNTER — Ambulatory Visit: Payer: Self-pay

## 2023-05-19 NOTE — Telephone Encounter (Signed)
Please review the note below

## 2023-05-19 NOTE — Telephone Encounter (Signed)
 Copied from CRM [id number]. Topic: Clinical - Red Word Triage >> May 19, 2023 10:39 AM Brynn Caras wrote: Red Word that prompted transfer to Nurse Triage: The patient is on a walker, she reached to far and fell down on her knees resulting in extreme pain. She is requesting pain medication to be sent to the pharmacy.   Chief Complaint: Bilateral Knee Pain Symptoms: pain Frequency: yesterday evening about 9pm Pertinent Negatives: Patient denies bruising, swelling, bleeding, passing out, hitting her head,  Disposition: [] ED /[] Urgent Care (no appt availability in office) / [] Appointment(In office/virtual)/ []  [facility]/ [] Home Care/ [x] Refused Recommended Disposition /[] [facility]/ []  Follow-up with PCP Additional Notes: Patient called and advised that she reached too far away from her walker.  She states that she landed on her knees. She states that her knees are hurting. Patient states her pain is a 9 out of 10.  Patient had a physical therapist who came yesterday and he advised her to use ice packs on her knees.  Patient has been doing that.  Patient denies any bruising, swelling, bleeding, passing out, hitting her head.  Patient is advised that the recommendation is that she is seen and evaluated by a provider in the next 4 hours.  Patient states she doesn't have transportation to be seen.  She is also advised that Urgent Cares are an alternative to get seen and evaluated.  Patient initially wanted to see if pain medication could be sent to her Pharmacy prior to Triage.  This RN advised her that I would send this message along to her PCP but I recommend her getting checked out as soon as she can. Patient advised that she has an upcoming appointment with Dr Greer Leak on the 15th. She was advised that that appointment was six days away and it is recommended that she get her knees checked out sooner than that.  Patient is also advised that if anything gets worse to go to the  Emergency Room. She is advised that she can call 911 for an ambulance for transportation to the Emergency Room if needed. Patient verbalized understanding.   Reason for Disposition  [1] SEVERE pain AND [2] not improved 2 hours after pain medicine/ice packs  Answer Assessment - Initial Assessment Questions 1. MECHANISM: "How did the injury happen?" (e.g., twisting injury, direct blow)      Leaning over and fell to her knees 2. ONSET: "When did the injury happen?" (Minutes or hours ago)      Yesterday evening at 9pm 3. LOCATION: "Where is the injury located?"      Both knees 4. APPEARANCE of INJURY: "What does the injury look like?"      ---- 5. SEVERITY: "Can you put weight on that leg?" "Can you walk?"      Yes but painful 6. SIZE: For cuts, bruises, or swelling, ask: "How large is it?" (e.g., inches or centimeters;  entire joint)      Patient denies 7. PAIN: "Is there pain?" If Yes, ask: "How bad is the pain?"  "What does it keep you from doing?" (e.g., Scale 1-10; or mild, moderate, severe)   -  NONE: (0): no pain   -  MILD (1-3): doesn't interfere with normal activities    -  MODERATE (4-7): interferes with normal activities (e.g., work or school) or awakens from sleep, limping    -  SEVERE (8-10): excruciating pain, unable to do any normal activities, unable to walk     9  8. TETANUS: For any breaks in the skin, ask: "When was the last tetanus booster?"     ---- 9. OTHER SYMPTOMS: "Do you have any other symptoms?"  (e.g., "pop" when knee injured, swelling, locking, buckling)      ----  Answer Assessment - Initial Assessment Questions 1. LOCATION and RADIATION: "Where is the pain located?"      Both knees 2. QUALITY: "What does the pain feel like?"  (e.g., sharp, dull, aching, burning)     ---- 3. SEVERITY: "How bad is the pain?" "What does it keep you from doing?"   (Scale 1-10; or mild, moderate, severe)   -  MILD (1-3): doesn't interfere with normal activities    -   MODERATE (4-7): interferes with normal activities (e.g., work or school) or awakens from sleep, limping    -  SEVERE (8-10): excruciating pain, unable to do any normal activities, unable to walk     9 4. ONSET: "When did the pain start?" "Does it come and go, or is it there all the time?"     Yesterday evening about 9pm 5. RECURRENT: "Have you had this pain before?" If Yes, ask: "When, and what happened then?"     ---- 6. SETTING: "Has there been any recent work, exercise or other activity that involved that part of the body?"      Daily use 7. AGGRAVATING FACTORS: "What makes the knee pain worse?" (e.g., walking, climbing stairs, running)     Walking 8. ASSOCIATED SYMPTOMS: "Is there any swelling or redness of the knee?"     denies 9. OTHER SYMPTOMS: "Do you have any other symptoms?" (e.g., chest pain, difficulty breathing, fever, calf pain)     denies  Protocols used: Knee Pain-A-AH, Knee Injury-A-AH

## 2023-05-22 DIAGNOSIS — Z7901 Long term (current) use of anticoagulants: Secondary | ICD-10-CM | POA: Diagnosis not present

## 2023-05-22 DIAGNOSIS — M5030 Other cervical disc degeneration, unspecified cervical region: Secondary | ICD-10-CM | POA: Diagnosis not present

## 2023-05-22 DIAGNOSIS — Z9181 History of falling: Secondary | ICD-10-CM | POA: Diagnosis not present

## 2023-05-22 DIAGNOSIS — Z556 Problems related to health literacy: Secondary | ICD-10-CM | POA: Diagnosis not present

## 2023-05-22 DIAGNOSIS — E119 Type 2 diabetes mellitus without complications: Secondary | ICD-10-CM | POA: Diagnosis not present

## 2023-05-22 DIAGNOSIS — N179 Acute kidney failure, unspecified: Secondary | ICD-10-CM | POA: Diagnosis not present

## 2023-05-22 DIAGNOSIS — J9601 Acute respiratory failure with hypoxia: Secondary | ICD-10-CM | POA: Diagnosis not present

## 2023-05-22 DIAGNOSIS — Z6841 Body Mass Index (BMI) 40.0 and over, adult: Secondary | ICD-10-CM | POA: Diagnosis not present

## 2023-05-22 DIAGNOSIS — F319 Bipolar disorder, unspecified: Secondary | ICD-10-CM | POA: Diagnosis not present

## 2023-05-22 DIAGNOSIS — I5032 Chronic diastolic (congestive) heart failure: Secondary | ICD-10-CM | POA: Diagnosis not present

## 2023-05-22 DIAGNOSIS — Z993 Dependence on wheelchair: Secondary | ICD-10-CM | POA: Diagnosis not present

## 2023-05-22 DIAGNOSIS — I110 Hypertensive heart disease with heart failure: Secondary | ICD-10-CM | POA: Diagnosis not present

## 2023-05-22 DIAGNOSIS — F411 Generalized anxiety disorder: Secondary | ICD-10-CM | POA: Diagnosis not present

## 2023-05-22 DIAGNOSIS — U071 COVID-19: Secondary | ICD-10-CM | POA: Diagnosis not present

## 2023-05-22 DIAGNOSIS — G2581 Restless legs syndrome: Secondary | ICD-10-CM | POA: Diagnosis not present

## 2023-05-22 DIAGNOSIS — D3161 Benign neoplasm of unspecified site of right orbit: Secondary | ICD-10-CM | POA: Diagnosis not present

## 2023-05-22 DIAGNOSIS — M170 Bilateral primary osteoarthritis of knee: Secondary | ICD-10-CM | POA: Diagnosis not present

## 2023-05-22 DIAGNOSIS — N3281 Overactive bladder: Secondary | ICD-10-CM | POA: Diagnosis not present

## 2023-05-22 DIAGNOSIS — E7800 Pure hypercholesterolemia, unspecified: Secondary | ICD-10-CM | POA: Diagnosis not present

## 2023-05-22 DIAGNOSIS — I4891 Unspecified atrial fibrillation: Secondary | ICD-10-CM | POA: Diagnosis not present

## 2023-05-22 DIAGNOSIS — K219 Gastro-esophageal reflux disease without esophagitis: Secondary | ICD-10-CM | POA: Diagnosis not present

## 2023-05-23 ENCOUNTER — Other Ambulatory Visit: Payer: Self-pay | Admitting: Family Medicine

## 2023-05-23 ENCOUNTER — Ambulatory Visit: Payer: Self-pay | Admitting: Emergency Medicine

## 2023-05-23 DIAGNOSIS — N3281 Overactive bladder: Secondary | ICD-10-CM

## 2023-05-24 DIAGNOSIS — E7800 Pure hypercholesterolemia, unspecified: Secondary | ICD-10-CM | POA: Diagnosis not present

## 2023-05-24 DIAGNOSIS — Z556 Problems related to health literacy: Secondary | ICD-10-CM | POA: Diagnosis not present

## 2023-05-24 DIAGNOSIS — G2581 Restless legs syndrome: Secondary | ICD-10-CM | POA: Diagnosis not present

## 2023-05-24 DIAGNOSIS — I5032 Chronic diastolic (congestive) heart failure: Secondary | ICD-10-CM | POA: Diagnosis not present

## 2023-05-24 DIAGNOSIS — I110 Hypertensive heart disease with heart failure: Secondary | ICD-10-CM | POA: Diagnosis not present

## 2023-05-24 DIAGNOSIS — D3161 Benign neoplasm of unspecified site of right orbit: Secondary | ICD-10-CM | POA: Diagnosis not present

## 2023-05-24 DIAGNOSIS — Z7901 Long term (current) use of anticoagulants: Secondary | ICD-10-CM | POA: Diagnosis not present

## 2023-05-24 DIAGNOSIS — F411 Generalized anxiety disorder: Secondary | ICD-10-CM | POA: Diagnosis not present

## 2023-05-24 DIAGNOSIS — I4891 Unspecified atrial fibrillation: Secondary | ICD-10-CM | POA: Diagnosis not present

## 2023-05-24 DIAGNOSIS — N179 Acute kidney failure, unspecified: Secondary | ICD-10-CM | POA: Diagnosis not present

## 2023-05-24 DIAGNOSIS — E119 Type 2 diabetes mellitus without complications: Secondary | ICD-10-CM | POA: Diagnosis not present

## 2023-05-24 DIAGNOSIS — Z9181 History of falling: Secondary | ICD-10-CM | POA: Diagnosis not present

## 2023-05-24 DIAGNOSIS — N3281 Overactive bladder: Secondary | ICD-10-CM | POA: Diagnosis not present

## 2023-05-24 DIAGNOSIS — M170 Bilateral primary osteoarthritis of knee: Secondary | ICD-10-CM | POA: Diagnosis not present

## 2023-05-24 DIAGNOSIS — J9601 Acute respiratory failure with hypoxia: Secondary | ICD-10-CM | POA: Diagnosis not present

## 2023-05-24 DIAGNOSIS — U071 COVID-19: Secondary | ICD-10-CM | POA: Diagnosis not present

## 2023-05-24 DIAGNOSIS — Z993 Dependence on wheelchair: Secondary | ICD-10-CM | POA: Diagnosis not present

## 2023-05-24 DIAGNOSIS — F319 Bipolar disorder, unspecified: Secondary | ICD-10-CM | POA: Diagnosis not present

## 2023-05-24 DIAGNOSIS — Z6841 Body Mass Index (BMI) 40.0 and over, adult: Secondary | ICD-10-CM | POA: Diagnosis not present

## 2023-05-24 DIAGNOSIS — K219 Gastro-esophageal reflux disease without esophagitis: Secondary | ICD-10-CM | POA: Diagnosis not present

## 2023-05-24 DIAGNOSIS — M5030 Other cervical disc degeneration, unspecified cervical region: Secondary | ICD-10-CM | POA: Diagnosis not present

## 2023-05-25 ENCOUNTER — Ambulatory Visit (INDEPENDENT_AMBULATORY_CARE_PROVIDER_SITE_OTHER): Admitting: Family Medicine

## 2023-05-25 ENCOUNTER — Encounter: Payer: Self-pay | Admitting: Family Medicine

## 2023-05-25 VITALS — BP 108/66 | HR 70 | Ht 64.5 in | Wt 248.0 lb

## 2023-05-25 DIAGNOSIS — I4891 Unspecified atrial fibrillation: Secondary | ICD-10-CM | POA: Diagnosis not present

## 2023-05-25 DIAGNOSIS — I110 Hypertensive heart disease with heart failure: Secondary | ICD-10-CM | POA: Diagnosis not present

## 2023-05-25 DIAGNOSIS — D3161 Benign neoplasm of unspecified site of right orbit: Secondary | ICD-10-CM | POA: Diagnosis not present

## 2023-05-25 DIAGNOSIS — U071 COVID-19: Secondary | ICD-10-CM | POA: Diagnosis not present

## 2023-05-25 DIAGNOSIS — E7800 Pure hypercholesterolemia, unspecified: Secondary | ICD-10-CM | POA: Diagnosis not present

## 2023-05-25 DIAGNOSIS — Z9181 History of falling: Secondary | ICD-10-CM | POA: Diagnosis not present

## 2023-05-25 DIAGNOSIS — F411 Generalized anxiety disorder: Secondary | ICD-10-CM | POA: Diagnosis not present

## 2023-05-25 DIAGNOSIS — I5032 Chronic diastolic (congestive) heart failure: Secondary | ICD-10-CM

## 2023-05-25 DIAGNOSIS — N179 Acute kidney failure, unspecified: Secondary | ICD-10-CM | POA: Diagnosis not present

## 2023-05-25 DIAGNOSIS — N3281 Overactive bladder: Secondary | ICD-10-CM | POA: Diagnosis not present

## 2023-05-25 DIAGNOSIS — R300 Dysuria: Secondary | ICD-10-CM

## 2023-05-25 DIAGNOSIS — Z556 Problems related to health literacy: Secondary | ICD-10-CM | POA: Diagnosis not present

## 2023-05-25 DIAGNOSIS — R410 Disorientation, unspecified: Secondary | ICD-10-CM

## 2023-05-25 DIAGNOSIS — J9601 Acute respiratory failure with hypoxia: Secondary | ICD-10-CM | POA: Diagnosis not present

## 2023-05-25 DIAGNOSIS — M5030 Other cervical disc degeneration, unspecified cervical region: Secondary | ICD-10-CM | POA: Diagnosis not present

## 2023-05-25 DIAGNOSIS — Z993 Dependence on wheelchair: Secondary | ICD-10-CM | POA: Diagnosis not present

## 2023-05-25 DIAGNOSIS — E119 Type 2 diabetes mellitus without complications: Secondary | ICD-10-CM | POA: Diagnosis not present

## 2023-05-25 DIAGNOSIS — M170 Bilateral primary osteoarthritis of knee: Secondary | ICD-10-CM | POA: Diagnosis not present

## 2023-05-25 DIAGNOSIS — K219 Gastro-esophageal reflux disease without esophagitis: Secondary | ICD-10-CM | POA: Diagnosis not present

## 2023-05-25 DIAGNOSIS — G2581 Restless legs syndrome: Secondary | ICD-10-CM | POA: Diagnosis not present

## 2023-05-25 DIAGNOSIS — I10 Essential (primary) hypertension: Secondary | ICD-10-CM

## 2023-05-25 DIAGNOSIS — Z6841 Body Mass Index (BMI) 40.0 and over, adult: Secondary | ICD-10-CM | POA: Diagnosis not present

## 2023-05-25 DIAGNOSIS — F319 Bipolar disorder, unspecified: Secondary | ICD-10-CM | POA: Diagnosis not present

## 2023-05-25 DIAGNOSIS — Z7901 Long term (current) use of anticoagulants: Secondary | ICD-10-CM | POA: Diagnosis not present

## 2023-05-25 LAB — POCT URINALYSIS DIP (CLINITEK)
Bilirubin, UA: NEGATIVE
Blood, UA: NEGATIVE
Glucose, UA: NEGATIVE mg/dL
Ketones, POC UA: NEGATIVE mg/dL
Nitrite, UA: NEGATIVE
POC PROTEIN,UA: NEGATIVE
Spec Grav, UA: 1.015 (ref 1.010–1.025)
Urobilinogen, UA: 2 U/dL — AB
pH, UA: 7 (ref 5.0–8.0)

## 2023-05-25 MED ORDER — NITROFURANTOIN MONOHYD MACRO 100 MG PO CAPS
100.0000 mg | ORAL_CAPSULE | Freq: Two times a day (BID) | ORAL | 0 refills | Status: AC
Start: 2023-05-25 — End: ?

## 2023-05-25 NOTE — Progress Notes (Signed)
 Established Patient Office Visit  Subjective  Patient ID: Donna Sexton, female    DOB: June 28, 1950  Age: 73 y.o. MRN: [id number]  Chief Complaint  Patient presents with   Hospitalization Follow-up    HPI  Here today for hospital follow-up.  She was admitted to the hospital in April 3 and discharged home on April 10 and then discharged home to a skilled nursing facility.  She had recently been hospitalized in February for sepsis secondary to COVID encephalopathy A-fib with RVR and then eventually discharged to rehab on amiodarone  and Eliquis .  She had been at rehab for about 4 weeks at that point.  At that time she was able to ambulate however she started to decline when she got home again and had not been getting out of bed.  She was diagnosed with acute E. coli UTI causing metabolic encephalopathy.  Urine culture grew out E. coli and she was treated with 5 days of IV Rocephin .  While there they did follow-up on a possible orbital mass that was seen on a CT back in February they did an MRI of the orbits for further workup on April 4 and it was negative for any sign of mass.  Note she did fall today while trying to transfer off the potty chair and they had to call EMS to help her up.  Her ex-husband who is here with her today says in the last couple days she seems to been getting a little bit weaker and they noticed that her urine looked cloudy again. She seem more confused as well.       ROS    Objective:      BP 108/66   Pulse 70   Ht 5' 4.5" (1.638 m)   Wt 248 lb 0.6 oz (112.5 kg)   SpO2 94%   BMI 41.92 kg/m    Physical Exam Vitals and nursing note reviewed.  Constitutional:      Appearance: Normal appearance.  HENT:     Head: Normocephalic and atraumatic.  Eyes:     Conjunctiva/sclera: Conjunctivae normal.  Cardiovascular:     Rate and Rhythm: Normal rate and regular rhythm.  Pulmonary:     Effort: Pulmonary effort is normal.     Breath sounds: Normal breath  sounds.  Musculoskeletal:     Comments: 2+ pitting edema in both feet and lower extremities.  Skin:    General: Skin is warm and dry.  Neurological:     Mental Status: She is alert.  Psychiatric:        Mood and Affect: Mood normal.      Results for orders placed or performed in visit on 05/25/23  POCT URINALYSIS DIP (CLINITEK)  Result Value Ref Range   Color, UA straw (A) yellow   Clarity, UA cloudy (A) clear   Glucose, UA negative negative mg/dL   Bilirubin, UA negative negative   Ketones, POC UA negative negative mg/dL   Spec Grav, UA 4.098 1.191 - 1.025   Blood, UA negative negative   pH, UA 7.0 5.0 - 8.0   POC PROTEIN,UA negative negative, trace   Urobilinogen, UA 2.0 (A) 0.2 or 1.0 E.U./dL   Nitrite, UA Negative Negative   Leukocytes, UA Trace (A) Negative      The 10-year ASCVD risk score (Arnett DK, et al., 2019) is: 13.4%    Assessment & Plan:   Problem List Items Addressed This Visit       Cardiovascular and Mediastinum  Essential hypertension (Chronic)   Blood pressure on the lower end of normal.  Continue to monitor.  Commend compression stockings.      Chronic heart failure with preserved ejection fraction (HCC)   Is clearly volume overloaded today.  In fact her weight was up 20 pounds from about 2 weeks ago when she went to the cardiologist office.  I am going to have her limit her fluids to no more than 50 ounces total per day and have her double up on her furosemide  for the next couple of days.  Needs to weight herself daily .  Her blood pressure is a little on the lower end but I did encourage her to also get someone to help her put her compression stockings on.  That should also help with the lower extremity edema and help keep her blood pressure elevated especially if she does increase her furosemide .      Relevant Orders   CBC with Differential/Platelet   CMP14+EGFR   TSH   B Nat Peptide   Atrial fibrillation with rapid ventricular response  (HCC)   Oertli on Eliquis  and amiodarone .  And diltiazem .      Other Visit Diagnoses       Confusion    -  Primary   Relevant Orders   CBC with Differential/Platelet   CMP14+EGFR   TSH   B Nat Peptide     Dysuria       Relevant Orders   Urine Culture   POCT URINALYSIS DIP (CLINITEK) (Completed)   CBC with Differential/Platelet   CMP14+EGFR   TSH      Confusion and dysuria in the last couple of days dipstick urinalysis overall actually looks okay but am going to go ahead and cover for UTI based on her symptoms and we will send for culture for confirmation.    Return in about 6 weeks (around 07/06/2023).    Duaine German, MD

## 2023-05-25 NOTE — Assessment & Plan Note (Signed)
 Oertli on Eliquis  and amiodarone .  And diltiazem .

## 2023-05-25 NOTE — Assessment & Plan Note (Addendum)
 Is clearly volume overloaded today.  In fact her weight was up 20 pounds from about 2 weeks ago when she went to the cardiologist office.  I am going to have her limit her fluids to no more than 50 ounces total per day and have her double up on her furosemide  for the next couple of days.  Needs to weight herself daily .  Her blood pressure is a little on the lower end but I did encourage her to also get someone to help her put her compression stockings on.  That should also help with the lower extremity edema and help keep her blood pressure elevated especially if she does increase her furosemide .

## 2023-05-25 NOTE — Assessment & Plan Note (Signed)
 Blood pressure on the lower end of normal.  Continue to monitor.  Commend compression stockings.

## 2023-05-26 LAB — CBC WITH DIFFERENTIAL/PLATELET
Basophils Absolute: 0.1 10*3/uL (ref 0.0–0.2)
Basos: 1 %
EOS (ABSOLUTE): 0.2 10*3/uL (ref 0.0–0.4)
Eos: 2 %
Hematocrit: 36.6 % (ref 34.0–46.6)
Hemoglobin: 12.2 g/dL (ref 11.1–15.9)
Immature Grans (Abs): 0.2 10*3/uL — ABNORMAL HIGH (ref 0.0–0.1)
Immature Granulocytes: 3 %
Lymphocytes Absolute: 1.4 10*3/uL (ref 0.7–3.1)
Lymphs: 18 %
MCH: 34.2 pg — ABNORMAL HIGH (ref 26.6–33.0)
MCHC: 33.3 g/dL (ref 31.5–35.7)
MCV: 103 fL — ABNORMAL HIGH (ref 79–97)
Monocytes Absolute: 1.3 10*3/uL — ABNORMAL HIGH (ref 0.1–0.9)
Monocytes: 17 %
Neutrophils Absolute: 4.4 10*3/uL (ref 1.4–7.0)
Neutrophils: 59 %
Platelets: 160 10*3/uL (ref 150–450)
RBC: 3.57 x10E6/uL — ABNORMAL LOW (ref 3.77–5.28)
RDW: 12.2 % (ref 11.7–15.4)
WBC: 7.4 10*3/uL (ref 3.4–10.8)

## 2023-05-26 LAB — CMP14+EGFR
ALT: 8 IU/L (ref 0–32)
AST: 13 IU/L (ref 0–40)
Albumin: 3.8 g/dL (ref 3.8–4.8)
Alkaline Phosphatase: 66 IU/L (ref 44–121)
BUN/Creatinine Ratio: 11 — ABNORMAL LOW (ref 12–28)
BUN: 10 mg/dL (ref 8–27)
Bilirubin Total: 0.9 mg/dL (ref 0.0–1.2)
CO2: 25 mmol/L (ref 20–29)
Calcium: 9.4 mg/dL (ref 8.7–10.3)
Chloride: 95 mmol/L — ABNORMAL LOW (ref 96–106)
Creatinine, Ser: 0.89 mg/dL (ref 0.57–1.00)
Globulin, Total: 1.6 g/dL (ref 1.5–4.5)
Glucose: 89 mg/dL (ref 70–99)
Potassium: 3.8 mmol/L (ref 3.5–5.2)
Sodium: 138 mmol/L (ref 134–144)
Total Protein: 5.4 g/dL — ABNORMAL LOW (ref 6.0–8.5)
eGFR: 68 mL/min/{1.73_m2} (ref >59)

## 2023-05-26 LAB — BRAIN NATRIURETIC PEPTIDE: BNP: 84.9 pg/mL (ref 0.0–100.0)

## 2023-05-26 LAB — TSH: TSH: 4.09 u[IU]/mL (ref 0.450–4.500)

## 2023-05-29 ENCOUNTER — Ambulatory Visit: Payer: Self-pay | Admitting: Family Medicine

## 2023-05-29 DIAGNOSIS — R300 Dysuria: Secondary | ICD-10-CM

## 2023-05-29 NOTE — Progress Notes (Signed)
 Hi Daysi, your hemoglobin is stable.  No sign of systemic infection which is reassuring.  Kidney function is stable.  No sign of acute exacerbation with heart failure.  Thyroid  level is normal.

## 2023-05-30 LAB — URINE CULTURE

## 2023-05-30 MED ORDER — SULFAMETHOXAZOLE-TRIMETHOPRIM 800-160 MG PO TABS: 1.0000 | ORAL_TABLET | Freq: Two times a day (BID) | ORAL | 0 refills | Status: DC

## 2023-05-30 NOTE — Progress Notes (Signed)
 Note below.  Patient has also been a little bit confused so we may need to call her son Jerrilyn Moras and let him know

## 2023-05-30 NOTE — Progress Notes (Signed)
 Donna Sexton, the urine culture came back positive for Klebsiella.  May not respond great to the nitrofurantoin  that I put you on.  It is considered intermediate so I am going to put you on a new antibiotic called Bactrim.  I will send that over to your pharmacy today.

## 2023-06-01 DIAGNOSIS — N3281 Overactive bladder: Secondary | ICD-10-CM | POA: Diagnosis not present

## 2023-06-01 DIAGNOSIS — F319 Bipolar disorder, unspecified: Secondary | ICD-10-CM | POA: Diagnosis not present

## 2023-06-01 DIAGNOSIS — M170 Bilateral primary osteoarthritis of knee: Secondary | ICD-10-CM | POA: Diagnosis not present

## 2023-06-01 DIAGNOSIS — F411 Generalized anxiety disorder: Secondary | ICD-10-CM | POA: Diagnosis not present

## 2023-06-01 DIAGNOSIS — M5030 Other cervical disc degeneration, unspecified cervical region: Secondary | ICD-10-CM | POA: Diagnosis not present

## 2023-06-01 DIAGNOSIS — E7800 Pure hypercholesterolemia, unspecified: Secondary | ICD-10-CM | POA: Diagnosis not present

## 2023-06-01 DIAGNOSIS — G2581 Restless legs syndrome: Secondary | ICD-10-CM | POA: Diagnosis not present

## 2023-06-01 DIAGNOSIS — D3161 Benign neoplasm of unspecified site of right orbit: Secondary | ICD-10-CM | POA: Diagnosis not present

## 2023-06-01 DIAGNOSIS — J9601 Acute respiratory failure with hypoxia: Secondary | ICD-10-CM | POA: Diagnosis not present

## 2023-06-01 DIAGNOSIS — Z6841 Body Mass Index (BMI) 40.0 and over, adult: Secondary | ICD-10-CM | POA: Diagnosis not present

## 2023-06-01 DIAGNOSIS — K219 Gastro-esophageal reflux disease without esophagitis: Secondary | ICD-10-CM | POA: Diagnosis not present

## 2023-06-01 DIAGNOSIS — U071 COVID-19: Secondary | ICD-10-CM | POA: Diagnosis not present

## 2023-06-01 DIAGNOSIS — I5032 Chronic diastolic (congestive) heart failure: Secondary | ICD-10-CM | POA: Diagnosis not present

## 2023-06-01 DIAGNOSIS — I4891 Unspecified atrial fibrillation: Secondary | ICD-10-CM | POA: Diagnosis not present

## 2023-06-01 DIAGNOSIS — N179 Acute kidney failure, unspecified: Secondary | ICD-10-CM | POA: Diagnosis not present

## 2023-06-01 DIAGNOSIS — Z556 Problems related to health literacy: Secondary | ICD-10-CM | POA: Diagnosis not present

## 2023-06-01 DIAGNOSIS — Z7901 Long term (current) use of anticoagulants: Secondary | ICD-10-CM | POA: Diagnosis not present

## 2023-06-01 DIAGNOSIS — E119 Type 2 diabetes mellitus without complications: Secondary | ICD-10-CM | POA: Diagnosis not present

## 2023-06-01 DIAGNOSIS — Z993 Dependence on wheelchair: Secondary | ICD-10-CM | POA: Diagnosis not present

## 2023-06-01 DIAGNOSIS — Z9181 History of falling: Secondary | ICD-10-CM | POA: Diagnosis not present

## 2023-06-01 DIAGNOSIS — I110 Hypertensive heart disease with heart failure: Secondary | ICD-10-CM | POA: Diagnosis not present

## 2023-06-02 ENCOUNTER — Telehealth: Payer: Self-pay

## 2023-06-02 DIAGNOSIS — Z7901 Long term (current) use of anticoagulants: Secondary | ICD-10-CM | POA: Diagnosis not present

## 2023-06-02 DIAGNOSIS — U071 COVID-19: Secondary | ICD-10-CM | POA: Diagnosis not present

## 2023-06-02 DIAGNOSIS — J9601 Acute respiratory failure with hypoxia: Secondary | ICD-10-CM | POA: Diagnosis not present

## 2023-06-02 DIAGNOSIS — Z993 Dependence on wheelchair: Secondary | ICD-10-CM | POA: Diagnosis not present

## 2023-06-02 DIAGNOSIS — F411 Generalized anxiety disorder: Secondary | ICD-10-CM | POA: Diagnosis not present

## 2023-06-02 DIAGNOSIS — I5032 Chronic diastolic (congestive) heart failure: Secondary | ICD-10-CM | POA: Diagnosis not present

## 2023-06-02 DIAGNOSIS — M5030 Other cervical disc degeneration, unspecified cervical region: Secondary | ICD-10-CM | POA: Diagnosis not present

## 2023-06-02 DIAGNOSIS — I4891 Unspecified atrial fibrillation: Secondary | ICD-10-CM | POA: Diagnosis not present

## 2023-06-02 DIAGNOSIS — E7800 Pure hypercholesterolemia, unspecified: Secondary | ICD-10-CM | POA: Diagnosis not present

## 2023-06-02 DIAGNOSIS — Z6841 Body Mass Index (BMI) 40.0 and over, adult: Secondary | ICD-10-CM | POA: Diagnosis not present

## 2023-06-02 DIAGNOSIS — Z556 Problems related to health literacy: Secondary | ICD-10-CM | POA: Diagnosis not present

## 2023-06-02 DIAGNOSIS — Z9181 History of falling: Secondary | ICD-10-CM | POA: Diagnosis not present

## 2023-06-02 DIAGNOSIS — N179 Acute kidney failure, unspecified: Secondary | ICD-10-CM | POA: Diagnosis not present

## 2023-06-02 DIAGNOSIS — F319 Bipolar disorder, unspecified: Secondary | ICD-10-CM | POA: Diagnosis not present

## 2023-06-02 DIAGNOSIS — K219 Gastro-esophageal reflux disease without esophagitis: Secondary | ICD-10-CM | POA: Diagnosis not present

## 2023-06-02 DIAGNOSIS — N3281 Overactive bladder: Secondary | ICD-10-CM | POA: Diagnosis not present

## 2023-06-02 DIAGNOSIS — G2581 Restless legs syndrome: Secondary | ICD-10-CM | POA: Diagnosis not present

## 2023-06-02 DIAGNOSIS — M170 Bilateral primary osteoarthritis of knee: Secondary | ICD-10-CM | POA: Diagnosis not present

## 2023-06-02 DIAGNOSIS — I110 Hypertensive heart disease with heart failure: Secondary | ICD-10-CM | POA: Diagnosis not present

## 2023-06-02 DIAGNOSIS — E119 Type 2 diabetes mellitus without complications: Secondary | ICD-10-CM | POA: Diagnosis not present

## 2023-06-02 DIAGNOSIS — D3161 Benign neoplasm of unspecified site of right orbit: Secondary | ICD-10-CM | POA: Diagnosis not present

## 2023-06-02 NOTE — Telephone Encounter (Signed)
 Copied from CRM [id number]. Topic: Clinical - Home Health Verbal Orders >> Jun 02, 2023  4:20 PM Kevelyn M wrote: Caller/Agency: [facility] Callback Number: [phone] Service Requested: Physical Therapy Frequency: 1 time a week/9 weeks Any new concerns about the patient? No

## 2023-06-06 DIAGNOSIS — Z604 Social exclusion and rejection: Secondary | ICD-10-CM | POA: Diagnosis not present

## 2023-06-06 DIAGNOSIS — I110 Hypertensive heart disease with heart failure: Secondary | ICD-10-CM | POA: Diagnosis not present

## 2023-06-06 DIAGNOSIS — D6869 Other thrombophilia: Secondary | ICD-10-CM | POA: Diagnosis not present

## 2023-06-06 DIAGNOSIS — N3281 Overactive bladder: Secondary | ICD-10-CM | POA: Diagnosis not present

## 2023-06-06 DIAGNOSIS — D3161 Benign neoplasm of unspecified site of right orbit: Secondary | ICD-10-CM | POA: Diagnosis not present

## 2023-06-06 DIAGNOSIS — Z7951 Long term (current) use of inhaled steroids: Secondary | ICD-10-CM | POA: Diagnosis not present

## 2023-06-06 DIAGNOSIS — Z556 Problems related to health literacy: Secondary | ICD-10-CM | POA: Diagnosis not present

## 2023-06-06 DIAGNOSIS — J4520 Mild intermittent asthma, uncomplicated: Secondary | ICD-10-CM | POA: Diagnosis not present

## 2023-06-06 DIAGNOSIS — M170 Bilateral primary osteoarthritis of knee: Secondary | ICD-10-CM | POA: Diagnosis not present

## 2023-06-06 DIAGNOSIS — M5030 Other cervical disc degeneration, unspecified cervical region: Secondary | ICD-10-CM | POA: Diagnosis not present

## 2023-06-06 DIAGNOSIS — F411 Generalized anxiety disorder: Secondary | ICD-10-CM | POA: Diagnosis not present

## 2023-06-06 DIAGNOSIS — Z8616 Personal history of COVID-19: Secondary | ICD-10-CM | POA: Diagnosis not present

## 2023-06-06 DIAGNOSIS — K219 Gastro-esophageal reflux disease without esophagitis: Secondary | ICD-10-CM | POA: Diagnosis not present

## 2023-06-06 DIAGNOSIS — F319 Bipolar disorder, unspecified: Secondary | ICD-10-CM | POA: Diagnosis not present

## 2023-06-06 DIAGNOSIS — G43009 Migraine without aura, not intractable, without status migrainosus: Secondary | ICD-10-CM | POA: Diagnosis not present

## 2023-06-06 DIAGNOSIS — G2581 Restless legs syndrome: Secondary | ICD-10-CM | POA: Diagnosis not present

## 2023-06-06 DIAGNOSIS — H0589 Other disorders of orbit: Secondary | ICD-10-CM | POA: Diagnosis not present

## 2023-06-06 DIAGNOSIS — I480 Paroxysmal atrial fibrillation: Secondary | ICD-10-CM | POA: Diagnosis not present

## 2023-06-06 DIAGNOSIS — E1165 Type 2 diabetes mellitus with hyperglycemia: Secondary | ICD-10-CM | POA: Diagnosis not present

## 2023-06-06 DIAGNOSIS — I5032 Chronic diastolic (congestive) heart failure: Secondary | ICD-10-CM | POA: Diagnosis not present

## 2023-06-06 DIAGNOSIS — E66813 Obesity, class 3: Secondary | ICD-10-CM | POA: Diagnosis not present

## 2023-06-06 DIAGNOSIS — E7800 Pure hypercholesterolemia, unspecified: Secondary | ICD-10-CM | POA: Diagnosis not present

## 2023-06-06 DIAGNOSIS — D649 Anemia, unspecified: Secondary | ICD-10-CM | POA: Diagnosis not present

## 2023-06-06 DIAGNOSIS — G4733 Obstructive sleep apnea (adult) (pediatric): Secondary | ICD-10-CM | POA: Diagnosis not present

## 2023-06-06 DIAGNOSIS — Z6841 Body Mass Index (BMI) 40.0 and over, adult: Secondary | ICD-10-CM | POA: Diagnosis not present

## 2023-06-06 NOTE — Telephone Encounter (Signed)
 Called and left detailed voice mail message on kelly -PT -  with [facility] voice mail of PA approval.

## 2023-06-06 NOTE — Telephone Encounter (Signed)
 OK for PT

## 2023-06-07 DIAGNOSIS — M170 Bilateral primary osteoarthritis of knee: Secondary | ICD-10-CM | POA: Diagnosis not present

## 2023-06-07 DIAGNOSIS — E1165 Type 2 diabetes mellitus with hyperglycemia: Secondary | ICD-10-CM | POA: Diagnosis not present

## 2023-06-07 DIAGNOSIS — Z604 Social exclusion and rejection: Secondary | ICD-10-CM | POA: Diagnosis not present

## 2023-06-07 DIAGNOSIS — Z556 Problems related to health literacy: Secondary | ICD-10-CM | POA: Diagnosis not present

## 2023-06-07 DIAGNOSIS — K219 Gastro-esophageal reflux disease without esophagitis: Secondary | ICD-10-CM | POA: Diagnosis not present

## 2023-06-07 DIAGNOSIS — E7800 Pure hypercholesterolemia, unspecified: Secondary | ICD-10-CM | POA: Diagnosis not present

## 2023-06-07 DIAGNOSIS — F319 Bipolar disorder, unspecified: Secondary | ICD-10-CM | POA: Diagnosis not present

## 2023-06-07 DIAGNOSIS — I5032 Chronic diastolic (congestive) heart failure: Secondary | ICD-10-CM | POA: Diagnosis not present

## 2023-06-07 DIAGNOSIS — Z8616 Personal history of COVID-19: Secondary | ICD-10-CM | POA: Diagnosis not present

## 2023-06-07 DIAGNOSIS — G4733 Obstructive sleep apnea (adult) (pediatric): Secondary | ICD-10-CM | POA: Diagnosis not present

## 2023-06-07 DIAGNOSIS — F411 Generalized anxiety disorder: Secondary | ICD-10-CM | POA: Diagnosis not present

## 2023-06-07 DIAGNOSIS — N3281 Overactive bladder: Secondary | ICD-10-CM | POA: Diagnosis not present

## 2023-06-07 DIAGNOSIS — H0589 Other disorders of orbit: Secondary | ICD-10-CM | POA: Diagnosis not present

## 2023-06-07 DIAGNOSIS — G43009 Migraine without aura, not intractable, without status migrainosus: Secondary | ICD-10-CM | POA: Diagnosis not present

## 2023-06-07 DIAGNOSIS — M5030 Other cervical disc degeneration, unspecified cervical region: Secondary | ICD-10-CM | POA: Diagnosis not present

## 2023-06-07 DIAGNOSIS — Z6841 Body Mass Index (BMI) 40.0 and over, adult: Secondary | ICD-10-CM | POA: Diagnosis not present

## 2023-06-07 DIAGNOSIS — G2581 Restless legs syndrome: Secondary | ICD-10-CM | POA: Diagnosis not present

## 2023-06-07 DIAGNOSIS — Z7951 Long term (current) use of inhaled steroids: Secondary | ICD-10-CM | POA: Diagnosis not present

## 2023-06-07 DIAGNOSIS — I110 Hypertensive heart disease with heart failure: Secondary | ICD-10-CM | POA: Diagnosis not present

## 2023-06-07 DIAGNOSIS — D3161 Benign neoplasm of unspecified site of right orbit: Secondary | ICD-10-CM | POA: Diagnosis not present

## 2023-06-07 DIAGNOSIS — I480 Paroxysmal atrial fibrillation: Secondary | ICD-10-CM | POA: Diagnosis not present

## 2023-06-07 DIAGNOSIS — J4520 Mild intermittent asthma, uncomplicated: Secondary | ICD-10-CM | POA: Diagnosis not present

## 2023-06-07 DIAGNOSIS — D6869 Other thrombophilia: Secondary | ICD-10-CM | POA: Diagnosis not present

## 2023-06-07 DIAGNOSIS — E66813 Obesity, class 3: Secondary | ICD-10-CM | POA: Diagnosis not present

## 2023-06-07 DIAGNOSIS — D649 Anemia, unspecified: Secondary | ICD-10-CM | POA: Diagnosis not present

## 2023-06-13 ENCOUNTER — Other Ambulatory Visit: Payer: Self-pay | Admitting: Family Medicine

## 2023-06-13 DIAGNOSIS — F411 Generalized anxiety disorder: Secondary | ICD-10-CM | POA: Diagnosis not present

## 2023-06-13 DIAGNOSIS — Z7951 Long term (current) use of inhaled steroids: Secondary | ICD-10-CM | POA: Diagnosis not present

## 2023-06-13 DIAGNOSIS — N3281 Overactive bladder: Secondary | ICD-10-CM | POA: Diagnosis not present

## 2023-06-13 DIAGNOSIS — E66813 Obesity, class 3: Secondary | ICD-10-CM | POA: Diagnosis not present

## 2023-06-13 DIAGNOSIS — E7800 Pure hypercholesterolemia, unspecified: Secondary | ICD-10-CM | POA: Diagnosis not present

## 2023-06-13 DIAGNOSIS — D3161 Benign neoplasm of unspecified site of right orbit: Secondary | ICD-10-CM | POA: Diagnosis not present

## 2023-06-13 DIAGNOSIS — I5032 Chronic diastolic (congestive) heart failure: Secondary | ICD-10-CM | POA: Diagnosis not present

## 2023-06-13 DIAGNOSIS — E1165 Type 2 diabetes mellitus with hyperglycemia: Secondary | ICD-10-CM | POA: Diagnosis not present

## 2023-06-13 DIAGNOSIS — Z556 Problems related to health literacy: Secondary | ICD-10-CM | POA: Diagnosis not present

## 2023-06-13 DIAGNOSIS — M5030 Other cervical disc degeneration, unspecified cervical region: Secondary | ICD-10-CM | POA: Diagnosis not present

## 2023-06-13 DIAGNOSIS — Z8616 Personal history of COVID-19: Secondary | ICD-10-CM | POA: Diagnosis not present

## 2023-06-13 DIAGNOSIS — D6869 Other thrombophilia: Secondary | ICD-10-CM | POA: Diagnosis not present

## 2023-06-13 DIAGNOSIS — G43009 Migraine without aura, not intractable, without status migrainosus: Secondary | ICD-10-CM | POA: Diagnosis not present

## 2023-06-13 DIAGNOSIS — G4733 Obstructive sleep apnea (adult) (pediatric): Secondary | ICD-10-CM | POA: Diagnosis not present

## 2023-06-13 DIAGNOSIS — Z604 Social exclusion and rejection: Secondary | ICD-10-CM | POA: Diagnosis not present

## 2023-06-13 DIAGNOSIS — I110 Hypertensive heart disease with heart failure: Secondary | ICD-10-CM | POA: Diagnosis not present

## 2023-06-13 DIAGNOSIS — H0589 Other disorders of orbit: Secondary | ICD-10-CM | POA: Diagnosis not present

## 2023-06-13 DIAGNOSIS — J4520 Mild intermittent asthma, uncomplicated: Secondary | ICD-10-CM | POA: Diagnosis not present

## 2023-06-13 DIAGNOSIS — F319 Bipolar disorder, unspecified: Secondary | ICD-10-CM | POA: Diagnosis not present

## 2023-06-13 DIAGNOSIS — Z6841 Body Mass Index (BMI) 40.0 and over, adult: Secondary | ICD-10-CM | POA: Diagnosis not present

## 2023-06-13 DIAGNOSIS — I480 Paroxysmal atrial fibrillation: Secondary | ICD-10-CM | POA: Diagnosis not present

## 2023-06-13 DIAGNOSIS — D649 Anemia, unspecified: Secondary | ICD-10-CM | POA: Diagnosis not present

## 2023-06-13 DIAGNOSIS — M170 Bilateral primary osteoarthritis of knee: Secondary | ICD-10-CM | POA: Diagnosis not present

## 2023-06-13 DIAGNOSIS — K219 Gastro-esophageal reflux disease without esophagitis: Secondary | ICD-10-CM | POA: Diagnosis not present

## 2023-06-13 DIAGNOSIS — G2581 Restless legs syndrome: Secondary | ICD-10-CM | POA: Diagnosis not present

## 2023-06-14 ENCOUNTER — Other Ambulatory Visit: Payer: Self-pay | Admitting: *Deleted

## 2023-06-14 ENCOUNTER — Other Ambulatory Visit: Payer: Self-pay | Admitting: Family Medicine

## 2023-06-14 DIAGNOSIS — R300 Dysuria: Secondary | ICD-10-CM

## 2023-06-14 MED ORDER — DILTIAZEM HCL ER COATED BEADS 180 MG PO CP24
180.0000 mg | ORAL_CAPSULE | Freq: Every day | ORAL | 1 refills | Status: DC
Start: 2023-06-14 — End: 2023-09-05

## 2023-06-14 NOTE — Telephone Encounter (Signed)
 Patient aware.

## 2023-06-14 NOTE — Telephone Encounter (Signed)
 Donna Sexton states she was in the hospital last month with sepsis. She states she took a home test and it came back positive for WBC. She would like Bactrim  sent to the pharmacy.   Can we order a urine culture?

## 2023-06-14 NOTE — Telephone Encounter (Signed)
 Copied from CRM [id number]. Topic: Clinical - Medication Refill >> Jun 14, 2023  8:32 AM Lenon Radar A wrote: Medication: sulfamethoxazole -trimethoprim  (BACTRIM  DS) 800-160 MG tablet  Has the patient contacted their pharmacy? No (Agent: If no, request that the patient contact the pharmacy for the refill. If patient does not wish to contact the pharmacy document the reason why and proceed with request.) (Agent: If yes, when and what did the pharmacy advise?)  Patient called in for medication. Patient did not reach out to pharmacy to request refill. Patient wants to know if this one can't be filled, if she should take any other medication.   This is the patient's preferred pharmacy:  [facility] [place], [place] - [address] [place] [address] Phone: [phone] Fax: [phone]  Is this the correct pharmacy for this prescription? Yes If no, delete pharmacy and type the correct one.   Has the prescription been filled recently? No  Is the patient out of the medication? Yes  Has the patient been seen for an appointment in the last year OR does the patient have an upcoming appointment? Yes  Can we respond through MyChart? Yes  Agent: Please be advised that Rx refills may take up to 3 business days. We ask that you follow-up with your pharmacy.

## 2023-06-14 NOTE — Telephone Encounter (Signed)
 Order UA dipstick and culture. I can treat accordingly.

## 2023-06-16 DIAGNOSIS — K219 Gastro-esophageal reflux disease without esophagitis: Secondary | ICD-10-CM | POA: Diagnosis not present

## 2023-06-16 DIAGNOSIS — I480 Paroxysmal atrial fibrillation: Secondary | ICD-10-CM | POA: Diagnosis not present

## 2023-06-16 DIAGNOSIS — F411 Generalized anxiety disorder: Secondary | ICD-10-CM | POA: Diagnosis not present

## 2023-06-16 DIAGNOSIS — E66813 Obesity, class 3: Secondary | ICD-10-CM | POA: Diagnosis not present

## 2023-06-16 DIAGNOSIS — F319 Bipolar disorder, unspecified: Secondary | ICD-10-CM | POA: Diagnosis not present

## 2023-06-16 DIAGNOSIS — D6869 Other thrombophilia: Secondary | ICD-10-CM | POA: Diagnosis not present

## 2023-06-16 DIAGNOSIS — Z604 Social exclusion and rejection: Secondary | ICD-10-CM | POA: Diagnosis not present

## 2023-06-16 DIAGNOSIS — I5032 Chronic diastolic (congestive) heart failure: Secondary | ICD-10-CM | POA: Diagnosis not present

## 2023-06-16 DIAGNOSIS — I110 Hypertensive heart disease with heart failure: Secondary | ICD-10-CM | POA: Diagnosis not present

## 2023-06-16 DIAGNOSIS — E1165 Type 2 diabetes mellitus with hyperglycemia: Secondary | ICD-10-CM | POA: Diagnosis not present

## 2023-06-16 DIAGNOSIS — E7800 Pure hypercholesterolemia, unspecified: Secondary | ICD-10-CM | POA: Diagnosis not present

## 2023-06-16 DIAGNOSIS — G4733 Obstructive sleep apnea (adult) (pediatric): Secondary | ICD-10-CM | POA: Diagnosis not present

## 2023-06-16 DIAGNOSIS — M5030 Other cervical disc degeneration, unspecified cervical region: Secondary | ICD-10-CM | POA: Diagnosis not present

## 2023-06-16 DIAGNOSIS — D3161 Benign neoplasm of unspecified site of right orbit: Secondary | ICD-10-CM | POA: Diagnosis not present

## 2023-06-16 DIAGNOSIS — H0589 Other disorders of orbit: Secondary | ICD-10-CM | POA: Diagnosis not present

## 2023-06-16 DIAGNOSIS — N3281 Overactive bladder: Secondary | ICD-10-CM | POA: Diagnosis not present

## 2023-06-16 DIAGNOSIS — G43009 Migraine without aura, not intractable, without status migrainosus: Secondary | ICD-10-CM | POA: Diagnosis not present

## 2023-06-16 DIAGNOSIS — Z556 Problems related to health literacy: Secondary | ICD-10-CM | POA: Diagnosis not present

## 2023-06-16 DIAGNOSIS — Z7951 Long term (current) use of inhaled steroids: Secondary | ICD-10-CM | POA: Diagnosis not present

## 2023-06-16 DIAGNOSIS — D649 Anemia, unspecified: Secondary | ICD-10-CM | POA: Diagnosis not present

## 2023-06-16 DIAGNOSIS — M170 Bilateral primary osteoarthritis of knee: Secondary | ICD-10-CM | POA: Diagnosis not present

## 2023-06-16 DIAGNOSIS — J4520 Mild intermittent asthma, uncomplicated: Secondary | ICD-10-CM | POA: Diagnosis not present

## 2023-06-16 DIAGNOSIS — Z8616 Personal history of COVID-19: Secondary | ICD-10-CM | POA: Diagnosis not present

## 2023-06-16 DIAGNOSIS — G2581 Restless legs syndrome: Secondary | ICD-10-CM | POA: Diagnosis not present

## 2023-06-16 DIAGNOSIS — Z6841 Body Mass Index (BMI) 40.0 and over, adult: Secondary | ICD-10-CM | POA: Diagnosis not present

## 2023-06-20 ENCOUNTER — Other Ambulatory Visit: Payer: Self-pay

## 2023-06-20 ENCOUNTER — Ambulatory Visit: Payer: Self-pay

## 2023-06-20 MED ORDER — TORSEMIDE 100 MG PO TABS
50.0000 mg | ORAL_TABLET | Freq: Every day | ORAL | 1 refills | Status: AC
Start: 2023-06-20 — End: ?

## 2023-06-20 MED ORDER — TORSEMIDE 100 MG PO TABS: 50.0000 mg | ORAL_TABLET | Freq: Every day | ORAL | 1 refills | Status: DC

## 2023-06-20 MED ORDER — APIXABAN 5 MG PO TABS: 5.0000 mg | ORAL_TABLET | Freq: Two times a day (BID) | ORAL | 1 refills | Status: DC

## 2023-06-20 MED ORDER — APIXABAN 5 MG PO TABS
5.0000 mg | ORAL_TABLET | Freq: Two times a day (BID) | ORAL | 1 refills | Status: DC
Start: 2023-06-20 — End: 2023-10-05

## 2023-06-20 NOTE — Telephone Encounter (Signed)
 Patient requesting rx rf of  Eliquis  - last written 03/10/2023 by Dr.  Thelma Fire And  Torsemide 100mg  - last written by historical provider  Patient is out of both medications.  She did mention that she will be seeing Cardiologist soon Last OV 05/25/2023 Upcoming appt 07/06/2023

## 2023-06-20 NOTE — Telephone Encounter (Signed)
 Had to resend to [facility] as the original prescription was showing as printed.  Patient will not have missed any doses of medication.

## 2023-06-20 NOTE — Telephone Encounter (Signed)
 Summary: need refill for Eliquis (bloodthinner ) and patient is out of the medication , refused to fill medications   Copied From CRM [id number]. Reason for Triage: the pharmacy been calling patient provider that the provider will not fill the medications one is a blood thinner     Eliquis  , and torsemide (DEMADEX) 20 MG tablet Patient is out of both of the medications Patient callback number [phone]     Answer Assessment - Initial Assessment Questions 1. REASON FOR CALL or QUESTION: "What is your reason for calling today?" or "How can I best help you?" or "What question do you have that I can help answer?"     Nurse returned patient call from earlier today regarding refills needed.  Pt stated she has not been able to refill Eliquis  and torsemide (DEMADEX) 20 MG tablet.  Pt stated she has an upcoming appt with cardiologist and is not sure if this is why her medication has not been refilled.  Pt would like someone to call her back after reviews medication and discuss plan of care r/t medications.  Protocols used: Information Only Call - No Triage-A-AH

## 2023-06-20 NOTE — Telephone Encounter (Signed)
 Scription's sent to pharmacy.  Please let her know that it is really important that she not run out of her Eliquis  completely has a very short half-life and can increase her risk for blood clot if she misses doses.  So Please let me know ahead of time always happy to refill it.

## 2023-06-21 DIAGNOSIS — E7800 Pure hypercholesterolemia, unspecified: Secondary | ICD-10-CM | POA: Diagnosis not present

## 2023-06-21 DIAGNOSIS — Z6841 Body Mass Index (BMI) 40.0 and over, adult: Secondary | ICD-10-CM | POA: Diagnosis not present

## 2023-06-21 DIAGNOSIS — Z604 Social exclusion and rejection: Secondary | ICD-10-CM | POA: Diagnosis not present

## 2023-06-21 DIAGNOSIS — D6869 Other thrombophilia: Secondary | ICD-10-CM | POA: Diagnosis not present

## 2023-06-21 DIAGNOSIS — N3281 Overactive bladder: Secondary | ICD-10-CM | POA: Diagnosis not present

## 2023-06-21 DIAGNOSIS — D3161 Benign neoplasm of unspecified site of right orbit: Secondary | ICD-10-CM | POA: Diagnosis not present

## 2023-06-21 DIAGNOSIS — I110 Hypertensive heart disease with heart failure: Secondary | ICD-10-CM | POA: Diagnosis not present

## 2023-06-21 DIAGNOSIS — Z556 Problems related to health literacy: Secondary | ICD-10-CM | POA: Diagnosis not present

## 2023-06-21 DIAGNOSIS — H0589 Other disorders of orbit: Secondary | ICD-10-CM | POA: Diagnosis not present

## 2023-06-21 DIAGNOSIS — M5030 Other cervical disc degeneration, unspecified cervical region: Secondary | ICD-10-CM | POA: Diagnosis not present

## 2023-06-21 DIAGNOSIS — M170 Bilateral primary osteoarthritis of knee: Secondary | ICD-10-CM | POA: Diagnosis not present

## 2023-06-21 DIAGNOSIS — Z8616 Personal history of COVID-19: Secondary | ICD-10-CM | POA: Diagnosis not present

## 2023-06-21 DIAGNOSIS — I5032 Chronic diastolic (congestive) heart failure: Secondary | ICD-10-CM | POA: Diagnosis not present

## 2023-06-21 DIAGNOSIS — Z7951 Long term (current) use of inhaled steroids: Secondary | ICD-10-CM | POA: Diagnosis not present

## 2023-06-21 DIAGNOSIS — D649 Anemia, unspecified: Secondary | ICD-10-CM | POA: Diagnosis not present

## 2023-06-21 DIAGNOSIS — G2581 Restless legs syndrome: Secondary | ICD-10-CM | POA: Diagnosis not present

## 2023-06-21 DIAGNOSIS — I480 Paroxysmal atrial fibrillation: Secondary | ICD-10-CM | POA: Diagnosis not present

## 2023-06-21 DIAGNOSIS — F319 Bipolar disorder, unspecified: Secondary | ICD-10-CM | POA: Diagnosis not present

## 2023-06-21 DIAGNOSIS — K219 Gastro-esophageal reflux disease without esophagitis: Secondary | ICD-10-CM | POA: Diagnosis not present

## 2023-06-21 DIAGNOSIS — G4733 Obstructive sleep apnea (adult) (pediatric): Secondary | ICD-10-CM | POA: Diagnosis not present

## 2023-06-21 DIAGNOSIS — E66813 Obesity, class 3: Secondary | ICD-10-CM | POA: Diagnosis not present

## 2023-06-21 DIAGNOSIS — J4520 Mild intermittent asthma, uncomplicated: Secondary | ICD-10-CM | POA: Diagnosis not present

## 2023-06-21 DIAGNOSIS — F411 Generalized anxiety disorder: Secondary | ICD-10-CM | POA: Diagnosis not present

## 2023-06-21 DIAGNOSIS — G43009 Migraine without aura, not intractable, without status migrainosus: Secondary | ICD-10-CM | POA: Diagnosis not present

## 2023-06-21 DIAGNOSIS — E1165 Type 2 diabetes mellitus with hyperglycemia: Secondary | ICD-10-CM | POA: Diagnosis not present

## 2023-06-22 ENCOUNTER — Ambulatory Visit (HOSPITAL_COMMUNITY)
Admission: RE | Admit: 2023-06-22 | Discharge: 2023-06-22 | Disposition: A | Discharge status: Home / Self Care | Source: Ambulatory Visit | Attending: Internal Medicine | Admitting: Internal Medicine

## 2023-06-22 VITALS — BP 96/64 | HR 59 | Ht 64.5 in | Wt 251.8 lb

## 2023-06-22 DIAGNOSIS — H0589 Other disorders of orbit: Secondary | ICD-10-CM | POA: Diagnosis not present

## 2023-06-22 DIAGNOSIS — I110 Hypertensive heart disease with heart failure: Secondary | ICD-10-CM | POA: Diagnosis not present

## 2023-06-22 DIAGNOSIS — Z7951 Long term (current) use of inhaled steroids: Secondary | ICD-10-CM | POA: Diagnosis not present

## 2023-06-22 DIAGNOSIS — Z556 Problems related to health literacy: Secondary | ICD-10-CM | POA: Diagnosis not present

## 2023-06-22 DIAGNOSIS — Z6841 Body Mass Index (BMI) 40.0 and over, adult: Secondary | ICD-10-CM | POA: Diagnosis not present

## 2023-06-22 DIAGNOSIS — M170 Bilateral primary osteoarthritis of knee: Secondary | ICD-10-CM | POA: Diagnosis not present

## 2023-06-22 DIAGNOSIS — I480 Paroxysmal atrial fibrillation: Secondary | ICD-10-CM | POA: Diagnosis not present

## 2023-06-22 DIAGNOSIS — N3281 Overactive bladder: Secondary | ICD-10-CM | POA: Diagnosis not present

## 2023-06-22 DIAGNOSIS — F411 Generalized anxiety disorder: Secondary | ICD-10-CM | POA: Diagnosis not present

## 2023-06-22 DIAGNOSIS — E66813 Obesity, class 3: Secondary | ICD-10-CM | POA: Diagnosis not present

## 2023-06-22 DIAGNOSIS — D6869 Other thrombophilia: Secondary | ICD-10-CM

## 2023-06-22 DIAGNOSIS — Z604 Social exclusion and rejection: Secondary | ICD-10-CM | POA: Diagnosis not present

## 2023-06-22 DIAGNOSIS — Z5181 Encounter for therapeutic drug level monitoring: Secondary | ICD-10-CM

## 2023-06-22 DIAGNOSIS — M5030 Other cervical disc degeneration, unspecified cervical region: Secondary | ICD-10-CM | POA: Diagnosis not present

## 2023-06-22 DIAGNOSIS — F319 Bipolar disorder, unspecified: Secondary | ICD-10-CM | POA: Diagnosis not present

## 2023-06-22 DIAGNOSIS — E1165 Type 2 diabetes mellitus with hyperglycemia: Secondary | ICD-10-CM | POA: Diagnosis not present

## 2023-06-22 DIAGNOSIS — K219 Gastro-esophageal reflux disease without esophagitis: Secondary | ICD-10-CM | POA: Diagnosis not present

## 2023-06-22 DIAGNOSIS — I5032 Chronic diastolic (congestive) heart failure: Secondary | ICD-10-CM | POA: Diagnosis not present

## 2023-06-22 DIAGNOSIS — G2581 Restless legs syndrome: Secondary | ICD-10-CM | POA: Diagnosis not present

## 2023-06-22 DIAGNOSIS — I4819 Other persistent atrial fibrillation: Secondary | ICD-10-CM

## 2023-06-22 DIAGNOSIS — I4891 Unspecified atrial fibrillation: Secondary | ICD-10-CM | POA: Diagnosis not present

## 2023-06-22 DIAGNOSIS — G43009 Migraine without aura, not intractable, without status migrainosus: Secondary | ICD-10-CM | POA: Diagnosis not present

## 2023-06-22 DIAGNOSIS — E7800 Pure hypercholesterolemia, unspecified: Secondary | ICD-10-CM | POA: Diagnosis not present

## 2023-06-22 DIAGNOSIS — J4520 Mild intermittent asthma, uncomplicated: Secondary | ICD-10-CM | POA: Diagnosis not present

## 2023-06-22 DIAGNOSIS — D649 Anemia, unspecified: Secondary | ICD-10-CM | POA: Diagnosis not present

## 2023-06-22 DIAGNOSIS — D3161 Benign neoplasm of unspecified site of right orbit: Secondary | ICD-10-CM | POA: Diagnosis not present

## 2023-06-22 DIAGNOSIS — Z79899 Other long term (current) drug therapy: Secondary | ICD-10-CM

## 2023-06-22 DIAGNOSIS — G4733 Obstructive sleep apnea (adult) (pediatric): Secondary | ICD-10-CM | POA: Diagnosis not present

## 2023-06-22 DIAGNOSIS — Z8616 Personal history of COVID-19: Secondary | ICD-10-CM | POA: Diagnosis not present

## 2023-06-22 NOTE — Patient Instructions (Addendum)
 Call with update of current medication list [phone] option 2

## 2023-06-22 NOTE — Progress Notes (Signed)
 Primary Care Physician: Cydney Draft, MD Primary Cardiologist: Peter Swaziland, MD Electrophysiologist: None     Referring Physician: Dr. Katherene Pals is a 73 y.o. female with a history of HLD, HTN, HFpEF, morbid obesity, pre diabetes (6.2% A1C), ventricular noncompaction on MRI, and atrial fibrillation who presents for consultation in the [facility]. Hospital admission 2/19-28/25 for Afib with RVR likely secondary to Covid-19 infection. She had severe sepsis with encephalopathy and hypoxic respiratory failure due to Covid-19. Discharged on amiodarone  200 mg daily. Patient is on Eliquis  5 mg BID for a CHADS2VASC score of 4.  On evaluation today, she is currently in sinus tachycardia. She is currently in a rehab facility and has been there for a little over a week. She notes as recent as yesterday during rehab her oxygen level will decrease with activity and it improves when taking a break and doing deep slow breathing. She notes worsening swelling in bilateral lower extremities since hospital discharge. She is taking lasix  40 mg BID; not on potassium supplementation. She lives by herself at home.   On follow up 06/22/23, she is currently in NSR. She is back to living at home. She is here with ex spouse today. They are not clear as to current medication list / what she is taking daily. Readmitted to hospital 04/2023 for increased somnolence and lethargy likely secondary to UTI. Diuresed a substantial amount and patient noted improvement. Seen by Cardiology on 5/7 with decrease of lasix  to 40 mg daily due to hypotension and lightheadedness. She is currently taking amiodarone  200 mg daily. No missed doses of Eliquis  5 mg BID.   Today, she denies symptoms of palpitations, chest pain, shortness of breath, orthopnea, PND, lower extremity edema, dizziness, presyncope, syncope, snoring, daytime somnolence, bleeding, or neurologic sequela. The patient is  tolerating medications without difficulties and is otherwise without complaint today.    she has a BMI of Body mass index is 42.55 kg/m.Aaron Aas Filed Weights   06/22/23 1025  Weight: 114.2 kg     Current Outpatient Medications  Medication Sig Dispense Refill   acetaminophen  (TYLENOL ) 500 MG tablet Take 1 tablet (500 mg total) by mouth every 6 (six) hours as needed for mild pain (pain score 1-3) (or Fever >/= 101). (Patient taking differently: Take 500 mg by mouth as needed for mild pain (pain score 1-3) (or Fever >/= 101).)     amiodarone  (PACERONE ) 200 MG tablet Take 1 tablet (200 mg total) by mouth daily.     apixaban  (ELIQUIS ) 5 MG TABS tablet Take 1 tablet (5 mg total) by mouth 2 (two) times daily. 60 tablet 1   atorvastatin  (LIPITOR) 40 MG tablet Take 1 tablet (40 mg total) by mouth daily. 90 tablet 0   divalproex  (DEPAKOTE  ER) 500 MG 24 hr tablet Take 3 tablets (1,500 mg total) by mouth at bedtime. 270 tablet 1   famotidine  (PEPCID ) 20 MG tablet Take 20 mg by mouth daily.     levalbuterol  (XOPENEX ) 0.63 MG/3ML nebulizer solution Take 3 mLs (0.63 mg total) by nebulization every 6 (six) hours as needed for wheezing or shortness of breath.     melatonin 3 MG TABS tablet Take 1 tablet (3 mg total) by mouth at bedtime as needed.     metoprolol  tartrate (LOPRESSOR ) 25 MG tablet Take 1 tablet (25 mg total) by mouth 2 (two) times daily. 60 tablet 3   mometasone -formoterol  (DULERA ) 100-5 MCG/ACT AERO Inhale 2 puffs  into the lungs 2 (two) times daily.     oxybutynin  (DITROPAN ) 5 MG tablet Take 1 tablet (5 mg total) by mouth 2 (two) times daily. 180 tablet 1   polyethylene glycol (MIRALAX  / GLYCOLAX ) 17 g packet Take 17 g by mouth daily as needed for moderate constipation.     pregabalin  (LYRICA ) 50 MG capsule Take 1 capsule (50 mg total) by mouth 3 (three) times daily. 270 capsule 1   promethazine  (PHENERGAN ) 25 MG tablet TAKE ONE TABLET BY MOUTH EVERY 8 HOURS AS NEEDED FOR NAUSEA AND VOMITING 90  tablet 0   senna-docusate (SENOKOT-S) 8.6-50 MG tablet Take 2 tablets by mouth at bedtime.     torsemide (DEMADEX) 100 MG tablet Take 0.5 tablets (50 mg total) by mouth daily. 45 tablet 1   diltiazem  (CARDIZEM  CD) 180 MG 24 hr capsule Take 1 capsule (180 mg total) by mouth daily. 90 capsule 1   furosemide  (LASIX ) 40 MG tablet Take 1 tablet (40 mg total) by mouth daily. 90 tablet 2   nitrofurantoin , macrocrystal-monohydrate, (MACROBID ) 100 MG capsule Take 1 capsule (100 mg total) by mouth 2 (two) times daily. 10 capsule 0   sulfamethoxazole -trimethoprim  (BACTRIM  DS) 800-160 MG tablet Take 1 tablet by mouth 2 (two) times daily. 10 tablet 0   No current facility-administered medications for this encounter.    Atrial Fibrillation Management history:  Previous antiarrhythmic drugs: amiodarone  Previous cardioversions: none Previous ablations: none Anticoagulation history: Eliquis    ROS- All systems are reviewed and negative except as per the HPI above.  Physical Exam: BP 96/64   Pulse (!) 59   Ht 5' 4.5 (1.638 m)   Wt 114.2 kg   BMI 42.55 kg/m   GEN- The patient is well appearing, alert and oriented x 3 today.   Neck - no JVD or carotid bruit noted Lungs- Clear to ausculation bilaterally, normal work of breathing Heart- Regular rate and rhythm, no murmurs, rubs or gallops, PMI not laterally displaced Extremities- no clubbing, cyanosis, or edema Skin - no rash or ecchymosis noted   EKG today demonstrates  Vent. rate 59 BPM PR interval 166 ms QRS duration 80 ms QT/QTcB 418/413 ms P-R-T axes 61 67 186 Sinus bradycardia Low voltage QRS Cannot rule out Anterior infarct , age undetermined Abnormal ECG When compared with ECG of 13-Apr-2023 17:18, PREVIOUS ECG IS PRESENT  Echo 03/02/23 demonstrated  1. Left ventricular ejection fraction, by estimation, is 70 to 75%. The  left ventricle has hyperdynamic function. Left ventricular endocardial  border not optimally defined to  evaluate regional wall motion. Left  ventricular diastolic parameters are  indeterminate.   2. Right ventricular systolic function is normal. The right ventricular  size is normal. Tricuspid regurgitation signal is inadequate for assessing  PA pressure.   3. The mitral valve was not well visualized. No evidence of mitral valve  regurgitation. No evidence of mitral stenosis.   4. The aortic valve was not well visualized. Aortic valve regurgitation  is not visualized. No aortic stenosis is present.   5. The inferior vena cava is normal in size with greater than 50%  respiratory variability, suggesting right atrial pressure of 3 mmHg.   ASSESSMENT & PLAN CHA2DS2-VASc Score = 4  The patient's score is based upon: CHF History: 1 HTN History: 1 Diabetes History: 0 Stroke History: 0 Vascular Disease History: 0 Age Score: 1 Gender Score: 1       ASSESSMENT AND PLAN: Persistent Atrial Fibrillation (ICD10:  I48.19) The patient's CHA2DS2-VASc  score is 4, indicating a 4.8% annual risk of stroke.    She is currently in NSR.   High risk medication monitoring (ICD10: J342684) Patient requires ongoing monitoring for anti-arrhythmic medication which has the potential to cause life threatening arrhythmias or AV block. Qtc stable. Continue amiodarone  200 mg daily. Cmet and TSH drawn 5/15 stable.  Secondary Hypercoagulable State (ICD10:  D68.69) The patient is at significant risk for stroke/thromboembolism based upon her CHA2DS2-VASc Score of 4.  Continue Apixaban  (Eliquis ).  Continue Eliquis  5 mg BID without interruption.  HFpEF/edema Lasix  decreased to 40 mg daily by Cardiology on 5/7. I am not sure if patient has definitively made this transition or still taking 80 mg daily. Notice of weight between today and PCP visit shows interval increase in weight of almost 2 kg. They will contact clinic with what she is taking daily to confirm and can titrate diuresis once confirmed.    Follow up  6 months Afib clinic for amiodarone  surveillance.    Minnie Amber, PA-C  Afib Clinic [facility] [address] [place], [place] [address] [phone]

## 2023-06-23 ENCOUNTER — Encounter (HOSPITAL_COMMUNITY): Payer: Self-pay | Admitting: *Deleted

## 2023-06-26 ENCOUNTER — Ambulatory Visit: Payer: Self-pay

## 2023-06-26 ENCOUNTER — Ambulatory Visit (INDEPENDENT_AMBULATORY_CARE_PROVIDER_SITE_OTHER): Admitting: Family Medicine

## 2023-06-26 VITALS — BP 136/72 | HR 57 | Ht 64.5 in | Wt 251.0 lb

## 2023-06-26 DIAGNOSIS — H9193 Unspecified hearing loss, bilateral: Secondary | ICD-10-CM | POA: Diagnosis not present

## 2023-06-26 DIAGNOSIS — Z87440 Personal history of urinary (tract) infections: Secondary | ICD-10-CM | POA: Diagnosis not present

## 2023-06-26 DIAGNOSIS — I5032 Chronic diastolic (congestive) heart failure: Secondary | ICD-10-CM | POA: Diagnosis not present

## 2023-06-26 LAB — POCT URINALYSIS DIP (CLINITEK)
Bilirubin, UA: NEGATIVE
Blood, UA: NEGATIVE
Glucose, UA: NEGATIVE mg/dL
Ketones, POC UA: NEGATIVE mg/dL
Nitrite, UA: NEGATIVE
POC PROTEIN,UA: NEGATIVE
Spec Grav, UA: 1.015 (ref 1.010–1.025)
Urobilinogen, UA: 1 U/dL
pH, UA: 7 (ref 5.0–8.0)

## 2023-06-26 NOTE — Telephone Encounter (Signed)
 FYI Only or Action Required?: FYI only for provider  Patient was last seen in primary care on 05/25/2023 by Cydney Draft, MD. Called Nurse Triage reporting Hematuria. Symptoms began yesterday. Interventions attempted: Nothing. Symptoms are: stable.  Triage Disposition: See Physician Within 24 Hours  Patient/caregiver understands and will follow disposition?: yes       Copied from CRM [id number]. Topic: Clinical - Red Word Triage >> Jun 26, 2023  8:19 AM Tisa Forester wrote: Red Word that prompted transfer to Nurse Triage: last night  7:00 am when had urinate had  bright red streak  of blood  recently got out of hospital had sepsis  call back number [phone] Reason for Disposition  Blood in urine  (Exception: Could be normal menstrual bleeding.)  Answer Assessment - Initial Assessment Questions 1. COLOR of URINE: Describe the color of the urine.  (e.g., tea-colored, pink, red, bloody) Do you have blood clots in your urine? (e.g., none, pea, grape, small coin)     Streaks of red blood  2. ONSET: When did the bleeding start?      Last night  3. EPISODES: How many times has there been blood in the urine? or How many times today?     Last night x1  4. PAIN with URINATION: Is there any pain with passing your urine? If Yes, ask: How bad is the pain?  (Scale 1-10; or mild, moderate, severe)    - MILD: Complains slightly about urination hurting.    - MODERATE: Interferes with normal activities.      - SEVERE: Excruciating, unwilling or unable to urinate because of the pain.     Mild burning and pressure    5. FEVER: Do you have a fever? If Yes, ask: What is your temperature, how was it measured, and when did it start?     No  6. ASSOCIATED SYMPTOMS: Are you passing urine more frequently than usual?     yes 7. OTHER SYMPTOMS: Do you have any other symptoms? (e.g., back/flank pain, abdomen pain, vomiting)     no  Protocols used: Urine - Blood In-A-AH

## 2023-06-26 NOTE — Patient Instructions (Addendum)
 Limit fluids to 48 ounces per day.   Try to elevate feet for about 20 minutes late afternoon and late morning.  Please weight yourself daily at home.

## 2023-06-26 NOTE — Assessment & Plan Note (Signed)
 Still about 10 pounds to 15 pounds volume overloaded.  Cardiology supposed be calling her back this week to see if they want a make any adjustments or changes to her diuretic regimen.  We also discussed maybe trying some lower compression stockings such as 15-20's since she cannot get the tighter ones on they are painful to wear.  Discussed elevating the feet for 20 minutes twice a day and then trying to get back into that bed at night to get her feet propped up will help with that venous return and we discussed that today as well.

## 2023-06-26 NOTE — Progress Notes (Addendum)
 Established Patient Office Visit  Subjective  Patient ID: Donna Sexton, female    DOB: [birth year]  Age: 73 y.o. MRN: [id number]  Chief Complaint  Patient presents with   Hematuria    Pt states she had some blood in urine overnight. Son stated they had done some OTC UTI tests and it stated that it showed possible UTI.    HPI  Pt states she had some blood in urine overnight.  She said she had just urinated in the toilet and when she got up off the toilet she noticed a red streak of blood.  She had not had a bowel movement.  Caregiver, Autry Legions,  stated they had done some OTC UTI tests and it stated that it showed possible UTI.  He said about 2 weeks ago she actually had really dark urine and they actually almost brought her in that week but it actually started to lighten up a little bit so they ended up not coming in sooner.  She says she has some occasional burning in her lower abdomen but not necessarily in the urethral area with urination.  No fevers or chills or sweats.  She still struggling with significant swelling in her ankles she still sleeping in a recliner but they are trying to get her into a bed where she would be able to elevate her feet.  He is taking her medications regularly they have tried to put on the compression stockings but she has so much tenderness and pain in her lower legs that they have not been able to get them on.  She still sleeping in a recliner but they are planning on getting her back into her bed where she could elevate her feet.  Her son also reports that she has had some hearing loss.    ROS    Objective:     BP 136/72   Pulse (!) 57   Ht 5' 4.5 (1.638 m)   Wt 251 lb (113.9 kg)   SpO2 95%   BMI 42.42 kg/m    Physical Exam HENT:     Right Ear: Tympanic membrane and ear canal normal.     Left Ear: Tympanic membrane and ear canal normal.   Musculoskeletal:     Comments: 2+ pitting edema in both lower extremities.      Results for orders  placed or performed in visit on 06/26/23  POCT URINALYSIS DIP (CLINITEK)  Result Value Ref Range   Color, UA yellow yellow   Clarity, UA cloudy (A) clear   Glucose, UA negative negative mg/dL   Bilirubin, UA negative negative   Ketones, POC UA negative negative mg/dL   Spec Grav, UA 4.098 1.191 - 1.025   Blood, UA negative negative   pH, UA 7.0 5.0 - 8.0   POC PROTEIN,UA negative negative, trace   Urobilinogen, UA 1.0 0.2 or 1.0 E.U./dL   Nitrite, UA Negative Negative   Leukocytes, UA Small (1+) (A) Negative      The 10-year ASCVD risk score (Arnett DK, et al., 2019) is: 20.5%    Assessment & Plan:   Problem List Items Addressed This Visit       Cardiovascular and Mediastinum   Chronic heart failure with preserved ejection fraction (HCC)   Still about 10 pounds to 15 pounds volume overloaded.  Cardiology supposed be calling her back this week to see if they want a make any adjustments or changes to her diuretic regimen.  We also discussed maybe trying some  lower compression stockings such as 15-20's since she cannot get the tighter ones on they are painful to wear.  Discussed elevating the feet for 20 minutes twice a day and then trying to get back into that bed at night to get her feet propped up will help with that venous return and we discussed that today as well.      Relevant Orders   Basic Metabolic Panel (BMET)   Other Visit Diagnoses       Bilateral hearing loss, unspecified hearing loss type    -  Primary   Relevant Orders   Ambulatory referral to ENT     History of UTI       Relevant Orders   POCT URINALYSIS DIP (CLINITEK) (Completed)   Urine Culture       Hearing loss-no cerumen impaction will refer to ENT in [facility] for further workup.  Blood in urine. She did have a little blood streak in her urine last night but no symptoms such as dysuria etc.  Urinalysis today just showed leukocytes we will send for culture on that, treat prophylactically this  time.  Call develops any new or worsening symptoms.   I spent 40 minutes on the day of the encounter to include pre-visit record review, face-to-face time with the patient and post visit ordering of test.  No follow-ups on file.    Duaine German, MD

## 2023-06-27 ENCOUNTER — Ambulatory Visit: Payer: Self-pay | Admitting: Family Medicine

## 2023-06-27 DIAGNOSIS — R7302 Impaired glucose tolerance (oral): Secondary | ICD-10-CM

## 2023-06-27 LAB — BASIC METABOLIC PANEL WITH GFR
BUN/Creatinine Ratio: 13 (ref 12–28)
BUN: 12 mg/dL (ref 8–27)
CO2: 27 mmol/L (ref 20–29)
Calcium: 9.3 mg/dL (ref 8.7–10.3)
Chloride: 96 mmol/L (ref 96–106)
Creatinine, Ser: 0.96 mg/dL (ref 0.57–1.00)
Glucose: 85 mg/dL (ref 70–99)
Potassium: 3.3 mmol/L — ABNORMAL LOW (ref 3.5–5.2)
Sodium: 143 mmol/L (ref 134–144)
eGFR: 62 mL/min/{1.73_m2} (ref >59)

## 2023-06-27 NOTE — Progress Notes (Signed)
 Donna Sexton, potassium is a little bit low probably from the diuretics that you are taking do you have a potassium pill at home that is prescription?  If not I will send over 1 that is low-dose to take daily especially while you are on these diuretics.

## 2023-06-27 NOTE — Telephone Encounter (Signed)
 Pt was seen by Dr. Greer Leak 6/16.

## 2023-06-28 DIAGNOSIS — H0589 Other disorders of orbit: Secondary | ICD-10-CM | POA: Diagnosis not present

## 2023-06-28 DIAGNOSIS — Z7951 Long term (current) use of inhaled steroids: Secondary | ICD-10-CM | POA: Diagnosis not present

## 2023-06-28 DIAGNOSIS — Z6841 Body Mass Index (BMI) 40.0 and over, adult: Secondary | ICD-10-CM | POA: Diagnosis not present

## 2023-06-28 DIAGNOSIS — F319 Bipolar disorder, unspecified: Secondary | ICD-10-CM | POA: Diagnosis not present

## 2023-06-28 DIAGNOSIS — J4520 Mild intermittent asthma, uncomplicated: Secondary | ICD-10-CM | POA: Diagnosis not present

## 2023-06-28 DIAGNOSIS — G2581 Restless legs syndrome: Secondary | ICD-10-CM | POA: Diagnosis not present

## 2023-06-28 DIAGNOSIS — D649 Anemia, unspecified: Secondary | ICD-10-CM | POA: Diagnosis not present

## 2023-06-28 DIAGNOSIS — I5032 Chronic diastolic (congestive) heart failure: Secondary | ICD-10-CM | POA: Diagnosis not present

## 2023-06-28 DIAGNOSIS — M170 Bilateral primary osteoarthritis of knee: Secondary | ICD-10-CM | POA: Diagnosis not present

## 2023-06-28 DIAGNOSIS — E7800 Pure hypercholesterolemia, unspecified: Secondary | ICD-10-CM | POA: Diagnosis not present

## 2023-06-28 DIAGNOSIS — E1165 Type 2 diabetes mellitus with hyperglycemia: Secondary | ICD-10-CM | POA: Diagnosis not present

## 2023-06-28 DIAGNOSIS — N3281 Overactive bladder: Secondary | ICD-10-CM | POA: Diagnosis not present

## 2023-06-28 DIAGNOSIS — D3161 Benign neoplasm of unspecified site of right orbit: Secondary | ICD-10-CM | POA: Diagnosis not present

## 2023-06-28 DIAGNOSIS — G43009 Migraine without aura, not intractable, without status migrainosus: Secondary | ICD-10-CM | POA: Diagnosis not present

## 2023-06-28 DIAGNOSIS — I110 Hypertensive heart disease with heart failure: Secondary | ICD-10-CM | POA: Diagnosis not present

## 2023-06-28 DIAGNOSIS — D6869 Other thrombophilia: Secondary | ICD-10-CM | POA: Diagnosis not present

## 2023-06-28 DIAGNOSIS — K219 Gastro-esophageal reflux disease without esophagitis: Secondary | ICD-10-CM | POA: Diagnosis not present

## 2023-06-28 DIAGNOSIS — Z556 Problems related to health literacy: Secondary | ICD-10-CM | POA: Diagnosis not present

## 2023-06-28 DIAGNOSIS — F411 Generalized anxiety disorder: Secondary | ICD-10-CM | POA: Diagnosis not present

## 2023-06-28 DIAGNOSIS — I480 Paroxysmal atrial fibrillation: Secondary | ICD-10-CM | POA: Diagnosis not present

## 2023-06-28 DIAGNOSIS — Z8616 Personal history of COVID-19: Secondary | ICD-10-CM | POA: Diagnosis not present

## 2023-06-28 DIAGNOSIS — M5030 Other cervical disc degeneration, unspecified cervical region: Secondary | ICD-10-CM | POA: Diagnosis not present

## 2023-06-28 DIAGNOSIS — G4733 Obstructive sleep apnea (adult) (pediatric): Secondary | ICD-10-CM | POA: Diagnosis not present

## 2023-06-28 DIAGNOSIS — Z604 Social exclusion and rejection: Secondary | ICD-10-CM | POA: Diagnosis not present

## 2023-06-28 DIAGNOSIS — E66813 Obesity, class 3: Secondary | ICD-10-CM | POA: Diagnosis not present

## 2023-06-28 LAB — URINE CULTURE

## 2023-06-28 MED ORDER — POTASSIUM CHLORIDE CRYS ER 20 MEQ PO TBCR: 20.0000 meq | EXTENDED_RELEASE_TABLET | Freq: Every day | ORAL | 1 refills | Status: DC

## 2023-06-28 MED ORDER — BLOOD GLUCOSE TEST STRIPS 333 VI STRP: ORAL_STRIP | 3 refills | Status: DC

## 2023-06-28 NOTE — Progress Notes (Signed)
 Meds ordered this encounter  Medications   potassium chloride  SA (KLOR-CON  M) 20 MEQ tablet    Sig: Take 1 tablet (20 mEq total) by mouth daily.    Dispense:  90 tablet    Refill:  1   Glucose Blood (BLOOD GLUCOSE TEST STRIPS 333) STRP    Sig: Test strips to be used with glucometer.  Patient will need to verify what brand she currently has.  Okay to fill any current brand to be testing 1 times per day PRN. Dx IFG    Dispense:  50 strip    Refill:  3

## 2023-06-29 DIAGNOSIS — E1165 Type 2 diabetes mellitus with hyperglycemia: Secondary | ICD-10-CM | POA: Diagnosis not present

## 2023-06-29 DIAGNOSIS — H0589 Other disorders of orbit: Secondary | ICD-10-CM | POA: Diagnosis not present

## 2023-06-29 DIAGNOSIS — D649 Anemia, unspecified: Secondary | ICD-10-CM | POA: Diagnosis not present

## 2023-06-29 DIAGNOSIS — M170 Bilateral primary osteoarthritis of knee: Secondary | ICD-10-CM | POA: Diagnosis not present

## 2023-06-29 DIAGNOSIS — I110 Hypertensive heart disease with heart failure: Secondary | ICD-10-CM | POA: Diagnosis not present

## 2023-06-29 DIAGNOSIS — D3161 Benign neoplasm of unspecified site of right orbit: Secondary | ICD-10-CM | POA: Diagnosis not present

## 2023-06-29 DIAGNOSIS — F319 Bipolar disorder, unspecified: Secondary | ICD-10-CM | POA: Diagnosis not present

## 2023-06-29 DIAGNOSIS — Z7951 Long term (current) use of inhaled steroids: Secondary | ICD-10-CM | POA: Diagnosis not present

## 2023-06-29 DIAGNOSIS — M5030 Other cervical disc degeneration, unspecified cervical region: Secondary | ICD-10-CM | POA: Diagnosis not present

## 2023-06-29 DIAGNOSIS — J4520 Mild intermittent asthma, uncomplicated: Secondary | ICD-10-CM | POA: Diagnosis not present

## 2023-06-29 DIAGNOSIS — Z556 Problems related to health literacy: Secondary | ICD-10-CM | POA: Diagnosis not present

## 2023-06-29 DIAGNOSIS — G4733 Obstructive sleep apnea (adult) (pediatric): Secondary | ICD-10-CM | POA: Diagnosis not present

## 2023-06-29 DIAGNOSIS — N3281 Overactive bladder: Secondary | ICD-10-CM | POA: Diagnosis not present

## 2023-06-29 DIAGNOSIS — I5032 Chronic diastolic (congestive) heart failure: Secondary | ICD-10-CM | POA: Diagnosis not present

## 2023-06-29 DIAGNOSIS — Z8616 Personal history of COVID-19: Secondary | ICD-10-CM | POA: Diagnosis not present

## 2023-06-29 DIAGNOSIS — Z604 Social exclusion and rejection: Secondary | ICD-10-CM | POA: Diagnosis not present

## 2023-06-29 DIAGNOSIS — F411 Generalized anxiety disorder: Secondary | ICD-10-CM | POA: Diagnosis not present

## 2023-06-29 DIAGNOSIS — G43009 Migraine without aura, not intractable, without status migrainosus: Secondary | ICD-10-CM | POA: Diagnosis not present

## 2023-06-29 DIAGNOSIS — D6869 Other thrombophilia: Secondary | ICD-10-CM | POA: Diagnosis not present

## 2023-06-29 DIAGNOSIS — Z6841 Body Mass Index (BMI) 40.0 and over, adult: Secondary | ICD-10-CM | POA: Diagnosis not present

## 2023-06-29 DIAGNOSIS — K219 Gastro-esophageal reflux disease without esophagitis: Secondary | ICD-10-CM | POA: Diagnosis not present

## 2023-06-29 DIAGNOSIS — G2581 Restless legs syndrome: Secondary | ICD-10-CM | POA: Diagnosis not present

## 2023-06-29 DIAGNOSIS — I480 Paroxysmal atrial fibrillation: Secondary | ICD-10-CM | POA: Diagnosis not present

## 2023-06-29 DIAGNOSIS — E66813 Obesity, class 3: Secondary | ICD-10-CM | POA: Diagnosis not present

## 2023-06-29 DIAGNOSIS — E7800 Pure hypercholesterolemia, unspecified: Secondary | ICD-10-CM | POA: Diagnosis not present

## 2023-06-30 ENCOUNTER — Other Ambulatory Visit: Payer: Self-pay | Admitting: Family Medicine

## 2023-06-30 ENCOUNTER — Telehealth: Payer: Self-pay | Admitting: Family Medicine

## 2023-06-30 NOTE — Telephone Encounter (Signed)
 Ok to move out 4 week from now

## 2023-06-30 NOTE — Telephone Encounter (Signed)
 Copied from CRM [id number]. Topic: Appointments - Appointment Info/Confirmation >> Jun 30, 2023 10:30 AM Donna Sexton wrote: Patient would like to know if it is necessary for her to come in on June 26.   Pleased advise.

## 2023-07-03 DIAGNOSIS — Z556 Problems related to health literacy: Secondary | ICD-10-CM | POA: Diagnosis not present

## 2023-07-03 DIAGNOSIS — M5030 Other cervical disc degeneration, unspecified cervical region: Secondary | ICD-10-CM | POA: Diagnosis not present

## 2023-07-03 DIAGNOSIS — I110 Hypertensive heart disease with heart failure: Secondary | ICD-10-CM | POA: Diagnosis not present

## 2023-07-03 DIAGNOSIS — E1165 Type 2 diabetes mellitus with hyperglycemia: Secondary | ICD-10-CM | POA: Diagnosis not present

## 2023-07-03 DIAGNOSIS — E66813 Obesity, class 3: Secondary | ICD-10-CM | POA: Diagnosis not present

## 2023-07-03 DIAGNOSIS — N3281 Overactive bladder: Secondary | ICD-10-CM | POA: Diagnosis not present

## 2023-07-03 DIAGNOSIS — H0589 Other disorders of orbit: Secondary | ICD-10-CM | POA: Diagnosis not present

## 2023-07-03 DIAGNOSIS — G2581 Restless legs syndrome: Secondary | ICD-10-CM | POA: Diagnosis not present

## 2023-07-03 DIAGNOSIS — Z7951 Long term (current) use of inhaled steroids: Secondary | ICD-10-CM | POA: Diagnosis not present

## 2023-07-03 DIAGNOSIS — K219 Gastro-esophageal reflux disease without esophagitis: Secondary | ICD-10-CM | POA: Diagnosis not present

## 2023-07-03 DIAGNOSIS — I5032 Chronic diastolic (congestive) heart failure: Secondary | ICD-10-CM | POA: Diagnosis not present

## 2023-07-03 DIAGNOSIS — G43009 Migraine without aura, not intractable, without status migrainosus: Secondary | ICD-10-CM | POA: Diagnosis not present

## 2023-07-03 DIAGNOSIS — D3161 Benign neoplasm of unspecified site of right orbit: Secondary | ICD-10-CM | POA: Diagnosis not present

## 2023-07-03 DIAGNOSIS — F319 Bipolar disorder, unspecified: Secondary | ICD-10-CM | POA: Diagnosis not present

## 2023-07-03 DIAGNOSIS — J4520 Mild intermittent asthma, uncomplicated: Secondary | ICD-10-CM | POA: Diagnosis not present

## 2023-07-03 DIAGNOSIS — I480 Paroxysmal atrial fibrillation: Secondary | ICD-10-CM | POA: Diagnosis not present

## 2023-07-03 DIAGNOSIS — G4733 Obstructive sleep apnea (adult) (pediatric): Secondary | ICD-10-CM | POA: Diagnosis not present

## 2023-07-03 DIAGNOSIS — Z8616 Personal history of COVID-19: Secondary | ICD-10-CM | POA: Diagnosis not present

## 2023-07-03 DIAGNOSIS — F411 Generalized anxiety disorder: Secondary | ICD-10-CM | POA: Diagnosis not present

## 2023-07-03 DIAGNOSIS — D6869 Other thrombophilia: Secondary | ICD-10-CM | POA: Diagnosis not present

## 2023-07-03 DIAGNOSIS — E7800 Pure hypercholesterolemia, unspecified: Secondary | ICD-10-CM | POA: Diagnosis not present

## 2023-07-03 DIAGNOSIS — D649 Anemia, unspecified: Secondary | ICD-10-CM | POA: Diagnosis not present

## 2023-07-03 DIAGNOSIS — M170 Bilateral primary osteoarthritis of knee: Secondary | ICD-10-CM | POA: Diagnosis not present

## 2023-07-03 DIAGNOSIS — Z6841 Body Mass Index (BMI) 40.0 and over, adult: Secondary | ICD-10-CM | POA: Diagnosis not present

## 2023-07-03 DIAGNOSIS — Z604 Social exclusion and rejection: Secondary | ICD-10-CM | POA: Diagnosis not present

## 2023-07-03 NOTE — Telephone Encounter (Signed)
 Please advise if you are okay refilling med or if you want cardiology to manage it.

## 2023-07-04 ENCOUNTER — Other Ambulatory Visit: Payer: Self-pay | Admitting: Family Medicine

## 2023-07-05 ENCOUNTER — Other Ambulatory Visit: Payer: Self-pay | Admitting: Family Medicine

## 2023-07-05 DIAGNOSIS — E1165 Type 2 diabetes mellitus with hyperglycemia: Secondary | ICD-10-CM | POA: Diagnosis not present

## 2023-07-05 DIAGNOSIS — J4520 Mild intermittent asthma, uncomplicated: Secondary | ICD-10-CM | POA: Diagnosis not present

## 2023-07-05 DIAGNOSIS — I480 Paroxysmal atrial fibrillation: Secondary | ICD-10-CM | POA: Diagnosis not present

## 2023-07-05 DIAGNOSIS — Z556 Problems related to health literacy: Secondary | ICD-10-CM | POA: Diagnosis not present

## 2023-07-05 DIAGNOSIS — Z7951 Long term (current) use of inhaled steroids: Secondary | ICD-10-CM | POA: Diagnosis not present

## 2023-07-05 DIAGNOSIS — I5032 Chronic diastolic (congestive) heart failure: Secondary | ICD-10-CM | POA: Diagnosis not present

## 2023-07-05 DIAGNOSIS — G4733 Obstructive sleep apnea (adult) (pediatric): Secondary | ICD-10-CM | POA: Diagnosis not present

## 2023-07-05 DIAGNOSIS — F411 Generalized anxiety disorder: Secondary | ICD-10-CM | POA: Diagnosis not present

## 2023-07-05 DIAGNOSIS — F319 Bipolar disorder, unspecified: Secondary | ICD-10-CM | POA: Diagnosis not present

## 2023-07-05 DIAGNOSIS — E66813 Obesity, class 3: Secondary | ICD-10-CM | POA: Diagnosis not present

## 2023-07-05 DIAGNOSIS — D3161 Benign neoplasm of unspecified site of right orbit: Secondary | ICD-10-CM | POA: Diagnosis not present

## 2023-07-05 DIAGNOSIS — M5030 Other cervical disc degeneration, unspecified cervical region: Secondary | ICD-10-CM | POA: Diagnosis not present

## 2023-07-05 DIAGNOSIS — I110 Hypertensive heart disease with heart failure: Secondary | ICD-10-CM | POA: Diagnosis not present

## 2023-07-05 DIAGNOSIS — N3281 Overactive bladder: Secondary | ICD-10-CM | POA: Diagnosis not present

## 2023-07-05 DIAGNOSIS — G2581 Restless legs syndrome: Secondary | ICD-10-CM | POA: Diagnosis not present

## 2023-07-05 DIAGNOSIS — D6869 Other thrombophilia: Secondary | ICD-10-CM | POA: Diagnosis not present

## 2023-07-05 DIAGNOSIS — Z8616 Personal history of COVID-19: Secondary | ICD-10-CM | POA: Diagnosis not present

## 2023-07-05 DIAGNOSIS — Z6841 Body Mass Index (BMI) 40.0 and over, adult: Secondary | ICD-10-CM | POA: Diagnosis not present

## 2023-07-05 DIAGNOSIS — H0589 Other disorders of orbit: Secondary | ICD-10-CM | POA: Diagnosis not present

## 2023-07-05 DIAGNOSIS — D649 Anemia, unspecified: Secondary | ICD-10-CM | POA: Diagnosis not present

## 2023-07-05 DIAGNOSIS — G43009 Migraine without aura, not intractable, without status migrainosus: Secondary | ICD-10-CM | POA: Diagnosis not present

## 2023-07-05 DIAGNOSIS — E7800 Pure hypercholesterolemia, unspecified: Secondary | ICD-10-CM | POA: Diagnosis not present

## 2023-07-05 DIAGNOSIS — K219 Gastro-esophageal reflux disease without esophagitis: Secondary | ICD-10-CM | POA: Diagnosis not present

## 2023-07-05 DIAGNOSIS — M170 Bilateral primary osteoarthritis of knee: Secondary | ICD-10-CM | POA: Diagnosis not present

## 2023-07-05 DIAGNOSIS — Z604 Social exclusion and rejection: Secondary | ICD-10-CM | POA: Diagnosis not present

## 2023-07-05 NOTE — Telephone Encounter (Signed)
 Copied from CRM [id number]. Topic: Clinical - Medication Refill >> Jul 05, 2023  3:43 PM Kevelyn M wrote: Medication: amiodarone  (PACERONE ) 200 MG tablet, the previous doctor was a a prior nursing home  Has the patient contacted their pharmacy? Yes (Agent: If no, request that the patient contact the pharmacy for the refill. If patient does not wish to contact the pharmacy document the reason why and proceed with request.) (Agent: If yes, when and what did the pharmacy advise?)  This is the patient's preferred pharmacy:  [facility] [place], [place] - [address] [place] [address] Phone: [phone] Fax: [phone]   Is this the correct pharmacy for this prescription? Yes If no, delete pharmacy and type the correct one.   Has the prescription been filled recently? No  Is the patient out of the medication? No  Has the patient been seen for an appointment in the last year OR does the patient have an upcoming appointment? Yes  Can we respond through MyChart? Yes  Agent: Please be advised that Rx refills may take up to 3 business days. We ask that you follow-up with your pharmacy.

## 2023-07-06 ENCOUNTER — Ambulatory Visit: Admitting: Family Medicine

## 2023-07-06 DIAGNOSIS — D649 Anemia, unspecified: Secondary | ICD-10-CM | POA: Diagnosis not present

## 2023-07-06 DIAGNOSIS — J4520 Mild intermittent asthma, uncomplicated: Secondary | ICD-10-CM | POA: Diagnosis not present

## 2023-07-06 DIAGNOSIS — F411 Generalized anxiety disorder: Secondary | ICD-10-CM | POA: Diagnosis not present

## 2023-07-06 DIAGNOSIS — E7800 Pure hypercholesterolemia, unspecified: Secondary | ICD-10-CM | POA: Diagnosis not present

## 2023-07-06 DIAGNOSIS — G4733 Obstructive sleep apnea (adult) (pediatric): Secondary | ICD-10-CM | POA: Diagnosis not present

## 2023-07-06 DIAGNOSIS — K219 Gastro-esophageal reflux disease without esophagitis: Secondary | ICD-10-CM | POA: Diagnosis not present

## 2023-07-06 DIAGNOSIS — I5032 Chronic diastolic (congestive) heart failure: Secondary | ICD-10-CM | POA: Diagnosis not present

## 2023-07-06 DIAGNOSIS — G2581 Restless legs syndrome: Secondary | ICD-10-CM | POA: Diagnosis not present

## 2023-07-06 DIAGNOSIS — Z8616 Personal history of COVID-19: Secondary | ICD-10-CM | POA: Diagnosis not present

## 2023-07-06 DIAGNOSIS — I480 Paroxysmal atrial fibrillation: Secondary | ICD-10-CM | POA: Diagnosis not present

## 2023-07-06 DIAGNOSIS — G43009 Migraine without aura, not intractable, without status migrainosus: Secondary | ICD-10-CM | POA: Diagnosis not present

## 2023-07-06 DIAGNOSIS — Z604 Social exclusion and rejection: Secondary | ICD-10-CM | POA: Diagnosis not present

## 2023-07-06 DIAGNOSIS — Z7951 Long term (current) use of inhaled steroids: Secondary | ICD-10-CM | POA: Diagnosis not present

## 2023-07-06 DIAGNOSIS — E1165 Type 2 diabetes mellitus with hyperglycemia: Secondary | ICD-10-CM | POA: Diagnosis not present

## 2023-07-06 DIAGNOSIS — D6869 Other thrombophilia: Secondary | ICD-10-CM | POA: Diagnosis not present

## 2023-07-06 DIAGNOSIS — H0589 Other disorders of orbit: Secondary | ICD-10-CM | POA: Diagnosis not present

## 2023-07-06 DIAGNOSIS — M170 Bilateral primary osteoarthritis of knee: Secondary | ICD-10-CM | POA: Diagnosis not present

## 2023-07-06 DIAGNOSIS — E66813 Obesity, class 3: Secondary | ICD-10-CM | POA: Diagnosis not present

## 2023-07-06 DIAGNOSIS — Z6841 Body Mass Index (BMI) 40.0 and over, adult: Secondary | ICD-10-CM | POA: Diagnosis not present

## 2023-07-06 DIAGNOSIS — M5030 Other cervical disc degeneration, unspecified cervical region: Secondary | ICD-10-CM | POA: Diagnosis not present

## 2023-07-06 DIAGNOSIS — N3281 Overactive bladder: Secondary | ICD-10-CM | POA: Diagnosis not present

## 2023-07-06 DIAGNOSIS — F319 Bipolar disorder, unspecified: Secondary | ICD-10-CM | POA: Diagnosis not present

## 2023-07-06 DIAGNOSIS — I110 Hypertensive heart disease with heart failure: Secondary | ICD-10-CM | POA: Diagnosis not present

## 2023-07-06 DIAGNOSIS — Z556 Problems related to health literacy: Secondary | ICD-10-CM | POA: Diagnosis not present

## 2023-07-06 DIAGNOSIS — D3161 Benign neoplasm of unspecified site of right orbit: Secondary | ICD-10-CM | POA: Diagnosis not present

## 2023-07-06 NOTE — Telephone Encounter (Signed)
 Requesting rx rf of Amiodarone  200mg   Last written by  Dr. Nydia 03/11/2023 ( Noted in patient request that this was a previous nursing home provider )  Last OV 06/26/2023 Upcoming appt 07/19/2023

## 2023-07-07 ENCOUNTER — Other Ambulatory Visit (HOSPITAL_COMMUNITY): Payer: Self-pay | Admitting: *Deleted

## 2023-07-07 MED ORDER — AMIODARONE HCL 200 MG PO TABS: 200.0000 mg | ORAL_TABLET | Freq: Every day | ORAL | 6 refills | Status: DC

## 2023-07-07 MED ORDER — METOPROLOL TARTRATE 25 MG PO TABS: 25.0000 mg | ORAL_TABLET | Freq: Two times a day (BID) | ORAL | 3 refills | Status: DC

## 2023-07-07 NOTE — Telephone Encounter (Signed)
 Patient informed and will request the amiodarone  from cardiology.

## 2023-07-07 NOTE — Telephone Encounter (Signed)
 This refill request needs to be sent to her cardiologist.  Only the cardiologist write for amiodarone .

## 2023-07-12 DIAGNOSIS — Z556 Problems related to health literacy: Secondary | ICD-10-CM | POA: Diagnosis not present

## 2023-07-12 DIAGNOSIS — D6869 Other thrombophilia: Secondary | ICD-10-CM | POA: Diagnosis not present

## 2023-07-12 DIAGNOSIS — D649 Anemia, unspecified: Secondary | ICD-10-CM | POA: Diagnosis not present

## 2023-07-12 DIAGNOSIS — Z604 Social exclusion and rejection: Secondary | ICD-10-CM | POA: Diagnosis not present

## 2023-07-12 DIAGNOSIS — F411 Generalized anxiety disorder: Secondary | ICD-10-CM | POA: Diagnosis not present

## 2023-07-12 DIAGNOSIS — G4733 Obstructive sleep apnea (adult) (pediatric): Secondary | ICD-10-CM | POA: Diagnosis not present

## 2023-07-12 DIAGNOSIS — I480 Paroxysmal atrial fibrillation: Secondary | ICD-10-CM | POA: Diagnosis not present

## 2023-07-12 DIAGNOSIS — E7800 Pure hypercholesterolemia, unspecified: Secondary | ICD-10-CM | POA: Diagnosis not present

## 2023-07-12 DIAGNOSIS — I5032 Chronic diastolic (congestive) heart failure: Secondary | ICD-10-CM | POA: Diagnosis not present

## 2023-07-12 DIAGNOSIS — G43009 Migraine without aura, not intractable, without status migrainosus: Secondary | ICD-10-CM | POA: Diagnosis not present

## 2023-07-12 DIAGNOSIS — H0589 Other disorders of orbit: Secondary | ICD-10-CM | POA: Diagnosis not present

## 2023-07-12 DIAGNOSIS — J4520 Mild intermittent asthma, uncomplicated: Secondary | ICD-10-CM | POA: Diagnosis not present

## 2023-07-12 DIAGNOSIS — G2581 Restless legs syndrome: Secondary | ICD-10-CM | POA: Diagnosis not present

## 2023-07-12 DIAGNOSIS — I110 Hypertensive heart disease with heart failure: Secondary | ICD-10-CM | POA: Diagnosis not present

## 2023-07-12 DIAGNOSIS — E66813 Obesity, class 3: Secondary | ICD-10-CM | POA: Diagnosis not present

## 2023-07-12 DIAGNOSIS — D3161 Benign neoplasm of unspecified site of right orbit: Secondary | ICD-10-CM | POA: Diagnosis not present

## 2023-07-12 DIAGNOSIS — E1165 Type 2 diabetes mellitus with hyperglycemia: Secondary | ICD-10-CM | POA: Diagnosis not present

## 2023-07-12 DIAGNOSIS — Z6841 Body Mass Index (BMI) 40.0 and over, adult: Secondary | ICD-10-CM | POA: Diagnosis not present

## 2023-07-12 DIAGNOSIS — M170 Bilateral primary osteoarthritis of knee: Secondary | ICD-10-CM | POA: Diagnosis not present

## 2023-07-12 DIAGNOSIS — M5030 Other cervical disc degeneration, unspecified cervical region: Secondary | ICD-10-CM | POA: Diagnosis not present

## 2023-07-12 DIAGNOSIS — F319 Bipolar disorder, unspecified: Secondary | ICD-10-CM | POA: Diagnosis not present

## 2023-07-12 DIAGNOSIS — Z8616 Personal history of COVID-19: Secondary | ICD-10-CM | POA: Diagnosis not present

## 2023-07-12 DIAGNOSIS — Z7951 Long term (current) use of inhaled steroids: Secondary | ICD-10-CM | POA: Diagnosis not present

## 2023-07-12 DIAGNOSIS — K219 Gastro-esophageal reflux disease without esophagitis: Secondary | ICD-10-CM | POA: Diagnosis not present

## 2023-07-12 DIAGNOSIS — N3281 Overactive bladder: Secondary | ICD-10-CM | POA: Diagnosis not present

## 2023-07-13 DIAGNOSIS — D649 Anemia, unspecified: Secondary | ICD-10-CM | POA: Diagnosis not present

## 2023-07-13 DIAGNOSIS — J4520 Mild intermittent asthma, uncomplicated: Secondary | ICD-10-CM | POA: Diagnosis not present

## 2023-07-13 DIAGNOSIS — M170 Bilateral primary osteoarthritis of knee: Secondary | ICD-10-CM | POA: Diagnosis not present

## 2023-07-13 DIAGNOSIS — Z604 Social exclusion and rejection: Secondary | ICD-10-CM | POA: Diagnosis not present

## 2023-07-13 DIAGNOSIS — K219 Gastro-esophageal reflux disease without esophagitis: Secondary | ICD-10-CM | POA: Diagnosis not present

## 2023-07-13 DIAGNOSIS — G43009 Migraine without aura, not intractable, without status migrainosus: Secondary | ICD-10-CM | POA: Diagnosis not present

## 2023-07-13 DIAGNOSIS — H0589 Other disorders of orbit: Secondary | ICD-10-CM | POA: Diagnosis not present

## 2023-07-13 DIAGNOSIS — Z556 Problems related to health literacy: Secondary | ICD-10-CM | POA: Diagnosis not present

## 2023-07-13 DIAGNOSIS — G4733 Obstructive sleep apnea (adult) (pediatric): Secondary | ICD-10-CM | POA: Diagnosis not present

## 2023-07-13 DIAGNOSIS — E7800 Pure hypercholesterolemia, unspecified: Secondary | ICD-10-CM | POA: Diagnosis not present

## 2023-07-13 DIAGNOSIS — Z7951 Long term (current) use of inhaled steroids: Secondary | ICD-10-CM | POA: Diagnosis not present

## 2023-07-13 DIAGNOSIS — D6869 Other thrombophilia: Secondary | ICD-10-CM | POA: Diagnosis not present

## 2023-07-13 DIAGNOSIS — E1165 Type 2 diabetes mellitus with hyperglycemia: Secondary | ICD-10-CM | POA: Diagnosis not present

## 2023-07-13 DIAGNOSIS — F411 Generalized anxiety disorder: Secondary | ICD-10-CM | POA: Diagnosis not present

## 2023-07-13 DIAGNOSIS — I5032 Chronic diastolic (congestive) heart failure: Secondary | ICD-10-CM | POA: Diagnosis not present

## 2023-07-13 DIAGNOSIS — F319 Bipolar disorder, unspecified: Secondary | ICD-10-CM | POA: Diagnosis not present

## 2023-07-13 DIAGNOSIS — E66813 Obesity, class 3: Secondary | ICD-10-CM | POA: Diagnosis not present

## 2023-07-13 DIAGNOSIS — I480 Paroxysmal atrial fibrillation: Secondary | ICD-10-CM | POA: Diagnosis not present

## 2023-07-13 DIAGNOSIS — Z8616 Personal history of COVID-19: Secondary | ICD-10-CM | POA: Diagnosis not present

## 2023-07-13 DIAGNOSIS — D3161 Benign neoplasm of unspecified site of right orbit: Secondary | ICD-10-CM | POA: Diagnosis not present

## 2023-07-13 DIAGNOSIS — N3281 Overactive bladder: Secondary | ICD-10-CM | POA: Diagnosis not present

## 2023-07-13 DIAGNOSIS — M5030 Other cervical disc degeneration, unspecified cervical region: Secondary | ICD-10-CM | POA: Diagnosis not present

## 2023-07-13 DIAGNOSIS — Z6841 Body Mass Index (BMI) 40.0 and over, adult: Secondary | ICD-10-CM | POA: Diagnosis not present

## 2023-07-13 DIAGNOSIS — I110 Hypertensive heart disease with heart failure: Secondary | ICD-10-CM | POA: Diagnosis not present

## 2023-07-13 DIAGNOSIS — G2581 Restless legs syndrome: Secondary | ICD-10-CM | POA: Diagnosis not present

## 2023-07-18 DIAGNOSIS — Z8616 Personal history of COVID-19: Secondary | ICD-10-CM | POA: Diagnosis not present

## 2023-07-18 DIAGNOSIS — J4520 Mild intermittent asthma, uncomplicated: Secondary | ICD-10-CM | POA: Diagnosis not present

## 2023-07-18 DIAGNOSIS — Z6841 Body Mass Index (BMI) 40.0 and over, adult: Secondary | ICD-10-CM | POA: Diagnosis not present

## 2023-07-18 DIAGNOSIS — H0589 Other disorders of orbit: Secondary | ICD-10-CM | POA: Diagnosis not present

## 2023-07-18 DIAGNOSIS — Z7951 Long term (current) use of inhaled steroids: Secondary | ICD-10-CM | POA: Diagnosis not present

## 2023-07-18 DIAGNOSIS — N3281 Overactive bladder: Secondary | ICD-10-CM | POA: Diagnosis not present

## 2023-07-18 DIAGNOSIS — M5030 Other cervical disc degeneration, unspecified cervical region: Secondary | ICD-10-CM | POA: Diagnosis not present

## 2023-07-18 DIAGNOSIS — D649 Anemia, unspecified: Secondary | ICD-10-CM | POA: Diagnosis not present

## 2023-07-18 DIAGNOSIS — Z604 Social exclusion and rejection: Secondary | ICD-10-CM | POA: Diagnosis not present

## 2023-07-18 DIAGNOSIS — G43009 Migraine without aura, not intractable, without status migrainosus: Secondary | ICD-10-CM | POA: Diagnosis not present

## 2023-07-18 DIAGNOSIS — E1165 Type 2 diabetes mellitus with hyperglycemia: Secondary | ICD-10-CM | POA: Diagnosis not present

## 2023-07-18 DIAGNOSIS — E66813 Obesity, class 3: Secondary | ICD-10-CM | POA: Diagnosis not present

## 2023-07-18 DIAGNOSIS — Z556 Problems related to health literacy: Secondary | ICD-10-CM | POA: Diagnosis not present

## 2023-07-18 DIAGNOSIS — D3161 Benign neoplasm of unspecified site of right orbit: Secondary | ICD-10-CM | POA: Diagnosis not present

## 2023-07-18 DIAGNOSIS — M170 Bilateral primary osteoarthritis of knee: Secondary | ICD-10-CM | POA: Diagnosis not present

## 2023-07-18 DIAGNOSIS — I480 Paroxysmal atrial fibrillation: Secondary | ICD-10-CM | POA: Diagnosis not present

## 2023-07-18 DIAGNOSIS — I110 Hypertensive heart disease with heart failure: Secondary | ICD-10-CM | POA: Diagnosis not present

## 2023-07-18 DIAGNOSIS — D6869 Other thrombophilia: Secondary | ICD-10-CM | POA: Diagnosis not present

## 2023-07-18 DIAGNOSIS — F411 Generalized anxiety disorder: Secondary | ICD-10-CM | POA: Diagnosis not present

## 2023-07-18 DIAGNOSIS — I5032 Chronic diastolic (congestive) heart failure: Secondary | ICD-10-CM | POA: Diagnosis not present

## 2023-07-18 DIAGNOSIS — E7800 Pure hypercholesterolemia, unspecified: Secondary | ICD-10-CM | POA: Diagnosis not present

## 2023-07-18 DIAGNOSIS — G4733 Obstructive sleep apnea (adult) (pediatric): Secondary | ICD-10-CM | POA: Diagnosis not present

## 2023-07-18 DIAGNOSIS — K219 Gastro-esophageal reflux disease without esophagitis: Secondary | ICD-10-CM | POA: Diagnosis not present

## 2023-07-18 DIAGNOSIS — F319 Bipolar disorder, unspecified: Secondary | ICD-10-CM | POA: Diagnosis not present

## 2023-07-18 DIAGNOSIS — G2581 Restless legs syndrome: Secondary | ICD-10-CM | POA: Diagnosis not present

## 2023-07-19 ENCOUNTER — Inpatient Hospital Stay (HOSPITAL_COMMUNITY)
Admission: EM | Admit: 2023-07-19 | Discharge: 2023-07-27 | DRG: 193 | Disposition: A | Discharge status: Skilled Nursing Facility | Attending: Internal Medicine | Admitting: Internal Medicine

## 2023-07-19 ENCOUNTER — Encounter (HOSPITAL_COMMUNITY): Payer: Self-pay

## 2023-07-19 ENCOUNTER — Telehealth: Payer: Self-pay | Admitting: *Deleted

## 2023-07-19 ENCOUNTER — Other Ambulatory Visit: Payer: Self-pay

## 2023-07-19 ENCOUNTER — Ambulatory Visit: Admitting: Family Medicine

## 2023-07-19 ENCOUNTER — Other Ambulatory Visit: Payer: Self-pay | Admitting: Family Medicine

## 2023-07-19 ENCOUNTER — Emergency Department (HOSPITAL_COMMUNITY)

## 2023-07-19 DIAGNOSIS — T50915A Adverse effect of multiple unspecified drugs, medicaments and biological substances, initial encounter: Secondary | ICD-10-CM | POA: Diagnosis present

## 2023-07-19 DIAGNOSIS — J9621 Acute and chronic respiratory failure with hypoxia: Secondary | ICD-10-CM | POA: Diagnosis not present

## 2023-07-19 DIAGNOSIS — W19XXXA Unspecified fall, initial encounter: Secondary | ICD-10-CM | POA: Diagnosis not present

## 2023-07-19 DIAGNOSIS — E785 Hyperlipidemia, unspecified: Secondary | ICD-10-CM | POA: Diagnosis not present

## 2023-07-19 DIAGNOSIS — G43009 Migraine without aura, not intractable, without status migrainosus: Secondary | ICD-10-CM | POA: Diagnosis present

## 2023-07-19 DIAGNOSIS — E66813 Obesity, class 3: Secondary | ICD-10-CM | POA: Diagnosis not present

## 2023-07-19 DIAGNOSIS — J181 Lobar pneumonia, unspecified organism: Secondary | ICD-10-CM | POA: Diagnosis not present

## 2023-07-19 DIAGNOSIS — G928 Other toxic encephalopathy: Secondary | ICD-10-CM | POA: Diagnosis present

## 2023-07-19 DIAGNOSIS — J189 Pneumonia, unspecified organism: Secondary | ICD-10-CM | POA: Diagnosis present

## 2023-07-19 DIAGNOSIS — Z1152 Encounter for screening for COVID-19: Secondary | ICD-10-CM

## 2023-07-19 DIAGNOSIS — I480 Paroxysmal atrial fibrillation: Secondary | ICD-10-CM | POA: Diagnosis present

## 2023-07-19 DIAGNOSIS — I110 Hypertensive heart disease with heart failure: Secondary | ICD-10-CM | POA: Diagnosis not present

## 2023-07-19 DIAGNOSIS — Z6841 Body Mass Index (BMI) 40.0 and over, adult: Secondary | ICD-10-CM

## 2023-07-19 DIAGNOSIS — N3281 Overactive bladder: Secondary | ICD-10-CM | POA: Diagnosis present

## 2023-07-19 DIAGNOSIS — R531 Weakness: Secondary | ICD-10-CM | POA: Diagnosis present

## 2023-07-19 DIAGNOSIS — M8580 Other specified disorders of bone density and structure, unspecified site: Secondary | ICD-10-CM | POA: Diagnosis present

## 2023-07-19 DIAGNOSIS — I10 Essential (primary) hypertension: Secondary | ICD-10-CM | POA: Diagnosis present

## 2023-07-19 DIAGNOSIS — Z23 Encounter for immunization: Secondary | ICD-10-CM | POA: Diagnosis not present

## 2023-07-19 DIAGNOSIS — R2689 Other abnormalities of gait and mobility: Secondary | ICD-10-CM | POA: Diagnosis not present

## 2023-07-19 DIAGNOSIS — U099 Post covid-19 condition, unspecified: Secondary | ICD-10-CM | POA: Diagnosis present

## 2023-07-19 DIAGNOSIS — Z818 Family history of other mental and behavioral disorders: Secondary | ICD-10-CM

## 2023-07-19 DIAGNOSIS — M47812 Spondylosis without myelopathy or radiculopathy, cervical region: Secondary | ICD-10-CM | POA: Diagnosis not present

## 2023-07-19 DIAGNOSIS — E860 Dehydration: Secondary | ICD-10-CM | POA: Diagnosis present

## 2023-07-19 DIAGNOSIS — I5032 Chronic diastolic (congestive) heart failure: Secondary | ICD-10-CM | POA: Diagnosis not present

## 2023-07-19 DIAGNOSIS — M533 Sacrococcygeal disorders, not elsewhere classified: Secondary | ICD-10-CM | POA: Diagnosis not present

## 2023-07-19 DIAGNOSIS — Z79899 Other long term (current) drug therapy: Secondary | ICD-10-CM

## 2023-07-19 DIAGNOSIS — G9341 Metabolic encephalopathy: Secondary | ICD-10-CM | POA: Diagnosis not present

## 2023-07-19 DIAGNOSIS — M47816 Spondylosis without myelopathy or radiculopathy, lumbar region: Secondary | ICD-10-CM | POA: Diagnosis not present

## 2023-07-19 DIAGNOSIS — Z7901 Long term (current) use of anticoagulants: Secondary | ICD-10-CM | POA: Diagnosis not present

## 2023-07-19 DIAGNOSIS — J45909 Unspecified asthma, uncomplicated: Secondary | ICD-10-CM | POA: Diagnosis present

## 2023-07-19 DIAGNOSIS — M5030 Other cervical disc degeneration, unspecified cervical region: Secondary | ICD-10-CM | POA: Diagnosis not present

## 2023-07-19 DIAGNOSIS — R4182 Altered mental status, unspecified: Secondary | ICD-10-CM | POA: Diagnosis not present

## 2023-07-19 DIAGNOSIS — Z8249 Family history of ischemic heart disease and other diseases of the circulatory system: Secondary | ICD-10-CM

## 2023-07-19 DIAGNOSIS — Z885 Allergy status to narcotic agent status: Secondary | ICD-10-CM | POA: Diagnosis not present

## 2023-07-19 DIAGNOSIS — Z833 Family history of diabetes mellitus: Secondary | ICD-10-CM

## 2023-07-19 DIAGNOSIS — Z7401 Bed confinement status: Secondary | ICD-10-CM | POA: Diagnosis not present

## 2023-07-19 DIAGNOSIS — F319 Bipolar disorder, unspecified: Secondary | ICD-10-CM | POA: Diagnosis not present

## 2023-07-19 DIAGNOSIS — Z7951 Long term (current) use of inhaled steroids: Secondary | ICD-10-CM

## 2023-07-19 DIAGNOSIS — M160 Bilateral primary osteoarthritis of hip: Secondary | ICD-10-CM | POA: Diagnosis not present

## 2023-07-19 DIAGNOSIS — N3000 Acute cystitis without hematuria: Secondary | ICD-10-CM | POA: Diagnosis present

## 2023-07-19 DIAGNOSIS — R7303 Prediabetes: Secondary | ICD-10-CM | POA: Diagnosis present

## 2023-07-19 DIAGNOSIS — K219 Gastro-esophageal reflux disease without esophagitis: Secondary | ICD-10-CM | POA: Diagnosis not present

## 2023-07-19 DIAGNOSIS — M6281 Muscle weakness (generalized): Secondary | ICD-10-CM | POA: Diagnosis not present

## 2023-07-19 DIAGNOSIS — R5381 Other malaise: Secondary | ICD-10-CM | POA: Diagnosis not present

## 2023-07-19 DIAGNOSIS — R0989 Other specified symptoms and signs involving the circulatory and respiratory systems: Secondary | ICD-10-CM | POA: Diagnosis not present

## 2023-07-19 DIAGNOSIS — S50311A Abrasion of right elbow, initial encounter: Secondary | ICD-10-CM | POA: Diagnosis present

## 2023-07-19 DIAGNOSIS — Z043 Encounter for examination and observation following other accident: Secondary | ICD-10-CM | POA: Diagnosis not present

## 2023-07-19 DIAGNOSIS — R0602 Shortness of breath: Secondary | ICD-10-CM | POA: Diagnosis not present

## 2023-07-19 DIAGNOSIS — R1311 Dysphagia, oral phase: Secondary | ICD-10-CM | POA: Diagnosis not present

## 2023-07-19 DIAGNOSIS — F419 Anxiety disorder, unspecified: Secondary | ICD-10-CM | POA: Diagnosis present

## 2023-07-19 DIAGNOSIS — R918 Other nonspecific abnormal finding of lung field: Secondary | ICD-10-CM | POA: Diagnosis not present

## 2023-07-19 DIAGNOSIS — R41841 Cognitive communication deficit: Secondary | ICD-10-CM | POA: Diagnosis not present

## 2023-07-19 DIAGNOSIS — M25521 Pain in right elbow: Secondary | ICD-10-CM | POA: Diagnosis not present

## 2023-07-19 DIAGNOSIS — G319 Degenerative disease of nervous system, unspecified: Secondary | ICD-10-CM | POA: Diagnosis not present

## 2023-07-19 DIAGNOSIS — M4802 Spinal stenosis, cervical region: Secondary | ICD-10-CM | POA: Diagnosis not present

## 2023-07-19 DIAGNOSIS — N390 Urinary tract infection, site not specified: Secondary | ICD-10-CM | POA: Diagnosis not present

## 2023-07-19 DIAGNOSIS — M19021 Primary osteoarthritis, right elbow: Secondary | ICD-10-CM | POA: Diagnosis not present

## 2023-07-19 DIAGNOSIS — M129 Arthropathy, unspecified: Secondary | ICD-10-CM | POA: Diagnosis not present

## 2023-07-19 DIAGNOSIS — Z8261 Family history of arthritis: Secondary | ICD-10-CM

## 2023-07-19 DIAGNOSIS — W19XXXD Unspecified fall, subsequent encounter: Secondary | ICD-10-CM | POA: Diagnosis not present

## 2023-07-19 DIAGNOSIS — R102 Pelvic and perineal pain: Secondary | ICD-10-CM | POA: Diagnosis not present

## 2023-07-19 LAB — COMPREHENSIVE METABOLIC PANEL WITH GFR
ALT: 15 U/L (ref 0–44)
AST: 27 U/L (ref 15–41)
Albumin: 3.3 g/dL — ABNORMAL LOW (ref 3.5–5.0)
Alkaline Phosphatase: 57 U/L (ref 38–126)
Anion gap: 15 (ref 5–15)
BUN: 19 mg/dL (ref 8–23)
CO2: 28 mmol/L (ref 22–32)
Calcium: 9.1 mg/dL (ref 8.9–10.3)
Chloride: 95 mmol/L — ABNORMAL LOW (ref 98–111)
Creatinine, Ser: 1.04 mg/dL — ABNORMAL HIGH (ref 0.44–1.00)
GFR, Estimated: 56 mL/min — ABNORMAL LOW (ref >60)
Glucose, Bld: 112 mg/dL — ABNORMAL HIGH (ref 70–99)
Potassium: 3.3 mmol/L — ABNORMAL LOW (ref 3.5–5.1)
Sodium: 138 mmol/L (ref 135–145)
Total Bilirubin: 1.5 mg/dL — ABNORMAL HIGH (ref 0.0–1.2)
Total Protein: 6 g/dL — ABNORMAL LOW (ref 6.5–8.1)

## 2023-07-19 LAB — URINALYSIS, ROUTINE W REFLEX MICROSCOPIC
Bilirubin Urine: NEGATIVE
Glucose, UA: NEGATIVE mg/dL
Ketones, ur: NEGATIVE mg/dL
Nitrite: NEGATIVE
Protein, ur: NEGATIVE mg/dL
Specific Gravity, Urine: 1.009 (ref 1.005–1.030)
WBC, UA: >50 WBC/hpf (ref 0–5)
pH: 6 (ref 5.0–8.0)

## 2023-07-19 LAB — CBC WITH DIFFERENTIAL/PLATELET
Abs Immature Granulocytes: 0.14 K/uL — ABNORMAL HIGH (ref 0.00–0.07)
Basophils Absolute: 0.1 K/uL (ref 0.0–0.1)
Basophils Relative: 1 %
Eosinophils Absolute: 0.2 K/uL (ref 0.0–0.5)
Eosinophils Relative: 2 %
HCT: 34.7 % — ABNORMAL LOW (ref 36.0–46.0)
Hemoglobin: 11.5 g/dL — ABNORMAL LOW (ref 12.0–15.0)
Immature Granulocytes: 1 %
Lymphocytes Relative: 16 %
Lymphs Abs: 1.5 K/uL (ref 0.7–4.0)
MCH: 33 pg (ref 26.0–34.0)
MCHC: 33.1 g/dL (ref 30.0–36.0)
MCV: 99.4 fL (ref 80.0–100.0)
Monocytes Absolute: 1.6 K/uL — ABNORMAL HIGH (ref 0.1–1.0)
Monocytes Relative: 17 %
Neutro Abs: 6.1 K/uL (ref 1.7–7.7)
Neutrophils Relative %: 63 %
Platelets: 158 K/uL (ref 150–400)
RBC: 3.49 MIL/uL — ABNORMAL LOW (ref 3.87–5.11)
RDW: 13.2 % (ref 11.5–15.5)
WBC: 9.7 K/uL (ref 4.0–10.5)
nRBC: 0 % (ref 0.0–0.2)

## 2023-07-19 LAB — RESP PANEL BY RT-PCR (RSV, FLU A&B, COVID)  RVPGX2
Influenza A by PCR: NEGATIVE
Influenza B by PCR: NEGATIVE
Resp Syncytial Virus by PCR: NEGATIVE
SARS Coronavirus 2 by RT PCR: NEGATIVE

## 2023-07-19 LAB — TROPONIN I (HIGH SENSITIVITY)
Troponin I (High Sensitivity): 3 ng/L (ref <18)
Troponin I (High Sensitivity): 3 ng/L (ref <18)

## 2023-07-19 LAB — CBG MONITORING, ED: Glucose-Capillary: 81 mg/dL (ref 70–99)

## 2023-07-19 LAB — BRAIN NATRIURETIC PEPTIDE: B Natriuretic Peptide: 51.2 pg/mL (ref 0.0–100.0)

## 2023-07-19 LAB — CK: Total CK: 112 U/L (ref 38–234)

## 2023-07-19 MED ORDER — MELATONIN 3 MG PO TABS
3.0000 mg | ORAL_TABLET | Freq: Every evening | ORAL | Status: DC | PRN
  Administered 2023-07-22 – 2023-07-25 (×3): 3 mg via ORAL
  Filled 2023-07-19 (×4): qty 1

## 2023-07-19 MED ORDER — FUROSEMIDE 40 MG PO TABS
40.0000 mg | ORAL_TABLET | Freq: Every day | ORAL | Status: DC
  Administered 2023-07-20 – 2023-07-23 (×4): 40 mg via ORAL
  Filled 2023-07-19 (×5): qty 1

## 2023-07-19 MED ORDER — OXYCODONE HCL 5 MG PO TABS
2.5000 mg | ORAL_TABLET | ORAL | Status: DC | PRN
  Administered 2023-07-19 – 2023-07-22 (×8): 2.5 mg via ORAL
  Filled 2023-07-19 (×8): qty 1

## 2023-07-19 MED ORDER — POLYETHYLENE GLYCOL 3350 17 G PO PACK: 17.0000 g | PACK | Freq: Every day | ORAL | Status: DC | PRN

## 2023-07-19 MED ORDER — OXYBUTYNIN CHLORIDE 5 MG PO TABS
5.0000 mg | ORAL_TABLET | Freq: Two times a day (BID) | ORAL | Status: DC
  Administered 2023-07-20 – 2023-07-27 (×16): 5 mg via ORAL
  Filled 2023-07-19 (×16): qty 1

## 2023-07-19 MED ORDER — CEPHALEXIN 500 MG PO CAPS
500.0000 mg | ORAL_CAPSULE | Freq: Two times a day (BID) | ORAL | Status: DC
  Administered 2023-07-20 – 2023-07-21 (×3): 500 mg via ORAL
  Filled 2023-07-19 (×3): qty 1

## 2023-07-19 MED ORDER — CEPHALEXIN 500 MG PO CAPS: 500.0000 mg | ORAL_CAPSULE | Freq: Two times a day (BID) | ORAL | Status: DC

## 2023-07-19 MED ORDER — POTASSIUM CHLORIDE CRYS ER 20 MEQ PO TBCR
20.0000 meq | EXTENDED_RELEASE_TABLET | Freq: Every day | ORAL | Status: DC
  Administered 2023-07-20 – 2023-07-27 (×8): 20 meq via ORAL
  Filled 2023-07-19 (×8): qty 1

## 2023-07-19 MED ORDER — METOPROLOL TARTRATE 25 MG PO TABS
25.0000 mg | ORAL_TABLET | Freq: Two times a day (BID) | ORAL | Status: DC
  Administered 2023-07-20 – 2023-07-27 (×7): 25 mg via ORAL
  Filled 2023-07-19 (×9): qty 1

## 2023-07-19 MED ORDER — ATORVASTATIN CALCIUM 40 MG PO TABS
40.0000 mg | ORAL_TABLET | Freq: Every day | ORAL | Status: DC
  Administered 2023-07-20 – 2023-07-27 (×8): 40 mg via ORAL
  Filled 2023-07-19 (×8): qty 1

## 2023-07-19 MED ORDER — PREGABALIN 50 MG PO CAPS
50.0000 mg | ORAL_CAPSULE | Freq: Three times a day (TID) | ORAL | Status: DC
  Administered 2023-07-20 – 2023-07-23 (×12): 50 mg via ORAL
  Filled 2023-07-19 (×12): qty 1

## 2023-07-19 MED ORDER — TETANUS-DIPHTH-ACELL PERTUSSIS 5-2.5-18.5 LF-MCG/0.5 IM SUSY
0.5000 mL | PREFILLED_SYRINGE | Freq: Once | INTRAMUSCULAR | Status: AC
  Administered 2023-07-19: 0.5 mL via INTRAMUSCULAR
  Filled 2023-07-19: qty 0.5

## 2023-07-19 MED ORDER — POTASSIUM CHLORIDE CRYS ER 20 MEQ PO TBCR
40.0000 meq | EXTENDED_RELEASE_TABLET | ORAL | Status: AC
  Administered 2023-07-19: 40 meq via ORAL
  Filled 2023-07-19: qty 2

## 2023-07-19 MED ORDER — FAMOTIDINE 20 MG PO TABS
20.0000 mg | ORAL_TABLET | Freq: Every day | ORAL | Status: DC
  Administered 2023-07-20 – 2023-07-27 (×8): 20 mg via ORAL
  Filled 2023-07-19 (×7): qty 1

## 2023-07-19 MED ORDER — ALBUTEROL SULFATE (2.5 MG/3ML) 0.083% IN NEBU: 2.5000 mg | INHALATION_SOLUTION | RESPIRATORY_TRACT | Status: DC | PRN

## 2023-07-19 MED ORDER — SENNOSIDES-DOCUSATE SODIUM 8.6-50 MG PO TABS
2.0000 | ORAL_TABLET | Freq: Every day | ORAL | Status: DC
  Administered 2023-07-20 – 2023-07-23 (×5): 2 via ORAL
  Filled 2023-07-19 (×6): qty 2

## 2023-07-19 MED ORDER — FLUTICASONE FUROATE-VILANTEROL 100-25 MCG/ACT IN AEPB
1.0000 | INHALATION_SPRAY | Freq: Every day | RESPIRATORY_TRACT | Status: DC
  Administered 2023-07-22 – 2023-07-27 (×6): 1 via RESPIRATORY_TRACT
  Filled 2023-07-19: qty 28

## 2023-07-19 MED ORDER — APIXABAN 5 MG PO TABS
5.0000 mg | ORAL_TABLET | Freq: Two times a day (BID) | ORAL | Status: DC
  Administered 2023-07-20 – 2023-07-27 (×16): 5 mg via ORAL
  Filled 2023-07-19 (×3): qty 2
  Filled 2023-07-19 (×12): qty 1
  Filled 2023-07-19: qty 2

## 2023-07-19 MED ORDER — ACETAMINOPHEN 325 MG PO TABS
650.0000 mg | ORAL_TABLET | Freq: Four times a day (QID) | ORAL | Status: DC | PRN
  Administered 2023-07-20 – 2023-07-27 (×16): 650 mg via ORAL
  Filled 2023-07-19 (×16): qty 2

## 2023-07-19 MED ORDER — AMIODARONE HCL 200 MG PO TABS
200.0000 mg | ORAL_TABLET | Freq: Every day | ORAL | Status: DC
  Administered 2023-07-20 – 2023-07-27 (×5): 200 mg via ORAL
  Filled 2023-07-19 (×6): qty 1

## 2023-07-19 MED ORDER — ACETAMINOPHEN 500 MG PO TABS: 500.0000 mg | ORAL_TABLET | Freq: Four times a day (QID) | ORAL | Status: DC | PRN

## 2023-07-19 MED ORDER — SODIUM CHLORIDE 0.9 % IV SOLN
2.0000 g | Freq: Once | INTRAVENOUS | Status: AC
  Administered 2023-07-19: 2 g via INTRAVENOUS
  Filled 2023-07-19: qty 20

## 2023-07-19 MED ORDER — DIVALPROEX SODIUM ER 500 MG PO TB24
1500.0000 mg | ORAL_TABLET | Freq: Every day | ORAL | Status: DC
  Administered 2023-07-20 – 2023-07-26 (×8): 1500 mg via ORAL
  Filled 2023-07-19 (×8): qty 3

## 2023-07-19 MED ORDER — DILTIAZEM HCL ER COATED BEADS 180 MG PO CP24
180.0000 mg | ORAL_CAPSULE | Freq: Every day | ORAL | Status: DC
  Administered 2023-07-20 – 2023-07-27 (×5): 180 mg via ORAL
  Filled 2023-07-19 (×6): qty 1

## 2023-07-19 NOTE — ED Triage Notes (Signed)
 Pt BIB EMS from home for initially a fall. Pt has been falling more for the last two weeks. Family thought it was because she has not taken her shots yet for her knee pains, but she also has not been at her baseline. Pt is A&Ox4 but very weak. Pt did not hit her head and no LOC. Pt hurt her tailbone during the fall.   Cbg 134 116/66 70 HR

## 2023-07-19 NOTE — Telephone Encounter (Signed)
 Mr. Herling called to wanting to update Dr. Alvan about Donna Sexton and find out whether or not he should get her to the hospital.   He stated that she is unable to hold anything in her hands and 1 day ago she fell trying to get to the bathroom. She was checked out by EMS she had a small cut on her arm. No LOC or other injuries. She also fell again today while trying to get on the porta potty.    He said that she's coherent, no urinary sxs.   I advised him that I would forward his concerns to Dr. Alvan for advice and call back about recommendations. He was extremely grateful and thankful and will await call.

## 2023-07-19 NOTE — ED Provider Notes (Signed)
 [place] [facility] DEPARTMENT AT [facility] Provider Note   CSN: [id number] Arrival date & time: 07/19/23  1624     Patient presents with: [name]   Donna Sexton is a 73 y.o. female.   73 year old female with a history of hypertension, hyperlipidemia, heart failure with preserved ejection fraction, elevated BMI, and atrial fibrillation on Eliquis  who presents emergency department after a fall.  History obtained per patient and her ex-husband who lives with her.  She has been feeling weaker than usual over the past few days.  No specific infectious symptoms no chest pain or shortness of breath.  Did have a fall 2 days ago that was unwitnessed where she now has an abrasion on her right elbow.  Had another fall today where she landed on her tailbone and has been having some pain there.  No LOC.  Does not believe that she has had any head strike.  Does not believe that she had prolonged downtime.       Prior to Admission medications   Medication Sig Start Date End Date Taking? Authorizing Provider  acetaminophen  (TYLENOL ) 500 MG tablet Take 1 tablet (500 mg total) by mouth every 6 (six) hours as needed for mild pain (pain score 1-3) (or Fever >/= 101). Patient taking differently: Take 500 mg by mouth as needed for mild pain (pain score 1-3) (or Fever >/= 101). 03/10/23   Rai, Ripudeep K, MD  amiodarone  (PACERONE ) 200 MG tablet Take 1 tablet (200 mg total) by mouth daily. 07/07/23   Terra Fairy PARAS, PA-C  apixaban  (ELIQUIS ) 5 MG TABS tablet Take 1 tablet (5 mg total) by mouth 2 (two) times daily. 06/20/23   Alvan Dorothyann BIRCH, MD  atorvastatin  (LIPITOR) 40 MG tablet Take 1 tablet (40 mg total) by mouth daily. 05/22/23   Alvan Dorothyann BIRCH, MD  diltiazem  (CARDIZEM  CD) 180 MG 24 hr capsule Take 1 capsule (180 mg total) by mouth daily. 06/14/23   Alvan Dorothyann BIRCH, MD  divalproex  (DEPAKOTE  ER) 500 MG 24 hr tablet Take 3 tablets (1,500 mg total) by mouth at bedtime. 09/01/22    Cottle, Lorene KANDICE Raddle., MD  famotidine  (PEPCID ) 20 MG tablet Take 20 mg by mouth daily.    [provider]  furosemide  (LASIX ) 40 MG tablet Take 1 tablet (40 mg total) by mouth daily. 05/17/23   Rana Lum LITTIE, NP  Glucose Blood (BLOOD GLUCOSE TEST STRIPS 333) STRP Test strips to be used with glucometer.  Patient will need to verify what brand she currently has.  Okay to fill any current brand to be testing 1 times per day PRN. Dx IFG 06/28/23   Alvan Dorothyann BIRCH, MD  levalbuterol  (XOPENEX ) 0.63 MG/3ML nebulizer solution Take 3 mLs (0.63 mg total) by nebulization every 6 (six) hours as needed for wheezing or shortness of breath. 03/10/23   Rai, Ripudeep K, MD  melatonin 3 MG TABS tablet Take 1 tablet (3 mg total) by mouth at bedtime as needed. 04/19/23   Arlice Reichert, MD  metoprolol  tartrate (LOPRESSOR ) 25 MG tablet Take 1 tablet (25 mg total) by mouth 2 (two) times daily. 07/07/23 10/05/23  Terra Fairy PARAS, PA-C  mometasone -formoterol  (DULERA ) 100-5 MCG/ACT AERO Inhale 2 puffs into the lungs 2 (two) times daily. 03/10/23   Rai, Ripudeep MARLA, MD  oxybutynin  (DITROPAN ) 5 MG tablet Take 1 tablet (5 mg total) by mouth 2 (two) times daily. 05/23/23   Alvan Dorothyann BIRCH, MD  polyethylene glycol (MIRALAX  / GLYCOLAX ) 17 g  packet Take 17 g by mouth daily as needed for moderate constipation. 04/19/23   Arlice Reichert, MD  potassium chloride  SA (KLOR-CON  M) 20 MEQ tablet Take 1 tablet (20 mEq total) by mouth daily. 06/28/23   Alvan Dorothyann BIRCH, MD  pregabalin  (LYRICA ) 50 MG capsule Take 1 capsule (50 mg total) by mouth 3 (three) times daily. 12/30/22   Alvan Dorothyann BIRCH, MD  promethazine  (PHENERGAN ) 25 MG tablet TAKE ONE TABLET BY MOUTH EVERY 8 HOURS AS NEEDED FOR NAUSEA AND VOMITING 05/17/23   Alvan Dorothyann BIRCH, MD  senna-docusate (SENOKOT-S) 8.6-50 MG tablet Take 2 tablets by mouth at bedtime. 03/10/23   Davia Nydia POUR, MD    Allergies: Other and Morphine    Review of Systems  Updated Vital  Signs BP 107/61 (BP Location: Right Arm)   Pulse 66   Temp 98.1 F (36.7 C) (Oral)   Resp 16   SpO2 100%   Physical Exam Vitals and nursing note reviewed.  Constitutional:      General: She is not in acute distress.    Appearance: She is well-developed.  HENT:     Head: Normocephalic and atraumatic.     Right Ear: External ear normal.     Left Ear: External ear normal.     Nose: Nose normal.  Eyes:     Extraocular Movements: Extraocular movements intact.     Conjunctiva/sclera: Conjunctivae normal.     Pupils: Pupils are equal, round, and reactive to light.  Cardiovascular:     Rate and Rhythm: Normal rate and regular rhythm.     Heart sounds: No murmur heard. Pulmonary:     Effort: Pulmonary effort is normal. No respiratory distress.     Breath sounds: Normal breath sounds.  Abdominal:     General: Abdomen is flat. There is no distension.     Palpations: Abdomen is soft. There is no mass.     Tenderness: There is no abdominal tenderness. There is no guarding.  Musculoskeletal:     Cervical back: Normal range of motion and neck supple.     Right lower leg: Edema present.     Left lower leg: Edema present.     Comments: Small skin tear to right elbow.  Full range of motion of the right elbow.  No thoracic or lumbar spine tenderness to palpation  Skin:    General: Skin is warm and dry.  Neurological:     Mental Status: She is alert and oriented to person, place, and time. Mental status is at baseline.  Psychiatric:        Mood and Affect: Mood normal.     (all labs ordered are listed, but only abnormal results are displayed) Labs Reviewed  COMPREHENSIVE METABOLIC PANEL WITH GFR - Abnormal; Notable for the following components:      Result Value   Potassium 3.3 (*)    Chloride 95 (*)    Glucose, Bld 112 (*)    Creatinine, Ser 1.04 (*)    Total Protein 6.0 (*)    Albumin 3.3 (*)    Total Bilirubin 1.5 (*)    GFR, Estimated 56 (*)    All other components within  normal limits  CBC WITH DIFFERENTIAL/PLATELET - Abnormal; Notable for the following components:   RBC 3.49 (*)    Hemoglobin 11.5 (*)    HCT 34.7 (*)    Monocytes Absolute 1.6 (*)    Abs Immature Granulocytes 0.14 (*)    All other components within normal  limits  URINALYSIS, ROUTINE W REFLEX MICROSCOPIC - Abnormal; Notable for the following components:   APPearance HAZY (*)    Hgb urine dipstick MODERATE (*)    Leukocytes,Ua LARGE (*)    Bacteria, UA FEW (*)    Non Squamous Epithelial 0-5 (*)    All other components within normal limits  RESP PANEL BY RT-PCR (RSV, FLU A&B, COVID)  RVPGX2  CULTURE, BLOOD (ROUTINE X 2)  CULTURE, BLOOD (ROUTINE X 2)  URINE CULTURE  CK  BRAIN NATRIURETIC PEPTIDE  CBG MONITORING, ED  TROPONIN I (HIGH SENSITIVITY)  TROPONIN I (HIGH SENSITIVITY)    EKG: EKG Interpretation Date/Time:  Wednesday July 19 2023 17:51:09 EDT Ventricular Rate:  67 PR Interval:  162 QRS Duration:  88 QT Interval:  413 QTC Calculation: 436 R Axis:   49  Text Interpretation: Sinus rhythm Probable anterior infarct, age indeterminate Confirmed by Yolande Charleston [phone]) on 07/19/2023 8:26:59 PM  Radiology: CT CERVICAL SPINE WO CONTRAST Result Date: 07/19/2023 CLINICAL DATA:  Fall EXAM: CT CERVICAL SPINE WITHOUT CONTRAST TECHNIQUE: Multidetector CT imaging of the cervical spine was performed without intravenous contrast. Multiplanar CT image reconstructions were also generated. RADIATION DOSE REDUCTION: This exam was performed according to the departmental dose-optimization program which includes automated exposure control, adjustment of the mA and/or kV according to patient size and/or use of iterative reconstruction technique. COMPARISON:  03/01/2023 FINDINGS: Alignment: Slight degenerative anterolisthesis at C2-3, C3-4 and C4-5, stable since prior study. Skull base and vertebrae: No acute fracture. No primary bone lesion or focal pathologic process. Soft tissues and spinal  canal: No prevertebral fluid or swelling. No visible canal hematoma. Disc levels: Diffuse degenerative disc disease with disc space narrowing and spurring most pronounced in the lower cervical spine. Bilateral degenerative facet disease. No focal disc herniation. Upper chest: No acute findings Other: None IMPRESSION: Cervical spondylosis.  No acute bony abnormality. Electronically Signed   By: Franky Crease M.D.   On: 07/19/2023 20:26   CT HEAD WO CONTRAST Result Date: 07/19/2023 CLINICAL DATA:  Fall EXAM: CT HEAD WITHOUT CONTRAST TECHNIQUE: Contiguous axial images were obtained from the base of the skull through the vertex without intravenous contrast. RADIATION DOSE REDUCTION: This exam was performed according to the departmental dose-optimization program which includes automated exposure control, adjustment of the mA and/or kV according to patient size and/or use of iterative reconstruction technique. COMPARISON:  04/13/2023 FINDINGS: Brain: There is atrophy and chronic small vessel disease changes. No acute intracranial abnormality. Specifically, no hemorrhage, hydrocephalus, mass lesion, acute infarction, or significant intracranial injury. Vascular: No hyperdense vessel or unexpected calcification. Skull: No acute calvarial abnormality. Sinuses/Orbits: No acute findings Other: None IMPRESSION: No acute intracranial abnormality. Electronically Signed   By: Franky Crease M.D.   On: 07/19/2023 20:24   DG Pelvis 1-2 Views Result Date: 07/19/2023 CLINICAL DATA:  Pain after a fall. EXAM: PELVIS - 1-2 VIEW COMPARISON:  Sacrum 08/26/2020 FINDINGS: Degenerative changes in the lower lumbar spine and in both hips. No evidence of acute fracture or dislocation. No focal bone lesion or bone destruction. SI joints and symphysis pubis are not displaced. Visualized sacrum appears intact. Pelvic calcifications consistent with phleboliths. IMPRESSION: Degenerative changes in the lower lumbar spine and hips. No acute displaced  fractures identified. Electronically Signed   By: Elsie Gravely M.D.   On: 07/19/2023 20:09   DG Elbow Complete Right Result Date: 07/19/2023 CLINICAL DATA:  Right elbow pain after a fall. EXAM: RIGHT ELBOW - COMPLETE 3+ VIEW COMPARISON:  None  Available. FINDINGS: Degenerative changes in the elbow joint. Moderate spur formation on the coronary process. No evidence of acute fracture or dislocation. No focal bone lesion or bone destruction. Bone cortex appears intact. No significant effusions. Soft tissues are unremarkable. IMPRESSION: Degenerative changes in the right elbow. No acute fracture or dislocation. Electronically Signed   By: Elsie Gravely M.D.   On: 07/19/2023 20:08   DG Sacrum/Coccyx Result Date: 07/19/2023 CLINICAL DATA:  fall tailbone pain EXAM: SACRUM AND COCCYX - 2+ VIEW COMPARISON:  None Available. FINDINGS: There is no evidence of fracture or other focal bone lesions. IMPRESSION: Negative. Electronically Signed   By: Evalene Coho M.D.   On: 07/19/2023 20:07   DG Chest 2 View Result Date: 07/19/2023 CLINICAL DATA:  fall, weakness EXAM: CHEST - 2 VIEW COMPARISON:  AP radiograph chest dated April 13, 2023. FINDINGS: The heart size and mediastinal contours are within normal limits. Both lungs are clear. The visualized skeletal structures are unremarkable. IMPRESSION: No active cardiopulmonary disease. Electronically Signed   By: Evalene Coho M.D.   On: 07/19/2023 20:06     Procedures   Medications Ordered in the ED  acetaminophen  (TYLENOL ) tablet 650 mg (has no administration in time range)  oxyCODONE  (Oxy IR/ROXICODONE ) immediate release tablet 2.5 mg (2.5 mg Oral Given 07/19/23 2103)  cephALEXin  (KEFLEX ) capsule 500 mg (has no administration in time range)  Tdap (BOOSTRIX) injection 0.5 mL (0.5 mLs Intramuscular Given 07/19/23 1748)  cefTRIAXone  (ROCEPHIN ) 2 g in sodium chloride  0.9 % 100 mL IVPB (0 g Intravenous Stopped 07/19/23 2234)  potassium chloride  SA (KLOR-CON  M) CR  tablet 40 mEq (40 mEq Oral Given 07/19/23 2103)                                    Medical Decision Making Amount and/or Complexity of Data Reviewed Labs: ordered. Radiology: ordered.  Risk OTC drugs. Prescription drug management.   73 year old female with a history of hypertension, hyperlipidemia, heart failure with preserved ejection fraction, elevated BMI, and atrial fibrillation on Eliquis  who presents emergency department after a fall.   Initial Ddx:  Fall, TBI, C-spine injury, sacral fracture, hip fracture, UTI, pneumonia, MI, anemia  MDM/Course:  Patient dents to the emergency department with generalized weakness for the past few days.  Has had some falls as well.  She attributes this to her knees which have bad osteoarthritis.  Husband reports that she behaved similarly when she had a UTI in the past.  Says that she has not had any significant confusion though.  She underwent a broad workup to evaluate the cause of her weakness which included EKG and troponin, urinalysis, COVID and flu, and chest x-ray that showed that she may have a urinary tract infection at this time.  Started on ceftriaxone  based on her prior cultures.  She also had imaging of her head and neck and extremities where she was complaining of pain that did not show any acute fractures.  At this point in time do not feel that she meets criteria for admission but husband says that she has been to weak to go home and unsteady when walking around.  Placed in TOC border status and will have PT evaluate her.  Will continue Keflex  for her UTI   This patient presents to the ED for concern of complaints listed in HPI, this involves an extensive number of treatment options, and is a complaint that carries  with it a high risk of complications and morbidity. Disposition including potential need for admission considered.   Dispo: TOC boarder  Additional history obtained from ex-husband Records reviewed ED Visit Notes The  following labs were independently interpreted: Urinalysis and show urinary tract infection I independently reviewed the following imaging with scope of interpretation limited to determining acute life threatening conditions related to emergency care: Chest x-ray and agree with the radiologist interpretation with the following exceptions: none I personally reviewed and interpreted the pt's EKG: see above for interpretation  I have reviewed the patients home medications and made adjustments as needed Consults: TOC Social Determinants of health:  Geriatric  Portions of this note were generated with Scientist, clinical (histocompatibility and immunogenetics). Dictation errors may occur despite best attempts at proofreading.     Final diagnoses:  Fall, initial encounter  Acute cystitis without hematuria  Generalized weakness    ED Discharge Orders     None          Yolande Lamar BROCKS, MD 07/19/23 2345

## 2023-07-19 NOTE — Telephone Encounter (Signed)
 Yes I think she should go to [facility] hospital. I thought she was getting stronger so if she is falling that is concerning

## 2023-07-20 ENCOUNTER — Telehealth: Payer: Self-pay

## 2023-07-20 LAB — URINE CULTURE

## 2023-07-20 MED ORDER — LACTATED RINGERS IV BOLUS
1000.0000 mL | Freq: Once | INTRAVENOUS | Status: AC
  Administered 2023-07-20: 1000 mL via INTRAVENOUS

## 2023-07-20 NOTE — Telephone Encounter (Signed)
 Spoke with patient husband- just wanted to update Dr. Alvan  States that patient had fallen yesterday  Had become very weak and shaky over the past 2 to 3 days Was Dx with UTI Has been in ER since 5pm yesterday. He is not sure if she will be admitted Pt has come out and evaluated her and is recommending rehab at this point.  He does not have any help at home if the patient is released to home .  I suggested he ask  the hospital for a social worker to go out and evaluate there situation and see what options they have for assistance.

## 2023-07-20 NOTE — ED Provider Notes (Signed)
 Emergency Medicine Observation Re-evaluation Note  Donna Sexton is a 73 y.o. female, seen on rounds today.  Pt initially presented to the ED for complaints of Fall Currently, the patient is resting; no specific complaint   Physical Exam  BP (!) 107/59   Pulse 72   Temp 98.5 F (36.9 C)   Resp 13   SpO2 98%  Physical Exam General: nad Cardiac: RR Lungs: non-labored Psych: calm   ED Course / MDM  EKG:EKG Interpretation Date/Time:  Wednesday July 19 2023 17:51:09 EDT Ventricular Rate:  67 PR Interval:  162 QRS Duration:  88 QT Interval:  413 QTC Calculation: 436 R Axis:   49  Text Interpretation: Sinus rhythm Probable anterior infarct, age indeterminate Confirmed by Yolande Charleston [phone]) on 07/19/2023 8:26:59 PM  I have reviewed the labs performed to date as well as medications administered while in observation.  Recent changes in the last 24 hours include none.  Plan  Current plan is for PT evaluation for possible placement.    Neysa Caron PARAS, DO 07/20/23 [phone]

## 2023-07-20 NOTE — Progress Notes (Addendum)
 Auth pending for [employer]. Pt can admit over the weekend.   EJDDM:[id number] E

## 2023-07-20 NOTE — Telephone Encounter (Signed)
 Copied from CRM [id number]. Topic: Clinical - Medical Advice >> Jul 19, 2023  3:43 PM Adrianna P wrote: Reason for CRM: Patient ex husband called she had fall and  he has questions regarding her healthcare. [phone]   ----------------------------------------------------------------------- From previous Reason for Contact - Other: Reason for CRM:

## 2023-07-20 NOTE — Evaluation (Signed)
 Physical Therapy Evaluation Patient Details Name: Donna Sexton MRN: [id number] DOB: 12/21/1950 Today's Date: 07/20/2023  History of Present Illness  73 y.o. female admitted 07/19/23 after multiple falls, weakness.AMS, lethargy, weakness,  UTI. PMhx: morbid obesity, OSA, DM2, HTN, HLD, CHF, Afib, GERD, chronic anemia, migraine, bipolar disorder, arthritis, asthma  Clinical Impression  Pt admitted with above diagnosis.  Pt currently with functional limitations due to the deficits listed below (see PT Problem List). Pt will benefit from acute skilled PT to increase their independence and safety with mobility to allow discharge.     The patient reports 2 falls this week requiring EMS to assist up.  Patient ambulates with a RW and sleeps in a lift recliner at baseline, does have assistance for shower.  Patient evaluation limited by patient reports 10/10 knee pain, max assistance to roll and sit up onto side and return to supine . Patient is on a stretcher where her feet do not reach the floor, therefore, not safe to attempt standing.   Patient has been to rehab x 2  already this year and reports did well. Patient will benefit from continued inpatient follow up therapy, <3 hours/day         If plan is discharge home, recommend the following: Two people to help with walking and/or transfers;A lot of help with bathing/dressing/bathroom;Assistance with cooking/housework;Assist for transportation   Can travel by private vehicle   No    Equipment Recommendations None recommended by PT  Recommendations for Other Services       Functional Status Assessment Patient has had a recent decline in their functional status and demonstrates the ability to make significant improvements in function in a reasonable and predictable amount of time.     Precautions / Restrictions Precautions Precautions: Fall Restrictions Weight Bearing Restrictions Per Provider Order: No      Mobility  Bed  Mobility Overal bed mobility: Needs Assistance Bed Mobility: Rolling, Sidelying to Sit, Sit to Supine Rolling: Max assist, Used rails Sidelying to sit: Max assist   Sit to supine: Max assist   General bed mobility comments: patient reports bil knee pain limiting mobility.. Patient required max assistance to sit up onto side, did not fully get the right leg around and over edge with reports of pain.  Patient able to support self while sitting upright    Transfers                   General transfer comment: unable to attempt from high stretcher and patient not tolerating pressure on the legs    Ambulation/Gait                  Stairs            Wheelchair Mobility     Tilt Bed    Modified Rankin (Stroke Patients Only)       Balance Overall balance assessment: Needs assistance Sitting-balance support: Bilateral upper extremity supported, Feet unsupported Sitting balance-Leahy Scale: Fair                                       Pertinent Vitals/Pain Pain Assessment Pain Assessment: 0-10 Pain Score: 10-Worst pain ever Pain Location: both  knees and tailbone Pain Descriptors / Indicators: Aching, Guarding, Grimacing, Sharp Pain Intervention(s): Limited activity within patient's tolerance, Premedicated before session, Monitored during session, Repositioned    Home Living Family/patient expects to be  discharged to:: Private residence Living Arrangements: Alone Available Help at Discharge: Personal care attendant Type of Home: House Home Access: Level entry       Home Layout: Two level;Able to live on main level with bedroom/bathroom Home Equipment: Wheelchair - manual;Lift chair Additional Comments: uses RW mostly, sleeps in lift chair, ex husband has been helping her, has been to rehab x 2 since 2/25    Prior Function Prior Level of Function : Needs assist             Mobility Comments: uses RW in home ADLs Comments: assist  for bathing and dressing, using adult briefs since DC from SNF was mod I prior to Feb     Extremity/Trunk Assessment   Upper Extremity Assessment Upper Extremity Assessment: Generalized weakness;RUE deficits/detail;Right hand dominant;LUE deficits/detail RUE Deficits / Details: limited shld elevation, reports soreness LUE Deficits / Details: limited shoulder elevation    Lower Extremity Assessment Lower Extremity Assessment: RLE deficits/detail;LLE deficits/detail RLE Deficits / Details: tolerated ~ 45 * knee flex, limited by pain LLE Deficits / Details: tolerated 90 * knee flex when sitting    Cervical / Trunk Assessment Cervical / Trunk Assessment: Kyphotic  Communication   Communication Communication: No apparent difficulties    Cognition Arousal: Alert Behavior During Therapy: WFL for tasks assessed/performed   PT - Cognitive impairments: Orientation   Orientation impairments: Time                   PT - Cognition Comments: O to thurs and july Following commands: Intact       Cueing Cueing Techniques: Verbal cues     General Comments      Exercises     Assessment/Plan    PT Assessment Patient needs continued PT services  PT Problem List Decreased strength;Decreased knowledge of precautions;Decreased cognition;Decreased range of motion;Decreased mobility       PT Treatment Interventions DME instruction;Functional mobility training;Gait training;Therapeutic activities;Patient/family education;Therapeutic exercise    PT Goals (Current goals can be found in the Care Plan section)  Acute Rehab PT Goals Patient Stated Goal: walk, not fall PT Goal Formulation: With patient/family Time For Goal Achievement: 08/03/23 Potential to Achieve Goals: Fair    Frequency Min 2X/week     Co-evaluation               AM-PAC PT 6 Clicks Mobility  Outcome Measure Help needed turning from your back to your side while in a flat bed without using  bedrails?: Total Help needed moving from lying on your back to sitting on the side of a flat bed without using bedrails?: Total Help needed moving to and from a bed to a chair (including a wheelchair)?: Total Help needed standing up from a chair using your arms (e.g., wheelchair or bedside chair)?: Total Help needed to walk in hospital room?: Total Help needed climbing 3-5 steps with a railing? : Total 6 Click Score: 6    End of Session   Activity Tolerance: Patient limited by pain;Patient limited by fatigue Patient left: in bed;with family/visitor present;with call bell/phone within reach Nurse Communication: Mobility status;Need for lift equipment PT Visit Diagnosis: Unsteadiness on feet (R26.81);Repeated falls (R29.6);Difficulty in walking, not elsewhere classified (R26.2);Muscle weakness (generalized) (M62.81);History of falling (Z91.81);Pain Pain - Right/Left: Right Pain - part of body: Knee    Time: 9062-9041 PT Time Calculation (min) (ACUTE ONLY): 21 min   Charges:   PT Evaluation $PT Eval Low Complexity: 1 Low   PT General Charges $$  ACUTE PT VISIT: 1 Visit         Darice Potters PT Acute Rehabilitation Services Office [phone]   Potters Darice Norris 07/20/2023, 10:22 AM

## 2023-07-20 NOTE — Progress Notes (Signed)
 CSW presented bed offers to pt's son who requested CSW outreach to [facility] due to pt being there previously. CSW has outreached to [facility] to request review. TOC following.

## 2023-07-20 NOTE — Progress Notes (Signed)
 Pt faxed out- bed offers pending. CSW uploaded docs into [facility]. Awaiting PASRR.

## 2023-07-20 NOTE — Progress Notes (Signed)
 CSW received call from Home and [facility] [place]) rep who requested another PT eval with pt participating in therapy/off of stretcher. CSW requested PT see pt again via secure chat before tomorrow at 11AM. TOC following.

## 2023-07-20 NOTE — Discharge Instructions (Signed)
 Take the Keflex  for your urinary tract infection

## 2023-07-20 NOTE — Progress Notes (Signed)
Awaiting PT eval.  

## 2023-07-20 NOTE — Telephone Encounter (Signed)
 Spoke with patient husband - carey  Asked that he return our call as he is at the hospital at the time of the call.

## 2023-07-20 NOTE — Progress Notes (Addendum)
 CSW informed pt's son, Fonda, that patient is unable to be held for [facility] (bed unavailable until Monday) and Fonda stated you gotta know those other facilities are bottom of the barrel. CSW expressed understanding and informed Fonda that unfortunately the hospital is not affiliated with the facilities. Fonda proceeded to attempt to go back and forth regarding the facilities. CSW informed Fonda that the options are to return pt home with [facility] or another facility be chosen. Fonda inquired about whether he could be assigned another SW and CSW informed I am the only SW in the ED. Fonda continued to go back and forth and CSW reiterated options and informed Fonda she'd reach out to pending facilities and inform him of if anyone else is able to offer but did inform I needed to continue working on cases as conversation was not productive. TOC following.   Addend @ 1:28PM CSW presented [employer] to son. Son reports he will look through his files and reach out to some nurses he knows. CSW also encouraged reconsideration for [facility] but son states his dad is unable to get there. He thanked this Clinical research associate and states he will call back.   Addend @ 2:25PM CSW spoke with son, Fonda, who reports he has spoken with some folks and would like to accept [facility]. CSW notified Whitney. CSW to initiate auth.

## 2023-07-20 NOTE — Progress Notes (Signed)
 30 Day PASRR Note   Patient Details  Name: Donna Sexton Date of Birth: 05-01-1950   Transition of Care [facility]) CM/SW Contact:    Kari JONETTA Daisy, LCSW Phone Number: 07/20/2023, 10:41 AM  To Whom It May Concern:  Please be advised that this patient will require a short-term nursing home stay - anticipated 30 days or less for rehabilitation and strengthening.   The plan is for return home.

## 2023-07-20 NOTE — ED Notes (Signed)
 RN informed about BP readings

## 2023-07-20 NOTE — NC FL2 (Signed)
   MEDICAID FL2 LEVEL OF CARE FORM     IDENTIFICATION  Patient Name: Donna Sexton Birthdate: Nov 11, 1950 Sex: female Admission Date (Current Location): 07/19/2023  [facility] and [place] Number:  Producer, television/film/video and Address:  [facility],  [address], [place] [address]      Provider Number: [id number]  Attending Physician Name and Address:  Neysa Caron PARAS, DO  Relative Name and Phone Number:  Darryle Chew (Son)  [phone] [facility])    Current Level of Care: Hospital Recommended Level of Care: Skilled Nursing Facility Prior Approval Number:    Date Approved/Denied:   PASRR Number: PENDING  Discharge Plan: SNF    Current Diagnoses: Patient Active Problem List   Diagnosis Date Noted   Concern about memory 04/14/2023   Tremor 04/14/2023   Encounter for monitoring amiodarone  therapy 03/21/2023   Hypercoagulable state due to persistent atrial fibrillation (HCC) 03/21/2023   V-tach (HCC) 03/01/2023   Atrial fibrillation with rapid ventricular response (HCC) 03/01/2023   Senile purpura (HCC) 02/16/2023   Palpitations 02/04/2022   DDD (degenerative disc disease), cervical 02/05/2021   Chronic heart failure with preserved ejection fraction (HCC) 05/16/2020   Impaired glucose tolerance 10/10/2019   Morbid obesity (HCC) 10/09/2019   Acute pain of right shoulder 02/23/2018   GAD (generalized anxiety disorder) 12/20/2017   Primary osteoarthritis of both knees 01/23/2017   Localized, primary osteoarthritis of hand, left 01/23/2017   Lumbar radiculitis 09/07/2015   IBS (irritable bowel syndrome) 08/07/2015   Elevated LFTs 05/18/2015   Hyperglycemia 05/12/2015   Lumbago 11/05/2014   Basal cell papilloma 09/11/2014   Restless leg 04/16/2014   Muscle spasm 02/27/2014   Overactive bladder 04/09/2013   Abnormal mammogram 06/22/2012   Bipolar disorder (HCC) 09/29/2011   Hyperlipidemia 09/29/2011   Essential hypertension 09/29/2011   Telogen  effluvium 08/16/2011   Calcium  blood increased 01/11/2011   Clinical depression 10/04/2010   Anal bleeding 10/04/2010   Cannot sleep 10/04/2010   GERD 07/28/2009   COMMON MIGRAINE 12/29/2008   OSTEOPENIA 10/30/2007    Orientation RESPIRATION BLADDER Height & Weight     Self, Situation, Place  Normal Continent Weight:   Height:     BEHAVIORAL SYMPTOMS/MOOD NEUROLOGICAL BOWEL NUTRITION STATUS      Continent Diet (Regular)  AMBULATORY STATUS COMMUNICATION OF NEEDS Skin   Extensive Assist Verbally Normal                       Personal Care Assistance Level of Assistance  Bathing, Dressing, Feeding Bathing Assistance: Maximum assistance Feeding assistance: Limited assistance Dressing Assistance: Maximum assistance     Functional Limitations Info  Sight, Speech, Hearing Sight Info: Adequate Hearing Info: Adequate Speech Info: Adequate    SPECIAL CARE FACTORS FREQUENCY  PT (By licensed PT), OT (By licensed OT)     PT Frequency: x5/week OT Frequency: x5/week            Contractures Contractures Info: Not present    Additional Factors Info  Code Status, Allergies Code Status Info: Full Allergies Info: Morphine           Current Medications (07/20/2023):  This is the current hospital active medication list Current Facility-Administered Medications  Medication Dose Route Frequency Provider Last Rate Last Admin   acetaminophen  (TYLENOL ) tablet 500 mg  500 mg Oral Q6H PRN Paterson, Robert C, MD       acetaminophen  (TYLENOL ) tablet 650 mg  650 mg Oral Q6H PRN Yolande Lamar BROCKS,  MD   650 mg at 07/20/23 0835   albuterol  (PROVENTIL ) (2.5 MG/3ML) 0.083% nebulizer solution 2.5 mg  2.5 mg Nebulization Q4H PRN Paterson, Robert C, MD       amiodarone  (PACERONE ) tablet 200 mg  200 mg Oral Daily Yolande Lamar BROCKS, MD       apixaban  (ELIQUIS ) tablet 5 mg  5 mg Oral BID Yolande Lamar BROCKS, MD   5 mg at 07/20/23 0018   atorvastatin  (LIPITOR) tablet 40 mg  40 mg Oral Daily  Yolande Lamar BROCKS, MD       cephALEXin  (KEFLEX ) capsule 500 mg  500 mg Oral BID Paterson, Robert C, MD       diltiazem  (CARDIZEM  CD) 24 hr capsule 180 mg  180 mg Oral Daily Paterson, Robert C, MD       divalproex  (DEPAKOTE  ER) 24 hr tablet 1,500 mg  1,500 mg Oral QHS Paterson, Robert C, MD   1,500 mg at 07/20/23 0017   famotidine  (PEPCID ) tablet 20 mg  20 mg Oral Daily Yolande Lamar BROCKS, MD       fluticasone  furoate-vilanterol (BREO ELLIPTA ) 100-25 MCG/ACT 1 puff  1 puff Inhalation Daily Yolande Lamar BROCKS, MD       furosemide  (LASIX ) tablet 40 mg  40 mg Oral Daily Paterson, Robert C, MD       melatonin tablet 3 mg  3 mg Oral QHS PRN Paterson, Robert C, MD       metoprolol  tartrate (LOPRESSOR ) tablet 25 mg  25 mg Oral BID Yolande Lamar BROCKS, MD       oxybutynin  (DITROPAN ) tablet 5 mg  5 mg Oral BID Yolande Lamar BROCKS, MD   5 mg at 07/20/23 0018   oxyCODONE  (Oxy IR/ROXICODONE ) immediate release tablet 2.5 mg  2.5 mg Oral Q3H PRN Paterson, Robert C, MD   2.5 mg at 07/20/23 0021   polyethylene glycol (MIRALAX  / GLYCOLAX ) packet 17 g  17 g Oral Daily PRN Paterson, Robert C, MD       potassium chloride  SA (KLOR-CON  M) CR tablet 20 mEq  20 mEq Oral Daily Yolande Lamar BROCKS, MD       pregabalin  (LYRICA ) capsule 50 mg  50 mg Oral TID Paterson, Robert C, MD   50 mg at 07/20/23 0018   senna-docusate (Senokot-S) tablet 2 tablet  2 tablet Oral QHS Paterson, Robert C, MD   2 tablet at 07/20/23 0019   Current Outpatient Medications  Medication Sig Dispense Refill   acetaminophen  (TYLENOL ) 500 MG tablet Take 1 tablet (500 mg total) by mouth every 6 (six) hours as needed for mild pain (pain score 1-3) (or Fever >/= 101). (Patient taking differently: Take 500 mg by mouth every 8 (eight) hours as needed for mild pain (pain score 1-3), moderate pain (pain score 4-6), fever or headache (or Fever >/= 101).)     amiodarone  (PACERONE ) 200 MG tablet Take 1 tablet (200 mg total) by mouth daily. 30 tablet 6   amLODipine   (NORVASC ) 2.5 MG tablet Take 2.5 mg by mouth daily.     apixaban  (ELIQUIS ) 5 MG TABS tablet Take 1 tablet (5 mg total) by mouth 2 (two) times daily. 60 tablet 1   atorvastatin  (LIPITOR) 40 MG tablet Take 1 tablet (40 mg total) by mouth daily. 90 tablet 0   diltiazem  (CARDIZEM  CD) 180 MG 24 hr capsule Take 1 capsule (180 mg total) by mouth daily. 90 capsule 1   divalproex  (DEPAKOTE  ER) 500 MG 24 hr tablet Take 3  tablets (1,500 mg total) by mouth at bedtime. 270 tablet 1   famotidine  (PEPCID ) 20 MG tablet Take 20 mg by mouth daily.     furosemide  (LASIX ) 40 MG tablet Take 1 tablet (40 mg total) by mouth daily. 90 tablet 2   levalbuterol  (XOPENEX ) 0.63 MG/3ML nebulizer solution Take 3 mLs (0.63 mg total) by nebulization every 6 (six) hours as needed for wheezing or shortness of breath.     melatonin 3 MG TABS tablet Take 1 tablet (3 mg total) by mouth at bedtime as needed. (Patient taking differently: Take 3 mg by mouth at bedtime as needed (sleep).)     metoprolol  tartrate (LOPRESSOR ) 25 MG tablet Take 1 tablet (25 mg total) by mouth 2 (two) times daily. 60 tablet 3   mometasone -formoterol  (DULERA ) 100-5 MCG/ACT AERO Inhale 2 puffs into the lungs 2 (two) times daily.     oxybutynin  (DITROPAN ) 5 MG tablet Take 1 tablet (5 mg total) by mouth 2 (two) times daily. 180 tablet 1   polyethylene glycol (MIRALAX  / GLYCOLAX ) 17 g packet Take 17 g by mouth daily as needed for moderate constipation.     potassium chloride  SA (KLOR-CON  M) 20 MEQ tablet Take 1 tablet (20 mEq total) by mouth daily. 90 tablet 1   pregabalin  (LYRICA ) 50 MG capsule Take 1 capsule (50 mg total) by mouth 3 (three) times daily. 270 capsule 1   promethazine  (PHENERGAN ) 25 MG tablet TAKE ONE TABLET BY MOUTH EVERY 8 HOURS AS NEEDED FOR NAUSEA AND VOMITING (Patient taking differently: Take 25 mg by mouth every 8 (eight) hours as needed for vomiting, refractory nausea / vomiting or nausea.) 90 tablet 0   senna-docusate (SENOKOT-S) 8.6-50 MG  tablet Take 2 tablets by mouth at bedtime.     torsemide  (DEMADEX ) 100 MG tablet Take 50 mg by mouth daily.     Glucose Blood (BLOOD GLUCOSE TEST STRIPS 333) STRP Test strips to be used with glucometer.  Patient will need to verify what brand she currently has.  Okay to fill any current brand to be testing 1 times per day PRN. Dx IFG 50 strip 3     Discharge Medications: Please see discharge summary for a list of discharge medications.  Relevant Imaging Results:  Relevant Lab Results:   Additional Information SSN:[id number]  Kari JONETTA Daisy, LCSW

## 2023-07-21 ENCOUNTER — Emergency Department (HOSPITAL_COMMUNITY)

## 2023-07-21 DIAGNOSIS — Z79899 Other long term (current) drug therapy: Secondary | ICD-10-CM | POA: Diagnosis not present

## 2023-07-21 DIAGNOSIS — G928 Other toxic encephalopathy: Secondary | ICD-10-CM | POA: Diagnosis present

## 2023-07-21 DIAGNOSIS — E66813 Obesity, class 3: Secondary | ICD-10-CM | POA: Diagnosis present

## 2023-07-21 DIAGNOSIS — I5032 Chronic diastolic (congestive) heart failure: Secondary | ICD-10-CM | POA: Diagnosis present

## 2023-07-21 DIAGNOSIS — R7303 Prediabetes: Secondary | ICD-10-CM | POA: Diagnosis present

## 2023-07-21 DIAGNOSIS — M8580 Other specified disorders of bone density and structure, unspecified site: Secondary | ICD-10-CM | POA: Diagnosis present

## 2023-07-21 DIAGNOSIS — W19XXXA Unspecified fall, initial encounter: Secondary | ICD-10-CM | POA: Diagnosis present

## 2023-07-21 DIAGNOSIS — I480 Paroxysmal atrial fibrillation: Secondary | ICD-10-CM | POA: Diagnosis present

## 2023-07-21 DIAGNOSIS — Z885 Allergy status to narcotic agent status: Secondary | ICD-10-CM | POA: Diagnosis not present

## 2023-07-21 DIAGNOSIS — J9621 Acute and chronic respiratory failure with hypoxia: Secondary | ICD-10-CM | POA: Diagnosis not present

## 2023-07-21 DIAGNOSIS — W19XXXD Unspecified fall, subsequent encounter: Secondary | ICD-10-CM | POA: Diagnosis not present

## 2023-07-21 DIAGNOSIS — Z1152 Encounter for screening for COVID-19: Secondary | ICD-10-CM | POA: Diagnosis not present

## 2023-07-21 DIAGNOSIS — E860 Dehydration: Secondary | ICD-10-CM | POA: Diagnosis present

## 2023-07-21 DIAGNOSIS — J45909 Unspecified asthma, uncomplicated: Secondary | ICD-10-CM | POA: Diagnosis present

## 2023-07-21 DIAGNOSIS — Z23 Encounter for immunization: Secondary | ICD-10-CM | POA: Diagnosis present

## 2023-07-21 DIAGNOSIS — Z7951 Long term (current) use of inhaled steroids: Secondary | ICD-10-CM | POA: Diagnosis not present

## 2023-07-21 DIAGNOSIS — I10 Essential (primary) hypertension: Secondary | ICD-10-CM | POA: Diagnosis not present

## 2023-07-21 DIAGNOSIS — K219 Gastro-esophageal reflux disease without esophagitis: Secondary | ICD-10-CM | POA: Diagnosis present

## 2023-07-21 DIAGNOSIS — J189 Pneumonia, unspecified organism: Secondary | ICD-10-CM | POA: Diagnosis present

## 2023-07-21 DIAGNOSIS — Z8249 Family history of ischemic heart disease and other diseases of the circulatory system: Secondary | ICD-10-CM | POA: Diagnosis not present

## 2023-07-21 DIAGNOSIS — E785 Hyperlipidemia, unspecified: Secondary | ICD-10-CM | POA: Diagnosis present

## 2023-07-21 DIAGNOSIS — G9341 Metabolic encephalopathy: Secondary | ICD-10-CM | POA: Diagnosis not present

## 2023-07-21 DIAGNOSIS — R5381 Other malaise: Secondary | ICD-10-CM | POA: Diagnosis present

## 2023-07-21 DIAGNOSIS — Z7901 Long term (current) use of anticoagulants: Secondary | ICD-10-CM | POA: Diagnosis not present

## 2023-07-21 DIAGNOSIS — I110 Hypertensive heart disease with heart failure: Secondary | ICD-10-CM | POA: Diagnosis present

## 2023-07-21 DIAGNOSIS — Z6841 Body Mass Index (BMI) 40.0 and over, adult: Secondary | ICD-10-CM | POA: Diagnosis not present

## 2023-07-21 DIAGNOSIS — J181 Lobar pneumonia, unspecified organism: Secondary | ICD-10-CM | POA: Diagnosis present

## 2023-07-21 DIAGNOSIS — F319 Bipolar disorder, unspecified: Secondary | ICD-10-CM | POA: Diagnosis present

## 2023-07-21 DIAGNOSIS — N3000 Acute cystitis without hematuria: Secondary | ICD-10-CM | POA: Diagnosis present

## 2023-07-21 DIAGNOSIS — T50915A Adverse effect of multiple unspecified drugs, medicaments and biological substances, initial encounter: Secondary | ICD-10-CM | POA: Diagnosis present

## 2023-07-21 LAB — BASIC METABOLIC PANEL WITH GFR
Anion gap: 10 (ref 5–15)
BUN: 17 mg/dL (ref 8–23)
CO2: 30 mmol/L (ref 22–32)
Calcium: 8.8 mg/dL — ABNORMAL LOW (ref 8.9–10.3)
Chloride: 96 mmol/L — ABNORMAL LOW (ref 98–111)
Creatinine, Ser: 0.87 mg/dL (ref 0.44–1.00)
GFR, Estimated: >60 mL/min (ref >60)
Glucose, Bld: 99 mg/dL (ref 70–99)
Potassium: 3.9 mmol/L (ref 3.5–5.1)
Sodium: 136 mmol/L (ref 135–145)

## 2023-07-21 LAB — CBC WITH DIFFERENTIAL/PLATELET
Abs Immature Granulocytes: 0.09 K/uL — ABNORMAL HIGH (ref 0.00–0.07)
Basophils Absolute: 0 K/uL (ref 0.0–0.1)
Basophils Relative: 1 %
Eosinophils Absolute: 0.4 K/uL (ref 0.0–0.5)
Eosinophils Relative: 7 %
HCT: 30.3 % — ABNORMAL LOW (ref 36.0–46.0)
Hemoglobin: 10 g/dL — ABNORMAL LOW (ref 12.0–15.0)
Immature Granulocytes: 2 %
Lymphocytes Relative: 17 %
Lymphs Abs: 1 K/uL (ref 0.7–4.0)
MCH: 33.7 pg (ref 26.0–34.0)
MCHC: 33 g/dL (ref 30.0–36.0)
MCV: 102 fL — ABNORMAL HIGH (ref 80.0–100.0)
Monocytes Absolute: 1 K/uL (ref 0.1–1.0)
Monocytes Relative: 18 %
Neutro Abs: 3.3 K/uL (ref 1.7–7.7)
Neutrophils Relative %: 55 %
Platelets: 143 K/uL — ABNORMAL LOW (ref 150–400)
RBC: 2.97 MIL/uL — ABNORMAL LOW (ref 3.87–5.11)
RDW: 13.2 % (ref 11.5–15.5)
WBC: 5.8 K/uL (ref 4.0–10.5)
nRBC: 0 % (ref 0.0–0.2)

## 2023-07-21 LAB — BLOOD GAS, VENOUS
Acid-Base Excess: 6 mmol/L — ABNORMAL HIGH (ref 0.0–2.0)
Bicarbonate: 32.2 mmol/L — ABNORMAL HIGH (ref 20.0–28.0)
O2 Saturation: 94.8 %
Patient temperature: 37
pCO2, Ven: 52 mmHg (ref 44–60)
pH, Ven: 7.4 (ref 7.25–7.43)
pO2, Ven: 68 mmHg — ABNORMAL HIGH (ref 32–45)

## 2023-07-21 LAB — BRAIN NATRIURETIC PEPTIDE: B Natriuretic Peptide: 96.8 pg/mL (ref 0.0–100.0)

## 2023-07-21 MED ORDER — SODIUM CHLORIDE 0.9 % IV SOLN
2.0000 g | Freq: Once | INTRAVENOUS | Status: AC
  Administered 2023-07-21: 2 g via INTRAVENOUS
  Filled 2023-07-21: qty 20

## 2023-07-21 MED ORDER — ONDANSETRON HCL 4 MG/2ML IJ SOLN
4.0000 mg | Freq: Four times a day (QID) | INTRAMUSCULAR | Status: DC | PRN
  Administered 2023-07-27: 4 mg via INTRAVENOUS
  Filled 2023-07-21: qty 2

## 2023-07-21 MED ORDER — AMLODIPINE BESYLATE 5 MG PO TABS
2.5000 mg | ORAL_TABLET | Freq: Every day | ORAL | Status: DC
  Filled 2023-07-21: qty 1

## 2023-07-21 MED ORDER — KETOROLAC TROMETHAMINE 15 MG/ML IJ SOLN
15.0000 mg | Freq: Once | INTRAMUSCULAR | Status: AC
  Administered 2023-07-21: 15 mg via INTRAVENOUS
  Filled 2023-07-21: qty 1

## 2023-07-21 MED ORDER — SODIUM CHLORIDE 0.9 % IV SOLN
500.0000 mg | INTRAVENOUS | Status: DC
  Administered 2023-07-21 – 2023-07-22 (×2): 500 mg via INTRAVENOUS
  Filled 2023-07-21 (×2): qty 5

## 2023-07-21 MED ORDER — SODIUM CHLORIDE 0.9 % IV BOLUS
1000.0000 mL | Freq: Once | INTRAVENOUS | Status: AC
  Administered 2023-07-21: 1000 mL via INTRAVENOUS

## 2023-07-21 MED ORDER — SODIUM CHLORIDE 0.9 % IV SOLN
2.0000 g | INTRAVENOUS | Status: DC
  Administered 2023-07-22: 2 g via INTRAVENOUS
  Filled 2023-07-21: qty 20

## 2023-07-21 MED ORDER — IPRATROPIUM-ALBUTEROL 0.5-2.5 (3) MG/3ML IN SOLN
3.0000 mL | Freq: Four times a day (QID) | RESPIRATORY_TRACT | Status: DC
  Administered 2023-07-21 – 2023-07-22 (×2): 3 mL via RESPIRATORY_TRACT
  Filled 2023-07-21 (×2): qty 3

## 2023-07-21 MED ORDER — ONDANSETRON HCL 4 MG PO TABS: 4.0000 mg | ORAL_TABLET | Freq: Four times a day (QID) | ORAL | Status: DC | PRN

## 2023-07-21 NOTE — Progress Notes (Signed)
 Physical Therapy Treatment Patient Details Name: Donna Sexton MRN: [id number] DOB: November 07, 1950 Today's Date: 07/21/2023   History of Present Illness 73 y.o. female admitted 07/19/23 after multiple falls, weakness.AMS, lethargy, weakness,  UTI. PMhx: morbid obesity, OSA, DM2, HTN, HLD, CHF, Afib, GERD, chronic anemia, migraine, bipolar disorder, arthritis, asthma    PT Comments  Patient found supine, [place] out of b=nares, blood on face and both hands and blood clot was on the patient's finger. Appears from her nose. RN notified.  Patient  was lethargic  initially, no speaking. BP 87/45, patient lying supine, Noted SPO2 on 4 l dropping to 80%. Stimulated patient, she aroused , RR was rapid. SPo2 returned to >90% Patient assisted to reposition in bed with total assist of 2. Patient gradually aroused more and  was able to converse, speech /expression not clear,  not completing thoughts. Patient assisted to sitting up onto bed edge with total assist of 2. Patient sat  x ~ 8 minutes, BP 106/73, HR 69, Spo2 (5 % on 4 L. Attempted to stand without success, patient unable to power up with 2 persons. RN and MD in room. Patient   is not as alert and not as mobile , not moving  arms and legs as previous  encounter. Also noted much worse tremors in the  arms.  Patient will benefit from continued inpatient follow up therapy, <3 hours/day    If plan is discharge home, recommend the following: Two people to help with walking and/or transfers;A lot of help with bathing/dressing/bathroom;Assistance with cooking/housework;Assist for transportation   Can travel by private vehicle        Equipment Recommendations  None recommended by PT    Recommendations for Other Services       Precautions / Restrictions Precautions Precautions: Fall Precaution/Restrictions Comments: now on O2, BP soft Restrictions Weight Bearing Restrictions Per Provider Order: No     Mobility  Bed Mobility Overal bed mobility: Needs  Assistance Bed Mobility: Rolling, Sidelying to Sit, Sit to Supine Rolling: Total assist, +2 for safety/equipment, +2 for physical assistance Sidelying to sit: Total assist, +2 for safety/equipment, +2 for physical assistance, HOB elevated   Sit to supine: +2 for safety/equipment, +2 for physical assistance   General bed mobility comments: patient  not as  aroused when mobilizing as noted previous encounter yesterday. Patient  not able to assist with any movements    Transfers Overall transfer level: Needs assistance Equipment used: 2 person hand held assist Transfers: Sit to/from Stand             General transfer comment: attempted to stand from bed with use of bed pad to lift buttocks, unable to clear bed.    Ambulation/Gait                   Stairs             Wheelchair Mobility     Tilt Bed    Modified Rankin (Stroke Patients Only)       Balance Overall balance assessment: Needs assistance Sitting-balance support: Bilateral upper extremity supported, Feet supported Sitting balance-Leahy Scale: Poor Sitting balance - Comments: patient required max support   for balance, progressed to min support. Postural control: Posterior lean                                  Communication Communication Communication: Impaired Factors Affecting Communication: Reduced clarity of speech;Difficulty  expressing self (initially  problem speaking, improved  but difficulty finishing sentences)  Cognition Arousal: Lethargic Behavior During Therapy: Flat affect   PT - Cognitive impairments: Orientation, Initiation, Sequencing, Memory, Attention   Orientation impairments: Place, Time, Situation                   PT - Cognition Comments: patient did arouse when stimulated with rolling and repositioning. Patient did state that she had a fall and was on floor for several days before neighbors found her(unsure  if accurate). It is known that she fell  x 2 this week. Following commands: Impaired Following commands impaired: Follows one step commands inconsistently    Cueing Cueing Techniques: Verbal cues, Gestural cues, Tactile cues  Exercises      General Comments        Pertinent Vitals/Pain Pain Assessment Pain Assessment: PAINAD Breathing: occasional labored breathing, short period of hyperventilation Negative Vocalization: occasional moan/groan, low speech, negative/disapproving quality Facial Expression: sad, frightened, frown Body Language: tense, distressed pacing, fidgeting Consolability: distracted or reassured by voice/touch PAINAD Score: 5 Pain Location: both  knees and tailbone    Home Living                          Prior Function            PT Goals (current goals can now be found in the care plan section) Progress towards PT goals: Progressing toward goals    Frequency    Min 2X/week      PT Plan      Co-evaluation              AM-PAC PT 6 Clicks Mobility   Outcome Measure  Help needed turning from your back to your side while in a flat bed without using bedrails?: Total Help needed moving from lying on your back to sitting on the side of a flat bed without using bedrails?: Total Help needed moving to and from a bed to a chair (including a wheelchair)?: Total Help needed standing up from a chair using your arms (e.g., wheelchair or bedside chair)?: Total Help needed to walk in hospital room?: Total Help needed climbing 3-5 steps with a railing? : Total 6 Click Score: 6    End of Session   Activity Tolerance: Patient limited by fatigue;Patient limited by lethargy Patient left: in bed;with call bell/phone within reach;with bed alarm set Nurse Communication: Mobility status;Need for lift equipment PT Visit Diagnosis: Unsteadiness on feet (R26.81);Repeated falls (R29.6);Difficulty in walking, not elsewhere classified (R26.2);Muscle weakness (generalized) (M62.81);History of  falling (Z91.81);Pain Pain - Right/Left: Right Pain - part of body: Knee     Time: 1200-1232 PT Time Calculation (min) (ACUTE ONLY): 32 min  Charges:    $Therapeutic Activity: 23-37 mins PT General Charges $$ ACUTE PT VISIT: 1 Visit                     Darice Potters PT Acute Rehabilitation Services Office [phone]    Potters Darice Norris 07/21/2023, 12:50 PM

## 2023-07-21 NOTE — ED Notes (Signed)
 Patient resting in bed asleep at this time, even and unlabored respirations noted. Repositioned head on pillow. Call light within reach.

## 2023-07-21 NOTE — Telephone Encounter (Signed)
 I agree, if it is a safety issue for her to go home they should be able to connect with the social worker who might be able to get her into a rehab facility.

## 2023-07-21 NOTE — ED Notes (Signed)
Report given to Steve, RN

## 2023-07-21 NOTE — Plan of Care (Signed)

## 2023-07-21 NOTE — ED Provider Notes (Signed)
 Emergency Medicine Observation Re-evaluation Note  Donna Sexton is a 73 y.o. female, seen on rounds today.  Pt initially presented to the ED for complaints of Fall Currently, the patient is awaiting placement.  Physical Exam  BP 103/63   Pulse 71   Temp (!) 97.5 F (36.4 C) (Oral)   Resp 16   SpO2 100%  Physical Exam Alert and in no acute distress  ED Course / MDM  EKG:EKG Interpretation Date/Time:  Wednesday July 19 2023 17:51:09 EDT Ventricular Rate:  67 PR Interval:  162 QRS Duration:  88 QT Interval:  413 QTC Calculation: 436 R Axis:   49  Text Interpretation: Sinus rhythm Probable anterior infarct, age indeterminate Confirmed by Donna Sexton [phone]) on 07/19/2023 8:26:59 PM  I have reviewed the labs performed to date as well as medications administered while in observation.  Recent changes in the last 24 hours include none.  Plan  Current plan is for placement.    Suzette Pac, MD 07/21/23 [phone]

## 2023-07-21 NOTE — ED Provider Notes (Signed)
 Patient has become hypoxic since yesterday.  Her O2 sats on room air have been in the upper 80s.  She was worked up with a chest x-ray and repeat labs and she has left lower lobe pneumonia.  Patient will be admitted to medicine for hypoxia and pneumonia   Suzette Pac, MD 07/21/23 1428

## 2023-07-21 NOTE — Progress Notes (Signed)
 Auth pending. Awaiting updated PT eval.

## 2023-07-21 NOTE — ED Notes (Signed)
 Patient falls asleep easily verbal and tactile stimuli to wake up patient. Friend is at bedside.

## 2023-07-21 NOTE — Progress Notes (Addendum)
 CSW spoke with Madelin Kohler at [facility] to inform her that patient is being admitted. Tammy requested CSW upload new note to [place] to support insurance authorization - note was uploaded to existing insurance auth request.  If patient becomes medically stable over the weekend, please contact Whitney at central intake @ [phone].  Niels Portugal, MSW, LCSW Transitions of Care  Clinical Social Worker II [phone]

## 2023-07-21 NOTE — Telephone Encounter (Signed)
 Spoke w/Donna Sexton he stated that she is in the process of getting admitted and they are hoping to get her into Rehab.

## 2023-07-21 NOTE — ED Notes (Signed)
 New orders placed looking at possible admission

## 2023-07-21 NOTE — ED Notes (Signed)
 Sent following message to ER provider  Dr. suzette, I think patient needs a reevaluation today. She has been drowsy this morning and hasn't received pain meds since around 3am. PT states she saw her yesterday and this is not the same presentation that she had yesterday.

## 2023-07-21 NOTE — H&P (Signed)
 History and Physical    Patient: Donna Sexton FMW:[id number] DOB: [birth year] DOA: 07/19/2023 DOS: the patient was seen and examined on 07/21/2023 PCP: Alvan Dorothyann BIRCH, MD  Patient coming from: Home  Chief Complaint:  Chief Complaint  Patient presents with   Fall   HPI: Donna Sexton is a 73 y.o. female with medical history significant of respiratory failure due to COVID-19, anemia, anxiety, osteoarthritis, asthma, bipolar disorder, CHF,, migraine, encephalopathy, GERD, hyperlipidemia, hypertension, osteopenia, overactive bladder who presented to the emergency department with a fall with benign workup and was awaiting placement for SNF but subsequently became hypoxic mildly dyspneic.  Imaging revealed left lower lobe infiltrate. She denied fever, chills, rhinorrhea, sore throat, wheezing or hemoptysis.  No chest pain, palpitations, diaphoresis, PND, orthopnea or pitting edema of the lower extremities.  No abdominal pain, nausea, emesis, diarrhea, constipation, melena or hematochezia.  No flank pain, dysuria, frequency or hematuria.  No polyuria, polydipsia, polyphagia or blurred vision.   Lab work: CBC showed white count 5.8, hemoglobin 10.0 g/dL with an MCV of 897.9 and platelets 143.  BMP showed a chloride 96 mmol/L and calcium  8.8 mg/dL.  BNP was 96.8 pg/mL.  Imaging: Portable 1 view chest radiograph showed interval development of left lower lobe airspace opacity suspicious for left lower lobe pneumonia or aspiration pneumonitis.  2 view chest x-ray recommended in 3 to 4 weeks to ensure resolution after antibiotic therapy.  Mild enlargement of the cardiopericardial silhouette.  Low lung volumes.  ED course: Initial vital signs were temperature 98.2 F, pulse 64, respiration 14, BP 107/65 mmHg O2 sat 100% on nasal cannula oxygen at 4 LPM.  Patient received ceftriaxone  2 g IVPB and 1000 mL normal saline bolus.  Review of Systems: As mentioned in the history of present illness. All other  systems reviewed and are negative. Past Medical History:  Diagnosis Date   Acute hypoxemic respiratory failure due to COVID-19 [facility]) 03/01/2023   Allergy Morphine   ANEMIA 11/12/2007   Qualifier: Diagnosis of  By: Sherron CMA, Steen     Anxiety    Arthritis    ASTHMA NOS W/ACUTE EXACERBATION 01/13/2010   Qualifier: Diagnosis of  By: Joshua MD, Debby LITTIE.    BIPOLAR DISORDER UNSPECIFIED 10/29/2007   Qualifier: Diagnosis of  By: Joshua MD, Debby LITTIE.    CHF (congestive heart failure) (HCC)    COMMON MIGRAINE 12/29/2008   Qualifier: Diagnosis of  By: Joshua MD, Debby LITTIE.    Encephalopathy due to infection 04/13/2023   GERD 07/28/2009   Qualifier: Diagnosis of  By: Inocencio MD, [place] LABOR    HYPERLIPIDEMIA 10/29/2007   Qualifier: Diagnosis of  By: Joshua MD, Debby LITTIE.    HYPERTENSION, BENIGN ESSENTIAL 10/29/2007   Qualifier: Diagnosis of  By: Joshua MD, Debby LITTIE.    Orbital mass, right 03/13/2023   OSTEOPENIA 10/30/2007   Qualifier: Diagnosis of  By: Joshua MD, Debby LITTIE.    OVERACTIVE BLADDER 02/21/2008   Qualifier: Diagnosis of  By: Norleen MD, [place] ORN    SLEEP APNEA 12/31/2009   Qualifier: Diagnosis of  By: Joshua MD, Debby LITTIE.    Trigger finger, left little finger 03/15/2017   Past Surgical History:  Procedure Laterality Date   ABDOMINAL HYSTERECTOMY     BREAST IMPLANT REMOVAL Bilateral 2022   BREAST SURGERY  01/10/1978   implants bilateral   CARPAL TUNNEL RELEASE     CHOLECYSTECTOMY     COSMETIC SURGERY     EYE SURGERY  cataract both  eyes   HERNIA REPAIR     JOINT REPLACEMENT     PAROTID ENDOSCOPY     SPINE SURGERY  lower back   TUBAL LIGATION     Social History:  reports that she has never smoked. She has never used smokeless tobacco. She reports that she does not drink alcohol and does not use drugs.  Allergies  Allergen Reactions   Morphine Nausea And Vomiting    Family History  Problem Relation Age of Onset   Heart attack Mother    Hypertension Mother    Heart  failure Mother    Anxiety disorder Mother    Heart attack Father    Hypertension Father    Diabetes Father    Arthritis Father    Heart failure Brother    Alcohol abuse Maternal Uncle    Alcohol abuse Maternal Uncle    Breast cancer Neg Hx     Prior to Admission medications   Medication Sig Start Date End Date Taking? Authorizing Provider  acetaminophen  (TYLENOL ) 500 MG tablet Take 1 tablet (500 mg total) by mouth every 6 (six) hours as needed for mild pain (pain score 1-3) (or Fever >/= 101). Patient taking differently: Take 500 mg by mouth every 8 (eight) hours as needed for mild pain (pain score 1-3), moderate pain (pain score 4-6), fever or headache (or Fever >/= 101). 03/10/23  Yes Rai, Ripudeep K, MD  amiodarone  (PACERONE ) 200 MG tablet Take 1 tablet (200 mg total) by mouth daily. 07/07/23  Yes Terra Fairy PARAS, PA-C  amLODipine  (NORVASC ) 2.5 MG tablet Take 2.5 mg by mouth daily. 06/28/23  Yes [provider]  apixaban  (ELIQUIS ) 5 MG TABS tablet Take 1 tablet (5 mg total) by mouth 2 (two) times daily. 06/20/23  Yes Alvan Dorothyann BIRCH, MD  atorvastatin  (LIPITOR) 40 MG tablet Take 1 tablet (40 mg total) by mouth daily. 05/22/23  Yes Alvan Dorothyann BIRCH, MD  diltiazem  (CARDIZEM  CD) 180 MG 24 hr capsule Take 1 capsule (180 mg total) by mouth daily. 06/14/23  Yes Alvan Dorothyann BIRCH, MD  divalproex  (DEPAKOTE  ER) 500 MG 24 hr tablet Take 3 tablets (1,500 mg total) by mouth at bedtime. 09/01/22  Yes Cottle, Lorene KANDICE Raddle., MD  famotidine  (PEPCID ) 20 MG tablet Take 20 mg by mouth daily.   Yes [provider]  furosemide  (LASIX ) 40 MG tablet Take 1 tablet (40 mg total) by mouth daily. 05/17/23  Yes Fountain, Madison L, NP  levalbuterol  (XOPENEX ) 0.63 MG/3ML nebulizer solution Take 3 mLs (0.63 mg total) by nebulization every 6 (six) hours as needed for wheezing or shortness of breath. 03/10/23  Yes Rai, Ripudeep K, MD  melatonin 3 MG TABS tablet Take 1 tablet (3 mg total) by mouth at  bedtime as needed. Patient taking differently: Take 3 mg by mouth at bedtime as needed (sleep). 04/19/23  Yes Dahal, Chapman, MD  metoprolol  tartrate (LOPRESSOR ) 25 MG tablet Take 1 tablet (25 mg total) by mouth 2 (two) times daily. 07/07/23 10/05/23 Yes Terra Fairy PARAS, PA-C  mometasone -formoterol  (DULERA ) 100-5 MCG/ACT AERO Inhale 2 puffs into the lungs 2 (two) times daily. 03/10/23  Yes Rai, Ripudeep K, MD  oxybutynin  (DITROPAN ) 5 MG tablet Take 1 tablet (5 mg total) by mouth 2 (two) times daily. 05/23/23  Yes Alvan Dorothyann BIRCH, MD  polyethylene glycol (MIRALAX  / GLYCOLAX ) 17 g packet Take 17 g by mouth daily as needed for moderate constipation. 04/19/23  Yes Dahal, Chapman, MD  potassium chloride  SA (KLOR-CON   M) 20 MEQ tablet Take 1 tablet (20 mEq total) by mouth daily. 06/28/23  Yes Alvan Dorothyann BIRCH, MD  pregabalin  (LYRICA ) 50 MG capsule Take 1 capsule (50 mg total) by mouth 3 (three) times daily. 12/30/22  Yes Alvan Dorothyann BIRCH, MD  promethazine  (PHENERGAN ) 25 MG tablet TAKE ONE TABLET BY MOUTH EVERY 8 HOURS AS NEEDED FOR NAUSEA AND VOMITING Patient taking differently: Take 25 mg by mouth every 8 (eight) hours as needed for vomiting, refractory nausea / vomiting or nausea. 05/17/23  Yes Alvan Dorothyann BIRCH, MD  senna-docusate (SENOKOT-S) 8.6-50 MG tablet Take 2 tablets by mouth at bedtime. 03/10/23  Yes Rai, Ripudeep K, MD  torsemide  (DEMADEX ) 100 MG tablet Take 50 mg by mouth daily.   Yes [provider]  Glucose Blood (BLOOD GLUCOSE TEST STRIPS 333) STRP Test strips to be used with glucometer.  Patient will need to verify what brand she currently has.  Okay to fill any current brand to be testing 1 times per day PRN. Dx IFG 06/28/23   Alvan Dorothyann BIRCH, MD    Physical Exam: Vitals:   07/21/23 0930 07/21/23 0940 07/21/23 0945 07/21/23 1036  BP: (!) 99/51  114/69   Pulse: 72  85   Resp: 19  10   Temp:    98.3 F (36.8 C)  TempSrc:      SpO2: 98% 97% 98%    Physical  Exam Vitals reviewed.  Constitutional:      General: She is awake. She is not in acute distress.    Appearance: She is obese. She is ill-appearing.  HENT:     Head: Normocephalic.     Nose: No rhinorrhea.     Mouth/Throat:     Mouth: Mucous membranes are moist.  Eyes:     General: No scleral icterus.    Pupils: Pupils are equal, round, and reactive to light.  Neck:     Vascular: No JVD.  Cardiovascular:     Rate and Rhythm: Normal rate and regular rhythm.     Heart sounds: S1 normal and S2 normal.  Pulmonary:     Effort: Pulmonary effort is normal.     Breath sounds: Wheezing and rales present. No rhonchi.  Abdominal:     General: Bowel sounds are normal. There is no distension.     Palpations: Abdomen is soft.     Tenderness: There is no abdominal tenderness.  Musculoskeletal:     Cervical back: Neck supple.     Right lower leg: No edema.     Left lower leg: No edema.  Skin:    General: Skin is warm and dry.  Neurological:     General: No focal deficit present.     Mental Status: She is alert and oriented to person, place, and time.  Psychiatric:        Mood and Affect: Mood normal.        Behavior: Behavior normal. Behavior is cooperative.     Data Reviewed:  Results are pending, will review when available. 03/02/2023 echocardiogram report. IMPRESSIONS:   1. Left ventricular ejection fraction, by estimation, is 70 to 75%. The  left ventricle has hyperdynamic function. Left ventricular endocardial  border not optimally defined to evaluate regional wall motion. Left  ventricular diastolic parameters are  indeterminate.   2. Right ventricular systolic function is normal. The right ventricular  size is normal. Tricuspid regurgitation signal is inadequate for assessing  PA pressure.   3. The mitral valve was not  well visualized. No evidence of mitral valve  regurgitation. No evidence of mitral stenosis.   4. The aortic valve was not well visualized. Aortic valve  regurgitation  is not visualized. No aortic stenosis is present.   5. The inferior vena cava is normal in size with greater than 50%  respiratory variability, suggesting right atrial pressure of 3 mmHg.   EKG: Vent. rate 81 BPM PR interval 170 ms QRS duration 94 ms QT/QTcB 442/514 ms P-R-T axes 53 52 241 Sinus rhythm Low voltage, precordial leads Nonspecific T abnormalities, diffuse leads Prolonged QT interval?  Assessment and Plan: Principal Problem:   Left lower lobe pneumonia Admit to PCU/inpatient. Continue supplemental oxygen. Scheduled and as needed bronchodilators. Continue ceftriaxone  1 g IVPB daily. Continue azithromycin  500 mg IVPB daily. Check strep pneumoniae urinary antigen. Check sputum Gram stain, culture and sensitivity. Follow-up blood culture and sensitivity. Follow-up CBC and chemistry in the morning.  Active Problems:   Chronic heart failure with preserved ejection fraction (HCC) Seems euvolemic. Continue furosemide  40 mg p.o. daily. Continue metoprolol  25 mg p.o. twice daily    Essential hypertension Continue diltiazem  180 mg p.o. daily. Continue metoprolol  25 mg p.o. twice daily. Continue amlodipine  2.5 mg p.o. daily.    Paroxysmal atrial fibrillation (HCC) CHA?DS?-VASc Score of at least 5. Continue amiodarone  200 mg p.o. daily. Continue diltiazem  180 mg p.o. daily. Continue metoprolol  25 mg p.o. twice daily. Continue apixaban  5 mg p.o. twice daily.    GERD Antiacid, H2 blocker or PPI as needed.    Hyperlipidemia Continue atorvastatin  40 mg p.o. daily.    Prediabetes Currently euglycemic.   Class 3 obesity Current BMI 44.63 kg/m. Would benefit from lifestyle modifications. Follow-up closely with PCP    Fall   Physical deconditioning  Consult PT and OT. Will need placement.     Advance Care Planning:   Code Status: Full Code   Consults:   Family Communication:   Severity of Illness: The appropriate patient status for this  patient is INPATIENT. Inpatient status is judged to be reasonable and necessary in order to provide the required intensity of service to ensure the patient's safety. The patient's presenting symptoms, physical exam findings, and initial radiographic and laboratory data in the context of their chronic comorbidities is felt to place them at high risk for further clinical deterioration. Furthermore, it is not anticipated that the patient will be medically stable for discharge from the hospital within 2 midnights of admission.   * I certify that at the point of admission it is my clinical judgment that the patient will require inpatient hospital care spanning beyond 2 midnights from the point of admission due to high intensity of service, high risk for further deterioration and high frequency of surveillance required.*  Author: Alm Dorn Castor, MD 07/21/2023 2:26 PM  For on call review [email or web address].   This document was prepared using Dragon voice recognition software and may contain some unintended transcription errors.

## 2023-07-21 NOTE — NC FL2 (Cosign Needed Addendum)
 [place]  MEDICAID FL2 LEVEL OF CARE FORM     IDENTIFICATION  Patient Name: Donna Sexton Birthdate: 06/24/1950 Sex: female Admission Date (Current Location): 07/19/2023  [facility] and [place] Number:  Producer, television/film/video and Address:  [facility],  [address], [place] [address]      Provider Number: [id number]  Attending Physician Name and Address:  Suzette Pac, MD  Relative Name and Phone Number:  Darryle Fonda Rhody)  [phone] [facility])    Current Level of Care: Hospital Recommended Level of Care: Skilled Nursing Facility Prior Approval Number:    Date Approved/Denied:   PASRR Number: [id number]   Discharge Plan: SNF    Current Diagnoses: Patient Active Problem List   Diagnosis Date Noted   Concern about memory 04/14/2023   Tremor 04/14/2023   Encounter for monitoring amiodarone  therapy 03/21/2023   Hypercoagulable state due to persistent atrial fibrillation (HCC) 03/21/2023   V-tach (HCC) 03/01/2023   Atrial fibrillation with rapid ventricular response (HCC) 03/01/2023   Senile purpura (HCC) 02/16/2023   Palpitations 02/04/2022   DDD (degenerative disc disease), cervical 02/05/2021   Chronic heart failure with preserved ejection fraction (HCC) 05/16/2020   Impaired glucose tolerance 10/10/2019   Morbid obesity (HCC) 10/09/2019   Acute pain of right shoulder 02/23/2018   GAD (generalized anxiety disorder) 12/20/2017   Primary osteoarthritis of both knees 01/23/2017   Localized, primary osteoarthritis of hand, left 01/23/2017   Lumbar radiculitis 09/07/2015   IBS (irritable bowel syndrome) 08/07/2015   Elevated LFTs 05/18/2015   Hyperglycemia 05/12/2015   Lumbago 11/05/2014   Basal cell papilloma 09/11/2014   Restless leg 04/16/2014   Muscle spasm 02/27/2014   Overactive bladder 04/09/2013   Abnormal mammogram 06/22/2012   Bipolar disorder (HCC) 09/29/2011   Hyperlipidemia 09/29/2011   Essential hypertension 09/29/2011   Telogen  effluvium 08/16/2011   Calcium  blood increased 01/11/2011   Clinical depression 10/04/2010   Anal bleeding 10/04/2010   Cannot sleep 10/04/2010   GERD 07/28/2009   COMMON MIGRAINE 12/29/2008   OSTEOPENIA 10/30/2007    Orientation RESPIRATION BLADDER Height & Weight     Self, Situation, Place  O2 (4L Nasal Cannula) Continent Weight:   Height:     BEHAVIORAL SYMPTOMS/MOOD NEUROLOGICAL BOWEL NUTRITION STATUS      Continent Diet (Regular)  AMBULATORY STATUS COMMUNICATION OF NEEDS Skin   Extensive Assist Verbally Normal                       Personal Care Assistance Level of Assistance  Bathing, Dressing, Feeding Bathing Assistance: Maximum assistance Feeding assistance: Limited assistance Dressing Assistance: Maximum assistance     Functional Limitations Info  Sight, Speech, Hearing Sight Info: Adequate Hearing Info: Adequate Speech Info: Adequate    SPECIAL CARE FACTORS FREQUENCY  PT (By licensed PT), OT (By licensed OT)     PT Frequency: x5/week OT Frequency: x5/week            Contractures Contractures Info: Not present    Additional Factors Info  Code Status, Allergies Code Status Info: Full Allergies Info: Morphine           Current Medications (07/21/2023):  This is the current hospital active medication list Current Facility-Administered Medications  Medication Dose Route Frequency Provider Last Rate Last Admin   acetaminophen  (TYLENOL ) tablet 500 mg  500 mg Oral Q6H PRN Paterson, Robert C, MD       acetaminophen  (TYLENOL ) tablet 650 mg  650 mg Oral Q6H PRN  Yolande Lamar BROCKS, MD   650 mg at 07/20/23 1443   albuterol  (PROVENTIL ) (2.5 MG/3ML) 0.083% nebulizer solution 2.5 mg  2.5 mg Nebulization Q4H PRN Paterson, Robert C, MD       amiodarone  (PACERONE ) tablet 200 mg  200 mg Oral Daily Yolande Lamar BROCKS, MD   200 mg at 07/21/23 1011   apixaban  (ELIQUIS ) tablet 5 mg  5 mg Oral BID Paterson, Robert C, MD   5 mg at 07/21/23 1010   atorvastatin   (LIPITOR) tablet 40 mg  40 mg Oral Daily Yolande Lamar BROCKS, MD   40 mg at 07/21/23 1033   cephALEXin  (KEFLEX ) capsule 500 mg  500 mg Oral BID Paterson, Robert C, MD   500 mg at 07/21/23 1010   diltiazem  (CARDIZEM  CD) 24 hr capsule 180 mg  180 mg Oral Daily Yolande Lamar BROCKS, MD   180 mg at 07/21/23 1010   divalproex  (DEPAKOTE  ER) 24 hr tablet 1,500 mg  1,500 mg Oral QHS Yolande Lamar BROCKS, MD   1,500 mg at 07/20/23 2238   famotidine  (PEPCID ) tablet 20 mg  20 mg Oral Daily Paterson, Robert C, MD   20 mg at 07/21/23 1010   fluticasone  furoate-vilanterol (BREO ELLIPTA ) 100-25 MCG/ACT 1 puff  1 puff Inhalation Daily Yolande Lamar BROCKS, MD       furosemide  (LASIX ) tablet 40 mg  40 mg Oral Daily Paterson, Robert C, MD   40 mg at 07/21/23 1010   melatonin tablet 3 mg  3 mg Oral QHS PRN Paterson, Robert C, MD       metoprolol  tartrate (LOPRESSOR ) tablet 25 mg  25 mg Oral BID Paterson, Robert C, MD   25 mg at 07/20/23 1044   oxybutynin  (DITROPAN ) tablet 5 mg  5 mg Oral BID Yolande Lamar BROCKS, MD   5 mg at 07/21/23 1033   oxyCODONE  (Oxy IR/ROXICODONE ) immediate release tablet 2.5 mg  2.5 mg Oral Q3H PRN Paterson, Robert C, MD   2.5 mg at 07/21/23 0258   polyethylene glycol (MIRALAX  / GLYCOLAX ) packet 17 g  17 g Oral Daily PRN Paterson, Robert C, MD       potassium chloride  SA (KLOR-CON  M) CR tablet 20 mEq  20 mEq Oral Daily Paterson, Robert C, MD   20 mEq at 07/21/23 1010   pregabalin  (LYRICA ) capsule 50 mg  50 mg Oral TID Paterson, Robert C, MD   50 mg at 07/21/23 1010   senna-docusate (Senokot-S) tablet 2 tablet  2 tablet Oral QHS Paterson, Robert C, MD   2 tablet at 07/20/23 2238   Current Outpatient Medications  Medication Sig Dispense Refill   acetaminophen  (TYLENOL ) 500 MG tablet Take 1 tablet (500 mg total) by mouth every 6 (six) hours as needed for mild pain (pain score 1-3) (or Fever >/= 101). (Patient taking differently: Take 500 mg by mouth every 8 (eight) hours as needed for mild pain (pain  score 1-3), moderate pain (pain score 4-6), fever or headache (or Fever >/= 101).)     amiodarone  (PACERONE ) 200 MG tablet Take 1 tablet (200 mg total) by mouth daily. 30 tablet 6   amLODipine  (NORVASC ) 2.5 MG tablet Take 2.5 mg by mouth daily.     apixaban  (ELIQUIS ) 5 MG TABS tablet Take 1 tablet (5 mg total) by mouth 2 (two) times daily. 60 tablet 1   atorvastatin  (LIPITOR) 40 MG tablet Take 1 tablet (40 mg total) by mouth daily. 90 tablet 0   diltiazem  (CARDIZEM  CD) 180 MG  24 hr capsule Take 1 capsule (180 mg total) by mouth daily. 90 capsule 1   divalproex  (DEPAKOTE  ER) 500 MG 24 hr tablet Take 3 tablets (1,500 mg total) by mouth at bedtime. 270 tablet 1   famotidine  (PEPCID ) 20 MG tablet Take 20 mg by mouth daily.     furosemide  (LASIX ) 40 MG tablet Take 1 tablet (40 mg total) by mouth daily. 90 tablet 2   levalbuterol  (XOPENEX ) 0.63 MG/3ML nebulizer solution Take 3 mLs (0.63 mg total) by nebulization every 6 (six) hours as needed for wheezing or shortness of breath.     melatonin 3 MG TABS tablet Take 1 tablet (3 mg total) by mouth at bedtime as needed. (Patient taking differently: Take 3 mg by mouth at bedtime as needed (sleep).)     metoprolol  tartrate (LOPRESSOR ) 25 MG tablet Take 1 tablet (25 mg total) by mouth 2 (two) times daily. 60 tablet 3   mometasone -formoterol  (DULERA ) 100-5 MCG/ACT AERO Inhale 2 puffs into the lungs 2 (two) times daily.     oxybutynin  (DITROPAN ) 5 MG tablet Take 1 tablet (5 mg total) by mouth 2 (two) times daily. 180 tablet 1   polyethylene glycol (MIRALAX  / GLYCOLAX ) 17 g packet Take 17 g by mouth daily as needed for moderate constipation.     potassium chloride  SA (KLOR-CON  M) 20 MEQ tablet Take 1 tablet (20 mEq total) by mouth daily. 90 tablet 1   pregabalin  (LYRICA ) 50 MG capsule Take 1 capsule (50 mg total) by mouth 3 (three) times daily. 270 capsule 1   promethazine  (PHENERGAN ) 25 MG tablet TAKE ONE TABLET BY MOUTH EVERY 8 HOURS AS NEEDED FOR NAUSEA AND  VOMITING (Patient taking differently: Take 25 mg by mouth every 8 (eight) hours as needed for vomiting, refractory nausea / vomiting or nausea.) 90 tablet 0   senna-docusate (SENOKOT-S) 8.6-50 MG tablet Take 2 tablets by mouth at bedtime.     torsemide  (DEMADEX ) 100 MG tablet Take 50 mg by mouth daily.     Glucose Blood (BLOOD GLUCOSE TEST STRIPS 333) STRP Test strips to be used with glucometer.  Patient will need to verify what brand she currently has.  Okay to fill any current brand to be testing 1 times per day PRN. Dx IFG 50 strip 3     Discharge Medications: Please see discharge summary for a list of discharge medications.  Relevant Imaging Results:  Relevant Lab Results:   Additional Information SSN:[id number]  Kari JONETTA Daisy, LCSW

## 2023-07-21 NOTE — ED Notes (Signed)
 Patient transferred to hospital bed per PT request. Patient taking tactile stimuli and loud verbal stimuli to wake to transport.

## 2023-07-21 NOTE — ED Notes (Signed)
 Patient takes maximum effort to transport to hospital bed. Patient pulled from stretcher to hospital bed fall back to sleep immediately.

## 2023-07-22 DIAGNOSIS — W19XXXA Unspecified fall, initial encounter: Secondary | ICD-10-CM

## 2023-07-22 DIAGNOSIS — J189 Pneumonia, unspecified organism: Secondary | ICD-10-CM | POA: Diagnosis not present

## 2023-07-22 DIAGNOSIS — N3000 Acute cystitis without hematuria: Secondary | ICD-10-CM | POA: Diagnosis not present

## 2023-07-22 LAB — COMPREHENSIVE METABOLIC PANEL WITH GFR
ALT: 16 U/L (ref 0–44)
AST: 24 U/L (ref 15–41)
Albumin: 2.5 g/dL — ABNORMAL LOW (ref 3.5–5.0)
Alkaline Phosphatase: 47 U/L (ref 38–126)
Anion gap: 10 (ref 5–15)
BUN: 19 mg/dL (ref 8–23)
CO2: 28 mmol/L (ref 22–32)
Calcium: 9 mg/dL (ref 8.9–10.3)
Chloride: 98 mmol/L (ref 98–111)
Creatinine, Ser: 0.9 mg/dL (ref 0.44–1.00)
GFR, Estimated: >60 mL/min (ref >60)
Glucose, Bld: 109 mg/dL — ABNORMAL HIGH (ref 70–99)
Potassium: 3.8 mmol/L (ref 3.5–5.1)
Sodium: 136 mmol/L (ref 135–145)
Total Bilirubin: 0.7 mg/dL (ref 0.0–1.2)
Total Protein: 4.7 g/dL — ABNORMAL LOW (ref 6.5–8.1)

## 2023-07-22 LAB — CBC
HCT: 27.6 % — ABNORMAL LOW (ref 36.0–46.0)
Hemoglobin: 9.1 g/dL — ABNORMAL LOW (ref 12.0–15.0)
MCH: 34.2 pg — ABNORMAL HIGH (ref 26.0–34.0)
MCHC: 33 g/dL (ref 30.0–36.0)
MCV: 103.8 fL — ABNORMAL HIGH (ref 80.0–100.0)
Platelets: 113 K/uL — ABNORMAL LOW (ref 150–400)
RBC: 2.66 MIL/uL — ABNORMAL LOW (ref 3.87–5.11)
RDW: 13.3 % (ref 11.5–15.5)
WBC: 4.6 K/uL (ref 4.0–10.5)
nRBC: 0 % (ref 0.0–0.2)

## 2023-07-22 LAB — PHOSPHORUS: Phosphorus: 4.6 mg/dL (ref 2.5–4.6)

## 2023-07-22 LAB — MAGNESIUM: Magnesium: 1.9 mg/dL (ref 1.7–2.4)

## 2023-07-22 MED ORDER — IPRATROPIUM-ALBUTEROL 0.5-2.5 (3) MG/3ML IN SOLN
3.0000 mL | Freq: Two times a day (BID) | RESPIRATORY_TRACT | Status: DC
  Administered 2023-07-22 – 2023-07-26 (×7): 3 mL via RESPIRATORY_TRACT
  Filled 2023-07-22 (×8): qty 3

## 2023-07-22 MED ORDER — SODIUM CHLORIDE 0.9 % IV SOLN
2.0000 g | Freq: Three times a day (TID) | INTRAVENOUS | Status: DC
  Administered 2023-07-22 – 2023-07-27 (×15): 2 g via INTRAVENOUS
  Filled 2023-07-22 (×15): qty 12.5

## 2023-07-22 MED ORDER — VANCOMYCIN HCL 1250 MG/250ML IV SOLN
1250.0000 mg | INTRAVENOUS | Status: DC
  Administered 2023-07-23: 1250 mg via INTRAVENOUS
  Filled 2023-07-22: qty 250

## 2023-07-22 MED ORDER — VANCOMYCIN HCL 2000 MG/400ML IV SOLN
2000.0000 mg | Freq: Once | INTRAVENOUS | Status: AC
  Administered 2023-07-22: 2000 mg via INTRAVENOUS
  Filled 2023-07-22: qty 400

## 2023-07-22 NOTE — Evaluation (Signed)
 Occupational Therapy Evaluation Patient Details Name: Donna Sexton MRN: [id number] DOB: 1950-04-04 Today's Date: 07/22/2023   History of Present Illness   73 y.o. female admitted 07/19/23 after multiple falls, weakness.AMS, lethargy, weakness,  UTI. PMhx: morbid obesity, OSA, DM2, HTN, HLD, CHF, Afib, GERD, chronic anemia, migraine, bipolar disorder, arthritis, asthma     Clinical Impressions Pt admitted s/p fall. Pt currently with functional limitations due to the deficits listed below (see OT Problem List).  Pt will benefit from acute skilled OT to increase their safety and independence with ADL and functional mobility for ADL to facilitate discharge.   Spoke with MD and RN s/p OT eval regarding pts performance and tremors during OT eval.  Pts tremors VERY significant and really impacted pts ability to perform any ADL activity.  Per pt and ex husband this is a new problem and getting worse.      If plan is discharge home, recommend the following:   A lot of help with walking and/or transfers;A lot of help with bathing/dressing/bathroom     Functional Status Assessment   Patient has had a recent decline in their functional status and demonstrates the ability to make significant improvements in function in a reasonable and predictable amount of time.       Precautions/Restrictions   Precautions Precautions: Fall Precaution/Restrictions Comments: significant  bilateral UE tremors with sitting.  Unable to control.     Mobility Bed Mobility Overal bed mobility: Needs Assistance Bed Mobility: Rolling, Sidelying to Sit, Sit to Supine Rolling: +2 for physical assistance, +2 for safety/equipment, Max assist Sidelying to sit: +2 for safety/equipment, +2 for physical assistance, HOB elevated, Max assist   Sit to supine: +2 for safety/equipment, +2 for physical assistance, Max assist   General bed mobility comments: pt able to sit EOB with OT and perform grooming task     Transfers                  Did not perform        Balance Overall balance assessment: Needs assistance Sitting-balance support: Bilateral upper extremity supported, Feet supported Sitting balance-Leahy Scale: Poor                                     ADL either performed or assessed with clinical judgement   ADL Overall ADL's : Needs assistance/impaired Eating/Feeding: Total assistance;Sitting   Grooming: Maximal assistance;Sitting                                 General ADL Comments: Pt sat EOB with OT. BUE tremors significant and impact all ADL activity.  Spoke with RN about pt needing A with self feeding as tremors so significant she is not able to feed herself.  Pt overall Total A with ADL activity and would need 2 people for a transfer. Did not stand this OT session- worked EOB only     Charity fundraiser?: No apparent visual deficits            Pertinent Vitals/Pain Pain Assessment Pain Score: 10-Worst pain ever Pain Location: tail bone Pain Descriptors / Indicators: Discomfort, Grimacing, Guarding Pain Intervention(s): Limited activity within patient's tolerance, Monitored during session, Repositioned, Patient requesting pain meds-RN notified     Extremity/Trunk Assessment Upper Extremity Assessment RUE Deficits / Details: Significant BUE tremors noted.  Pt  also protective of LUE.  L hand with deficits pt states from fall LUE Deficits / Details: limited shoulder elevation   Lower Extremity Assessment RLE Deficits / Details: tolerated ~ 45 * knee flex, limited by pain LLE Deficits / Details: tolerated 90 * knee flex when sitting   Cervical / Trunk Assessment Cervical / Trunk Assessment: Kyphotic   Communication     Cognition Arousal: Alert Behavior During Therapy: WFL for tasks assessed/performed Cognition: No apparent impairments                                 Following commands impaired:  Only follows one step commands consistently     Cueing  General Comments   Cueing Techniques: Verbal cues;Tactile cues    Attempted to have pt reach for items as well as weight bear to decrease tremors          Home Living Family/patient expects to be discharged to:: Skilled nursing facility                                        Prior Functioning/Environment Prior Level of Function : Needs assist             Mobility Comments: uses RW in home ADLs Comments: pt states she went to the bathroom with walker    OT Problem List: Decreased strength;Decreased range of motion;Decreased activity tolerance;Impaired balance (sitting and/or standing);Decreased safety awareness;Obesity;Impaired UE functional use;Pain   OT Treatment/Interventions: Self-care/ADL training;Therapeutic exercise;Patient/family education      OT Goals(Current goals can be found in the care plan section)   Acute Rehab OT Goals Patient Stated Goal: less pain OT Goal Formulation: With patient Time For Goal Achievement: 08/05/23 Potential to Achieve Goals: Good   OT Frequency:  Min 2X/week       AM-PAC OT 6 Clicks Daily Activity     Outcome Measure Help from another person eating meals?: Total Help from another person taking care of personal grooming?: Total Help from another person toileting, which includes using toliet, bedpan, or urinal?: Total Help from another person bathing (including washing, rinsing, drying)?: Total Help from another person to put on and taking off regular upper body clothing?: Total Help from another person to put on and taking off regular lower body clothing?: Total 6 Click Score: 6   End of Session Nurse Communication: Mobility status  Activity Tolerance: Patient limited by pain Patient left: in bed;with call bell/phone within reach  OT Visit Diagnosis: Muscle weakness (generalized) (M62.81);Pain Pain - part of body:  (tail bone)                 Time: 8579-8545 OT Time Calculation (min): 34 min Charges:  OT General Charges $OT Visit: 1 Visit OT Evaluation $OT Eval Moderate Complexity: 1 Mod OT Treatments $Self Care/Home Management : 8-22 mins   Rosaleigh Brazzel, Norvel BIRCH 07/22/2023, 3:43 PM

## 2023-07-22 NOTE — Plan of Care (Signed)

## 2023-07-22 NOTE — Progress Notes (Signed)
 Pharmacy Antibiotic Note  Donna Sexton is a 73 y.o. female admitted on 07/19/2023 with pneumonia. Patient was originally admitted s/p fall but developed hypoxia/mild dyspnea on 7/11 prior to discharge to SNF. DG chest on 7/9 showed no active cardiopulmonary disease, however, DG chest form 7/11 showed concern for LLL pneumonia or aspiration pneumonitis. Treating as HCAP for now given patient's continued lethargy/generalized tremors 7/12, per MD. Pharmacy has been consulted for vancomycin  and cefepime  dosing.  Plan: - Give vancomycin  2000mg  IV x1 now, followed by 1250mg  IV q24h (eAUC 485 using Scr 0.9 and Vd 0.5) - Start cefepime  2g IV q8h - Monitor renal function, cultures, and overall clinical picture - MRSA PCR pending - De-escalate abx as able  Height: 5' 4 (162.6 cm) Weight: 117.9 kg (260 lb) IBW/kg (Calculated) : 54.7  Temp (24hrs), Avg:98.4 F (36.9 C), Min:97.5 F (36.4 C), Max:99.5 F (37.5 C)  Recent Labs  Lab 07/19/23 1737 07/21/23 1237 07/22/23 0540  WBC 9.7 5.8 4.6  CREATININE 1.04* 0.87 0.90    Estimated Creatinine Clearance: 69.3 mL/min (by C-G formula based on SCr of 0.9 mg/dL).    Allergies  Allergen Reactions   Morphine Nausea And Vomiting    Antimicrobials this admission: 7/9 ceftriaxone  x1; 7/11 >> 7/12 7/10 cephalexin  >> 7/11 7/11 azithromycin  >> 7/12 7/12 vancomycin  >> 7/12 cefepime  >>  Dose adjustments this admission: N/A  Microbiology results: 7/9 BCx: NG x3 days 7/9 UCx: multiple species  7/12 MRSA PCR: not yet collected  Thank you for allowing pharmacy to be a part of this patient's care.  Lacinda Moats, PharmD Clinical Pharmacist  7/12/20253:44 PM

## 2023-07-22 NOTE — Progress Notes (Signed)
  Progress Note   Patient: Donna Sexton FMW:[id number] DOB: March 25, 1950 DOA: 07/19/2023     1 DOS: the patient was seen and examined on 07/22/2023   Brief hospital course: 73 y.o. female with medical history significant of respiratory failure due to COVID-19, anemia, anxiety, osteoarthritis, asthma, bipolar disorder, CHF,, migraine, encephalopathy, GERD, hyperlipidemia, hypertension, osteopenia, overactive bladder who presented to the emergency department with a fall with benign workup and was awaiting placement for SNF but subsequently became hypoxic mildly dyspneic.  Imaging revealed left lower lobe infiltrate. She denied fever, chills, rhinorrhea, sore throat, wheezing or hemoptysis.  No chest pain, palpitations, diaphoresis, PND, orthopnea or pitting edema of the lower extremities.  No abdominal pain, nausea, emesis, diarrhea, constipation, melena or hematochezia.  No flank pain, dysuria, frequency or hematuria.  No polyuria, polydipsia, polyphagia or blurred vision.   Assessment and Plan: Principal Problem:   Left lower lobe pneumonia -Pt noted to have developed pneumonia while in ED awaiting placement to SNF with resultant hypoxemia -Pt was started on ceftriaxone  with azithromycin  for CAP -strep pneumoniae urinary antigen pending -blood cx pending -on 7/12, noted to remain lethargic with generalized tremors -Will change regimen to cover HCAP with vanc and cefepime   Likely UTI  -UA reviewed, consistent with UTI in the setting of lethargy -Urine cx with multiple species present -on cefepime  per above   Active Problems:   Chronic heart failure with preserved ejection fraction (HCC) Seems euvolemic to dry Continue furosemide  40 mg p.o. daily. Continue metoprolol  25 mg p.o. twice daily     Essential hypertension Continue diltiazem  180 mg p.o. daily. Continue metoprolol  25 mg p.o. twice daily. Continue amlodipine  2.5 mg p.o. daily.     Paroxysmal atrial fibrillation  (HCC) CHA?DS?-VASc Score of at least 5. Continue amiodarone  200 mg p.o. daily. Continue diltiazem  180 mg p.o. daily. Continue metoprolol  25 mg p.o. twice daily. Continue apixaban  5 mg p.o. twice daily.     GERD Antiacid, H2 blocker or PPI as needed.     Hyperlipidemia Continue atorvastatin  40 mg p.o. daily.     Prediabetes Currently euglycemic.    Class 3 obesity Current BMI 44.63 kg/m. Would benefit from lifestyle modifications.     Fall   Physical deconditioning  PT/OT consulted -SNF placement pending   Subjective: Difficult to assess given mentation  Physical Exam: Vitals:   07/22/23 0812 07/22/23 0820 07/22/23 1156 07/22/23 1501  BP:  (!) 100/56 97/62   Pulse:  63 64   Resp:  17 18   Temp:   (!) 97.5 F (36.4 C)   TempSrc:   Oral   SpO2: 95% 100% 100% 94%  Weight:      Height:       General exam: Laying in bed, in nad Respiratory system: Normal respiratory effort, no wheezing Cardiovascular system: regular rate, s1, s2 Gastrointestinal system: Soft, nondistended, positive BS Central nervous system: CN2-12 grossly intact, strength intact Extremities: Perfused, no clubbing Skin: Normal skin turgor, no notable skin lesions seen Psychiatry: Difficult to assess given mentation  Data Reviewed:  Labs reviewed: Na 136, K 3.8, Cr 0.90, WBC 4.6, Hgb 9.1, plts 113  Family Communication: Pt in room, family not at bedside  Disposition: Status is: Inpatient Remains inpatient appropriate because: severity of illness  Planned Discharge Destination: Skilled nursing facility    Author: Garnette Pelt, MD 07/22/2023 3:32 PM  For on call review [email or web address].

## 2023-07-22 NOTE — Hospital Course (Signed)
 73 y.o. female with medical history significant of respiratory failure due to COVID-19, anemia, anxiety, osteoarthritis, asthma, bipolar disorder, CHF,, migraine, encephalopathy, GERD, hyperlipidemia, hypertension, osteopenia, overactive bladder who presented to the emergency department with a fall with benign workup and was awaiting placement for SNF but subsequently became hypoxic mildly dyspneic.  Imaging revealed left lower lobe infiltrate. She denied fever, chills, rhinorrhea, sore throat, wheezing or hemoptysis.  No chest pain, palpitations, diaphoresis, PND, orthopnea or pitting edema of the lower extremities.  No abdominal pain, nausea, emesis, diarrhea, constipation, melena or hematochezia.  No flank pain, dysuria, frequency or hematuria.  No polyuria, polydipsia, polyphagia or blurred vision.

## 2023-07-23 ENCOUNTER — Inpatient Hospital Stay (HOSPITAL_COMMUNITY)

## 2023-07-23 DIAGNOSIS — R4182 Altered mental status, unspecified: Secondary | ICD-10-CM | POA: Diagnosis not present

## 2023-07-23 DIAGNOSIS — N3000 Acute cystitis without hematuria: Secondary | ICD-10-CM | POA: Diagnosis not present

## 2023-07-23 DIAGNOSIS — W19XXXA Unspecified fall, initial encounter: Secondary | ICD-10-CM | POA: Diagnosis not present

## 2023-07-23 DIAGNOSIS — J189 Pneumonia, unspecified organism: Secondary | ICD-10-CM | POA: Diagnosis not present

## 2023-07-23 LAB — CBC
HCT: 28.5 % — ABNORMAL LOW (ref 36.0–46.0)
Hemoglobin: 9.3 g/dL — ABNORMAL LOW (ref 12.0–15.0)
MCH: 33.7 pg (ref 26.0–34.0)
MCHC: 32.6 g/dL (ref 30.0–36.0)
MCV: 103.3 fL — ABNORMAL HIGH (ref 80.0–100.0)
Platelets: 129 K/uL — ABNORMAL LOW (ref 150–400)
RBC: 2.76 MIL/uL — ABNORMAL LOW (ref 3.87–5.11)
RDW: 13.4 % (ref 11.5–15.5)
WBC: 5.1 K/uL (ref 4.0–10.5)
nRBC: 0 % (ref 0.0–0.2)

## 2023-07-23 LAB — COMPREHENSIVE METABOLIC PANEL WITH GFR
ALT: 17 U/L (ref 0–44)
AST: 24 U/L (ref 15–41)
Albumin: 2.5 g/dL — ABNORMAL LOW (ref 3.5–5.0)
Alkaline Phosphatase: 44 U/L (ref 38–126)
Anion gap: 9 (ref 5–15)
BUN: 21 mg/dL (ref 8–23)
CO2: 27 mmol/L (ref 22–32)
Calcium: 8.9 mg/dL (ref 8.9–10.3)
Chloride: 102 mmol/L (ref 98–111)
Creatinine, Ser: 0.8 mg/dL (ref 0.44–1.00)
GFR, Estimated: >60 mL/min (ref >60)
Glucose, Bld: 132 mg/dL — ABNORMAL HIGH (ref 70–99)
Potassium: 3.9 mmol/L (ref 3.5–5.1)
Sodium: 138 mmol/L (ref 135–145)
Total Bilirubin: 0.7 mg/dL (ref 0.0–1.2)
Total Protein: 4.9 g/dL — ABNORMAL LOW (ref 6.5–8.1)

## 2023-07-23 LAB — AMMONIA: Ammonia: 18 umol/L (ref 9–35)

## 2023-07-23 LAB — LACTIC ACID, PLASMA
Lactic Acid, Venous: 1.9 mmol/L (ref 0.5–1.9)
Lactic Acid, Venous: 2 mmol/L (ref 0.5–1.9)

## 2023-07-23 MED ORDER — LACTATED RINGERS IV SOLN: INTRAVENOUS | Status: AC

## 2023-07-23 NOTE — Progress Notes (Signed)
 Made NP Andrez aware that the patient is experiencing increase weeping and third spacing of her extremities. Her blood pressure reading have been 90s over 50s and she's been a little harder to arouse and sleeper than usual. Last night she was much for talkative. She is arousable when you call her name and tap her shoulder. No new orders given at this time. Plan of care on going.

## 2023-07-23 NOTE — Progress Notes (Signed)
 Progress Note   Patient: Donna Sexton FMW:[id number] DOB: 04/25/1950 DOA: 07/19/2023     2 DOS: the patient was seen and examined on 07/23/2023   Brief hospital course: 73 y.o. female with medical history significant of respiratory failure due to COVID-19, anemia, anxiety, osteoarthritis, asthma, bipolar disorder, CHF,, migraine, encephalopathy, GERD, hyperlipidemia, hypertension, osteopenia, overactive bladder who presented to the emergency department with a fall with benign workup and was awaiting placement for SNF but subsequently became hypoxic mildly dyspneic.  Imaging revealed left lower lobe infiltrate. She denied fever, chills, rhinorrhea, sore throat, wheezing or hemoptysis.  No chest pain, palpitations, diaphoresis, PND, orthopnea or pitting edema of the lower extremities.  No abdominal pain, nausea, emesis, diarrhea, constipation, melena or hematochezia.  No flank pain, dysuria, frequency or hematuria.  No polyuria, polydipsia, polyphagia or blurred vision.   Assessment and Plan: Principal Problem:   Left lower lobe pneumonia -Pt noted to have developed pneumonia while in ED awaiting placement to SNF with resultant hypoxemia -Pt was started on ceftriaxone  with azithromycin  for CAP -strep pneumoniae urinary antigen pending -blood cx without growth thus far -on 7/12, noted to remain lethargic with generalized tremors -regimen was changed to cover HCAP with vanc and cefepime  -repeat CXR again shows PNA  Likely UTI  -UA reviewed, consistent with UTI in the setting of lethargy -Urine cx with multiple species present -on cefepime  per above   Active Problems:   Chronic heart failure with preserved ejection fraction (HCC) Seems euvolemic to dry Stop lasix  given very limited PO intake Continue metoprolol  25 mg p.o. twice daily     Essential hypertension Continue diltiazem  180 mg p.o. daily. Continue metoprolol  25 mg p.o. twice daily. Continue amlodipine  2.5 mg p.o. daily.      Paroxysmal atrial fibrillation (HCC) CHA?DS?-VASc Score of at least 5. Continue amiodarone  200 mg p.o. daily. Continue diltiazem  180 mg p.o. daily. Continue metoprolol  25 mg p.o. twice daily. Continue apixaban  5 mg p.o. twice daily.     GERD Antiacid, H2 blocker or PPI as needed.     Hyperlipidemia Continue atorvastatin  40 mg p.o. daily.     Prediabetes Currently euglycemic.    Class 3 obesity Current BMI 44.63 kg/m. Would benefit from lifestyle modifications.     Fall   Physical deconditioning  PT/OT consulted -SNF placement pending  Toxic metabolic encephalopathy secondary to Pneumonia -Was very conversant before admit, now lethargic, difficult to stay aroused -Cont abx per above -Will stop sedating meds including oxy and lyrica  -ordered and reviewed head CT. Neg -Ammonia normal  Code status -Confirmed with family, Code Status is FULL   Subjective: cannot assess this afternoon secondary to mentation  Physical Exam: Vitals:   07/23/23 0855 07/23/23 1155 07/23/23 1220 07/23/23 1413  BP: (!) 105/55  (!) 117/57 100/61  Pulse: 81  84 84  Resp: 19   18  Temp: 99.2 F (37.3 C)     TempSrc: Oral     SpO2: 97% 95% 98% 97%  Weight:      Height:       General exam: laying in bed, in no acute distress Respiratory system: normal chest rise, clear, no audible wheezing Cardiovascular system: regular rhythm, s1-s2 Gastrointestinal system: Nondistended, nontender, pos BS Central nervous system: No seizures, no tremors Extremities: No cyanosis, no joint deformities Skin: No rashes, no pallor Psychiatry: Unable to assess given mentation  Data Reviewed:  Labs reviewed: Na 238, K 3.9, Cr 0.80, WBC 5.1, Hgb 9.3, Plts 129  Family Communication: Pt  in room, family at bedside  Disposition: Status is: Inpatient Remains inpatient appropriate because: severity of illness  Planned Discharge Destination: Skilled nursing facility    Author: Garnette Pelt, MD 07/23/2023  4:57 PM  For on call review [email or web address].

## 2023-07-23 NOTE — Significant Event (Signed)
 Rapid Response Event Note   Reason for Call : Nurse called concern that patients level of consciousness has changed since yesterday. B/P 100/60    Initial Focused Assessment:  Patient is sleepy, but arouses easily a/ox4: completed NIH scale  MD notified already ordered head CT   Interventions:  NIH scale  Lactic Acid Ammonia  Level    Plan of Care:  Head Ct @ 1430     Event Summary:   MD Notified: Dr. Cindy  Call Time: 1355 Arrival Time:1400 End Time: 1430  Heron PARAS Boss Danielsen, RN

## 2023-07-23 NOTE — Plan of Care (Signed)

## 2023-07-24 DIAGNOSIS — J189 Pneumonia, unspecified organism: Secondary | ICD-10-CM | POA: Diagnosis not present

## 2023-07-24 DIAGNOSIS — W19XXXA Unspecified fall, initial encounter: Secondary | ICD-10-CM | POA: Diagnosis not present

## 2023-07-24 DIAGNOSIS — N3000 Acute cystitis without hematuria: Secondary | ICD-10-CM | POA: Diagnosis not present

## 2023-07-24 LAB — CBC
HCT: 26.4 % — ABNORMAL LOW (ref 36.0–46.0)
Hemoglobin: 8.9 g/dL — ABNORMAL LOW (ref 12.0–15.0)
MCH: 34.2 pg — ABNORMAL HIGH (ref 26.0–34.0)
MCHC: 33.7 g/dL (ref 30.0–36.0)
MCV: 101.5 fL — ABNORMAL HIGH (ref 80.0–100.0)
Platelets: 121 K/uL — ABNORMAL LOW (ref 150–400)
RBC: 2.6 MIL/uL — ABNORMAL LOW (ref 3.87–5.11)
RDW: 13.1 % (ref 11.5–15.5)
WBC: 4.6 K/uL (ref 4.0–10.5)
nRBC: 0 % (ref 0.0–0.2)

## 2023-07-24 LAB — COMPREHENSIVE METABOLIC PANEL WITH GFR
ALT: 15 U/L (ref 0–44)
AST: 19 U/L (ref 15–41)
Albumin: 2.4 g/dL — ABNORMAL LOW (ref 3.5–5.0)
Alkaline Phosphatase: 41 U/L (ref 38–126)
Anion gap: 10 (ref 5–15)
BUN: 16 mg/dL (ref 8–23)
CO2: 26 mmol/L (ref 22–32)
Calcium: 9.1 mg/dL (ref 8.9–10.3)
Chloride: 102 mmol/L (ref 98–111)
Creatinine, Ser: 0.61 mg/dL (ref 0.44–1.00)
GFR, Estimated: >60 mL/min (ref >60)
Glucose, Bld: 117 mg/dL — ABNORMAL HIGH (ref 70–99)
Potassium: 4.1 mmol/L (ref 3.5–5.1)
Sodium: 138 mmol/L (ref 135–145)
Total Bilirubin: 0.8 mg/dL (ref 0.0–1.2)
Total Protein: 4.5 g/dL — ABNORMAL LOW (ref 6.5–8.1)

## 2023-07-24 LAB — CULTURE, BLOOD (ROUTINE X 2): Culture: NO GROWTH

## 2023-07-24 LAB — MRSA NEXT GEN BY PCR, NASAL: MRSA by PCR Next Gen: NOT DETECTED

## 2023-07-24 MED ORDER — PREGABALIN 50 MG PO CAPS
50.0000 mg | ORAL_CAPSULE | Freq: Two times a day (BID) | ORAL | Status: DC
  Administered 2023-07-24 – 2023-07-27 (×7): 50 mg via ORAL
  Filled 2023-07-24 (×7): qty 1

## 2023-07-24 MED ORDER — ENSURE PLUS HIGH PROTEIN PO LIQD
237.0000 mL | Freq: Two times a day (BID) | ORAL | Status: DC
  Administered 2023-07-24 – 2023-07-27 (×4): 237 mL via ORAL

## 2023-07-24 MED ORDER — KETOROLAC TROMETHAMINE 15 MG/ML IJ SOLN
15.0000 mg | Freq: Once | INTRAMUSCULAR | Status: AC
  Administered 2023-07-24: 15 mg via INTRAVENOUS
  Filled 2023-07-24: qty 1

## 2023-07-24 NOTE — Progress Notes (Signed)
 Progress Note   Patient: Donna Sexton FMW:[id number] DOB: 01/16/1950 DOA: 07/19/2023     3 DOS: the patient was seen and examined on 07/24/2023   Brief hospital course: 73 y.o. female with medical history significant of respiratory failure due to COVID-19, anemia, anxiety, osteoarthritis, asthma, bipolar disorder, CHF,, migraine, encephalopathy, GERD, hyperlipidemia, hypertension, osteopenia, overactive bladder who presented to the emergency department with a fall with benign workup and was awaiting placement for SNF but subsequently became hypoxic mildly dyspneic.  Imaging revealed left lower lobe infiltrate. She denied fever, chills, rhinorrhea, sore throat, wheezing or hemoptysis.  No chest pain, palpitations, diaphoresis, PND, orthopnea or pitting edema of the lower extremities.  No abdominal pain, nausea, emesis, diarrhea, constipation, melena or hematochezia.  No flank pain, dysuria, frequency or hematuria.  No polyuria, polydipsia, polyphagia or blurred vision.   Assessment and Plan: Principal Problem:   Left lower lobe pneumonia -Pt noted to have developed pneumonia while in ED awaiting placement to SNF with resultant hypoxemia -Pt was started on ceftriaxone  with azithromycin  for CAP -strep pneumoniae urinary antigen pending -blood cx without growth thus far -on 7/12, noted to remain lethargic with generalized tremors and remained lethargic through 7/13 -now continued on cefepime  for HCAP coverage. MRSA swab neg, thus vanc d/c'd -repeat CXR 7/13 again shows PNA  Likely UTI  -UA reviewed, consistent with UTI in the setting of lethargy -Urine cx with multiple species present -on cefepime  per above   Active Problems:   Chronic heart failure with preserved ejection fraction (HCC) Stopedp lasix  given very limited PO intake Continue metoprolol  25 mg p.o. twice daily     Essential hypertension Continue diltiazem  180 mg p.o. daily. Continue metoprolol  25 mg p.o. twice daily. Continue  amlodipine  2.5 mg p.o. daily.     Paroxysmal atrial fibrillation (HCC) CHA?DS?-VASc Score of at least 5. Continue amiodarone  200 mg p.o. daily. Continue diltiazem  180 mg p.o. daily. Continue metoprolol  25 mg p.o. twice daily. Continue apixaban  5 mg p.o. twice daily.     GERD Antiacid, H2 blocker or PPI as needed.     Hyperlipidemia Continue atorvastatin  40 mg p.o. daily.     Prediabetes Currently euglycemic.    Class 3 obesity Current BMI 44.63 kg/m. Would benefit from lifestyle modifications.     Fall   Physical deconditioning  PT/OT consulted -SNF placement per TOC  Toxic metabolic encephalopathy secondary to Pneumonia -Was very conversant before admit, presented very lethargic, difficult to stay aroused though 7/13, Rapid Response was involved. See documentation -Cont abx per above - head CT. Neg -Ammonia normal -Held sedating meds, lyrica  and oxy. Mentation this afternoon seems improved. Pt alert and conversant  Code status -Confirmed with family, Code Status is FULL   Subjective: More alert and conversant this AM. Asking about pain meds  Physical Exam: Vitals:   07/23/23 2015 07/24/23 0329 07/24/23 0621 07/24/23 1532  BP: 129/64 129/66  132/80  Pulse: 74 79  90  Resp: 20 20  16   Temp: 98.9 F (37.2 C) 98.6 F (37 C)  98 F (36.7 C)  TempSrc:      SpO2: 97% 100% 100% 98%  Weight:      Height:       General exam: Awake, laying in bed, in nad Respiratory system: Normal respiratory effort, no wheezing Cardiovascular system: regular rate, s1, s2 Gastrointestinal system: Soft, nondistended, positive BS Central nervous system: CN2-12 grossly intact, strength intact Extremities: Perfused, no clubbing Skin: Normal skin turgor, no notable skin lesions seen  Psychiatry: Mood normal // affect seems normal  Data Reviewed:  Labs reviewed: Na 138, K 4.1, Cr 0.61, WBC 4.6, Hgb 8.9, Plts 121  Family Communication: Pt in room, family at  bedside  Disposition: Status is: Inpatient Remains inpatient appropriate because: severity of illness  Planned Discharge Destination: Skilled nursing facility    Author: Garnette Pelt, MD 07/24/2023 4:20 PM  For on call review [email or web address].

## 2023-07-24 NOTE — Telephone Encounter (Signed)
 Spoke with patient's husband Fonda. States was admitted to hospital - pneumonia due to aspiration. He was told that she should be there a few more days . He  will again speak to a Child psychotherapist before discharge to discuss the placement ( he was wanting patient placed in [place] facility) - he was somewhat frustrated with abrasive tone of previous Child psychotherapist and states that he was not happy with locations offered by previous Child psychotherapist either.

## 2023-07-24 NOTE — Evaluation (Signed)
 Clinical/Bedside Swallow Evaluation Patient Details  Name: Donna Sexton MRN: [id number] Date of Birth: Apr 02, 1950  Today's Date: 07/24/2023 Time: SLP Start Time (ACUTE ONLY): 1037 SLP Stop Time (ACUTE ONLY): 1049 SLP Time Calculation (min) (ACUTE ONLY): 12 min  Past Medical History:  Past Medical History:  Diagnosis Date   Acute hypoxemic respiratory failure due to COVID-19 (HCC) 03/01/2023   Allergy Morphine   ANEMIA 11/12/2007   Qualifier: Diagnosis of  By: Sherron CMA, Cree.Cornelia     Anxiety    Arthritis    ASTHMA NOS W/ACUTE EXACERBATION 01/13/2010   Qualifier: Diagnosis of  By: Joshua MD, Debby LITTIE.    BIPOLAR DISORDER UNSPECIFIED 10/29/2007   Qualifier: Diagnosis of  By: Joshua MD, Debby LITTIE.    CHF (congestive heart failure) (HCC)    COMMON MIGRAINE 12/29/2008   Qualifier: Diagnosis of  By: Joshua MD, Debby LITTIE.    Encephalopathy due to infection 04/13/2023   GERD 07/28/2009   Qualifier: Diagnosis of  By: Inocencio MD, [place] LABOR    HYPERLIPIDEMIA 10/29/2007   Qualifier: Diagnosis of  By: Joshua MD, Debby LITTIE.    HYPERTENSION, BENIGN ESSENTIAL 10/29/2007   Qualifier: Diagnosis of  By: Joshua MD, Debby LITTIE.    Orbital mass, right 03/13/2023   OSTEOPENIA 10/30/2007   Qualifier: Diagnosis of  By: Joshua MD, Debby LITTIE.    OVERACTIVE BLADDER 02/21/2008   Qualifier: Diagnosis of  By: Norleen MD, [place] ORN    SLEEP APNEA 12/31/2009   Qualifier: Diagnosis of  By: Joshua MD, Debby LITTIE.    Trigger finger, left little finger 03/15/2017   Past Surgical History:  Past Surgical History:  Procedure Laterality Date   ABDOMINAL HYSTERECTOMY     BREAST IMPLANT REMOVAL Bilateral 2022   BREAST SURGERY  01/10/1978   implants bilateral   CARPAL TUNNEL RELEASE     CHOLECYSTECTOMY     COSMETIC SURGERY     EYE SURGERY  cataract both eyes   HERNIA REPAIR     JOINT REPLACEMENT     PAROTID ENDOSCOPY     SPINE SURGERY  lower back   TUBAL LIGATION     HPI:  Patient is a 73 y.o. female with PMH:  espiratory failure due to COVID-19, anemia, anxiety, osteoarthritis, asthma, bipolar disorder, CHF,, migraine, encephalopathy, GERD, hyperlipidemia, hypertension, osteopenia, overactive bladder. She presented to the ED with a fall on 07/21/23 and while awaiting SNF placement, she became hypoxic and mildly dyspneic. Imaging revealed LLL infiltrate. MD ordered SLP swallow evaluation secondary to sudden onset of PNA.    Assessment / Plan / Recommendation  Clinical Impression  Patient is not currently presenting with clinical s/s of dysphagia as per this bedside swallow evaluation. Her spouse did mention that last Thursday she had a brief instance of coughing when eating food but this seems to have been an isolated incident. SLP assessed patient's swallow via PO's of thin liquids and regular solids. In addition, SLP observed her taking her medications (whole tablets) with thin liquids when RN arrived. No overt s/s aspiration observed, swallow initiation appeared timely and patient without c/o difficulty with swallowing. Further skilled SLP intervention or assessment not indicated at this time. SLP Visit Diagnosis: Dysphagia, unspecified (R13.10)    Aspiration Risk  No limitations    Diet Recommendation Regular;Thin liquid    Liquid Administration via: Cup;Straw Medication Administration: Whole meds with liquid Postural Changes: Seated upright at 90 degrees    Other  Recommendations Oral Care Recommendations: Oral care BID  Assistance Recommended at Discharge    Functional Status Assessment Patient has not had a recent decline in their functional status  Frequency and Duration     N/A       Prognosis   N/A     Swallow Study   General Date of Onset: 07/23/23 HPI: Patient is a 73 y.o. female with PMH: espiratory failure due to COVID-19, anemia, anxiety, osteoarthritis, asthma, bipolar disorder, CHF,, migraine, encephalopathy, GERD, hyperlipidemia, hypertension, osteopenia, overactive  bladder. She presented to the ED with a fall on 07/21/23 and while awaiting SNF placement, she became hypoxic and mildly dyspneic. Imaging revealed LLL infiltrate. MD ordered SLP swallow evaluation secondary to sudden onset of PNA. Type of Study: Bedside Swallow Evaluation Previous Swallow Assessment: none found Diet Prior to this Study: Regular;Thin liquids (Level 0) Temperature Spikes Noted: No Respiratory Status: Nasal cannula History of Recent Intubation: No Behavior/Cognition: Alert;Cooperative;Pleasant mood Oral Cavity Assessment: Within Functional Limits Oral Care Completed by SLP: No Oral Cavity - Dentition: Adequate natural dentition Vision: Functional for self-feeding Self-Feeding Abilities: Able to feed self Patient Positioning: Upright in bed Baseline Vocal Quality: Normal Volitional Cough: Strong Volitional Swallow: Able to elicit    Oral/Motor/Sensory Function Overall Oral Motor/Sensory Function: Within functional limits   Ice Chips     Thin Liquid Thin Liquid: Within functional limits Presentation: Straw;Self Fed    Nectar Thick     Honey Thick     Puree Puree: Not tested   Solid     Solid: Within functional limits Presentation: Self Fed      Norleen IVAR Blase, MA, CCC-SLP Speech Therapy

## 2023-07-24 NOTE — Progress Notes (Signed)
 Physical Therapy Treatment Patient Details Name: Donna Sexton MRN: [id number] DOB: 16-Jun-1950 Today's Date: 07/24/2023   History of Present Illness 73 y.o. female admitted 07/19/23 after multiple falls, weakness.AMS, lethargy, weakness,  UTI. PMhx: morbid obesity, OSA, DM2, HTN, HLD, CHF, Afib, GERD, chronic anemia, migraine, bipolar disorder, arthritis, asthma    PT Comments  Pt assisted with sitting EOB and tolerated at least 10 minutes of sitting.  Stedy placed for pt to stand and transfer to recliner however pt reluctant despite encouragement provided.   Pt with edematous and tender LUE especially hand.  Bil UE tremors remain present today as well especially with movement. RN and NT present for session and aware.     If plan is discharge home, recommend the following: Two people to help with walking and/or transfers;A lot of help with bathing/dressing/bathroom;Assistance with cooking/housework;Assist for transportation   Can travel by private vehicle        Equipment Recommendations  None recommended by PT    Recommendations for Other Services       Precautions / Restrictions Precautions Precautions: Fall Precaution/Restrictions Comments: significant  bilateral UE tremors with sitting.  Unable to control.  Currently on 4L O2 [place]     Mobility  Bed Mobility Overal bed mobility: Needs Assistance Bed Mobility: Supine to Sit, Sit to Supine   Sidelying to sit: +2 for safety/equipment, +2 for physical assistance, HOB elevated, Max assist   Sit to supine: +2 for safety/equipment, +2 for physical assistance, Total assist   General bed mobility comments: multimodal cues for sequencing, assist due to weakness; currently on 4L O2 [place]    Transfers                   General transfer comment: stedy placed in front of pt once EOB however pt not willing to stand, sat EOB for at least 10 minutes; pt reports dizziness however BP WNL and RN present/aware and assisted with session     Ambulation/Gait                   Stairs             Wheelchair Mobility     Tilt Bed    Modified Rankin (Stroke Patients Only)       Balance Overall balance assessment: Needs assistance Sitting-balance support: Feet supported Sitting balance-Leahy Scale: Fair Sitting balance - Comments: once EOB, able to sit without trunk support                                    Communication Communication Communication: Impaired Factors Affecting Communication: Reduced clarity of speech;Difficulty expressing self  Cognition Arousal: Alert Behavior During Therapy: Lability   PT - Cognitive impairments: Orientation, Memory, Attention, Problem solving, Safety/Judgement                       PT - Cognition Comments: pt appears confused but occasionally making appropriate comments; pt very frustrated with her tremors and current condition Following commands: Impaired Following commands impaired: Follows one step commands inconsistently    Cueing    Exercises      General Comments        Pertinent Vitals/Pain Pain Assessment Pain Assessment: Faces Faces Pain Scale: Hurts whole lot Pain Location: very tender to touch especially left hand and left leg Pain Descriptors / Indicators: Tender Pain Intervention(s): Repositioned, Monitored during session (RN  present and aware)    Home Living                          Prior Function            PT Goals (current goals can now be found in the care plan section) Progress towards PT goals: Progressing toward goals    Frequency    Min 2X/week      PT Plan      Co-evaluation              AM-PAC PT 6 Clicks Mobility   Outcome Measure  Help needed turning from your back to your side while in a flat bed without using bedrails?: Total Help needed moving from lying on your back to sitting on the side of a flat bed without using bedrails?: Total Help needed moving to  and from a bed to a chair (including a wheelchair)?: Total Help needed standing up from a chair using your arms (e.g., wheelchair or bedside chair)?: Total Help needed to walk in hospital room?: Total Help needed climbing 3-5 steps with a railing? : Total 6 Click Score: 6    End of Session   Activity Tolerance: Patient limited by pain;Patient limited by fatigue Patient left: with nursing/sitter in room (RN and NT to change out her bed) Nurse Communication: Mobility status;Need for lift equipment PT Visit Diagnosis: Muscle weakness (generalized) (M62.81);History of falling (Z91.81)     Time: 8372-8297 PT Time Calculation (min) (ACUTE ONLY): 35 min  Charges:    $Therapeutic Activity: 23-37 mins PT General Charges $$ ACUTE PT VISIT: 1 Visit                    Donna PT, DPT Physical Therapist Acute Rehabilitation Services Office: [phone]    Donna Sexton 07/24/2023, 5:13 PM

## 2023-07-24 NOTE — TOC Initial Note (Signed)
 Transition of Care [facility]) - Initial/Assessment Note   Patient Details  Name: Donna Sexton MRN: [id number] Date of Birth: 04-07-1950  [facility]) CM/SW Contact:    Duwaine GORMAN Aran, LCSW Phone Number: 07/24/2023, 9:48 AM  Clinical Narrative: CSW received information for peer to peer as the initial SNF request was denied by insurance. However, CSW notified by hospitalist that patient is not yet medically ready for SNF. CSW called [facility] and spoke with Niels to cancel the initial rehab request. Reference ID # is: [id number]. Insurance authorization to be submitted again once patient is closer to medical readiness provided the discharge plan does not change.  Expected Discharge Plan: Skilled Nursing Facility Barriers to Discharge: Continued Medical Work up  Patient Goals and CMS Choice Patient states their goals for this hospitalization and ongoing recovery are:: Rehab  Expected Discharge Plan and Services In-house Referral: Clinical Social Work Post Acute Care Choice: Skilled Nursing Facility Living arrangements for the past 2 months: Apartment           DME Arranged: N/A DME Agency: NA  Prior Living Arrangements/Services Living arrangements for the past 2 months: Apartment Lives with:: Other (Comment) (Ex-husband) Patient language and need for interpreter reviewed:: Yes Do you feel safe going back to the place where you live?: Yes      Need for Family Participation in Patient Care: Yes (Comment) Care giver support system in place?: Yes (comment) Current home services: DME (Rolling walker, cane) Criminal Activity/Legal Involvement Pertinent to Current Situation/Hospitalization: No - Comment as needed  Activities of Daily Living ADL Screening (condition at time of admission) Independently performs ADLs?: No Does the patient have a NEW difficulty with bathing/dressing/toileting/self-feeding that is expected to last >3 days?: Yes (Initiates electronic notice to provider  for possible OT consult) Does the patient have a NEW difficulty with getting in/out of bed, walking, or climbing stairs that is expected to last >3 days?: Yes (Initiates electronic notice to provider for possible PT consult) Does the patient have a NEW difficulty with communication that is expected to last >3 days?: No Is the patient deaf or have difficulty hearing?: No Does the patient have difficulty seeing, even when wearing glasses/contacts?: No Does the patient have difficulty concentrating, remembering, or making decisions?: No  Emotional Assessment Orientation: : Oriented to Self, Oriented to Place, Oriented to  Time, Oriented to Situation Alcohol / Substance Use: Not Applicable Psych Involvement: No (comment)  Admission diagnosis:  Left lower lobe pneumonia [J18.9] Acute cystitis without hematuria [N30.00] Generalized weakness [R53.1] Fall, initial encounter [W19.XXXA] Community acquired pneumonia of left lower lobe of lung [J18.9] Patient Active Problem List   Diagnosis Date Noted   Left lower lobe pneumonia 07/21/2023   Paroxysmal atrial fibrillation (HCC) 07/21/2023   Class 3 obesity 07/21/2023   Fall 07/21/2023   Physical deconditioning 07/21/2023   Concern about memory 04/14/2023   Tremor 04/14/2023   Encounter for monitoring amiodarone  therapy 03/21/2023   Hypercoagulable state due to persistent atrial fibrillation (HCC) 03/21/2023   Constipation 03/10/2023   Prediabetes 03/09/2023   Pure hypercholesterolemia 03/09/2023   COVID-19 03/09/2023   Urinary tract infectious disease 03/09/2023   V-tach (HCC) 03/01/2023   Atrial fibrillation with rapid ventricular response (HCC) 03/01/2023   Senile purpura (HCC) 02/16/2023   Palpitations 02/04/2022   DDD (degenerative disc disease), cervical 02/05/2021   Chronic heart failure with preserved ejection fraction (HCC) 05/16/2020   Impaired glucose tolerance 10/10/2019   Morbid obesity (HCC) 10/09/2019   Tobacco non-user  02/24/2019   Acute pain of right shoulder 02/23/2018   GAD (generalized anxiety disorder) 12/20/2017   Primary osteoarthritis of both knees 01/23/2017   Localized, primary osteoarthritis of hand, left 01/23/2017   Lumbar radiculitis 09/07/2015   IBS (irritable bowel syndrome) 08/07/2015   Elevated LFTs 05/18/2015   Hyperglycemia 05/12/2015   Lumbago 11/05/2014   Basal cell papilloma 09/11/2014   Restless leg 04/16/2014   Muscle spasm 02/27/2014   Overactive bladder 04/09/2013   Abnormal mammogram 06/22/2012   Bipolar disorder (HCC) 09/29/2011   Hyperlipidemia 09/29/2011   Essential hypertension 09/29/2011   Telogen effluvium 08/16/2011   Calcium  blood increased 01/11/2011   Clinical depression 10/04/2010   Anal bleeding 10/04/2010   Cannot sleep 10/04/2010   GERD 07/28/2009   COMMON MIGRAINE 12/29/2008   OSTEOPENIA 10/30/2007   PCP:  Alvan Dorothyann BIRCH, MD Pharmacy:   [facility] [place], [place] - [address] [place] [address] Phone: [phone] Fax: [phone]  [facility] [address] [place] [address] Phone: [phone] Fax: [phone]  Social Drivers of Health (SDOH) Social History: SDOH Screenings   Food Insecurity: No Food Insecurity (07/21/2023)  Recent Concern: Food Insecurity - Food Insecurity Present (06/26/2023)  Housing: Low Risk  (07/21/2023)  Transportation Needs: No Transportation Needs (07/21/2023)  Utilities: Not At Risk (07/21/2023)  Alcohol Screen: Low Risk  (03/28/2022)  Depression (PHQ2-9): Medium Risk (06/26/2023)  Financial Resource Strain: Low Risk  (06/26/2023)  Physical Activity: Inactive (06/26/2023)  Social Connections: Socially Isolated (07/21/2023)  Stress: Stress Concern Present (06/26/2023)  Tobacco Use: Low Risk  (07/19/2023)   SDOH Interventions:    Readmission Risk Interventions     No data to display

## 2023-07-25 DIAGNOSIS — J189 Pneumonia, unspecified organism: Secondary | ICD-10-CM | POA: Diagnosis not present

## 2023-07-25 DIAGNOSIS — W19XXXA Unspecified fall, initial encounter: Secondary | ICD-10-CM | POA: Diagnosis not present

## 2023-07-25 DIAGNOSIS — N3000 Acute cystitis without hematuria: Secondary | ICD-10-CM | POA: Diagnosis not present

## 2023-07-25 LAB — CULTURE, BLOOD (ROUTINE X 2): Culture: NO GROWTH

## 2023-07-25 LAB — CBC
HCT: 26.8 % — ABNORMAL LOW (ref 36.0–46.0)
Hemoglobin: 9.1 g/dL — ABNORMAL LOW (ref 12.0–15.0)
MCH: 33.7 pg (ref 26.0–34.0)
MCHC: 34 g/dL (ref 30.0–36.0)
MCV: 99.3 fL (ref 80.0–100.0)
Platelets: 113 K/uL — ABNORMAL LOW (ref 150–400)
RBC: 2.7 MIL/uL — ABNORMAL LOW (ref 3.87–5.11)
RDW: 12.6 % (ref 11.5–15.5)
WBC: 5 K/uL (ref 4.0–10.5)
nRBC: 0 % (ref 0.0–0.2)

## 2023-07-25 MED ORDER — LACTATED RINGERS IV SOLN: INTRAVENOUS | Status: AC

## 2023-07-25 NOTE — Plan of Care (Signed)

## 2023-07-25 NOTE — Progress Notes (Signed)
 Occupational Therapy Treatment Patient Details Name: Donna Sexton MRN: [id number] DOB: [birth year] Today's Date: 07/25/2023   History of present illness 73 y.o. female admitted 07/19/23 after multiple falls, weakness.AMS, lethargy, weakness,  UTI. PMhx: morbid obesity, OSA, DM2, HTN, HLD, CHF, Afib, GERD, chronic anemia, migraine, bipolar disorder, arthritis, asthma   OT comments  Patient seen for skilled OT session this afternoon. Nursing bedside and just repositioned following bathing and linen change thus patient declined OOB encouragement. Focus of session on self feeding and grooming retraining using modified spoon and toothbrush with built up foam handles as well as proximal support of B Ue's to increase control distally with + results. Issued both and instructed in use for meals and grooming. Issued yellow foam grasp cube and trained in use for B hand isolated digit ROM and intrinsic strength  with R>L response. White board updated for carryover between sessions. Patient continues to require Acute OT services to progress function and allow for safe discharge. Patient will benefit from continued inpatient follow up therapy, <3 hours/day.        If plan is discharge home, recommend the following:  Two people to help with walking and/or transfers;A lot of help with bathing/dressing/bathroom;Assistance with cooking/housework;Assistance with feeding;Direct supervision/assist for medications management;Direct supervision/assist for financial management;Assist for transportation;Help with stairs or ramp for entrance;Supervision due to cognitive status   Equipment Recommendations  Other (comment) (TBA after rehab venue)       Precautions / Restrictions Precautions Precautions: Fall Precaution/Restrictions Comments: significant  bilateral UE tremors with sitting.  Unable to control.  Currently on 4L O2 [place] Restrictions Weight Bearing Restrictions Per Provider Order: No       Mobility Bed  Mobility Overal bed mobility:  (nursing just completed bathing and bed linen change thus patient refused all mobility due to pain and fatigue)                  Transfers  Remained bed level (see above)                            ADL either performed or assessed with clinical judgement   ADL Overall ADL's : Needs assistance/impaired Eating/Feeding: Moderate assistance;Bed level;With adaptive utensils Eating/Feeding Details (indicate cue type and reason): focus of session on self feeding compensatory strategies and utensil build up with + results with R UE and proximal pillow support [place] Grooming: Oral care;Minimal assistance Grooming Details (indicate cue type and reason): built up tooth brush with red foam to allow improved grasp with R UE Upper Body Bathing: Maximal assistance;Bed level Upper Body Bathing Details (indicate cue type and reason): bed level Lower Body Bathing: Total assistance;+2 for physical assistance;+2 for safety/equipment;Bed level   Upper Body Dressing : Maximal assistance;Bed level   Lower Body Dressing: Total assistance;+2 for physical assistance;+2 for safety/equipment;Bed level                 General ADL Comments: improved R Ue hand use with modified utensil and tooth brush R UE    Extremity/Trunk Assessment Upper Extremity Assessment Upper Extremity Assessment: Generalized weakness RUE Deficits / Details: Significant BUE tremors noted.  Pt also protective of LUE.  L hand with deficits pt states from fall with edema and limited isolated finger flexion/exten sion LUE Deficits / Details: +2-3 edema L hand with 1/4 grasp max   Lower Extremity Assessment Lower Extremity Assessment: Defer to PT evaluation        Vision  Vision Assessment?: No apparent visual deficits         Communication Communication Communication: Impaired Factors Affecting Communication: Reduced clarity of speech;Difficulty expressing self   Cognition  Arousal: Alert Behavior During Therapy: Flat affect Cognition: Cognition impaired   Orientation impairments: Time Awareness: Online awareness impaired Memory impairment (select all impairments): Short-term memory Attention impairment (select first level of impairment): Sustained attention Executive functioning impairment (select all impairments): Problem solving, Reasoning OT - Cognition Comments: slower processing overall                 Following commands: Impaired Following commands impaired: Follows one step commands inconsistently      Cueing   Cueing Techniques: Verbal cues, Tactile cues  Exercises Exercises:  (yellow foam cube for gross grasp and isolated finger ROM Bly)       General Comments L UE with +2-3 edema with ACE wrap and roll in place to stabilize IV, LE's and L UE elevated with pillow props    Pertinent Vitals/ Pain       Pain Assessment Pain Assessment: Faces Faces Pain Scale: Hurts worst Breathing: occasional labored breathing, short period of hyperventilation Negative Vocalization: occasional moan/groan, low speech, negative/disapproving quality Facial Expression: sad, frightened, frown Body Language: tense, distressed pacing, fidgeting Consolability: distracted or reassured by voice/touch PAINAD Score: 5 Pain Location: sensitive to all touch Pain Descriptors / Indicators: Tender, Sharp Pain Intervention(s): Repositioned, Relaxation, Monitored during session, Premedicated before session, Limited activity within patient's tolerance   Frequency  Min 2X/week        Progress Toward Goals  OT Goals(current goals can now be found in the care plan section)  Progress towards OT goals: Progressing toward goals  Acute Rehab OT Goals Patient Stated Goal: to feed myself OT Goal Formulation: With patient Time For Goal Achievement: 08/05/23 Potential to Achieve Goals: Good ADL Goals Pt Will Perform Eating: with min assist;sitting Pt Will Perform  Grooming: with min assist Pt Will Transfer to Toilet: with max assist;bedside commode;with +2 assist Pt Will Perform Toileting - Clothing Manipulation and hygiene: with 2+ total assist;with max assist  Plan         AM-PAC OT 6 Clicks Daily Activity     Outcome Measure   Help from another person eating meals?: A Lot Help from another person taking care of personal grooming?: A Lot Help from another person toileting, which includes using toliet, bedpan, or urinal?: Total Help from another person bathing (including washing, rinsing, drying)?: Total Help from another person to put on and taking off regular upper body clothing?: Total Help from another person to put on and taking off regular lower body clothing?: Total 6 Click Score: 8    End of Session Equipment Utilized During Treatment: Oxygen  OT Visit Diagnosis: Muscle weakness (generalized) (M62.81);Pain Pain - part of body:  (all over)   Activity Tolerance Patient limited by pain;Patient limited by fatigue   Patient Left in bed;with call bell/phone within reach   Nurse Communication Other (comment) (use of built up foam on utensils and pillow prop for improved distal control)        Time: 1355-1431 OT Time Calculation (min): 36 min  Charges: OT General Charges $OT Visit: 1 Visit OT Treatments $Self Care/Home Management : 23-37 mins  Yulianna Folse OT/L Acute Rehabilitation Department  [phone]  07/25/2023, 3:56 PM

## 2023-07-25 NOTE — Progress Notes (Signed)
 Progress Note   Patient: Donna Sexton FMW:[id number] DOB: April 17, 1950 DOA: 07/19/2023     4 DOS: the patient was seen and examined on 07/25/2023   Brief hospital course: 73 y.o. female with medical history significant of respiratory failure due to COVID-19, anemia, anxiety, osteoarthritis, asthma, bipolar disorder, CHF,, migraine, encephalopathy, GERD, hyperlipidemia, hypertension, osteopenia, overactive bladder who presented to the emergency department with a fall with benign workup and was awaiting placement for SNF but subsequently became hypoxic mildly dyspneic.  Imaging revealed left lower lobe infiltrate. She denied fever, chills, rhinorrhea, sore throat, wheezing or hemoptysis.  No chest pain, palpitations, diaphoresis, PND, orthopnea or pitting edema of the lower extremities.  No abdominal pain, nausea, emesis, diarrhea, constipation, melena or hematochezia.  No flank pain, dysuria, frequency or hematuria.  No polyuria, polydipsia, polyphagia or blurred vision.   Assessment and Plan: Principal Problem:   Left lower lobe pneumonia -Pt noted to have developed pneumonia while in ED awaiting placement to SNF with resultant hypoxemia -Pt was started on ceftriaxone  with azithromycin  for CAP -strep pneumoniae urinary antigen pending -blood cx without growth thus far -on 7/12, noted to remain lethargic with generalized tremors and remained lethargic through 7/13 -now continued on cefepime  for HCAP coverage. MRSA swab neg, thus vanc d/c'd -repeat CXR 7/13 again shows PNA -on [facility]  Likely UTI  -UA reviewed, consistent with UTI in the setting of lethargy -Urine cx with multiple species present -cont cefepime  per above   Active Problems:   Chronic heart failure with preserved ejection fraction (HCC) Stopedp lasix  given very limited PO intake and concerns of dehydration Continue metoprolol  25 mg p.o. twice daily     Essential hypertension Continue diltiazem  180 mg p.o. daily. Continue  metoprolol  25 mg p.o. twice daily. Continue amlodipine  2.5 mg p.o. daily.     Paroxysmal atrial fibrillation (HCC) CHA?DS?-VASc Score of at least 5. Continue amiodarone  200 mg p.o. daily. Continue diltiazem  180 mg p.o. daily. Continue metoprolol  25 mg p.o. twice daily. Continue apixaban  5 mg p.o. twice daily.     GERD Antiacid, H2 blocker or PPI as needed.     Hyperlipidemia Continue atorvastatin  40 mg p.o. daily.     Prediabetes Currently euglycemic.    Class 3 obesity Current BMI 44.63 kg/m. Would benefit from lifestyle modifications.     Fall   Physical deconditioning  PT/OT with recs for SNF TOC following  Toxic metabolic encephalopathy secondary to Pneumonia, polypharmacy and dehydration -Was very conversant before admit, presented very lethargic, difficult to stay arouse - head CT. Neg -Ammonia normal -Mentation improved after holding lyrica  and oxy -resumed lower dose of lyrica  today -Pt noted to be more lethargic this AM.   Dehydration -Small amount of concentrated appearing urine noted. Pt not taking much PO secondary to lethargy. Suspect dehydration. Will cont IVF  Code status -Confirmed with family, Code Status is FULL   Subjective: Noted to be more lethargic and difficult to arouse this AM  Physical Exam: Vitals:   07/24/23 1532 07/24/23 1957 07/25/23 0506 07/25/23 0819  BP: 132/80 (!) 157/75 127/71   Pulse: 90 82 64   Resp: 16 18 16    Temp: 98 F (36.7 C) 99.1 F (37.3 C) 98.7 F (37.1 C)   TempSrc:  Oral Oral   SpO2: 98% 100% 100% 98%  Weight:      Height:       General exam: Conversant, in no acute distress Respiratory system: normal chest rise, clear, no audible wheezing Cardiovascular system: regular  rhythm, s1-s2 Gastrointestinal system: Nondistended, nontender, pos BS Central nervous system: No seizures, no tremors Extremities: No cyanosis, no joint deformities Skin: No rashes, no pallor Psychiatry: Affect normal // no auditory  hallucinations   Data Reviewed:  There are no new results to review at this time.  Family Communication: Pt in room, family at bedside  Disposition: Status is: Inpatient Remains inpatient appropriate because: severity of illness  Planned Discharge Destination: Skilled nursing facility    Author: Garnette Pelt, MD 07/25/2023 6:31 PM  For on call review [email or web address].

## 2023-07-26 DIAGNOSIS — G9341 Metabolic encephalopathy: Secondary | ICD-10-CM

## 2023-07-26 DIAGNOSIS — I10 Essential (primary) hypertension: Secondary | ICD-10-CM

## 2023-07-26 DIAGNOSIS — I5032 Chronic diastolic (congestive) heart failure: Secondary | ICD-10-CM | POA: Diagnosis not present

## 2023-07-26 DIAGNOSIS — R531 Weakness: Secondary | ICD-10-CM

## 2023-07-26 DIAGNOSIS — E785 Hyperlipidemia, unspecified: Secondary | ICD-10-CM

## 2023-07-26 DIAGNOSIS — J189 Pneumonia, unspecified organism: Secondary | ICD-10-CM | POA: Diagnosis not present

## 2023-07-26 DIAGNOSIS — R7303 Prediabetes: Secondary | ICD-10-CM

## 2023-07-26 DIAGNOSIS — K219 Gastro-esophageal reflux disease without esophagitis: Secondary | ICD-10-CM | POA: Diagnosis not present

## 2023-07-26 DIAGNOSIS — W19XXXD Unspecified fall, subsequent encounter: Secondary | ICD-10-CM | POA: Diagnosis not present

## 2023-07-26 DIAGNOSIS — E66813 Obesity, class 3: Secondary | ICD-10-CM

## 2023-07-26 DIAGNOSIS — I480 Paroxysmal atrial fibrillation: Secondary | ICD-10-CM

## 2023-07-26 LAB — CBC
HCT: 27.1 % — ABNORMAL LOW (ref 36.0–46.0)
Hemoglobin: 8.9 g/dL — ABNORMAL LOW (ref 12.0–15.0)
MCH: 33.8 pg (ref 26.0–34.0)
MCHC: 32.8 g/dL (ref 30.0–36.0)
MCV: 103 fL — ABNORMAL HIGH (ref 80.0–100.0)
Platelets: 119 K/uL — ABNORMAL LOW (ref 150–400)
RBC: 2.63 MIL/uL — ABNORMAL LOW (ref 3.87–5.11)
RDW: 12.7 % (ref 11.5–15.5)
WBC: 5.5 K/uL (ref 4.0–10.5)
nRBC: 0 % (ref 0.0–0.2)

## 2023-07-26 LAB — COMPREHENSIVE METABOLIC PANEL WITH GFR
ALT: 15 U/L (ref 0–44)
AST: 20 U/L (ref 15–41)
Albumin: 2.4 g/dL — ABNORMAL LOW (ref 3.5–5.0)
Alkaline Phosphatase: 46 U/L (ref 38–126)
Anion gap: 8 (ref 5–15)
BUN: 17 mg/dL (ref 8–23)
CO2: 25 mmol/L (ref 22–32)
Calcium: 9.5 mg/dL (ref 8.9–10.3)
Chloride: 101 mmol/L (ref 98–111)
Creatinine, Ser: 0.62 mg/dL (ref 0.44–1.00)
GFR, Estimated: >60 mL/min (ref >60)
Glucose, Bld: 94 mg/dL (ref 70–99)
Potassium: 4.5 mmol/L (ref 3.5–5.1)
Sodium: 134 mmol/L — ABNORMAL LOW (ref 135–145)
Total Bilirubin: 0.6 mg/dL (ref 0.0–1.2)
Total Protein: 4.7 g/dL — ABNORMAL LOW (ref 6.5–8.1)

## 2023-07-26 MED ORDER — TRAMADOL HCL 50 MG PO TABS
50.0000 mg | ORAL_TABLET | Freq: Four times a day (QID) | ORAL | Status: DC | PRN
  Administered 2023-07-26 – 2023-07-27 (×3): 50 mg via ORAL
  Filled 2023-07-26 (×3): qty 1

## 2023-07-26 MED ORDER — LEVALBUTEROL HCL 0.63 MG/3ML IN NEBU: 0.6300 mg | INHALATION_SOLUTION | Freq: Four times a day (QID) | RESPIRATORY_TRACT | Status: DC | PRN

## 2023-07-26 MED ORDER — GERHARDT'S BUTT CREAM
TOPICAL_CREAM | Freq: Two times a day (BID) | CUTANEOUS | Status: DC
  Filled 2023-07-26: qty 60

## 2023-07-26 NOTE — Progress Notes (Signed)
 PROGRESS NOTE    Donna Sexton  FMW:[id number] DOB: 02/19/1950 DOA: 07/19/2023 PCP: Alvan Dorothyann BIRCH, MD    Chief Complaint  Patient presents with   Fall    Brief Narrative:  73 y.o. female with medical history significant of respiratory failure due to COVID-19, anemia, anxiety, osteoarthritis, asthma, bipolar disorder, CHF,, migraine, encephalopathy, GERD, hyperlipidemia, hypertension, osteopenia, overactive bladder who presented to the emergency department with a fall with benign workup and was awaiting placement for SNF but subsequently became hypoxic mildly dyspneic.  Imaging revealed left lower lobe infiltrate. She denied fever, chills, rhinorrhea, sore throat, wheezing or hemoptysis.  No chest pain, palpitations, diaphoresis, PND, orthopnea or pitting edema of the lower extremities.  No abdominal pain, nausea, emesis, diarrhea, constipation, melena or hematochezia.  No flank pain, dysuria, frequency or hematuria.  No polyuria, polydipsia, polyphagia or blurred vision.    Assessment & Plan:   Principal Problem:   Left lower lobe pneumonia Active Problems:   GERD   Hyperlipidemia   Essential hypertension   Chronic heart failure with preserved ejection fraction (HCC)   Paroxysmal atrial fibrillation (HCC)   Prediabetes   Class 3 obesity   Fall   Physical deconditioning   Generalized weakness   Acute metabolic encephalopathy  #1 left lower lobe pneumonia -Patient noted to have developed pneumonia while in the ED awaiting placement to SNF with resulting hypoxemia. - Blood cultures negative x 5 days. - MRSA PCR negative. - SARS coronavirus 2 by PCR negative, influenza A and B by PCR negative, RSV by PCR negative. - Patient noted on 7/12 to remain lethargic with generalized tremors remained lethargic through 7/13. - Patient initially was on IV Rocephin  and azithromycin  and antibiotic coverage broadened to IV cefepime  for HCAP. - Repeat chest x-ray 7/13 with pneumonia. -  Patient improved clinically wise, more alert.  On 2 L nasal cannula - Supportive care.  2.  Probable UTI -Urinalysis done consistent with UTI in the setting of lethargy. - Urine cultures with multiple species noted. - Continue IV cefepime .  3.  Chronic heart failure with preserved ejection fraction -Lasix  on hold due to limited oral intake and concerns for dehydration. - Continue metoprolol  25 mg twice daily.  4.  Hypertension -Continue diltiazem  180 mg daily - Continue metoprolol  25 mg twice daily - Continue amlodipine  2.5 mg daily.  5.  Paroxysmal A-fib -Continue diltiazem  180 mg daily, metoprolol  25 mg twice daily, amiodarone  200 mg daily for rate control. - Apixaban  for anticoagulation.  6.  GERD - Continue Pepcid .  7.  Hyperlipidemia -Continue statin.  8.  Prediabetes -Currently euglycemic.  9.  Class III obesity -BMI of 44.63 kg/m. - Lifestyle modification. - Outpatient follow-up with PCP.  10.  Toxic metabolic encephalopathy secondary to pneumonia, polypharmacy and dehydration -Patient noted to have been very conversant prior to admission, noted to have presented with lethargy, difficulty to stay awake. - CT head done negative for any acute abnormalities. - Ammonia within normal limits. - Mentation noted to improved after holding Lyrica  and OxyContin . - Lyrica  resumed at a lower dose. - Patient more alert today.  11.  Dehydration -IV fluids.  12.  Fall/physical deconditioning -Patient seen by PT/ OT who are recommending SNF placement.   DVT prophylaxis: Eliquis  Code Status: Full Family Communication: Updated patient.  No family at bedside. Disposition: SNF when bed available.  Status is: Inpatient Remains inpatient appropriate because: Severity of illness   Consultants:  None  Procedures:  CT head CT C-spine 07/19/2023  CT head 07/23/2023  Antimicrobials:  Anti-infectives (From admission, onward)    Start     Dose/Rate Route Frequency Ordered  Stop   07/23/23 1800  vancomycin  (VANCOREADY) IVPB 1250 mg/250 mL  Status:  Discontinued       Placed in Followed by Linked Group   1,250 mg 166.7 mL/hr over 90 Minutes Intravenous Every 24 hours 07/22/23 1551 07/24/23 1027   07/22/23 1800  vancomycin  (VANCOREADY) IVPB 2000 mg/400 mL       Placed in Followed by Linked Group   2,000 mg 200 mL/hr over 120 Minutes Intravenous  Once 07/22/23 1551 07/22/23 2200   07/22/23 1700  ceFEPIme  (MAXIPIME ) 2 g in sodium chloride  0.9 % 100 mL IVPB        2 g 200 mL/hr over 30 Minutes Intravenous Every 8 hours 07/22/23 1551     07/22/23 1400  cefTRIAXone  (ROCEPHIN ) 2 g in sodium chloride  0.9 % 100 mL IVPB  Status:  Discontinued        2 g 200 mL/hr over 30 Minutes Intravenous Every 24 hours 07/21/23 1446 07/22/23 1529   07/21/23 1530  azithromycin  (ZITHROMAX ) 500 mg in sodium chloride  0.9 % 250 mL IVPB  Status:  Discontinued        500 mg 250 mL/hr over 60 Minutes Intravenous Every 24 hours 07/21/23 1446 07/22/23 1529   07/21/23 1415  cefTRIAXone  (ROCEPHIN ) 2 g in sodium chloride  0.9 % 100 mL IVPB        2 g 200 mL/hr over 30 Minutes Intravenous  Once 07/21/23 1407 07/21/23 1536   07/20/23 1000  cephALEXin  (KEFLEX ) capsule 500 mg  Status:  Discontinued        500 mg Oral 2 times daily 07/19/23 2125 07/21/23 1442   07/19/23 2200  cephALEXin  (KEFLEX ) capsule 500 mg  Status:  Discontinued        500 mg Oral 2 times daily 07/19/23 2125 07/19/23 2125   07/19/23 1915  cefTRIAXone  (ROCEPHIN ) 2 g in sodium chloride  0.9 % 100 mL IVPB        2 g 200 mL/hr over 30 Minutes Intravenous  Once 07/19/23 1913 07/19/23 2234         Subjective: Patient lying in bed.  Alert.  Denies any chest pain or shortness of breath.  No abdominal pain.  Overall feeling better and improving over the past 24 hours.  Objective: Vitals:   07/26/23 0628 07/26/23 0830 07/26/23 1155 07/26/23 2144  BP: 120/66  123/67 132/62  Pulse: 65  65 69  Resp: 14  19   Temp: 98.6 F (37  C)  99 F (37.2 C) 99.3 F (37.4 C)  TempSrc: Oral  Oral Oral  SpO2: 100% 100% 98% 96%  Weight:      Height:        Intake/Output Summary (Last 24 hours) at 07/26/2023 2153 Last data filed at 07/26/2023 9347 Gross per 24 hour  Intake 120 ml  Output --  Net 120 ml   Filed Weights   07/21/23 1640  Weight: 117.9 kg    Examination:  General exam: Appears calm and comfortable  Respiratory system: Clear to auscultation anterior lung disease, no crackles, no rhonchi.  Fair air movement.  Speaking in full sentences. Respiratory effort normal. Cardiovascular system: S1 & S2 heard, RRR. No JVD, murmurs, rubs, gallops or clicks. No pedal edema. Gastrointestinal system: Abdomen is nondistended, soft and nontender. No organomegaly or masses felt. Normal bowel sounds heard. Central nervous system: Alert and oriented.  No focal neurological deficits. Extremities: Symmetric 5 x 5 power. Skin: No rashes, lesions or ulcers Psychiatry: Judgement and insight appear normal. Mood & affect appropriate.     Data Reviewed: I have personally reviewed following labs and imaging studies  CBC: Recent Labs  Lab 07/21/23 1237 07/22/23 0540 07/23/23 0715 07/24/23 0506 07/25/23 0454 07/26/23 0522  WBC 5.8 4.6 5.1 4.6 5.0 5.5  NEUTROABS 3.3  --   --   --   --   --   HGB 10.0* 9.1* 9.3* 8.9* 9.1* 8.9*  HCT 30.3* 27.6* 28.5* 26.4* 26.8* 27.1*  MCV 102.0* 103.8* 103.3* 101.5* 99.3 103.0*  PLT 143* 113* 129* 121* 113* 119*    Basic Metabolic Panel: Recent Labs  Lab 07/21/23 1237 07/22/23 0540 07/23/23 0715 07/24/23 0506 07/26/23 0522  NA 136 136 138 138 134*  K 3.9 3.8 3.9 4.1 4.5  CL 96* 98 102 102 101  CO2 30 28 27 26 25   GLUCOSE 99 109* 132* 117* 94  BUN 17 19 21 16 17   CREATININE 0.87 0.90 0.80 0.61 0.62  CALCIUM  8.8* 9.0 8.9 9.1 9.5  MG  --  1.9  --   --   --   PHOS  --  4.6  --   --   --     GFR: Estimated Creatinine Clearance: 77.9 mL/min (by C-G formula based on SCr of 0.62  mg/dL).  Liver Function Tests: Recent Labs  Lab 07/22/23 0540 07/23/23 0715 07/24/23 0506 07/26/23 0522  AST 24 24 19 20   ALT 16 17 15 15   ALKPHOS 47 44 41 46  BILITOT 0.7 0.7 0.8 0.6  PROT 4.7* 4.9* 4.5* 4.7*  ALBUMIN 2.5* 2.5* 2.4* 2.4*    CBG: No results for input(s): GLUCAP in the last 168 hours.    Recent Results (from the past 240 hours)  Resp panel by RT-PCR (RSV, Flu A&B, Covid) Anterior Nasal Swab     Status: None   Collection Time: 07/19/23  6:10 PM   Specimen: Anterior Nasal Swab  Result Value Ref Range Status   SARS Coronavirus 2 by RT PCR NEGATIVE NEGATIVE Final    Comment: (NOTE) SARS-CoV-2 target nucleic acids are NOT DETECTED.  The SARS-CoV-2 RNA is generally detectable in upper respiratory specimens during the acute phase of infection. The lowest concentration of SARS-CoV-2 viral copies this assay can detect is 138 copies/mL. A negative result does not preclude SARS-Cov-2 infection and should not be used as the sole basis for treatment or other patient management decisions. A negative result may occur with  improper specimen collection/handling, submission of specimen other than nasopharyngeal swab, presence of viral mutation(s) within the areas targeted by this assay, and inadequate number of viral copies(<138 copies/mL). A negative result must be combined with clinical observations, patient history, and epidemiological information. The expected result is Negative.  Fact Sheet for Patients:  BloggerCourse.com  Fact Sheet for Healthcare Providers:  SeriousBroker.it  This test is no t yet approved or cleared by the [place]  FDA and  has been authorized for detection and/or diagnosis of SARS-CoV-2 by FDA under an Emergency Use Authorization (EUA). This EUA will remain  in effect (meaning this test can be used) for the duration of the COVID-19 declaration under Section 564(b)(1) of the Act,  21 U.S.C.section 360bbb-3(b)(1), unless the authorization is terminated  or revoked sooner.       Influenza A by PCR NEGATIVE NEGATIVE Final   Influenza B by PCR NEGATIVE NEGATIVE Final  Comment: (NOTE) The Xpert Xpress SARS-CoV-2/FLU/RSV plus assay is intended as an aid in the diagnosis of influenza from Nasopharyngeal swab specimens and should not be used as a sole basis for treatment. Nasal washings and aspirates are unacceptable for Xpert Xpress SARS-CoV-2/FLU/RSV testing.  Fact Sheet for Patients: BloggerCourse.com  Fact Sheet for Healthcare Providers: SeriousBroker.it  This test is not yet approved or cleared by the [place]  FDA and has been authorized for detection and/or diagnosis of SARS-CoV-2 by FDA under an Emergency Use Authorization (EUA). This EUA will remain in effect (meaning this test can be used) for the duration of the COVID-19 declaration under Section 564(b)(1) of the Act, 21 U.S.C. section 360bbb-3(b)(1), unless the authorization is terminated or revoked.     Resp Syncytial Virus by PCR NEGATIVE NEGATIVE Final    Comment: (NOTE) Fact Sheet for Patients: BloggerCourse.com  Fact Sheet for Healthcare Providers: SeriousBroker.it  This test is not yet approved or cleared by the [place]  FDA and has been authorized for detection and/or diagnosis of SARS-CoV-2 by FDA under an Emergency Use Authorization (EUA). This EUA will remain in effect (meaning this test can be used) for the duration of the COVID-19 declaration under Section 564(b)(1) of the Act, 21 U.S.C. section 360bbb-3(b)(1), unless the authorization is terminated or revoked.  Performed at [facility], [address] [place], [place] [address]   Blood culture (routine x 2)     Status: None   Collection Time: 07/19/23  8:33 PM   Specimen: BLOOD  Result Value Ref  Range Status   Specimen Description   Final    BLOOD LEFT ANTECUBITAL Performed at [facility], [address], [place], [place] [address]    Special Requests   Final    BOTTLES DRAWN AEROBIC AND ANAEROBIC Blood Culture results may not be optimal due to an inadequate volume of blood received in culture bottles Performed at [facility], [address], [place], [place] [address]    Culture   Final    NO GROWTH 5 DAYS Performed at [facility], [address], [place], [place] [address]    Report Status 07/24/2023 FINAL  Final  Urine Culture     Status: Abnormal   Collection Time: 07/19/23  8:55 PM   Specimen: Urine, Catheterized  Result Value Ref Range Status   Specimen Description   Final    URINE, CATHETERIZED Performed at [facility], [address], [place], [place] [address]    Special Requests   Final    NONE Performed at [facility], [address], [place], [place] [address]    Culture MULTIPLE SPECIES PRESENT, SUGGEST RECOLLECTION (A)  Final   Report Status 07/20/2023 FINAL  Final  Blood culture (routine x 2)     Status: None   Collection Time: 07/19/23 10:33 PM   Specimen: BLOOD  Result Value Ref Range Status   Specimen Description   Final    BLOOD RIGHT ANTECUBITAL Performed at [facility], [address] [place], [place] [address]    Special Requests   Final    BOTTLES DRAWN AEROBIC ONLY Blood Culture results may not be optimal due to an inadequate volume of blood received in culture bottles Performed at [facility], [address], [place], [place] [address]    Culture   Final    NO GROWTH 5 DAYS Performed at [facility], [address],  [place], [place] [address]    Report Status 07/25/2023 FINAL  Final  MRSA Next Gen by PCR, Nasal     Status: None   Collection Time: 07/23/23 10:03 PM   Specimen: Nasal Swab  Result Value Ref  Range Status   MRSA by PCR Next Gen NOT DETECTED NOT DETECTED Final    Comment: (NOTE) The GeneXpert MRSA Assay (FDA approved for NASAL specimens only), is one component of a comprehensive MRSA colonization surveillance program. It is not intended to diagnose MRSA infection nor to guide or monitor treatment for MRSA infections. Test performance is not FDA approved in patients less than 23 years old. Performed at [facility], [address], [place], [place] [address]          Radiology Studies: No results found.      Scheduled Meds:  amiodarone   200 mg Oral Daily   apixaban   5 mg Oral BID   atorvastatin   40 mg Oral Daily   diltiazem   180 mg Oral Daily   divalproex   1,500 mg Oral QHS   famotidine   20 mg Oral Daily   feeding supplement  237 mL Oral BID BM   fluticasone  furoate-vilanterol  1 puff Inhalation Daily   Gerhardt's butt cream   Topical BID   metoprolol  tartrate  25 mg Oral BID   oxybutynin   5 mg Oral BID   potassium chloride  SA  20 mEq Oral Daily   pregabalin   50 mg Oral BID   senna-docusate  2 tablet Oral QHS   Continuous Infusions:  ceFEPime  (MAXIPIME ) IV 2 g (07/26/23 1629)     LOS: 5 days    Time spent: 40 minutes    Toribio Hummer, MD [facility]   To contact the attending provider between 7A-7P or the covering provider during after hours 7P-7A, please log into the web site [email or web address] and access using universal [place] password for that web site. If you do not have the password, please call the hospital operator.  07/26/2023, 9:53 PM

## 2023-07-26 NOTE — TOC Progression Note (Signed)
 Transition of Care [facility]) - Progression Note   Patient Details  Name: Donna Sexton MRN: [id number] Date of Birth: 1950/12/28  [facility]) CM/SW Contact  Duwaine GORMAN Aran, LCSW Phone Number: 07/26/2023, 1:17 PM  Clinical Narrative: CSW followed up with [place] in admissions at [facility] and the facility is able to extend a bed offer. Insurance authorization will be started when patient is closer to medical readiness. CSW updated patient's son, Fonda Sluder, and ex-husband, Jordanne Elsbury, regarding [employer]. Care management to follow.  Expected Discharge Plan: Skilled Nursing Facility Barriers to Discharge: Continued Medical Work up  Expected Discharge Plan and Services In-house Referral: Clinical Social Work Post Acute Care Choice: Skilled Nursing Facility Living arrangements for the past 2 months: Apartment           DME Arranged: N/A DME Agency: NA  Social Determinants of Health (SDOH) Interventions SDOH Screenings   Food Insecurity: No Food Insecurity (07/21/2023)  Recent Concern: Food Insecurity - Food Insecurity Present (06/26/2023)  Housing: Low Risk  (07/21/2023)  Transportation Needs: No Transportation Needs (07/21/2023)  Utilities: Not At Risk (07/21/2023)  Alcohol Screen: Low Risk  (03/28/2022)  Depression (PHQ2-9): Medium Risk (06/26/2023)  Financial Resource Strain: Low Risk  (06/26/2023)  Physical Activity: Inactive (06/26/2023)  Social Connections: Socially Isolated (07/21/2023)  Stress: Stress Concern Present (06/26/2023)  Tobacco Use: Low Risk  (07/19/2023)   Readmission Risk Interventions     No data to display

## 2023-07-26 NOTE — Consult Note (Addendum)
 WOC Nurse Consult Note: compare to media 07/21/2023  Reason for Consult:inner R buttock wound  Wound type: full thickness appears to have been a skin tear  Moisture ASsociated Skin Damage to buttocks  ICD-10 CM Codes for Irritant Dermatitis L24A2 - Due to fecal, urinary or dual incontinence  Pressure Injury POA: NA not pressure  Measurement: see nursing flowsheet  Wound bed: 50% pink 50% yellow  Drainage (amount, consistency, odor) appears serosanguinous  Periwound: moisture related skin damage   Dressing procedure/placement/frequency: Cleanse buttocks with soap and water, dry and apply Xeroform gauze ([id number] [id number]) to R inner buttocks wound  daily and cover with silicone foam.   Will order Gerhardt's Butt Cream 2 times daily to surrounding intact skin of buttocks.    POC discussed with bedside nurse. WOC team will not follow. Re-consult if further needs arise.   Thank you,    Powell Bar MSN, RN-BC, [employer]

## 2023-07-27 DIAGNOSIS — N3281 Overactive bladder: Secondary | ICD-10-CM | POA: Diagnosis not present

## 2023-07-27 DIAGNOSIS — R531 Weakness: Secondary | ICD-10-CM | POA: Diagnosis not present

## 2023-07-27 DIAGNOSIS — I4891 Unspecified atrial fibrillation: Secondary | ICD-10-CM | POA: Diagnosis not present

## 2023-07-27 DIAGNOSIS — Z818 Family history of other mental and behavioral disorders: Secondary | ICD-10-CM | POA: Diagnosis not present

## 2023-07-27 DIAGNOSIS — J45909 Unspecified asthma, uncomplicated: Secondary | ICD-10-CM | POA: Diagnosis not present

## 2023-07-27 DIAGNOSIS — T50915A Adverse effect of multiple unspecified drugs, medicaments and biological substances, initial encounter: Secondary | ICD-10-CM | POA: Diagnosis not present

## 2023-07-27 DIAGNOSIS — F1311 Sedative, hypnotic or anxiolytic abuse, in remission: Secondary | ICD-10-CM | POA: Diagnosis not present

## 2023-07-27 DIAGNOSIS — I480 Paroxysmal atrial fibrillation: Secondary | ICD-10-CM | POA: Diagnosis not present

## 2023-07-27 DIAGNOSIS — R079 Chest pain, unspecified: Secondary | ICD-10-CM | POA: Diagnosis not present

## 2023-07-27 DIAGNOSIS — R41841 Cognitive communication deficit: Secondary | ICD-10-CM | POA: Diagnosis not present

## 2023-07-27 DIAGNOSIS — I5032 Chronic diastolic (congestive) heart failure: Secondary | ICD-10-CM | POA: Diagnosis not present

## 2023-07-27 DIAGNOSIS — B965 Pseudomonas (aeruginosa) (mallei) (pseudomallei) as the cause of diseases classified elsewhere: Secondary | ICD-10-CM | POA: Diagnosis not present

## 2023-07-27 DIAGNOSIS — I110 Hypertensive heart disease with heart failure: Secondary | ICD-10-CM | POA: Diagnosis not present

## 2023-07-27 DIAGNOSIS — R1311 Dysphagia, oral phase: Secondary | ICD-10-CM | POA: Diagnosis not present

## 2023-07-27 DIAGNOSIS — J181 Lobar pneumonia, unspecified organism: Secondary | ICD-10-CM | POA: Diagnosis not present

## 2023-07-27 DIAGNOSIS — I5033 Acute on chronic diastolic (congestive) heart failure: Secondary | ICD-10-CM | POA: Diagnosis not present

## 2023-07-27 DIAGNOSIS — G4700 Insomnia, unspecified: Secondary | ICD-10-CM | POA: Diagnosis not present

## 2023-07-27 DIAGNOSIS — J189 Pneumonia, unspecified organism: Secondary | ICD-10-CM | POA: Diagnosis not present

## 2023-07-27 DIAGNOSIS — R4182 Altered mental status, unspecified: Secondary | ICD-10-CM | POA: Diagnosis not present

## 2023-07-27 DIAGNOSIS — W19XXXA Unspecified fall, initial encounter: Secondary | ICD-10-CM | POA: Diagnosis not present

## 2023-07-27 DIAGNOSIS — B3731 Acute candidiasis of vulva and vagina: Secondary | ICD-10-CM | POA: Diagnosis not present

## 2023-07-27 DIAGNOSIS — R7303 Prediabetes: Secondary | ICD-10-CM | POA: Diagnosis not present

## 2023-07-27 DIAGNOSIS — M8580 Other specified disorders of bone density and structure, unspecified site: Secondary | ICD-10-CM | POA: Diagnosis not present

## 2023-07-27 DIAGNOSIS — M6281 Muscle weakness (generalized): Secondary | ICD-10-CM | POA: Diagnosis not present

## 2023-07-27 DIAGNOSIS — N3000 Acute cystitis without hematuria: Secondary | ICD-10-CM | POA: Diagnosis not present

## 2023-07-27 DIAGNOSIS — F319 Bipolar disorder, unspecified: Secondary | ICD-10-CM | POA: Diagnosis not present

## 2023-07-27 DIAGNOSIS — Z1152 Encounter for screening for COVID-19: Secondary | ICD-10-CM | POA: Diagnosis not present

## 2023-07-27 DIAGNOSIS — Z7901 Long term (current) use of anticoagulants: Secondary | ICD-10-CM | POA: Diagnosis not present

## 2023-07-27 DIAGNOSIS — J9601 Acute respiratory failure with hypoxia: Secondary | ICD-10-CM | POA: Diagnosis not present

## 2023-07-27 DIAGNOSIS — G9341 Metabolic encephalopathy: Secondary | ICD-10-CM | POA: Diagnosis not present

## 2023-07-27 DIAGNOSIS — G928 Other toxic encephalopathy: Secondary | ICD-10-CM | POA: Diagnosis not present

## 2023-07-27 DIAGNOSIS — M170 Bilateral primary osteoarthritis of knee: Secondary | ICD-10-CM | POA: Diagnosis not present

## 2023-07-27 DIAGNOSIS — D649 Anemia, unspecified: Secondary | ICD-10-CM | POA: Diagnosis not present

## 2023-07-27 DIAGNOSIS — R0602 Shortness of breath: Secondary | ICD-10-CM | POA: Diagnosis not present

## 2023-07-27 DIAGNOSIS — I10 Essential (primary) hypertension: Secondary | ICD-10-CM | POA: Diagnosis not present

## 2023-07-27 DIAGNOSIS — I509 Heart failure, unspecified: Secondary | ICD-10-CM | POA: Diagnosis not present

## 2023-07-27 DIAGNOSIS — B952 Enterococcus as the cause of diseases classified elsewhere: Secondary | ICD-10-CM | POA: Diagnosis not present

## 2023-07-27 DIAGNOSIS — Z7951 Long term (current) use of inhaled steroids: Secondary | ICD-10-CM | POA: Diagnosis not present

## 2023-07-27 DIAGNOSIS — E860 Dehydration: Secondary | ICD-10-CM | POA: Diagnosis not present

## 2023-07-27 DIAGNOSIS — E785 Hyperlipidemia, unspecified: Secondary | ICD-10-CM | POA: Diagnosis not present

## 2023-07-27 DIAGNOSIS — J9811 Atelectasis: Secondary | ICD-10-CM | POA: Diagnosis not present

## 2023-07-27 DIAGNOSIS — G894 Chronic pain syndrome: Secondary | ICD-10-CM | POA: Diagnosis not present

## 2023-07-27 DIAGNOSIS — G4733 Obstructive sleep apnea (adult) (pediatric): Secondary | ICD-10-CM | POA: Diagnosis not present

## 2023-07-27 DIAGNOSIS — K5903 Drug induced constipation: Secondary | ICD-10-CM | POA: Diagnosis not present

## 2023-07-27 DIAGNOSIS — N390 Urinary tract infection, site not specified: Secondary | ICD-10-CM | POA: Diagnosis not present

## 2023-07-27 DIAGNOSIS — R300 Dysuria: Secondary | ICD-10-CM | POA: Diagnosis not present

## 2023-07-27 DIAGNOSIS — K219 Gastro-esophageal reflux disease without esophagitis: Secondary | ICD-10-CM | POA: Diagnosis not present

## 2023-07-27 DIAGNOSIS — E66813 Obesity, class 3: Secondary | ICD-10-CM | POA: Diagnosis not present

## 2023-07-27 DIAGNOSIS — M5030 Other cervical disc degeneration, unspecified cervical region: Secondary | ICD-10-CM | POA: Diagnosis not present

## 2023-07-27 DIAGNOSIS — J9621 Acute and chronic respiratory failure with hypoxia: Secondary | ICD-10-CM | POA: Diagnosis not present

## 2023-07-27 DIAGNOSIS — R9082 White matter disease, unspecified: Secondary | ICD-10-CM | POA: Diagnosis not present

## 2023-07-27 DIAGNOSIS — U071 COVID-19: Secondary | ICD-10-CM | POA: Diagnosis not present

## 2023-07-27 DIAGNOSIS — Z6841 Body Mass Index (BMI) 40.0 and over, adult: Secondary | ICD-10-CM | POA: Diagnosis not present

## 2023-07-27 DIAGNOSIS — R5381 Other malaise: Secondary | ICD-10-CM

## 2023-07-27 DIAGNOSIS — Z885 Allergy status to narcotic agent status: Secondary | ICD-10-CM | POA: Diagnosis not present

## 2023-07-27 DIAGNOSIS — R0902 Hypoxemia: Secondary | ICD-10-CM | POA: Diagnosis not present

## 2023-07-27 DIAGNOSIS — Z23 Encounter for immunization: Secondary | ICD-10-CM | POA: Diagnosis present

## 2023-07-27 DIAGNOSIS — F411 Generalized anxiety disorder: Secondary | ICD-10-CM | POA: Diagnosis not present

## 2023-07-27 DIAGNOSIS — I517 Cardiomegaly: Secondary | ICD-10-CM | POA: Diagnosis not present

## 2023-07-27 DIAGNOSIS — Z7401 Bed confinement status: Secondary | ICD-10-CM | POA: Diagnosis not present

## 2023-07-27 DIAGNOSIS — R2689 Other abnormalities of gait and mobility: Secondary | ICD-10-CM | POA: Diagnosis not present

## 2023-07-27 DIAGNOSIS — Z79899 Other long term (current) drug therapy: Secondary | ICD-10-CM | POA: Diagnosis not present

## 2023-07-27 DIAGNOSIS — Z8249 Family history of ischemic heart disease and other diseases of the circulatory system: Secondary | ICD-10-CM | POA: Diagnosis not present

## 2023-07-27 DIAGNOSIS — F5105 Insomnia due to other mental disorder: Secondary | ICD-10-CM | POA: Diagnosis not present

## 2023-07-27 DIAGNOSIS — F3130 Bipolar disorder, current episode depressed, mild or moderate severity, unspecified: Secondary | ICD-10-CM | POA: Diagnosis not present

## 2023-07-27 DIAGNOSIS — D539 Nutritional anemia, unspecified: Secondary | ICD-10-CM | POA: Diagnosis not present

## 2023-07-27 LAB — BASIC METABOLIC PANEL WITH GFR
Anion gap: 6 (ref 5–15)
BUN: 17 mg/dL (ref 8–23)
CO2: 28 mmol/L (ref 22–32)
Calcium: 9.2 mg/dL (ref 8.9–10.3)
Chloride: 102 mmol/L (ref 98–111)
Creatinine, Ser: 0.56 mg/dL (ref 0.44–1.00)
GFR, Estimated: >60 mL/min (ref >60)
Glucose, Bld: 96 mg/dL (ref 70–99)
Potassium: 4.3 mmol/L (ref 3.5–5.1)
Sodium: 136 mmol/L (ref 135–145)

## 2023-07-27 LAB — CBC
HCT: 24.8 % — ABNORMAL LOW (ref 36.0–46.0)
Hemoglobin: 8.3 g/dL — ABNORMAL LOW (ref 12.0–15.0)
MCH: 33.5 pg (ref 26.0–34.0)
MCHC: 33.5 g/dL (ref 30.0–36.0)
MCV: 100 fL (ref 80.0–100.0)
Platelets: 127 K/uL — ABNORMAL LOW (ref 150–400)
RBC: 2.48 MIL/uL — ABNORMAL LOW (ref 3.87–5.11)
RDW: 12.5 % (ref 11.5–15.5)
WBC: 5.3 K/uL (ref 4.0–10.5)
nRBC: 0 % (ref 0.0–0.2)

## 2023-07-27 MED ORDER — AMOXICILLIN-POT CLAVULANATE 875-125 MG PO TABS: 1.0000 | ORAL_TABLET | Freq: Two times a day (BID) | ORAL | 0 refills | Status: AC

## 2023-07-27 MED ORDER — GERHARDT'S BUTT CREAM: 1.0000 | TOPICAL_CREAM | Freq: Two times a day (BID) | CUTANEOUS | Status: DC

## 2023-07-27 MED ORDER — ENSURE PLUS HIGH PROTEIN PO LIQD: 237.0000 mL | Freq: Two times a day (BID) | ORAL | Status: DC

## 2023-07-27 MED ORDER — FUROSEMIDE 40 MG PO TABS
40.0000 mg | ORAL_TABLET | Freq: Every day | ORAL | Status: DC
  Administered 2023-07-27: 40 mg via ORAL
  Filled 2023-07-27: qty 1

## 2023-07-27 MED ORDER — ONDANSETRON HCL 4 MG PO TABS: 4.0000 mg | ORAL_TABLET | Freq: Four times a day (QID) | ORAL | 0 refills | Status: DC | PRN

## 2023-07-27 MED ORDER — TRAMADOL HCL 50 MG PO TABS: 50.0000 mg | ORAL_TABLET | Freq: Four times a day (QID) | ORAL | 0 refills | Status: DC | PRN

## 2023-07-27 MED ORDER — PREGABALIN 50 MG PO CAPS: 50.0000 mg | ORAL_CAPSULE | Freq: Two times a day (BID) | ORAL | Status: DC

## 2023-07-27 NOTE — Progress Notes (Signed)
 Occupational Therapy Treatment Patient Details Name: Donna Sexton MRN: [id number] DOB: 1950-08-06 Today's Date: 07/27/2023   History of present illness 73 y.o. female admitted 07/19/23 after multiple falls, weakness.AMS, lethargy, weakness,  UTI. PMhx: morbid obesity, OSA, DM2, HTN, HLD, CHF, Afib, GERD, chronic anemia, migraine, bipolar disorder, arthritis, asthma   OT comments  Pt seen for OT tx. Nursing repositioning pt following bed level ADLs with lunch tray arriving. OT provides edu and instruction on compensatory strategies with pt demonstrating poor carryover from previous sessions, often tangential and requiring redirection to task. Pt falling asleep mid-sentence and declining to further attempt to eat lunch. RN updated. Discharge recommendation appropriate, patient will benefit from continued inpatient follow up therapy, <3 hours/day       If plan is discharge home, recommend the following:  Two people to help with walking and/or transfers;A lot of help with bathing/dressing/bathroom;Assistance with cooking/housework;Assistance with feeding;Direct supervision/assist for medications management;Direct supervision/assist for financial management;Assist for transportation;Help with stairs or ramp for entrance;Supervision due to cognitive status   Equipment Recommendations  Other (comment)    Recommendations for Other Services      Precautions / Restrictions Precautions Precautions: Fall Restrictions Weight Bearing Restrictions Per Provider Order: No       Mobility Bed Mobility Overal bed mobility: Needs Assistance             General bed mobility comments: NT, pt recently performing bed level care with nursing staff    Transfers Overall transfer level: Needs assistance                 General transfer comment: NT     Balance Overall balance assessment: Needs assistance                                         ADL either performed or  assessed with clinical judgement   ADL Overall ADL's : Needs assistance/impaired Eating/Feeding: Bed level;Minimal assistance;With adaptive utensils Eating/Feeding Details (indicate cue type and reason): session focused on self-feeding with built-up utentsils, pt with poor attention and ability to process task peformance. able to initiate grasp on fork but cues to reach for food on plate. pt often falling asleep mid-sentence                                         Praxis     Communication Communication Communication: Impaired Factors Affecting Communication: Reduced clarity of speech;Difficulty expressing self   Cognition Arousal: Alert Behavior During Therapy: Flat affect Cognition: Cognition impaired   Orientation impairments: Time Awareness: Online awareness impaired Memory impairment (select all impairments): Short-term memory Attention impairment (select first level of impairment): Sustained attention Executive functioning impairment (select all impairments): Problem solving, Reasoning OT - Cognition Comments: slower processing overall, pt talkative and requires cues for attention to task                 Following commands: Impaired Following commands impaired: Follows one step commands inconsistently      Cueing   Cueing Techniques: Verbal cues, Tactile cues  Exercises              Pertinent Vitals/ Pain       Pain Assessment Pain Assessment: No/denies pain Pain Score: 0-No pain   Frequency  Min 2X/week  Progress Toward Goals  OT Goals(current goals can now be found in the care plan section)     Acute Rehab OT Goals OT Goal Formulation: With patient Time For Goal Achievement: 08/05/23 Potential to Achieve Goals: Good ADL Goals Pt Will Perform Eating: with min assist;sitting Pt Will Perform Grooming: with min assist Pt Will Transfer to Toilet: with max assist;bedside commode;with +2 assist Pt Will Perform Toileting -  Clothing Manipulation and hygiene: with 2+ total assist;with max assist  Plan         AM-PAC OT 6 Clicks Daily Activity     Outcome Measure   Help from another person eating meals?: A Lot Help from another person taking care of personal grooming?: A Lot Help from another person toileting, which includes using toliet, bedpan, or urinal?: Total Help from another person bathing (including washing, rinsing, drying)?: Total Help from another person to put on and taking off regular upper body clothing?: Total Help from another person to put on and taking off regular lower body clothing?: Total 6 Click Score: 8    End of Session Equipment Utilized During Treatment: Oxygen  OT Visit Diagnosis: Muscle weakness (generalized) (M62.81);Pain   Activity Tolerance Patient limited by fatigue   Patient Left in bed;with call bell/phone within reach;with family/visitor present;with bed alarm set   Nurse Communication Other (comment)        Time: 8795-8784 OT Time Calculation (min): 11 min  Charges: OT General Charges $OT Visit: 1 Visit OT Treatments $Self Care/Home Management : 8-22 mins  Ramar Nobrega L. Dewey Viens, OTR/L  07/27/23, 12:23 PM

## 2023-07-27 NOTE — Discharge Summary (Signed)
 Physician Discharge Summary  Donna Sexton FMW:[id number] DOB: [birth year] DOA: 07/19/2023  PCP: Alvan Dorothyann BIRCH, MD  Admit date: 07/19/2023 Discharge date: 07/27/2023  Time spent: 60 minutes  Recommendations for Outpatient Follow-up:  Follow-up with MD at skilled nursing facility.  Patient will need a basic metabolic profile done in 1 week to follow-up on electrolytes and renal function.   Discharge Diagnoses:  Principal Problem:   Left lower lobe pneumonia Active Problems:   GERD   Hyperlipidemia   Essential hypertension   Chronic heart failure with preserved ejection fraction (HCC)   Paroxysmal atrial fibrillation (HCC)   Prediabetes   Class 3 obesity   Fall   Physical deconditioning   Generalized weakness   Acute metabolic encephalopathy   Discharge Condition: Stable and improved.  Diet recommendation: Heart healthy  Filed Weights   07/21/23 1640  Weight: 117.9 kg    History of present illness:  HPI per Dr. Celinda Verneita LITTIE Search is a 73 y.o. female with medical history significant of respiratory failure due to COVID-19, anemia, anxiety, osteoarthritis, asthma, bipolar disorder, CHF,, migraine, encephalopathy, GERD, hyperlipidemia, hypertension, osteopenia, overactive bladder who presented to the emergency department with a fall with benign workup and was awaiting placement for SNF but subsequently became hypoxic mildly dyspneic.  Imaging revealed left lower lobe infiltrate. She denied fever, chills, rhinorrhea, sore throat, wheezing or hemoptysis.  No chest pain, palpitations, diaphoresis, PND, orthopnea or pitting edema of the lower extremities.  No abdominal pain, nausea, emesis, diarrhea, constipation, melena or hematochezia.  No flank pain, dysuria, frequency or hematuria.  No polyuria, polydipsia, polyphagia or blurred vision.    Lab work: CBC showed white count 5.8, hemoglobin 10.0 g/dL with an MCV of 897.9 and platelets 143.  BMP showed a chloride 96 mmol/L and  calcium  8.8 mg/dL.  BNP was 96.8 pg/mL.   Imaging: Portable 1 view chest radiograph showed interval development of left lower lobe airspace opacity suspicious for left lower lobe pneumonia or aspiration pneumonitis.  2 view chest x-ray recommended in 3 to 4 weeks to ensure resolution after antibiotic therapy.  Mild enlargement of the cardiopericardial silhouette.  Low lung volumes.   ED course: Initial vital signs were temperature 98.2 F, pulse 64, respiration 14, BP 107/65 mmHg O2 sat 100% on nasal cannula oxygen at 4 LPM.  Patient received ceftriaxone  2 g IVPB and 1000 mL normal saline bolus.   Hospital Course:  #1 left lower lobe pneumonia -Patient noted to have developed pneumonia while in the ED awaiting placement to SNF with resulting hypoxemia. - Blood cultures negative x 5 days. - MRSA PCR negative. - SARS coronavirus 2 by PCR negative, influenza A and B by PCR negative, RSV by PCR negative. - Patient noted on 7/12 to remain lethargic with generalized tremors remained lethargic through 7/13. - Patient initially was on IV Rocephin  and azithromycin  and antibiotic coverage broadened to IV cefepime  for HCAP. - Repeat chest x-ray 7/13 with pneumonia. - Patient improved clinically, was more alert and received a 5-day course of IV cefepime  during the hospitalization.   - Patient was discharged on 2 more days of Augmentin  to complete a 7-day course of antibiotic treatment.   - Outpatient follow-up.    2.  Probable UTI -Urinalysis done consistent with UTI in the setting of lethargy. - Urine cultures with multiple species noted. - Patient received 5-day course of IV cefepime  during the hospitalization.    3.  Chronic heart failure with preserved ejection fraction -Lasix  on hold  due to limited oral intake and concerns for dehydration. - Patient maintained on home regimen metoprolol  25 mg twice daily.  -Lasix  will be resumed on discharge.   4.  Hypertension - Patient maintained on home  regimen diltiazem  180 mg daily, metoprolol  25 mg twice daily, amlodipine  2.5 mg daily.   5.  Paroxysmal A-fib - Patient maintained on home regimen diltiazem  180 mg daily, metoprolol  25 mg twice daily, amiodarone  200 mg daily for rate control. - Apixaban  for anticoagulation.   6.  GERD - Patient maintained on Pepcid .   7.  Hyperlipidemia - Patient remains on home regimen statin.   8.  Prediabetes - Remained euglycemic during the hospitalization.   9.  Class III obesity -BMI of 44.63 kg/m. - Lifestyle modification. - Outpatient follow-up with PCP.   10.  Toxic metabolic encephalopathy secondary to pneumonia, polypharmacy and dehydration -Patient noted to have been very conversant prior to admission, noted to have presented with lethargy, difficulty to stay awake. - CT head done negative for any acute abnormalities. - Ammonia within normal limits. - Mentation noted to improved after holding Lyrica  and OxyContin . - Lyrica  resumed at a lower dose. -Patient more alert and likely close to baseline by day of discharge.   11.  Dehydration -IV fluids.   12.  Fall/physical deconditioning -Patient seen by PT/ OT who recommended SNF.   - Patient will be discharged to SNF.    Procedures: CT head CT C-spine 07/19/2023 CT head 07/23/2023  Consultations: None  Discharge Exam: Vitals:   07/27/23 0423 07/27/23 1149  BP: 123/78 124/80  Pulse: 68 71  Resp:  18  Temp: 98.7 F (37.1 C) 98.9 F (37.2 C)  SpO2: 97% 94%    General: NAD Cardiovascular: Regular rate rhythm no murmurs rubs or gallops.  No JVD.  No pitting lower extremity edema. Respiratory: CTAB anterior lung fields  Discharge Instructions   Discharge Instructions     Diet - low sodium heart healthy   Complete by: As directed    Discharge wound care:   Complete by: As directed    Wound care  Daily      Comments: Cleanse buttocks  wound with NS, dry and apply Xeroform gauze Donna Sexton) to wound bed  daily and  cover with silicone foam.   Increase activity slowly   Complete by: As directed       Allergies as of 07/27/2023       Reactions   Morphine Nausea And Vomiting        Medication List     STOP taking these medications    amLODipine  2.5 MG tablet Commonly known as: NORVASC    torsemide  100 MG tablet Commonly known as: DEMADEX        TAKE these medications    acetaminophen  500 MG tablet Commonly known as: TYLENOL  Take 1 tablet (500 mg total) by mouth every 6 (six) hours as needed for mild pain (pain score 1-3) (or Fever >/= 101). What changed:  when to take this reasons to take this   amiodarone  200 MG tablet Commonly known as: PACERONE  Take 1 tablet (200 mg total) by mouth daily.   amoxicillin -clavulanate 875-125 MG tablet Commonly known as: AUGMENTIN  Take 1 tablet by mouth 2 (two) times daily for 2 days.   apixaban  5 MG Tabs tablet Commonly known as: ELIQUIS  Take 1 tablet (5 mg total) by mouth 2 (two) times daily.   atorvastatin  40 MG tablet Commonly known as: LIPITOR Take 1 tablet (40 mg total)  by mouth daily.   Blood Glucose Test Strips 333 Strp Test strips to be used with glucometer.  Patient will need to verify what brand she currently has.  Okay to fill any current brand to be testing 1 times per day PRN. Dx IFG   diltiazem  180 MG 24 hr capsule Commonly known as: CARDIZEM  CD Take 1 capsule (180 mg total) by mouth daily.   divalproex  500 MG 24 hr tablet Commonly known as: DEPAKOTE  ER Take 3 tablets (1,500 mg total) by mouth at bedtime.   famotidine  20 MG tablet Commonly known as: PEPCID  Take 20 mg by mouth daily.   feeding supplement Liqd Take 237 mLs by mouth 2 (two) times daily between meals.   furosemide  40 MG tablet Commonly known as: Lasix  Take 1 tablet (40 mg total) by mouth daily.   Gerhardt's butt cream Crea Apply 1 Application topically 2 (two) times daily. Cleanse buttocks with soap and water, dry and apply Gerhardt's Butt Cream to  intact skin 2 times a day and prn soiling   levalbuterol  0.63 MG/3ML nebulizer solution Commonly known as: XOPENEX  Take 3 mLs (0.63 mg total) by nebulization every 6 (six) hours as needed for wheezing or shortness of breath.   melatonin 3 MG Tabs tablet Take 1 tablet (3 mg total) by mouth at bedtime as needed. What changed: reasons to take this   metoprolol  tartrate 25 MG tablet Commonly known as: LOPRESSOR  Take 1 tablet (25 mg total) by mouth 2 (two) times daily.   mometasone -formoterol  100-5 MCG/ACT Aero Commonly known as: DULERA  Inhale 2 puffs into the lungs 2 (two) times daily.   ondansetron  4 MG tablet Commonly known as: ZOFRAN  Take 1 tablet (4 mg total) by mouth every 6 (six) hours as needed for nausea.   oxybutynin  5 MG tablet Commonly known as: DITROPAN  Take 1 tablet (5 mg total) by mouth 2 (two) times daily.   polyethylene glycol 17 g packet Commonly known as: MIRALAX  / GLYCOLAX  Take 17 g by mouth daily as needed for moderate constipation.   potassium chloride  SA 20 MEQ tablet Commonly known as: KLOR-CON  M Take 1 tablet (20 mEq total) by mouth daily.   pregabalin  50 MG capsule Commonly known as: LYRICA  Take 1 capsule (50 mg total) by mouth 2 (two) times daily. What changed: when to take this   promethazine  25 MG tablet Commonly known as: PHENERGAN  TAKE ONE TABLET BY MOUTH EVERY 8 HOURS AS NEEDED FOR NAUSEA AND VOMITING What changed: See the new instructions.   senna-docusate 8.6-50 MG tablet Commonly known as: Senokot-S Take 2 tablets by mouth at bedtime.   traMADol  50 MG tablet Commonly known as: ULTRAM  Take 1 tablet (50 mg total) by mouth every 6 (six) hours as needed for moderate pain (pain score 4-6).               Discharge Care Instructions  (From admission, onward)           Start     Ordered   07/27/23 0000  Discharge wound care:       Comments: Wound care  Daily      Comments: Cleanse buttocks  wound with NS, dry and apply  Xeroform gauze Donna [id number]) to wound bed  daily and cover with silicone foam.   07/27/23 1153           Allergies  Allergen Reactions   Morphine Nausea And Vomiting    Contact information for follow-up providers     MD  AT SNF Follow up.               Contact information for after-discharge care     Destination     [facility], [place] .   Service: Skilled Nursing Contact information: [address] [place]  [phone]                      The results of significant diagnostics from this hospitalization (including imaging, microbiology, ancillary and laboratory) are listed below for reference.    Significant Diagnostic Studies: CT HEAD WO CONTRAST ( ) Result Date: 07/23/2023 EXAM: CT HEAD WITHOUT 07/23/2023 02:45:49 PM TECHNIQUE: CT of the head was performed without the administration of intravenous contrast. Automated exposure control, iterative reconstruction, and/or weight based adjustment of the mA/kV was utilized to reduce the radiation dose to as low as reasonably achievable. COMPARISON: 07/19/2023 CLINICAL HISTORY: Mental status change, unknown cause. Mental status change today per RN. FINDINGS: BRAIN AND VENTRICLES: No acute intracranial hemorrhage. No mass effect or midline shift. No extra-axial fluid collection. Gray-white differentiation is maintained. No hydrocephalus. Stable background of mild-to-moderate chronic small vessel disease. ORBITS: No acute abnormality. SINUSES AND MASTOIDS: Prior bilateral maxillary ORIF. Partial opacification of the ethmoid air cells. SOFT TISSUES AND SKULL: No acute skull fracture. No acute soft tissue abnormality. IMPRESSION: 1. No acute intracranial abnormality. 2. Stable background of mild-to-moderate chronic small vessel disease. Electronically signed by: Ryan Chess MD 07/23/2023 03:14 PM EDT RP Workstation: [id number]   DG CHEST PORT 1 VIEW Result Date:  07/23/2023 CLINICAL DATA:  [id number] Pneumonia [id number] EXAM: PORTABLE CHEST 1 VIEW COMPARISON:  July 21, 2023 FINDINGS: Low lung volume radiograph. The cardiomediastinal silhouette is unchanged in contour. No pleural effusion. No pneumothorax. Persistent LEFT basilar airspace opacity. IMPRESSION: Persistent LEFT basilar airspace opacity which with differential considerations including pneumonia or atelectasis. Recommend follow-up PA and lateral chest radiograph in 4-6 weeks to assess for resolution. Electronically Signed   By: Corean Salter M.D.   On: 07/23/2023 13:11   DG Chest Port 1 View Result Date: 07/21/2023 CLINICAL DATA:  Shortness of breath EXAM: PORTABLE CHEST 1 VIEW COMPARISON:  07/19/2023 FINDINGS: Interval development of left lower lobe airspace opacity with early obscuration of left hemidiaphragm suspicious for left lower lobe pneumonia or aspiration pneumonitis. Low lung volumes are present, causing crowding of the pulmonary vasculature. Mild enlargement of the cardiopericardial silhouette. Degenerative left glenohumeral arthropathy. IMPRESSION: 1. Interval development of left lower lobe airspace opacity suspicious for left lower lobe pneumonia or aspiration pneumonitis. Followup PA and lateral chest X-ray is recommended in 3-4 weeks following trial of antibiotic therapy to ensure resolution and exclude underlying malignancy. 2. Mild enlargement of the cardiopericardial silhouette. 3. Low lung volumes. Electronically Signed   By: Ryan Salvage M.D.   On: 07/21/2023 13:32   CT CERVICAL SPINE WO CONTRAST Result Date: 07/19/2023 CLINICAL DATA:  Fall EXAM: CT CERVICAL SPINE WITHOUT CONTRAST TECHNIQUE: Multidetector CT imaging of the cervical spine was performed without intravenous contrast. Multiplanar CT image reconstructions were also generated. RADIATION DOSE REDUCTION: This exam was performed according to the departmental dose-optimization program which includes automated exposure control,  adjustment of the mA and/or kV according to patient size and/or use of iterative reconstruction technique. COMPARISON:  03/01/2023 FINDINGS: Alignment: Slight degenerative anterolisthesis at C2-3, C3-4 and C4-5, stable since prior study. Skull base and vertebrae: No acute fracture. No primary bone lesion or focal pathologic process. Soft tissues and spinal canal: No prevertebral fluid  or swelling. No visible canal hematoma. Disc levels: Diffuse degenerative disc disease with disc space narrowing and spurring most pronounced in the lower cervical spine. Bilateral degenerative facet disease. No focal disc herniation. Upper chest: No acute findings Other: None IMPRESSION: Cervical spondylosis.  No acute bony abnormality. Electronically Signed   By: Franky Crease M.D.   On: 07/19/2023 20:26   CT HEAD WO CONTRAST Result Date: 07/19/2023 CLINICAL DATA:  Fall EXAM: CT HEAD WITHOUT CONTRAST TECHNIQUE: Contiguous axial images were obtained from the base of the skull through the vertex without intravenous contrast. RADIATION DOSE REDUCTION: This exam was performed according to the departmental dose-optimization program which includes automated exposure control, adjustment of the mA and/or kV according to patient size and/or use of iterative reconstruction technique. COMPARISON:  04/13/2023 FINDINGS: Brain: There is atrophy and chronic small vessel disease changes. No acute intracranial abnormality. Specifically, no hemorrhage, hydrocephalus, mass lesion, acute infarction, or significant intracranial injury. Vascular: No hyperdense vessel or unexpected calcification. Skull: No acute calvarial abnormality. Sinuses/Orbits: No acute findings Other: None IMPRESSION: No acute intracranial abnormality. Electronically Signed   By: Franky Crease M.D.   On: 07/19/2023 20:24   DG Pelvis 1-2 Views Result Date: 07/19/2023 CLINICAL DATA:  Pain after a fall. EXAM: PELVIS - 1-2 VIEW COMPARISON:  Sacrum 08/26/2020 FINDINGS: Degenerative  changes in the lower lumbar spine and in both hips. No evidence of acute fracture or dislocation. No focal bone lesion or bone destruction. SI joints and symphysis pubis are not displaced. Visualized sacrum appears intact. Pelvic calcifications consistent with phleboliths. IMPRESSION: Degenerative changes in the lower lumbar spine and hips. No acute displaced fractures identified. Electronically Signed   By: Elsie Gravely M.D.   On: 07/19/2023 20:09   DG Elbow Complete Right Result Date: 07/19/2023 CLINICAL DATA:  Right elbow pain after a fall. EXAM: RIGHT ELBOW - COMPLETE 3+ VIEW COMPARISON:  None Available. FINDINGS: Degenerative changes in the elbow joint. Moderate spur formation on the coronary process. No evidence of acute fracture or dislocation. No focal bone lesion or bone destruction. Bone cortex appears intact. No significant effusions. Soft tissues are unremarkable. IMPRESSION: Degenerative changes in the right elbow. No acute fracture or dislocation. Electronically Signed   By: Elsie Gravely M.D.   On: 07/19/2023 20:08   DG Sacrum/Coccyx Result Date: 07/19/2023 CLINICAL DATA:  fall tailbone pain EXAM: SACRUM AND COCCYX - 2+ VIEW COMPARISON:  None Available. FINDINGS: There is no evidence of fracture or other focal bone lesions. IMPRESSION: Negative. Electronically Signed   By: Evalene Coho M.D.   On: 07/19/2023 20:07   DG Chest 2 View Result Date: 07/19/2023 CLINICAL DATA:  fall, weakness EXAM: CHEST - 2 VIEW COMPARISON:  AP radiograph chest dated April 13, 2023. FINDINGS: The heart size and mediastinal contours are within normal limits. Both lungs are clear. The visualized skeletal structures are unremarkable. IMPRESSION: No active cardiopulmonary disease. Electronically Signed   By: Evalene Coho M.D.   On: 07/19/2023 20:06    Microbiology: Recent Results (from the past 240 hours)  Resp panel by RT-PCR (RSV, Flu A&B, Covid) Anterior Nasal Swab     Status: None   Collection  Time: 07/19/23  6:10 PM   Specimen: Anterior Nasal Swab  Result Value Ref Range Status   SARS Coronavirus 2 by RT PCR NEGATIVE NEGATIVE Final    Comment: (NOTE) SARS-CoV-2 target nucleic acids are NOT DETECTED.  The SARS-CoV-2 RNA is generally detectable in upper respiratory specimens during the acute phase of  infection. The lowest concentration of SARS-CoV-2 viral copies this assay can detect is 138 copies/mL. A negative result does not preclude SARS-Cov-2 infection and should not be used as the sole basis for treatment or other patient management decisions. A negative result may occur with  improper specimen collection/handling, submission of specimen other than nasopharyngeal swab, presence of viral mutation(s) within the areas targeted by this assay, and inadequate number of viral copies(<138 copies/mL). A negative result must be combined with clinical observations, patient history, and epidemiological information. The expected result is Negative.  Fact Sheet for Patients:  BloggerCourse.com  Fact Sheet for Healthcare Providers:  SeriousBroker.it  This test is no t yet approved or cleared by the [place]  FDA and  has been authorized for detection and/or diagnosis of SARS-CoV-2 by FDA under an Emergency Use Authorization (EUA). This EUA will remain  in effect (meaning this test can be used) for the duration of the COVID-19 declaration under Section 564(b)(1) of the Act, 21 U.S.C.section 360bbb-3(b)(1), unless the authorization is terminated  or revoked sooner.       Influenza A by PCR NEGATIVE NEGATIVE Final   Influenza B by PCR NEGATIVE NEGATIVE Final    Comment: (NOTE) The Xpert Xpress SARS-CoV-2/FLU/RSV plus assay is intended as an aid in the diagnosis of influenza from Nasopharyngeal swab specimens and should not be used as a sole basis for treatment. Nasal washings and aspirates are unacceptable for Xpert Xpress  SARS-CoV-2/FLU/RSV testing.  Fact Sheet for Patients: BloggerCourse.com  Fact Sheet for Healthcare Providers: SeriousBroker.it  This test is not yet approved or cleared by the [place]  FDA and has been authorized for detection and/or diagnosis of SARS-CoV-2 by FDA under an Emergency Use Authorization (EUA). This EUA will remain in effect (meaning this test can be used) for the duration of the COVID-19 declaration under Section 564(b)(1) of the Act, 21 U.S.C. section 360bbb-3(b)(1), unless the authorization is terminated or revoked.     Resp Syncytial Virus by PCR NEGATIVE NEGATIVE Final    Comment: (NOTE) Fact Sheet for Patients: BloggerCourse.com  Fact Sheet for Healthcare Providers: SeriousBroker.it  This test is not yet approved or cleared by the [place]  FDA and has been authorized for detection and/or diagnosis of SARS-CoV-2 by FDA under an Emergency Use Authorization (EUA). This EUA will remain in effect (meaning this test can be used) for the duration of the COVID-19 declaration under Section 564(b)(1) of the Act, 21 U.S.C. section 360bbb-3(b)(1), unless the authorization is terminated or revoked.  Performed at [facility], [address], [place], [place] [address]   Blood culture (routine x 2)     Status: None   Collection Time: 07/19/23  8:33 PM   Specimen: BLOOD  Result Value Ref Range Status   Specimen Description   Final    BLOOD LEFT ANTECUBITAL Performed at [facility], [address] [place], [place] [address]    Special Requests   Final    BOTTLES DRAWN AEROBIC AND ANAEROBIC Blood Culture results may not be optimal due to an inadequate volume of blood received in culture bottles Performed at [facility], [address] [place], [place] [address]    Culture   Final    NO GROWTH 5  DAYS Performed at [facility], [address] [place], [place] [address]    Report Status 07/24/2023 FINAL  Final  Urine Culture     Status: Abnormal   Collection Time: 07/19/23  8:55 PM  Specimen: Urine, Catheterized  Result Value Ref Range Status   Specimen Description   Final    URINE, CATHETERIZED Performed at [facility], [address], [place], [place] [address]    Special Requests   Final    NONE Performed at [facility], [address] [place], [place] [address]    Culture MULTIPLE SPECIES PRESENT, SUGGEST RECOLLECTION (A)  Final   Report Status 07/20/2023 FINAL  Final  Blood culture (routine x 2)     Status: None   Collection Time: 07/19/23 10:33 PM   Specimen: BLOOD  Result Value Ref Range Status   Specimen Description   Final    BLOOD RIGHT ANTECUBITAL Performed at [facility], [address], [place], [place] [address]    Special Requests   Final    BOTTLES DRAWN AEROBIC ONLY Blood Culture results may not be optimal due to an inadequate volume of blood received in culture bottles Performed at [facility], [address] [place], [place] [address]    Culture   Final    NO GROWTH 5 DAYS Performed at [facility], [address], [place], [place] [address]    Report Status 07/25/2023 FINAL  Final  MRSA Next Gen by PCR, Nasal     Status: None   Collection Time: 07/23/23 10:03 PM   Specimen: Nasal Swab  Result Value Ref Range Status   MRSA by PCR Next Gen NOT DETECTED NOT DETECTED Final    Comment: (NOTE) The GeneXpert MRSA Assay (FDA approved for NASAL specimens only), is one component of a comprehensive MRSA colonization surveillance program. It is not intended to diagnose MRSA infection nor to guide or monitor treatment for MRSA infections. Test performance is not FDA approved in patients less than 31 years old. Performed at [facility], [address] [place], [place] [address]      Labs: Basic Metabolic Panel: Recent Labs  Lab 07/22/23 0540 07/23/23 0715 07/24/23 0506 07/26/23 0522 07/27/23 0518  NA 136 138 138 134* 136  K 3.8 3.9 4.1 4.5 4.3  CL 98 102 102 101 102  CO2 28 27 26 25 28   GLUCOSE 109* 132* 117* 94 96  BUN 19 21 16 17 17   CREATININE 0.90 0.80 0.61 0.62 0.56  CALCIUM  9.0 8.9 9.1 9.5 9.2  MG 1.9  --   --   --   --   PHOS 4.6  --   --   --   --    Liver Function Tests: Recent Labs  Lab 07/22/23 0540 07/23/23 0715 07/24/23 0506 07/26/23 0522  AST 24 24 19 20   ALT 16 17 15 15   ALKPHOS 47 44 41 46  BILITOT 0.7 0.7 0.8 0.6  PROT 4.7* 4.9* 4.5* 4.7*  ALBUMIN 2.5* 2.5* 2.4* 2.4*   No results for input(s): LIPASE, AMYLASE in the last 168 hours. Recent Labs  Lab 07/23/23 1605  AMMONIA 18   CBC: Recent Labs  Lab 07/21/23 1237 07/22/23 0540 07/23/23 0715 07/24/23 0506 07/25/23 0454 07/26/23 0522 07/27/23 0518  WBC 5.8   < > 5.1 4.6 5.0 5.5 5.3  NEUTROABS 3.3  --   --   --   --   --   --   HGB 10.0*   < > 9.3* 8.9* 9.1* 8.9* 8.3*  HCT 30.3*   < > 28.5* 26.4* 26.8* 27.1* 24.8*  MCV 102.0*   < > 103.3* 101.5* 99.3  103.0* 100.0  PLT 143*   < > 129* 121* 113* 119* 127*   < > = values in this interval not displayed.   Cardiac Enzymes: No results for input(s): CKTOTAL, CKMB, CKMBINDEX, TROPONINI in the last 168 hours. BNP: BNP (last 3 results) Recent Labs    05/25/23 1445 07/19/23 1737 07/21/23 1237  BNP 84.9 51.2 96.8    ProBNP (last 3 results) No results for input(s): PROBNP in the last 8760 hours.  CBG: No results for input(s): GLUCAP in the last 168 hours.     Signed:  Toribio Hummer MD.  [facility] 07/27/2023, 12:11 PM

## 2023-07-27 NOTE — Care Management Important Message (Signed)
 Important Message  Patient Details IM Letter given. Name: Donna Sexton MRN: [id number] Date of Birth: Jul 21, 1950   Important Message Given:  Yes - Medicare IM     Melba Ates 07/27/2023, 12:34 PM

## 2023-07-27 NOTE — Plan of Care (Signed)
   Problem: Education: Goal: Knowledge of General Education information will improve Description: Including pain rating scale, medication(s)/side effects and non-pharmacologic comfort measures Outcome: Completed/Met   Problem: Health Behavior/Discharge Planning: Goal: Ability to manage health-related needs will improve Outcome: Completed/Met   Problem: Clinical Measurements: Goal: Ability to maintain clinical measurements within normal limits will improve Outcome: Completed/Met Goal: Will remain free from infection Outcome: Completed/Met Goal: Diagnostic test results will improve Outcome: Completed/Met Goal: Respiratory complications will improve Outcome: Completed/Met Goal: Cardiovascular complication will be avoided Outcome: Completed/Met   Problem: Activity: Goal: Risk for activity intolerance will decrease Outcome: Completed/Met   Problem: Nutrition: Goal: Adequate nutrition will be maintained Outcome: Completed/Met   Problem: Coping: Goal: Level of anxiety will decrease Outcome: Completed/Met   Problem: Elimination: Goal: Will not experience complications related to bowel motility Outcome: Completed/Met Goal: Will not experience complications related to urinary retention Outcome: Completed/Met   Problem: Pain Managment: Goal: General experience of comfort will improve and/or be controlled Outcome: Completed/Met   Problem: Safety: Goal: Ability to remain free from injury will improve Outcome: Completed/Met   Problem: Skin Integrity: Goal: Risk for impaired skin integrity will decrease Outcome: Completed/Met   Problem: Activity: Goal: Ability to tolerate increased activity will improve Outcome: Completed/Met   Problem: Clinical Measurements: Goal: Ability to maintain a body temperature in the normal range will improve Outcome: Completed/Met   Problem: Respiratory: Goal: Ability to maintain adequate ventilation will improve Outcome: Completed/Met Goal:  Ability to maintain a clear airway will improve Outcome: Completed/Met

## 2023-07-27 NOTE — TOC Transition Note (Signed)
 Transition of Care [facility]) - Discharge Note  Patient Details  Name: Donna Sexton MRN: [id number] Date of Birth: 1950-07-08  Transition of Care [facility]) CM/SW Contact:  Duwaine GORMAN Aran, LCSW Phone Number: 07/27/2023, 10:54 AM  Clinical Narrative: CSW notified by hospitalist that patient is expected to be medically ready today. CSW confirmed bed is available today with [place] in admission. CSW completed insurance authorization on [employer] portal. Reference ID # is: [id number]. Patient is approved for 07/27/2023-07/31/2023.  Patient will go to room 508P and the number for report is [phone]. Discharge summary, discharge orders, and SNF transfer report faxed to facility in hub. Medical necessity form done; PTAR scheduled. Discharge packet completed. CSW updated patient, ex-husband, and son regarding transportation being set up. RN updated. TOC signing off.  Final next level of care: Skilled Nursing Facility Barriers to Discharge: Barriers Resolved  Patient Goals and CMS Choice Patient states their goals for this hospitalization and ongoing recovery are:: Rehab CMS Medicare.gov Compare Post Acute Care list provided to:: Patient Represenative (must comment) Choice offered to / list presented to : Spouse, Adult Children  Discharge Placement PASRR number recieved: 07/21/23       Patient [facility] bed at: [facility] Patient to be transferred to facility by: PTAR Name of family member notified: Fonda Sluder (son) Patient and family notified of of transfer: 07/27/23  Discharge Plan and Services Additional resources added to the After Visit Summary for   In-house Referral: Clinical Social Work Post Acute Care Choice: Skilled Nursing Facility          DME Arranged: N/A DME Agency: NA  Social Drivers of Health (SDOH) Interventions SDOH Screenings   Food Insecurity: No Food Insecurity (07/21/2023)  Recent Concern: Food Insecurity - Food Insecurity Present (06/26/2023)  Housing: Low Risk   (07/21/2023)  Transportation Needs: No Transportation Needs (07/21/2023)  Utilities: Not At Risk (07/21/2023)  Alcohol Screen: Low Risk  (03/28/2022)  Depression (PHQ2-9): Medium Risk (06/26/2023)  Financial Resource Strain: Low Risk  (06/26/2023)  Physical Activity: Inactive (06/26/2023)  Social Connections: Socially Isolated (07/21/2023)  Stress: Stress Concern Present (06/26/2023)  Tobacco Use: Low Risk  (07/19/2023)   Readmission Risk Interventions     No data to display

## 2023-07-28 LAB — BLOOD GAS, ARTERIAL
Acid-Base Excess: 5.5 mmol/L — ABNORMAL HIGH (ref 0.0–2.0)
Bicarbonate: 31.8 mmol/L — ABNORMAL HIGH (ref 20.0–28.0)
Drawn by: 20012
FIO2: 36 %
O2 Content: 4 L/min
O2 Saturation: 99.5 %
Patient temperature: 37.3
pCO2 arterial: 56 mmHg — ABNORMAL HIGH (ref 32–48)
pH, Arterial: 7.37 (ref 7.35–7.45)
pO2, Arterial: 107 mmHg (ref 83–108)

## 2023-07-31 DIAGNOSIS — I480 Paroxysmal atrial fibrillation: Secondary | ICD-10-CM | POA: Diagnosis not present

## 2023-07-31 DIAGNOSIS — J189 Pneumonia, unspecified organism: Secondary | ICD-10-CM | POA: Diagnosis not present

## 2023-07-31 DIAGNOSIS — G9341 Metabolic encephalopathy: Secondary | ICD-10-CM | POA: Diagnosis not present

## 2023-07-31 DIAGNOSIS — R531 Weakness: Secondary | ICD-10-CM | POA: Diagnosis not present

## 2023-08-02 DIAGNOSIS — M5030 Other cervical disc degeneration, unspecified cervical region: Secondary | ICD-10-CM | POA: Diagnosis not present

## 2023-08-02 DIAGNOSIS — I5032 Chronic diastolic (congestive) heart failure: Secondary | ICD-10-CM | POA: Diagnosis not present

## 2023-08-02 DIAGNOSIS — G4733 Obstructive sleep apnea (adult) (pediatric): Secondary | ICD-10-CM | POA: Diagnosis not present

## 2023-08-02 DIAGNOSIS — I110 Hypertensive heart disease with heart failure: Secondary | ICD-10-CM | POA: Diagnosis not present

## 2023-08-03 DIAGNOSIS — M170 Bilateral primary osteoarthritis of knee: Secondary | ICD-10-CM | POA: Diagnosis not present

## 2023-08-07 DIAGNOSIS — F5105 Insomnia due to other mental disorder: Secondary | ICD-10-CM | POA: Diagnosis not present

## 2023-08-07 DIAGNOSIS — F3130 Bipolar disorder, current episode depressed, mild or moderate severity, unspecified: Secondary | ICD-10-CM | POA: Diagnosis not present

## 2023-08-08 ENCOUNTER — Telehealth: Admitting: Psychiatry

## 2023-08-08 ENCOUNTER — Encounter: Payer: Self-pay | Admitting: Psychiatry

## 2023-08-08 DIAGNOSIS — F411 Generalized anxiety disorder: Secondary | ICD-10-CM | POA: Diagnosis not present

## 2023-08-08 DIAGNOSIS — F319 Bipolar disorder, unspecified: Secondary | ICD-10-CM | POA: Diagnosis not present

## 2023-08-08 DIAGNOSIS — F5105 Insomnia due to other mental disorder: Secondary | ICD-10-CM | POA: Diagnosis not present

## 2023-08-08 DIAGNOSIS — F1311 Sedative, hypnotic or anxiolytic abuse, in remission: Secondary | ICD-10-CM | POA: Diagnosis not present

## 2023-08-08 DIAGNOSIS — Z87898 Personal history of other specified conditions: Secondary | ICD-10-CM
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
## 2023-08-09 DIAGNOSIS — G4700 Insomnia, unspecified: Secondary | ICD-10-CM | POA: Diagnosis not present

## 2023-08-09 DIAGNOSIS — J189 Pneumonia, unspecified organism: Secondary | ICD-10-CM | POA: Diagnosis not present

## 2023-08-09 DIAGNOSIS — M5030 Other cervical disc degeneration, unspecified cervical region: Secondary | ICD-10-CM | POA: Diagnosis not present

## 2023-08-09 DIAGNOSIS — D649 Anemia, unspecified: Secondary | ICD-10-CM | POA: Diagnosis not present

## 2023-08-17 DIAGNOSIS — R300 Dysuria: Secondary | ICD-10-CM | POA: Diagnosis not present

## 2023-08-17 DIAGNOSIS — B3731 Acute candidiasis of vulva and vagina: Secondary | ICD-10-CM | POA: Diagnosis not present

## 2023-08-17 DIAGNOSIS — K5903 Drug induced constipation: Secondary | ICD-10-CM | POA: Diagnosis not present

## 2023-08-17 DIAGNOSIS — N3281 Overactive bladder: Secondary | ICD-10-CM | POA: Diagnosis not present

## 2023-08-18 NOTE — Progress Notes (Signed)
 Cardiology Office Note:    Date:  08/22/2023   ID:  Donna Sexton, DOB 04/15/1950, MRN [id number]  PCP:  Alvan Dorothyann BIRCH, MD    [facility] Providers Cardiologist:  Hady Niemczyk [place], MD     Referring MD: Alvan Dorothyann BIRCH, *   No chief complaint on file.   History of Present Illness:    Donna Sexton is a 73 y.o. female seen for follow up Afib. 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 Afib has been managed with amiodarone .   She was admitted in Feb with sepsis and Covid. Was in AFib with RVR. Loaded on amiodarone . Rate controlled with diltiazem  and metoprolol   Readmitted in April with mental status changes and UTI.   She was admitted recently 7/9-7/17/25 with LLL PNA with hypoxemia. Treated with antibiotics. Remained in NSR.   On follow up she is still on oxygen but sats have been OK. Denies cough or fever. No chest pain. Sedentary. Has chronic LE edema which she reports has been present for years.   Past Medical History:  Diagnosis Date   Acute hypoxemic respiratory failure due to COVID-19 [facility]) 03/01/2023   Allergy Morphine   ANEMIA 11/12/2007   Qualifier: Diagnosis of  By: Sherron CMA, Steen     Anxiety    Arthritis    ASTHMA NOS W/ACUTE EXACERBATION 01/13/2010   Qualifier: Diagnosis of  By: Joshua MD, Debby LITTIE.    BIPOLAR DISORDER UNSPECIFIED 10/29/2007   Qualifier: Diagnosis of  By: Joshua MD, Debby LITTIE.    CHF (congestive heart failure) (HCC)    COMMON MIGRAINE 12/29/2008   Qualifier: Diagnosis of  By: Joshua MD, Debby LITTIE.    Encephalopathy due to infection 04/13/2023   GERD 07/28/2009   Qualifier: Diagnosis of  By: Inocencio MD, [place] LABOR    HYPERLIPIDEMIA 10/29/2007   Qualifier: Diagnosis of  By: Joshua MD, Debby LITTIE.    HYPERTENSION, BENIGN ESSENTIAL 10/29/2007   Qualifier: Diagnosis of  By: Joshua MD, Debby LITTIE.    Orbital mass, right 03/13/2023   OSTEOPENIA  10/30/2007   Qualifier: Diagnosis of  By: Joshua MD, Debby LITTIE.    OVERACTIVE BLADDER 02/21/2008   Qualifier: Diagnosis of  By: Norleen MD, [place] ORN    SLEEP APNEA 12/31/2009   Qualifier: Diagnosis of  By: Joshua MD, Debby LITTIE.    Trigger finger, left little finger 03/15/2017    Past Surgical History:  Procedure Laterality Date   ABDOMINAL HYSTERECTOMY     BREAST IMPLANT REMOVAL Bilateral 2022   BREAST SURGERY  01/10/1978   implants bilateral   CARPAL TUNNEL RELEASE     CHOLECYSTECTOMY     COSMETIC SURGERY     EYE SURGERY  cataract both eyes   HERNIA REPAIR     JOINT REPLACEMENT     PAROTID ENDOSCOPY     SPINE SURGERY  lower back   TUBAL LIGATION      Current Medications: Current Meds  Medication Sig   acetaminophen  (TYLENOL ) 500 MG tablet Take 1 tablet (500 mg total) by mouth every 6 (six) hours as needed for mild pain (pain score 1-3) (or Fever >/= 101).   amiodarone  (PACERONE ) 200 MG tablet Take 1 tablet (200 mg total) by mouth daily.   apixaban  (ELIQUIS ) 5 MG TABS tablet Take 1 tablet (5 mg total) by mouth 2 (two) times daily.   atorvastatin  (LIPITOR) 40 MG tablet Take 1 tablet (40 mg  total) by mouth daily.   diltiazem  (CARDIZEM  CD) 180 MG 24 hr capsule Take 1 capsule (180 mg total) by mouth daily.   divalproex  (DEPAKOTE  ER) 500 MG 24 hr tablet Take 3 tablets (1,500 mg total) by mouth at bedtime.   famotidine  (PEPCID ) 20 MG tablet Take 20 mg by mouth daily.   feeding supplement (ENSURE PLUS HIGH PROTEIN) LIQD Take 237 mLs by mouth 2 (two) times daily between meals.   furosemide  (LASIX ) 40 MG tablet Take 1 tablet (40 mg total) by mouth daily.   Glucose Blood (BLOOD GLUCOSE TEST STRIPS 333) STRP Test strips to be used with glucometer.  Patient will need to verify what brand she currently has.  Okay to fill any current brand to be testing 1 times per day PRN. Dx IFG   HYDROcodone -acetaminophen  (NORCO/VICODIN) 5-325 MG tablet Take 1 tablet by mouth 2 (two) times daily as needed.    levalbuterol  (XOPENEX ) 0.63 MG/3ML nebulizer solution Take 3 mLs (0.63 mg total) by nebulization every 6 (six) hours as needed for wheezing or shortness of breath.   melatonin 3 MG TABS tablet Take 1 tablet (3 mg total) by mouth at bedtime as needed. (Patient taking differently: Take 3 mg by mouth at bedtime as needed (sleep).)   metoprolol  tartrate (LOPRESSOR ) 25 MG tablet Take 1 tablet (25 mg total) by mouth 2 (two) times daily.   mometasone -formoterol  (DULERA ) 100-5 MCG/ACT AERO Inhale 2 puffs into the lungs 2 (two) times daily.   Nystatin (GERHARDT'S BUTT CREAM) CREA Apply 1 Application topically 2 (two) times daily. Cleanse buttocks with soap and water, dry and apply Gerhardt's Butt Cream to intact skin 2 times a day and prn soiling   ondansetron  (ZOFRAN ) 4 MG tablet Take 1 tablet (4 mg total) by mouth every 6 (six) hours as needed for nausea.   oxybutynin  (DITROPAN ) 5 MG tablet Take 1 tablet (5 mg total) by mouth 2 (two) times daily.   polyethylene glycol (MIRALAX  / GLYCOLAX ) 17 g packet Take 17 g by mouth daily as needed for moderate constipation.   potassium chloride  SA (KLOR-CON  M) 20 MEQ tablet Take 1 tablet (20 mEq total) by mouth daily.   pregabalin  (LYRICA ) 50 MG capsule Take 1 capsule (50 mg total) by mouth 2 (two) times daily.   promethazine  (PHENERGAN ) 25 MG tablet TAKE ONE TABLET BY MOUTH EVERY 8 HOURS AS NEEDED FOR NAUSEA AND VOMITING   senna-docusate (SENOKOT-S) 8.6-50 MG tablet Take 2 tablets by mouth at bedtime.   traMADol  (ULTRAM ) 50 MG tablet Take 1 tablet (50 mg total) by mouth every 6 (six) hours as needed for moderate pain (pain score 4-6).     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 She enjoys making wreaths to hang on the doors   Social Drivers of  Health   Financial Resource Strain: Low Risk  (06/26/2023)   Overall Financial Resource Strain (CARDIA)    Difficulty of Paying Living Expenses: Not hard at all  Food Insecurity: No Food Insecurity (07/21/2023)   Hunger Vital Sign    Worried About Running Out of Food in the Last Year: Never true    Ran Out of Food in the Last Year: Never true  Recent Concern: Food Insecurity - Food Insecurity Present (06/26/2023)   Hunger Vital Sign    Worried About Running Out of Food in the Last Year: Often true    Ran Out of Food in the Last Year: Patient declined  Transportation Needs: No Transportation Needs (07/21/2023)   PRAPARE - Administrator, Civil Service (Medical): No    Lack of Transportation (Non-Medical): No  Physical Activity: Inactive (06/26/2023)   Exercise Vital Sign    Days of Exercise per Week: 0 days    Minutes of Exercise per Session: Not on file  Stress: Stress Concern Present (06/26/2023)   [employer] of Occupational Health - Occupational Stress Questionnaire    Feeling of Stress: Very much  Social Connections: Socially Isolated (07/21/2023)   Social Connection and Isolation Panel    Frequency of Communication with Friends and Family: More than three times a week    Frequency of Social Gatherings with Friends and Family: Three times a week    Attends Religious Services: Never    Active Member of Clubs or Organizations: No    Attends Banker Meetings: Never    Marital Status: Divorced     Family History: The patient's family history includes Alcohol abuse in her maternal uncle and maternal  uncle; Anxiety disorder in her mother; Arthritis in her father; Diabetes in her father; Heart attack in her father and mother; Heart failure in her brother and mother; Hypertension in her father and mother. There is no history of Breast cancer.  ROS:   Please see the history of present illness.     All other systems reviewed and are negative.  EKGs/Labs/Other Studies Reviewed:    The following studies were reviewed today:  Event monitor 03/04/22: Study Highlights Show Result Comparison    Predominant rhythm was normal sinus rhythm with an average heart rate of 79 bpm and ranged from 56 to 120 bpm   1 episode of nonsustained ventricular tachycardia lasting 4 beats with a maximal heart rate of 231 bpm   Multiple episodes of paroxysmal atrial tachycardia with longest run lasting 20 beats and fastest interval 190 bpm   Rare PACs, atrial couplets and triplets   Rare PVCs, ventricular couplets and triplets as well as ventricular bigeminy and trigeminy.  PVC load <1%     Patch Wear Time:  10 days and 22 hours (2024-02-01T15:28:03-498 to 2024-02-12T14:20:29-0500)   Patient had a min HR of 56 bpm, max HR of 231 bpm, and avg HR of 79 bpm. Predominant underlying rhythm was Sinus Rhythm. 1 run of Ventricular Tachycardia occurred lasting 4 beats with a max rate of 231 bpm (avg 207 bpm). 37 Supraventricular Tachycardia  runs occurred, the run with the fastest interval lasting 6 beats with a max rate of 190 bpm, the longest lasting 20 beats with an avg rate of 155 bpm. Some episodes of Supraventricular Tachycardia may be possible Atrial Tachycardia with variable block.  Isolated SVEs were rare (<1.0%), SVE Couplets were rare (<1.0%), and SVE Triplets were rare (<1.0%). Isolated VEs were rare (<1.0%, 3468), VE Couplets  were rare (<1.0%, 184), and VE Triplets were rare (<1.0%, 24). Ventricular Bigeminy and Trigeminy were  present.    Echo 03/02/23: IMPRESSIONS     1. Left ventricular ejection fraction,  by estimation, is 70 to 75%. The  left ventricle has hyperdynamic function. Left ventricular endocardial  border not optimally defined to evaluate regional wall motion. Left  ventricular diastolic parameters are  indeterminate.   2. Right ventricular systolic function is normal. The right ventricular  size is normal. Tricuspid regurgitation signal is inadequate for assessing  PA pressure.   3. The mitral valve was not well visualized. No evidence of mitral valve  regurgitation. No evidence of mitral stenosis.   4. The aortic valve was not well visualized. Aortic valve regurgitation  is not visualized. No aortic stenosis is present.   5. The inferior vena cava is normal in size with greater than 50%  respiratory variability, suggesting right atrial pressure of 3 mmHg.       Recent Labs: 05/25/2023: TSH 4.090 07/21/2023: B Natriuretic Peptide 96.8 07/22/2023: Magnesium  1.9 07/26/2023: ALT 15 07/27/2023: BUN 17; Creatinine, Ser 0.56; Hemoglobin 8.3; Platelets 127; Potassium 4.3; Sodium 136  Recent Lipid Panel    Component Value Date/Time   CHOL 154 02/04/2022 1410   TRIG 121 02/04/2022 1410   HDL 62 02/04/2022 1410   CHOLHDL 2.5 02/04/2022 1410   VLDL 28 02/03/2016 0819   LDLCALC 72 02/04/2022 1410     Risk Assessment/Calculations:    CHA2DS2-VASc Score = 4   This indicates a 4.8% annual risk of stroke. The patient's score is based upon: CHF History: 1 HTN History: 1 Diabetes History: 0 Stroke History: 0 Vascular Disease History: 0 Age Score: 1 Gender Score: 1       Physical Exam:    VS:  BP 105/70 (BP Location: Right Arm, Patient Position: Sitting, Cuff Size: Large)   Pulse (!) 56   Ht 5' 4 (1.626 m)   Wt 247 lb (112 kg)   SpO2 100%   BMI 42.40 kg/m     Wt Readings from Last 3 Encounters:  08/22/23 247 lb (112 kg)  07/21/23 260 lb (117.9 kg)  06/26/23 251 lb (113.9 kg)     GEN:  morbidly obese in no acute distress HEENT: Normal NECK: No JVD; No carotid  bruits LYMPHATICS: No lymphadenopathy CARDIAC: RRR, no murmurs, rubs, gallops RESPIRATORY:  Clear to auscultation without rales, wheezing or rhonchi  ABDOMEN: Soft, non-tender, non-distended MUSCULOSKELETAL:  1+ edema; No deformity  SKIN: Warm and dry NEUROLOGIC:  Alert and oriented x 3 PSYCHIATRIC:  Normal affect   ASSESSMENT:    1. Paroxysmal atrial fibrillation (HCC)   2. Chronic diastolic heart failure (HCC)   3. Essential hypertension    PLAN:    In order of problems listed above:  Atrial fibrillation paroxysmal in setting of sepsis and Covid in Feb. Has maintained NSR since then. Labs stable. Recommend reducing amiodarone  to 100 mg daily and diltiazem  to 120 mg daily. If remains in NSR on follow up may be able to taper more. Continue metoprolol  and Eliquis  Chronic diastolic CHF. Last echo looked good. Unclear how much her LE swelling is related to this vs venous insufficiency. Unfortunately legs are dependent. Would continue current lasix  dose.      Will follow up in 4 months with APP      Medication Adjustments/Labs and Tests Ordered: Current medicines are reviewed at length with the patient today.  Concerns regarding medicines are outlined above.  No orders of the defined types were placed in this encounter.  No orders of the defined types were placed in this encounter.   Patient Instructions  Medication Instructions:  Decrease Amiodarone  to 100 mg daily Decrease Diltiazem  CD to 120 mg daily  Continue all other medications *If you need a refill on your cardiac medications before your next appointment, please call your pharmacy*  Lab Work: Noe ordered  Testing/Procedures: None ordered  Follow-Up: At [facility], you and your health needs are our priority.  As part of our continuing mission to provide you with exceptional heart care, our providers are all part of one team.  This team includes your primary Cardiologist (physician) and Advanced Practice  Providers or APPs (Physician Assistants and Nurse Practitioners) who all work together to provide you with the care you need, when you need it.  Your next appointment:  4 months Friday 12/12 at 1:30 pm    Provider:  Damien Braver NP   We recommend signing up for the patient portal called MyChart.  Sign up information is provided on this After Visit Summary.  MyChart is used to connect with patients for Virtual Visits (Telemedicine).  Patients are able to view lab/test results, encounter notes, upcoming appointments, etc.  Non-urgent messages can be sent to your provider as well.   To learn more about what you can do with MyChart, go to [email or web address].         Signed, Daniell Mancinas [place], MD  08/22/2023 4:04 PM    [facility]

## 2023-08-21 DIAGNOSIS — G4700 Insomnia, unspecified: Secondary | ICD-10-CM | POA: Diagnosis not present

## 2023-08-21 DIAGNOSIS — N390 Urinary tract infection, site not specified: Secondary | ICD-10-CM | POA: Diagnosis not present

## 2023-08-21 DIAGNOSIS — F5105 Insomnia due to other mental disorder: Secondary | ICD-10-CM | POA: Diagnosis not present

## 2023-08-21 DIAGNOSIS — F3130 Bipolar disorder, current episode depressed, mild or moderate severity, unspecified: Secondary | ICD-10-CM | POA: Diagnosis not present

## 2023-08-21 DIAGNOSIS — M5030 Other cervical disc degeneration, unspecified cervical region: Secondary | ICD-10-CM | POA: Diagnosis not present

## 2023-08-21 DIAGNOSIS — J189 Pneumonia, unspecified organism: Secondary | ICD-10-CM | POA: Diagnosis not present

## 2023-08-22 ENCOUNTER — Ambulatory Visit: Admitting: Family Medicine

## 2023-08-22 ENCOUNTER — Inpatient Hospital Stay: Admitting: Family Medicine

## 2023-08-22 ENCOUNTER — Ambulatory Visit: Attending: Cardiology | Admitting: Cardiology

## 2023-08-22 ENCOUNTER — Encounter: Payer: Self-pay | Admitting: Cardiology

## 2023-08-22 VITALS — BP 105/70 | HR 56 | Ht 64.0 in | Wt 247.0 lb

## 2023-08-22 DIAGNOSIS — I480 Paroxysmal atrial fibrillation: Secondary | ICD-10-CM | POA: Diagnosis not present

## 2023-08-22 DIAGNOSIS — I5032 Chronic diastolic (congestive) heart failure: Secondary | ICD-10-CM | POA: Diagnosis not present

## 2023-08-22 DIAGNOSIS — I10 Essential (primary) hypertension: Secondary | ICD-10-CM | POA: Diagnosis not present

## 2023-08-22 NOTE — Progress Notes (Deleted)
   Established Patient Office Visit  Subjective  Patient ID: Donna Sexton, female    DOB: November 04, 1950  Age: 73 y.o. MRN: [id number]  No chief complaint on file.   HPI  Hospital follow-up-  She was admitted to the [facility] on 7/9 and discharged home on July 17.  She had initially presented with a fall and was undergoing evaluation and became suddenly hypoxic.  She was then diagnosed with left lower lobe pneumonia and UTI.  Treated with IV Rocephin  and azithromycin .  As she improved she was switched to IV cefepime  and then discharged home with a couple of days of Augmentin .  She was discharged home to a SNF.  Also because of mental confusion they did hold her Lyrica  and oxycodone  temporarily and she did improve and become more alert they did restart her Lyrica  at a lower dose.    {History (Optional):23778}  ROS    Objective:     There were no vitals taken for this visit. {Vitals History (Optional):23777}  Physical Exam   No results found for any visits on 08/22/23.  {Labs (Optional):23779}  The 10-year ASCVD risk score (Arnett DK, et al., 2019) is: 23.6%    Assessment & Plan:   Problem List Items Addressed This Visit       Respiratory   Left lower lobe pneumonia - Primary    No follow-ups on file.    [name], MD

## 2023-08-22 NOTE — Patient Instructions (Signed)
 Medication Instructions:  Decrease Amiodarone  to 100 mg daily Decrease Diltiazem  CD to 120 mg daily  Continue all other medications *If you need a refill on your cardiac medications before your next appointment, please call your pharmacy*  Lab Work: Noe ordered  Testing/Procedures: None ordered  Follow-Up: At [facility], you and your health needs are our priority.  As part of our continuing mission to provide you with exceptional heart care, our providers are all part of one team.  This team includes your primary Cardiologist (physician) and Advanced Practice Providers or APPs (Physician Assistants and Nurse Practitioners) who all work together to provide you with the care you need, when you need it.  Your next appointment:  4 months Friday 12/12 at 1:30 pm    Provider:  Damien Braver NP   We recommend signing up for the patient portal called MyChart.  Sign up information is provided on this After Visit Summary.  MyChart is used to connect with patients for Virtual Visits (Telemedicine).  Patients are able to view lab/test results, encounter notes, upcoming appointments, etc.  Non-urgent messages can be sent to your provider as well.   To learn more about what you can do with MyChart, go to [email or web address].

## 2023-08-29 ENCOUNTER — Telehealth: Payer: Self-pay

## 2023-08-29 NOTE — Telephone Encounter (Signed)
 Appointment 08/22/2023 changed to canceled in computer system. Left message on patients voicemail updating spouse on status.

## 2023-08-29 NOTE — Telephone Encounter (Signed)
 Appt 08/22/23 should have canceled instead of NO SHOW, can we look into this?  Copied from CRM #[id number]. Topic: General - Other >> Aug 29, 2023  9:09 AM Kevelyn M wrote: Reason for CRM: Patient's husband calling in because an appointment he called showed as a NO SHOW. He said that appointment should have been cancelled so it should have be counted as a NO SHOW. He requested if this can this be fixed?

## 2023-09-05 ENCOUNTER — Encounter (HOSPITAL_COMMUNITY): Payer: Self-pay

## 2023-09-05 ENCOUNTER — Emergency Department (HOSPITAL_COMMUNITY)

## 2023-09-05 ENCOUNTER — Inpatient Hospital Stay: Admitting: Family Medicine

## 2023-09-05 ENCOUNTER — Other Ambulatory Visit: Payer: Self-pay

## 2023-09-05 ENCOUNTER — Inpatient Hospital Stay (HOSPITAL_COMMUNITY)
Admission: EM | Admit: 2023-09-05 | Discharge: 2023-09-12 | DRG: 291 | Disposition: A | Discharge status: Skilled Nursing Facility | Source: Skilled Nursing Facility | Attending: Internal Medicine | Admitting: Internal Medicine

## 2023-09-05 DIAGNOSIS — Z7951 Long term (current) use of inhaled steroids: Secondary | ICD-10-CM

## 2023-09-05 DIAGNOSIS — N390 Urinary tract infection, site not specified: Secondary | ICD-10-CM | POA: Diagnosis present

## 2023-09-05 DIAGNOSIS — E860 Dehydration: Secondary | ICD-10-CM | POA: Diagnosis not present

## 2023-09-05 DIAGNOSIS — I4891 Unspecified atrial fibrillation: Secondary | ICD-10-CM | POA: Diagnosis not present

## 2023-09-05 DIAGNOSIS — R188 Other ascites: Secondary | ICD-10-CM | POA: Diagnosis not present

## 2023-09-05 DIAGNOSIS — Z79899 Other long term (current) drug therapy: Secondary | ICD-10-CM

## 2023-09-05 DIAGNOSIS — G9341 Metabolic encephalopathy: Secondary | ICD-10-CM | POA: Diagnosis present

## 2023-09-05 DIAGNOSIS — I509 Heart failure, unspecified: Secondary | ICD-10-CM | POA: Diagnosis not present

## 2023-09-05 DIAGNOSIS — K219 Gastro-esophageal reflux disease without esophagitis: Secondary | ICD-10-CM | POA: Diagnosis not present

## 2023-09-05 DIAGNOSIS — Z1152 Encounter for screening for COVID-19: Secondary | ICD-10-CM

## 2023-09-05 DIAGNOSIS — Z6841 Body Mass Index (BMI) 40.0 and over, adult: Secondary | ICD-10-CM | POA: Diagnosis not present

## 2023-09-05 DIAGNOSIS — R110 Nausea: Secondary | ICD-10-CM | POA: Diagnosis not present

## 2023-09-05 DIAGNOSIS — G894 Chronic pain syndrome: Secondary | ICD-10-CM | POA: Diagnosis present

## 2023-09-05 DIAGNOSIS — J9601 Acute respiratory failure with hypoxia: Secondary | ICD-10-CM | POA: Diagnosis present

## 2023-09-05 DIAGNOSIS — Z8249 Family history of ischemic heart disease and other diseases of the circulatory system: Secondary | ICD-10-CM | POA: Diagnosis not present

## 2023-09-05 DIAGNOSIS — Z818 Family history of other mental and behavioral disorders: Secondary | ICD-10-CM

## 2023-09-05 DIAGNOSIS — M8580 Other specified disorders of bone density and structure, unspecified site: Secondary | ICD-10-CM | POA: Diagnosis present

## 2023-09-05 DIAGNOSIS — E66813 Obesity, class 3: Secondary | ICD-10-CM | POA: Diagnosis present

## 2023-09-05 DIAGNOSIS — J90 Pleural effusion, not elsewhere classified: Secondary | ICD-10-CM | POA: Diagnosis not present

## 2023-09-05 DIAGNOSIS — D539 Nutritional anemia, unspecified: Secondary | ICD-10-CM | POA: Diagnosis not present

## 2023-09-05 DIAGNOSIS — Z885 Allergy status to narcotic agent status: Secondary | ICD-10-CM

## 2023-09-05 DIAGNOSIS — Z833 Family history of diabetes mellitus: Secondary | ICD-10-CM

## 2023-09-05 DIAGNOSIS — R0902 Hypoxemia: Secondary | ICD-10-CM

## 2023-09-05 DIAGNOSIS — Z7401 Bed confinement status: Secondary | ICD-10-CM | POA: Diagnosis not present

## 2023-09-05 DIAGNOSIS — U071 COVID-19: Secondary | ICD-10-CM | POA: Diagnosis not present

## 2023-09-05 DIAGNOSIS — J189 Pneumonia, unspecified organism: Secondary | ICD-10-CM | POA: Diagnosis not present

## 2023-09-05 DIAGNOSIS — I480 Paroxysmal atrial fibrillation: Secondary | ICD-10-CM | POA: Diagnosis present

## 2023-09-05 DIAGNOSIS — B952 Enterococcus as the cause of diseases classified elsewhere: Secondary | ICD-10-CM | POA: Diagnosis present

## 2023-09-05 DIAGNOSIS — R1084 Generalized abdominal pain: Secondary | ICD-10-CM | POA: Diagnosis not present

## 2023-09-05 DIAGNOSIS — I517 Cardiomegaly: Secondary | ICD-10-CM | POA: Diagnosis not present

## 2023-09-05 DIAGNOSIS — J9621 Acute and chronic respiratory failure with hypoxia: Secondary | ICD-10-CM | POA: Diagnosis present

## 2023-09-05 DIAGNOSIS — I110 Hypertensive heart disease with heart failure: Secondary | ICD-10-CM | POA: Diagnosis not present

## 2023-09-05 DIAGNOSIS — R0602 Shortness of breath: Principal | ICD-10-CM

## 2023-09-05 DIAGNOSIS — J9600 Acute respiratory failure, unspecified whether with hypoxia or hypercapnia: Secondary | ICD-10-CM | POA: Diagnosis not present

## 2023-09-05 DIAGNOSIS — R531 Weakness: Secondary | ICD-10-CM

## 2023-09-05 DIAGNOSIS — F319 Bipolar disorder, unspecified: Secondary | ICD-10-CM | POA: Diagnosis present

## 2023-09-05 DIAGNOSIS — J45909 Unspecified asthma, uncomplicated: Secondary | ICD-10-CM | POA: Diagnosis not present

## 2023-09-05 DIAGNOSIS — B965 Pseudomonas (aeruginosa) (mallei) (pseudomallei) as the cause of diseases classified elsewhere: Secondary | ICD-10-CM | POA: Diagnosis present

## 2023-09-05 DIAGNOSIS — I890 Lymphedema, not elsewhere classified: Secondary | ICD-10-CM | POA: Diagnosis present

## 2023-09-05 DIAGNOSIS — I5033 Acute on chronic diastolic (congestive) heart failure: Secondary | ICD-10-CM | POA: Diagnosis not present

## 2023-09-05 DIAGNOSIS — R9082 White matter disease, unspecified: Secondary | ICD-10-CM | POA: Diagnosis not present

## 2023-09-05 DIAGNOSIS — R54 Age-related physical debility: Secondary | ICD-10-CM | POA: Diagnosis present

## 2023-09-05 DIAGNOSIS — U099 Post covid-19 condition, unspecified: Secondary | ICD-10-CM | POA: Diagnosis present

## 2023-09-05 DIAGNOSIS — I10 Essential (primary) hypertension: Secondary | ICD-10-CM | POA: Diagnosis present

## 2023-09-05 DIAGNOSIS — J9811 Atelectasis: Secondary | ICD-10-CM | POA: Diagnosis not present

## 2023-09-05 DIAGNOSIS — R509 Fever, unspecified: Secondary | ICD-10-CM | POA: Diagnosis not present

## 2023-09-05 DIAGNOSIS — E785 Hyperlipidemia, unspecified: Secondary | ICD-10-CM | POA: Diagnosis not present

## 2023-09-05 DIAGNOSIS — Z8261 Family history of arthritis: Secondary | ICD-10-CM

## 2023-09-05 DIAGNOSIS — Z7901 Long term (current) use of anticoagulants: Secondary | ICD-10-CM

## 2023-09-05 DIAGNOSIS — Z9882 Breast implant status: Secondary | ICD-10-CM

## 2023-09-05 DIAGNOSIS — R4182 Altered mental status, unspecified: Secondary | ICD-10-CM | POA: Diagnosis not present

## 2023-09-05 DIAGNOSIS — N3281 Overactive bladder: Secondary | ICD-10-CM | POA: Diagnosis present

## 2023-09-05 DIAGNOSIS — Z90710 Acquired absence of both cervix and uterus: Secondary | ICD-10-CM

## 2023-09-05 DIAGNOSIS — R079 Chest pain, unspecified: Secondary | ICD-10-CM | POA: Diagnosis not present

## 2023-09-05 DIAGNOSIS — Z87440 Personal history of urinary (tract) infections: Secondary | ICD-10-CM

## 2023-09-05 LAB — CBC WITH DIFFERENTIAL/PLATELET
Abs Immature Granulocytes: 0.08 K/uL — ABNORMAL HIGH (ref 0.00–0.07)
Basophils Absolute: 0 K/uL (ref 0.0–0.1)
Basophils Relative: 0 %
Eosinophils Absolute: 0.1 K/uL (ref 0.0–0.5)
Eosinophils Relative: 1 %
HCT: 35 % — ABNORMAL LOW (ref 36.0–46.0)
Hemoglobin: 11.1 g/dL — ABNORMAL LOW (ref 12.0–15.0)
Immature Granulocytes: 1 %
Lymphocytes Relative: 16 %
Lymphs Abs: 1.5 K/uL (ref 0.7–4.0)
MCH: 33.6 pg (ref 26.0–34.0)
MCHC: 31.7 g/dL (ref 30.0–36.0)
MCV: 106.1 fL — ABNORMAL HIGH (ref 80.0–100.0)
Monocytes Absolute: 1.9 K/uL — ABNORMAL HIGH (ref 0.1–1.0)
Monocytes Relative: 20 %
Neutro Abs: 6.1 K/uL (ref 1.7–7.7)
Neutrophils Relative %: 62 %
Platelets: 184 K/uL (ref 150–400)
RBC: 3.3 MIL/uL — ABNORMAL LOW (ref 3.87–5.11)
RDW: 14.4 % (ref 11.5–15.5)
WBC: 9.8 K/uL (ref 4.0–10.5)
nRBC: 0 % (ref 0.0–0.2)

## 2023-09-05 LAB — COMPREHENSIVE METABOLIC PANEL WITH GFR
ALT: 14 U/L (ref 0–44)
AST: 15 U/L (ref 15–41)
Albumin: 3.2 g/dL — ABNORMAL LOW (ref 3.5–5.0)
Alkaline Phosphatase: 51 U/L (ref 38–126)
Anion gap: 10 (ref 5–15)
BUN: 12 mg/dL (ref 8–23)
CO2: 26 mmol/L (ref 22–32)
Calcium: 8.7 mg/dL — ABNORMAL LOW (ref 8.9–10.3)
Chloride: 99 mmol/L (ref 98–111)
Creatinine, Ser: 0.64 mg/dL (ref 0.44–1.00)
GFR, Estimated: >60 mL/min (ref >60)
Glucose, Bld: 95 mg/dL (ref 70–99)
Potassium: 3.9 mmol/L (ref 3.5–5.1)
Sodium: 135 mmol/L (ref 135–145)
Total Bilirubin: 1 mg/dL (ref 0.0–1.2)
Total Protein: 5.9 g/dL — ABNORMAL LOW (ref 6.5–8.1)

## 2023-09-05 LAB — RESP PANEL BY RT-PCR (RSV, FLU A&B, COVID)  RVPGX2
Influenza A by PCR: NEGATIVE
Influenza B by PCR: NEGATIVE
Resp Syncytial Virus by PCR: NEGATIVE
SARS Coronavirus 2 by RT PCR: NEGATIVE

## 2023-09-05 LAB — BLOOD GAS, VENOUS
Acid-Base Excess: 5.8 mmol/L — ABNORMAL HIGH (ref 0.0–2.0)
Bicarbonate: 32.5 mmol/L — ABNORMAL HIGH (ref 20.0–28.0)
O2 Saturation: 62.5 %
Patient temperature: 37
pCO2, Ven: 55 mmHg (ref 44–60)
pH, Ven: 7.38 (ref 7.25–7.43)
pO2, Ven: 37 mmHg (ref 32–45)

## 2023-09-05 LAB — TROPONIN I (HIGH SENSITIVITY): Troponin I (High Sensitivity): 7 ng/L (ref <18)

## 2023-09-05 LAB — BRAIN NATRIURETIC PEPTIDE: B Natriuretic Peptide: 510.9 pg/mL — ABNORMAL HIGH (ref 0.0–100.0)

## 2023-09-05 LAB — CBG MONITORING, ED: Glucose-Capillary: 97 mg/dL (ref 70–99)

## 2023-09-05 LAB — AMMONIA: Ammonia: 28 umol/L (ref 9–35)

## 2023-09-05 MED ORDER — METOPROLOL TARTRATE 25 MG PO TABS
25.0000 mg | ORAL_TABLET | Freq: Two times a day (BID) | ORAL | Status: DC
  Administered 2023-09-05 – 2023-09-12 (×13): 25 mg via ORAL
  Filled 2023-09-05 (×13): qty 1

## 2023-09-05 MED ORDER — MIRABEGRON ER 25 MG PO TB24
25.0000 mg | ORAL_TABLET | Freq: Every day | ORAL | Status: DC
  Administered 2023-09-05 – 2023-09-11 (×6): 25 mg via ORAL
  Filled 2023-09-05 (×8): qty 1

## 2023-09-05 MED ORDER — DILTIAZEM HCL ER COATED BEADS 120 MG PO CP24
120.0000 mg | ORAL_CAPSULE | Freq: Every day | ORAL | Status: DC
  Administered 2023-09-05 – 2023-09-12 (×7): 120 mg via ORAL
  Filled 2023-09-05 (×7): qty 1

## 2023-09-05 MED ORDER — APIXABAN 2.5 MG PO TABS
5.0000 mg | ORAL_TABLET | Freq: Two times a day (BID) | ORAL | Status: DC
  Administered 2023-09-05 – 2023-09-12 (×13): 5 mg via ORAL
  Filled 2023-09-05 (×13): qty 2

## 2023-09-05 MED ORDER — SENNOSIDES-DOCUSATE SODIUM 8.6-50 MG PO TABS
2.0000 | ORAL_TABLET | Freq: Every day | ORAL | Status: DC
  Administered 2023-09-05 – 2023-09-10 (×5): 2 via ORAL
  Filled 2023-09-05 (×6): qty 2

## 2023-09-05 MED ORDER — AMIODARONE HCL 100 MG PO TABS
100.0000 mg | ORAL_TABLET | Freq: Every day | ORAL | Status: DC
  Administered 2023-09-05 – 2023-09-12 (×7): 100 mg via ORAL
  Filled 2023-09-05 (×8): qty 1

## 2023-09-05 MED ORDER — ONDANSETRON HCL 4 MG/2ML IJ SOLN
4.0000 mg | Freq: Four times a day (QID) | INTRAMUSCULAR | Status: DC | PRN
  Administered 2023-09-05 – 2023-09-09 (×7): 4 mg via INTRAVENOUS
  Filled 2023-09-05 (×7): qty 2

## 2023-09-05 MED ORDER — POLYETHYLENE GLYCOL 3350 17 G PO PACK
17.0000 g | PACK | Freq: Every day | ORAL | Status: DC
  Administered 2023-09-08 – 2023-09-10 (×3): 17 g via ORAL
  Filled 2023-09-05 (×5): qty 1

## 2023-09-05 MED ORDER — DIVALPROEX SODIUM ER 500 MG PO TB24
1500.0000 mg | ORAL_TABLET | Freq: Every day | ORAL | Status: DC
  Administered 2023-09-05 – 2023-09-11 (×6): 1500 mg via ORAL
  Filled 2023-09-05 (×7): qty 3

## 2023-09-05 MED ORDER — BISACODYL 10 MG RE SUPP: 10.0000 mg | Freq: Every day | RECTAL | Status: DC | PRN

## 2023-09-05 MED ORDER — ATORVASTATIN CALCIUM 40 MG PO TABS
40.0000 mg | ORAL_TABLET | Freq: Every day | ORAL | Status: DC
  Administered 2023-09-05 – 2023-09-12 (×7): 40 mg via ORAL
  Filled 2023-09-05 (×7): qty 1

## 2023-09-05 MED ORDER — POTASSIUM CHLORIDE CRYS ER 20 MEQ PO TBCR
20.0000 meq | EXTENDED_RELEASE_TABLET | Freq: Every day | ORAL | Status: DC
  Administered 2023-09-05 – 2023-09-12 (×7): 20 meq via ORAL
  Filled 2023-09-05 (×7): qty 1

## 2023-09-05 MED ORDER — FUROSEMIDE 10 MG/ML IJ SOLN
40.0000 mg | Freq: Two times a day (BID) | INTRAMUSCULAR | Status: DC
  Administered 2023-09-05 – 2023-09-06 (×2): 40 mg via INTRAVENOUS
  Filled 2023-09-05 (×2): qty 4

## 2023-09-05 MED ORDER — FAMOTIDINE 20 MG PO TABS
20.0000 mg | ORAL_TABLET | Freq: Every day | ORAL | Status: DC
  Administered 2023-09-05: 20 mg via ORAL
  Filled 2023-09-05: qty 1

## 2023-09-05 MED ORDER — TRAZODONE HCL 50 MG PO TABS: 50.0000 mg | ORAL_TABLET | Freq: Every evening | ORAL | Status: DC | PRN

## 2023-09-05 MED ORDER — ACETAMINOPHEN 650 MG RE SUPP: 650.0000 mg | Freq: Four times a day (QID) | RECTAL | Status: DC | PRN

## 2023-09-05 MED ORDER — FLUTICASONE FUROATE-VILANTEROL 100-25 MCG/ACT IN AEPB
1.0000 | INHALATION_SPRAY | Freq: Every day | RESPIRATORY_TRACT | Status: DC
  Administered 2023-09-06 – 2023-09-12 (×7): 1 via RESPIRATORY_TRACT
  Filled 2023-09-05: qty 28

## 2023-09-05 MED ORDER — ONDANSETRON HCL 4 MG PO TABS
4.0000 mg | ORAL_TABLET | Freq: Four times a day (QID) | ORAL | Status: DC | PRN
  Administered 2023-09-10: 4 mg via ORAL
  Filled 2023-09-05: qty 1

## 2023-09-05 MED ORDER — FUROSEMIDE 10 MG/ML IJ SOLN
60.0000 mg | Freq: Once | INTRAMUSCULAR | Status: AC
  Administered 2023-09-05: 60 mg via INTRAVENOUS
  Filled 2023-09-05: qty 6

## 2023-09-05 MED ORDER — PREGABALIN 50 MG PO CAPS
50.0000 mg | ORAL_CAPSULE | Freq: Three times a day (TID) | ORAL | Status: DC
  Administered 2023-09-05: 50 mg via ORAL
  Filled 2023-09-05: qty 1

## 2023-09-05 MED ORDER — ACETAMINOPHEN 325 MG PO TABS
650.0000 mg | ORAL_TABLET | Freq: Four times a day (QID) | ORAL | Status: DC | PRN
  Administered 2023-09-05 – 2023-09-11 (×3): 650 mg via ORAL
  Filled 2023-09-05 (×3): qty 2

## 2023-09-05 MED ORDER — LEVALBUTEROL HCL 0.63 MG/3ML IN NEBU: 0.6300 mg | INHALATION_SOLUTION | Freq: Four times a day (QID) | RESPIRATORY_TRACT | Status: DC | PRN

## 2023-09-05 NOTE — ED Triage Notes (Signed)
 Pt BIB [facility] from [facility] w/ cc of mid CP x1 day with sob.Per medics Pt's O2 sats were 85%RA on arrival and placed on 3L [place] at that time. Pt very lethargic during triage.  GCS:15 99.2-32-148/72-97% 4L [place] 20G R-Hand

## 2023-09-05 NOTE — H&P (Signed)
 History and Physical    Patient: Donna Sexton FMW:[id number] DOB: [birth year] DOA: 09/05/2023 DOS: the patient was seen and examined on 09/05/2023 PCP: Alvan Dorothyann BIRCH, MD  Patient coming from: Home  Chief Complaint:  Chief Complaint  Patient presents with   Shortness of Breath   HPI: Donna Sexton is a 73 y.o. female with medical history significant of respiratory failure due to COVID-19, unspecified anemia, anxiety, bipolar disorder, asthma, encephalopathy due to infection, GERD, IBS, hyperlipidemia, hypertension, osteopenia, overactive bladder, sleep apnea, prediabetes, class III obesity, UTI, paroxysmal atrial fibrillation, chronic diastolic heart failure who was sent from her nursing facility due to hypotension and lethargy.  When EMS arrived, she was found to be hypoxic in the 80s.  No chest pain, palpitations, diaphoresis, PND, but positive orthopnea.  She denied fever, chills, rhinorrhea, sore throat, wheezing or hemoptysis.  No abdominal pain, nausea, emesis, diarrhea, constipation, melena or hematochezia.  No flank pain, dysuria, frequency or hematuria.  No polyuria, polydipsia, polyphagia or blurred vision.   ED course: Initial vital signs were temperature 99.8 F, pulse 112, respirations 22, BP 145/100 mmHg and O2 sat 98% on room air.  The patient received furosemide  60 mg IVP x 1.  Lab work: CBC showed a white count of 9.8, hemoglobin of 11.1 g/dL and platelets 815.  Coronavirus, influenza and RSV PCR was negative.  BNP 511 pg/mL and troponin 7 ng/L.  CMP showed a total protein of 5.9 and albumin of 3.2 g/dL, the rest of the LFTs, electrolytes, glucose and renal function were normal after calcium  correction.  Imaging: Portable 1 view chest radiograph showing cardiomegaly with retrocardiac atelectasis.  Pulmonary vascular prominence without overt edema.  CT head without contrast no acute intracranial normality.  Age-related atrophy and mild periventricular white matter  disease.   Review of Systems: As mentioned in the history of present illness. All other systems reviewed and are negative. Past Medical History:  Diagnosis Date   Acute hypoxemic respiratory failure due to COVID-19 [facility]) 03/01/2023   Allergy Morphine   ANEMIA 11/12/2007   Qualifier: Diagnosis of  By: Sherron CMA, Steen     Anxiety    Arthritis    ASTHMA NOS W/ACUTE EXACERBATION 01/13/2010   Qualifier: Diagnosis of  By: Joshua MD, Debby LITTIE.    BIPOLAR DISORDER UNSPECIFIED 10/29/2007   Qualifier: Diagnosis of  By: Joshua MD, Debby LITTIE.    CHF (congestive heart failure) (HCC)    COMMON MIGRAINE 12/29/2008   Qualifier: Diagnosis of  By: Joshua MD, Debby LITTIE.    Encephalopathy due to infection 04/13/2023   GERD 07/28/2009   Qualifier: Diagnosis of  By: Inocencio MD, [place] LABOR    HYPERLIPIDEMIA 10/29/2007   Qualifier: Diagnosis of  By: Joshua MD, Debby LITTIE.    HYPERTENSION, BENIGN ESSENTIAL 10/29/2007   Qualifier: Diagnosis of  By: Joshua MD, Debby LITTIE.    Orbital mass, right 03/13/2023   OSTEOPENIA 10/30/2007   Qualifier: Diagnosis of  By: Joshua MD, Debby LITTIE.    OVERACTIVE BLADDER 02/21/2008   Qualifier: Diagnosis of  By: Norleen MD, [place] ORN    SLEEP APNEA 12/31/2009   Qualifier: Diagnosis of  By: Joshua MD, Debby LITTIE.    Trigger finger, left little finger 03/15/2017   Past Surgical History:  Procedure Laterality Date   ABDOMINAL HYSTERECTOMY     BREAST IMPLANT REMOVAL Bilateral 2022   BREAST SURGERY  01/10/1978   implants bilateral   CARPAL TUNNEL RELEASE     CHOLECYSTECTOMY  COSMETIC SURGERY     EYE SURGERY  cataract both eyes   HERNIA REPAIR     JOINT REPLACEMENT     PAROTID ENDOSCOPY     SPINE SURGERY  lower back   TUBAL LIGATION     Social History:  reports that she has never smoked. She has never used smokeless tobacco. She reports that she does not drink alcohol and does not use drugs.  Allergies  Allergen Reactions   Morphine Nausea And Vomiting    Family History   Problem Relation Age of Onset   Heart attack Mother    Hypertension Mother    Heart failure Mother    Anxiety disorder Mother    Heart attack Father    Hypertension Father    Diabetes Father    Arthritis Father    Heart failure Brother    Alcohol abuse Maternal Uncle    Alcohol abuse Maternal Uncle    Breast cancer Neg Hx     Prior to Admission medications   Medication Sig Start Date End Date Taking? Authorizing Provider  acetaminophen  (TYLENOL ) 500 MG tablet Take 1 tablet (500 mg total) by mouth every 6 (six) hours as needed for mild pain (pain score 1-3) (or Fever >/= 101). Patient taking differently: Take 1,000 mg by mouth 3 (three) times daily. 03/10/23  Yes Rai, Ripudeep K, MD  aluminum-magnesium  hydroxide 200-200 MG/5ML suspension Take 30 mLs by mouth once.   Yes [provider]  amiodarone  (PACERONE ) 100 MG tablet Take 100 mg by mouth daily.   Yes [provider]  apixaban  (ELIQUIS ) 5 MG TABS tablet Take 1 tablet (5 mg total) by mouth 2 (two) times daily. 06/20/23  Yes Alvan Dorothyann BIRCH, MD  atorvastatin  (LIPITOR) 40 MG tablet Take 1 tablet (40 mg total) by mouth daily. 05/22/23  Yes Alvan Dorothyann BIRCH, MD  bisacodyl  (DULCOLAX) 10 MG suppository Place 10 mg rectally daily as needed for moderate constipation.   Yes [provider]  diltiazem  (CARDIZEM  CD) 120 MG 24 hr capsule Take 120 mg by mouth daily.   Yes [provider]  divalproex  (DEPAKOTE  ER) 500 MG 24 hr tablet Take 3 tablets (1,500 mg total) by mouth at bedtime. 09/01/22  Yes Cottle, Lorene KANDICE Raddle., MD  famotidine  (PEPCID ) 20 MG tablet Take 20 mg by mouth daily.   Yes [provider]  furosemide  (LASIX ) 40 MG tablet Take 1 tablet (40 mg total) by mouth daily. Patient taking differently: Take 80 mg by mouth daily. 05/17/23  Yes Fountain, Madison L, NP  HYDROcodone -acetaminophen  (NORCO/VICODIN) 5-325 MG tablet Take 0.5 tablets by mouth every 6 (six) hours. 08/02/23  Yes [provider]  levalbuterol  (XOPENEX ) 0.63 MG/3ML nebulizer solution Take 3 mLs (0.63 mg total) by nebulization every 6 (six) hours as needed for wheezing or shortness of breath. 03/10/23  Yes Rai, Ripudeep K, MD  metoprolol  tartrate (LOPRESSOR ) 25 MG tablet Take 1 tablet (25 mg total) by mouth 2 (two) times daily. 07/07/23 10/05/23 Yes Terra Fairy PARAS, PA-C  mirabegron  ER (MYRBETRIQ ) 25 MG TB24 tablet Take 25 mg by mouth at bedtime.   Yes [provider]  mometasone -formoterol  (DULERA ) 100-5 MCG/ACT AERO Inhale 2 puffs into the lungs 2 (two) times daily. 03/10/23  Yes Rai, Ripudeep K, MD  ondansetron  (ZOFRAN ) 4 MG tablet Take 1 tablet (4 mg total) by mouth every 6 (six) hours as needed for nausea. 07/27/23  Yes Sebastian Toribio GAILS, MD  polyethylene glycol (MIRALAX  / GLYCOLAX ) 17 g  packet Take 17 g by mouth daily as needed for moderate constipation. Patient taking differently: Take 17 g by mouth daily. 04/19/23  Yes Dahal, Chapman, MD  potassium chloride  SA (KLOR-CON  M) 20 MEQ tablet Take 1 tablet (20 mEq total) by mouth daily. 06/28/23  Yes Alvan Dorothyann BIRCH, MD  pregabalin  (LYRICA ) 50 MG capsule Take 1 capsule (50 mg total) by mouth 2 (two) times daily. Patient taking differently: Take 50 mg by mouth 3 (three) times daily. 07/27/23  Yes Sebastian Toribio GAILS, MD  promethazine  (PHENERGAN ) 25 MG tablet TAKE ONE TABLET BY MOUTH EVERY 8 HOURS AS NEEDED FOR NAUSEA AND VOMITING 05/17/23  Yes Alvan Dorothyann BIRCH, MD  senna-docusate (SENOKOT-S) 8.6-50 MG tablet Take 2 tablets by mouth at bedtime. 03/10/23  Yes Rai, Ripudeep K, MD  traZODone  (DESYREL ) 50 MG tablet Take 50 mg by mouth at bedtime as needed for sleep.   Yes [provider]  cefTRIAXone  (ROCEPHIN ) 1 g injection Inject 1 g into the muscle once. Patient not taking: Reported on 09/05/2023    [provider]  cephALEXin  (KEFLEX ) 500 MG capsule Take 500 mg by mouth 2 (two) times daily. Patient not taking: Reported on 09/05/2023     [provider]  oxybutynin  (DITROPAN ) 5 MG tablet Take 1 tablet (5 mg total) by mouth 2 (two) times daily. Patient not taking: Reported on 09/05/2023 05/23/23   Alvan Dorothyann BIRCH, MD    Physical Exam: Vitals:   09/05/23 0634 09/05/23 0644 09/05/23 0715  BP: (!) 145/100  (!) 127/90  Pulse: (!) 112  97  Resp: (!) 22  (!) 23  Temp: 99.8 F (37.7 C)    TempSrc: Oral    SpO2: 98%  100%  Weight:  112 kg   Height:  5' 4 (1.626 m)    Physical Exam Vitals reviewed.  Constitutional:      General: She is awake. She is not in acute distress.    Appearance: She is morbidly obese. She is ill-appearing.     Interventions: Nasal cannula in place.  HENT:     Head: Normocephalic.     Nose: No rhinorrhea.     Mouth/Throat:     Mouth: Mucous membranes are moist.  Eyes:     General: No scleral icterus.    Pupils: Pupils are equal, round, and reactive to light.  Neck:     Vascular: No JVD.  Cardiovascular:     Rate and Rhythm: Normal rate and regular rhythm.     Heart sounds: S1 normal and S2 normal.  Pulmonary:     Effort: Pulmonary effort is normal. No accessory muscle usage.     Breath sounds: Rales present. No wheezing or rhonchi.  Abdominal:     General: Bowel sounds are normal. There is no distension.     Palpations: Abdomen is soft.     Tenderness: There is no abdominal tenderness. There is no right CVA tenderness or left CVA tenderness.  Musculoskeletal:     Cervical back: Neck supple.     Right lower leg: 3+ Edema present.     Left lower leg: 3+ Edema present.  Skin:    General: Skin is warm and dry.  Neurological:     General: No focal deficit present.     Mental Status: She is alert and oriented to person, place, and time.  Psychiatric:        Mood and Affect: Mood normal.        Behavior: Behavior normal. Behavior  is cooperative.     Data Reviewed:  Results are pending, will review when available.  March 02, 2023 echocardiogram  report. IMPRESSIONS:   1. Left ventricular ejection fraction, by estimation, is 70 to 75%. The  left ventricle has hyperdynamic function. Left ventricular endocardial  border not optimally defined to evaluate regional wall motion. Left  ventricular diastolic parameters are  indeterminate.   2. Right ventricular systolic function is normal. The right ventricular  size is normal. Tricuspid regurgitation signal is inadequate for assessing  PA pressure.   3. The mitral valve was not well visualized. No evidence of mitral valve  regurgitation. No evidence of mitral stenosis.   4. The aortic valve was not well visualized. Aortic valve regurgitation  is not visualized. No aortic stenosis is present.   5. The inferior vena cava is normal in size with greater than 50%  respiratory variability, suggesting right atrial pressure of 3 mmHg.   EKG: Vent. rate 102 BPM PR interval * ms QRS duration 86 ms QT/QTcB 296/386 ms P-R-T axes * 67 -75 Atrial fibrillation Low voltage, precordial leads  Assessment and Plan: Principal Problem:   Generalized weakness Secondary to:   Acute respiratory failure with hypoxia (HCC) In the setting of:   Acute on chronic diastolic CHF (congestive heart failure) (HCC) Observation/telemetry. Supplemental oxygen as needed. Sodium and fluid restriction. Continue furosemide  40 mg IVP twice daily. Continue beta-blocker twice a day. Monitor daily weights, intake and output. Monitor renal function and electrolytes. Check transthoracic echocardiogram.  Active Problems:   GERD Continue famotidine  20 mg p.o. daily.    Hyperlipidemia Continue atorvastatin  40 mg p.o. daily.    Essential hypertension Continue metoprolol  twice daily. Continue furosemide  as above.    Paroxysmal atrial fibrillation (HCC) CHA?DS?-VASc Score of at least 7. Continue apixaban  5 mg p.o. twice daily. Continue amiodarone  100 mg p.o. daily. Continue metoprolol  tartrate 25 mg p.o. twice  daily.    Macrocytic anemia Monitor hematocrit and hemoglobin. Follow-up with primary care provider.    Class 3 obesity Current BMI 42.40 kg/m. Would benefit from lifestyle modifications. Follow-up closely with PCP and/or bariatric clinic.    Advance Care Planning:   Code Status: Full Code   Consults:   Family Communication:   Severity of Illness: The appropriate patient status for this patient is OBSERVATION. Observation status is judged to be reasonable and necessary in order to provide the required intensity of service to ensure the patient's safety. The patient's presenting symptoms, physical exam findings, and initial radiographic and laboratory data in the context of their medical condition is felt to place them at decreased risk for further clinical deterioration. Furthermore, it is anticipated that the patient will be medically stable for discharge from the hospital within 2 midnights of admission.   Author: Alm Dorn Castor, MD 09/05/2023 9:34 AM  For on call review [email or web address].   This document was prepared using Dragon voice recognition software and may contain some unintended transcription errors.

## 2023-09-05 NOTE — Progress Notes (Signed)
 Pt voided large amount of unmeasured urine s/p Lasix  administration. Urine occurrence charted. Complete bed change and purewick exchange completed.

## 2023-09-05 NOTE — ED Provider Notes (Signed)
 Clinical Course as of 09/05/23 0937  Tue Sep 05, 2023  0735 DG Chest Portable 1 View IMPRESSION: Cardiomegaly with retrocardiac atelectasis. Pulmonary vascular prominence without overt edema.   Electronically Signed   By: Marolyn JONETTA Jaksch M.D.   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 Afib has been managed with amiodarone .  [TY]  [id number] Patient evaluated, she is sleepy, but will wake easily to voice and is oriented x 3.  No focal deficits.  Impressive lower extremity edema.  Does have a history of CHF her BNP is elevated 510, chest x-ray with some vascular congestion.  Lasix  ordered.  Given her reported somnolence/altered mental status CT head obtained, negative.  Flu COVID-negative.  No significant metabolic derangements.  Case discussed with hospitalist who agrees to admit patient. [TY]    Clinical Course User Index [TY] Neysa Caron PARAS, DO   Received signout; dispo pending labs and imaging.  See prior team's note for full HPI.   Neysa Caron PARAS, DO 09/05/23 [phone]

## 2023-09-05 NOTE — ED Provider Notes (Signed)
 [place] EMERGENCY DEPARTMENT AT [facility] Provider Note   CSN: [id number] Arrival date & time: 09/05/23  9381     History Chief Complaint  Patient presents with   Shortness of Breath    HPI Donna Sexton , a 73 y.o. female  was evaluated in triage.  Pt complains of shortness of breath.  Brought in by EMS from [facility] facility.  Was confused, laying facedown in her bed.  Found to be hypoxic to the mid 80s.  Started on nasal cannula, gradually improved during transport now back to her baseline.  Does not wear oxygen at baseline.  History of sleep apnea, hypertension, hypoxic respiratory failure secondary to pneumonia.  Patient's recorded medical, surgical, social, medication list and allergies were reviewed in the Snapshot window as part of the initial history.   Review of Systems   Review of Systems  Constitutional:  Negative for chills and fever.  HENT:  Negative for ear pain and sore throat.   Eyes:  Negative for pain and visual disturbance.  Respiratory:  Positive for shortness of breath. Negative for cough.   Cardiovascular:  Negative for chest pain and palpitations.  Gastrointestinal:  Negative for abdominal pain and vomiting.  Genitourinary:  Negative for dysuria and hematuria.  Musculoskeletal:  Negative for arthralgias and back pain.  Skin:  Negative for color change and rash.  Neurological:  Negative for seizures and syncope.  All other systems reviewed and are negative.   Physical Exam Updated Vital Signs BP (!) 145/100   Pulse (!) 112   Temp 99.8 F (37.7 C) (Oral)   Resp (!) 22   Ht 5' 4 (1.626 m)   Wt 112 kg   SpO2 98%   BMI 42.40 kg/m  Physical Exam Vitals and nursing note reviewed.  Constitutional:      General: She is not in acute distress.    Appearance: She is well-developed.  HENT:     Head: Normocephalic and atraumatic.  Eyes:     Conjunctiva/sclera: Conjunctivae normal.  Cardiovascular:     Rate and Rhythm:  Normal rate and regular rhythm.     Heart sounds: No murmur heard. Pulmonary:     Effort: Respiratory distress present.     Breath sounds: Normal breath sounds.  Abdominal:     General: There is no distension.     Palpations: Abdomen is soft.     Tenderness: There is no abdominal tenderness. There is no right CVA tenderness or left CVA tenderness.  Musculoskeletal:        General: No swelling or tenderness. Normal range of motion.     Cervical back: Neck supple.  Skin:    General: Skin is warm and dry.  Neurological:     General: No focal deficit present.     Mental Status: She is alert and oriented to person, place, and time. Mental status is at baseline.     Cranial Nerves: No cranial nerve deficit.      ED Course/ Medical Decision Making/ A&P    Procedures Procedures   Medications Ordered in ED Medications - No data to display  Medical Decision Making:   Donna Sexton is a 73 y.o. female who presented to the ED today with SOB detailed above.    Patient placed on continuous vitals and telemetry monitoring while in ED which was reviewed periodically.  Complete initial physical exam performed, notably the patient  was back to baseline on [facility].    Reviewed  and confirmed nursing documentation for past medical history, family history, social history.    Initial Assessment:   With the patient's presentation of SOB, most likely diagnosis is asthma exacerbation vs COPD. Other diagnoses were considered including (but not limited to) ACS/PE/PNX/CHF. These are considered less likely due to history of present illness and physical exam findings.   This is most consistent with an acute life/limb threatening illness complicated by underlying chronic conditions.  Initial Plan:  Screening labs including CBC and Metabolic panel to evaluate for infectious or metabolic etiology of disease.  Urinalysis with reflex culture ordered to evaluate for UTI or relevant urologic/nephrologic  pathology.  CXR to evaluate for structural/infectious intrathoracic pathology.  EKG/Troponin testing/BNP testing to evaluate for cardiac pathology.   I was called emergently to bedside due to acute hypoxic respiratory failure.  She is stabilizing well on nasal cannula though has a temperature of 99.8, tachycardic to 112 respirations and 22.  Uncertain if this is representative of acute hypoxic respiratory failure secondary to pneumonia, heart failure exacerbation at this time. She is stabilizing appropriately on oxygen therapy and is not hypotensive.  Can allow for lab workup as above to further differentiate the etiology of patient's symptoms prior to initiating therapy.  Care patient signed out to oncoming team to follow-up the studies and for appropriate care.    Clinical Impression:  1. SOB (shortness of breath)   2. Hypoxia      Data Unavailable   Final Clinical Impression(s) / ED Diagnoses Final diagnoses:  SOB (shortness of breath)  Hypoxia    Rx / DC Orders ED Discharge Orders     None         Jerral Meth, MD 09/05/23 [phone]

## 2023-09-06 DIAGNOSIS — Z885 Allergy status to narcotic agent status: Secondary | ICD-10-CM | POA: Diagnosis not present

## 2023-09-06 DIAGNOSIS — G894 Chronic pain syndrome: Secondary | ICD-10-CM | POA: Diagnosis present

## 2023-09-06 DIAGNOSIS — Z6841 Body Mass Index (BMI) 40.0 and over, adult: Secondary | ICD-10-CM | POA: Diagnosis not present

## 2023-09-06 DIAGNOSIS — I5033 Acute on chronic diastolic (congestive) heart failure: Secondary | ICD-10-CM | POA: Diagnosis present

## 2023-09-06 DIAGNOSIS — Z79899 Other long term (current) drug therapy: Secondary | ICD-10-CM | POA: Diagnosis not present

## 2023-09-06 DIAGNOSIS — K219 Gastro-esophageal reflux disease without esophagitis: Secondary | ICD-10-CM | POA: Diagnosis present

## 2023-09-06 DIAGNOSIS — Z1152 Encounter for screening for COVID-19: Secondary | ICD-10-CM | POA: Diagnosis not present

## 2023-09-06 DIAGNOSIS — Z8249 Family history of ischemic heart disease and other diseases of the circulatory system: Secondary | ICD-10-CM | POA: Diagnosis not present

## 2023-09-06 DIAGNOSIS — E860 Dehydration: Secondary | ICD-10-CM | POA: Diagnosis present

## 2023-09-06 DIAGNOSIS — N390 Urinary tract infection, site not specified: Secondary | ICD-10-CM | POA: Diagnosis present

## 2023-09-06 DIAGNOSIS — E66813 Obesity, class 3: Secondary | ICD-10-CM | POA: Diagnosis present

## 2023-09-06 DIAGNOSIS — F319 Bipolar disorder, unspecified: Secondary | ICD-10-CM | POA: Diagnosis present

## 2023-09-06 DIAGNOSIS — B965 Pseudomonas (aeruginosa) (mallei) (pseudomallei) as the cause of diseases classified elsewhere: Secondary | ICD-10-CM | POA: Diagnosis present

## 2023-09-06 DIAGNOSIS — J45909 Unspecified asthma, uncomplicated: Secondary | ICD-10-CM | POA: Diagnosis present

## 2023-09-06 DIAGNOSIS — E785 Hyperlipidemia, unspecified: Secondary | ICD-10-CM | POA: Diagnosis present

## 2023-09-06 DIAGNOSIS — J9621 Acute and chronic respiratory failure with hypoxia: Secondary | ICD-10-CM | POA: Diagnosis present

## 2023-09-06 DIAGNOSIS — B952 Enterococcus as the cause of diseases classified elsewhere: Secondary | ICD-10-CM | POA: Diagnosis present

## 2023-09-06 DIAGNOSIS — D539 Nutritional anemia, unspecified: Secondary | ICD-10-CM | POA: Diagnosis present

## 2023-09-06 DIAGNOSIS — J9601 Acute respiratory failure with hypoxia: Secondary | ICD-10-CM | POA: Diagnosis not present

## 2023-09-06 DIAGNOSIS — G9341 Metabolic encephalopathy: Secondary | ICD-10-CM | POA: Diagnosis present

## 2023-09-06 DIAGNOSIS — Z7951 Long term (current) use of inhaled steroids: Secondary | ICD-10-CM | POA: Diagnosis not present

## 2023-09-06 DIAGNOSIS — I110 Hypertensive heart disease with heart failure: Secondary | ICD-10-CM | POA: Diagnosis present

## 2023-09-06 DIAGNOSIS — I480 Paroxysmal atrial fibrillation: Secondary | ICD-10-CM | POA: Diagnosis present

## 2023-09-06 DIAGNOSIS — R0602 Shortness of breath: Secondary | ICD-10-CM | POA: Diagnosis present

## 2023-09-06 DIAGNOSIS — Z818 Family history of other mental and behavioral disorders: Secondary | ICD-10-CM | POA: Diagnosis not present

## 2023-09-06 DIAGNOSIS — Z7901 Long term (current) use of anticoagulants: Secondary | ICD-10-CM | POA: Diagnosis not present

## 2023-09-06 LAB — COMPREHENSIVE METABOLIC PANEL WITH GFR
ALT: 9 U/L (ref 0–44)
AST: 12 U/L — ABNORMAL LOW (ref 15–41)
Albumin: 3.2 g/dL — ABNORMAL LOW (ref 3.5–5.0)
Alkaline Phosphatase: 50 U/L (ref 38–126)
Anion gap: 12 (ref 5–15)
BUN: 13 mg/dL (ref 8–23)
CO2: 26 mmol/L (ref 22–32)
Calcium: 9.1 mg/dL (ref 8.9–10.3)
Chloride: 97 mmol/L — ABNORMAL LOW (ref 98–111)
Creatinine, Ser: 0.68 mg/dL (ref 0.44–1.00)
GFR, Estimated: >60 mL/min (ref >60)
Glucose, Bld: 112 mg/dL — ABNORMAL HIGH (ref 70–99)
Potassium: 3.5 mmol/L (ref 3.5–5.1)
Sodium: 136 mmol/L (ref 135–145)
Total Bilirubin: 1.3 mg/dL — ABNORMAL HIGH (ref 0.0–1.2)
Total Protein: 5.3 g/dL — ABNORMAL LOW (ref 6.5–8.1)

## 2023-09-06 LAB — CBC
HCT: 31.9 % — ABNORMAL LOW (ref 36.0–46.0)
Hemoglobin: 10.4 g/dL — ABNORMAL LOW (ref 12.0–15.0)
MCH: 33.9 pg (ref 26.0–34.0)
MCHC: 32.6 g/dL (ref 30.0–36.0)
MCV: 103.9 fL — ABNORMAL HIGH (ref 80.0–100.0)
Platelets: 153 K/uL (ref 150–400)
RBC: 3.07 MIL/uL — ABNORMAL LOW (ref 3.87–5.11)
RDW: 13.9 % (ref 11.5–15.5)
WBC: 9.1 K/uL (ref 4.0–10.5)
nRBC: 0 % (ref 0.0–0.2)

## 2023-09-06 LAB — PROCALCITONIN: Procalcitonin: 0.14 ng/mL

## 2023-09-06 LAB — VALPROIC ACID LEVEL: Valproic Acid Lvl: 80 ug/mL (ref 50–100)

## 2023-09-06 MED ORDER — SODIUM CHLORIDE 0.9 % IV SOLN
12.5000 mg | Freq: Four times a day (QID) | INTRAVENOUS | Status: DC | PRN
  Administered 2023-09-06 – 2023-09-11 (×3): 12.5 mg via INTRAVENOUS
  Filled 2023-09-06: qty 12.5
  Filled 2023-09-06: qty 0.5
  Filled 2023-09-06 (×2): qty 12.5

## 2023-09-06 MED ORDER — SODIUM CHLORIDE 0.9 % IV SOLN
2.0000 g | INTRAVENOUS | Status: DC
  Administered 2023-09-06 – 2023-09-07 (×2): 2 g via INTRAVENOUS
  Filled 2023-09-06 (×2): qty 20

## 2023-09-06 MED ORDER — SODIUM CHLORIDE 0.9 % IV SOLN: INTRAVENOUS | Status: DC

## 2023-09-06 MED ORDER — FLEET ENEMA RE ENEM
1.0000 | ENEMA | Freq: Once | RECTAL | Status: AC
  Administered 2023-09-06: 1 via RECTAL
  Filled 2023-09-06: qty 1

## 2023-09-06 MED ORDER — LIDOCAINE 5 % EX PTCH
1.0000 | MEDICATED_PATCH | CUTANEOUS | Status: AC
  Administered 2023-09-06: 1 via TRANSDERMAL
  Filled 2023-09-06: qty 1

## 2023-09-06 MED ORDER — TRAMADOL HCL 50 MG PO TABS
25.0000 mg | ORAL_TABLET | Freq: Once | ORAL | Status: AC
  Administered 2023-09-06: 25 mg via ORAL
  Filled 2023-09-06: qty 1

## 2023-09-06 NOTE — Progress Notes (Signed)
 Heart Failure Navigator Progress Note  Assessed for Heart & Vascular TOC clinic readiness.  Patient does not meet criteria due to last EF 70-75%, has a scheduled hospital follow up on 09/25/2023. No HF TOC. .   Navigator will sign off at this time.   Stephane Haddock, BSN, Scientist, clinical (histocompatibility and immunogenetics) Only

## 2023-09-06 NOTE — Evaluation (Signed)
 Occupational Therapy Evaluation Patient Details Name: Donna Sexton MRN: [id number] DOB: [birth year] Today's Date: 09/06/2023   History of Present Illness   73 yo female presents to therapy following hospital admission on 09/05/2023 from [facility] rehab facility secondary to hypotension and lethargy. Upon arrival to ED pt hypoxic with O2 saturation in the 80s. Respiratory panel negative, CT head negative for acute findings. Pt found to have acute respiratory failure with hypoxia. Pt PMH includes but is not limited to: COVID, anemia, anxiety, bioplar disorder, migraine, asthma, GERD, IBS, HLD, HTN, OP, OSA, UTI, PAF, dCHF and falls. Pt has participated with PT/OT in hospital February, April and July of this year.     Clinical Impressions Pt seen for OT/PT co-evaluation. Pt most recently at [facility], where she was walking short distances using RW and participating in therapy sessions, requiring assist for ADL performance. Pt endorses feeling unwell, and presents with generalized weakness, decreased tolerance to activity, impaired balance and lethargy. HR noted to vary between 80 bpm - 127 bpm throughout session. Pt requires TOTAL A +2 for bed mobility to transition from supine to sitting, progresses to CGA with BUE/BLE support for brief period statically sitting at EOB, but with poor tolerance and requests to return to supine. Pt found to be incontinent of large amounts of BM/urine with pure wick in place; requiring MAX A to roll L<>R and TOTAL A for pericare with full bed linen change provided (NT in room to assist).   Pt will require up to MIN A for grooming/self-feeding tasks (noted prior OT tx sessions indicating severe BUE tremors, not present on eval), and MAX-TOTAL for UB/LB ADLs. Pt would benefit from skilled OT services to address noted impairments and functional limitations (see below for any additional details) in order to maximize safety and independence while minimizing falls risk and caregiver  burden. Anticipate the need for follow up OT services upon acute hospital DC. Patient will benefit from continued inpatient follow up therapy, <3 hours/day.       If plan is discharge home, recommend the following:   Two people to help with walking and/or transfers;Two people to help with bathing/dressing/bathroom;Assistance with feeding;Direct supervision/assist for medications management;Direct supervision/assist for financial management;Assist for transportation;Help with stairs or ramp for entrance;Supervision due to cognitive status     Functional Status Assessment   Patient has had a recent decline in their functional status and demonstrates the ability to make significant improvements in function in a reasonable and predictable amount of time.     Equipment Recommendations   None recommended by OT      Precautions/Restrictions   Precautions Precautions: Fall Recall of Precautions/Restrictions: Impaired Restrictions Weight Bearing Restrictions Per Provider Order: No     Mobility Bed Mobility Overal bed mobility: Needs Assistance Bed Mobility: Supine to Sit, Sit to Supine, Rolling Rolling: Max assist, +2 for physical assistance   Supine to sit: Total assist, +2 for physical assistance, HOB elevated, Used rails Sit to supine: Total assist, +2 for physical assistance, HOB elevated, Used rails   General bed mobility comments: Pt with decreased movement initiation due to feeling poorly/lethargic. Ultimately required total +2 for transitioning towards sitting EOB, poor tolerance once upright. CGA for sitting balance.    Transfers Overall transfer level: Needs assistance                 General transfer comment: NT due to limited tolerance to upright, generalized weakness and lethargy      Balance Overall balance assessment: Needs  assistance Sitting-balance support: Feet supported, Bilateral upper extremity supported Sitting balance-Leahy Scale:  Poor Sitting balance - Comments: improves to CGA for static sitting                                   ADL either performed or assessed with clinical judgement   ADL Overall ADL's : Needs assistance/impaired Eating/Feeding: Bed level;Minimal assistance   Grooming: Bed level;Minimal assistance   Upper Body Bathing: Bed level;Maximal assistance   Lower Body Bathing: Total assistance;+2 for physical assistance;Bed level   Upper Body Dressing : Bed level;Maximal assistance   Lower Body Dressing: Bed level;Total assistance Lower Body Dressing Details (indicate cue type and reason): total A for maxA   Toilet Transfer Details (indicate cue type and reason): NT Toileting- Clothing Manipulation and Hygiene: Total assistance;+2 for physical assistance;Bed level Toileting - Clothing Manipulation Details (indicate cue type and reason): pt found to be incontinent of bowel/urine with pure wick in place. TOTAL A +2 for bed level pericare       General ADL Comments: Pt able to reach for bed rail, but otherwise TOTAL A for all mobility efforts, tolerated sitting EOB only briefly     Vision Baseline Vision/History: 1 Wears glasses Ability to See in Adequate Light: 0 Adequate Patient Visual Report: No change from baseline              Pertinent Vitals/Pain Pain Assessment Pain Assessment: Faces Faces Pain Scale: Hurts a little bit Pain Location: L hip, knees, generalized Pain Descriptors / Indicators: Grimacing Pain Intervention(s): Limited activity within patient's tolerance, Repositioned     Extremity/Trunk Assessment Upper Extremity Assessment Upper Extremity Assessment: Generalized weakness;Right hand dominant   Lower Extremity Assessment Lower Extremity Assessment: Defer to PT evaluation       Communication Communication Communication: Impaired Factors Affecting Communication: Reduced clarity of speech;Difficulty expressing self   Cognition Arousal:  Lethargic Behavior During Therapy: Flat affect Cognition: Difficult to assess Difficult to assess due to: Level of arousal                             Following commands: Intact       Cueing  General Comments   Cueing Techniques: Verbal cues;Gestural cues;Tactile cues  HR varied from 80-137 bpm throughout session. Pt on 2L via [place]. Pitting edema present on BLE.           Home Living Family/patient expects to be discharged to:: Skilled nursing facility                                 Additional Comments: uses RW mostly, sleeps in lift chair, ex husband has been helping her, has been to rehab multiple times this year      Prior Functioning/Environment Prior Level of Function : Needs assist             Mobility Comments: pt reports walking short distances with PT at [facility] ADLs Comments: requires assist from staff at SNF    OT Problem List: Decreased strength;Decreased range of motion;Decreased activity tolerance;Impaired balance (sitting and/or standing);Obesity;Cardiopulmonary status limiting activity;Decreased knowledge of precautions;Decreased knowledge of use of DME or AE;Increased edema   OT Treatment/Interventions: Self-care/ADL training;Therapeutic exercise;Neuromuscular education;DME and/or AE instruction;Patient/family education;Balance training;Therapeutic activities      OT Goals(Current goals can be found in the  care plan section)   Acute Rehab OT Goals OT Goal Formulation: With patient Time For Goal Achievement: 09/20/23 Potential to Achieve Goals: Fair   OT Frequency:  Min 2X/week    Co-evaluation PT/OT/SLP Co-Evaluation/Treatment: Yes Reason for Co-Treatment: Complexity of the patient's impairments (multi-system involvement);For patient/therapist safety;To address functional/ADL transfers PT goals addressed during session: Balance;Mobility/safety with mobility OT goals addressed during session: ADL's and self-care       AM-PAC OT 6 Clicks Daily Activity     Outcome Measure Help from another person eating meals?: A Lot Help from another person taking care of personal grooming?: A Lot Help from another person toileting, which includes using toliet, bedpan, or urinal?: Total Help from another person bathing (including washing, rinsing, drying)?: Total Help from another person to put on and taking off regular upper body clothing?: Total Help from another person to put on and taking off regular lower body clothing?: Total 6 Click Score: 8   End of Session Equipment Utilized During Treatment: Oxygen Nurse Communication: Mobility status;Need for lift equipment  Activity Tolerance: Patient limited by fatigue;Patient limited by lethargy Patient left: in bed;with call bell/phone within reach;with nursing/sitter in room;with bed alarm set;with family/visitor present  OT Visit Diagnosis: Muscle weakness (generalized) (M62.81);Other abnormalities of gait and mobility (R26.89);Unsteadiness on feet (R26.81)                Time: 8966-8890 OT Time Calculation (min): 36 min Charges:  OT General Charges $OT Visit: 1 Visit OT Evaluation $OT Eval Moderate Complexity: 1 Mod OT Treatments $Self Care/Home Management : 8-22 mins  Adolphe Fortunato L. [place] Hantz, OTR/L  09/06/23, 11:54 AM

## 2023-09-06 NOTE — Evaluation (Signed)
 Physical Therapy Evaluation Patient Details Name: Donna Sexton MRN: [id number] DOB: 09/21/1950 Today's Date: 09/06/2023  History of Present Illness  73 yo female presents to therapy following hospital admission on 09/05/2023 from [facility] rehab facility secondary to hypotension and lethargy. Upon arrival to ED pt hypoxic with O2 saturation in the 80s. Respiratory panel negative, CT head negative for acute findings. Pt found to have acute respiratory failure with hypoxia, volume overload attributed to [facility] and suspect UTI. Pt PMH includes but is not limited to: COVID, anemia, anxiety, bioplar disorder, migraine, asthma, GERD, IBS, HLD, HTN, OP, OSA, UTI, PAF, dCHF and falls. Pt has participated with PT in hospital February, April and July of this year.  Clinical Impression     Pt admitted with above diagnosis.  Pt currently with functional limitations due to the deficits listed below (see PT Problem List). Pt in bed when therapist arrived. Visitor present at beginning of therapy session and able to provide insight to pt mobility at SNF during rehab and then exited room. Pt required encouragement for participation. Pt reported not feeling well, pt had elevated temp today and is lethargic. Pt required max A x 2 for rolling side to side, pt able to reach for bed rails on R side, pt required total A for supine <> sit and repositioning in bed, pt required mod A for initial sitting balance with L lateral and posterior lean and able to progress to CGA with B UE and LE support. Pt required total A for hygiene tasks with OT, PT and nurse tech. Pt left in bed, all needs in place and nurse tech present. Patient will benefit from continued inpatient follow up therapy, <3 hours/day.  Pt will benefit from acute skilled PT to increase their independence and safety with mobility to allow discharge.         If plan is discharge home, recommend the following: Two people to help with walking and/or transfers;Two  people to help with bathing/dressing/bathroom;Assistance with cooking/housework;Assist for transportation;Help with stairs or ramp for entrance   Can travel by private vehicle   No    Equipment Recommendations None recommended by PT  Recommendations for Other Services       Functional Status Assessment Patient has had a recent decline in their functional status and demonstrates the ability to make significant improvements in function in a reasonable and predictable amount of time.     Precautions / Restrictions Precautions Precautions: Fall Recall of Precautions/Restrictions: Impaired Restrictions Weight Bearing Restrictions Per Provider Order: No      Mobility  Bed Mobility Overal bed mobility: Needs Assistance Bed Mobility: Supine to Sit, Sit to Supine, Rolling Rolling: Max assist, +2 for physical assistance   Supine to sit: Total assist, +2 for physical assistance, HOB elevated, Used rails Sit to supine: Total assist, +2 for physical assistance, HOB elevated, Used rails   General bed mobility comments: Pt with decreased movement initiation due to feeling poorly/lethargic. Ultimately required total +2 for transitioning towards sitting EOB, poor tolerance once upright. CGA for sitting balance.    Transfers                   General transfer comment: NT due to leghargy, limited initiation of movement to engage with bed mobility and extensive assist    Ambulation/Gait               General Gait Details: NT  Stairs            Wheelchair  Mobility     Tilt Bed    Modified Rankin (Stroke Patients Only)       Balance Overall balance assessment: Needs assistance, History of Falls Sitting-balance support: Feet supported, Bilateral upper extremity supported Sitting balance-Leahy Scale: Poor Sitting balance - Comments: pt required mod A and progressed to CGA                                     Pertinent Vitals/Pain Pain  Assessment Pain Assessment: Faces Faces Pain Scale: Hurts a little bit Pain Location: L hip, knees, generalized Pain Descriptors / Indicators: Grimacing Pain Intervention(s): Limited activity within patient's tolerance, Monitored during session, Repositioned    Home Living Family/patient expects to be discharged to:: Skilled nursing facility                   Additional Comments: uses RW mostly, sleeps in lift chair, ex husband has been helping her, has been to rehab multiple times this year    Prior Function Prior Level of Function : Needs assist             Mobility Comments: pt reports walking short distances with PT at [facility] ADLs Comments: requires assist from staff at [facility]     Extremity/Trunk Assessment   Upper Extremity Assessment Upper Extremity Assessment: Generalized weakness    Lower Extremity Assessment Lower Extremity Assessment: Generalized weakness (B LE pitting edema noted)    Cervical / Trunk Assessment Cervical / Trunk Assessment: Normal  Communication   Communication Communication: Impaired Factors Affecting Communication: Reduced clarity of speech    Cognition Arousal: Lethargic Behavior During Therapy: Flat affect   PT - Cognitive impairments: Difficult to assess, Attention, Initiation                         Following commands: Impaired Following commands impaired: Follows one step commands with increased time     Cueing Cueing Techniques: Verbal cues, Gestural cues, Tactile cues, Visual cues     General Comments General comments (skin integrity, edema, etc.): HR varied from 80s to 137,  pt on 2 L/min throughout eval    Exercises     Assessment/Plan    PT Assessment Patient needs continued PT services  PT Problem List Decreased strength;Decreased range of motion;Decreased activity tolerance;Decreased balance;Decreased mobility;Decreased coordination;Pain;Cardiopulmonary status limiting activity       PT Treatment  Interventions DME instruction;Gait training;Functional mobility training;Therapeutic activities;Therapeutic exercise;Balance training;Neuromuscular re-education;Patient/family education    PT Goals (Current goals can be found in the Care Plan section)  Acute Rehab PT Goals Patient Stated Goal: feel better PT Goal Formulation: With patient Time For Goal Achievement: 09/20/23 Potential to Achieve Goals: Fair    Frequency Min 2X/week     Co-evaluation PT/OT/SLP Co-Evaluation/Treatment: Yes Reason for Co-Treatment: Complexity of the patient's impairments (multi-system involvement);For patient/therapist safety;To address functional/ADL transfers PT goals addressed during session: Balance;Mobility/safety with mobility OT goals addressed during session: ADL's and self-care       AM-PAC PT 6 Clicks Mobility  Outcome Measure Help needed turning from your back to your side while in a flat bed without using bedrails?: A Lot Help needed moving from lying on your back to sitting on the side of a flat bed without using bedrails?: Total Help needed moving to and from a bed to a chair (including a wheelchair)?: Total Help needed standing up from a chair using  your arms (e.g., wheelchair or bedside chair)?: Total Help needed to walk in hospital room?: Total Help needed climbing 3-5 steps with a railing? : Total 6 Click Score: 7    End of Session   Activity Tolerance: Patient limited by fatigue;Patient limited by lethargy Patient left: in bed;with nursing/sitter in room;with call bell/phone within reach Nurse Communication: Mobility status;Need for lift equipment PT Visit Diagnosis: Other abnormalities of gait and mobility (R26.89);Unsteadiness on feet (R26.81);Muscle weakness (generalized) (M62.81);History of falling (Z91.81);Difficulty in walking, not elsewhere classified (R26.2)    Time: 8966-8890 PT Time Calculation (min) (ACUTE ONLY): 36 min   Charges:   PT Evaluation $PT Eval Low  Complexity: 1 Low   PT General Charges $$ ACUTE PT VISIT: 1 Visit         [place], PT Acute Rehab   [place] VEAR Drone 09/06/2023, 1:25 PM

## 2023-09-06 NOTE — Progress Notes (Signed)
 Pt c/o of left chest soreness and pain 9/10,with nausea. Zofran  and Tylenol  were administered. Pt continues to have symptoms;NP notified.another dose of Zofran  and Tramadol  given.ECG performed.Lidocaine  patch applied.

## 2023-09-06 NOTE — Progress Notes (Signed)
 PROGRESS NOTE    Donna Sexton  FMW:[id number] DOB: 07/29/1950 DOA: 09/05/2023 PCP: Alvan Dorothyann BIRCH, MD    Brief Narrative:  Patient is 73 year old female with history of chronic hypoxemic failure due to COVID-19 infection, anxiety, bipolar disorder, GERD, hyperlipidemia and hypertension, recurrent UTI, paroxysmal A-fib and chronic diastolic dysfunction who is currently at a skilled nursing rehab for the last 5 weeks sent to the emergency room with hypotension and lethargy.  When EMS arrived, she was found to be hypoxemic in the 80s.  Patient was complaining of orthopnea.  Leg swelling.  Initially no reporting of fever or signs of infection.  At the emergency room she was started on oxygen, given Lasix .  White cell count 9.8.  COVID, influenza and RSV negative.  BNP was 511.  Chest x-ray showed cardiomegaly with retrocardiac atelectasis.  CT head without any acute abnormality. After admission, overnight patient started having low-grade fever and profound lethargy.  Subjective: Patient seen and examined.  She is very lethargic but able to follow commands.  Profound generalized weakness.  Overnight temperature 101.  Ex-husband who is also healthcare power of attorney at the bedside. Multiple investigations done as below, essentially normal.  Cultures collected and started on Rocephin  for presumed UTI.  Assessment & Plan:   Acute respiratory failure with hypoxemia, fluid overload likely secondary to diastolic congestive heart failure: Keep on oxygen to keep saturation more than 90%.  Will keep on IV Lasix  today.  Patient has urinated more than 3 L with improvement of leg swelling. Patient is already on beta-blockers and can continue.  Recent echocardiogram with normal ejection fraction, has diastolic dysfunction. Input output monitoring.  Electrolyte monitoring.  Acute metabolic encephalopathy: Underlying history of bipolar disorder.  Patient is quite lethargic today. Blood gas ordered,  normal pH and normal CO2. Procalcitonin 0.14 Blood cultures drawn Urine analysis and cultures drawn Starting patient on Rocephin  for presumed UTI. Check Depakote  levels. CT head was normal. Holding Lyrica  and trazodone  today.  GERD: On Pepcid . Hyperlipidemia: On statin. Essential hypertension: As above on Lasix  and metoprolol . Paroxysmal A-fib: Currently sinus rhythm.  On amiodarone , Cardizem , metoprolol .  Therapeutic on Eliquis .      DVT prophylaxis: apixaban  (ELIQUIS ) tablet 5 mg Start: 09/05/23 1400 apixaban  (ELIQUIS ) tablet 5 mg   Code Status: Full code Family Communication: Ex-husband at the bedside. Disposition Plan: Status is: Observation The patient will require care spanning > 2 midnights and should be moved to inpatient because: IV diuresis, IV antibiotics     Consultants:  None  Procedures:  None  Antimicrobials:  Rocephin  8/27---     Objective: Vitals:   09/06/23 0328 09/06/23 0500 09/06/23 0521 09/06/23 0722  BP: 125/80  131/86   Pulse: 98  90   Resp: 20  20   Temp: 99.7 F (37.6 C)  (!) 101.4 F (38.6 C)   TempSrc: Oral  Oral   SpO2: 95%  95% 94%  Weight:  108.2 kg    Height:        Intake/Output Summary (Last 24 hours) at 09/06/2023 1157 Last data filed at 09/06/2023 1100 Gross per 24 hour  Intake 435 ml  Output 4100 ml  Net -3665 ml   Filed Weights   09/05/23 0644 09/06/23 0500  Weight: 112 kg 108.2 kg    Examination:  General exam: Appears calm.  Chronically sick looking.  Profoundly debilitated. Respiratory system: Clear to auscultation. Respiratory effort normal.  No added sounds. Cardiovascular system: S1 & S2 heard, RRR.  Chronically  edematous legs, lymphedema present.  2+ pedal edema present. Gastrointestinal system: Abdomen is nondistended, soft and nontender. No organomegaly or masses felt. Normal bowel sounds heard.  Obese and pendulous. Central nervous system: Alert and current stimulation.  Difficult to keep  awake. Pupils are bilateral equal and reactive.    Data Reviewed: I have personally reviewed following labs and imaging studies  CBC: Recent Labs  Lab 09/05/23 0653 09/06/23 0532  WBC 9.8 9.1  NEUTROABS 6.1  --   HGB 11.1* 10.4*  HCT 35.0* 31.9*  MCV 106.1* 103.9*  PLT 184 153   Basic Metabolic Panel: Recent Labs  Lab 09/05/23 0812 09/06/23 0532  NA 135 136  K 3.9 3.5  CL 99 97*  CO2 26 26  GLUCOSE 95 112*  BUN 12 13  CREATININE 0.64 0.68  CALCIUM  8.7* 9.1   GFR: Estimated Creatinine Clearance: 74.1 mL/min (by C-G formula based on SCr of 0.68 mg/dL). Liver Function Tests: Recent Labs  Lab 09/05/23 0812 09/06/23 0532  AST 15 12*  ALT 14 9  ALKPHOS 51 50  BILITOT 1.0 1.3*  PROT 5.9* 5.3*  ALBUMIN 3.2* 3.2*   No results for input(s): LIPASE, AMYLASE in the last 168 hours. Recent Labs  Lab 09/05/23 1100  AMMONIA 28   Coagulation Profile: No results for input(s): INR, PROTIME in the last 168 hours. Cardiac Enzymes: No results for input(s): CKTOTAL, CKMB, CKMBINDEX, TROPONINI in the last 168 hours. BNP (last 3 results) No results for input(s): PROBNP in the last 8760 hours. HbA1C: No results for input(s): HGBA1C in the last 72 hours. CBG: Recent Labs  Lab 09/05/23 0646  GLUCAP 97   Lipid Profile: No results for input(s): CHOL, HDL, LDLCALC, TRIG, CHOLHDL, LDLDIRECT in the last 72 hours. Thyroid  Function Tests: No results for input(s): TSH, T4TOTAL, FREET4, T3FREE, THYROIDAB in the last 72 hours. Anemia Panel: No results for input(s): VITAMINB12, FOLATE, FERRITIN, TIBC, IRON, RETICCTPCT in the last 72 hours. Sepsis Labs: Recent Labs  Lab 09/06/23 0817  PROCALCITON 0.14    Recent Results (from the past 240 hours)  Resp panel by RT-PCR (RSV, Flu A&B, Covid) Anterior Nasal Swab     Status: None   Collection Time: 09/05/23  6:50 AM   Specimen: Anterior Nasal Swab  Result Value Ref Range  Status   SARS Coronavirus 2 by RT PCR NEGATIVE NEGATIVE Final    Comment: (NOTE) SARS-CoV-2 target nucleic acids are NOT DETECTED.  The SARS-CoV-2 RNA is generally detectable in upper respiratory specimens during the acute phase of infection. The lowest concentration of SARS-CoV-2 viral copies this assay can detect is 138 copies/mL. A negative result does not preclude SARS-Cov-2 infection and should not be used as the sole basis for treatment or other patient management decisions. A negative result may occur with  improper specimen collection/handling, submission of specimen other than nasopharyngeal swab, presence of viral mutation(s) within the areas targeted by this assay, and inadequate number of viral copies(<138 copies/mL). A negative result must be combined with clinical observations, patient history, and epidemiological information. The expected result is Negative.  Fact Sheet for Patients:  BloggerCourse.com  Fact Sheet for Healthcare Providers:  SeriousBroker.it  This test is no t yet approved or cleared by the [place]  FDA and  has been authorized for detection and/or diagnosis of SARS-CoV-2 by FDA under an Emergency Use Authorization (EUA). This EUA will remain  in effect (meaning this test can be used) for the duration of the COVID-19 declaration under Section 564(b)(1)  of the Act, 21 U.S.C.section 360bbb-3(b)(1), unless the authorization is terminated  or revoked sooner.       Influenza A by PCR NEGATIVE NEGATIVE Final   Influenza B by PCR NEGATIVE NEGATIVE Final    Comment: (NOTE) The Xpert Xpress SARS-CoV-2/FLU/RSV plus assay is intended as an aid in the diagnosis of influenza from Nasopharyngeal swab specimens and should not be used as a sole basis for treatment. Nasal washings and aspirates are unacceptable for Xpert Xpress SARS-CoV-2/FLU/RSV testing.  Fact Sheet for  Patients: BloggerCourse.com  Fact Sheet for Healthcare Providers: SeriousBroker.it  This test is not yet approved or cleared by the [place]  FDA and has been authorized for detection and/or diagnosis of SARS-CoV-2 by FDA under an Emergency Use Authorization (EUA). This EUA will remain in effect (meaning this test can be used) for the duration of the COVID-19 declaration under Section 564(b)(1) of the Act, 21 U.S.C. section 360bbb-3(b)(1), unless the authorization is terminated or revoked.     Resp Syncytial Virus by PCR NEGATIVE NEGATIVE Final    Comment: (NOTE) Fact Sheet for Patients: BloggerCourse.com  Fact Sheet for Healthcare Providers: SeriousBroker.it  This test is not yet approved or cleared by the [place]  FDA and has been authorized for detection and/or diagnosis of SARS-CoV-2 by FDA under an Emergency Use Authorization (EUA). This EUA will remain in effect (meaning this test can be used) for the duration of the COVID-19 declaration under Section 564(b)(1) of the Act, 21 U.S.C. section 360bbb-3(b)(1), unless the authorization is terminated or revoked.  Performed at [facility], [address], [place], [place] [address]          Radiology Studies: CT Head Wo Contrast Result Date: 09/05/2023 EXAM: CT HEAD WITHOUT CONTRAST 09/05/2023 08:34:01 AM TECHNIQUE: CT of the head was performed without the administration of intravenous contrast. Automated exposure control, iterative reconstruction, and/or weight based adjustment of the mA/kV was utilized to reduce the radiation dose to as low as reasonably achievable. COMPARISON: CT of the head dated 07/23/2023. CLINICAL HISTORY: Mental status change, unknown cause. Lethargic. FINDINGS: BRAIN AND VENTRICLES: No acute hemorrhage. Gray-white differentiation is preserved. No hydrocephalus. No extra-axial  collection. No mass effect or midline shift. Age-related atrophy and mild periventricular white matter disease. ORBITS: The patient is status post bilateral lens replacement. SINUSES: No acute abnormality. SOFT TISSUES AND SKULL: No acute soft tissue abnormality. No skull fracture. There are calcifications within the carotid siphons. IMPRESSION: 1. No acute intracranial abnormality. 2. Age-related atrophy and mild periventricular white matter disease. Electronically signed by: Evalene Coho MD 09/05/2023 08:59 AM EDT RP Workstation: [id number]   DG Chest Portable 1 View Result Date: 09/05/2023 CLINICAL DATA:  Shortness of breath EXAM: PORTABLE CHEST 1 VIEW COMPARISON:  07/23/2023 FINDINGS: Cardiomegaly with retrocardiac atelectasis. Pulmonary vascular prominence without overt edema. The visualized skeletal structures are unremarkable. IMPRESSION: Cardiomegaly with retrocardiac atelectasis. Pulmonary vascular prominence without overt edema. Electronically Signed   By: Marolyn JONETTA Jaksch M.D.   On: 09/05/2023 07:30        Scheduled Meds:  amiodarone   100 mg Oral Daily   apixaban   5 mg Oral BID   atorvastatin   40 mg Oral Daily   diltiazem   120 mg Oral Daily   divalproex   1,500 mg Oral QHS   famotidine   20 mg Oral Daily   fluticasone  furoate-vilanterol  1 puff Inhalation Daily   furosemide   40 mg Intravenous BID   lidocaine   1 patch Transdermal Q24H   metoprolol  tartrate  25  mg Oral BID   mirabegron  ER  25 mg Oral QHS   polyethylene glycol  17 g Oral Daily   potassium chloride  SA  20 mEq Oral Daily   senna-docusate  2 tablet Oral QHS   Continuous Infusions:  cefTRIAXone  (ROCEPHIN )  IV 2 g (09/06/23 0946)     LOS: 0 days    Time spent: 55 minutes    Renato Applebaum, MD [facility]

## 2023-09-07 ENCOUNTER — Inpatient Hospital Stay (HOSPITAL_COMMUNITY)

## 2023-09-07 DIAGNOSIS — J9601 Acute respiratory failure with hypoxia: Secondary | ICD-10-CM | POA: Diagnosis not present

## 2023-09-07 LAB — CBC WITH DIFFERENTIAL/PLATELET
Abs Immature Granulocytes: 0.08 K/uL — ABNORMAL HIGH (ref 0.00–0.07)
Basophils Absolute: 0 K/uL (ref 0.0–0.1)
Basophils Relative: 0 %
Eosinophils Absolute: 0 K/uL (ref 0.0–0.5)
Eosinophils Relative: 0 %
HCT: 30.7 % — ABNORMAL LOW (ref 36.0–46.0)
Hemoglobin: 9.8 g/dL — ABNORMAL LOW (ref 12.0–15.0)
Immature Granulocytes: 1 %
Lymphocytes Relative: 11 %
Lymphs Abs: 0.9 K/uL (ref 0.7–4.0)
MCH: 33.2 pg (ref 26.0–34.0)
MCHC: 31.9 g/dL (ref 30.0–36.0)
MCV: 104.1 fL — ABNORMAL HIGH (ref 80.0–100.0)
Monocytes Absolute: 1.7 K/uL — ABNORMAL HIGH (ref 0.1–1.0)
Monocytes Relative: 22 %
Neutro Abs: 5.2 K/uL (ref 1.7–7.7)
Neutrophils Relative %: 66 %
Platelets: 146 K/uL — ABNORMAL LOW (ref 150–400)
RBC: 2.95 MIL/uL — ABNORMAL LOW (ref 3.87–5.11)
RDW: 13.5 % (ref 11.5–15.5)
WBC: 7.9 K/uL (ref 4.0–10.5)
nRBC: 0 % (ref 0.0–0.2)

## 2023-09-07 LAB — PHOSPHORUS: Phosphorus: 3.5 mg/dL (ref 2.5–4.6)

## 2023-09-07 LAB — COMPREHENSIVE METABOLIC PANEL WITH GFR
ALT: 7 U/L (ref 0–44)
AST: <10 U/L — ABNORMAL LOW (ref 15–41)
Albumin: 2.8 g/dL — ABNORMAL LOW (ref 3.5–5.0)
Alkaline Phosphatase: 45 U/L (ref 38–126)
Anion gap: 12 (ref 5–15)
BUN: 17 mg/dL (ref 8–23)
CO2: 26 mmol/L (ref 22–32)
Calcium: 8.6 mg/dL — ABNORMAL LOW (ref 8.9–10.3)
Chloride: 99 mmol/L (ref 98–111)
Creatinine, Ser: 0.68 mg/dL (ref 0.44–1.00)
GFR, Estimated: >60 mL/min (ref >60)
Glucose, Bld: 95 mg/dL (ref 70–99)
Potassium: 3.4 mmol/L — ABNORMAL LOW (ref 3.5–5.1)
Sodium: 137 mmol/L (ref 135–145)
Total Bilirubin: 0.7 mg/dL (ref 0.0–1.2)
Total Protein: 5 g/dL — ABNORMAL LOW (ref 6.5–8.1)

## 2023-09-07 LAB — VITAMIN B12: Vitamin B-12: 249 pg/mL (ref 180–914)

## 2023-09-07 LAB — MAGNESIUM: Magnesium: 1.9 mg/dL (ref 1.7–2.4)

## 2023-09-07 MED ORDER — IOHEXOL 300 MG/ML  SOLN
100.0000 mL | Freq: Once | INTRAMUSCULAR | Status: AC | PRN
  Administered 2023-09-07: 100 mL via INTRAVENOUS

## 2023-09-07 MED ORDER — FAMOTIDINE IN NACL 20-0.9 MG/50ML-% IV SOLN
20.0000 mg | Freq: Two times a day (BID) | INTRAVENOUS | Status: DC
  Administered 2023-09-07 – 2023-09-08 (×4): 20 mg via INTRAVENOUS
  Filled 2023-09-07 (×4): qty 50

## 2023-09-07 MED ORDER — PIPERACILLIN-TAZOBACTAM 3.375 G IVPB
3.3750 g | Freq: Three times a day (TID) | INTRAVENOUS | Status: DC
  Administered 2023-09-07 – 2023-09-11 (×12): 3.375 g via INTRAVENOUS
  Filled 2023-09-07 (×11): qty 50

## 2023-09-07 MED ORDER — IOHEXOL 9 MG/ML PO SOLN
500.0000 mL | ORAL | Status: AC
  Administered 2023-09-07: 500 mL via ORAL

## 2023-09-07 MED ORDER — SODIUM CHLORIDE 0.9 % IV SOLN: INTRAVENOUS | Status: DC

## 2023-09-07 NOTE — NC FL2 (Signed)
 [place]  MEDICAID FL2 LEVEL OF CARE FORM     IDENTIFICATION  Patient Name: Donna Sexton Birthdate: [birth year] Sex: female Admission Date (Current Location): 09/05/2023  [facility] and [place] Number:  Producer, television/film/video and Address:         Provider Number: [id number]  Attending Physician Name and Address:  Raenelle Coria, MD  Relative Name and Phone Number:  Fonda Pfeiffer [phone]    Current Level of Care: Hospital Recommended Level of Care: Skilled Nursing Facility Prior Approval Number:    Date Approved/Denied:   PASRR Number: [id number]  Discharge Plan: SNF    Current Diagnoses: Patient Active Problem List   Diagnosis Date Noted   Acute respiratory failure with hypoxia (HCC) 09/05/2023   Acute on chronic diastolic CHF (congestive heart failure) (HCC) 09/05/2023   Macrocytic anemia 09/05/2023   Generalized weakness 07/26/2023   Acute metabolic encephalopathy 07/26/2023   Left lower lobe pneumonia 07/21/2023   Paroxysmal atrial fibrillation (HCC) 07/21/2023   Class 3 obesity 07/21/2023   Fall 07/21/2023   Physical deconditioning 07/21/2023   Concern about memory 04/14/2023   Tremor 04/14/2023   Encounter for monitoring amiodarone  therapy 03/21/2023   Hypercoagulable state due to persistent atrial fibrillation (HCC) 03/21/2023   Constipation 03/10/2023   Prediabetes 03/09/2023   Pure hypercholesterolemia 03/09/2023   COVID-19 03/09/2023   Urinary tract infectious disease 03/09/2023   V-tach (HCC) 03/01/2023   Atrial fibrillation with rapid ventricular response (HCC) 03/01/2023   Senile purpura (HCC) 02/16/2023   Palpitations 02/04/2022   DDD (degenerative disc disease), cervical 02/05/2021   Chronic heart failure with preserved ejection fraction (HCC) 05/16/2020   Impaired glucose tolerance 10/10/2019   Morbid obesity (HCC) 10/09/2019   Tobacco non-user 02/24/2019   Acute pain of right shoulder 02/23/2018   GAD (generalized anxiety disorder)  12/20/2017   Primary osteoarthritis of both knees 01/23/2017   Localized, primary osteoarthritis of hand, left 01/23/2017   Lumbar radiculitis 09/07/2015   IBS (irritable bowel syndrome) 08/07/2015   Elevated LFTs 05/18/2015   Hyperglycemia 05/12/2015   Lumbago 11/05/2014   Basal cell papilloma 09/11/2014   Restless leg 04/16/2014   Muscle spasm 02/27/2014   Overactive bladder 04/09/2013   Abnormal mammogram 06/22/2012   Bipolar disorder (HCC) 09/29/2011   Hyperlipidemia 09/29/2011   Essential hypertension 09/29/2011   Telogen effluvium 08/16/2011   Calcium  blood increased 01/11/2011   Clinical depression 10/04/2010   Anal bleeding 10/04/2010   Cannot sleep 10/04/2010   GERD 07/28/2009   COMMON MIGRAINE 12/29/2008   OSTEOPENIA 10/30/2007    Orientation RESPIRATION BLADDER Height & Weight     Self, Time, Situation, Place  Normal Incontinent Weight: 105 kg Height:  5' 4 (162.6 cm)  BEHAVIORAL SYMPTOMS/MOOD NEUROLOGICAL BOWEL NUTRITION STATUS      Continent Diet (Heart Healthy)  AMBULATORY STATUS COMMUNICATION OF NEEDS Skin   Total Care Verbally Normal                       Personal Care Assistance Level of Assistance  Bathing, Feeding, Dressing Bathing Assistance: Limited assistance Feeding assistance: Limited assistance Dressing Assistance: Limited assistance     Functional Limitations Info  Sight, Speech Sight Info: Impaired (readers)   Speech Info: Adequate    SPECIAL CARE FACTORS FREQUENCY  PT (By licensed PT), OT (By licensed OT)     PT Frequency: 5x week OT Frequency: 5x week            Contractures Contractures Info: Not  present    Additional Factors Info  Code Status, Allergies Code Status Info: Full Allergies Info: Morphine           Current Medications (09/07/2023):  This is the current hospital active medication list Current Facility-Administered Medications  Medication Dose Route Frequency Provider Last Rate Last Admin   0.9 %   sodium chloride  infusion   Intravenous Continuous Ghimire, Kuber, MD 100 mL/hr at 09/07/23 0818 Rate Change at 09/07/23 0818   acetaminophen  (TYLENOL ) tablet 650 mg  650 mg Oral Q6H PRN Celinda Alm Lot, MD   650 mg at 09/05/23 2256   Or   acetaminophen  (TYLENOL ) suppository 650 mg  650 mg Rectal Q6H PRN Celinda Alm Lot, MD       amiodarone  (PACERONE ) tablet 100 mg  100 mg Oral Daily Celinda Alm Lot, MD   100 mg at 09/07/23 1029   apixaban  (ELIQUIS ) tablet 5 mg  5 mg Oral BID Celinda Alm Lot, MD   5 mg at 09/07/23 1029   atorvastatin  (LIPITOR) tablet 40 mg  40 mg Oral Daily Celinda Alm Lot, MD   40 mg at 09/07/23 1029   bisacodyl  (DULCOLAX) suppository 10 mg  10 mg Rectal Daily PRN Celinda Alm Lot, MD       diltiazem  (CARDIZEM  CD) 24 hr capsule 120 mg  120 mg Oral Daily Celinda Alm Lot, MD   120 mg at 09/07/23 1029   divalproex  (DEPAKOTE  ER) 24 hr tablet 1,500 mg  1,500 mg Oral QHS Celinda Alm Lot, MD   1,500 mg at 09/05/23 2032   famotidine  (PEPCID ) IVPB 20 mg premix  20 mg Intravenous Q12H Ghimire, Kuber, MD 100 mL/hr at 09/07/23 0942 20 mg at 09/07/23 0942   fluticasone  furoate-vilanterol (BREO ELLIPTA ) 100-25 MCG/ACT 1 puff  1 puff Inhalation Daily Celinda Alm Lot, MD   1 puff at 09/07/23 0901   levalbuterol  (XOPENEX ) nebulizer solution 0.63 mg  0.63 mg Nebulization Q6H PRN Celinda Alm Lot, MD       metoprolol  tartrate (LOPRESSOR ) tablet 25 mg  25 mg Oral BID Celinda Alm Lot, MD   25 mg at 09/07/23 1030   mirabegron  ER (MYRBETRIQ ) tablet 25 mg  25 mg Oral QHS Celinda Alm Lot, MD   25 mg at 09/05/23 2032   ondansetron  (ZOFRAN ) tablet 4 mg  4 mg Oral Q6H PRN Celinda Alm Lot, MD       Or   ondansetron  (ZOFRAN ) injection 4 mg  4 mg Intravenous Q6H PRN Celinda Alm Lot, MD   4 mg at 09/06/23 2034   piperacillin -tazobactam (ZOSYN ) IVPB 3.375 g  3.375 g Intravenous Q8H Ghimire, Kuber, MD       polyethylene glycol (MIRALAX  / GLYCOLAX ) packet 17 g   17 g Oral Daily Celinda Alm Lot, MD       potassium chloride  SA (KLOR-CON  M) CR tablet 20 mEq  20 mEq Oral Daily Celinda Alm Lot, MD   20 mEq at 09/07/23 1030   promethazine  (PHENERGAN ) 12.5 mg in sodium chloride  0.9 % 50 mL IVPB  12.5 mg Intravenous Q6H PRN Ghimire, Kuber, MD 150 mL/hr at 09/07/23 0938 12.5 mg at 09/07/23 9061   senna-docusate (Senokot-S) tablet 2 tablet  2 tablet Oral QHS Celinda Alm Lot, MD   2 tablet at 09/05/23 2032     Discharge Medications: Please see discharge summary for a list of discharge medications.  Relevant Imaging Results:  Relevant Lab Results:   Additional Information [id number]  Shonya Sumida, Nathanel, RN

## 2023-09-07 NOTE — TOC Initial Note (Signed)
 Transition of Care [facility]) - Initial/Assessment Note    Patient Details  Name: Donna Sexton MRN: [id number] Date of Birth: 10/23/1950  [facility] CM/SW Contact:    Bascom Service, RN Phone Number: 09/07/2023, 3:59 PM  Clinical Narrative: From [address] [facility] SNF rep [place] confirmed. Initiated [id number] auth.                Expected Discharge Plan: Skilled Nursing Facility Barriers to Discharge: Insurance Authorization   Patient Goals and CMS Choice Patient states their goals for this hospitalization and ongoing recovery are:: Return to [facility] SN[facility] CMS Medicare.gov Compare Post Acute Care list provided to:: Patient Choice offered to / list presented to : Patient [place] ownership interest in [facility].provided to:: Patient    Expected Discharge Plan and Services                                              Prior Living Arrangements/Services                       Activities of Daily Living   ADL Screening (condition at time of admission) Independently performs ADLs?: No Does the patient have a NEW difficulty with bathing/dressing/toileting/self-feeding that is expected to last >3 days?: Yes (Initiates electronic notice to provider for possible OT consult) Does the patient have a NEW difficulty with getting in/out of bed, walking, or climbing stairs that is expected to last >3 days?: Yes (Initiates electronic notice to provider for possible PT consult) Does the patient have a NEW difficulty with communication that is expected to last >3 days?: No Is the patient deaf or have difficulty hearing?: No Does the patient have difficulty seeing, even when wearing glasses/contacts?: No Does the patient have difficulty concentrating, remembering, or making decisions?: No  Permission Sought/Granted                  Emotional Assessment              Admission diagnosis:  SOB (shortness of breath) [R06.02] Hypoxia  [R09.02] Acute respiratory failure with hypoxia (HCC) [J96.01] Acute congestive heart failure, unspecified heart failure type (HCC) [I50.9] Acute metabolic encephalopathy [G93.41] Patient Active Problem List   Diagnosis Date Noted   Acute respiratory failure with hypoxia (HCC) 09/05/2023   Acute on chronic diastolic CHF (congestive heart failure) (HCC) 09/05/2023   Macrocytic anemia 09/05/2023   Generalized weakness 07/26/2023   Acute metabolic encephalopathy 07/26/2023   Left lower lobe pneumonia 07/21/2023   Paroxysmal atrial fibrillation (HCC) 07/21/2023   Class 3 obesity 07/21/2023   Fall 07/21/2023   Physical deconditioning 07/21/2023   Concern about memory 04/14/2023   Tremor 04/14/2023   Encounter for monitoring amiodarone  therapy 03/21/2023   Hypercoagulable state due to persistent atrial fibrillation (HCC) 03/21/2023   Constipation 03/10/2023   Prediabetes 03/09/2023   Pure hypercholesterolemia 03/09/2023   COVID-19 03/09/2023   Urinary tract infectious disease 03/09/2023   V-tach (HCC) 03/01/2023   Atrial fibrillation with rapid ventricular response (HCC) 03/01/2023   Senile purpura (HCC) 02/16/2023   Palpitations 02/04/2022   DDD (degenerative disc disease), cervical 02/05/2021   Chronic heart failure with preserved ejection fraction (HCC) 05/16/2020   Impaired glucose tolerance 10/10/2019   Morbid obesity (HCC) 10/09/2019   Tobacco non-user 02/24/2019   Acute pain of right shoulder 02/23/2018  GAD (generalized anxiety disorder) 12/20/2017   Primary osteoarthritis of both knees 01/23/2017   Localized, primary osteoarthritis of hand, left 01/23/2017   Lumbar radiculitis 09/07/2015   IBS (irritable bowel syndrome) 08/07/2015   Elevated LFTs 05/18/2015   Hyperglycemia 05/12/2015   Lumbago 11/05/2014   Basal cell papilloma 09/11/2014   Restless leg 04/16/2014   Muscle spasm 02/27/2014   Overactive bladder 04/09/2013   Abnormal mammogram 06/22/2012   Bipolar  disorder (HCC) 09/29/2011   Hyperlipidemia 09/29/2011   Essential hypertension 09/29/2011   Telogen effluvium 08/16/2011   Calcium  blood increased 01/11/2011   Clinical depression 10/04/2010   Anal bleeding 10/04/2010   Cannot sleep 10/04/2010   GERD 07/28/2009   COMMON MIGRAINE 12/29/2008   OSTEOPENIA 10/30/2007   PCP:  Alvan Dorothyann BIRCH, MD Pharmacy:   [facility] [place], [place] - [address] [address] [place] [address] Phone: [phone] Fax: [phone]  [facility] [address] [place] [address] Phone: [phone] Fax: [phone]     Social Drivers of Health (SDOH) Social History: SDOH Screenings   Food Insecurity: No Food Insecurity (09/05/2023)  Recent Concern: Food Insecurity - Food Insecurity Present (06/26/2023)  Housing: Low Risk  (09/05/2023)  Transportation Needs: No Transportation Needs (09/05/2023)  Utilities: Not At Risk (09/05/2023)  Alcohol Screen: Low Risk  (03/28/2022)  Depression (PHQ2-9): Medium Risk (06/26/2023)  Financial Resource Strain: Low Risk  (06/26/2023)  Physical Activity: Inactive (06/26/2023)  Social Connections: Socially Isolated (09/05/2023)  Stress: Stress Concern Present (06/26/2023)  Tobacco Use: Low Risk  (09/05/2023)   SDOH Interventions:     Readmission Risk Interventions     No data to display

## 2023-09-07 NOTE — Progress Notes (Signed)
 PROGRESS NOTE    Donna Sexton  FMW:[id number] DOB: [birth year] DOA: 09/05/2023 PCP: Alvan Dorothyann BIRCH, MD    Brief Narrative:  Patient is 73 year old female with history of chronic hypoxemic failure due to COVID-19 infection, anxiety, bipolar disorder, GERD, hyperlipidemia and hypertension, recurrent UTI, paroxysmal A-fib and chronic diastolic dysfunction who is currently at a skilled nursing rehab for the last 5 weeks sent to the emergency room with hypotension and lethargy.  When EMS arrived, she was found to be hypoxemic in the 80s.  Patient was complaining of orthopnea.  Leg swelling.  Initially no reporting of fever or signs of infection.  At the emergency room she was started on oxygen, given Lasix .  White cell count 9.8.  COVID, influenza and RSV negative.  BNP was 511.  Chest x-ray showed cardiomegaly with retrocardiac atelectasis.  CT head without any acute abnormality. After admission, overnight patient started having low-grade fever and profound lethargy.  Subjective: Patient seen and examined.  Still has significant nausea but slightly better than last night.  Now on IV fluids.  Looks more interactive today.  Husband at the bedside.  Assessment & Plan:   Acute respiratory failure with hypoxemia, fluid overload likely secondary to diastolic congestive heart failure: Keep on oxygen to keep saturation more than 90%.  Holding diuresis due to clinical dehydration.  Patient is already on beta-blockers and can continue.  Recent echocardiogram with normal ejection fraction, has diastolic dysfunction. Input output monitoring.  Electrolyte monitoring.  Acute metabolic encephalopathy: Underlying history of bipolar disorder.  Patient has remained quite lethargic.  Some clinical improvement today. Blood gas reviewed normal pH and normal CO2. Procalcitonin 0.14 Blood cultures drawn, negative so far. Urine analysis and cultures drawn, no growth so far. On Rocephin . Ammonia level normal.   Depakote  level normal.  CT head normal. CT scan of chest abdomen pelvis today to rule out any basal pneumonia, hydronephrosis or intra-abdominal complications.  GERD: On Pepcid .  Change to IV with significant epigastric discomfort.  Hyperlipidemia: On statin.  Essential hypertension: As above on Lasix  and metoprolol .  Paroxysmal A-fib: Currently sinus rhythm.  On amiodarone , Cardizem , metoprolol .  Therapeutic on Eliquis .      DVT prophylaxis: apixaban  (ELIQUIS ) tablet 5 mg Start: 09/05/23 1400 apixaban  (ELIQUIS ) tablet 5 mg   Code Status: Full code Family Communication: Ex-husband at the bedside.  Patient's son on the phone. Disposition Plan: Status is: Inpatient.  Severe systemic illnesses.  Antibiotics.     Consultants:  None  Procedures:  None  Antimicrobials:  Rocephin  8/27---     Objective: Vitals:   09/06/23 1947 09/07/23 0500 09/07/23 0554 09/07/23 0904  BP: 125/86  (!) 140/81   Pulse: 97     Resp: 18  14   Temp: 98.2 F (36.8 C)  98.3 F (36.8 C)   TempSrc:      SpO2: 98%  94% 95%  Weight:  105 kg    Height:        Intake/Output Summary (Last 24 hours) at 09/07/2023 1149 Last data filed at 09/07/2023 0415 Gross per 24 hour  Intake 619.33 ml  Output 300 ml  Net 319.33 ml   Filed Weights   09/05/23 0644 09/06/23 0500 09/07/23 0500  Weight: 112 kg 108.2 kg 105 kg    Examination:  General exam: Sick looking.  Able to communicate better today.  Gross generalized weakness. Respiratory system: Clear to auscultation. Respiratory effort normal.  No added sounds. Cardiovascular system: S1 & S2 heard, RRR.  Chronically  edematous legs, lymphedema present.  2+ pedal edema present. Gastrointestinal system: Abdomen is nondistended, soft and nontender. No organomegaly or masses felt. Normal bowel sounds heard.  Obese and pendulous. Central nervous system: Alert and awake.  Mostly oriented.   Gross generalized weakness.     Data Reviewed: I have  personally reviewed following labs and imaging studies  CBC: Recent Labs  Lab 09/05/23 0653 09/06/23 0532 09/07/23 0508  WBC 9.8 9.1 7.9  NEUTROABS 6.1  --  5.2  HGB 11.1* 10.4* 9.8*  HCT 35.0* 31.9* 30.7*  MCV 106.1* 103.9* 104.1*  PLT 184 153 146*   Basic Metabolic Panel: Recent Labs  Lab 09/05/23 0812 09/06/23 0532 09/07/23 0508  NA 135 136 137  K 3.9 3.5 3.4*  CL 99 97* 99  CO2 26 26 26   GLUCOSE 95 112* 95  BUN 12 13 17   CREATININE 0.64 0.68 0.68  CALCIUM  8.7* 9.1 8.6*  MG  --   --  1.9  PHOS  --   --  3.5   GFR: Estimated Creatinine Clearance: 72.9 mL/min (by C-G formula based on SCr of 0.68 mg/dL). Liver Function Tests: Recent Labs  Lab 09/05/23 0812 09/06/23 0532 09/07/23 0508  AST 15 12* <10*  ALT 14 9 7   ALKPHOS 51 50 45  BILITOT 1.0 1.3* 0.7  PROT 5.9* 5.3* 5.0*  ALBUMIN 3.2* 3.2* 2.8*   No results for input(s): LIPASE, AMYLASE in the last 168 hours. Recent Labs  Lab 09/05/23 1100  AMMONIA 28   Coagulation Profile: No results for input(s): INR, PROTIME in the last 168 hours. Cardiac Enzymes: No results for input(s): CKTOTAL, CKMB, CKMBINDEX, TROPONINI in the last 168 hours. BNP (last 3 results) No results for input(s): PROBNP in the last 8760 hours. HbA1C: No results for input(s): HGBA1C in the last 72 hours. CBG: Recent Labs  Lab 09/05/23 0646  GLUCAP 97   Lipid Profile: No results for input(s): CHOL, HDL, LDLCALC, TRIG, CHOLHDL, LDLDIRECT in the last 72 hours. Thyroid  Function Tests: No results for input(s): TSH, T4TOTAL, FREET4, T3FREE, THYROIDAB in the last 72 hours. Anemia Panel: Recent Labs    09/07/23 0508  VITAMINB12 249   Sepsis Labs: Recent Labs  Lab 09/06/23 0817  PROCALCITON 0.14    Recent Results (from the past 240 hours)  Resp panel by RT-PCR (RSV, Flu A&B, Covid) Anterior Nasal Swab     Status: None   Collection Time: 09/05/23  6:50 AM   Specimen: Anterior Nasal  Swab  Result Value Ref Range Status   SARS Coronavirus 2 by RT PCR NEGATIVE NEGATIVE Final    Comment: (NOTE) SARS-CoV-2 target nucleic acids are NOT DETECTED.  The SARS-CoV-2 RNA is generally detectable in upper respiratory specimens during the acute phase of infection. The lowest concentration of SARS-CoV-2 viral copies this assay can detect is 138 copies/mL. A negative result does not preclude SARS-Cov-2 infection and should not be used as the sole basis for treatment or other patient management decisions. A negative result may occur with  improper specimen collection/handling, submission of specimen other than nasopharyngeal swab, presence of viral mutation(s) within the areas targeted by this assay, and inadequate number of viral copies(<138 copies/mL). A negative result must be combined with clinical observations, patient history, and epidemiological information. The expected result is Negative.  Fact Sheet for Patients:  BloggerCourse.com  Fact Sheet for Healthcare Providers:  SeriousBroker.it  This test is no t yet approved or cleared by the [place]  FDA and  has been  authorized for detection and/or diagnosis of SARS-CoV-2 by FDA under an Emergency Use Authorization (EUA). This EUA will remain  in effect (meaning this test can be used) for the duration of the COVID-19 declaration under Section 564(b)(1) of the Act, 21 U.S.C.section 360bbb-3(b)(1), unless the authorization is terminated  or revoked sooner.       Influenza A by PCR NEGATIVE NEGATIVE Final   Influenza B by PCR NEGATIVE NEGATIVE Final    Comment: (NOTE) The Xpert Xpress SARS-CoV-2/FLU/RSV plus assay is intended as an aid in the diagnosis of influenza from Nasopharyngeal swab specimens and should not be used as a sole basis for treatment. Nasal washings and aspirates are unacceptable for Xpert Xpress SARS-CoV-2/FLU/RSV testing.  Fact Sheet for  Patients: BloggerCourse.com  Fact Sheet for Healthcare Providers: SeriousBroker.it  This test is not yet approved or cleared by the [place]  FDA and has been authorized for detection and/or diagnosis of SARS-CoV-2 by FDA under an Emergency Use Authorization (EUA). This EUA will remain in effect (meaning this test can be used) for the duration of the COVID-19 declaration under Section 564(b)(1) of the Act, 21 U.S.C. section 360bbb-3(b)(1), unless the authorization is terminated or revoked.     Resp Syncytial Virus by PCR NEGATIVE NEGATIVE Final    Comment: (NOTE) Fact Sheet for Patients: BloggerCourse.com  Fact Sheet for Healthcare Providers: SeriousBroker.it  This test is not yet approved or cleared by the [place]  FDA and has been authorized for detection and/or diagnosis of SARS-CoV-2 by FDA under an Emergency Use Authorization (EUA). This EUA will remain in effect (meaning this test can be used) for the duration of the COVID-19 declaration under Section 564(b)(1) of the Act, 21 U.S.C. section 360bbb-3(b)(1), unless the authorization is terminated or revoked.  Performed at [facility], [address] [place], [place] [address]          Radiology Studies: No results found.       Scheduled Meds:  amiodarone   100 mg Oral Daily   apixaban   5 mg Oral BID   atorvastatin   40 mg Oral Daily   diltiazem   120 mg Oral Daily   divalproex   1,500 mg Oral QHS   fluticasone  furoate-vilanterol  1 puff Inhalation Daily   metoprolol  tartrate  25 mg Oral BID   mirabegron  ER  25 mg Oral QHS   polyethylene glycol  17 g Oral Daily   potassium chloride  SA  20 mEq Oral Daily   senna-docusate  2 tablet Oral QHS   Continuous Infusions:  sodium chloride  100 mL/hr at 09/07/23 0818   cefTRIAXone  (ROCEPHIN )  IV 2 g (09/07/23 1134)   famotidine  (PEPCID ) IV  20 mg (09/07/23 0942)   promethazine  (PHENERGAN ) injection (IM or IVPB) 12.5 mg (09/07/23 0938)     LOS: 1 day    Time spent: 55 minutes    Renato Applebaum, MD [facility]

## 2023-09-08 DIAGNOSIS — J9601 Acute respiratory failure with hypoxia: Secondary | ICD-10-CM | POA: Diagnosis not present

## 2023-09-08 LAB — BLOOD GAS, ARTERIAL
Bicarbonate: 30.6 mmol/L — ABNORMAL HIGH (ref 20.0–28.0)
Drawn by: 20012
Drawn by: 20012
FIO2: 28 %
O2 Content: 2 L/min
O2 Saturation: 96.7 mmol/L — AB (ref 0.0–2.0)
Patient temperature: 37.6
Patient temperature: 96.7
pH, Arterial: 44 mmHg (ref 7.35–7.45)
pH, Arterial: 7.45 (ref 7.35–7.45)
pO2, Arterial: 82 mmHg — AB (ref 83–48)
pO2, Arterial: 82 mmol/L — ABNORMAL LOW (ref 83–28.0)

## 2023-09-08 NOTE — Progress Notes (Signed)
 Physical Therapy Treatment Patient Details Name: Donna Sexton MRN: [id number] DOB: Dec 01, 1950 Today's Date: 09/08/2023   History of Present Illness 73 yo female presents to therapy following hospital admission on 09/05/2023 from [facility] rehab facility secondary to hypotension and lethargy. Upon arrival to ED pt hypoxic with O2 saturation in the 80s. Respiratory panel negative, CT head negative for acute findings. Pt found to have acute respiratory failure with hypoxia, volume overload attributed to [facility] and suspect UTI. Pt PMH includes but is not limited to: COVID, anemia, anxiety, bioplar disorder, migraine, asthma, GERD, IBS, HLD, HTN, OP, OSA, UTI, PAF, dCHF and falls. Pt has participated with PT in hospital February, April and July of this year.    PT Comments  Pt with gradual progress.  She required increased encouragement/education for therapy and expressed that she is tired and just wants to rest.  Visitor expressing concern that pt needs to work with therapy. She did improve transfers to mod A x 2 (from total x 2) and stood briefly at EOB with assist of 2.  She was not able to take any steps.   Cont POC. Patient will benefit from continued inpatient follow up therapy, <3 hours/day at d/c.    If plan is discharge home, recommend the following: Two people to help with walking and/or transfers;Two people to help with bathing/dressing/bathroom;Assistance with cooking/housework;Assist for transportation;Help with stairs or ramp for entrance   Can travel by private vehicle     No  Equipment Recommendations  None recommended by PT    Recommendations for Other Services       Precautions / Restrictions Precautions Precautions: Fall     Mobility  Bed Mobility Overal bed mobility: Needs Assistance Bed Mobility: Supine to Sit, Sit to Supine     Supine to sit: Mod assist, +2 for physical assistance Sit to supine: Mod assist, +2 for physical assistance   General bed mobility  comments: Pt with decreased motivation and initation requiring increased time and encouragement (see comments).  Required assist for trunks and legs.    Transfers Overall transfer level: Needs assistance Equipment used: Rolling walker (2 wheels) Transfers: Sit to/from Stand Sit to Stand: Mod assist, +2 physical assistance, From elevated surface           General transfer comment: Briefly stood x 2 from elevated bed with encouragement and mod A x 2.  She was not able to take any steps and did not want to pivot to recliner.  Provided assist to shift hips to L toward [facility] prior to sitting    Ambulation/Gait                   Stairs             Wheelchair Mobility     Tilt Bed    Modified Rankin (Stroke Patients Only)       Balance Overall balance assessment: Needs assistance, History of Falls Sitting-balance support: Feet supported, No upper extremity supported Sitting balance-Leahy Scale: Fair Sitting balance - Comments: Sat EOB 8-10 mins with supervision   Standing balance support: Bilateral upper extremity supported Standing balance-Leahy Scale: Poor Standing balance comment: Stood briefly (3 sec and 10 sec) with RW with mod A; fatigued easily                            Communication    Cognition Arousal: Alert Behavior During Therapy: Flat affect   PT - Cognitive  impairments: Difficult to assess, Attention, Initiation                           Following commands impaired: Follows one step commands with increased time    Cueing    Exercises      General Comments General comments (skin integrity, edema, etc.): Pt initially stated no to therapy.  Asked her why and she would not state if tired, pain, dizzy, sick, etc.  Educated on importance of mobility and she did push covers back to work with therapy reluctantly.  At [facility], again checked with pt on why she didn't want to mobilize.  She stated no one is listening to me,  they've gave me multiple enemas even though I told them I already had one , they've cleaned me multiple times, I'm tired and want to be left alone.  Provided active listening and offered understanding of her being tired.  Explained therapy role in increasing/encouraging mobility to improve her overall health, but that sitting and standing briefly today was progress and will try chair transfer/walking another day.  Pt nodding in agreement.  After session, pt's visitor (belief ex-husband) expressed that pt has been saying this (always tired) frequently and wants her to increase mobilty.  Discussed she did make some progress today but we will continue encouraging/educating/pushing pt to increase activity as able.      Pertinent Vitals/Pain Pain Assessment Pain Assessment: No/denies pain    Home Living                          Prior Function            PT Goals (current goals can now be found in the care plan section) Progress towards PT goals: Progressing toward goals    Frequency    Min 2X/week      PT Plan      Co-evaluation              AM-PAC PT 6 Clicks Mobility   Outcome Measure  Help needed turning from your back to your side while in a flat bed without using bedrails?: A Lot Help needed moving from lying on your back to sitting on the side of a flat bed without using bedrails?: Total Help needed moving to and from a bed to a chair (including a wheelchair)?: Total Help needed standing up from a chair using your arms (e.g., wheelchair or bedside chair)?: Total Help needed to walk in hospital room?: Total Help needed climbing 3-5 steps with a railing? : Total 6 Click Score: 7    End of Session Equipment Utilized During Treatment: Gait belt Activity Tolerance: Patient limited by fatigue Patient left: in bed;with call bell/phone within reach;with family/visitor present;with bed alarm set Nurse Communication: Mobility status;Need for lift equipment PT  Visit Diagnosis: Other abnormalities of gait and mobility (R26.89);Unsteadiness on feet (R26.81);Muscle weakness (generalized) (M62.81);History of falling (Z91.81);Difficulty in walking, not elsewhere classified (R26.2)     Time: 8882-8853 PT Time Calculation (min) (ACUTE ONLY): 29 min  Charges:    $Therapeutic Activity: 23-37 mins PT General Charges $$ ACUTE PT VISIT: 1 Visit                     Benjiman, PT Acute Rehab [facility] [phone]    Benjiman VEAR Mulberry 09/08/2023, 2:34 PM

## 2023-09-08 NOTE — Plan of Care (Signed)

## 2023-09-08 NOTE — Progress Notes (Signed)
 PROGRESS NOTE    Donna Sexton  FMW:[id number] DOB: 18-Jul-1950 DOA: 09/05/2023 PCP: Alvan Dorothyann BIRCH, MD    Brief Narrative:  Patient is 73 year old female with history of chronic hypoxemic failure due to COVID-19 infection, anxiety, bipolar disorder, GERD, hyperlipidemia and hypertension, recurrent UTI, paroxysmal A-fib and chronic diastolic dysfunction who is currently at a skilled nursing rehab for the last 5 weeks sent to the emergency room with hypotension and lethargy.  When EMS arrived, she was found to be hypoxemic in the 80s.  Patient was complaining of orthopnea.  Leg swelling.  Initially no reporting of fever or signs of infection.  At the emergency room she was started on oxygen, given Lasix .  White cell count 9.8.  COVID, influenza and RSV negative.  BNP was 511.  Chest x-ray showed cardiomegaly with retrocardiac atelectasis.  CT head without any acute abnormality. After admission, overnight patient started having low-grade fever and profound lethargy.  Patient was found to have a UTI due to multiple organisms.  Subjective: Patient seen and examined.  More alert awake today.  Feels weak.  Husband at the bedside.  Son on the phone.  Patient is afebrile.  She is only eating applesauce.  Nausea is improved but she does not have an appetite.  Patient was complaining of some muscle pain on her left breast.  She has some palpable tenderness and also with pleuritic pain.  Assessment & Plan:   Acute respiratory failure with hypoxemia, fluid overload likely secondary to diastolic congestive heart failure: Keep on oxygen to keep saturation more than 90%.  Holding diuresis due to clinical dehydration.  Patient is already on beta-blockers and can continue.  Recent echocardiogram with normal ejection fraction, has diastolic dysfunction.Input output monitoring.  Electrolyte monitoring. Will continue to hold diuretics, will allow her to compensate.  Acute metabolic encephalopathy:  Underlying history of bipolar disorder.  Patient has remained quite lethargic.  Improving now. Blood gas reviewed normal pH and normal CO2. Procalcitonin 0.14 Blood cultures drawn, negative so far. Urine culture with Pseudomonas and Enterococcus faecalis.  Final cultures pending. On Zosyn  now. Ammonia level normal.  Depakote  level normal.  CT head normal. CT scan of chest abdomen pelvis was essentially normal.  Continue symptomatic management.   GERD: On Pepcid .  Change to IV with significant epigastric discomfort.  Hyperlipidemia: On statin.  Continued.  Essential hypertension: As above on metoprolol .  Paroxysmal A-fib: Currently sinus rhythm.  On amiodarone , Cardizem , metoprolol .  Therapeutic on Eliquis .  Disposition: SNF.  Peer to peer done with Electrical engineer, advised updated therapy notes.      DVT prophylaxis: apixaban  (ELIQUIS ) tablet 5 mg Start: 09/05/23 1400 apixaban  (ELIQUIS ) tablet 5 mg   Code Status: Full code Family Communication: Ex-husband at the bedside.  Patient's son on the phone. Disposition Plan: Status is: Inpatient.  Severe systemic illnesses.  Antibiotics.     Consultants:  None  Procedures:  None  Antimicrobials:  Rocephin  8/27--- 8/28 Zosyn  8/28---     Objective: Vitals:   09/07/23 2113 09/08/23 0500 09/08/23 0636 09/08/23 0753  BP: 128/81  130/86   Pulse: (!) 105  86   Resp: 19  20   Temp: 98.8 F (37.1 C)  99.1 F (37.3 C)   TempSrc: Oral  Oral   SpO2: 98%  98% 96%  Weight:  107 kg    Height:        Intake/Output Summary (Last 24 hours) at 09/08/2023 1228 Last data filed at 09/08/2023 9362 Gross per 24  hour  Intake 1550.65 ml  Output 400 ml  Net 1150.65 ml   Filed Weights   09/06/23 0500 09/07/23 0500 09/08/23 0500  Weight: 108.2 kg 105 kg 107 kg    Examination:  General exam: Fairly comfortable.  Chronically sick looking.  Alert awake and interactive today.  Gross generalized weakness. Respiratory system: Clear to  auscultation. Respiratory effort normal.  No added sounds. Cardiovascular system: S1 & S2 heard, RRR.  Chronically edematous legs, lymphedema present.  2+ pedal edema present. Gastrointestinal system: Abdomen is nondistended, soft and nontender. No organomegaly or masses felt. Normal bowel sounds heard.  Obese and pendulous. Central nervous system: Alert and awake.  Mostly oriented.   Gross generalized weakness.     Data Reviewed: I have personally reviewed following labs and imaging studies  CBC: Recent Labs  Lab 09/05/23 0653 09/06/23 0532 09/07/23 0508  WBC 9.8 9.1 7.9  NEUTROABS 6.1  --  5.2  HGB 11.1* 10.4* 9.8*  HCT 35.0* 31.9* 30.7*  MCV 106.1* 103.9* 104.1*  PLT 184 153 146*   Basic Metabolic Panel: Recent Labs  Lab 09/05/23 0812 09/06/23 0532 09/07/23 0508  NA 135 136 137  K 3.9 3.5 3.4*  CL 99 97* 99  CO2 26 26 26   GLUCOSE 95 112* 95  BUN 12 13 17   CREATININE 0.64 0.68 0.68  CALCIUM  8.7* 9.1 8.6*  MG  --   --  1.9  PHOS  --   --  3.5   GFR: Estimated Creatinine Clearance: 73.6 mL/min (by C-G formula based on SCr of 0.68 mg/dL). Liver Function Tests: Recent Labs  Lab 09/05/23 0812 09/06/23 0532 09/07/23 0508  AST 15 12* <10*  ALT 14 9 7   ALKPHOS 51 50 45  BILITOT 1.0 1.3* 0.7  PROT 5.9* 5.3* 5.0*  ALBUMIN 3.2* 3.2* 2.8*   No results for input(s): LIPASE, AMYLASE in the last 168 hours. Recent Labs  Lab 09/05/23 1100  AMMONIA 28   Coagulation Profile: No results for input(s): INR, PROTIME in the last 168 hours. Cardiac Enzymes: No results for input(s): CKTOTAL, CKMB, CKMBINDEX, TROPONINI in the last 168 hours. BNP (last 3 results) No results for input(s): PROBNP in the last 8760 hours. HbA1C: No results for input(s): HGBA1C in the last 72 hours. CBG: Recent Labs  Lab 09/05/23 0646  GLUCAP 97   Lipid Profile: No results for input(s): CHOL, HDL, LDLCALC, TRIG, CHOLHDL, LDLDIRECT in the last 72  hours. Thyroid  Function Tests: No results for input(s): TSH, T4TOTAL, FREET4, T3FREE, THYROIDAB in the last 72 hours. Anemia Panel: Recent Labs    09/07/23 0508  VITAMINB12 249   Sepsis Labs: Recent Labs  Lab 09/06/23 0817  PROCALCITON 0.14    Recent Results (from the past 240 hours)  Resp panel by RT-PCR (RSV, Flu A&B, Covid) Anterior Nasal Swab     Status: None   Collection Time: 09/05/23  6:50 AM   Specimen: Anterior Nasal Swab  Result Value Ref Range Status   SARS Coronavirus 2 by RT PCR NEGATIVE NEGATIVE Final    Comment: (NOTE) SARS-CoV-2 target nucleic acids are NOT DETECTED.  The SARS-CoV-2 RNA is generally detectable in upper respiratory specimens during the acute phase of infection. The lowest concentration of SARS-CoV-2 viral copies this assay can detect is 138 copies/mL. A negative result does not preclude SARS-Cov-2 infection and should not be used as the sole basis for treatment or other patient management decisions. A negative result may occur with  improper specimen collection/handling, submission  of specimen other than nasopharyngeal swab, presence of viral mutation(s) within the areas targeted by this assay, and inadequate number of viral copies(<138 copies/mL). A negative result must be combined with clinical observations, patient history, and epidemiological information. The expected result is Negative.  Fact Sheet for Patients:  BloggerCourse.com  Fact Sheet for Healthcare Providers:  SeriousBroker.it  This test is no t yet approved or cleared by the [place]  FDA and  has been authorized for detection and/or diagnosis of SARS-CoV-2 by FDA under an Emergency Use Authorization (EUA). This EUA will remain  in effect (meaning this test can be used) for the duration of the COVID-19 declaration under Section 564(b)(1) of the Act, 21 U.S.C.section 360bbb-3(b)(1), unless the authorization is  terminated  or revoked sooner.       Influenza A by PCR NEGATIVE NEGATIVE Final   Influenza B by PCR NEGATIVE NEGATIVE Final    Comment: (NOTE) The Xpert Xpress SARS-CoV-2/FLU/RSV plus assay is intended as an aid in the diagnosis of influenza from Nasopharyngeal swab specimens and should not be used as a sole basis for treatment. Nasal washings and aspirates are unacceptable for Xpert Xpress SARS-CoV-2/FLU/RSV testing.  Fact Sheet for Patients: BloggerCourse.com  Fact Sheet for Healthcare Providers: SeriousBroker.it  This test is not yet approved or cleared by the [place]  FDA and has been authorized for detection and/or diagnosis of SARS-CoV-2 by FDA under an Emergency Use Authorization (EUA). This EUA will remain in effect (meaning this test can be used) for the duration of the COVID-19 declaration under Section 564(b)(1) of the Act, 21 U.S.C. section 360bbb-3(b)(1), unless the authorization is terminated or revoked.     Resp Syncytial Virus by PCR NEGATIVE NEGATIVE Final    Comment: (NOTE) Fact Sheet for Patients: BloggerCourse.com  Fact Sheet for Healthcare Providers: SeriousBroker.it  This test is not yet approved or cleared by the [place]  FDA and has been authorized for detection and/or diagnosis of SARS-CoV-2 by FDA under an Emergency Use Authorization (EUA). This EUA will remain in effect (meaning this test can be used) for the duration of the COVID-19 declaration under Section 564(b)(1) of the Act, 21 U.S.C. section 360bbb-3(b)(1), unless the authorization is terminated or revoked.  Performed at [facility], [address], [place], [place] [address]   Culture, blood (Routine X 2) w Reflex to ID Panel     Status: None (Preliminary result)   Collection Time: 09/06/23  8:17 AM   Specimen: BLOOD  Result Value Ref Range Status    Specimen Description   Final    BLOOD BLOOD RIGHT ARM Performed at [facility], [address], [place], [place] [address]    Special Requests   Final    BOTTLES DRAWN AEROBIC AND ANAEROBIC Blood Culture adequate volume Performed at [facility], [address], [place], [place] [address]    Culture   Final    NO GROWTH 2 DAYS Performed at [facility], [address] [place], [place] [address]    Report Status PENDING  Incomplete  Culture, blood (Routine X 2) w Reflex to ID Panel     Status: None (Preliminary result)   Collection Time: 09/06/23  8:17 AM   Specimen: BLOOD  Result Value Ref Range Status   Specimen Description   Final    BLOOD BLOOD RIGHT HAND Performed at [facility], [address], [place], [place] [address]    Special Requests   Final  BOTTLES DRAWN AEROBIC AND ANAEROBIC Blood Culture adequate volume Performed at [facility], [address] [place], [place] [address]    Culture   Final    NO GROWTH 2 DAYS Performed at [facility], [address] [place], [place] [address]    Report Status PENDING  Incomplete  Urine Culture (for pregnant, neutropenic or urologic patients or patients with an indwelling urinary catheter)     Status: Abnormal (Preliminary result)   Collection Time: 09/06/23 12:20 PM   Specimen: Urine, Clean Catch  Result Value Ref Range Status   Specimen Description   Final    URINE, CLEAN CATCH Performed at [facility], [address] [place] [address], [place], [place] [address]    Special Requests   Final    NONE Performed at [facility], [address] [place], [place] [address]    Culture (A)  Final    >=100,000 COLONIES/mL PSEUDOMONAS AERUGINOSA >=100,000 COLONIES/mL ENTEROCOCCUS FAECALIS SUSCEPTIBILITIES TO FOLLOW Performed at [facility], [address] [place], [place] [address]    Report Status PENDING  Incomplete          Radiology Studies: CT CHEST ABDOMEN PELVIS W CONTRAST Result Date: 09/07/2023 CLINICAL DATA:  Diffuse abdominal pain.  Sepsis.  Fever EXAM: CT CHEST, ABDOMEN, AND PELVIS WITH CONTRAST TECHNIQUE: Multidetector CT imaging of the chest, abdomen and pelvis was performed following the standard protocol during bolus administration of intravenous contrast. RADIATION DOSE REDUCTION: This exam was performed according to the departmental dose-optimization program which includes automated exposure control, adjustment of the mA and/or kV according to patient size and/or use of iterative reconstruction technique. CONTRAST:  OMNIPAQUE  IOHEXOL  300 MG/ML  SOLN COMPARISON:  None FINDINGS: CT CHEST FINDINGS Cardiovascular: No significant vascular findings. Normal heart size. No pericardial effusion. Mediastinum/Nodes: No axillary or supraclavicular adenopathy. No mediastinal or hilar adenopathy. No pericardial fluid. Esophagus normal. Lungs/Pleura: Moderate LEFT effusion. There is passive atelectasis of the LEFT lower lobe posterior lingula. No evidence of pneumonia. Small RIGHT effusion. Musculoskeletal: No aggressive osseous lesion. CT ABDOMEN AND PELVIS FINDINGS Hepatobiliary: Hypodensity in the central liver simple fluid attenuation most consistent benign cysts. Postcholecystectomy. No biliary duct dilatation. Pancreas: Pancreas is normal. No ductal dilatation. No pancreatic inflammation. Spleen: Normal spleen Adrenals/urinary tract: Adrenal glands and kidneys are normal. The ureters and bladder normal. Stomach/Bowel: Stomach, small bowel, appendix, and cecum are normal. The colon and rectosigmoid colon are normal. Vascular/Lymphatic: Abdominal aorta is normal caliber with atherosclerotic calcification. There is no retroperitoneal or periportal lymphadenopathy. No pelvic lymphadenopathy. Reproductive: Post hysterectomy.  Adnexa unremarkable Other: No free fluid or abscess. Musculoskeletal: No aggressive  osseous lesion. IMPRESSION: CHEST: 1. Moderate LEFT effusion and small RIGHT effusion. 2. Passive atelectasis of the LEFT lower lobe and lingula. 3. No evidence of pneumonia. PELVIS: 1. No acute findings in the abdomen pelvis. 2. No evidence of infection. 3.  Aortic Atherosclerosis (ICD10-I70.0). Electronically Signed   By: Jackquline Boxer M.D.   On: 09/07/2023 14:59         Scheduled Meds:  amiodarone   100 mg Oral Daily   apixaban   5 mg Oral BID   atorvastatin   40 mg Oral Daily   diltiazem   120 mg Oral Daily   divalproex   1,500 mg Oral QHS   fluticasone  furoate-vilanterol  1 puff Inhalation Daily   metoprolol  tartrate  25 mg Oral BID   mirabegron  ER  25 mg Oral QHS   polyethylene glycol  17 g  Oral Daily   potassium chloride  SA  20 mEq Oral Daily   senna-docusate  2 tablet Oral QHS   Continuous Infusions:  famotidine  (PEPCID ) IV 20 mg (09/08/23 0929)   piperacillin -tazobactam (ZOSYN )  IV 12.5 mL/hr at 09/08/23 9362   promethazine  (PHENERGAN ) injection (IM or IVPB) Stopped (09/07/23 0959)     LOS: 2 days    Time spent: 55 minutes    Renato Applebaum, MD [facility]

## 2023-09-08 NOTE — TOC Progression Note (Addendum)
 Transition of Care [facility]) - Progression Note    Patient Details  Name: Donna Sexton MRN: [id number] Date of Birth: Aug 29, 1950  [facility] CM/SW Contact  Nahom Carfagno, Nathanel, RN Phone Number: 09/08/2023, 9:50 AM  Clinical Narrative:  Awaiting auth;[facility] does not have bed available;unsure of when;therefore faxed out to additional facilities await bed offers. Joshua(son) aware. -10:11a Dr. Rebbecca offered [facility]#[phone], option 5,due by today @ 2:30p, Kathrynn Brokaw,3/17,1952,mbr#[phone]. The facility will change(its not [facility]) they dont have a bed;patient has been faxed out again-Joshua(son) is aware. -10:53a-P2P done-per MD need PT/OT notes by 5p. Await notes to send to insurance. -3p-PT note sent via [employer] portal, & also faxed to [phone] w/confirmation-await auth. -4:30p-TC [employer] to confirm they have received PT updated note-they did;awaiting auth.    Expected Discharge Plan: Skilled Nursing Facility Barriers to Discharge: Insurance Authorization               Expected Discharge Plan and Services                                               Social Drivers of Health (SDOH) Interventions SDOH Screenings   Food Insecurity: No Food Insecurity (09/05/2023)  Recent Concern: Food Insecurity - Food Insecurity Present (06/26/2023)  Housing: Low Risk  (09/05/2023)  Transportation Needs: No Transportation Needs (09/05/2023)  Utilities: Not At Risk (09/05/2023)  Alcohol Screen: Low Risk  (03/28/2022)  Depression (PHQ2-9): Medium Risk (06/26/2023)  Financial Resource Strain: Low Risk  (06/26/2023)  Physical Activity: Inactive (06/26/2023)  Social Connections: Socially Isolated (09/05/2023)  Stress: Stress Concern Present (06/26/2023)  Tobacco Use: Low Risk  (09/05/2023)    Readmission Risk Interventions     No data to display

## 2023-09-09 DIAGNOSIS — J9601 Acute respiratory failure with hypoxia: Secondary | ICD-10-CM | POA: Diagnosis not present

## 2023-09-09 LAB — URINE CULTURE: Culture: >=100000 — AB

## 2023-09-09 MED ORDER — FUROSEMIDE 40 MG PO TABS
40.0000 mg | ORAL_TABLET | Freq: Every day | ORAL | Status: DC
Start: 2023-09-09 — End: 2023-09-12
  Administered 2023-09-09 – 2023-09-12 (×4): 40 mg via ORAL
  Filled 2023-09-09: qty 1
  Filled 2023-09-09: qty 2
  Filled 2023-09-09: qty 1
  Filled 2023-09-09: qty 2

## 2023-09-09 MED ORDER — FAMOTIDINE 20 MG PO TABS
20.0000 mg | ORAL_TABLET | Freq: Two times a day (BID) | ORAL | Status: DC
  Administered 2023-09-09 – 2023-09-12 (×7): 20 mg via ORAL
  Filled 2023-09-09 (×7): qty 1

## 2023-09-09 MED ORDER — HYDROCODONE-ACETAMINOPHEN 5-325 MG PO TABS
1.0000 | ORAL_TABLET | Freq: Four times a day (QID) | ORAL | Status: DC | PRN
  Administered 2023-09-09 – 2023-09-12 (×6): 1 via ORAL
  Filled 2023-09-09 (×6): qty 1

## 2023-09-09 NOTE — Progress Notes (Signed)
 PROGRESS NOTE    Donna Sexton  FMW:[id number] DOB: 11/07/1950 DOA: 09/05/2023 PCP: Alvan Dorothyann BIRCH, MD    Brief Narrative:  Patient is 73 year old female with history of chronic hypoxemic failure due to COVID-19 infection, anxiety, bipolar disorder, GERD, hyperlipidemia and hypertension, recurrent UTI, paroxysmal A-fib and chronic diastolic dysfunction who is currently at a skilled nursing rehab for the last 5 weeks sent to the emergency room with hypotension and lethargy.  When EMS arrived, she was found to be hypoxemic in the 80s.  Patient was complaining of orthopnea.  Leg swelling.  Initially no reporting of fever or signs of infection.  At the emergency room she was started on oxygen, given Lasix .  White cell count 9.8.  COVID, influenza and RSV negative.  BNP was 511.  Chest x-ray showed cardiomegaly with retrocardiac atelectasis.  CT head without any acute abnormality. After admission, overnight patient started having low-grade fever and profound lethargy.  Patient was found to have a UTI due to multiple organisms.  Subjective: Patient seen and examined.  More interactive and alert today.  Denies any epigastric pain or chest pain. She has not have a good appetite yet.   Assessment & Plan:   Acute respiratory failure with hypoxemia, fluid overload likely secondary to diastolic congestive heart failure: Keep on oxygen to keep saturation more than 90%.  Diuretics were held secondary to clinical dehydration and acute infection. Stabilizing now.  Will start Lasix  40 mg daily today.  Continue beta-blockers. Recent echocardiogram with normal ejection fraction, has diastolic dysfunction.Input output monitoring.   Start mobilizing. Gradual increase of diuretics.  Acute UTI, present on admission causing acute metabolic encephalopathy. Blood cultures drawn, negative so far. Urine culture with Pseudomonas and Enterococcus faecalis.  Clinically improving on Zosyn .  Will continue Zosyn  to  complete 5 days of IV antibiotic therapy while she is in the hospital.  Given patient's recurrent UTI, she will benefit with long-term suppressive therapy.  Will discharge patient on long-term Macrobid  therapy. CT scan of chest abdomen pelvis was essentially normal.  Continue symptomatic managemen  Acute metabolic encephalopathy: Underlying history of bipolar disorder.  Patient has remained quite lethargic.  Improving now. Blood gas reviewed normal pH and normal CO2. Ammonia level normal.  Depakote  level normal.  CT head normal.  GERD: On Pepcid .  Oral Pepcid  twice daily.  Hyperlipidemia: On statin.  Continued.  Essential hypertension: As above on metoprolol  and Cardizem .  Stable.  Paroxysmal A-fib: Currently sinus rhythm.  On amiodarone , Cardizem , metoprolol .  Therapeutic on Eliquis .  Disposition: SNF when bed available.      DVT prophylaxis: apixaban  (ELIQUIS ) tablet 5 mg Start: 09/05/23 1400 apixaban  (ELIQUIS ) tablet 5 mg   Code Status: Full code Family Communication: None today. Disposition Plan: Status is: Inpatient.  Severe systemic illnesses.  Antibiotics.     Consultants:  None  Procedures:  None  Antimicrobials:  Rocephin  8/27--- 8/28 Zosyn  8/28---     Objective: Vitals:   09/08/23 2030 09/09/23 0419 09/09/23 0500 09/09/23 0733  BP: 112/79 114/79    Pulse:  68    Resp: 17 18    Temp: 98.8 F (37.1 C) 98 F (36.7 C)    TempSrc: Oral Oral    SpO2: 98% 98%  97%  Weight:   107.3 kg   Height:        Intake/Output Summary (Last 24 hours) at 09/09/2023 1120 Last data filed at 09/09/2023 1100 Gross per 24 hour  Intake 493.57 ml  Output 350 ml  Net  143.57 ml   Filed Weights   09/07/23 0500 09/08/23 0500 09/09/23 0500  Weight: 105 kg 107 kg 107.3 kg    Examination:  General exam: Looks comfortable.  She is chronically sick and very frail.  Today she is interactive and pleasant. Respiratory system: Clear to auscultation. Respiratory effort normal.   No added sounds. Cardiovascular system: S1 & S2 heard, RRR.  Chronically edematous legs, lymphedema present.  1+ pedal edema present. Gastrointestinal system: Abdomen is nondistended, soft and nontender. No organomegaly or masses felt. Normal bowel sounds heard.  Obese and pendulous. Central nervous system: Alert awake and oriented.  Gross generalized weakness..     Data Reviewed: I have personally reviewed following labs and imaging studies  CBC: Recent Labs  Lab 09/05/23 0653 09/06/23 0532 09/07/23 0508  WBC 9.8 9.1 7.9  NEUTROABS 6.1  --  5.2  HGB 11.1* 10.4* 9.8*  HCT 35.0* 31.9* 30.7*  MCV 106.1* 103.9* 104.1*  PLT 184 153 146*   Basic Metabolic Panel: Recent Labs  Lab 09/05/23 0812 09/06/23 0532 09/07/23 0508  NA 135 136 137  K 3.9 3.5 3.4*  CL 99 97* 99  CO2 26 26 26   GLUCOSE 95 112* 95  BUN 12 13 17   CREATININE 0.64 0.68 0.68  CALCIUM  8.7* 9.1 8.6*  MG  --   --  1.9  PHOS  --   --  3.5   GFR: Estimated Creatinine Clearance: 73.7 mL/min (by C-G formula based on SCr of 0.68 mg/dL). Liver Function Tests: Recent Labs  Lab 09/05/23 0812 09/06/23 0532 09/07/23 0508  AST 15 12* <10*  ALT 14 9 7   ALKPHOS 51 50 45  BILITOT 1.0 1.3* 0.7  PROT 5.9* 5.3* 5.0*  ALBUMIN 3.2* 3.2* 2.8*   No results for input(s): LIPASE, AMYLASE in the last 168 hours. Recent Labs  Lab 09/05/23 1100  AMMONIA 28   Coagulation Profile: No results for input(s): INR, PROTIME in the last 168 hours. Cardiac Enzymes: No results for input(s): CKTOTAL, CKMB, CKMBINDEX, TROPONINI in the last 168 hours. BNP (last 3 results) No results for input(s): PROBNP in the last 8760 hours. HbA1C: No results for input(s): HGBA1C in the last 72 hours. CBG: Recent Labs  Lab 09/05/23 0646  GLUCAP 97   Lipid Profile: No results for input(s): CHOL, HDL, LDLCALC, TRIG, CHOLHDL, LDLDIRECT in the last 72 hours. Thyroid  Function Tests: No results for input(s): TSH,  T4TOTAL, FREET4, T3FREE, THYROIDAB in the last 72 hours. Anemia Panel: Recent Labs    09/07/23 0508  VITAMINB12 249   Sepsis Labs: Recent Labs  Lab 09/06/23 0817  PROCALCITON 0.14    Recent Results (from the past 240 hours)  Resp panel by RT-PCR (RSV, Flu A&B, Covid) Anterior Nasal Swab     Status: None   Collection Time: 09/05/23  6:50 AM   Specimen: Anterior Nasal Swab  Result Value Ref Range Status   SARS Coronavirus 2 by RT PCR NEGATIVE NEGATIVE Final    Comment: (NOTE) SARS-CoV-2 target nucleic acids are NOT DETECTED.  The SARS-CoV-2 RNA is generally detectable in upper respiratory specimens during the acute phase of infection. The lowest concentration of SARS-CoV-2 viral copies this assay can detect is 138 copies/mL. A negative result does not preclude SARS-Cov-2 infection and should not be used as the sole basis for treatment or other patient management decisions. A negative result may occur with  improper specimen collection/handling, submission of specimen other than nasopharyngeal swab, presence of viral mutation(s) within the areas  targeted by this assay, and inadequate number of viral copies(<138 copies/mL). A negative result must be combined with clinical observations, patient history, and epidemiological information. The expected result is Negative.  Fact Sheet for Patients:  BloggerCourse.com  Fact Sheet for Healthcare Providers:  SeriousBroker.it  This test is no t yet approved or cleared by the [place]  FDA and  has been authorized for detection and/or diagnosis of SARS-CoV-2 by FDA under an Emergency Use Authorization (EUA). This EUA will remain  in effect (meaning this test can be used) for the duration of the COVID-19 declaration under Section 564(b)(1) of the Act, 21 U.S.C.section 360bbb-3(b)(1), unless the authorization is terminated  or revoked sooner.       Influenza A by PCR  NEGATIVE NEGATIVE Final   Influenza B by PCR NEGATIVE NEGATIVE Final    Comment: (NOTE) The Xpert Xpress SARS-CoV-2/FLU/RSV plus assay is intended as an aid in the diagnosis of influenza from Nasopharyngeal swab specimens and should not be used as a sole basis for treatment. Nasal washings and aspirates are unacceptable for Xpert Xpress SARS-CoV-2/FLU/RSV testing.  Fact Sheet for Patients: BloggerCourse.com  Fact Sheet for Healthcare Providers: SeriousBroker.it  This test is not yet approved or cleared by the [place]  FDA and has been authorized for detection and/or diagnosis of SARS-CoV-2 by FDA under an Emergency Use Authorization (EUA). This EUA will remain in effect (meaning this test can be used) for the duration of the COVID-19 declaration under Section 564(b)(1) of the Act, 21 U.S.C. section 360bbb-3(b)(1), unless the authorization is terminated or revoked.     Resp Syncytial Virus by PCR NEGATIVE NEGATIVE Final    Comment: (NOTE) Fact Sheet for Patients: BloggerCourse.com  Fact Sheet for Healthcare Providers: SeriousBroker.it  This test is not yet approved or cleared by the [place]  FDA and has been authorized for detection and/or diagnosis of SARS-CoV-2 by FDA under an Emergency Use Authorization (EUA). This EUA will remain in effect (meaning this test can be used) for the duration of the COVID-19 declaration under Section 564(b)(1) of the Act, 21 U.S.C. section 360bbb-3(b)(1), unless the authorization is terminated or revoked.  Performed at [facility], [address] [place], [place] [address]   Culture, blood (Routine X 2) w Reflex to ID Panel     Status: None (Preliminary result)   Collection Time: 09/06/23  8:17 AM   Specimen: BLOOD  Result Value Ref Range Status   Specimen Description   Final    BLOOD BLOOD RIGHT ARM Performed  at [facility], [address] [place], [place] [address]    Special Requests   Final    BOTTLES DRAWN AEROBIC AND ANAEROBIC Blood Culture adequate volume Performed at [facility], [address] [place], [place] [address]    Culture   Final    NO GROWTH 3 DAYS Performed at [facility], [address] [place], [place] [address]    Report Status PENDING  Incomplete  Culture, blood (Routine X 2) w Reflex to ID Panel     Status: None (Preliminary result)   Collection Time: 09/06/23  8:17 AM   Specimen: BLOOD  Result Value Ref Range Status   Specimen Description   Final    BLOOD BLOOD RIGHT HAND Performed at [facility], [address], [place], [place] [address]    Special Requests   Final    BOTTLES DRAWN AEROBIC AND ANAEROBIC Blood Culture adequate volume Performed at [facility], [address] [place], [place] [address]    Culture   Final    NO GROWTH 3 DAYS Performed at [facility], [address] [place], [place] [address]    Report Status PENDING  Incomplete  Urine Culture (for pregnant, neutropenic or urologic patients or patients with an indwelling urinary catheter)     Status: Abnormal   Collection Time: 09/06/23 12:20 PM   Specimen: Urine, Clean Catch  Result Value Ref Range Status   Specimen Description   Final    URINE, CLEAN CATCH Performed at [facility], [address] [place], [place] [address]    Special Requests   Final    NONE Performed at [facility], [address], [place], [place] [address]    Culture (A)  Final    >=100,000 COLONIES/mL PSEUDOMONAS AERUGINOSA >=100,000 COLONIES/mL ENTEROCOCCUS FAECALIS    Report Status 09/09/2023 FINAL  Final   Organism ID, Bacteria PSEUDOMONAS AERUGINOSA (A)  Final   Organism ID, Bacteria ENTEROCOCCUS FAECALIS (A)  Final      Susceptibility   Enterococcus faecalis - MIC*    AMPICILLIN <=2 SENSITIVE  Sensitive     NITROFURANTOIN  <=16 SENSITIVE Sensitive     VANCOMYCIN  2 SENSITIVE Sensitive     * >=100,000 COLONIES/mL ENTEROCOCCUS FAECALIS   Pseudomonas aeruginosa - MIC*    MEROPENEM 1 SENSITIVE Sensitive     CIPROFLOXACIN 0.5 SENSITIVE Sensitive     IMIPENEM 2 SENSITIVE Sensitive     PIP/TAZO Value in next row Sensitive ug/mL     16 SENSITIVEThis is a modified FDA-approved test that has been validated and its performance characteristics determined by the reporting laboratory.  This laboratory is certified under the Clinical Laboratory Improvement Amendments CLIA as qualified to perform high complexity clinical laboratory testing.    CEFTAZIDIME/AVIBACTAM Value in next row Sensitive ug/mL     16 SENSITIVEThis is a modified FDA-approved test that has been validated and its performance characteristics determined by the reporting laboratory.  This laboratory is certified under the Clinical Laboratory Improvement Amendments CLIA as qualified to perform high complexity clinical laboratory testing.    CEFTOLOZANE/TAZOBACTAM Value in next row Resistant ug/mL     16 SENSITIVEThis is a modified FDA-approved test that has been validated and its performance characteristics determined by the reporting laboratory.  This laboratory is certified under the Clinical Laboratory Improvement Amendments CLIA as qualified to perform high complexity clinical laboratory testing.    TOBRAMYCIN Value in next row Sensitive      16 SENSITIVEThis is a modified FDA-approved test that has been validated and its performance characteristics determined by the reporting laboratory.  This laboratory is certified under the Clinical Laboratory Improvement Amendments CLIA as qualified to perform high complexity clinical laboratory testing.    CEFTAZIDIME Value in next row Sensitive      16 SENSITIVEThis is a modified FDA-approved test that has been validated and its performance characteristics determined by the reporting laboratory.   This laboratory is certified under the Clinical Laboratory Improvement Amendments CLIA as qualified to perform high complexity clinical laboratory testing.    * >=100,000 COLONIES/mL PSEUDOMONAS AERUGINOSA         Radiology Studies: CT CHEST ABDOMEN PELVIS W CONTRAST Result Date: 09/07/2023 CLINICAL DATA:  Diffuse abdominal pain.  Sepsis.  Fever EXAM: CT CHEST, ABDOMEN, AND PELVIS WITH CONTRAST TECHNIQUE: Multidetector CT imaging of the chest, abdomen and pelvis was performed following the standard protocol during bolus administration of  intravenous contrast. RADIATION DOSE REDUCTION: This exam was performed according to the departmental dose-optimization program which includes automated exposure control, adjustment of the mA and/or kV according to patient size and/or use of iterative reconstruction technique. CONTRAST:  OMNIPAQUE  IOHEXOL  300 MG/ML  SOLN COMPARISON:  None FINDINGS: CT CHEST FINDINGS Cardiovascular: No significant vascular findings. Normal heart size. No pericardial effusion. Mediastinum/Nodes: No axillary or supraclavicular adenopathy. No mediastinal or hilar adenopathy. No pericardial fluid. Esophagus normal. Lungs/Pleura: Moderate LEFT effusion. There is passive atelectasis of the LEFT lower lobe posterior lingula. No evidence of pneumonia. Small RIGHT effusion. Musculoskeletal: No aggressive osseous lesion. CT ABDOMEN AND PELVIS FINDINGS Hepatobiliary: Hypodensity in the central liver simple fluid attenuation most consistent benign cysts. Postcholecystectomy. No biliary duct dilatation. Pancreas: Pancreas is normal. No ductal dilatation. No pancreatic inflammation. Spleen: Normal spleen Adrenals/urinary tract: Adrenal glands and kidneys are normal. The ureters and bladder normal. Stomach/Bowel: Stomach, small bowel, appendix, and cecum are normal. The colon and rectosigmoid colon are normal. Vascular/Lymphatic: Abdominal aorta is normal caliber with atherosclerotic calcification.  There is no retroperitoneal or periportal lymphadenopathy. No pelvic lymphadenopathy. Reproductive: Post hysterectomy.  Adnexa unremarkable Other: No free fluid or abscess. Musculoskeletal: No aggressive osseous lesion. IMPRESSION: CHEST: 1. Moderate LEFT effusion and small RIGHT effusion. 2. Passive atelectasis of the LEFT lower lobe and lingula. 3. No evidence of pneumonia. PELVIS: 1. No acute findings in the abdomen pelvis. 2. No evidence of infection. 3.  Aortic Atherosclerosis (ICD10-I70.0). Electronically Signed   By: Jackquline Boxer M.D.   On: 09/07/2023 14:59         Scheduled Meds:  amiodarone   100 mg Oral Daily   apixaban   5 mg Oral BID   atorvastatin   40 mg Oral Daily   diltiazem   120 mg Oral Daily   divalproex   1,500 mg Oral QHS   famotidine   20 mg Oral BID   fluticasone  furoate-vilanterol  1 puff Inhalation Daily   furosemide   40 mg Oral Daily   metoprolol  tartrate  25 mg Oral BID   mirabegron  ER  25 mg Oral QHS   polyethylene glycol  17 g Oral Daily   potassium chloride  SA  20 mEq Oral Daily   senna-docusate  2 tablet Oral QHS   Continuous Infusions:  piperacillin -tazobactam (ZOSYN )  IV 3.375 g (09/09/23 0608)   promethazine  (PHENERGAN ) injection (IM or IVPB) Stopped (09/07/23 0959)     LOS: 3 days    Time spent: 45 minutes    Renato Applebaum, MD [facility]

## 2023-09-09 NOTE — TOC Progression Note (Signed)
 Transition of Care [facility]) - Progression Note    Patient Details  Name: Donna Sexton MRN: [id number] Date of Birth: 1950-07-21  [facility] CM/SW Contact  Sheri ONEIDA [place], [place] Phone Number: 09/09/2023, 12:46 PM  Clinical Narrative:    Ins auth approved; possible dc tomorrow if medically ready.   Expected Discharge Plan: Skilled Nursing Facility Barriers to Discharge: Insurance Authorization               Expected Discharge Plan and Services                                               Social Drivers of Health (SDOH) Interventions SDOH Screenings   Food Insecurity: No Food Insecurity (09/05/2023)  Recent Concern: Food Insecurity - Food Insecurity Present (06/26/2023)  Housing: Low Risk  (09/05/2023)  Transportation Needs: No Transportation Needs (09/05/2023)  Utilities: Not At Risk (09/05/2023)  Alcohol Screen: Low Risk  (03/28/2022)  Depression (PHQ2-9): Medium Risk (06/26/2023)  Financial Resource Strain: Low Risk  (06/26/2023)  Physical Activity: Inactive (06/26/2023)  Social Connections: Socially Isolated (09/05/2023)  Stress: Stress Concern Present (06/26/2023)  Tobacco Use: Low Risk  (09/05/2023)    Readmission Risk Interventions    09/09/2023   12:46 PM  Readmission Risk Prevention Plan  Transportation Screening Complete  PCP or Specialist Appt within 3-5 Days Complete  HRI or Home Care Consult Complete  Social Work Consult for Recovery Care Planning/Counseling Complete  Palliative Care Screening Not Applicable  Medication Review Oceanographer) Complete

## 2023-09-10 DIAGNOSIS — J9601 Acute respiratory failure with hypoxia: Secondary | ICD-10-CM | POA: Diagnosis not present

## 2023-09-10 LAB — CBC WITH DIFFERENTIAL/PLATELET
Abs Immature Granulocytes: 0.05 K/uL (ref 0.00–0.07)
Basophils Absolute: 0 K/uL (ref 0.0–0.1)
Basophils Relative: 1 %
Eosinophils Absolute: 0.3 K/uL (ref 0.0–0.5)
Eosinophils Relative: 8 %
HCT: 27.4 % — ABNORMAL LOW (ref 36.0–46.0)
Hemoglobin: 8.9 g/dL — ABNORMAL LOW (ref 12.0–15.0)
Immature Granulocytes: 1 %
Lymphocytes Relative: 29 %
Lymphs Abs: 1.1 K/uL (ref 0.7–4.0)
MCH: 33.1 pg (ref 26.0–34.0)
MCHC: 32.5 g/dL (ref 30.0–36.0)
MCV: 101.9 fL — ABNORMAL HIGH (ref 80.0–100.0)
Monocytes Absolute: 0.6 K/uL (ref 0.1–1.0)
Monocytes Relative: 16 %
Neutro Abs: 1.7 K/uL (ref 1.7–7.7)
Neutrophils Relative %: 45 %
Platelets: 167 K/uL (ref 150–400)
RBC: 2.69 MIL/uL — ABNORMAL LOW (ref 3.87–5.11)
RDW: 13.1 % (ref 11.5–15.5)
WBC: 3.7 K/uL — ABNORMAL LOW (ref 4.0–10.5)
nRBC: 0 % (ref 0.0–0.2)

## 2023-09-10 LAB — COMPREHENSIVE METABOLIC PANEL WITH GFR
ALT: 13 U/L (ref 0–44)
AST: 25 U/L (ref 15–41)
Albumin: 2.9 g/dL — ABNORMAL LOW (ref 3.5–5.0)
Alkaline Phosphatase: 49 U/L (ref 38–126)
Anion gap: 10 (ref 5–15)
BUN: 12 mg/dL (ref 8–23)
CO2: 28 mmol/L (ref 22–32)
Calcium: 9 mg/dL (ref 8.9–10.3)
Chloride: 99 mmol/L (ref 98–111)
Creatinine, Ser: 0.53 mg/dL (ref 0.44–1.00)
GFR, Estimated: >60 mL/min (ref >60)
Glucose, Bld: 96 mg/dL (ref 70–99)
Potassium: 3.5 mmol/L (ref 3.5–5.1)
Sodium: 137 mmol/L (ref 135–145)
Total Bilirubin: 0.3 mg/dL (ref 0.0–1.2)
Total Protein: 5.1 g/dL — ABNORMAL LOW (ref 6.5–8.1)

## 2023-09-10 LAB — MAGNESIUM: Magnesium: 2 mg/dL (ref 1.7–2.4)

## 2023-09-10 LAB — PHOSPHORUS: Phosphorus: 3.2 mg/dL (ref 2.5–4.6)

## 2023-09-10 NOTE — Plan of Care (Signed)

## 2023-09-10 NOTE — Progress Notes (Signed)
 PROGRESS NOTE    Donna Sexton  FMW:[id number] DOB: Nov 15, 1950 DOA: 09/05/2023 PCP: Alvan Dorothyann BIRCH, MD    Brief Narrative:  Patient is 73 year old female with history of chronic hypoxemic failure due to COVID-19 infection, anxiety, bipolar disorder, GERD, hyperlipidemia and hypertension, recurrent UTI, paroxysmal A-fib and chronic diastolic dysfunction who is currently at a skilled nursing rehab for the last 5 weeks sent to the emergency room with hypotension and lethargy.  When EMS arrived, she was found to be hypoxemic in the 80s.  Patient was complaining of orthopnea.  Leg swelling.  Initially no reporting of fever or signs of infection.  At the emergency room she was started on oxygen, given Lasix .  White cell count 9.8.  COVID, influenza and RSV negative.  BNP was 511.  Chest x-ray showed cardiomegaly with retrocardiac atelectasis.  CT head without any acute abnormality. After admission, overnight patient started having low-grade fever and profound lethargy.  Patient was found to have a UTI due to multiple organisms.  Subjective:  Patient seen and examined.  Much more comfortable today.  Epigastric pain and back pain has improved.  She has started eating now.  Agreeable with plan to go back to a SNF.  But bed is not available.  Afebrile.  Assessment & Plan:   Acute respiratory failure with hypoxemia, fluid overload likely secondary to diastolic congestive heart failure: Keep on oxygen to keep saturation more than 90%.  Diuretics were held secondary to clinical dehydration and acute infection.  Started back on Lasix  40 mg daily with improvement.  Continue beta-blockers. Recent echocardiogram with normal ejection fraction, has diastolic dysfunction.Input output monitoring.    Acute UTI, present on admission causing acute metabolic encephalopathy. Blood cultures drawn, negative so far. Urine culture with Pseudomonas and Enterococcus faecalis.  Clinically improving on Zosyn .  Will  continue Zosyn  while she is in the hospital.  Will give fosfomycin 3 g on the day of discharge.   patient's recurrent UTI, she will benefit with long-term suppressive therapy.  Will discharge patient on long-term Macrobid  therapy. CT scan of chest abdomen pelvis was essentially normal.  Continue symptomatic managemen  Acute metabolic encephalopathy: Underlying history of bipolar disorder.  Patient has remained quite lethargic.  Improving now. Blood gas reviewed normal pH and normal CO2. Ammonia level normal.  Depakote  level normal.  CT head normal.  GERD: On Pepcid .  Oral Pepcid  twice daily.  Hyperlipidemia: On statin.  Continued.  Essential hypertension: As above on metoprolol  and Cardizem .  Stable.  Paroxysmal A-fib: Currently sinus rhythm.  On amiodarone , Cardizem , metoprolol .  Therapeutic on Eliquis .  Disposition: SNF when bed available.      DVT prophylaxis: apixaban  (ELIQUIS ) tablet 5 mg Start: 09/05/23 1400 apixaban  (ELIQUIS ) tablet 5 mg   Code Status: Full code Family Communication: Ex- husband at the bedside.  Son on the phone. Disposition Plan: Status is: Inpatient.  Medically stable.  Discharge when SNF bed available.     Consultants:  None  Procedures:  None  Antimicrobials:  Rocephin  8/27--- 8/28 Zosyn  8/28---     Objective: Vitals:   09/10/23 0500 09/10/23 0605 09/10/23 0736 09/10/23 1001  BP:  (!) 144/90  (!) 152/88  Pulse:  77    Resp:  18    Temp:  98.2 F (36.8 C)    TempSrc:  Oral    SpO2:  99% 96%   Weight: 107.1 kg     Height:        Intake/Output Summary (Last 24 hours) at  09/10/2023 1136 Last data filed at 09/09/2023 1559 Gross per 24 hour  Intake 150 ml  Output 200 ml  Net -50 ml   Filed Weights   09/08/23 0500 09/09/23 0500 09/10/23 0500  Weight: 107 kg 107.3 kg 107.1 kg    Examination:  General exam: Comfortable.  On room air.  Chronically sick looking and frail but not in any distress.  Pleasant and  interactive. Respiratory system: Clear to auscultation. Respiratory effort normal.  No added sounds. Cardiovascular system: S1 & S2 heard, RRR.  Chronically edematous legs, lymphedema present.  1+ pedal edema present.  Mostly chronic nonpitting edema. Gastrointestinal system: Abdomen is nondistended, soft and nontender. No organomegaly or masses felt. Normal bowel sounds heard.  Obese and pendulous. Central nervous system: Alert awake and oriented.  Gross generalized weakness..     Data Reviewed: I have personally reviewed following labs and imaging studies  CBC: Recent Labs  Lab 09/05/23 0653 09/06/23 0532 09/07/23 0508 09/10/23 0543  WBC 9.8 9.1 7.9 3.7*  NEUTROABS 6.1  --  5.2 1.7  HGB 11.1* 10.4* 9.8* 8.9*  HCT 35.0* 31.9* 30.7* 27.4*  MCV 106.1* 103.9* 104.1* 101.9*  PLT 184 153 146* 167   Basic Metabolic Panel: Recent Labs  Lab 09/05/23 0812 09/06/23 0532 09/07/23 0508 09/10/23 0543  NA 135 136 137 137  K 3.9 3.5 3.4* 3.5  CL 99 97* 99 99  CO2 26 26 26 28   GLUCOSE 95 112* 95 96  BUN 12 13 17 12   CREATININE 0.64 0.68 0.68 0.53  CALCIUM  8.7* 9.1 8.6* 9.0  MG  --   --  1.9 2.0  PHOS  --   --  3.5 3.2   GFR: Estimated Creatinine Clearance: 73.7 mL/min (by C-G formula based on SCr of 0.53 mg/dL). Liver Function Tests: Recent Labs  Lab 09/05/23 0812 09/06/23 0532 09/07/23 0508 09/10/23 0543  AST 15 12* <10* 25  ALT 14 9 7 13   ALKPHOS 51 50 45 49  BILITOT 1.0 1.3* 0.7 0.3  PROT 5.9* 5.3* 5.0* 5.1*  ALBUMIN 3.2* 3.2* 2.8* 2.9*   No results for input(s): LIPASE, AMYLASE in the last 168 hours. Recent Labs  Lab 09/05/23 1100  AMMONIA 28   Coagulation Profile: No results for input(s): INR, PROTIME in the last 168 hours. Cardiac Enzymes: No results for input(s): CKTOTAL, CKMB, CKMBINDEX, TROPONINI in the last 168 hours. BNP (last 3 results) No results for input(s): PROBNP in the last 8760 hours. HbA1C: No results for input(s): HGBA1C  in the last 72 hours. CBG: Recent Labs  Lab 09/05/23 0646  GLUCAP 97   Lipid Profile: No results for input(s): CHOL, HDL, LDLCALC, TRIG, CHOLHDL, LDLDIRECT in the last 72 hours. Thyroid  Function Tests: No results for input(s): TSH, T4TOTAL, FREET4, T3FREE, THYROIDAB in the last 72 hours. Anemia Panel: No results for input(s): VITAMINB12, FOLATE, FERRITIN, TIBC, IRON, RETICCTPCT in the last 72 hours.  Sepsis Labs: Recent Labs  Lab 09/06/23 0817  PROCALCITON 0.14    Recent Results (from the past 240 hours)  Resp panel by RT-PCR (RSV, Flu A&B, Covid) Anterior Nasal Swab     Status: None   Collection Time: 09/05/23  6:50 AM   Specimen: Anterior Nasal Swab  Result Value Ref Range Status   SARS Coronavirus 2 by RT PCR NEGATIVE NEGATIVE Final    Comment: (NOTE) SARS-CoV-2 target nucleic acids are NOT DETECTED.  The SARS-CoV-2 RNA is generally detectable in upper respiratory specimens during the acute phase of infection.  The lowest concentration of SARS-CoV-2 viral copies this assay can detect is 138 copies/mL. A negative result does not preclude SARS-Cov-2 infection and should not be used as the sole basis for treatment or other patient management decisions. A negative result may occur with  improper specimen collection/handling, submission of specimen other than nasopharyngeal swab, presence of viral mutation(s) within the areas targeted by this assay, and inadequate number of viral copies(<138 copies/mL). A negative result must be combined with clinical observations, patient history, and epidemiological information. The expected result is Negative.  Fact Sheet for Patients:  BloggerCourse.com  Fact Sheet for Healthcare Providers:  SeriousBroker.it  This test is no t yet approved or cleared by the [place]  FDA and  has been authorized for detection and/or diagnosis of SARS-CoV-2 by FDA  under an Emergency Use Authorization (EUA). This EUA will remain  in effect (meaning this test can be used) for the duration of the COVID-19 declaration under Section 564(b)(1) of the Act, 21 U.S.C.section 360bbb-3(b)(1), unless the authorization is terminated  or revoked sooner.       Influenza A by PCR NEGATIVE NEGATIVE Final   Influenza B by PCR NEGATIVE NEGATIVE Final    Comment: (NOTE) The Xpert Xpress SARS-CoV-2/FLU/RSV plus assay is intended as an aid in the diagnosis of influenza from Nasopharyngeal swab specimens and should not be used as a sole basis for treatment. Nasal washings and aspirates are unacceptable for Xpert Xpress SARS-CoV-2/FLU/RSV testing.  Fact Sheet for Patients: BloggerCourse.com  Fact Sheet for Healthcare Providers: SeriousBroker.it  This test is not yet approved or cleared by the [place]  FDA and has been authorized for detection and/or diagnosis of SARS-CoV-2 by FDA under an Emergency Use Authorization (EUA). This EUA will remain in effect (meaning this test can be used) for the duration of the COVID-19 declaration under Section 564(b)(1) of the Act, 21 U.S.C. section 360bbb-3(b)(1), unless the authorization is terminated or revoked.     Resp Syncytial Virus by PCR NEGATIVE NEGATIVE Final    Comment: (NOTE) Fact Sheet for Patients: BloggerCourse.com  Fact Sheet for Healthcare Providers: SeriousBroker.it  This test is not yet approved or cleared by the [place]  FDA and has been authorized for detection and/or diagnosis of SARS-CoV-2 by FDA under an Emergency Use Authorization (EUA). This EUA will remain in effect (meaning this test can be used) for the duration of the COVID-19 declaration under Section 564(b)(1) of the Act, 21 U.S.C. section 360bbb-3(b)(1), unless the authorization is terminated or revoked.  Performed at [facility], [address] [place], [place] [address]   Culture, blood (Routine X 2) w Reflex to ID Panel     Status: None (Preliminary result)   Collection Time: 09/06/23  8:17 AM   Specimen: BLOOD  Result Value Ref Range Status   Specimen Description   Final    BLOOD BLOOD RIGHT ARM Performed at [facility], [address] [place], [place] [address]    Special Requests   Final    BOTTLES DRAWN AEROBIC AND ANAEROBIC Blood Culture adequate volume Performed at [facility], [address] [place], [place] [address]    Culture   Final    NO GROWTH 4 DAYS Performed at [facility], [address], [place], [place] [address]    Report Status PENDING  Incomplete  Culture, blood (Routine X 2) w Reflex to ID Panel     Status: None (Preliminary result)   Collection Time: 09/06/23  8:17 AM   Specimen: BLOOD  Result Value Ref Range Status   Specimen Description   Final    BLOOD BLOOD RIGHT HAND Performed at [facility], [address], [place], [place] [address]    Special Requests   Final    BOTTLES DRAWN AEROBIC AND ANAEROBIC Blood Culture adequate volume Performed at [facility], [address], [place], [place] [address]    Culture   Final    NO GROWTH 4 DAYS Performed at [facility], [address], [place], [place] [address]    Report Status PENDING  Incomplete  Urine Culture (for pregnant, neutropenic or urologic patients or patients with an indwelling urinary catheter)     Status: Abnormal   Collection Time: 09/06/23 12:20 PM   Specimen: Urine, Clean Catch  Result Value Ref Range Status   Specimen Description   Final    URINE, CLEAN CATCH Performed at [facility], [address] [place], [place] [address]    Special Requests   Final    NONE Performed at [facility], [address], [place], [place] [address]    Culture (A)  Final    >=100,000  COLONIES/mL PSEUDOMONAS AERUGINOSA >=100,000 COLONIES/mL ENTEROCOCCUS FAECALIS    Report Status 09/09/2023 FINAL  Final   Organism ID, Bacteria PSEUDOMONAS AERUGINOSA (A)  Final   Organism ID, Bacteria ENTEROCOCCUS FAECALIS (A)  Final      Susceptibility   Enterococcus faecalis - MIC*    AMPICILLIN <=2 SENSITIVE Sensitive     NITROFURANTOIN  <=16 SENSITIVE Sensitive     VANCOMYCIN  2 SENSITIVE Sensitive     * >=100,000 COLONIES/mL ENTEROCOCCUS FAECALIS   Pseudomonas aeruginosa - MIC*    MEROPENEM 1 SENSITIVE Sensitive     CIPROFLOXACIN 0.5 SENSITIVE Sensitive     IMIPENEM 2 SENSITIVE Sensitive     PIP/TAZO Value in next row Sensitive ug/mL     16 SENSITIVEThis is a modified FDA-approved test that has been validated and its performance characteristics determined by the reporting laboratory.  This laboratory is certified under the Clinical Laboratory Improvement Amendments CLIA as qualified to perform high complexity clinical laboratory testing.    CEFTAZIDIME/AVIBACTAM Value in next row Sensitive ug/mL     16 SENSITIVEThis is a modified FDA-approved test that has been validated and its performance characteristics determined by the reporting laboratory.  This laboratory is certified under the Clinical Laboratory Improvement Amendments CLIA as qualified to perform high complexity clinical laboratory testing.    CEFTOLOZANE/TAZOBACTAM Value in next row Resistant ug/mL     16 SENSITIVEThis is a modified FDA-approved test that has been validated and its performance characteristics determined by the reporting laboratory.  This laboratory is certified under the Clinical Laboratory Improvement Amendments CLIA as qualified to perform high complexity clinical laboratory testing.    TOBRAMYCIN Value in next row Sensitive      16 SENSITIVEThis is a modified FDA-approved test that has been validated and its performance characteristics determined by the reporting laboratory.  This laboratory is certified under  the Clinical Laboratory Improvement Amendments CLIA as qualified to perform high complexity clinical laboratory testing.    CEFTAZIDIME Value in next row Sensitive      16 SENSITIVEThis is a modified FDA-approved test that has been validated and its performance characteristics determined by the reporting laboratory.  This laboratory is certified under the Clinical Laboratory Improvement Amendments CLIA as qualified to perform high complexity clinical laboratory testing.    * >=  100,000 COLONIES/mL PSEUDOMONAS AERUGINOSA         Radiology Studies: No results found.        Scheduled Meds:  amiodarone   100 mg Oral Daily   apixaban   5 mg Oral BID   atorvastatin   40 mg Oral Daily   diltiazem   120 mg Oral Daily   divalproex   1,500 mg Oral QHS   famotidine   20 mg Oral BID   fluticasone  furoate-vilanterol  1 puff Inhalation Daily   furosemide   40 mg Oral Daily   metoprolol  tartrate  25 mg Oral BID   mirabegron  ER  25 mg Oral QHS   polyethylene glycol  17 g Oral Daily   potassium chloride  SA  20 mEq Oral Daily   senna-docusate  2 tablet Oral QHS   Continuous Infusions:  piperacillin -tazobactam (ZOSYN )  IV 3.375 g (09/10/23 0605)   promethazine  (PHENERGAN ) injection (IM or IVPB) Stopped (09/07/23 0959)     LOS: 4 days    Time spent: 40 minutes   Renato Applebaum, MD [facility]

## 2023-09-10 NOTE — Plan of Care (Signed)
   Problem: Education: Goal: Knowledge of General Education information will improve Description Including pain rating scale, medication(s)/side effects and non-pharmacologic comfort measures Outcome: Progressing

## 2023-09-11 DIAGNOSIS — J9601 Acute respiratory failure with hypoxia: Secondary | ICD-10-CM | POA: Diagnosis not present

## 2023-09-11 LAB — CULTURE, BLOOD (ROUTINE X 2)
Culture: NO GROWTH
Culture: NO GROWTH
Special Requests: ADEQUATE
Special Requests: ADEQUATE

## 2023-09-11 MED ORDER — FOSFOMYCIN TROMETHAMINE 3 G PO PACK
3.0000 g | PACK | Freq: Once | ORAL | Status: AC
  Administered 2023-09-11: 3 g via ORAL
  Filled 2023-09-11: qty 3

## 2023-09-11 NOTE — Plan of Care (Signed)
  Problem: Pain Managment: Goal: General experience of comfort will improve and/or be controlled Outcome: Progressing   Problem: Safety: Goal: Ability to remain free from injury will improve Outcome: Progressing   Problem: Skin Integrity: Goal: Risk for impaired skin integrity will decrease Outcome: Progressing

## 2023-09-11 NOTE — Progress Notes (Signed)
 PROGRESS NOTE    Donna Sexton  FMW:[id number] DOB: [birth year] DOA: 09/05/2023 PCP: Alvan Dorothyann BIRCH, MD    Brief Narrative:  Patient is 73 year old female with history of hypoxemic failure due to COVID-19 infection, anxiety, bipolar disorder, GERD, hyperlipidemia and hypertension, recurrent UTI, paroxysmal A-fib and chronic diastolic dysfunction who is currently at a skilled nursing rehab for the last 5 weeks sent to the emergency room with hypotension and lethargy.  When EMS arrived, she was found to be hypoxemic in the 80s.  Patient was complaining of orthopnea.  Leg swelling.  Initially no reporting of fever or signs of infection.  At the emergency room she was started on oxygen, given Lasix .  White cell count 9.8.  COVID, influenza and RSV negative.  BNP was 511.  Chest x-ray showed cardiomegaly with retrocardiac atelectasis.  CT head without any acute abnormality. After admission, overnight patient started having low-grade fever and profound lethargy.  Patient was found to have a UTI due to multiple organisms.  Subjective:  Patient seen and examined.  No overnight events.  Appetite is fair.  Assessment & Plan:   Acute respiratory failure with hypoxemia, fluid overload likely secondary to diastolic congestive heart failure: Now on room air.  Back on oral Lasix .  Avoid aggressive diuresis. Continue beta-blockers. Recent echocardiogram with normal ejection fraction, has diastolic dysfunction.  Acute UTI, present on admission causing acute metabolic encephalopathy. Blood cultures negative. Urine culture with Pseudomonas and Enterococcus faecalis.  Clinically improving on Zosyn .  Day 4 of Zosyn  today.  Discontinue.  1 dose of fosfomycin.  patient's recurrent UTI, she will benefit with long-term suppressive therapy.  Will discharge patient on long-term Macrobid  therapy. CT scan of chest abdomen pelvis was essentially normal.  Continue symptomatic managemen  Acute metabolic  encephalopathy: Underlying history of bipolar disorder.  Patient was  very lethargic.  R Mental status is improved now.   Blood gas reviewed normal pH and normal CO2. Ammonia level normal.  Depakote  level normal.  CT head normal.  GERD: On Pepcid .  Oral Pepcid  twice daily.  Hyperlipidemia: On statin.  Continued.  Essential hypertension: As above on metoprolol  and Cardizem .  Stable.  Paroxysmal A-fib: Currently sinus rhythm.  On amiodarone , Cardizem , metoprolol .  Therapeutic on Eliquis .  Disposition: SNF when bed available.      DVT prophylaxis: apixaban  (ELIQUIS ) tablet 5 mg Start: 09/05/23 1400 apixaban  (ELIQUIS ) tablet 5 mg   Code Status: Full code Family Communication: Ex- husband at the bedside.   Disposition Plan: Status is: Inpatient.  Medically stable.  Discharge when SNF bed available.     Consultants:  None  Procedures:  None  Antimicrobials:  Rocephin  8/27--- 8/28 Zosyn  8/28--- 9/1     Objective: Vitals:   09/10/23 2035 09/11/23 0500 09/11/23 0556 09/11/23 0806  BP: 129/73  114/77   Pulse: (!) 110  (!) 105   Resp: 18  16   Temp: 98.2 F (36.8 C)  98.1 F (36.7 C)   TempSrc: Oral  Oral   SpO2: 100%  97% 92%  Weight:  105.9 kg    Height:        Intake/Output Summary (Last 24 hours) at 09/11/2023 1147 Last data filed at 09/11/2023 0936 Gross per 24 hour  Intake 120 ml  Output 1000 ml  Net -880 ml   Filed Weights   09/09/23 0500 09/10/23 0500 09/11/23 0500  Weight: 107.3 kg 107.1 kg 105.9 kg    Examination:  General exam: Comfortable. Respiratory system: Clear to auscultation.  Respiratory effort normal.  No added sounds. Cardiovascular system: S1 & S2 heard, RRR.  Chronically edematous legs, lymphedema present.  Mostly chronic pedal edema.   Gastrointestinal system: Abdomen is nondistended, soft and nontender. No organomegaly or masses felt. Normal bowel sounds heard.  Obese and pendulous. Central nervous system: Alert awake and oriented.   Gross generalized weakness..     Data Reviewed: I have personally reviewed following labs and imaging studies  CBC: Recent Labs  Lab 09/05/23 0653 09/06/23 0532 09/07/23 0508 09/10/23 0543  WBC 9.8 9.1 7.9 3.7*  NEUTROABS 6.1  --  5.2 1.7  HGB 11.1* 10.4* 9.8* 8.9*  HCT 35.0* 31.9* 30.7* 27.4*  MCV 106.1* 103.9* 104.1* 101.9*  PLT 184 153 146* 167   Basic Metabolic Panel: Recent Labs  Lab 09/05/23 0812 09/06/23 0532 09/07/23 0508 09/10/23 0543  NA 135 136 137 137  K 3.9 3.5 3.4* 3.5  CL 99 97* 99 99  CO2 26 26 26 28   GLUCOSE 95 112* 95 96  BUN 12 13 17 12   CREATININE 0.64 0.68 0.68 0.53  CALCIUM  8.7* 9.1 8.6* 9.0  MG  --   --  1.9 2.0  PHOS  --   --  3.5 3.2   GFR: Estimated Creatinine Clearance: 73.2 mL/min (by C-G formula based on SCr of 0.53 mg/dL). Liver Function Tests: Recent Labs  Lab 09/05/23 0812 09/06/23 0532 09/07/23 0508 09/10/23 0543  AST 15 12* <10* 25  ALT 14 9 7 13   ALKPHOS 51 50 45 49  BILITOT 1.0 1.3* 0.7 0.3  PROT 5.9* 5.3* 5.0* 5.1*  ALBUMIN 3.2* 3.2* 2.8* 2.9*   No results for input(s): LIPASE, AMYLASE in the last 168 hours. Recent Labs  Lab 09/05/23 1100  AMMONIA 28   Coagulation Profile: No results for input(s): INR, PROTIME in the last 168 hours. Cardiac Enzymes: No results for input(s): CKTOTAL, CKMB, CKMBINDEX, TROPONINI in the last 168 hours. BNP (last 3 results) No results for input(s): PROBNP in the last 8760 hours. HbA1C: No results for input(s): HGBA1C in the last 72 hours. CBG: Recent Labs  Lab 09/05/23 0646  GLUCAP 97   Lipid Profile: No results for input(s): CHOL, HDL, LDLCALC, TRIG, CHOLHDL, LDLDIRECT in the last 72 hours. Thyroid  Function Tests: No results for input(s): TSH, T4TOTAL, FREET4, T3FREE, THYROIDAB in the last 72 hours. Anemia Panel: No results for input(s): VITAMINB12, FOLATE, FERRITIN, TIBC, IRON, RETICCTPCT in the last 72 hours.  Sepsis  Labs: Recent Labs  Lab 09/06/23 0817  PROCALCITON 0.14    Recent Results (from the past 240 hours)  Resp panel by RT-PCR (RSV, Flu A&B, Covid) Anterior Nasal Swab     Status: None   Collection Time: 09/05/23  6:50 AM   Specimen: Anterior Nasal Swab  Result Value Ref Range Status   SARS Coronavirus 2 by RT PCR NEGATIVE NEGATIVE Final    Comment: (NOTE) SARS-CoV-2 target nucleic acids are NOT DETECTED.  The SARS-CoV-2 RNA is generally detectable in upper respiratory specimens during the acute phase of infection. The lowest concentration of SARS-CoV-2 viral copies this assay can detect is 138 copies/mL. A negative result does not preclude SARS-Cov-2 infection and should not be used as the sole basis for treatment or other patient management decisions. A negative result may occur with  improper specimen collection/handling, submission of specimen other than nasopharyngeal swab, presence of viral mutation(s) within the areas targeted by this assay, and inadequate number of viral copies(<138 copies/mL). A negative result must be combined  with clinical observations, patient history, and epidemiological information. The expected result is Negative.  Fact Sheet for Patients:  BloggerCourse.com  Fact Sheet for Healthcare Providers:  SeriousBroker.it  This test is no t yet approved or cleared by the [place]  FDA and  has been authorized for detection and/or diagnosis of SARS-CoV-2 by FDA under an Emergency Use Authorization (EUA). This EUA will remain  in effect (meaning this test can be used) for the duration of the COVID-19 declaration under Section 564(b)(1) of the Act, 21 U.S.C.section 360bbb-3(b)(1), unless the authorization is terminated  or revoked sooner.       Influenza A by PCR NEGATIVE NEGATIVE Final   Influenza B by PCR NEGATIVE NEGATIVE Final    Comment: (NOTE) The Xpert Xpress SARS-CoV-2/FLU/RSV plus assay is  intended as an aid in the diagnosis of influenza from Nasopharyngeal swab specimens and should not be used as a sole basis for treatment. Nasal washings and aspirates are unacceptable for Xpert Xpress SARS-CoV-2/FLU/RSV testing.  Fact Sheet for Patients: BloggerCourse.com  Fact Sheet for Healthcare Providers: SeriousBroker.it  This test is not yet approved or cleared by the [place]  FDA and has been authorized for detection and/or diagnosis of SARS-CoV-2 by FDA under an Emergency Use Authorization (EUA). This EUA will remain in effect (meaning this test can be used) for the duration of the COVID-19 declaration under Section 564(b)(1) of the Act, 21 U.S.C. section 360bbb-3(b)(1), unless the authorization is terminated or revoked.     Resp Syncytial Virus by PCR NEGATIVE NEGATIVE Final    Comment: (NOTE) Fact Sheet for Patients: BloggerCourse.com  Fact Sheet for Healthcare Providers: SeriousBroker.it  This test is not yet approved or cleared by the [place]  FDA and has been authorized for detection and/or diagnosis of SARS-CoV-2 by FDA under an Emergency Use Authorization (EUA). This EUA will remain in effect (meaning this test can be used) for the duration of the COVID-19 declaration under Section 564(b)(1) of the Act, 21 U.S.C. section 360bbb-3(b)(1), unless the authorization is terminated or revoked.  Performed at [facility], [address], [place], [place] [address]   Culture, blood (Routine X 2) w Reflex to ID Panel     Status: None   Collection Time: 09/06/23  8:17 AM   Specimen: BLOOD  Result Value Ref Range Status   Specimen Description   Final    BLOOD BLOOD RIGHT ARM Performed at [facility], [address] [place], [place] [address]    Special Requests   Final    BOTTLES DRAWN AEROBIC AND ANAEROBIC Blood Culture adequate  volume Performed at [facility], [address], [place], [place] [address]    Culture   Final    NO GROWTH 5 DAYS Performed at [facility], [address], [place], [place] [address]    Report Status 09/11/2023 FINAL  Final  Culture, blood (Routine X 2) w Reflex to ID Panel     Status: None   Collection Time: 09/06/23  8:17 AM   Specimen: BLOOD  Result Value Ref Range Status   Specimen Description   Final    BLOOD BLOOD RIGHT HAND Performed at [facility], [address], [place], [place] [address]    Special Requests   Final    BOTTLES DRAWN AEROBIC AND ANAEROBIC Blood Culture adequate volume Performed at [facility], [address], [place], [place] [address]    Culture   Final  NO GROWTH 5 DAYS Performed at [facility], [address] [place], [place] [address]    Report Status 09/11/2023 FINAL  Final  Urine Culture (for pregnant, neutropenic or urologic patients or patients with an indwelling urinary catheter)     Status: Abnormal   Collection Time: 09/06/23 12:20 PM   Specimen: Urine, Clean Catch  Result Value Ref Range Status   Specimen Description   Final    URINE, CLEAN CATCH Performed at [facility], [address] [place], [place] [address]    Special Requests   Final    NONE Performed at [facility], [address] [place], [place] [address]    Culture (A)  Final    >=100,000 COLONIES/mL PSEUDOMONAS AERUGINOSA >=100,000 COLONIES/mL ENTEROCOCCUS FAECALIS    Report Status 09/09/2023 FINAL  Final   Organism ID, Bacteria PSEUDOMONAS AERUGINOSA (A)  Final   Organism ID, Bacteria ENTEROCOCCUS FAECALIS (A)  Final      Susceptibility   Enterococcus faecalis - MIC*    AMPICILLIN <=2 SENSITIVE Sensitive     NITROFURANTOIN  <=16 SENSITIVE Sensitive     VANCOMYCIN  2 SENSITIVE Sensitive     * >=100,000 COLONIES/mL ENTEROCOCCUS FAECALIS   Pseudomonas aeruginosa -  MIC*    MEROPENEM 1 SENSITIVE Sensitive     CIPROFLOXACIN 0.5 SENSITIVE Sensitive     IMIPENEM 2 SENSITIVE Sensitive     PIP/TAZO Value in next row Sensitive ug/mL     16 SENSITIVEThis is a modified FDA-approved test that has been validated and its performance characteristics determined by the reporting laboratory.  This laboratory is certified under the Clinical Laboratory Improvement Amendments CLIA as qualified to perform high complexity clinical laboratory testing.    CEFTAZIDIME/AVIBACTAM Value in next row Sensitive ug/mL     16 SENSITIVEThis is a modified FDA-approved test that has been validated and its performance characteristics determined by the reporting laboratory.  This laboratory is certified under the Clinical Laboratory Improvement Amendments CLIA as qualified to perform high complexity clinical laboratory testing.    CEFTOLOZANE/TAZOBACTAM Value in next row Resistant ug/mL     16 SENSITIVEThis is a modified FDA-approved test that has been validated and its performance characteristics determined by the reporting laboratory.  This laboratory is certified under the Clinical Laboratory Improvement Amendments CLIA as qualified to perform high complexity clinical laboratory testing.    TOBRAMYCIN Value in next row Sensitive      16 SENSITIVEThis is a modified FDA-approved test that has been validated and its performance characteristics determined by the reporting laboratory.  This laboratory is certified under the Clinical Laboratory Improvement Amendments CLIA as qualified to perform high complexity clinical laboratory testing.    CEFTAZIDIME Value in next row Sensitive      16 SENSITIVEThis is a modified FDA-approved test that has been validated and its performance characteristics determined by the reporting laboratory.  This laboratory is certified under the Clinical Laboratory Improvement Amendments CLIA as qualified to perform high complexity clinical laboratory testing.    * >=100,000  COLONIES/mL PSEUDOMONAS AERUGINOSA         Radiology Studies: No results found.        Scheduled Meds:  amiodarone   100 mg Oral Daily   apixaban   5 mg Oral BID   atorvastatin   40 mg Oral Daily   diltiazem   120 mg Oral Daily   divalproex   1,500 mg Oral QHS   famotidine   20 mg Oral BID   fluticasone  furoate-vilanterol  1 puff  Inhalation Daily   fosfomycin  3 g Oral Once   furosemide   40 mg Oral Daily   metoprolol  tartrate  25 mg Oral BID   mirabegron  ER  25 mg Oral QHS   polyethylene glycol  17 g Oral Daily   potassium chloride  SA  20 mEq Oral Daily   senna-docusate  2 tablet Oral QHS   Continuous Infusions:     LOS: 5 days    Time spent: 40 minutes   Renato Applebaum, MD [facility]

## 2023-09-11 NOTE — Progress Notes (Signed)
 Noted injury to Left labia. Red open area was not present on this RN am assessment. Media taken

## 2023-09-11 NOTE — TOC Progression Note (Addendum)
 Transition of Care [facility]) - Progression Note   Patient Details  Name: Donna Sexton MRN: [id number] Date of Birth: [birth year]  [facility] CM/SW Contact  Duwaine GORMAN Aran, LCSW Phone Number: 09/11/2023, 11:12 AM  Clinical Narrative: CSW confirmed with [facility] in admissions that [facility] will not have a bed and the son was already notified that there is no bed availability. CSW provided patient and ex-husband, Lorene Search, with the following bed offers and Medicare star ratings:  [facility] [address] [place], [place] [address] [phone] Overall rating ??  Below average  [facility] [address] [place], [place] [address] [phone] Overall rating ? Much below average  [facility] [address] [place], [place] [address] [phone] Overall rating ?? Below average  [facility] [address] [place], [place] [address] [phone] Overall rating ? Much below average  [facility] [place] [address] [place], [place] [address] [phone] Overall rating ???? Above average  [facility] [address] [place], [place] [address] [phone] Overall rating ?? Below average  [facility] and [facility] [address] [place], [place] [address] [phone] Overall rating ? Much below average  [facility] [address] [place], [place] [address] [phone] Overall rating ? Much below average  [address] [place], [place] [address] [phone] Overall rating ??  CSW spoke with son, Sidra Sluder, when he arrived to the hospital regarding bed offers. Son expressed frustration with the Medicare star ratings. CSW explained patient is medically ready and can discharge to SNF once family  chooses a bed and the facility confirms admission. Care management awaiting bed choice.  Addendum: CSW received call from son and [facility] was chosen. CSW confirmed bed with [facility] with central intake. [facility] can accept the patient as soon as insurance authorization is approved. CSW attempted to call [facility] to switch the insurance authorization to a new facility, but it is closed today due to the holiday. CSW updated hospitalist and son.  Expected Discharge Plan: Skilled Nursing Facility Barriers to Discharge: Insurance Authorization  Social Drivers of Health (SDOH) Interventions SDOH Screenings   Food Insecurity: No Food Insecurity (09/05/2023)  Recent Concern: Food Insecurity - Food Insecurity Present (06/26/2023)  Housing: Low Risk  (09/05/2023)  Transportation Needs: No Transportation Needs (09/05/2023)  Utilities: Not At Risk (09/05/2023)  Alcohol Screen: Low Risk  (03/28/2022)  Depression (PHQ2-9): Medium Risk (06/26/2023)  Financial Resource Strain: Low Risk  (06/26/2023)  Physical Activity: Inactive (06/26/2023)  Social Connections: Socially Isolated (09/05/2023)  Stress: Stress Concern Present (06/26/2023)  Tobacco Use: Low Risk  (09/05/2023)   Readmission Risk Interventions    09/09/2023   12:46 PM  Readmission Risk Prevention Plan  Transportation Screening Complete  PCP or Specialist Appt within 3-5 Days Complete  HRI or Home Care Consult Complete  Social Work Consult for Recovery Care Planning/Counseling Complete  Palliative Care Screening Not Applicable  Medication Review Oceanographer) Complete

## 2023-09-11 NOTE — Evaluation (Signed)
 Physical Therapy Evaluation Patient Details Name: Donna Sexton MRN: [id number] DOB: 1950/07/07 Today's Date: 09/11/2023  History of Present Illness  73 yo female presents to therapy following hospital admission on 09/05/2023 from [facility] rehab facility secondary to hypotension and lethargy. Upon arrival to ED pt hypoxic with O2 saturation in the 80s. Respiratory panel negative, CT head negative for acute findings. Pt found to have acute respiratory failure with hypoxia, volume overload attributed to [facility] and suspect UTI. Pt PMH includes but is not limited to: COVID, anemia, anxiety, bioplar disorder, migraine, asthma, GERD, IBS, HLD, HTN, OP, OSA, UTI, PAF, dCHF and falls. Pt has participated with PT in hospital February, April and July of this year.  Clinical Impression    Pt admitted with above diagnosis.  Pt currently with functional limitations due to the deficits listed below (see PT Problem List). Pt in bed when PT arrived. Pt required cues for encouragement for participation reporting that it has already been a morning and she is not feeling well. Pt is concerned about the change in ABX and finding a different SNF for rehab. Pt son and ex-husband present. Pt required mod A x 2 for supine to sit, mod A x 2 for sit to stand  from EOB, min A x 2 for SPT to recliner. Pt left in recliner, all needs in place and family present.  Patient will benefit from continued inpatient follow up therapy, <3 hours/day. Pt will benefit from acute skilled PT to increase their independence and safety with mobility to allow discharge.   >/= 96% on RA throughout intervention and no reports of SOB        If plan is discharge home, recommend the following: Two people to help with walking and/or transfers;Two people to help with bathing/dressing/bathroom;Assistance with cooking/housework;Assist for transportation;Help with stairs or ramp for entrance   Can travel by private vehicle   No    Equipment  Recommendations None recommended by PT  Recommendations for Other Services       Functional Status Assessment       Precautions / Restrictions Precautions Precautions: Fall Recall of Precautions/Restrictions: Impaired Restrictions Weight Bearing Restrictions Per Provider Order: No      Mobility  Bed Mobility Overal bed mobility: Needs Assistance Bed Mobility: Supine to Sit, Sit to Supine     Supine to sit: Mod assist, +2 for physical assistance     General bed mobility comments: pt able to engage with supine to sit with LE movement toward EOB, use of bed rail and HOB elevated with increased time and mod A x 2 requiring use of bed pad to complete transition to EOB    Transfers Overall transfer level: Needs assistance Equipment used: Rolling walker (2 wheels) Transfers: Sit to/from Stand, Bed to chair/wheelchair/BSC Sit to Stand: Mod assist, +2 physical assistance, From elevated surface Stand pivot transfers: Min assist, +2 physical assistance, +2 safety/equipment         General transfer comment: pt required cues and pull to stand from EOB to RW, pt reported not feeling well once in standing, pt able to follow directions for extension posture and required min A x 2 to complete SPT to recliner with RW and min cues, poor eccentric control to recliner    Ambulation/Gait               General Gait Details: NT  Stairs            Wheelchair Mobility     Tilt Bed  Modified Rankin (Stroke Patients Only)       Balance Overall balance assessment: Needs assistance, History of Falls Sitting-balance support: Feet supported, No upper extremity supported Sitting balance-Leahy Scale: Fair Sitting balance - Comments: pt able to maintain midline and no overt LOB   Standing balance support: Bilateral upper extremity supported Standing balance-Leahy Scale: Fair Standing balance comment: pt able to maintain static standing at RW with cues for extension posture  and min A                             Pertinent Vitals/Pain Pain Assessment Pain Assessment: No/denies pain    Home Living                          Prior Function                       Extremity/Trunk Assessment                Communication   Communication Communication: No apparent difficulties    Cognition Arousal: Alert Behavior During Therapy: WFL for tasks assessed/performed   PT - Cognitive impairments: No apparent impairments                       PT - Cognition Comments: son and ex-husband present during therapy session and pt more alert and engaged then at [employer] Following commands: Intact       Cueing Cueing Techniques: Verbal cues, Gestural cues, Tactile cues, Visual cues     General Comments      Exercises General Exercises - Lower Extremity Ankle Circles/Pumps: AROM, Both, 10 reps (pt encouraged to perfrom ankle pumps while seated in recliner)   Assessment/Plan    PT Assessment    PT Problem List         PT Treatment Interventions      PT Goals (Current goals can be found in the Care Plan section)  Acute Rehab PT Goals Patient Stated Goal: feel better PT Goal Formulation: With patient Time For Goal Achievement: 09/20/23 Potential to Achieve Goals: Fair    Frequency Min 2X/week     Co-evaluation               AM-PAC PT 6 Clicks Mobility  Outcome Measure Help needed turning from your back to your side while in a flat bed without using bedrails?: A Lot Help needed moving from lying on your back to sitting on the side of a flat bed without using bedrails?: A Lot Help needed moving to and from a bed to a chair (including a wheelchair)?: A Lot Help needed standing up from a chair using your arms (e.g., wheelchair or bedside chair)?: A Lot Help needed to walk in hospital room?: Total Help needed climbing 3-5 steps with a railing? : Total 6 Click Score: 10    End of Session Equipment  Utilized During Treatment: Gait belt Activity Tolerance: Patient limited by fatigue (feeling nauseated) Patient left: with call bell/phone within reach;with family/visitor present;in chair Nurse Communication: Mobility status PT Visit Diagnosis: Other abnormalities of gait and mobility (R26.89);Unsteadiness on feet (R26.81);Muscle weakness (generalized) (M62.81);History of falling (Z91.81);Difficulty in walking, not elsewhere classified (R26.2)    Time: 8890-8865 PT Time Calculation (min) (ACUTE ONLY): 25 min   Charges:     PT Treatments $Therapeutic Activity: 23-37 mins PT General Charges $$ ACUTE PT VISIT: 1  Visit         Donna Sexton, PT Acute Rehab   Donna Sexton VEAR Drone 09/11/2023, 12:57 PM

## 2023-09-12 ENCOUNTER — Encounter: Payer: Self-pay | Admitting: Sports Medicine

## 2023-09-12 DIAGNOSIS — J9601 Acute respiratory failure with hypoxia: Secondary | ICD-10-CM | POA: Diagnosis not present

## 2023-09-12 MED ORDER — FAMOTIDINE 20 MG PO TABS: 20.0000 mg | ORAL_TABLET | Freq: Every day | ORAL | Status: DC

## 2023-09-12 MED ORDER — NITROFURANTOIN MONOHYD MACRO 100 MG PO CAPS: 100.0000 mg | ORAL_CAPSULE | Freq: Every day | ORAL | Status: DC

## 2023-09-12 MED ORDER — PREGABALIN 50 MG PO CAPS: 50.0000 mg | ORAL_CAPSULE | Freq: Two times a day (BID) | ORAL | 0 refills | Status: DC

## 2023-09-12 MED ORDER — HYDROCODONE-ACETAMINOPHEN 5-325 MG PO TABS: 1.0000 | ORAL_TABLET | Freq: Four times a day (QID) | ORAL | 0 refills | Status: DC | PRN

## 2023-09-12 NOTE — Progress Notes (Signed)
 PROGRESS NOTE    Donna Sexton  FMW:[id number] DOB: 1950-10-12 DOA: 09/05/2023 PCP: Alvan Dorothyann BIRCH, MD    Brief Narrative:  Patient is 73 year old female with history of chronic hypoxemic failure due to COVID-19 infection, anxiety, bipolar disorder, GERD, hyperlipidemia and hypertension, recurrent UTI, paroxysmal A-fib and chronic diastolic dysfunction who is currently at a skilled nursing rehab for the last 5 weeks sent to the emergency room with hypotension and lethargy.  When EMS arrived, she was found to be hypoxemic in the 80s.  Patient was complaining of orthopnea.  Leg swelling.  Initially no reporting of fever or signs of infection.  At the emergency room she was started on oxygen, given Lasix .  White cell count 9.8.  COVID, influenza and RSV negative.  BNP was 511.  Chest x-ray showed cardiomegaly with retrocardiac atelectasis.  CT head without any acute abnormality. After admission, overnight patient started having low-grade fever and profound lethargy.  Patient was found to have a UTI due to multiple organisms. 9/1, clinically improved.  Waiting for SNF bed.  Subjective:  Patient seen and examined.  Denies any complaints today.  Pleasant interactive.  Afebrile.  Eating adequate.  Waiting to go to SNF. She loves to use the pure wick, discussed about discontinuing this and getting to the bedside commode.  Assessment & Plan:   Acute respiratory failure with hypoxemia, fluid overload likely secondary to diastolic congestive heart failure: Diuretics were held secondary to clinical dehydration and acute infection.  Started back on Lasix  40 mg daily with improvement.  Continue beta-blockers. Recent echocardiogram with normal ejection fraction, has diastolic dysfunction.Input output monitoring.   Now on room air. Avoid aggressive diuresis.  Acute UTI, present on admission causing acute metabolic encephalopathy. Blood cultures drawn, negative so far. Urine culture with  Pseudomonas and Enterococcus faecalis.  Received IV Zosyn  for 4 day-fosfomycin 3 g.  Clinically improved.  Patient gets recurrent debilitating UTIs, she will benefit with long-term suppressive therapy.  Will discharge patient on long-term Macrobid  therapy. CT scan of chest abdomen pelvis was essentially normal.  Continue symptomatic managemen  Acute metabolic encephalopathy: Underlying history of bipolar disorder.  Patient was very lethargic.  Normalized now.   Blood gas reviewed normal pH and normal CO2. Ammonia level normal.  Depakote  level normal.  CT head normal.  GERD: On Pepcid .  Oral Pepcid  twice daily.  Increased dose.  Hyperlipidemia: On statin.  Continued.  Essential hypertension: As above on metoprolol  and Cardizem .  Stable.  Paroxysmal A-fib: Currently sinus rhythm.  On amiodarone , Cardizem , metoprolol .  Therapeutic on Eliquis .  Disposition: SNF when bed available.  Medically stable.      DVT prophylaxis: apixaban  (ELIQUIS ) tablet 5 mg Start: 09/05/23 1400 apixaban  (ELIQUIS ) tablet 5 mg   Code Status: Full code Family Communication: At the bedside. Disposition Plan: Status is: Inpatient.  Medically stable.  Discharge when SNF bed available.  Can be MedSurg bed.     Consultants:  None  Procedures:  None  Antimicrobials:  Rocephin  8/27--- 8/28 Zosyn  8/28--- 9/1 Fosfomycin 3 g once 9/1 Nitrofurantoin  100 mg at nighttime 9/2----     Objective: Vitals:   09/12/23 0558 09/12/23 0901 09/12/23 0912 09/12/23 0948  BP:  122/78  122/78  Pulse: 88 91  91  Resp:      Temp:  98 F (36.7 C)    TempSrc:  Oral    SpO2: 94% 97% 98%   Weight:      Height:        Intake/Output  Summary (Last 24 hours) at 09/12/2023 1152 Last data filed at 09/12/2023 0250 Gross per 24 hour  Intake 640 ml  Output 450 ml  Net 190 ml   Filed Weights   09/10/23 0500 09/11/23 0500 09/12/23 0500  Weight: 107.1 kg 105.9 kg 104.9 kg    Examination:  General exam: Pleasant  interactive and comfortable today.  On room air. Respiratory system: Clear to auscultation. Respiratory effort normal.  No added sounds. Cardiovascular system: S1 & S2 heard, RRR.  Chronically edematous legs, lymphedema present.  Mostly chronic nonpitting edema. Gastrointestinal system: Abdomen is nondistended, soft and nontender. No organomegaly or masses felt. Normal bowel sounds heard.  Obese and pendulous. Central nervous system: Alert awake and oriented.  Gross generalized weakness..     Data Reviewed: I have personally reviewed following labs and imaging studies  CBC: Recent Labs  Lab 09/06/23 0532 09/07/23 0508 09/10/23 0543  WBC 9.1 7.9 3.7*  NEUTROABS  --  5.2 1.7  HGB 10.4* 9.8* 8.9*  HCT 31.9* 30.7* 27.4*  MCV 103.9* 104.1* 101.9*  PLT 153 146* 167   Basic Metabolic Panel: Recent Labs  Lab 09/06/23 0532 09/07/23 0508 09/10/23 0543  NA 136 137 137  K 3.5 3.4* 3.5  CL 97* 99 99  CO2 26 26 28   GLUCOSE 112* 95 96  BUN 13 17 12   CREATININE 0.68 0.68 0.53  CALCIUM  9.1 8.6* 9.0  MG  --  1.9 2.0  PHOS  --  3.5 3.2   GFR: Estimated Creatinine Clearance: 72.9 mL/min (by C-G formula based on SCr of 0.53 mg/dL). Liver Function Tests: Recent Labs  Lab 09/06/23 0532 09/07/23 0508 09/10/23 0543  AST 12* <10* 25  ALT 9 7 13   ALKPHOS 50 45 49  BILITOT 1.3* 0.7 0.3  PROT 5.3* 5.0* 5.1*  ALBUMIN 3.2* 2.8* 2.9*   No results for input(s): LIPASE, AMYLASE in the last 168 hours. No results for input(s): AMMONIA in the last 168 hours.  Coagulation Profile: No results for input(s): INR, PROTIME in the last 168 hours. Cardiac Enzymes: No results for input(s): CKTOTAL, CKMB, CKMBINDEX, TROPONINI in the last 168 hours. BNP (last 3 results) No results for input(s): PROBNP in the last 8760 hours. HbA1C: No results for input(s): HGBA1C in the last 72 hours. CBG: No results for input(s): GLUCAP in the last 168 hours.  Lipid Profile: No results  for input(s): CHOL, HDL, LDLCALC, TRIG, CHOLHDL, LDLDIRECT in the last 72 hours. Thyroid  Function Tests: No results for input(s): TSH, T4TOTAL, FREET4, T3FREE, THYROIDAB in the last 72 hours. Anemia Panel: No results for input(s): VITAMINB12, FOLATE, FERRITIN, TIBC, IRON, RETICCTPCT in the last 72 hours.  Sepsis Labs: Recent Labs  Lab 09/06/23 0817  PROCALCITON 0.14    Recent Results (from the past 240 hours)  Resp panel by RT-PCR (RSV, Flu A&B, Covid) Anterior Nasal Swab     Status: None   Collection Time: 09/05/23  6:50 AM   Specimen: Anterior Nasal Swab  Result Value Ref Range Status   SARS Coronavirus 2 by RT PCR NEGATIVE NEGATIVE Final    Comment: (NOTE) SARS-CoV-2 target nucleic acids are NOT DETECTED.  The SARS-CoV-2 RNA is generally detectable in upper respiratory specimens during the acute phase of infection. The lowest concentration of SARS-CoV-2 viral copies this assay can detect is 138 copies/mL. A negative result does not preclude SARS-Cov-2 infection and should not be used as the sole basis for treatment or other patient management decisions. A negative result may occur  with  improper specimen collection/handling, submission of specimen other than nasopharyngeal swab, presence of viral mutation(s) within the areas targeted by this assay, and inadequate number of viral copies(<138 copies/mL). A negative result must be combined with clinical observations, patient history, and epidemiological information. The expected result is Negative.  Fact Sheet for Patients:  BloggerCourse.com  Fact Sheet for Healthcare Providers:  SeriousBroker.it  This test is no t yet approved or cleared by the [place]  FDA and  has been authorized for detection and/or diagnosis of SARS-CoV-2 by FDA under an Emergency Use Authorization (EUA). This EUA will remain  in effect (meaning this test can be  used) for the duration of the COVID-19 declaration under Section 564(b)(1) of the Act, 21 U.S.C.section 360bbb-3(b)(1), unless the authorization is terminated  or revoked sooner.       Influenza A by PCR NEGATIVE NEGATIVE Final   Influenza B by PCR NEGATIVE NEGATIVE Final    Comment: (NOTE) The Xpert Xpress SARS-CoV-2/FLU/RSV plus assay is intended as an aid in the diagnosis of influenza from Nasopharyngeal swab specimens and should not be used as a sole basis for treatment. Nasal washings and aspirates are unacceptable for Xpert Xpress SARS-CoV-2/FLU/RSV testing.  Fact Sheet for Patients: BloggerCourse.com  Fact Sheet for Healthcare Providers: SeriousBroker.it  This test is not yet approved or cleared by the [place]  FDA and has been authorized for detection and/or diagnosis of SARS-CoV-2 by FDA under an Emergency Use Authorization (EUA). This EUA will remain in effect (meaning this test can be used) for the duration of the COVID-19 declaration under Section 564(b)(1) of the Act, 21 U.S.C. section 360bbb-3(b)(1), unless the authorization is terminated or revoked.     Resp Syncytial Virus by PCR NEGATIVE NEGATIVE Final    Comment: (NOTE) Fact Sheet for Patients: BloggerCourse.com  Fact Sheet for Healthcare Providers: SeriousBroker.it  This test is not yet approved or cleared by the [place]  FDA and has been authorized for detection and/or diagnosis of SARS-CoV-2 by FDA under an Emergency Use Authorization (EUA). This EUA will remain in effect (meaning this test can be used) for the duration of the COVID-19 declaration under Section 564(b)(1) of the Act, 21 U.S.C. section 360bbb-3(b)(1), unless the authorization is terminated or revoked.  Performed at [facility], [address] [place], [place] [address]   Culture, blood (Routine X 2) w  Reflex to ID Panel     Status: None   Collection Time: 09/06/23  8:17 AM   Specimen: BLOOD  Result Value Ref Range Status   Specimen Description   Final    BLOOD BLOOD RIGHT ARM Performed at [facility], [address], [place], [place] [address]    Special Requests   Final    BOTTLES DRAWN AEROBIC AND ANAEROBIC Blood Culture adequate volume Performed at [facility], [address] [place], [place] [address]    Culture   Final    NO GROWTH 5 DAYS Performed at [facility], [address], [place], [place] [address]    Report Status 09/11/2023 FINAL  Final  Culture, blood (Routine X 2) w Reflex to ID Panel     Status: None   Collection Time: 09/06/23  8:17 AM   Specimen: BLOOD  Result Value Ref Range Status   Specimen Description   Final    BLOOD BLOOD RIGHT HAND Performed at [facility], [address], [place], [place] [address]    Special Requests  Final    BOTTLES DRAWN AEROBIC AND ANAEROBIC Blood Culture adequate volume Performed at [facility], [address], [place], [place] [address]    Culture   Final    NO GROWTH 5 DAYS Performed at [facility], [address] [place], [place] [address]    Report Status 09/11/2023 FINAL  Final  Urine Culture (for pregnant, neutropenic or urologic patients or patients with an indwelling urinary catheter)     Status: Abnormal   Collection Time: 09/06/23 12:20 PM   Specimen: Urine, Clean Catch  Result Value Ref Range Status   Specimen Description   Final    URINE, CLEAN CATCH Performed at [facility] [place] [facility], [address], [place], [place] [address]    Special Requests   Final    NONE Performed at [facility], [address] [place], [place] [address]    Culture (A)  Final    >=100,000 COLONIES/mL PSEUDOMONAS AERUGINOSA >=100,000 COLONIES/mL ENTEROCOCCUS FAECALIS    Report Status 09/09/2023 FINAL  Final   Organism ID,  Bacteria PSEUDOMONAS AERUGINOSA (A)  Final   Organism ID, Bacteria ENTEROCOCCUS FAECALIS (A)  Final      Susceptibility   Enterococcus faecalis - MIC*    AMPICILLIN <=2 SENSITIVE Sensitive     NITROFURANTOIN  <=16 SENSITIVE Sensitive     VANCOMYCIN  2 SENSITIVE Sensitive     * >=100,000 COLONIES/mL ENTEROCOCCUS FAECALIS   Pseudomonas aeruginosa - MIC*    MEROPENEM 1 SENSITIVE Sensitive     CIPROFLOXACIN 0.5 SENSITIVE Sensitive     IMIPENEM 2 SENSITIVE Sensitive     PIP/TAZO Value in next row Sensitive ug/mL     16 SENSITIVEThis is a modified FDA-approved test that has been validated and its performance characteristics determined by the reporting laboratory.  This laboratory is certified under the Clinical Laboratory Improvement Amendments CLIA as qualified to perform high complexity clinical laboratory testing.    CEFTAZIDIME/AVIBACTAM Value in next row Sensitive ug/mL     16 SENSITIVEThis is a modified FDA-approved test that has been validated and its performance characteristics determined by the reporting laboratory.  This laboratory is certified under the Clinical Laboratory Improvement Amendments CLIA as qualified to perform high complexity clinical laboratory testing.    CEFTOLOZANE/TAZOBACTAM Value in next row Resistant ug/mL     16 SENSITIVEThis is a modified FDA-approved test that has been validated and its performance characteristics determined by the reporting laboratory.  This laboratory is certified under the Clinical Laboratory Improvement Amendments CLIA as qualified to perform high complexity clinical laboratory testing.    TOBRAMYCIN Value in next row Sensitive      16 SENSITIVEThis is a modified FDA-approved test that has been validated and its performance characteristics determined by the reporting laboratory.  This laboratory is certified under the Clinical Laboratory Improvement Amendments CLIA as qualified to perform high complexity clinical laboratory testing.    CEFTAZIDIME  Value in next row Sensitive      16 SENSITIVEThis is a modified FDA-approved test that has been validated and its performance characteristics determined by the reporting laboratory.  This laboratory is certified under the Clinical Laboratory Improvement Amendments CLIA as qualified to perform high complexity clinical laboratory testing.    * >=100,000 COLONIES/mL PSEUDOMONAS AERUGINOSA         Radiology Studies: No results found.        Scheduled Meds:  amiodarone   100 mg Oral Daily   apixaban   5 mg Oral BID   atorvastatin   40 mg Oral Daily   diltiazem   120 mg Oral Daily   divalproex   1,500 mg Oral QHS   famotidine   20 mg Oral BID   fluticasone  furoate-vilanterol  1 puff Inhalation Daily   furosemide   40 mg Oral Daily   metoprolol  tartrate  25 mg Oral BID   mirabegron  ER  25 mg Oral QHS   nitrofurantoin  (macrocrystal-monohydrate)  100 mg Oral QHS   polyethylene glycol  17 g Oral Daily   potassium chloride  SA  20 mEq Oral Daily   senna-docusate  2 tablet Oral QHS   Continuous Infusions:     LOS: 6 days    Time spent: 40 minutes   Renato Applebaum, MD [facility]

## 2023-09-12 NOTE — Progress Notes (Signed)
 Patient received discharge orders to go to [facility]. RN called report over to [facility]. AVS placed in patient's discharge paperwork packet. PTAR came to pick patient up to take patient to [facility], and discharge paperwork packet was provided to PTAR to take to facility. Patient left the hospital stable and had all personal belongings. Patient notified son upon PTAR's arrival to be taken to [facility].

## 2023-09-12 NOTE — Progress Notes (Signed)
 Occupational Therapy Treatment Patient Details Name: Donna Sexton MRN: [id number] DOB: 1950/02/21 Today's Date: 09/12/2023   History of present illness 73 yo female presents to therapy following hospital admission on 09/05/2023 from [facility] rehab facility secondary to hypotension and lethargy. Upon arrival to ED pt hypoxic with O2 saturation in the 80s. Respiratory panel negative, CT head negative for acute findings. Pt found to have acute respiratory failure with hypoxia, volume overload attributed to [place] and suspect UTI. Pt PMH includes but is not limited to: COVID, anemia, anxiety, bioplar disorder, migraine, asthma, GERD, IBS, HLD, HTN, OP, OSA, UTI, PAF, dCHF and falls. Pt has participated with PT in hospital February, April and July of this year.   OT comments  Pt making progress towards goals. Seen by request of NT for transfer assistance. Pt received with NT on BSC, requires MAX A +1 for partial STS using stedy device, pt unable to stand fully upright for device supports to be lowered, NT performing pericare with MAX A from lateral leans on BSC. Second attempt with MAX A +2 to rise from [facility] for transferring back to bed. Pt demos poor tolerance to standing, generalized weakness and impaired ADL performance. OT will continue to follow, per chart, pt set to discharge later today to [facility].       If plan is discharge home, recommend the following:  Two people to help with walking and/or transfers;Two people to help with bathing/dressing/bathroom;Assistance with feeding;Direct supervision/assist for medications management;Direct supervision/assist for financial management;Assist for transportation;Help with stairs or ramp for entrance;Supervision due to cognitive status   Equipment Recommendations  None recommended by OT       Precautions / Restrictions Precautions Precautions: Fall Recall of Precautions/Restrictions: Impaired Restrictions Weight Bearing Restrictions Per Provider Order:  No       Mobility Bed Mobility Overal bed mobility: Needs Assistance         Sit to supine: Mod assist, +2 for physical assistance        Transfers Overall transfer level: Needs assistance Equipment used: Ambulation equipment used Transfers: Sit to/from Stand, Bed to chair/wheelchair/BSC Sit to Stand: From elevated surface, Max assist, +2 physical assistance             Transfer via Lift Equipment: Stedy   Balance Overall balance assessment: Needs assistance, History of Falls Sitting-balance support: Feet supported, No upper extremity supported Sitting balance-Leahy Scale: Fair Sitting balance - Comments: pt able to maintain midline and no overt LOB   Standing balance support: Bilateral upper extremity supported Standing balance-Leahy Scale: Fair Standing balance comment: poor standing tolerance, difficulties achieving upright tolerance                           ADL either performed or assessed with clinical judgement   ADL Overall ADL's : Needs assistance/impaired                         Toilet Transfer: BSC/3in1;Maximal assistance;+2 for physical assistance;+2 for safety/equipment;Cueing for safety;Cueing for sequencing (stedy) Toilet Transfer Details (indicate cue type and reason): pt recieved sitting on BSC with NT; requires MAX A +1 for first attempt to perform STS in stedy; unable to achieve posterior clearance for both flaps to be lowered, second attempt with MAX A +2 successful Toileting- Clothing Manipulation and Hygiene: Maximal assistance;Sit to/from stand Toileting - Clothing Manipulation Details (indicate cue type and reason): maxA from lateral leans, performed by NT with OT  providing assist for partial stand from [facility]     Functional mobility during ADLs: Maximal assistance;+2 for physical assistance General ADL Comments: assisted NT with stedy transfer and STS x2    Extremity/Trunk Assessment                        Communication Communication Communication: No apparent difficulties   Cognition Arousal: Alert Behavior During Therapy: WFL for tasks assessed/performed                                 Following commands: Intact        Cueing   Cueing Techniques: Verbal cues, Gestural cues, Tactile cues, Visual cues             Pertinent Vitals/ Pain       Pain Assessment Pain Assessment: Faces Faces Pain Scale: Hurts a little bit Pain Location: L hip, knees, generalized Pain Descriptors / Indicators: Grimacing Pain Intervention(s): Limited activity within patient's tolerance   Frequency  Min 2X/week        Progress Toward Goals  OT Goals(current goals can now be found in the care plan section)  Progress towards OT goals: Progressing toward goals  Acute Rehab OT Goals OT Goal Formulation: With patient Time For Goal Achievement: 09/20/23 Potential to Achieve Goals: Fair ADL Goals Pt Will Perform Grooming: with contact guard assist;sitting Pt Will Perform Upper Body Bathing: sitting;with contact guard assist Pt Will Transfer to Toilet: stand pivot transfer;bedside commode;with mod assist Pt Will Perform Toileting - Clothing Manipulation and hygiene: with min assist;sitting/lateral leans;sit to/from stand  Plan         AM-PAC OT 6 Clicks Daily Activity     Outcome Measure   Help from another person eating meals?: A Lot Help from another person taking care of personal grooming?: A Lot Help from another person toileting, which includes using toliet, bedpan, or urinal?: Total Help from another person bathing (including washing, rinsing, drying)?: Total Help from another person to put on and taking off regular upper body clothing?: Total Help from another person to put on and taking off regular lower body clothing?: Total 6 Click Score: 8    End of Session Equipment Utilized During Treatment:  (stedy)  OT Visit Diagnosis: Muscle weakness (generalized)  (M62.81);Other abnormalities of gait and mobility (R26.89);Unsteadiness on feet (R26.81)   Activity Tolerance Patient tolerated treatment well   Patient Left in bed;with nursing/sitter in room (NT in room)   Nurse Communication Mobility status;Need for lift equipment        Time: 1414-1420 OT Time Calculation (min): 6 min  Charges: OT General Charges $OT Visit: 1 Visit  Rachyl Wuebker L. Christyna Letendre, OTR/L  09/12/23, 2:32 PM

## 2023-09-12 NOTE — TOC Transition Note (Addendum)
 Transition of Care [facility]) - Discharge Note   Patient Details  Name: Donna Sexton MRN: [id number] Date of Birth: [birth year]  [facility] CM/SW Contact:  Bascom Service, RN Phone Number: 09/12/2023, 12:12 PM   Clinical Narrative:  [facility] rep [facility] has bed available can d/c today. received auth Joshua(son) aware of auth & d/c to [facility]-in agreement. Await d/c summary,then rm,report# prior PTAR.No further CM needs.  -3:16p-Going to [facility] rm#[id number],report#[phone]. PTAR called No further CM needs.    Final next level of care: Skilled Nursing Facility Barriers to Discharge: No Barriers Identified   Patient Goals and CMS Choice Patient states their goals for this hospitalization and ongoing recovery are:: ST SNF CMS Medicare.gov Compare Post Acute Care list provided to:: Patient Represenative (must comment) (Joshua(son)) Choice offered to / list presented to : Adult Children [facility] ownership interest in [facility].provided to:: Adult Children    Discharge Placement              Patient chooses bed at: Other - please specify in the comment section below: ([facility]) Patient to be transferred to facility by: PTAR Name of family member notified: Joshua(son) Patient and family notified of of transfer: 09/12/23  Discharge Plan and Services Additional resources added to the After Visit Summary for                                       Social Drivers of Health (SDOH) Interventions SDOH Screenings   Food Insecurity: No Food Insecurity (09/05/2023)  Recent Concern: Food Insecurity - Food Insecurity Present (06/26/2023)  Housing: Low Risk  (09/05/2023)  Transportation Needs: No Transportation Needs (09/05/2023)  Utilities: Not At Risk (09/05/2023)  Alcohol Screen: Low Risk  (03/28/2022)  Depression (PHQ2-9): Medium Risk (06/26/2023)  Financial Resource Strain: Low Risk  (06/26/2023)  Physical Activity: Inactive (06/26/2023)  Social  Connections: Socially Isolated (09/05/2023)  Stress: Stress Concern Present (06/26/2023)  Tobacco Use: Low Risk  (09/05/2023)     Readmission Risk Interventions    09/09/2023   12:46 PM  Readmission Risk Prevention Plan  Transportation Screening Complete  PCP or Specialist Appt within 3-5 Days Complete  HRI or Home Care Consult Complete  Social Work Consult for Recovery Care Planning/Counseling Complete  Palliative Care Screening Not Applicable  Medication Review Oceanographer) Complete

## 2023-09-12 NOTE — Discharge Summary (Signed)
 Physician Discharge Summary  Donna Sexton FMW:[id number] DOB: [birth year] DOA: 09/05/2023  PCP: Alvan Dorothyann BIRCH, MD  Admit date: 09/05/2023 Discharge date: 09/12/2023  Admitted From: Skilled nursing facility Disposition: Skilled nursing facility  Recommendations for Outpatient Follow-up:  Follow up with PCP in 1-2 weeks Please obtain BMP/CBC in one week   Discharge Condition: Stable CODE STATUS: Full code Diet recommendation: Low-salt diet  Discharge summary: Patient is 73 year old female with history of chronic hypoxemic failure due to COVID-19 infection, anxiety, bipolar disorder, GERD, hyperlipidemia and hypertension, recurrent UTI, paroxysmal A-fib and chronic diastolic dysfunction who is currently at a skilled nursing rehab sent to the emergency room with hypotension and lethargy.  When EMS arrived, she was found to be hypoxemic in the 80s.  Patient was complaining of orthopnea.  Leg swelling.  Initially no reporting of fever or signs of infection.  At the emergency room,  she was started on oxygen, given Lasix .  White cell count 9.8.  COVID, influenza and RSV negative.  BNP was 511.  Chest x-ray showed cardiomegaly with retrocardiac atelectasis.  CT head without any acute abnormality. After admission, overnight patient started having low-grade fever and profound lethargy.  Patient was found to have a UTI due to multiple organisms. 9/1, clinically improved.  Needs to go back to skilled nursing facility.  Patient was treated for following conditions.  Acute respiratory failure with hypoxemia, fluid overload likely secondary to diastolic congestive heart failure: Diuretics were held secondary to clinical dehydration and acute infection.  Started back on Lasix  40 mg daily with improvement.  Increasing to home dose of 80 mg daily.  Continue potassium supplementation.  Continue beta-blockers. Recent echocardiogram with normal ejection fraction, has diastolic dysfunction.Input output  monitoring.   Now on room air.   Acute UTI, present on admission causing acute metabolic encephalopathy. Blood cultures negative.   Urine culture with Pseudomonas and Enterococcus faecalis.  Received IV Zosyn  for 4 day-fosfomycin 3 g.  Clinically improved.  Patient gets recurrent debilitating UTIs, she will benefit with long-term suppressive therapy.  Will discharge patient on long-term Macrobid  therapy. CT scan of chest abdomen pelvis was essentially normal.  Continue symptomatic managemen   Acute metabolic encephalopathy: Underlying history of bipolar disorder.  Patient was very lethargic.  Normalized now.   Blood gas reviewed normal pH and normal CO2. Ammonia level normal.  Depakote  level normal.  CT head normal.   GERD: On Pepcid .  Oral Pepcid  twice daily.  Increased dose.   Hyperlipidemia: On statin.  Continued.   Essential hypertension: As above on metoprolol  and Cardizem .  Stable.   Paroxysmal A-fib: Currently sinus rhythm.  On amiodarone , Cardizem , metoprolol .  Therapeutic on Eliquis .  Chronic pain syndrome: Patient is on Tylenol , Lyrica  as well Norco.  Prescribed.  Stable to transition to skilled nursing facility.   Discharge Diagnoses:  Principal Problem:   Acute respiratory failure with hypoxia (HCC) Active Problems:   GERD   Hyperlipidemia   Essential hypertension   Paroxysmal atrial fibrillation (HCC)   Class 3 obesity   Generalized weakness   Acute metabolic encephalopathy   Acute on chronic diastolic CHF (congestive heart failure) (HCC)   Macrocytic anemia    Discharge Instructions  Discharge Instructions     Diet - low sodium heart healthy   Complete by: As directed    Increase activity slowly   Complete by: As directed    No wound care   Complete by: As directed       Allergies as of 09/12/2023  Reactions   Morphine Nausea And Vomiting        Medication List     STOP taking these medications    aluminum-magnesium  hydroxide  200-200 MG/5ML suspension   cefTRIAXone  1 g injection Commonly known as: ROCEPHIN    cephALEXin  500 MG capsule Commonly known as: KEFLEX    oxybutynin  5 MG tablet Commonly known as: DITROPAN    promethazine  25 MG tablet Commonly known as: PHENERGAN        TAKE these medications    acetaminophen  500 MG tablet Commonly known as: TYLENOL  Take 1 tablet (500 mg total) by mouth every 6 (six) hours as needed for mild pain (pain score 1-3) (or Fever >/= 101). What changed:  how much to take when to take this   amiodarone  100 MG tablet Commonly known as: PACERONE  Take 100 mg by mouth daily.   apixaban  5 MG Tabs tablet Commonly known as: ELIQUIS  Take 1 tablet (5 mg total) by mouth 2 (two) times daily.   atorvastatin  40 MG tablet Commonly known as: LIPITOR Take 1 tablet (40 mg total) by mouth daily.   bisacodyl  10 MG suppository Commonly known as: DULCOLAX Place 10 mg rectally daily as needed for moderate constipation.   diltiazem  120 MG 24 hr capsule Commonly known as: CARDIZEM  CD Take 120 mg by mouth daily.   divalproex  500 MG 24 hr tablet Commonly known as: DEPAKOTE  ER Take 3 tablets (1,500 mg total) by mouth at bedtime.   famotidine  20 MG tablet Commonly known as: PEPCID  Take 1 tablet (20 mg total) by mouth daily.   furosemide  40 MG tablet Commonly known as: Lasix  Take 1 tablet (40 mg total) by mouth daily. What changed: how much to take   HYDROcodone -acetaminophen  5-325 MG tablet Commonly known as: NORCO/VICODIN Take 1 tablet by mouth every 6 (six) hours as needed for moderate pain (pain score 4-6). What changed:  how much to take when to take this reasons to take this   levalbuterol  0.63 MG/3ML nebulizer solution Commonly known as: XOPENEX  Take 3 mLs (0.63 mg total) by nebulization every 6 (six) hours as needed for wheezing or shortness of breath.   metoprolol  tartrate 25 MG tablet Commonly known as: LOPRESSOR  Take 1 tablet (25 mg total) by mouth 2  (two) times daily.   mometasone -formoterol  100-5 MCG/ACT Aero Commonly known as: DULERA  Inhale 2 puffs into the lungs 2 (two) times daily.   Myrbetriq  25 MG Tb24 tablet Generic drug: mirabegron  ER Take 25 mg by mouth at bedtime.   nitrofurantoin  (macrocrystal-monohydrate) 100 MG capsule Commonly known as: MACROBID  Take 1 capsule (100 mg total) by mouth at bedtime.   ondansetron  4 MG tablet Commonly known as: ZOFRAN  Take 1 tablet (4 mg total) by mouth every 6 (six) hours as needed for nausea.   polyethylene glycol 17 g packet Commonly known as: MIRALAX  / GLYCOLAX  Take 17 g by mouth daily as needed for moderate constipation. What changed: when to take this   potassium chloride  SA 20 MEQ tablet Commonly known as: KLOR-CON  M Take 1 tablet (20 mEq total) by mouth daily.   pregabalin  50 MG capsule Commonly known as: LYRICA  Take 1 capsule (50 mg total) by mouth 2 (two) times daily for 5 days. What changed: when to take this   senna-docusate 8.6-50 MG tablet Commonly known as: Senokot-S Take 2 tablets by mouth at bedtime.   traZODone  50 MG tablet Commonly known as: DESYREL  Take 50 mg by mouth at bedtime as needed for sleep.  Contact information for follow-up providers     Connect with your PCP/Specialist as discussed. Schedule an appointment as soon as possible for a visit .   Contact information: [email or web address] Call our physician referral line at [phone].             Contact information for after-discharge care     Destination     [facility] .   Service: Skilled Nursing Contact information: [address] [place]  [address] [phone]                    Allergies  Allergen Reactions   Morphine Nausea And Vomiting    Consultations: None   Procedures/Studies: CT CHEST ABDOMEN PELVIS W CONTRAST Result Date: 09/07/2023 CLINICAL DATA:  Diffuse abdominal pain.  Sepsis.  Fever  EXAM: CT CHEST, ABDOMEN, AND PELVIS WITH CONTRAST TECHNIQUE: Multidetector CT imaging of the chest, abdomen and pelvis was performed following the standard protocol during bolus administration of intravenous contrast. RADIATION DOSE REDUCTION: This exam was performed according to the departmental dose-optimization program which includes automated exposure control, adjustment of the mA and/or kV according to patient size and/or use of iterative reconstruction technique. CONTRAST:  100mL OMNIPAQUE  IOHEXOL  300 MG/ML  SOLN COMPARISON:  None FINDINGS: CT CHEST FINDINGS Cardiovascular: No significant vascular findings. Normal heart size. No pericardial effusion. Mediastinum/Nodes: No axillary or supraclavicular adenopathy. No mediastinal or hilar adenopathy. No pericardial fluid. Esophagus normal. Lungs/Pleura: Moderate LEFT effusion. There is passive atelectasis of the LEFT lower lobe posterior lingula. No evidence of pneumonia. Small RIGHT effusion. Musculoskeletal: No aggressive osseous lesion. CT ABDOMEN AND PELVIS FINDINGS Hepatobiliary: Hypodensity in the central liver simple fluid attenuation most consistent benign cysts. Postcholecystectomy. No biliary duct dilatation. Pancreas: Pancreas is normal. No ductal dilatation. No pancreatic inflammation. Spleen: Normal spleen Adrenals/urinary tract: Adrenal glands and kidneys are normal. The ureters and bladder normal. Stomach/Bowel: Stomach, small bowel, appendix, and cecum are normal. The colon and rectosigmoid colon are normal. Vascular/Lymphatic: Abdominal aorta is normal caliber with atherosclerotic calcification. There is no retroperitoneal or periportal lymphadenopathy. No pelvic lymphadenopathy. Reproductive: Post hysterectomy.  Adnexa unremarkable Other: No free fluid or abscess. Musculoskeletal: No aggressive osseous lesion. IMPRESSION: CHEST: 1. Moderate LEFT effusion and small RIGHT effusion. 2. Passive atelectasis of the LEFT lower lobe and lingula. 3. No  evidence of pneumonia. PELVIS: 1. No acute findings in the abdomen pelvis. 2. No evidence of infection. 3.  Aortic Atherosclerosis (ICD10-I70.0). Electronically Signed   By: Jackquline Boxer M.D.   On: 09/07/2023 14:59   CT Head Wo Contrast Result Date: 09/05/2023 EXAM: CT HEAD WITHOUT CONTRAST 09/05/2023 08:34:01 AM TECHNIQUE: CT of the head was performed without the administration of intravenous contrast. Automated exposure control, iterative reconstruction, and/or weight based adjustment of the mA/kV was utilized to reduce the radiation dose to as low as reasonably achievable. COMPARISON: CT of the head dated 07/23/2023. CLINICAL HISTORY: Mental status change, unknown cause. Lethargic. FINDINGS: BRAIN AND VENTRICLES: No acute hemorrhage. Gray-white differentiation is preserved. No hydrocephalus. No extra-axial collection. No mass effect or midline shift. Age-related atrophy and mild periventricular white matter disease. ORBITS: The patient is status post bilateral lens replacement. SINUSES: No acute abnormality. SOFT TISSUES AND SKULL: No acute soft tissue abnormality. No skull fracture. There are calcifications within the carotid siphons. IMPRESSION: 1. No acute intracranial abnormality. 2. Age-related atrophy and mild periventricular white matter disease. Electronically signed by: Evalene Coho MD 09/05/2023 08:59 AM EDT RP Workstation: [id number]   DG Chest Portable 1 View  Result Date: 09/05/2023 CLINICAL DATA:  Shortness of breath EXAM: PORTABLE CHEST 1 VIEW COMPARISON:  07/23/2023 FINDINGS: Cardiomegaly with retrocardiac atelectasis. Pulmonary vascular prominence without overt edema. The visualized skeletal structures are unremarkable. IMPRESSION: Cardiomegaly with retrocardiac atelectasis. Pulmonary vascular prominence without overt edema. Electronically Signed   By: Marolyn JONETTA Jaksch M.D.   On: 09/05/2023 07:30   (Echo, Carotid, EGD, Colonoscopy, ERCP)    Subjective: Patient seen and examined in  the morning rounds.  Denies any complaints.  No overnight events.  Alert awake and interactive.   Discharge Exam: Vitals:   09/12/23 0948 09/12/23 1301  BP: 122/78 (!) 121/98  Pulse: 91 98  Resp:  14  Temp:  97.8 F (36.6 C)  SpO2:  95%   Vitals:   09/12/23 0901 09/12/23 0912 09/12/23 0948 09/12/23 1301  BP: 122/78  122/78 (!) 121/98  Pulse: 91  91 98  Resp:    14  Temp: 98 F (36.7 C)   97.8 F (36.6 C)  TempSrc: Oral   Oral  SpO2: 97% 98%  95%  Weight:      Height:        General: Pt is alert, awake, not in acute distress.  Oriented.  Interactive. Cardiovascular: RRR, S1/S2 +, no rubs, no gallops Respiratory: CTA bilaterally, no wheezing, no rhonchi Abdominal: Soft, NT, ND, bowel sounds + Extremities:  no cyanosis, mostly nonpitting chronic edema.    The results of significant diagnostics from this hospitalization (including imaging, microbiology, ancillary and laboratory) are listed below for reference.     Microbiology: Recent Results (from the past 240 hours)  Resp panel by RT-PCR (RSV, Flu A&B, Covid) Anterior Nasal Swab     Status: None   Collection Time: 09/05/23  6:50 AM   Specimen: Anterior Nasal Swab  Result Value Ref Range Status   SARS Coronavirus 2 by RT PCR NEGATIVE NEGATIVE Final    Comment: (NOTE) SARS-CoV-2 target nucleic acids are NOT DETECTED.  The SARS-CoV-2 RNA is generally detectable in upper respiratory specimens during the acute phase of infection. The lowest concentration of SARS-CoV-2 viral copies this assay can detect is 138 copies/mL. A negative result does not preclude SARS-Cov-2 infection and should not be used as the sole basis for treatment or other patient management decisions. A negative result may occur with  improper specimen collection/handling, submission of specimen other than nasopharyngeal swab, presence of viral mutation(s) within the areas targeted by this assay, and inadequate number of viral copies(<138 copies/mL).  A negative result must be combined with clinical observations, patient history, and epidemiological information. The expected result is Negative.  Fact Sheet for Patients:  BloggerCourse.com  Fact Sheet for Healthcare Providers:  SeriousBroker.it  This test is no t yet approved or cleared by the [place]  FDA and  has been authorized for detection and/or diagnosis of SARS-CoV-2 by FDA under an Emergency Use Authorization (EUA). This EUA will remain  in effect (meaning this test can be used) for the duration of the COVID-19 declaration under Section 564(b)(1) of the Act, 21 U.S.C.section 360bbb-3(b)(1), unless the authorization is terminated  or revoked sooner.       Influenza A by PCR NEGATIVE NEGATIVE Final   Influenza B by PCR NEGATIVE NEGATIVE Final    Comment: (NOTE) The Xpert Xpress SARS-CoV-2/FLU/RSV plus assay is intended as an aid in the diagnosis of influenza from Nasopharyngeal swab specimens and should not be used as a sole basis for treatment. Nasal washings and aspirates are unacceptable for Xpert Xpress  SARS-CoV-2/FLU/RSV testing.  Fact Sheet for Patients: BloggerCourse.com  Fact Sheet for Healthcare Providers: SeriousBroker.it  This test is not yet approved or cleared by the [place]  FDA and has been authorized for detection and/or diagnosis of SARS-CoV-2 by FDA under an Emergency Use Authorization (EUA). This EUA will remain in effect (meaning this test can be used) for the duration of the COVID-19 declaration under Section 564(b)(1) of the Act, 21 U.S.C. section 360bbb-3(b)(1), unless the authorization is terminated or revoked.     Resp Syncytial Virus by PCR NEGATIVE NEGATIVE Final    Comment: (NOTE) Fact Sheet for Patients: BloggerCourse.com  Fact Sheet for Healthcare  Providers: SeriousBroker.it  This test is not yet approved or cleared by the [place]  FDA and has been authorized for detection and/or diagnosis of SARS-CoV-2 by FDA under an Emergency Use Authorization (EUA). This EUA will remain in effect (meaning this test can be used) for the duration of the COVID-19 declaration under Section 564(b)(1) of the Act, 21 U.S.C. section 360bbb-3(b)(1), unless the authorization is terminated or revoked.  Performed at [facility], [address], [place], [place] [address]   Culture, blood (Routine X 2) w Reflex to ID Panel     Status: None   Collection Time: 09/06/23  8:17 AM   Specimen: BLOOD  Result Value Ref Range Status   Specimen Description   Final    BLOOD BLOOD RIGHT ARM Performed at [facility], [address], [place], [place] [address]    Special Requests   Final    BOTTLES DRAWN AEROBIC AND ANAEROBIC Blood Culture adequate volume Performed at [facility], [address], [place], [place] [address]    Culture   Final    NO GROWTH 5 DAYS Performed at [facility], [address] [place], [place] [address]    Report Status 09/11/2023 FINAL  Final  Culture, blood (Routine X 2) w Reflex to ID Panel     Status: None   Collection Time: 09/06/23  8:17 AM   Specimen: BLOOD  Result Value Ref Range Status   Specimen Description   Final    BLOOD BLOOD RIGHT HAND Performed at [facility], [address] [place], [place] [address]    Special Requests   Final    BOTTLES DRAWN AEROBIC AND ANAEROBIC Blood Culture adequate volume Performed at [facility], [address], [place], [place] [address]    Culture   Final    NO GROWTH 5 DAYS Performed at [facility], [address] [place], [place] [address]    Report Status 09/11/2023 FINAL  Final  Urine Culture (for pregnant, neutropenic or urologic patients or patients with an  indwelling urinary catheter)     Status: Abnormal   Collection Time: 09/06/23 12:20 PM   Specimen: Urine, Clean Catch  Result Value Ref Range Status   Specimen Description   Final    URINE, CLEAN CATCH Performed at [facility], [address], [place], [place] [address]    Special Requests   Final    NONE Performed at [facility], [address], [place], [place] [address]    Culture (A)  Final    >=100,000 COLONIES/mL PSEUDOMONAS AERUGINOSA >=100,000 COLONIES/mL ENTEROCOCCUS FAECALIS    Report Status 09/09/2023 FINAL  Final   Organism ID, Bacteria PSEUDOMONAS AERUGINOSA (A)  Final   Organism ID, Bacteria ENTEROCOCCUS FAECALIS (A)  Final  Susceptibility   Enterococcus faecalis - MIC*    AMPICILLIN <=2 SENSITIVE Sensitive     NITROFURANTOIN  <=16 SENSITIVE Sensitive     VANCOMYCIN  2 SENSITIVE Sensitive     * >=100,000 COLONIES/mL ENTEROCOCCUS FAECALIS   Pseudomonas aeruginosa - MIC*    MEROPENEM 1 SENSITIVE Sensitive     CIPROFLOXACIN 0.5 SENSITIVE Sensitive     IMIPENEM 2 SENSITIVE Sensitive     PIP/TAZO Value in next row Sensitive ug/mL     16 SENSITIVEThis is a modified FDA-approved test that has been validated and its performance characteristics determined by the reporting laboratory.  This laboratory is certified under the Clinical Laboratory Improvement Amendments CLIA as qualified to perform high complexity clinical laboratory testing.    CEFTAZIDIME/AVIBACTAM Value in next row Sensitive ug/mL     16 SENSITIVEThis is a modified FDA-approved test that has been validated and its performance characteristics determined by the reporting laboratory.  This laboratory is certified under the Clinical Laboratory Improvement Amendments CLIA as qualified to perform high complexity clinical laboratory testing.    CEFTOLOZANE/TAZOBACTAM Value in next row Resistant ug/mL     16 SENSITIVEThis is a modified FDA-approved test that has been validated  and its performance characteristics determined by the reporting laboratory.  This laboratory is certified under the Clinical Laboratory Improvement Amendments CLIA as qualified to perform high complexity clinical laboratory testing.    TOBRAMYCIN Value in next row Sensitive      16 SENSITIVEThis is a modified FDA-approved test that has been validated and its performance characteristics determined by the reporting laboratory.  This laboratory is certified under the Clinical Laboratory Improvement Amendments CLIA as qualified to perform high complexity clinical laboratory testing.    CEFTAZIDIME Value in next row Sensitive      16 SENSITIVEThis is a modified FDA-approved test that has been validated and its performance characteristics determined by the reporting laboratory.  This laboratory is certified under the Clinical Laboratory Improvement Amendments CLIA as qualified to perform high complexity clinical laboratory testing.    * >=100,000 COLONIES/mL PSEUDOMONAS AERUGINOSA     Labs: BNP (last 3 results) Recent Labs    07/19/23 1737 07/21/23 1237 09/05/23 0653  BNP 51.2 96.8 510.9*   Basic Metabolic Panel: Recent Labs  Lab 09/06/23 0532 09/07/23 0508 09/10/23 0543  NA 136 137 137  K 3.5 3.4* 3.5  CL 97* 99 99  CO2 26 26 28   GLUCOSE 112* 95 96  BUN 13 17 12   CREATININE 0.68 0.68 0.53  CALCIUM  9.1 8.6* 9.0  MG  --  1.9 2.0  PHOS  --  3.5 3.2   Liver Function Tests: Recent Labs  Lab 09/06/23 0532 09/07/23 0508 09/10/23 0543  AST 12* <10* 25  ALT 9 7 13   ALKPHOS 50 45 49  BILITOT 1.3* 0.7 0.3  PROT 5.3* 5.0* 5.1*  ALBUMIN 3.2* 2.8* 2.9*   No results for input(s): LIPASE, AMYLASE in the last 168 hours. No results for input(s): AMMONIA in the last 168 hours. CBC: Recent Labs  Lab 09/06/23 0532 09/07/23 0508 09/10/23 0543  WBC 9.1 7.9 3.7*  NEUTROABS  --  5.2 1.7  HGB 10.4* 9.8* 8.9*  HCT 31.9* 30.7* 27.4*  MCV 103.9* 104.1* 101.9*  PLT 153 146* 167    Cardiac Enzymes: No results for input(s): CKTOTAL, CKMB, CKMBINDEX, TROPONINI in the last 168 hours. BNP: Invalid input(s): POCBNP CBG: No results for input(s): GLUCAP in the last 168 hours. D-Dimer No results for input(s): DDIMER in the last  72 hours. Hgb A1c No results for input(s): HGBA1C in the last 72 hours. Lipid Profile No results for input(s): CHOL, HDL, LDLCALC, TRIG, CHOLHDL, LDLDIRECT in the last 72 hours. Thyroid  function studies No results for input(s): TSH, T4TOTAL, T3FREE, THYROIDAB in the last 72 hours.  Invalid input(s): FREET3 Anemia work up No results for input(s): VITAMINB12, FOLATE, FERRITIN, TIBC, IRON, RETICCTPCT in the last 72 hours. Urinalysis    Component Value Date/Time   COLORURINE YELLOW 07/19/2023 1824   APPEARANCEUR HAZY (A) 07/19/2023 1824   LABSPEC 1.009 07/19/2023 1824   PHURINE 6.0 07/19/2023 1824   GLUCOSEU NEGATIVE 07/19/2023 1824   GLUCOSEU NEGATIVE 10/23/2009 1116   HGBUR MODERATE (A) 07/19/2023 1824   BILIRUBINUR NEGATIVE 07/19/2023 1824   BILIRUBINUR negative 06/26/2023 1351   BILIRUBINUR neg 07/05/2016 1113   KETONESUR NEGATIVE 07/19/2023 1824   PROTEINUR NEGATIVE 07/19/2023 1824   UROBILINOGEN 1.0 06/26/2023 1351   UROBILINOGEN 1.0 02/01/2010 1926   NITRITE NEGATIVE 07/19/2023 1824   LEUKOCYTESUR LARGE (A) 07/19/2023 1824   Sepsis Labs Recent Labs  Lab 09/06/23 0532 09/07/23 0508 09/10/23 0543  WBC 9.1 7.9 3.7*   Microbiology Recent Results (from the past 240 hours)  Resp panel by RT-PCR (RSV, Flu A&B, Covid) Anterior Nasal Swab     Status: None   Collection Time: 09/05/23  6:50 AM   Specimen: Anterior Nasal Swab  Result Value Ref Range Status   SARS Coronavirus 2 by RT PCR NEGATIVE NEGATIVE Final    Comment: (NOTE) SARS-CoV-2 target nucleic acids are NOT DETECTED.  The SARS-CoV-2 RNA is generally detectable in upper respiratory specimens during the acute phase of  infection. The lowest concentration of SARS-CoV-2 viral copies this assay can detect is 138 copies/mL. A negative result does not preclude SARS-Cov-2 infection and should not be used as the sole basis for treatment or other patient management decisions. A negative result may occur with  improper specimen collection/handling, submission of specimen other than nasopharyngeal swab, presence of viral mutation(s) within the areas targeted by this assay, and inadequate number of viral copies(<138 copies/mL). A negative result must be combined with clinical observations, patient history, and epidemiological information. The expected result is Negative.  Fact Sheet for Patients:  BloggerCourse.com  Fact Sheet for Healthcare Providers:  SeriousBroker.it  This test is no t yet approved or cleared by the [place]  FDA and  has been authorized for detection and/or diagnosis of SARS-CoV-2 by FDA under an Emergency Use Authorization (EUA). This EUA will remain  in effect (meaning this test can be used) for the duration of the COVID-19 declaration under Section 564(b)(1) of the Act, 21 U.S.C.section 360bbb-3(b)(1), unless the authorization is terminated  or revoked sooner.       Influenza A by PCR NEGATIVE NEGATIVE Final   Influenza B by PCR NEGATIVE NEGATIVE Final    Comment: (NOTE) The Xpert Xpress SARS-CoV-2/FLU/RSV plus assay is intended as an aid in the diagnosis of influenza from Nasopharyngeal swab specimens and should not be used as a sole basis for treatment. Nasal washings and aspirates are unacceptable for Xpert Xpress SARS-CoV-2/FLU/RSV testing.  Fact Sheet for Patients: BloggerCourse.com  Fact Sheet for Healthcare Providers: SeriousBroker.it  This test is not yet approved or cleared by the [place]  FDA and has been authorized for detection and/or diagnosis of SARS-CoV-2  by FDA under an Emergency Use Authorization (EUA). This EUA will remain in effect (meaning this test can be used) for the duration of the COVID-19 declaration under Section 564(b)(1) of  the Act, 21 U.S.C. section 360bbb-3(b)(1), unless the authorization is terminated or revoked.     Resp Syncytial Virus by PCR NEGATIVE NEGATIVE Final    Comment: (NOTE) Fact Sheet for Patients: BloggerCourse.com  Fact Sheet for Healthcare Providers: SeriousBroker.it  This test is not yet approved or cleared by the [place]  FDA and has been authorized for detection and/or diagnosis of SARS-CoV-2 by FDA under an Emergency Use Authorization (EUA). This EUA will remain in effect (meaning this test can be used) for the duration of the COVID-19 declaration under Section 564(b)(1) of the Act, 21 U.S.C. section 360bbb-3(b)(1), unless the authorization is terminated or revoked.  Performed at [facility], [address], [place], [place] [address]   Culture, blood (Routine X 2) w Reflex to ID Panel     Status: None   Collection Time: 09/06/23  8:17 AM   Specimen: BLOOD  Result Value Ref Range Status   Specimen Description   Final    BLOOD BLOOD RIGHT ARM Performed at [facility], [address], [place], [place] [address]    Special Requests   Final    BOTTLES DRAWN AEROBIC AND ANAEROBIC Blood Culture adequate volume Performed at [facility], [address], [place], [place] [address]    Culture   Final    NO GROWTH 5 DAYS Performed at [facility], [address] [place], [place] [address]    Report Status 09/11/2023 FINAL  Final  Culture, blood (Routine X 2) w Reflex to ID Panel     Status: None   Collection Time: 09/06/23  8:17 AM   Specimen: BLOOD  Result Value Ref Range Status   Specimen Description   Final    BLOOD BLOOD RIGHT HAND Performed at [facility], [address] [place], [place] [address]    Special Requests   Final    BOTTLES DRAWN AEROBIC AND ANAEROBIC Blood Culture adequate volume Performed at [facility], [address] [place], [place] [address]    Culture   Final    NO GROWTH 5 DAYS Performed at [facility], [address], [place], [place] [address]    Report Status 09/11/2023 FINAL  Final  Urine Culture (for pregnant, neutropenic or urologic patients or patients with an indwelling urinary catheter)     Status: Abnormal   Collection Time: 09/06/23 12:20 PM   Specimen: Urine, Clean Catch  Result Value Ref Range Status   Specimen Description   Final    URINE, CLEAN CATCH Performed at [facility], [address] [place], [place] [address]    Special Requests   Final    NONE Performed at [facility], [address], [place], [place] [address]    Culture (A)  Final    >=100,000 COLONIES/mL PSEUDOMONAS AERUGINOSA >=100,000 COLONIES/mL ENTEROCOCCUS FAECALIS    Report Status 09/09/2023 FINAL  Final   Organism ID, Bacteria PSEUDOMONAS AERUGINOSA (A)  Final   Organism ID, Bacteria ENTEROCOCCUS FAECALIS (A)  Final      Susceptibility   Enterococcus faecalis - MIC*    AMPICILLIN <=2 SENSITIVE Sensitive     NITROFURANTOIN  <=16 SENSITIVE Sensitive     VANCOMYCIN  2 SENSITIVE Sensitive     * >=100,000 COLONIES/mL ENTEROCOCCUS FAECALIS   Pseudomonas aeruginosa - MIC*    MEROPENEM 1 SENSITIVE Sensitive     CIPROFLOXACIN 0.5 SENSITIVE Sensitive     IMIPENEM 2 SENSITIVE Sensitive  PIP/TAZO Value in next row Sensitive ug/mL     16 SENSITIVEThis is a modified FDA-approved test that has been validated and its performance characteristics determined by the reporting laboratory.  This laboratory is certified under the Clinical Laboratory Improvement Amendments CLIA as qualified to perform high complexity clinical laboratory testing.    CEFTAZIDIME/AVIBACTAM Value in next row  Sensitive ug/mL     16 SENSITIVEThis is a modified FDA-approved test that has been validated and its performance characteristics determined by the reporting laboratory.  This laboratory is certified under the Clinical Laboratory Improvement Amendments CLIA as qualified to perform high complexity clinical laboratory testing.    CEFTOLOZANE/TAZOBACTAM Value in next row Resistant ug/mL     16 SENSITIVEThis is a modified FDA-approved test that has been validated and its performance characteristics determined by the reporting laboratory.  This laboratory is certified under the Clinical Laboratory Improvement Amendments CLIA as qualified to perform high complexity clinical laboratory testing.    TOBRAMYCIN Value in next row Sensitive      16 SENSITIVEThis is a modified FDA-approved test that has been validated and its performance characteristics determined by the reporting laboratory.  This laboratory is certified under the Clinical Laboratory Improvement Amendments CLIA as qualified to perform high complexity clinical laboratory testing.    CEFTAZIDIME Value in next row Sensitive      16 SENSITIVEThis is a modified FDA-approved test that has been validated and its performance characteristics determined by the reporting laboratory.  This laboratory is certified under the Clinical Laboratory Improvement Amendments CLIA as qualified to perform high complexity clinical laboratory testing.    * >=100,000 COLONIES/mL PSEUDOMONAS AERUGINOSA     Time coordinating discharge: 35 minutes  SIGNED:   Renato Applebaum, MD  [facility] 09/12/2023, 1:07 PM

## 2023-09-12 NOTE — Plan of Care (Signed)

## 2023-09-15 DIAGNOSIS — I4891 Unspecified atrial fibrillation: Secondary | ICD-10-CM | POA: Diagnosis not present

## 2023-09-15 DIAGNOSIS — E785 Hyperlipidemia, unspecified: Secondary | ICD-10-CM | POA: Diagnosis not present

## 2023-09-15 DIAGNOSIS — Z820 Family history of epilepsy and other diseases of the nervous system: Secondary | ICD-10-CM | POA: Diagnosis not present

## 2023-09-15 DIAGNOSIS — I10 Essential (primary) hypertension: Secondary | ICD-10-CM | POA: Diagnosis not present

## 2023-09-18 ENCOUNTER — Telehealth: Payer: Self-pay

## 2023-09-18 DIAGNOSIS — E785 Hyperlipidemia, unspecified: Secondary | ICD-10-CM | POA: Diagnosis not present

## 2023-09-18 DIAGNOSIS — Z820 Family history of epilepsy and other diseases of the nervous system: Secondary | ICD-10-CM | POA: Diagnosis not present

## 2023-09-18 DIAGNOSIS — I4891 Unspecified atrial fibrillation: Secondary | ICD-10-CM | POA: Diagnosis not present

## 2023-09-18 DIAGNOSIS — I10 Essential (primary) hypertension: Secondary | ICD-10-CM | POA: Diagnosis not present

## 2023-09-18 NOTE — Telephone Encounter (Signed)
 Copied from CRM [id number]. 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 She will make an appointment once discharged.  Callback Number: [phone]

## 2023-09-20 DIAGNOSIS — Z820 Family history of epilepsy and other diseases of the nervous system: Secondary | ICD-10-CM | POA: Diagnosis not present

## 2023-09-20 DIAGNOSIS — I4891 Unspecified atrial fibrillation: Secondary | ICD-10-CM | POA: Diagnosis not present

## 2023-09-20 DIAGNOSIS — I10 Essential (primary) hypertension: Secondary | ICD-10-CM | POA: Diagnosis not present

## 2023-09-20 DIAGNOSIS — E785 Hyperlipidemia, unspecified: Secondary | ICD-10-CM | POA: Diagnosis not present

## 2023-09-21 DIAGNOSIS — Z820 Family history of epilepsy and other diseases of the nervous system: Secondary | ICD-10-CM | POA: Diagnosis not present

## 2023-09-21 DIAGNOSIS — I4891 Unspecified atrial fibrillation: Secondary | ICD-10-CM | POA: Diagnosis not present

## 2023-09-21 DIAGNOSIS — E785 Hyperlipidemia, unspecified: Secondary | ICD-10-CM | POA: Diagnosis not present

## 2023-09-21 DIAGNOSIS — I10 Essential (primary) hypertension: Secondary | ICD-10-CM | POA: Diagnosis not present

## 2023-09-23 ENCOUNTER — Inpatient Hospital Stay (HOSPITAL_COMMUNITY)
Admission: EM | Admit: 2023-09-23 | Discharge: 2023-10-11 | DRG: 193 | Disposition: E | Source: Skilled Nursing Facility | Attending: Family Medicine | Admitting: Family Medicine

## 2023-09-23 ENCOUNTER — Other Ambulatory Visit: Payer: Self-pay

## 2023-09-23 ENCOUNTER — Encounter (HOSPITAL_COMMUNITY): Payer: Self-pay

## 2023-09-23 ENCOUNTER — Emergency Department (HOSPITAL_COMMUNITY)

## 2023-09-23 ENCOUNTER — Inpatient Hospital Stay (HOSPITAL_COMMUNITY)

## 2023-09-23 DIAGNOSIS — J189 Pneumonia, unspecified organism: Principal | ICD-10-CM | POA: Diagnosis present

## 2023-09-23 DIAGNOSIS — Z833 Family history of diabetes mellitus: Secondary | ICD-10-CM

## 2023-09-23 DIAGNOSIS — I480 Paroxysmal atrial fibrillation: Secondary | ICD-10-CM | POA: Diagnosis not present

## 2023-09-23 DIAGNOSIS — G9341 Metabolic encephalopathy: Secondary | ICD-10-CM | POA: Diagnosis not present

## 2023-09-23 DIAGNOSIS — I4819 Other persistent atrial fibrillation: Secondary | ICD-10-CM | POA: Diagnosis present

## 2023-09-23 DIAGNOSIS — Z1152 Encounter for screening for COVID-19: Secondary | ICD-10-CM | POA: Diagnosis not present

## 2023-09-23 DIAGNOSIS — I314 Cardiac tamponade: Secondary | ICD-10-CM | POA: Diagnosis not present

## 2023-09-23 DIAGNOSIS — Z8616 Personal history of COVID-19: Secondary | ICD-10-CM

## 2023-09-23 DIAGNOSIS — G9340 Encephalopathy, unspecified: Secondary | ICD-10-CM | POA: Diagnosis not present

## 2023-09-23 DIAGNOSIS — F419 Anxiety disorder, unspecified: Secondary | ICD-10-CM | POA: Diagnosis present

## 2023-09-23 DIAGNOSIS — I4891 Unspecified atrial fibrillation: Secondary | ICD-10-CM | POA: Diagnosis present

## 2023-09-23 DIAGNOSIS — J9601 Acute respiratory failure with hypoxia: Secondary | ICD-10-CM | POA: Diagnosis not present

## 2023-09-23 DIAGNOSIS — R4182 Altered mental status, unspecified: Secondary | ICD-10-CM | POA: Diagnosis not present

## 2023-09-23 DIAGNOSIS — Z7189 Other specified counseling: Secondary | ICD-10-CM | POA: Diagnosis not present

## 2023-09-23 DIAGNOSIS — Z6841 Body Mass Index (BMI) 40.0 and over, adult: Secondary | ICD-10-CM | POA: Diagnosis not present

## 2023-09-23 DIAGNOSIS — F319 Bipolar disorder, unspecified: Secondary | ICD-10-CM | POA: Diagnosis present

## 2023-09-23 DIAGNOSIS — R109 Unspecified abdominal pain: Secondary | ICD-10-CM | POA: Diagnosis not present

## 2023-09-23 DIAGNOSIS — D638 Anemia in other chronic diseases classified elsewhere: Secondary | ICD-10-CM | POA: Diagnosis present

## 2023-09-23 DIAGNOSIS — N179 Acute kidney failure, unspecified: Secondary | ICD-10-CM | POA: Diagnosis not present

## 2023-09-23 DIAGNOSIS — K5900 Constipation, unspecified: Secondary | ICD-10-CM | POA: Diagnosis present

## 2023-09-23 DIAGNOSIS — Z9049 Acquired absence of other specified parts of digestive tract: Secondary | ICD-10-CM

## 2023-09-23 DIAGNOSIS — Z66 Do not resuscitate: Secondary | ICD-10-CM | POA: Diagnosis present

## 2023-09-23 DIAGNOSIS — E860 Dehydration: Secondary | ICD-10-CM | POA: Diagnosis present

## 2023-09-23 DIAGNOSIS — R570 Cardiogenic shock: Secondary | ICD-10-CM | POA: Diagnosis not present

## 2023-09-23 DIAGNOSIS — I5032 Chronic diastolic (congestive) heart failure: Secondary | ICD-10-CM | POA: Diagnosis present

## 2023-09-23 DIAGNOSIS — Z90710 Acquired absence of both cervix and uterus: Secondary | ICD-10-CM

## 2023-09-23 DIAGNOSIS — I3139 Other pericardial effusion (noninflammatory): Secondary | ICD-10-CM | POA: Diagnosis present

## 2023-09-23 DIAGNOSIS — J90 Pleural effusion, not elsewhere classified: Secondary | ICD-10-CM | POA: Diagnosis not present

## 2023-09-23 DIAGNOSIS — R579 Shock, unspecified: Secondary | ICD-10-CM | POA: Diagnosis not present

## 2023-09-23 DIAGNOSIS — Z515 Encounter for palliative care: Secondary | ICD-10-CM | POA: Diagnosis not present

## 2023-09-23 DIAGNOSIS — N3281 Overactive bladder: Secondary | ICD-10-CM | POA: Diagnosis present

## 2023-09-23 DIAGNOSIS — G894 Chronic pain syndrome: Secondary | ICD-10-CM | POA: Diagnosis present

## 2023-09-23 DIAGNOSIS — E66813 Obesity, class 3: Secondary | ICD-10-CM | POA: Diagnosis present

## 2023-09-23 DIAGNOSIS — R112 Nausea with vomiting, unspecified: Secondary | ICD-10-CM | POA: Diagnosis present

## 2023-09-23 DIAGNOSIS — G4733 Obstructive sleep apnea (adult) (pediatric): Secondary | ICD-10-CM | POA: Diagnosis not present

## 2023-09-23 DIAGNOSIS — E785 Hyperlipidemia, unspecified: Secondary | ICD-10-CM | POA: Diagnosis not present

## 2023-09-23 DIAGNOSIS — R34 Anuria and oliguria: Secondary | ICD-10-CM | POA: Diagnosis not present

## 2023-09-23 DIAGNOSIS — Z604 Social exclusion and rejection: Secondary | ICD-10-CM | POA: Diagnosis present

## 2023-09-23 DIAGNOSIS — Z87440 Personal history of urinary (tract) infections: Secondary | ICD-10-CM

## 2023-09-23 DIAGNOSIS — K5903 Drug induced constipation: Secondary | ICD-10-CM | POA: Diagnosis not present

## 2023-09-23 DIAGNOSIS — Z5941 Food insecurity: Secondary | ICD-10-CM

## 2023-09-23 DIAGNOSIS — Z885 Allergy status to narcotic agent status: Secondary | ICD-10-CM

## 2023-09-23 DIAGNOSIS — R7303 Prediabetes: Secondary | ICD-10-CM | POA: Diagnosis present

## 2023-09-23 DIAGNOSIS — J45909 Unspecified asthma, uncomplicated: Secondary | ICD-10-CM | POA: Diagnosis not present

## 2023-09-23 DIAGNOSIS — I6782 Cerebral ischemia: Secondary | ICD-10-CM | POA: Diagnosis not present

## 2023-09-23 DIAGNOSIS — Z7901 Long term (current) use of anticoagulants: Secondary | ICD-10-CM

## 2023-09-23 DIAGNOSIS — I10 Essential (primary) hypertension: Secondary | ICD-10-CM | POA: Diagnosis not present

## 2023-09-23 DIAGNOSIS — I110 Hypertensive heart disease with heart failure: Secondary | ICD-10-CM | POA: Diagnosis present

## 2023-09-23 DIAGNOSIS — E441 Mild protein-calorie malnutrition: Secondary | ICD-10-CM | POA: Diagnosis present

## 2023-09-23 DIAGNOSIS — Z820 Family history of epilepsy and other diseases of the nervous system: Secondary | ICD-10-CM | POA: Diagnosis not present

## 2023-09-23 DIAGNOSIS — Z7951 Long term (current) use of inhaled steroids: Secondary | ICD-10-CM

## 2023-09-23 DIAGNOSIS — Z79899 Other long term (current) drug therapy: Secondary | ICD-10-CM

## 2023-09-23 DIAGNOSIS — Z8249 Family history of ischemic heart disease and other diseases of the circulatory system: Secondary | ICD-10-CM

## 2023-09-23 DIAGNOSIS — K219 Gastro-esophageal reflux disease without esophagitis: Secondary | ICD-10-CM | POA: Diagnosis present

## 2023-09-23 LAB — BLOOD GAS, VENOUS
Acid-Base Excess: 5.7 mmol/L — ABNORMAL HIGH (ref 0.0–2.0)
Acid-Base Excess: 2.8 mmol/L — ABNORMAL HIGH (ref 0.0–2.0)
Bicarbonate: 33.7 mmol/L — ABNORMAL HIGH (ref 20.0–28.0)
Bicarbonate: 31.3 mmol/L — ABNORMAL HIGH (ref 20.0–28.0)
O2 Saturation: 46.4 %
O2 Saturation: 51.1 %
Patient temperature: 37
Patient temperature: 37
pCO2, Ven: 64 mmHg — ABNORMAL HIGH (ref 44–60)
pCO2, Ven: 65 mmHg — ABNORMAL HIGH (ref 44–60)
pH, Ven: 7.33 (ref 7.25–7.43)
pH, Ven: 7.29 (ref 7.25–7.43)
pO2, Ven: 31 mmHg — CL (ref 32–45)
pO2, Ven: 36 mmHg (ref 32–45)

## 2023-09-23 LAB — PHOSPHORUS: Phosphorus: 4.5 mg/dL (ref 2.5–4.6)

## 2023-09-23 LAB — CBC WITH DIFFERENTIAL/PLATELET
Abs Immature Granulocytes: 0.13 K/uL — ABNORMAL HIGH (ref 0.00–0.07)
Basophils Absolute: 0.1 K/uL (ref 0.0–0.1)
Basophils Relative: 1 %
Eosinophils Absolute: 0.1 K/uL (ref 0.0–0.5)
Eosinophils Relative: 1 %
HCT: 38.8 % (ref 36.0–46.0)
Hemoglobin: 12.8 g/dL (ref 12.0–15.0)
Immature Granulocytes: 1 %
Lymphocytes Relative: 8 %
Lymphs Abs: 1.3 K/uL (ref 0.7–4.0)
MCH: 33.9 pg (ref 26.0–34.0)
MCHC: 33 g/dL (ref 30.0–36.0)
MCV: 102.6 fL — ABNORMAL HIGH (ref 80.0–100.0)
Monocytes Absolute: 1.9 K/uL — ABNORMAL HIGH (ref 0.1–1.0)
Monocytes Relative: 12 %
Neutro Abs: 11.9 K/uL — ABNORMAL HIGH (ref 1.7–7.7)
Neutrophils Relative %: 77 %
Platelets: 361 K/uL (ref 150–400)
RBC: 3.78 MIL/uL — ABNORMAL LOW (ref 3.87–5.11)
RDW: 13.5 % (ref 11.5–15.5)
WBC: 15.4 K/uL — ABNORMAL HIGH (ref 4.0–10.5)
nRBC: 0 % (ref 0.0–0.2)

## 2023-09-23 LAB — COMPREHENSIVE METABOLIC PANEL WITH GFR
ALT: 11 U/L (ref 0–44)
AST: 15 U/L (ref 15–41)
Albumin: 3.2 g/dL — ABNORMAL LOW (ref 3.5–5.0)
Alkaline Phosphatase: 52 U/L (ref 38–126)
Anion gap: 10 (ref 5–15)
BUN: 13 mg/dL (ref 8–23)
CO2: 28 mmol/L (ref 22–32)
Calcium: 9.2 mg/dL (ref 8.9–10.3)
Chloride: 99 mmol/L (ref 98–111)
Creatinine, Ser: 0.77 mg/dL (ref 0.44–1.00)
GFR, Estimated: >60 mL/min (ref >60)
Glucose, Bld: 165 mg/dL — ABNORMAL HIGH (ref 70–99)
Potassium: 3.7 mmol/L (ref 3.5–5.1)
Sodium: 137 mmol/L (ref 135–145)
Total Bilirubin: 1.2 mg/dL (ref 0.0–1.2)
Total Protein: 5.9 g/dL — ABNORMAL LOW (ref 6.5–8.1)

## 2023-09-23 LAB — RESP PANEL BY RT-PCR (RSV, FLU A&B, COVID)  RVPGX2
Influenza A by PCR: NEGATIVE
Influenza B by PCR: NEGATIVE
Resp Syncytial Virus by PCR: NEGATIVE
SARS Coronavirus 2 by RT PCR: NEGATIVE

## 2023-09-23 LAB — CK: Total CK: 12 U/L — ABNORMAL LOW (ref 38–234)

## 2023-09-23 LAB — I-STAT CG4 LACTIC ACID, ED: Lactic Acid, Venous: 1.3 mmol/L (ref 0.5–1.9)

## 2023-09-23 LAB — URINALYSIS, ROUTINE W REFLEX MICROSCOPIC
Bilirubin Urine: NEGATIVE
Glucose, UA: NEGATIVE mg/dL
Hgb urine dipstick: NEGATIVE
Ketones, ur: 5 mg/dL — AB
Leukocytes,Ua: NEGATIVE
Nitrite: NEGATIVE
Protein, ur: NEGATIVE mg/dL
Specific Gravity, Urine: 1.017 (ref 1.005–1.030)
pH: 5 (ref 5.0–8.0)

## 2023-09-23 LAB — RESPIRATORY PANEL BY PCR

## 2023-09-23 LAB — VALPROIC ACID LEVEL: Valproic Acid Lvl: 67 ug/mL (ref 50–100)

## 2023-09-23 LAB — HIV ANTIBODY (ROUTINE TESTING W REFLEX): HIV Screen 4th Generation wRfx: NONREACTIVE

## 2023-09-23 LAB — BRAIN NATRIURETIC PEPTIDE: B Natriuretic Peptide: 227.9 pg/mL — ABNORMAL HIGH (ref 0.0–100.0)

## 2023-09-23 LAB — PROCALCITONIN: Procalcitonin: 0.16 ng/mL

## 2023-09-23 LAB — STREP PNEUMONIAE URINARY ANTIGEN: Strep Pneumo Urinary Antigen: NEGATIVE

## 2023-09-23 LAB — TROPONIN I (HIGH SENSITIVITY)
Troponin I (High Sensitivity): 3 ng/L (ref <18)
Troponin I (High Sensitivity): 3 ng/L (ref <18)

## 2023-09-23 LAB — MAGNESIUM: Magnesium: 1.7 mg/dL (ref 1.7–2.4)

## 2023-09-23 MED ORDER — ONDANSETRON HCL 4 MG/2ML IJ SOLN
4.0000 mg | Freq: Once | INTRAMUSCULAR | Status: AC
  Administered 2023-09-23: 4 mg via INTRAVENOUS
  Filled 2023-09-23: qty 2

## 2023-09-23 MED ORDER — VANCOMYCIN HCL 1500 MG/300ML IV SOLN
1500.0000 mg | Freq: Once | INTRAVENOUS | Status: AC
  Administered 2023-09-23: 1500 mg via INTRAVENOUS
  Filled 2023-09-23: qty 300

## 2023-09-23 MED ORDER — METOCLOPRAMIDE HCL 5 MG/ML IJ SOLN
5.0000 mg | Freq: Four times a day (QID) | INTRAMUSCULAR | Status: DC | PRN
  Administered 2023-09-23: 5 mg via INTRAVENOUS
  Filled 2023-09-23: qty 2

## 2023-09-23 MED ORDER — SODIUM CHLORIDE 0.9 % IV SOLN
2.0000 g | Freq: Three times a day (TID) | INTRAVENOUS | Status: DC
  Administered 2023-09-23 – 2023-09-28 (×14): 2 g via INTRAVENOUS
  Filled 2023-09-23 (×15): qty 12.5

## 2023-09-23 MED ORDER — SODIUM CHLORIDE 0.9 % IV BOLUS
500.0000 mL | Freq: Once | INTRAVENOUS | Status: DC
Start: 2023-09-23 — End: 2023-09-25

## 2023-09-23 MED ORDER — APIXABAN 5 MG PO TABS: 5.0000 mg | ORAL_TABLET | Freq: Two times a day (BID) | ORAL | Status: DC

## 2023-09-23 MED ORDER — FUROSEMIDE 10 MG/ML IJ SOLN: 40.0000 mg | Freq: Once | INTRAMUSCULAR | Status: DC

## 2023-09-23 MED ORDER — VANCOMYCIN HCL 1250 MG/250ML IV SOLN
1250.0000 mg | INTRAVENOUS | Status: DC
  Filled 2023-09-23: qty 250

## 2023-09-23 MED ORDER — AMIODARONE HCL 200 MG PO TABS
200.0000 mg | ORAL_TABLET | Freq: Every day | ORAL | Status: DC
Start: 2023-09-23 — End: 2023-10-04
  Administered 2023-09-24 – 2023-10-03 (×10): 200 mg via ORAL
  Filled 2023-09-23 (×11): qty 1

## 2023-09-23 MED ORDER — IOHEXOL 350 MG/ML SOLN
75.0000 mL | Freq: Once | INTRAVENOUS | Status: AC | PRN
  Administered 2023-09-23: 75 mL via INTRAVENOUS

## 2023-09-23 NOTE — Assessment & Plan Note (Signed)
-   Continue home medications

## 2023-09-23 NOTE — Assessment & Plan Note (Signed)
 Allow permissive hypertension

## 2023-09-23 NOTE — ED Notes (Signed)
 PO2 31. PA made aware.

## 2023-09-23 NOTE — Progress Notes (Signed)
 Pharmacy Antibiotic Note  Donna Sexton is a 73 y.o. female admitted on 09/23/2023 with concern for pneumonia.  Pharmacy has been consulted for vancomycin  dosing.  Plan: Vancomycin  1500 mg IV x 1, then 1250 mg IV q 24h (eAUC 490) Monitor renal function, Cx/PCR to narrow Vancomycin  levels as indicated     Temp (24hrs), Avg:98 F (36.7 C), Min:97.9 F (36.6 C), Max:98.1 F (36.7 C)  Recent Labs  Lab 09/23/23 1516 09/23/23 1523  WBC 15.4*  --   CREATININE 0.77  --   LATICACIDVEN  --  1.3    Estimated Creatinine Clearance: 72.9 mL/min (by C-G formula based on SCr of 0.77 mg/dL).    Allergies  Allergen Reactions   Morphine Nausea And Vomiting    Dorn Poot, PharmD, [facility] Clinical Pharmacist ED Pharmacist Phone # [phone] 09/23/2023 8:45 PM

## 2023-09-23 NOTE — Assessment & Plan Note (Signed)
   Bowel regimen ordered

## 2023-09-23 NOTE — Assessment & Plan Note (Signed)
 Currently actually appears to be somewhat euvolemic

## 2023-09-23 NOTE — Assessment & Plan Note (Addendum)
 Chest x-ray showing left pleural effusion and left basilar atelectasis right mild right basilar atelectasis low lung volumes Patient is currently on anticoagulation continue Eliquis  CT showed Consolidation/atelectasis in the lung bases left greater than right as before. Noted white blood cell count of 15 Treat for possible underlying pneumonia

## 2023-09-23 NOTE — Assessment & Plan Note (Signed)
 -  Continue Lipitor 40 mg daily ?

## 2023-09-23 NOTE — Assessment & Plan Note (Signed)
 CT abdomen actually showed evidence of possible consolidation given elevated white blood cell count and new hypoxia will treat for pneumonia given recent admission and course of Rocephin  while hospitalized with persistent consolidation will change of cefepime  vancomycin  check MRSA sputum cultures Legionella strep pneumo check procalcitonin ordered flutter valve

## 2023-09-23 NOTE — H&P (Signed)
 Donna Sexton FMW:[id number] DOB: [birth year] DOA: 09/23/2023     PCP: Alvan Dorothyann BIRCH, MD   Outpatient Specialists:   CARDS Dr. Peter [place], MD   Patient arrived to ER on 09/23/23 at 1420 Referred by Attending Freddi Hamilton, MD   Patient coming from:    home Lives With family     Chief Complaint:   Chief Complaint  Patient presents with   Nausea   Emesis   Weakness   Chest Pain    HPI: Donna Sexton is a 73 y.o. female with medical history significant of bipolar disorder anemia and anxiety asthma GERD IBS HLD HTN osteopenia overactive bladder sleep apnea prediabetes recurrent UTIs paroxysmal atrial fibrillation chronic diastolic CHF    Presented with chief pain shortness of breath increased confusion nausea vomiting  Patient presents with Nausea vomiting shortness of breath, CP feeling unwell recent admit for UTI and sepsis.   Patient had very similar presentation in end of August found to have UTI and sepsis at the time also was hypoxic  Hypoxia felt to be secondary to fluid overload when her blood pressure improved her Lasix  was restarted      Denies significant ETOH intake   Does not smoke   Regarding pertinent Chronic problems:    Hyperlipidemia -  on statins Lipitor (atorvastatin )  Lipid Panel     Component Value Date/Time   CHOL 154 02/04/2022 1410   TRIG 121 02/04/2022 1410   HDL 62 02/04/2022 1410   CHOLHDL 2.5 02/04/2022 1410   VLDL 28 02/03/2016 0819   LDLCALC 72 02/04/2022 1410     HTN on    chronic CHF diastolic - last echo  Recent Results (from the past 56199 hours)  ECHOCARDIOGRAM COMPLETE   Collection Time: 03/02/23 11:44 AM  Result Value   Weight 4,017.66   Height 64   BP 122/76   Single Plane A2C EF 66.1   Single Plane A4C EF 71.9   Calc EF 70.1   AV Mean grad 4.0   AV Peak grad 7.6   Ao pk vel 1.38   Est EF 70 - 75%   Narrative      ECHOCARDIOGRAM REPORT         1. Left ventricular ejection fraction, by  estimation, is 70 to 75%. The left ventricle has hyperdynamic function. Left ventricular endocardial border not optimally defined to evaluate regional wall motion. Left ventricular diastolic parameters are  indeterminate.  2. Right ventricular systolic function is normal. The right ventricular size is normal. Tricuspid regurgitation signal is inadequate for assessing PA pressure.  3. The mitral valve was not well visualized. No evidence of mitral valve regurgitation. No evidence of mitral stenosis.  4. The aortic valve was not well visualized. Aortic valve regurgitation is not visualized. No aortic stenosis is present.  5. The inferior vena cava is normal in size with greater than 50% respiratory variability, suggesting right atrial pressure of 3 mmHg.            preDM 2 -  Lab Results  Component Value Date   HGBA1C 6.2 (A) 02/16/2023    diet controlled     Morbid obesity-   BMI Readings from Last 1 Encounters:  09/12/23 39.70 kg/m       Asthma -well   controlled on home inhalers/ nebs                       A. Fib -  atrial fibrillation CHA2DS2 vas score      5    current  on anticoagulation with  Eliquis ,          -  Rate control:  Currently controlled with  Diltiazem , metoprolol    Amiodarone     Chronic anemia - baseline hg Hemoglobin & Hematocrit  Recent Labs    09/07/23 0508 09/10/23 0543 09/23/23 1516  HGB 9.8* 8.9* 12.8   Iron/TIBC/Ferritin/ %Sat    Component Value Date/Time   IRON 56 02/01/2010 2309   TIBC 363 02/01/2010 2309   FERRITIN 309 (H) 03/05/2023 0410   IRONPCTSAT 15 (L) 02/01/2010 2309     While in ER:   Chest x-ray nonacute CT abdomen did show mild to moderate pericardial effusion and persistent pulmonary effusion also showed pulmonary consolidation     Lab Orders         Resp panel by RT-PCR (RSV, Flu A&B, Covid) Anterior Nasal Swab         Brain natriuretic peptide         CBC with Differential         Comprehensive metabolic panel          Urinalysis, Routine w reflex microscopic -Urine, Clean Catch         Blood gas, venous         Procalcitonin         CK         Magnesium          Phosphorus         Prealbumin         TSH       CXR -  NON acute  CTabd/pelvis - No acute findings in the abdomen or pelvis.     Following Medications were ordered in ER: Medications  ondansetron  (ZOFRAN ) injection 4 mg (4 mg Intravenous Given 09/23/23 1602)  iohexol  (OMNIPAQUE ) 350 MG/ML injection 75 mL (75 mLs Intravenous Contrast Given 09/23/23 1652)  ondansetron  (ZOFRAN ) injection 4 mg (4 mg Intravenous Given 09/23/23 1756)    _______________________________________________________ ER Provider Called:     Cardiology   Dr. Cesario They Recommend admit to medicine    SEEN in ER  From a cardiac standpoint, not much beyond supportive care. - continue the amiodarone  200 (dose is low and should not worsen her nausea) and apixaban . - permissive tachycardia up to the 120s, hold parameters on the diltiazem  if SBP<90 - can hold lasix  for now, but be careful watching her fluid status.   ED Triage Vitals  Encounter Vitals Group     BP 09/23/23 1444 114/78     Girls Systolic BP Percentile --      Girls Diastolic BP Percentile --      Boys Systolic BP Percentile --      Boys Diastolic BP Percentile --      Pulse Rate 09/23/23 1444 96     Resp 09/23/23 1444 20     Temp 09/23/23 1444 97.9 F (36.6 C)     Temp Source 09/23/23 1444 Oral     SpO2 09/23/23 1444 91 %     Weight --      Height --      Head Circumference --      Peak Flow --      Pain Score 09/23/23 1528 5     Pain Loc --      Pain Education --      Exclude from Growth Chart --   UFJK(75)@  _________________________________________ Significant initial  Findings: Abnormal Labs Reviewed  BRAIN NATRIURETIC PEPTIDE - Abnormal; Notable for the following components:      Result Value   B Natriuretic Peptide 227.9 (*)    All other components within normal limits  CBC  WITH DIFFERENTIAL/PLATELET - Abnormal; Notable for the following components:   WBC 15.4 (*)    RBC 3.78 (*)    MCV 102.6 (*)    Neutro Abs 11.9 (*)    Monocytes Absolute 1.9 (*)    Abs Immature Granulocytes 0.13 (*)    All other components within normal limits  COMPREHENSIVE METABOLIC PANEL WITH GFR - Abnormal; Notable for the following components:   Glucose, Bld 165 (*)    Total Protein 5.9 (*)    Albumin 3.2 (*)    All other components within normal limits  URINALYSIS, ROUTINE W REFLEX MICROSCOPIC - Abnormal; Notable for the following components:   Color, Urine AMBER (*)    Ketones, ur 5 (*)    All other components within normal limits      _________________________ Troponin  ordered Cardiac Panel (last 3 results) Recent Labs    09/23/23 1516 09/23/23 1727  TROPONINIHS 3 3     ECG: Ordered Personally reviewed and interpreted by me showing: HR : 94 Rhythm: Atrial fibrillation Low voltage, precordial leads Abnormal T, consider ischemia, diffuse leads QTC 438  BNP (last 3 results) Recent Labs    07/21/23 1237 09/05/23 0653 09/23/23 1516  BNP 96.8 510.9* 227.9*     The recent clinical data is shown below. Vitals:   09/23/23 1444 09/23/23 1527  BP: 114/78   Pulse: 96   Resp: 20   Temp: 97.9 F (36.6 C)   TempSrc: Oral   SpO2: 91% 94%      WBC     Component Value Date/Time   WBC 15.4 (H) 09/23/2023 1516   LYMPHSABS 1.3 09/23/2023 1516   LYMPHSABS 1.4 05/25/2023 1441   MONOABS 1.9 (H) 09/23/2023 1516   EOSABS 0.1 09/23/2023 1516   EOSABS 0.2 05/25/2023 1441   BASOSABS 0.1 09/23/2023 1516   BASOSABS 0.1 05/25/2023 1441      UA   no evidence of UTI     Urine analysis:    Component Value Date/Time   COLORURINE AMBER (A) 09/23/2023 1509   APPEARANCEUR CLEAR 09/23/2023 1509   LABSPEC 1.017 09/23/2023 1509   PHURINE 5.0 09/23/2023 1509   GLUCOSEU NEGATIVE 09/23/2023 1509   GLUCOSEU NEGATIVE 10/23/2009 1116   HGBUR NEGATIVE 09/23/2023 1509    BILIRUBINUR NEGATIVE 09/23/2023 1509   BILIRUBINUR negative 06/26/2023 1351   BILIRUBINUR neg 07/05/2016 1113   KETONESUR 5 (A) 09/23/2023 1509   PROTEINUR NEGATIVE 09/23/2023 1509   UROBILINOGEN 1.0 06/26/2023 1351   UROBILINOGEN 1.0 02/01/2010 1926   NITRITE NEGATIVE 09/23/2023 1509   LEUKOCYTESUR NEGATIVE 09/23/2023 1509    Results for orders placed or performed during the hospital encounter of 09/23/23  Resp panel by RT-PCR (RSV, Flu A&B, Covid) Anterior Nasal Swab     Status: None   Collection Time: 09/23/23  7:48 PM   Specimen: Anterior Nasal Swab  Result Value Ref Range Status   SARS Coronavirus 2 by RT PCR NEGATIVE NEGATIVE Final   Influenza A by PCR NEGATIVE NEGATIVE Final   Influenza B by PCR NEGATIVE NEGATIVE Final         Resp Syncytial Virus by PCR NEGATIVE NEGATIVE Final           ________________________________________________________________  Venous  Blood Gas result:  pH  Latest Reference Range & Units 09/23/23 19:48  pH, Ven 7.25 - 7.43  7.33  pCO2, Ven 44 - 60 mmHg 64 (H)  pO2, Ven 32 - 45 mmHg 31 (LL)  (LL): Data is critically low (H): Data is abnormally high     __________________________________________________________ Recent Labs  Lab 09/23/23 1516  NA 137  K 3.7  CO2 28  GLUCOSE 165*  BUN 13  CREATININE 0.77  CALCIUM  9.2    Cr    stable,   Lab Results  Component Value Date   CREATININE 0.77 09/23/2023   CREATININE 0.53 09/10/2023   CREATININE 0.68 09/07/2023    Recent Labs  Lab 09/23/23 1516  AST 15  ALT 11  ALKPHOS 52  BILITOT 1.2  PROT 5.9*  ALBUMIN 3.2*   Lab Results  Component Value Date   CALCIUM  9.2 09/23/2023   PHOS 3.2 09/10/2023    Plt: Lab Results  Component Value Date   PLT 361 09/23/2023       Recent Labs  Lab 09/23/23 1516  WBC 15.4*  NEUTROABS 11.9*  HGB 12.8  HCT 38.8  MCV 102.6*  PLT 361    HG/HCT   stable,      Component Value Date/Time   HGB 12.8 09/23/2023 1516   HGB 12.2 05/25/2023  1441   HCT 38.8 09/23/2023 1516   HCT 36.6 05/25/2023 1441   MCV 102.6 (H) 09/23/2023 1516   MCV 103 (H) 05/25/2023 1441     _______________________________________________ Hospitalist was called for admission for acute respiratory failure with hypoxia   The following Work up has been ordered so far:  Orders Placed This Encounter  Procedures   ED Echo US  Bedside   Resp panel by RT-PCR (RSV, Flu A&B, Covid) Anterior Nasal Swab   DG Chest Portable 1 View   CT ABDOMEN PELVIS W CONTRAST   Brain natriuretic peptide   CBC with Differential   Comprehensive metabolic panel   Urinalysis, Routine w reflex microscopic -Urine, Clean Catch   Blood gas, venous   Procalcitonin   CK   Magnesium    Phosphorus   Prealbumin   TSH   Consult to hospitalist   Inpatient consult to Cardiology   Airborne and Contact precautions   EKG 12-Lead   ED EKG     OTHER Significant initial  Findings:  labs showing:     DM  labs:  HbA1C: Recent Labs    02/16/23 1014  HGBA1C 6.2*       CBG (last 3)  No results for input(s): GLUCAP in the last 72 hours.        Cultures:    Component Value Date/Time   SDES  09/06/2023 1220    URINE, CLEAN CATCH Performed at [facility], [address] [place], [place] [address]    SPECREQUEST  09/06/2023 1220    NONE Performed at [facility], [address] [place], [place] [address]    CULT (A) 09/06/2023 1220    >=100,000 COLONIES/mL PSEUDOMONAS AERUGINOSA >=100,000 COLONIES/mL ENTEROCOCCUS FAECALIS    REPTSTATUS 09/09/2023 FINAL 09/06/2023 1220     Radiological Exams on Admission: CT ABDOMEN PELVIS W CONTRAST Result Date: 09/23/2023 EXAM: CT ABDOMEN AND PELVIS WITH CONTRAST 09/23/2023 04:51:42 PM TECHNIQUE: CT of the abdomen and pelvis was performed with the administration of intravenous contrast (75mL iohexol  (OMNIPAQUE ) 350 MG/ML injection). Multiplanar reformatted images are provided for review.  Automated exposure control, iterative reconstruction, and/or weight-based adjustment  of the mA/kV was utilized to reduce the radiation dose to as low as reasonably achievable. COMPARISON: 09/07/2023 CLINICAL HISTORY: Abdominal pain, acute, nonlocalized. Chief complaints; Nausea; Emesis; Weakness; Chest Pain. FINDINGS: LOWER CHEST: Small pleural effusions left greater than right as before. Consolidation/atelectasis in the lung bases left greater than right as before. LIVER: Probable cyst in hepatic segment 4a, stable. GALLBLADDER AND BILE DUCTS: Cholecystectomy clips. SPLEEN: No acute abnormality. PANCREAS: No acute abnormality. ADRENAL GLANDS: No acute abnormality. KIDNEYS, URETERS AND BLADDER: No stones in the kidneys or ureters. No hydronephrosis. No perinephric or periureteral stranding. Urinary bladder is nondistended. GI AND BOWEL: Scattered distal descending and sigmoid diverticula without adjacent inflammatory change. Moderate fecal material mildly distends the rectum, without adjacent inflammatory change. There is no bowel obstruction. PERITONEUM AND RETROPERITONEUM: No ascites. No free air. VASCULATURE: Aorta is normal in caliber. LYMPH NODES: No lymphadenopathy. REPRODUCTIVE ORGANS: Post hysterectomy. BONES AND SOFT TISSUES: Postsurgical decompression L4-5. No acute osseous abnormality. No focal soft tissue abnormality. HEART AND MEDIASTINUM: Small to moderate pericardial effusion has increased. IMPRESSION: 1. No acute findings in the abdomen or pelvis. Electronically signed by: Katheleen Faes MD 09/23/2023 04:58 PM EDT RP Workstation: [id number]   DG Chest Portable 1 View Result Date: 09/23/2023 CLINICAL DATA:  Chest pain.  Nausea and emesis with weakness. EXAM: PORTABLE CHEST 1 VIEW COMPARISON:  CT scan 10/08/2023 and chest radiograph 09/05/2023 FINDINGS: The patient is rotated to the right on today's radiograph, reducing diagnostic sensitivity and specificity. Low lung volumes are present, causing  crowding of the pulmonary vasculature. Moderate cardiomegaly noted. Obscured left hemidiaphragm compatible with known left pleural effusion and left basilar atelectasis. Lesser degree of indistinctness of the right hemidiaphragm favoring peripheral atelectasis in the right lower lobe. This is roughly similar to the appearance on the CT scan from 09/07/2023. Atherosclerotic calcification of the aortic arch. Degenerative glenohumeral arthropathy. Bony demineralization. IMPRESSION: 1. Moderate cardiomegaly. 2. Left pleural effusion and left basilar atelectasis. 3. Mild right basilar atelectasis. 4. Low lung volumes. 5. Degenerative glenohumeral arthropathy. 6. Bony demineralization. Electronically Signed   By: Ryan Salvage M.D.   On: 09/23/2023 16:14   _______________________________________________________________________________________________________ Latest  Blood pressure 114/78, pulse 96, temperature 97.9 F (36.6 C), temperature source Oral, resp. rate 20, SpO2 94%.   Vitals  labs and radiology finding personally reviewed  Review of Systems:    Pertinent positives include:   abdominal pain, nausea, vomiting, shortness of breath at rest. Constitutional:  No weight loss, night sweats, Fevers, chills, fatigue, weight loss  HEENT:  No headaches, Difficulty swallowing,Tooth/dental problems,Sore throat,  No sneezing, itching, ear ache, nasal congestion, post nasal drip,  Cardio-vascular:  No chest pain, Orthopnea, PND, anasarca, dizziness, palpitations.no Bilateral lower extremity swelling  GI:  No heartburn, indigestion, diarrhea, change in bowel habits, loss of appetite, melena, blood in stool, hematemesis Resp:  no  No dyspnea on exertion, No excess mucus, no productive cough, No non-productive cough, No coughing up of blood.No change in color of mucus.No wheezing. Skin:  no rash or lesions. No jaundice GU:  no dysuria, change in color of urine, no urgency or frequency. No straining to  urinate.  No flank pain.  Musculoskeletal:  No joint pain or no joint swelling. No decreased range of motion. No back pain.  Psych:  No change in mood or affect. No depression or anxiety. No memory loss.  Neuro: no localizing neurological complaints, no tingling, no weakness, no double vision, no gait abnormality, no slurred speech, no confusion  All systems  reviewed and apart from HOPI all are negative _______________________________________________________________________________________________ Past Medical History:   Past Medical History:  Diagnosis Date   Acute hypoxemic respiratory failure due to COVID-19 (HCC) 03/01/2023   Allergy Morphine   ANEMIA 11/12/2007   Qualifier: Diagnosis of  By: Sherron CMA, Steen     Anxiety    Arthritis    ASTHMA NOS W/ACUTE EXACERBATION 01/13/2010   Qualifier: Diagnosis of  By: Joshua MD, Debby CROME.    BIPOLAR DISORDER UNSPECIFIED 10/29/2007   Qualifier: Diagnosis of  By: Joshua MD, Debby CROME.    CHF (congestive heart failure) (HCC)    COMMON MIGRAINE 12/29/2008   Qualifier: Diagnosis of  By: Joshua MD, Debby CROME.    Encephalopathy due to infection 04/13/2023   GERD 07/28/2009   Qualifier: Diagnosis of  By: Inocencio MD, [place] LABOR    HYPERLIPIDEMIA 10/29/2007   Qualifier: Diagnosis of  By: Joshua MD, Debby CROME.    HYPERTENSION, BENIGN ESSENTIAL 10/29/2007   Qualifier: Diagnosis of  By: Joshua MD, Debby CROME.    Orbital mass, right 03/13/2023   OSTEOPENIA 10/30/2007   Qualifier: Diagnosis of  By: Joshua MD, Debby CROME.    OVERACTIVE BLADDER 02/21/2008   Qualifier: Diagnosis of  By: Norleen MD, [place] ORN    SLEEP APNEA 12/31/2009   Qualifier: Diagnosis of  By: Joshua MD, Debby CROME.    Trigger finger, left little finger 03/15/2017      Past Surgical History:  Procedure Laterality Date   ABDOMINAL HYSTERECTOMY     BREAST IMPLANT REMOVAL Bilateral 2022   BREAST SURGERY  01/10/1978   implants bilateral   CARPAL TUNNEL RELEASE     CHOLECYSTECTOMY      COSMETIC SURGERY     EYE SURGERY  cataract both eyes   HERNIA REPAIR     JOINT REPLACEMENT     PAROTID ENDOSCOPY     SPINE SURGERY  lower back   TUBAL LIGATION      Social History:  Ambulatory   cane,       reports that she has never smoked. She has never used smokeless tobacco. She reports that she does not drink alcohol and does not use drugs.     Family History:   Family History  Problem Relation Age of Onset   Heart attack Mother    Hypertension Mother    Heart failure Mother    Anxiety disorder Mother    Heart attack Father    Hypertension Father    Diabetes Father    Arthritis Father    Heart failure Brother    Alcohol abuse Maternal Uncle    Alcohol abuse Maternal Uncle    Breast cancer Neg Hx    ______________________________________________________________________________________________ Allergies: Allergies  Allergen Reactions   Morphine Nausea And Vomiting     Prior to Admission medications   Medication Sig Start Date End Date Taking? Authorizing Provider  acetaminophen  (TYLENOL ) 500 MG tablet Take 1 tablet (500 mg total) by mouth every 6 (six) hours as needed for mild pain (pain score 1-3) (or Fever >/= 101). Patient taking differently: Take 1,000 mg by mouth 3 (three) times daily. 03/10/23   Rai, Nydia POUR, MD  amiodarone  (PACERONE ) 100 MG tablet Take 100 mg by mouth daily.    [provider]  apixaban  (ELIQUIS ) 5 MG TABS tablet Take 1 tablet (5 mg total) by mouth 2 (two) times daily. 06/20/23   Alvan Dorothyann BIRCH, MD  atorvastatin  (LIPITOR) 40 MG tablet Take 1 tablet (40 mg  total) by mouth daily. 05/22/23   Alvan Dorothyann BIRCH, MD  bisacodyl  (DULCOLAX) 10 MG suppository Place 10 mg rectally daily as needed for moderate constipation.    [provider]  diltiazem  (CARDIZEM  CD) 120 MG 24 hr capsule Take 120 mg by mouth daily.    [provider]  divalproex  (DEPAKOTE  ER) 500 MG 24 hr tablet Take 3 tablets (1,500 mg total) by  mouth at bedtime. 09/01/22   Cottle, Lorene KANDICE Raddle., MD  famotidine  (PEPCID ) 20 MG tablet Take 1 tablet (20 mg total) by mouth daily. 09/12/23   Raenelle Coria, MD  furosemide  (LASIX ) 40 MG tablet Take 1 tablet (40 mg total) by mouth daily. Patient taking differently: Take 80 mg by mouth daily. 05/17/23   Rana Lum CROME, NP  HYDROcodone -acetaminophen  (NORCO/VICODIN) 5-325 MG tablet Take 1 tablet by mouth every 6 (six) hours as needed for moderate pain (pain score 4-6). 09/12/23   Raenelle Coria, MD  levalbuterol  (XOPENEX ) 0.63 MG/3ML nebulizer solution Take 3 mLs (0.63 mg total) by nebulization every 6 (six) hours as needed for wheezing or shortness of breath. 03/10/23   Rai, Nydia POUR, MD  metoprolol  tartrate (LOPRESSOR ) 25 MG tablet Take 1 tablet (25 mg total) by mouth 2 (two) times daily. 07/07/23 10/05/23  Terra Fairy PARAS, PA-C  mirabegron  ER (MYRBETRIQ ) 25 MG TB24 tablet Take 25 mg by mouth at bedtime.    [provider]  mometasone -formoterol  (DULERA ) 100-5 MCG/ACT AERO Inhale 2 puffs into the lungs 2 (two) times daily. 03/10/23   Rai, Ripudeep K, MD  nitrofurantoin , macrocrystal-monohydrate, (MACROBID ) 100 MG capsule Take 1 capsule (100 mg total) by mouth at bedtime. 09/12/23   Raenelle Coria, MD  ondansetron  (ZOFRAN ) 4 MG tablet Take 1 tablet (4 mg total) by mouth every 6 (six) hours as needed for nausea. 07/27/23   Sebastian Toribio GAILS, MD  polyethylene glycol (MIRALAX  / GLYCOLAX ) 17 g packet Take 17 g by mouth daily as needed for moderate constipation. Patient taking differently: Take 17 g by mouth daily. 04/19/23   Arlice Reichert, MD  potassium chloride  SA (KLOR-CON  M) 20 MEQ tablet Take 1 tablet (20 mEq total) by mouth daily. 06/28/23   Alvan Dorothyann BIRCH, MD  pregabalin  (LYRICA ) 50 MG capsule Take 1 capsule (50 mg total) by mouth 2 (two) times daily for 5 days. 09/12/23 09/17/23  Raenelle Coria, MD  senna-docusate (SENOKOT-S) 8.6-50 MG tablet Take 2 tablets by mouth at bedtime. 03/10/23   Rai,  Nydia POUR, MD  traZODone  (DESYREL ) 50 MG tablet Take 50 mg by mouth at bedtime as needed for sleep.    [provider]    ___________________________________________________________________________________________________ Physical Exam:    09/23/2023    2:44 PM 09/12/2023    1:01 PM 09/12/2023    9:48 AM  Vitals with BMI  Systolic 114 121 877  Diastolic 78 98 78  Pulse 96 98 91     1. General:  in No  Acute distress   Chronically ill  -appearing 2. Psychological: Alert but fatigued and  Oriented to self and situation 3. Head/ENT:    Dry Mucous Membranes                          Head Non traumatic, neck supple                            Poor Dentition 4. SKIN:  decreased Skin turgor,  Skin clean Dry and intact no rash    5. Heart: Regular rate and rhythm no  Murmur, no Rub or gallop 6. Lungs:    no wheezes or crackles   7. Abdomen: Soft, lower abd -tender, Non distended   obese  bowel sounds present 8. Lower extremities: no clubbing, cyanosis, no  edema 9. Neurologically Grossly intact, moving all 4 extremities equally   10. MSK: Normal range of motion    Chart has been reviewed  ______________________________________________________________________________________________  Assessment/Plan  73 y.o. female with medical history significant of bipolar disorder anemia and anxiety asthma GERD IBS HLD HTN osteopenia overactive bladder sleep apnea prediabetes recurrent UTIs paroxysmal atrial fibrillation chronic diastolic CHF  Admitted for acute respiratory failure with hypoxia  Present on Admission:  Acute respiratory failure with hypoxia (HCC)  Acute metabolic encephalopathy  Atrial fibrillation with rapid ventricular response (HCC)  Bipolar disorder (HCC)  Chronic heart failure with preserved ejection fraction (HCC)  Class 3 obesity  Constipation  Essential hypertension  Hyperlipidemia  CAP (community acquired pneumonia)  Pericardial effusion     Acute  metabolic encephalopathy   - most likely multifactorial secondary to   mild dehydration secondary to decreased by mouth intake,   - hold off on diuresis  - Hold contributing medications   - if no improvement may need further imaging to evaluate for CNS pathology pathology such as MRI of the brain   - neurological exam appears to be nonfocal but patient unable to cooperate fully   - VBG compensated despite mild  hypercarbia    - no history of liver disease ammonia pending     Acute respiratory failure with hypoxia (HCC) Chest x-ray showing left pleural effusion and left basilar atelectasis right mild right basilar atelectasis low lung volumes Patient is currently on anticoagulation continue Eliquis  CT showed Consolidation/atelectasis in the lung bases left greater than right as before. Noted white blood cell count of 15 Treat for possible underlying pneumonia     Atrial fibrillation with rapid ventricular response (HCC) Continue diltiazem  p.o. with holding parameters continue amiodarone  200 mg daily continue Eliquis  5 mg p.o. twice daily as per cardiology recommendations  Bipolar disorder (HCC) Continue home medications  Chronic heart failure with preserved ejection fraction (HCC) Currently actually appears to be somewhat euvolemic  Class 3 obesity -Contributing to comorbidity and complicating medical management     Nutritional follow up as an out pt would be recommended    Constipation Bowel regimen ordered  Essential hypertension Allow permissive hypertension  Hyperlipidemia Continue Lipitor 40 mg daily  CAP (community acquired pneumonia) CT abdomen actually showed evidence of possible consolidation given elevated white blood cell count and new hypoxia will treat for pneumonia given recent admission and course of Rocephin  while hospitalized with persistent consolidation will change of cefepime  vancomycin  check MRSA sputum cultures Legionella strep pneumo check  procalcitonin ordered flutter valve  Pericardial effusion Appreciate cardiology consult Will order echogram monitor on telemetry no evidence of hemodynamic pericardial effusion at this time   Other plan as per orders.  DVT prophylaxis: eliquis       Code Status:    Code Status: Prior FULL CODE  as per patient   I had personally discussed CODE STATUS with patient   ACP   none  Family Communication:   Family not at  Bedside    Diet clears   Disposition Plan:       To home once workup is complete and patient is stable   Following barriers  for discharge:                             Dyspnea work up is complete                            Electrolytes corrected                                                           Pain controlled with PO medications                              white count improving able to transition to PO antibiotics                             Will need to be able to tolerate PO                            Will likely need home health, home O2, set up                           Will need consultants to evaluate patient prior to discharge                           Work of breathing improves       Consult Orders  (From admission, onward)           Start     Ordered   09/24/23 2050  Nutritional services consult  Once       Provider:  (Not yet assigned)  Question:  Reason for Consult?  Answer:  assess nutrtional status   09/23/23 2052   09/23/23 1813  Consult to hospitalist  Pg sent by deloris 18:12  Once       Provider:  (Not yet assigned)  Question Answer Comment  Place call to: [facility]   Reason for Consult Admit      09/23/23 1812                            Would benefit from PT/OT eval prior to DC  Ordered                                     Consults called: cardiology   Admission status:  ED Disposition     ED Disposition  Admit   Condition  --   Comment  Hospital Area: [facility] [[id number]]  Level of  Care: Progressive [102]  Admit to Progressive based on following criteria: CARDIOVASCULAR & THORACIC of moderate stability with acute coronary syndrome symptoms/low risk myocardial infarction/hypertensive urgency/arrhythmias/heart failure potentially compromising stability and stable post cardiovascular intervention patients.  Admit to Progressive based on following criteria: NEUROLOGICAL AND NEUROSURGICAL complex patients with significant risk of instability, who do not meet ICU criteria, yet require close observation or frequent assessment (< / = every 2 -  4 hours) with medical / nursing intervention.  May admit patient to [facility] or [facility] if equivalent level of care is available:: No  Covid Evaluation: Asymptomatic - no recent exposure (last 10 days) testing not required  Diagnosis: Acute respiratory failure with hypoxia [facility]) [[id number]]  Admitting Physician: Aadvika Konen [[id number]]  Attending Physician: Delle Andrzejewski [[id number]]  Certification:: I certify this patient will need inpatient services for at least 2 midnights  Expected Medical Readiness: 09/26/2023           inpatient     I Expect 2 midnight stay secondary to severity of patient's current illness need for inpatient interventions justified by the following:  hemodynamic instability despite optimal treatment (tachycardia  hypotension * tachypnea  hypoxia, hypercapnia)   and extensive comorbidities including:   CHF  asthma  Obesity    Chronic anticoagulation  That are currently affecting medical management.   I expect  patient to be hospitalized for 2 midnights requiring inpatient medical care.  Patient is at high risk for adverse outcome (such as loss of life or disability) if not treated.  Indication for inpatient stay as follows:  Severe change from baseline regarding mental status Hemodynamic instability despite maximal medical therapy   Need for IV antibiotics, IV fluids,    Level of care        progressive      Rowland Ericsson 09/23/2023, 9:05 PM    [facility]     after 2 AM please page floor coverage   If 7AM-7PM, please contact the day team taking care of the patient using [email or web address]

## 2023-09-23 NOTE — ED Provider Notes (Signed)
 Ultrasound ED Echo  Date/Time: 09/23/2023 6:59 PM  Performed by: Freddi Hamilton, MD Authorized by: Freddi Hamilton, MD   Procedure details:    Indications: dyspnea     Views: subxiphoid and parasternal long axis view     Images: archived     Limitations:  Acoustic shadowing, patient compliance, positioning and body habitus Findings:    Pericardium: small pericardial effusion   Impression:    Impression: pericardial effusion present     Limited exam as patient was having intermittent nausea/vomiting. However, while there is a pericardial effusion, no evidence of tamponade.   Freddi Hamilton, MD 09/23/23 [phone]

## 2023-09-23 NOTE — ED Provider Notes (Signed)
 [place] EMERGENCY DEPARTMENT AT [facility] - Closed Provider Note   CSN: [id number] Arrival date & time: 09/23/23  1420     Patient presents with: No chief complaint on file.   Donna Sexton is a 73 y.o. female.  {Add pertinent medical, surgical, social history, OB history to YEP:67052} Patient with history of acute respiratory failure, CHF, GERD, hyperlipidemia, hypertension, afib on Eliquis  presents today from [facility] rehab center with complaints of chest pain, nausea, vomiting. Reports that she started feeling unwell yesterday. Nausea and vomiting began this morning. Reports she has had persistent green emesis since this morning. Reports that she was having some chest and upper abdominal pain that started yesterday. Also reports feeling short of breath. She is unsure if she is fluid overloaded. Has been taking her diuresis as prescribed. She is currently on oxygen, reports at her facility her oxygen saturation was in the 80s so she was placed on 2L. Reports that when she went home from the hospital she was originally on oxygen, however she had been successfully weaned back to room air since then. Denies history of similar symptoms previously. Denies diarrhea. Was recently admitted for urosepsis, reports since then she has been started on daily macrobid  which she has been taking.   The history is provided by the patient. No language interpreter was used.       Prior to Admission medications   Medication Sig Start Date End Date Taking? Authorizing Provider  acetaminophen  (TYLENOL ) 500 MG tablet Take 1 tablet (500 mg total) by mouth every 6 (six) hours as needed for mild pain (pain score 1-3) (or Fever >/= 101). Patient taking differently: Take 1,000 mg by mouth 3 (three) times daily. 03/10/23   Rai, Nydia POUR, MD  amiodarone  (PACERONE ) 100 MG tablet Take 100 mg by mouth daily.    [provider]  apixaban  (ELIQUIS ) 5 MG TABS tablet Take 1 tablet (5 mg total) by mouth 2  (two) times daily. 06/20/23   Alvan Dorothyann BIRCH, MD  atorvastatin  (LIPITOR) 40 MG tablet Take 1 tablet (40 mg total) by mouth daily. 05/22/23   Alvan Dorothyann BIRCH, MD  bisacodyl  (DULCOLAX) 10 MG suppository Place 10 mg rectally daily as needed for moderate constipation.    [provider]  diltiazem  (CARDIZEM  CD) 120 MG 24 hr capsule Take 120 mg by mouth daily.    [provider]  divalproex  (DEPAKOTE  ER) 500 MG 24 hr tablet Take 3 tablets (1,500 mg total) by mouth at bedtime. 09/01/22   Cottle, Lorene KANDICE Raddle., MD  famotidine  (PEPCID ) 20 MG tablet Take 1 tablet (20 mg total) by mouth daily. 09/12/23   Ghimire, Kuber, MD  furosemide  (LASIX ) 40 MG tablet Take 1 tablet (40 mg total) by mouth daily. Patient taking differently: Take 80 mg by mouth daily. 05/17/23   Rana Lum LITTIE, NP  HYDROcodone -acetaminophen  (NORCO/VICODIN) 5-325 MG tablet Take 1 tablet by mouth every 6 (six) hours as needed for moderate pain (pain score 4-6). 09/12/23   Raenelle Coria, MD  levalbuterol  (XOPENEX ) 0.63 MG/3ML nebulizer solution Take 3 mLs (0.63 mg total) by nebulization every 6 (six) hours as needed for wheezing or shortness of breath. 03/10/23   Rai, Nydia POUR, MD  metoprolol  tartrate (LOPRESSOR ) 25 MG tablet Take 1 tablet (25 mg total) by mouth 2 (two) times daily. 07/07/23 10/05/23  Terra Fairy PARAS, PA-C  mirabegron  ER (MYRBETRIQ ) 25 MG TB24 tablet Take 25 mg by mouth at bedtime.    [provider]  mometasone -formoterol  (  DULERA ) 100-5 MCG/ACT AERO Inhale 2 puffs into the lungs 2 (two) times daily. 03/10/23   Rai, Nydia POUR, MD  nitrofurantoin , macrocrystal-monohydrate, (MACROBID ) 100 MG capsule Take 1 capsule (100 mg total) by mouth at bedtime. 09/12/23   Raenelle Coria, MD  ondansetron  (ZOFRAN ) 4 MG tablet Take 1 tablet (4 mg total) by mouth every 6 (six) hours as needed for nausea. 07/27/23   Sebastian Toribio GAILS, MD  polyethylene glycol (MIRALAX  / GLYCOLAX ) 17 g packet Take 17 g by mouth daily  as needed for moderate constipation. Patient taking differently: Take 17 g by mouth daily. 04/19/23   Arlice Reichert, MD  potassium chloride  SA (KLOR-CON  M) 20 MEQ tablet Take 1 tablet (20 mEq total) by mouth daily. 06/28/23   Alvan Dorothyann BIRCH, MD  pregabalin  (LYRICA ) 50 MG capsule Take 1 capsule (50 mg total) by mouth 2 (two) times daily for 5 days. 09/12/23 09/17/23  Raenelle Coria, MD  senna-docusate (SENOKOT-S) 8.6-50 MG tablet Take 2 tablets by mouth at bedtime. 03/10/23   Rai, Nydia POUR, MD  traZODone  (DESYREL ) 50 MG tablet Take 50 mg by mouth at bedtime as needed for sleep.    [provider]    Allergies: Morphine    Review of Systems  Respiratory:  Positive for shortness of breath.   Cardiovascular:  Positive for chest pain.  Gastrointestinal:  Positive for abdominal pain, nausea and vomiting.  All other systems reviewed and are negative.   Updated Vital Signs BP 114/78 (BP Location: Left Arm)   Pulse 96   Temp 97.9 F (36.6 C) (Oral)   Resp 20   SpO2 91%   Physical Exam Vitals and nursing note reviewed.  Constitutional:      General: She is not in acute distress.    Appearance: Normal appearance. She is obese. She is ill-appearing. She is not toxic-appearing or diaphoretic.  HENT:     Head: Normocephalic and atraumatic.  Cardiovascular:     Rate and Rhythm: Normal rate and regular rhythm.     Heart sounds: Normal heart sounds.  Pulmonary:     Effort: Pulmonary effort is normal. No respiratory distress.  Chest:     Chest wall: Tenderness present.     Comments: Generalized anterior chest wall tenderness to palpation Abdominal:     Palpations: Abdomen is soft.     Tenderness: There is abdominal tenderness.     Comments: Generalized abdominal TTP throughout without rebound or guarding  Musculoskeletal:        General: Normal range of motion.     Cervical back: Normal range of motion.     Right lower leg: No tenderness.     Left lower leg: No tenderness.      Comments: 1+ pitting edema to bilateral lower extremities  Skin:    General: Skin is warm and dry.  Neurological:     General: No focal deficit present.     Mental Status: She is alert.  Psychiatric:        Mood and Affect: Mood normal.        Behavior: Behavior normal.     (all labs ordered are listed, but only abnormal results are displayed) Labs Reviewed  BRAIN NATRIURETIC PEPTIDE - Abnormal; Notable for the following components:      Result Value   B Natriuretic Peptide 227.9 (*)    All other components within normal limits  CBC WITH DIFFERENTIAL/PLATELET - Abnormal; Notable for the following components:   WBC 15.4 (*)  RBC 3.78 (*)    MCV 102.6 (*)    Neutro Abs 11.9 (*)    Monocytes Absolute 1.9 (*)    Abs Immature Granulocytes 0.13 (*)    All other components within normal limits  COMPREHENSIVE METABOLIC PANEL WITH GFR - Abnormal; Notable for the following components:   Glucose, Bld 165 (*)    Total Protein 5.9 (*)    Albumin 3.2 (*)    All other components within normal limits  URINALYSIS, ROUTINE W REFLEX MICROSCOPIC - Abnormal; Notable for the following components:   Color, Urine AMBER (*)    Ketones, ur 5 (*)    All other components within normal limits  I-STAT CG4 LACTIC ACID, ED  TROPONIN I (HIGH SENSITIVITY)  TROPONIN I (HIGH SENSITIVITY)    EKG: EKG Interpretation Date/Time:  Saturday September 23 2023 14:44:24 EDT Ventricular Rate:  94 PR Interval:    QRS Duration:  95 QT Interval:  350 QTC Calculation: 438 R Axis:   16  Text Interpretation: Atrial fibrillation Low voltage, precordial leads Abnormal T, consider ischemia, diffuse leads similar to Aug 2025 Confirmed by Freddi Hamilton [phone]) on 09/23/2023 2:55:28 PM  Radiology: CT ABDOMEN PELVIS W CONTRAST Result Date: 09/23/2023 EXAM: CT ABDOMEN AND PELVIS WITH CONTRAST 09/23/2023 04:51:42 PM TECHNIQUE: CT of the abdomen and pelvis was performed with the administration of intravenous contrast  (75mL iohexol  (OMNIPAQUE ) 350 MG/ML injection). Multiplanar reformatted images are provided for review. Automated exposure control, iterative reconstruction, and/or weight-based adjustment of the mA/kV was utilized to reduce the radiation dose to as low as reasonably achievable. COMPARISON: 09/07/2023 CLINICAL HISTORY: Abdominal pain, acute, nonlocalized. Chief complaints; Nausea; Emesis; Weakness; Chest Pain. FINDINGS: LOWER CHEST: Small pleural effusions left greater than right as before. Consolidation/atelectasis in the lung bases left greater than right as before. LIVER: Probable cyst in hepatic segment 4a, stable. GALLBLADDER AND BILE DUCTS: Cholecystectomy clips. SPLEEN: No acute abnormality. PANCREAS: No acute abnormality. ADRENAL GLANDS: No acute abnormality. KIDNEYS, URETERS AND BLADDER: No stones in the kidneys or ureters. No hydronephrosis. No perinephric or periureteral stranding. Urinary bladder is nondistended. GI AND BOWEL: Scattered distal descending and sigmoid diverticula without adjacent inflammatory change. Moderate fecal material mildly distends the rectum, without adjacent inflammatory change. There is no bowel obstruction. PERITONEUM AND RETROPERITONEUM: No ascites. No free air. VASCULATURE: Aorta is normal in caliber. LYMPH NODES: No lymphadenopathy. REPRODUCTIVE ORGANS: Post hysterectomy. BONES AND SOFT TISSUES: Postsurgical decompression L4-5. No acute osseous abnormality. No focal soft tissue abnormality. HEART AND MEDIASTINUM: Small to moderate pericardial effusion has increased. IMPRESSION: 1. No acute findings in the abdomen or pelvis. Electronically signed by: Katheleen Faes MD 09/23/2023 04:58 PM EDT RP Workstation: [id number]   DG Chest Portable 1 View Result Date: 09/23/2023 CLINICAL DATA:  Chest pain.  Nausea and emesis with weakness. EXAM: PORTABLE CHEST 1 VIEW COMPARISON:  CT scan 10/08/2023 and chest radiograph 09/05/2023 FINDINGS: The patient is rotated to the right on today's  radiograph, reducing diagnostic sensitivity and specificity. Low lung volumes are present, causing crowding of the pulmonary vasculature. Moderate cardiomegaly noted. Obscured left hemidiaphragm compatible with known left pleural effusion and left basilar atelectasis. Lesser degree of indistinctness of the right hemidiaphragm favoring peripheral atelectasis in the right lower lobe. This is roughly similar to the appearance on the CT scan from 09/07/2023. Atherosclerotic calcification of the aortic arch. Degenerative glenohumeral arthropathy. Bony demineralization. IMPRESSION: 1. Moderate cardiomegaly. 2. Left pleural effusion and left basilar atelectasis. 3. Mild right basilar atelectasis. 4. Low lung volumes. 5.  Degenerative glenohumeral arthropathy. 6. Bony demineralization. Electronically Signed   By: Ryan Salvage M.D.   On: 09/23/2023 16:14    {Document cardiac monitor, telemetry assessment procedure when appropriate:32947} .Critical Care  Performed by: Nora Lauraine LABOR, PA-C Authorized by: Nora Lauraine LABOR, PA-C   Critical care provider statement:    Critical care time (minutes):  75   Critical care was necessary to treat or prevent imminent or life-threatening deterioration of the following conditions:  Respiratory failure   Critical care was time spent personally by me on the following activities:  Development of treatment plan with patient or surrogate, discussions with consultants, discussions with primary provider, evaluation of patient's response to treatment, examination of patient, obtaining history from patient or surrogate, ordering and review of laboratory studies, ordering and review of radiographic studies, pulse oximetry, re-evaluation of patient's condition and review of old charts   Care discussed with: admitting provider      Medications Ordered in the ED - No data to display    {Click here for ABCD2, HEART and other calculators REFRESH Note before signing:1}                               Medical Decision Making Amount and/or Complexity of Data Reviewed Labs: ordered. Radiology: ordered.  Risk Prescription drug management.   This patient is a 73 y.o. female who presents to the ED for concern of chest pain, abdominal pain, nausea, vomiting, shortness of breath, this involves an extensive number of treatment options, and is a complaint that carries with it a high risk of complications and morbidity. The emergent differential diagnosis prior to evaluation includes, but is not limited to, sepsis, CHF,   ACS, pericarditis, myocarditis, aortic dissection, PE, pneumothorax, esophageal rupture, pneumonia, reflux/PUD, biliary disease, pancreatitis, costochondritis, anxiety, AAA, gastroenteritis, appendicitis, Bowel obstruction, Bowel perforation. Gastroparesis, DKA, Hernia, Inflammatory bowel disease, mesenteric ischemia, pancreatitis, peritonitis SBP, volvulus.   This is not an exhaustive differential.   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  Additional history: Chart reviewed. Pertinent results include: recently admitted for hypoxic respiratory failure attributed to CHF, was diuresed, then developed urosepsis, discharged to SNF on room air on 9/2  Physical Exam: Physical exam performed. The pertinent findings include: unwell appearing,  vomiting green emesis. On 2 L O2 via [place]. Mild pitting edema BLE, family member at bedside reports this is at her baseline  Lab Tests: I ordered, and personally interpreted labs.  The pertinent results include:  WBC 15.4, BNP 227.9, UA with 5 ketones, noninfectious. Lactic WNL, Troponin WNL   Imaging Studies: I ordered imaging studies including CXR, CT abdomen pelvis. I independently visualized and interpreted imaging which showed   CXR:   1. Moderate cardiomegaly. 2. Left pleural effusion and left basilar atelectasis. 3. Mild right basilar atelectasis. 4. Low lung volumes. 5. Degenerative glenohumeral arthropathy. 6. Bony demineralization.    CT:   HEART AND MEDIASTINUM: Small to moderate pericardial effusion has increased.   IMPRESSION: 1. No acute findings in the abdomen or pelvis.  I agree with the radiologist interpretation.   Cardiac Monitoring:  The patient was maintained on a cardiac monitor.  My attending physician Dr. Freddi viewed and interpreted the cardiac monitored which showed an underlying rhythm of: afib, normal rate, no STEMI. I agree with this interpretation.   Medications: I ordered medication including zofran   for nausea/vomiting. Reevaluation of the patient after these medicines showed that the patient stayed the same. I have reviewed the patients home medicines and have made adjustments as needed.  Consultations Obtained: I requested consultation with the cardiology on call Dr Shlomo,  and discussed lab and imaging findings as well as pertinent plan - they recommend: requested by hospitalist due to pericardial effusion seen on CT.  They will see the patient in consultation.   Disposition: After consideration of the diagnostic results and the patients response to treatment, I feel that patient will require admission for hypoxic respiratory failure as well as intractable nausea and vomiting. After above interventions, patient continues to feel unwell.  Clinically she does not appear significantly fluid overloaded, she is also having borderline soft bps and is vomiting, therefore despite hypoxia will hold off on diuresis at this time. Bedside US  performed by my attending Dr. Freddi to evaluate this new pericardial effusion, please see their note for further information on this. No signs of tamponade. Unclear exact etiology of symptoms, will need further evaluation inpatient.  Discussed patient with hospitalist Dr. Silvester who accepts patient for admission.   This is a shared visit with supervising physician Dr. Freddi who has independently evaluated patient & provided guidance in evaluation/management/disposition, in agreement with care   Final diagnoses:  Acute respiratory failure with hypoxia (HCC)  Intractable nausea and vomiting    ED Discharge Orders     None

## 2023-09-23 NOTE — Subjective & Objective (Signed)
 Patient presents with Nausea vomiting shortness of breath, CP feeling unwell recent admit for UTI and sepsis.   Patient had very similar presentation in end of August found to have UTI and sepsis at the time also was hypoxic  Hypoxia felt to be secondary to fluid overload when her blood pressure improved her Lasix  was restarted

## 2023-09-23 NOTE — Assessment & Plan Note (Signed)
 Appreciate cardiology consult Will order echogram monitor on telemetry no evidence of hemodynamic pericardial effusion at this time

## 2023-09-23 NOTE — Consult Note (Signed)
 Cardiology Consultation   Patient ID: Donna Sexton MRN: [id number]; DOB: 03/22/1950  Admit date: 09/23/2023 Date of Consult: 09/23/2023  PCP:  Alvan Dorothyann BIRCH, MD   [facility] Providers Cardiologist:  Peter Swaziland, MD        Patient Profile: Donna Sexton is a 73 y.o. female with a hx of diastolic HF, AF who is being seen 09/23/2023 for the evaluation of SOB at the request of hospitalist service.  History of Present Illness: [name] presented today with nausea and vomiting.  She was arousable but not very conversant when I was with her in the ED.  Couldn't provide me with much of a history - said she has nausea (and had an emesis bag by her mouth) and abdominal pain.  Denied SOB or chest pain when asked.  On the monitor, she's in Afib with heart rates in the low 100s.  Her recent history notable for recurrent infections, with AF/RVR often complicating these presentations, as well as occasionally some volume overload requiring diuresis.   Past Medical History:  Diagnosis Date   Acute hypoxemic respiratory failure due to COVID-19 [facility]) 03/01/2023   Allergy Morphine   ANEMIA 11/12/2007   Qualifier: Diagnosis of  By: Sherron CMA, Steen     Anxiety    Arthritis    ASTHMA NOS W/ACUTE EXACERBATION 01/13/2010   Qualifier: Diagnosis of  By: Joshua MD, Debby LITTIE.    BIPOLAR DISORDER UNSPECIFIED 10/29/2007   Qualifier: Diagnosis of  By: Joshua MD, Debby LITTIE.    CHF (congestive heart failure) (HCC)    COMMON MIGRAINE 12/29/2008   Qualifier: Diagnosis of  By: Joshua MD, Debby LITTIE.    Encephalopathy due to infection 04/13/2023   GERD 07/28/2009   Qualifier: Diagnosis of  By: Inocencio MD, [place] LABOR    HYPERLIPIDEMIA 10/29/2007   Qualifier: Diagnosis of  By: Joshua MD, Debby LITTIE.    HYPERTENSION, BENIGN ESSENTIAL 10/29/2007   Qualifier: Diagnosis of  By: Joshua MD, Debby LITTIE.    Orbital mass, right 03/13/2023   OSTEOPENIA 10/30/2007   Qualifier: Diagnosis of  By: Joshua  MD, Debby LITTIE.    OVERACTIVE BLADDER 02/21/2008   Qualifier: Diagnosis of  By: Norleen MD, [place] ORN    SLEEP APNEA 12/31/2009   Qualifier: Diagnosis of  By: Joshua MD, Debby LITTIE.    Trigger finger, left little finger 03/15/2017    Past Surgical History:  Procedure Laterality Date   ABDOMINAL HYSTERECTOMY     BREAST IMPLANT REMOVAL Bilateral 2022   BREAST SURGERY  01/10/1978   implants bilateral   CARPAL TUNNEL RELEASE     CHOLECYSTECTOMY     COSMETIC SURGERY     EYE SURGERY  cataract both eyes   HERNIA REPAIR     JOINT REPLACEMENT     PAROTID ENDOSCOPY     SPINE SURGERY  lower back   TUBAL LIGATION         Scheduled Meds:  Continuous Infusions:  PRN Meds:   Allergies:    Allergies  Allergen Reactions   Morphine Nausea And Vomiting    Social History:   Social History   Socioeconomic History   Marital status: Divorced    Spouse name: Not on file   Number of children: 2   Years of education: 9   Highest education level: GED or equivalent  Occupational History   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 She enjoys making wreaths to hang on the doors   Social Drivers of Health   Financial Resource Strain: Low Risk  (06/26/2023)   Overall Financial Resource Strain (CARDIA)    Difficulty of Paying Living Expenses: Not hard at all  Food Insecurity: No Food Insecurity (09/05/2023)   Hunger Vital Sign    Worried About Running Out of Food in the Last Year: Never true    Ran Out of Food in the Last Year: Never true  Recent Concern: Food Insecurity - Food Insecurity Present (06/26/2023)   Hunger Vital Sign    Worried About Running Out of Food  in the Last Year: Often true    Ran Out of Food in the Last Year: Patient declined  Transportation Needs: No Transportation Needs (09/05/2023)   PRAPARE - Administrator, Civil Service (Medical): No    Lack of Transportation (Non-Medical): No  Physical Activity: Inactive (06/26/2023)   Exercise Vital Sign    Days of Exercise per Week: 0 days    Minutes of Exercise per Session: Not on file  Stress: Stress Concern Present (06/26/2023)   [employer] of Occupational Health - Occupational Stress Questionnaire    Feeling of Stress: Very much  Social Connections: Socially Isolated (09/05/2023)   Social Connection and Isolation Panel    Frequency of Communication with Friends and Family: More than three times a week    Frequency of Social Gatherings with Friends and Family: Three times a week    Attends Religious Services: Never    Active Member of Clubs or Organizations: No    Attends Banker Meetings: Never    Marital Status: Divorced  Catering manager Violence: Not At Risk (09/05/2023)   Humiliation, Afraid, Rape, and Kick questionnaire    Fear of Current or Ex-Partner: No    Emotionally Abused: No    Physically Abused: No    Sexually Abused: No    Family History:    Family History  Problem Relation Age of Onset   Heart attack Mother    Hypertension Mother    Heart failure Mother    Anxiety disorder Mother    Heart attack Father    Hypertension Father    Diabetes Father    Arthritis Father    Heart failure Brother    Alcohol abuse Maternal Uncle    Alcohol abuse Maternal Uncle    Breast cancer Neg Hx      ROS:  Please see the history of present illness.   All other ROS reviewed and negative.     Physical Exam/Data: Vitals:   09/23/23 1444 09/23/23 1527 09/23/23 1900 09/23/23 1922  BP: 114/78  99/74   Pulse: 96  (!) 106   Resp: 20  (!) 21   Temp: 97.9 F (36.6 C)   98.1 F (36.7 C)  TempSrc: Oral   Oral  SpO2: 91% 94% 99%    No intake  or output data in the 24 hours ending 09/23/23 1954    09/12/2023    5:00 AM 09/11/2023    5:00 AM 09/10/2023    5:00 AM  Last 3 Weights  Weight (lbs)  231 lb 4.2 oz 233 lb 7.5 oz 236 lb 1.8 oz  Weight (kg) 104.9 kg 105.901 kg 107.1 kg     There is no height or weight on file to calculate BMI.  General:  Well nourished, well developed, lethargic, and in mild acute distress HEENT: normal Neck: unable to visualize JVD Vascular: Distal pulses 1+ bilaterally Cardiac:  normal S1, S2; irregular and mildly tachycardic; no murmur appreciated Lungs:  quiet breath sounds bilaternally, some wheezing Abd: soft, diffusely tender palpation, did not provoke nausea w/ palaption  Ext: 2+ B edema, cool   EKG:  The EKG was personally reviewed and demonstrates:  Afib with diffuse nonspecific t wave abnormalities Telemetry:  Telemetry was personally reviewed and demonstrates:  AF in the 100s  Relevant CV Studies: Preserved EF on Echo in 03-26-23  Laboratory Data: High Sensitivity Troponin:   Recent Labs  Lab 09/05/23 0812 09/23/23 1516 09/23/23 1727  TROPONINIHS 7 3 3      Chemistry Recent Labs  Lab 09/23/23 1516  NA 137  K 3.7  CL 99  CO2 28  GLUCOSE 165*  BUN 13  CREATININE 0.77  CALCIUM  9.2  GFRNONAA >60  ANIONGAP 10    Recent Labs  Lab 09/23/23 1516  PROT 5.9*  ALBUMIN 3.2*  AST 15  ALT 11  ALKPHOS 52  BILITOT 1.2   Hematology Recent Labs  Lab 09/23/23 1516  WBC 15.4*  RBC 3.78*  HGB 12.8  HCT 38.8  MCV 102.6*  MCH 33.9  MCHC 33.0  RDW 13.5  PLT 361   Thyroid  No results for input(s): TSH, FREET4 in the last 168 hours.  BNP Recent Labs  Lab 09/23/23 1516  BNP 227.9*    DDimer No results for input(s): DDIMER in the last 168 hours.  Radiology/Studies:  CT ABDOMEN PELVIS W CONTRAST Result Date: 09/23/2023 EXAM: CT ABDOMEN AND PELVIS WITH CONTRAST 09/23/2023 04:51:42 PM TECHNIQUE: CT of the abdomen and pelvis was performed with the administration of  intravenous contrast (75mL iohexol  (OMNIPAQUE ) 350 MG/ML injection). Multiplanar reformatted images are provided for review. Automated exposure control, iterative reconstruction, and/or weight-based adjustment of the mA/kV was utilized to reduce the radiation dose to as low as reasonably achievable. COMPARISON: 09/07/2023 CLINICAL HISTORY: Abdominal pain, acute, nonlocalized. Chief complaints; Nausea; Emesis; Weakness; Chest Pain. FINDINGS: LOWER CHEST: Small pleural effusions left greater than right as before. Consolidation/atelectasis in the lung bases left greater than right as before. LIVER: Probable cyst in hepatic segment 4a, stable. GALLBLADDER AND BILE DUCTS: Cholecystectomy clips. SPLEEN: No acute abnormality. PANCREAS: No acute abnormality. ADRENAL GLANDS: No acute abnormality. KIDNEYS, URETERS AND BLADDER: No stones in the kidneys or ureters. No hydronephrosis. No perinephric or periureteral stranding. Urinary bladder is nondistended. GI AND BOWEL: Scattered distal descending and sigmoid diverticula without adjacent inflammatory change. Moderate fecal material mildly distends the rectum, without adjacent inflammatory change. There is no bowel obstruction. PERITONEUM AND RETROPERITONEUM: No ascites. No free air. VASCULATURE: Aorta is normal in caliber. LYMPH NODES: No lymphadenopathy. REPRODUCTIVE ORGANS: Post hysterectomy. BONES AND SOFT TISSUES: Postsurgical decompression L4-5. No acute osseous abnormality. No focal soft tissue abnormality. HEART AND MEDIASTINUM: Small to moderate pericardial effusion has increased. IMPRESSION: 1. No acute findings in the abdomen or pelvis. Electronically signed by: Katheleen Faes MD 09/23/2023 04:58 PM EDT RP Workstation: [id number]   DG Chest Portable 1 View Result Date: 09/23/2023 CLINICAL DATA:  Chest pain.  Nausea and emesis with weakness. EXAM: PORTABLE CHEST 1 VIEW COMPARISON:  CT scan 10/08/2023 and chest  radiograph 09/05/2023 FINDINGS: The patient is rotated to  the right on today's radiograph, reducing diagnostic sensitivity and specificity. Low lung volumes are present, causing crowding of the pulmonary vasculature. Moderate cardiomegaly noted. Obscured left hemidiaphragm compatible with known left pleural effusion and left basilar atelectasis. Lesser degree of indistinctness of the right hemidiaphragm favoring peripheral atelectasis in the right lower lobe. This is roughly similar to the appearance on the CT scan from 09/07/2023. Atherosclerotic calcification of the aortic arch. Degenerative glenohumeral arthropathy. Bony demineralization. IMPRESSION: 1. Moderate cardiomegaly. 2. Left pleural effusion and left basilar atelectasis. 3. Mild right basilar atelectasis. 4. Low lung volumes. 5. Degenerative glenohumeral arthropathy. 6. Bony demineralization. Electronically Signed   By: Ryan Salvage M.D.   On: 09/23/2023 16:14     Assessment and Plan: Ill appearing woman with nausea vomiting, elevated WBC and mild hypoxia. Abd CT unrevealing, no hemodynamically significant pericardial effusion noted, and troponins negative.  BNP is actually lower than it was 2 weeks ago.  I suspect there is an underlying infectious etiology to her illness.    From a cardiac standpoint, not much beyond supportive care. - continue the amiodarone  200 (dose is low and should not worsen her nausea) and apixaban . - permissive tachycardia up to the 120s, hold parameters on the diltiazem  if SBP<90 - can hold lasix  for now, but be careful watching her fluid status.   Risk Assessment/Risk Scores:       New [place]  Heart Association (NYHA) Functional Class NYHA Class III  CHA2DS2-VASc Score = 4   This indicates a 4.8% annual risk of stroke. The patient's score is based upon: CHF History: 1 HTN History: 1 Diabetes History: 0 Stroke History: 0 Vascular Disease History: 0 Age Score: 1 Gender Score: 1        For questions or updates, please contact [facility] Please consult [email or web address] for contact info under      Signed, Andee Flatten, MD  09/23/2023 7:54 PM

## 2023-09-23 NOTE — Assessment & Plan Note (Addendum)
-   most likely multifactorial secondary to   mild dehydration secondary to decreased by mouth intake,   - hold off on diuresis  - Hold contributing medications   - if no improvement may need further imaging to evaluate for CNS pathology pathology such as MRI of the brain   - neurological exam appears to be nonfocal but patient unable to cooperate fully   - VBG compensated despite mild  hypercarbia    - no history of liver disease ammonia pending

## 2023-09-23 NOTE — Assessment & Plan Note (Signed)
 Continue diltiazem  p.o. with holding parameters continue amiodarone  200 mg daily continue Eliquis  5 mg p.o. twice daily as per cardiology recommendations

## 2023-09-23 NOTE — Assessment & Plan Note (Addendum)
 Contributing to comorbidity and complicating medical management     Nutritional follow up as an out pt would be recommended

## 2023-09-23 NOTE — ED Triage Notes (Signed)
 Per EMS:  N/V CP Started today Weakness Ongoing for a few days

## 2023-09-24 DIAGNOSIS — G4733 Obstructive sleep apnea (adult) (pediatric): Secondary | ICD-10-CM | POA: Diagnosis not present

## 2023-09-24 DIAGNOSIS — J45909 Unspecified asthma, uncomplicated: Secondary | ICD-10-CM

## 2023-09-24 DIAGNOSIS — I3139 Other pericardial effusion (noninflammatory): Secondary | ICD-10-CM | POA: Diagnosis not present

## 2023-09-24 DIAGNOSIS — G9341 Metabolic encephalopathy: Secondary | ICD-10-CM | POA: Diagnosis not present

## 2023-09-24 DIAGNOSIS — J189 Pneumonia, unspecified organism: Secondary | ICD-10-CM | POA: Diagnosis not present

## 2023-09-24 DIAGNOSIS — J9601 Acute respiratory failure with hypoxia: Secondary | ICD-10-CM | POA: Diagnosis not present

## 2023-09-24 DIAGNOSIS — I4891 Unspecified atrial fibrillation: Secondary | ICD-10-CM | POA: Diagnosis not present

## 2023-09-24 LAB — COMPREHENSIVE METABOLIC PANEL WITH GFR
ALT: 10 U/L (ref 0–44)
AST: 17 U/L (ref 15–41)
Albumin: 3.1 g/dL — ABNORMAL LOW (ref 3.5–5.0)
Alkaline Phosphatase: 51 U/L (ref 38–126)
Anion gap: 16 — ABNORMAL HIGH (ref 5–15)
BUN: 16 mg/dL (ref 8–23)
CO2: 22 mmol/L (ref 22–32)
Calcium: 9.3 mg/dL (ref 8.9–10.3)
Chloride: 99 mmol/L (ref 98–111)
Creatinine, Ser: 0.81 mg/dL (ref 0.44–1.00)
GFR, Estimated: >60 mL/min (ref >60)
Glucose, Bld: 128 mg/dL — ABNORMAL HIGH (ref 70–99)
Potassium: 4.1 mmol/L (ref 3.5–5.1)
Sodium: 137 mmol/L (ref 135–145)
Total Bilirubin: 1.7 mg/dL — ABNORMAL HIGH (ref 0.0–1.2)
Total Protein: 6 g/dL — ABNORMAL LOW (ref 6.5–8.1)

## 2023-09-24 LAB — I-STAT ARTERIAL BLOOD GAS, ED
Acid-Base Excess: 0 mmol/L (ref 0.0–2.0)
Bicarbonate: 26 mmol/L (ref 20.0–28.0)
Calcium, Ion: 1.28 mmol/L (ref 1.15–1.40)
HCT: 36 % (ref 36.0–46.0)
Hemoglobin: 12.2 g/dL (ref 12.0–15.0)
O2 Saturation: 93 %
Potassium: 4.1 mmol/L (ref 3.5–5.1)
Sodium: 136 mmol/L (ref 135–145)
TCO2: 27 mmol/L (ref 22–32)
pCO2 arterial: 45.9 mmHg (ref 32–48)
pH, Arterial: 7.361 (ref 7.35–7.45)
pO2, Arterial: 71 mmHg — ABNORMAL LOW (ref 83–108)

## 2023-09-24 LAB — PREALBUMIN: Prealbumin: 22 mg/dL (ref 18–38)

## 2023-09-24 LAB — CBC
HCT: 41.3 % (ref 36.0–46.0)
Hemoglobin: 13.5 g/dL (ref 12.0–15.0)
MCH: 33.8 pg (ref 26.0–34.0)
MCHC: 32.7 g/dL (ref 30.0–36.0)
MCV: 103.3 fL — ABNORMAL HIGH (ref 80.0–100.0)
Platelets: 338 K/uL (ref 150–400)
RBC: 4 MIL/uL (ref 3.87–5.11)
RDW: 13.7 % (ref 11.5–15.5)
WBC: 17.7 K/uL — ABNORMAL HIGH (ref 4.0–10.5)
nRBC: 0 % (ref 0.0–0.2)

## 2023-09-24 LAB — LIPASE, BLOOD: Lipase: 21 U/L (ref 11–51)

## 2023-09-24 LAB — MRSA NEXT GEN BY PCR, NASAL: MRSA by PCR Next Gen: NOT DETECTED

## 2023-09-24 LAB — LACTIC ACID, PLASMA: Lactic Acid, Venous: 1.5 mmol/L (ref 0.5–1.9)

## 2023-09-24 LAB — TSH: TSH: 2.247 u[IU]/mL (ref 0.350–4.500)

## 2023-09-24 LAB — MAGNESIUM: Magnesium: 1.8 mg/dL (ref 1.7–2.4)

## 2023-09-24 LAB — PHOSPHORUS: Phosphorus: 4.9 mg/dL — ABNORMAL HIGH (ref 2.5–4.6)

## 2023-09-24 MED ORDER — ONDANSETRON HCL 4 MG PO TABS
4.0000 mg | ORAL_TABLET | Freq: Four times a day (QID) | ORAL | Status: DC | PRN
  Administered 2023-09-30 – 2023-10-03 (×5): 4 mg via ORAL
  Filled 2023-09-24 (×3): qty 1

## 2023-09-24 MED ORDER — ACETAMINOPHEN 325 MG PO TABS
650.0000 mg | ORAL_TABLET | Freq: Four times a day (QID) | ORAL | Status: DC | PRN
  Administered 2023-09-24 – 2023-10-03 (×6): 650 mg via ORAL
  Filled 2023-09-24 (×6): qty 2

## 2023-09-24 MED ORDER — MIRABEGRON ER 25 MG PO TB24
25.0000 mg | ORAL_TABLET | Freq: Every day | ORAL | Status: DC
Start: 2023-09-24 — End: 2023-10-04
  Administered 2023-09-24 – 2023-10-03 (×10): 25 mg via ORAL
  Filled 2023-09-24 (×11): qty 1

## 2023-09-24 MED ORDER — HYDROCODONE-ACETAMINOPHEN 5-325 MG PO TABS
1.0000 | ORAL_TABLET | ORAL | Status: DC | PRN
  Administered 2023-09-24 – 2023-09-26 (×3): 2 via ORAL
  Administered 2023-09-27 – 2023-09-29 (×4): 1 via ORAL
  Filled 2023-09-24 (×2): qty 1
  Filled 2023-09-24: qty 2
  Filled 2023-09-24: qty 1
  Filled 2023-09-24: qty 2
  Filled 2023-09-24: qty 1
  Filled 2023-09-24: qty 2

## 2023-09-24 MED ORDER — SODIUM CHLORIDE 0.9 % IV SOLN: INTRAVENOUS | Status: DC

## 2023-09-24 MED ORDER — BISACODYL 10 MG RE SUPP
10.0000 mg | Freq: Every day | RECTAL | Status: DC | PRN
  Administered 2023-09-25: 10 mg via RECTAL
  Filled 2023-09-24: qty 1

## 2023-09-24 MED ORDER — METOCLOPRAMIDE HCL 5 MG/ML IJ SOLN
5.0000 mg | Freq: Four times a day (QID) | INTRAMUSCULAR | Status: DC | PRN
  Administered 2023-09-24 – 2023-09-25 (×2): 5 mg via INTRAVENOUS
  Filled 2023-09-24 (×3): qty 2

## 2023-09-24 MED ORDER — FLUTICASONE FUROATE-VILANTEROL 100-25 MCG/ACT IN AEPB
1.0000 | INHALATION_SPRAY | Freq: Every day | RESPIRATORY_TRACT | Status: DC
  Administered 2023-09-24 – 2023-10-04 (×6): 1 via RESPIRATORY_TRACT
  Filled 2023-09-24 (×2): qty 28

## 2023-09-24 MED ORDER — POLYETHYLENE GLYCOL 3350 17 G PO PACK
17.0000 g | PACK | Freq: Every day | ORAL | Status: DC
  Administered 2023-09-24 – 2023-09-28 (×4): 17 g via ORAL
  Filled 2023-09-24 (×9): qty 1

## 2023-09-24 MED ORDER — AMIODARONE HCL 200 MG PO TABS: 100.0000 mg | ORAL_TABLET | Freq: Every day | ORAL | Status: DC

## 2023-09-24 MED ORDER — DILTIAZEM HCL ER COATED BEADS 120 MG PO CP24
120.0000 mg | ORAL_CAPSULE | Freq: Every day | ORAL | Status: DC
Start: 2023-09-24 — End: 2023-09-27
  Administered 2023-09-24 – 2023-09-27 (×4): 120 mg via ORAL
  Filled 2023-09-24 (×4): qty 1

## 2023-09-24 MED ORDER — DIVALPROEX SODIUM ER 500 MG PO TB24
1500.0000 mg | ORAL_TABLET | Freq: Every day | ORAL | Status: DC
  Administered 2023-09-24 – 2023-10-03 (×10): 1500 mg via ORAL
  Filled 2023-09-24 (×11): qty 3

## 2023-09-24 MED ORDER — GUAIFENESIN ER 600 MG PO TB12
600.0000 mg | ORAL_TABLET | Freq: Two times a day (BID) | ORAL | Status: DC
Start: 2023-09-24 — End: 2023-10-04
  Administered 2023-09-24 – 2023-10-03 (×20): 600 mg via ORAL
  Filled 2023-09-24 (×22): qty 1

## 2023-09-24 MED ORDER — ATORVASTATIN CALCIUM 40 MG PO TABS
40.0000 mg | ORAL_TABLET | Freq: Every day | ORAL | Status: DC
  Administered 2023-09-24 – 2023-10-03 (×10): 40 mg via ORAL
  Filled 2023-09-24 (×11): qty 1

## 2023-09-24 MED ORDER — METOPROLOL TARTRATE 12.5 MG HALF TABLET: 12.5000 mg | ORAL_TABLET | Freq: Two times a day (BID) | ORAL | Status: DC

## 2023-09-24 MED ORDER — SENNOSIDES-DOCUSATE SODIUM 8.6-50 MG PO TABS
2.0000 | ORAL_TABLET | Freq: Every day | ORAL | Status: DC
  Administered 2023-09-24 – 2023-10-03 (×9): 2 via ORAL
  Filled 2023-09-24 (×9): qty 2

## 2023-09-24 MED ORDER — IPRATROPIUM-ALBUTEROL 0.5-2.5 (3) MG/3ML IN SOLN: 3.0000 mL | RESPIRATORY_TRACT | Status: DC | PRN

## 2023-09-24 MED ORDER — METOPROLOL TARTRATE 25 MG PO TABS
25.0000 mg | ORAL_TABLET | Freq: Two times a day (BID) | ORAL | Status: DC
Start: 2023-09-24 — End: 2023-10-04
  Administered 2023-09-24 – 2023-10-03 (×21): 25 mg via ORAL
  Filled 2023-09-24 (×22): qty 1

## 2023-09-24 MED ORDER — FAMOTIDINE 20 MG PO TABS
20.0000 mg | ORAL_TABLET | Freq: Every day | ORAL | Status: DC
  Administered 2023-09-24 – 2023-10-03 (×9): 20 mg via ORAL
  Filled 2023-09-24 (×11): qty 1

## 2023-09-24 MED ORDER — ONDANSETRON HCL 4 MG/2ML IJ SOLN
4.0000 mg | Freq: Four times a day (QID) | INTRAMUSCULAR | Status: DC | PRN
  Administered 2023-09-25 – 2023-10-04 (×14): 4 mg via INTRAVENOUS
  Filled 2023-09-24 (×15): qty 2

## 2023-09-24 MED ORDER — APIXABAN 5 MG PO TABS
5.0000 mg | ORAL_TABLET | Freq: Two times a day (BID) | ORAL | Status: DC
Start: 2023-09-24 — End: 2023-10-04
  Administered 2023-09-24 – 2023-10-03 (×20): 5 mg via ORAL
  Filled 2023-09-24 (×22): qty 1

## 2023-09-24 MED ORDER — ACETAMINOPHEN 650 MG RE SUPP: 650.0000 mg | Freq: Four times a day (QID) | RECTAL | Status: DC | PRN

## 2023-09-24 MED ORDER — BUDESONIDE 0.25 MG/2ML IN SUSP
0.2500 mg | Freq: Two times a day (BID) | RESPIRATORY_TRACT | Status: DC
  Administered 2023-09-24 – 2023-10-04 (×20): 0.25 mg via RESPIRATORY_TRACT
  Filled 2023-09-24 (×22): qty 2

## 2023-09-24 NOTE — Evaluation (Signed)
 Physical Therapy Evaluation Patient Details Name: Donna Sexton MRN: [id number] DOB: January 07, 1951 Today's Date: 09/24/2023  History of Present Illness  Pt is a 73 y.o. female presenting 9/13 with chest pain, confusion, shortness of breath, N/V. Admitted for acute respiratory failure with hypoxia, metabolic encephalopathy. CT reveals Left pleural effusion and left basilar atelectasis.  Recent hospitalization 8/26-9/2 for UTI/sepsis.  PMH: bipolar disorder, anemia, anxiety, asthma GERD, IBS, HLD, HTN, osteopenia, overactive bladder, sleep apnea, prediabetes, recurrent UTIs, paroxysmal atrial fibrillation, chronic diastolic CHF   Clinical Impression  Pt presents with condition above and deficits mentioned below, see PT Problem List. PTA, she was most recently at a SNF working with therapy on walking with a RW. The pt has had multiple admissions and health concerns since February. Prior to that, she was independent without DME, driving, and living alone. Currently, the pt is very lethargic, following < 10 % of commands, and demonstrating poor initiation to assist with all functional mobility. She is currently requiring total assist x2 for all bed mobility aspects. Deferred attempts at OOB mobility for pt safety due to poor level of arousal and command following this date. She demonstrates deficits in generalized strength, balance, power, cognition, and endurance. She would benefit from further rehab at her short-term inpatient rehab facility, < 3 hours/day. Will continue to follow acutely.      If plan is discharge home, recommend the following: Two people to help with walking and/or transfers;Two people to help with bathing/dressing/bathroom;Assistance with cooking/housework;Assistance with feeding;Direct supervision/assist for medications management;Direct supervision/assist for financial management;Assist for transportation;Help with stairs or ramp for entrance;Supervision due to cognitive status   Can  travel by private vehicle   No    Equipment Recommendations Hospital bed;Hoyer lift  Recommendations for Other Services  Other (comment);Speech consult (Palliative Consult)    Functional Status Assessment Patient has had a recent decline in their functional status and demonstrates the ability to make significant improvements in function in a reasonable and predictable amount of time.     Precautions / Restrictions Precautions Precautions: Fall Recall of Precautions/Restrictions: Impaired Restrictions Weight Bearing Restrictions Per Provider Order: No      Mobility  Bed Mobility Overal bed mobility: Needs Assistance Bed Mobility: Supine to Sit, Sit to Supine, Rolling Rolling: Total assist   Supine to sit: Total assist, +2 for physical assistance, +2 for safety/equipment, HOB elevated Sit to supine: Total assist, +2 for physical assistance, +2 for safety/equipment   General bed mobility comments: Max cues provided for pt to assist in moving legs off L EOB with min success noted. Cues provided for pt to grab L bed rail with R UE, but no initiation noted. Pt required total assist x2 to transition supine <> sit L EOB and roll to R 1x due to limited if any initiation by pt despite max cues and extra time to allow pt to engage.    Transfers                   General transfer comment: deferred due to low level of arousal this date    Ambulation/Gait               General Gait Details: deferred due to low level of arousal this date  Stairs            Wheelchair Mobility     Tilt Bed    Modified Rankin (Stroke Patients Only)       Balance Overall balance assessment: Needs assistance Sitting-balance support:  No upper extremity supported, Feet supported, Bilateral upper extremity supported Sitting balance-Leahy Scale: Poor Sitting balance - Comments: Pt leans posteriorly initially requiring modA with pt initermittently activating to flex trunk and  progressing to CGA for up to ~15 seconds, but as pt fatigued she began to lean posteriorly and laterally to the L more and needed maxA Postural control: Posterior lean, Left lateral lean     Standing balance comment: deferred due to low level of arousal this date                             Pertinent Vitals/Pain Pain Assessment Pain Assessment: Faces Faces Pain Scale: Hurts little more Pain Location: generalized Pain Descriptors / Indicators: Discomfort, Grimacing, Guarding, Moaning Pain Intervention(s): Limited activity within patient's tolerance, Monitored during session, Repositioned    Home Living Family/patient expects to be discharged to:: Private residence Living Arrangements: Alone Available Help at Discharge: Personal care attendant;Family;Available PRN/intermittently (comes out most days morning and evenings to assist with dressing; son lives a few doors down) Type of Home: House Home Access: Level entry       Home Layout: Two level;Able to live on main level with bedroom/bathroom Home Equipment: Wheelchair - Event organiser (2 wheels);Rollator (4 wheels);Shower seat;Grab bars - tub/shower;BSC/3in1 (suction cup grab bars)      Prior Function Prior Level of Function : Needs assist             Mobility Comments: Pt was working with PT at [facility] on walking with a RW before last admission. When pt was living at home before February, she was independent without AD ADLs Comments: Before February, pt was independent and driving. Has been working with OT on ADLs at SNF recently     Extremity/Trunk Assessment   Upper Extremity Assessment Upper Extremity Assessment: Defer to OT evaluation    Lower Extremity Assessment Lower Extremity Assessment: Generalized weakness;Difficult to assess due to impaired cognition (difficult to assess due to lethargy)    Cervical / Trunk Assessment Cervical / Trunk Assessment: Other exceptions (forward  head) Cervical / Trunk Exceptions: forward head  Communication   Communication Communication: Impaired Factors Affecting Communication: Other (comment) (did not speak much this session due to lethargy)    Cognition Arousal: Lethargic Behavior During Therapy: Flat affect   PT - Cognitive impairments: Difficult to assess Difficult to assess due to: Level of arousal                     PT - Cognition Comments: Pt lethargic, but able to open eyes and stay awake with stimuli, still falling asleep intermittently though when sitting EOB. Pt displayed poor initiation with all mobility and followed < 10% of commands with extra time. Following commands: Impaired Following commands impaired: Follows one step commands inconsistently, Follows one step commands with increased time     Cueing Cueing Techniques: Verbal cues, Tactile cues, Gestural cues     General Comments General comments (skin integrity, edema, etc.): VSS on 2L    Exercises Other Exercises Other Exercises: While sitting EOB, attempted IS > 3 reps but poor initiation by pt   Assessment/Plan    PT Assessment Patient needs continued PT services  PT Problem List Decreased strength;Decreased activity tolerance;Decreased balance;Decreased mobility;Decreased coordination;Decreased cognition;Decreased knowledge of use of DME;Decreased safety awareness;Cardiopulmonary status limiting activity       PT Treatment Interventions DME instruction;Gait training;Functional mobility training;Therapeutic activities;Therapeutic exercise;Neuromuscular re-education;Balance training;Cognitive remediation;Patient/family education;Wheelchair mobility  training    PT Goals (Current goals can be found in the Care Plan section)  Acute Rehab PT Goals Patient Stated Goal: to get better PT Goal Formulation: With patient/family Time For Goal Achievement: 10/08/23 Potential to Achieve Goals: Fair    Frequency Min 2X/week     Co-evaluation    Reason for Co-Treatment: Complexity of the patient's impairments (multi-system involvement);For patient/therapist safety;To address functional/ADL transfers PT goals addressed during session: Mobility/safety with mobility;Balance;Proper use of DME OT goals addressed during session: ADL's and self-care       AM-PAC PT 6 Clicks Mobility  Outcome Measure Help needed turning from your back to your side while in a flat bed without using bedrails?: Total Help needed moving from lying on your back to sitting on the side of a flat bed without using bedrails?: Total Help needed moving to and from a bed to a chair (including a wheelchair)?: Total Help needed standing up from a chair using your arms (e.g., wheelchair or bedside chair)?: Total Help needed to walk in hospital room?: Total Help needed climbing 3-5 steps with a railing? : Total 6 Click Score: 6    End of Session Equipment Utilized During Treatment: Oxygen Activity Tolerance: Patient limited by lethargy Patient left: in bed;with call bell/phone within reach;with bed alarm set;with family/visitor present Nurse Communication: Mobility status;Other (comment) (family requesting pt not receive high level pain meds; pt could benefit from SCDs) PT Visit Diagnosis: Unsteadiness on feet (R26.81);Muscle weakness (generalized) (M62.81);Difficulty in walking, not elsewhere classified (R26.2)    Time: 8564-8540 PT Time Calculation (min) (ACUTE ONLY): 24 min   Charges:   PT Evaluation $PT Eval Moderate Complexity: 1 Mod   PT General Charges $$ ACUTE PT VISIT: 1 Visit         Theo Ferretti, PT, DPT Acute Rehabilitation Services  Office: [phone]   Theo CHRISTELLA Ferretti 09/24/2023, 3:47 PM

## 2023-09-24 NOTE — Progress Notes (Signed)
 PROGRESS NOTE    Donna Sexton  FMW:[id number] DOB: 05/12/1950 DOA: 09/23/2023 PCP: Alvan Dorothyann BIRCH, MD   Brief Narrative:   73 y.o. female with medical history significant of bipolar disorder anemia and anxiety asthma GERD, IBS, HL,D HTN, osteopenia overactive bladder, sleep apnea, prediabetes, recurrent UTIs ,paroxysmal atrial fibrillation ,chronic diastolic CHF, Presented with nausea, vomiting and altered confusion.  Assessment & Plan:  Principal Problem:   Acute respiratory failure with hypoxia (HCC) Active Problems:   Bipolar disorder (HCC)   Hyperlipidemia   Essential hypertension   Chronic heart failure with preserved ejection fraction (HCC)   Atrial fibrillation with rapid ventricular response (HCC)   CAP (community acquired pneumonia)   Constipation   Class 3 obesity   Acute metabolic encephalopathy   Pericardial effusion   Acute metabolic encephalopathy, POA:    - most likely multifactorial secondary to   mild dehydration secondary to decreased by mouth intake,  -CT head didn't show any acute abnormalities  - hold off on diuresis  - Hold contributing medications   - No focal neuro deficits.      Acute respiratory failure with hypoxia,POA:  Chest x-ray showing left pleural effusion and left basilar atelectasis right mild right basilar atelectasis and low lung volumes Patient is currently on anticoagulation, continue Eliquis  CT showed Consolidation/atelectasis in the lung bases left greater than right as before. Noted white blood cell count of 15 Treat for possible underlying pneumonia       Atrial fibrillation with rapid ventricular response, POA:  Continue diltiazem  p.o. with holding parameters  continue amiodarone  200 mg daily  continue Eliquis  5 mg p.o. twice daily as per cardiology recommendations Cardiology on board   Bipolar disorder  Continue home medications   Chronic heart failure with preserved ejection fraction Currently actually  appears to be somewhat euvolemic   Class 3 obesity -Contributing to comorbidity and complicating medical management          Constipation Bowel regimen ordered   Essential hypertension Prn antihypertensives   Hyperlipidemia Continue Lipitor 40 mg daily   CAP (community acquired pneumonia),possible bacterial CT abdomen actually showed evidence of possible consolidation given elevated white blood cell count and new hypoxia will treat for pneumonia given recent admission and course of Rocephin  while hospitalized with persistent consolidation will change of cefepime  vancomycin  check MRSA sputum cultures Legionella strep pneumo check procalcitonin ordered flutter valve   Pericardial effusion Appreciate cardiology consult  Disposition: Lives at a rehab facility   DVT prophylaxis: SCDs Start: 09/24/23 0152 apixaban  (ELIQUIS ) tablet 5 mg     Code Status: Full Code Family Communication:  Ex-husband at the bedside Status is: Inpatient Remains inpatient appropriate because: AMS, Sepsis    Subjective:  She appears drowsy but wakes up to commands. Ex-husband is present at the bedside.  Examination:  General exam: Appears drowsy, able to responds to most questions Respiratory system: Clear to auscultation. Respiratory effort normal. Cardiovascular system: S1 & S2 heard, RRR. No JVD, murmurs, rubs, gallops or clicks. No pedal edema. Gastrointestinal system: Abdomen is nondistended, soft and nontender. No organomegaly or masses felt. Normal bowel sounds heard. Central nervous system: Alert and oriented. No focal neurological deficits. Extremities: Symmetric 5 x 5 power. Skin: No rashes, lesions or ulcers Psychiatry: Judgement and insight appear normal. Mood & affect appropriate.       Diet Orders (From admission, onward)     Start     Ordered   09/24/23 0152  Diet clear liquid Room service appropriate? Yes;  Fluid consistency: Thin  Diet effective now       Question Answer  Comment  Room service appropriate? Yes   Fluid consistency: Thin      09/24/23 0151            Objective: Vitals:   09/24/23 0743 09/24/23 0800 09/24/23 0804 09/24/23 0837  BP: 121/79 107/67    Pulse: (!) 110 (!) 108  77  Resp: 20 (!) 27  17  Temp: 99.3 F (37.4 C)     TempSrc: Axillary     SpO2: 94% 96% 96% 94%    Intake/Output Summary (Last 24 hours) at 09/24/2023 1034 Last data filed at 09/24/2023 0434 Gross per 24 hour  Intake 473.84 ml  Output --  Net 473.84 ml   There were no vitals filed for this visit.  Scheduled Meds:  amiodarone   200 mg Oral Daily   apixaban   5 mg Oral BID   atorvastatin   40 mg Oral Daily   budesonide  (PULMICORT ) nebulizer solution  0.25 mg Nebulization BID   diltiazem   120 mg Oral Daily   famotidine   20 mg Oral Daily   fluticasone  furoate-vilanterol  1 puff Inhalation Daily   guaiFENesin   600 mg Oral BID   metoprolol  tartrate  25 mg Oral BID   mirabegron  ER  25 mg Oral QHS   polyethylene glycol  17 g Oral Daily   senna-docusate  2 tablet Oral QHS   Continuous Infusions:  sodium chloride  50 mL/hr at 09/24/23 0305   ceFEPime  (MAXIPIME ) IV 2 g (09/24/23 0434)   sodium chloride      vancomycin       Nutritional status     There is no height or weight on file to calculate BMI.  Data Reviewed:   CBC: Recent Labs  Lab 09/23/23 1516 09/24/23 0103 09/24/23 0252  WBC 15.4*  --  17.7*  NEUTROABS 11.9*  --   --   HGB 12.8 12.2 13.5  HCT 38.8 36.0 41.3  MCV 102.6*  --  103.3*  PLT 361  --  338   Basic Metabolic Panel: Recent Labs  Lab 09/23/23 1516 09/23/23 2050 09/24/23 0103 09/24/23 0252  NA 137  --  136 137  K 3.7  --  4.1 4.1  CL 99  --   --  99  CO2 28  --   --  22  GLUCOSE 165*  --   --  128*  BUN 13  --   --  16  CREATININE 0.77  --   --  0.81  CALCIUM  9.2  --   --  9.3  MG  --  1.7  --  1.8  PHOS  --  4.5  --  4.9*   GFR: Estimated Creatinine Clearance: 72 mL/min (by C-G formula based on SCr of 0.81  mg/dL). Liver Function Tests: Recent Labs  Lab 09/23/23 1516 09/24/23 0252  AST 15 17  ALT 11 10  ALKPHOS 52 51  BILITOT 1.2 1.7*  PROT 5.9* 6.0*  ALBUMIN 3.2* 3.1*   Recent Labs  Lab 09/23/23 2234  LIPASE 21   No results for input(s): AMMONIA in the last 168 hours. Coagulation Profile: No results for input(s): INR, PROTIME in the last 168 hours. Cardiac Enzymes: Recent Labs  Lab 09/23/23 2050  CKTOTAL 12*   BNP (last 3 results) No results for input(s): PROBNP in the last 8760 hours. HbA1C: No results for input(s): HGBA1C in the last 72 hours. CBG: No results for input(s): GLUCAP  in the last 168 hours. Lipid Profile: No results for input(s): CHOL, HDL, LDLCALC, TRIG, CHOLHDL, LDLDIRECT in the last 72 hours. Thyroid  Function Tests: Recent Labs    09/24/23 0252  TSH 2.247   Anemia Panel: No results for input(s): VITAMINB12, FOLATE, FERRITIN, TIBC, IRON, RETICCTPCT in the last 72 hours. Sepsis Labs: Recent Labs  Lab 09/23/23 1523 09/23/23 2050 09/24/23 0733  PROCALCITON  --  0.16  --   LATICACIDVEN 1.3  --  1.5    Recent Results (from the past 240 hours)  Resp panel by RT-PCR (RSV, Flu A&B, Covid) Anterior Nasal Swab     Status: None   Collection Time: 09/23/23  7:48 PM   Specimen: Anterior Nasal Swab  Result Value Ref Range Status   SARS Coronavirus 2 by RT PCR NEGATIVE NEGATIVE Final   Influenza A by PCR NEGATIVE NEGATIVE Final   Influenza B by PCR NEGATIVE NEGATIVE Final    Comment: (NOTE) The Xpert Xpress SARS-CoV-2/FLU/RSV plus assay is intended as an aid in the diagnosis of influenza from Nasopharyngeal swab specimens and should not be used as a sole basis for treatment. Nasal washings and aspirates are unacceptable for Xpert Xpress SARS-CoV-2/FLU/RSV testing.  Fact Sheet for Patients: BloggerCourse.com  Fact Sheet for Healthcare  Providers: SeriousBroker.it  This test is not yet approved or cleared by the [place]  FDA and has been authorized for detection and/or diagnosis of SARS-CoV-2 by FDA under an Emergency Use Authorization (EUA). This EUA will remain in effect (meaning this test can be used) for the duration of the COVID-19 declaration under Section 564(b)(1) of the Act, 21 U.S.C. section 360bbb-3(b)(1), unless the authorization is terminated or revoked.     Resp Syncytial Virus by PCR NEGATIVE NEGATIVE Final    Comment: (NOTE) Fact Sheet for Patients: BloggerCourse.com  Fact Sheet for Healthcare Providers: SeriousBroker.it  This test is not yet approved or cleared by the [place]  FDA and has been authorized for detection and/or diagnosis of SARS-CoV-2 by FDA under an Emergency Use Authorization (EUA). This EUA will remain in effect (meaning this test can be used) for the duration of the COVID-19 declaration under Section 564(b)(1) of the Act, 21 U.S.C. section 360bbb-3(b)(1), unless the authorization is terminated or revoked.  Performed at [facility], [address], [place], [place] [address]   Respiratory (~20 pathogens) panel by PCR     Status: None   Collection Time: 09/23/23  7:48 PM   Specimen: Nasopharyngeal Swab; Respiratory  Result Value Ref Range Status   Adenovirus NOT DETECTED NOT DETECTED Final   Coronavirus 229E NOT DETECTED NOT DETECTED Final    Comment: (NOTE) The Coronavirus on the Respiratory Panel, DOES NOT test for the novel  Coronavirus (2019 nCoV)    Coronavirus HKU1 NOT DETECTED NOT DETECTED Final   Coronavirus NL63 NOT DETECTED NOT DETECTED Final   Coronavirus OC43 NOT DETECTED NOT DETECTED Final   Metapneumovirus NOT DETECTED NOT DETECTED Final   Rhinovirus / Enterovirus NOT DETECTED NOT DETECTED Final   Influenza A NOT DETECTED NOT DETECTED Final   Influenza B NOT DETECTED  NOT DETECTED Final   Parainfluenza Virus 1 NOT DETECTED NOT DETECTED Final   Parainfluenza Virus 2 NOT DETECTED NOT DETECTED Final   Parainfluenza Virus 3 NOT DETECTED NOT DETECTED Final   Parainfluenza Virus 4 NOT DETECTED NOT DETECTED Final   Respiratory Syncytial Virus NOT DETECTED NOT DETECTED Final   Bordetella pertussis NOT DETECTED NOT DETECTED Final   Bordetella Parapertussis NOT  DETECTED NOT DETECTED Final   Chlamydophila pneumoniae NOT DETECTED NOT DETECTED Final   Mycoplasma pneumoniae NOT DETECTED NOT DETECTED Final    Comment: Performed at [facility], [address], [place], [place] [address]  Culture, blood (routine x 2) Call MD if unable to obtain prior to antibiotics being given     Status: None (Preliminary result)   Collection Time: 09/23/23  9:25 PM   Specimen: BLOOD RIGHT FOREARM  Result Value Ref Range Status   Specimen Description BLOOD RIGHT FOREARM  Final   Special Requests   Final    BOTTLES DRAWN AEROBIC AND ANAEROBIC Blood Culture results may not be optimal due to an inadequate volume of blood received in culture bottles   Culture   Final    NO GROWTH < 12 HOURS Performed at [facility], [address], [place], [place] [address]    Report Status PENDING  Incomplete  Culture, blood (routine x 2) Call MD if unable to obtain prior to antibiotics being given     Status: None (Preliminary result)   Collection Time: 09/23/23  9:30 PM   Specimen: BLOOD  Result Value Ref Range Status   Specimen Description BLOOD LEFT ANTECUBITAL  Final   Special Requests   Final    BOTTLES DRAWN AEROBIC AND ANAEROBIC Blood Culture results may not be optimal due to an inadequate volume of blood received in culture bottles   Culture   Final    NO GROWTH < 12 HOURS Performed at [facility], [address] [place], [place] [address]    Report Status PENDING  Incomplete  MRSA Next Gen by PCR, Nasal     Status: None   Collection Time: 09/23/23 10:41 PM    Specimen: Nasopharyngeal Swab; Nasal Swab  Result Value Ref Range Status   MRSA by PCR Next Gen NOT DETECTED NOT DETECTED Final    Comment: (NOTE) The GeneXpert MRSA Assay (FDA approved for NASAL specimens only), is one component of a comprehensive MRSA colonization surveillance program. It is not intended to diagnose MRSA infection nor to guide or monitor treatment for MRSA infections. Test performance is not FDA approved in patients less than 29 years old. Performed at [facility], [address] [place], [place] [address]          Radiology Studies: CT HEAD WO CONTRAST ( ) Result Date: 09/23/2023 CLINICAL DATA:  Altered mental status EXAM: CT HEAD WITHOUT CONTRAST TECHNIQUE: Contiguous axial images were obtained from the base of the skull through the vertex without intravenous contrast. RADIATION DOSE REDUCTION: This exam was performed according to the departmental dose-optimization program which includes automated exposure control, adjustment of the mA and/or kV according to patient size and/or use of iterative reconstruction technique. COMPARISON:  09/05/2023 FINDINGS: Brain: No evidence of acute infarction, hemorrhage, hydrocephalus, extra-axial collection or mass lesion/mass effect. Chronic atrophic and ischemic changes are noted. Vascular: No hyperdense vessel or unexpected calcification. Skull: Normal. Negative for fracture or focal lesion. Sinuses/Orbits: No acute finding. Other: None. IMPRESSION: Chronic atrophic and ischemic changes without acute abnormality. Electronically Signed   By: Oneil Devonshire M.D.   On: 09/23/2023 23:14   CT ABDOMEN PELVIS W CONTRAST Result Date: 09/23/2023 EXAM: CT ABDOMEN AND PELVIS WITH CONTRAST 09/23/2023 04:51:42 PM TECHNIQUE: CT of the abdomen and pelvis was performed with the administration of intravenous contrast (75mL iohexol  (OMNIPAQUE ) 350 MG/ML injection). Multiplanar reformatted images are provided for review. Automated exposure  control, iterative reconstruction,  and/or weight-based adjustment of the mA/kV was utilized to reduce the radiation dose to as low as reasonably achievable. COMPARISON: 09/07/2023 CLINICAL HISTORY: Abdominal pain, acute, nonlocalized. Chief complaints; Nausea; Emesis; Weakness; Chest Pain. FINDINGS: LOWER CHEST: Small pleural effusions left greater than right as before. Consolidation/atelectasis in the lung bases left greater than right as before. LIVER: Probable cyst in hepatic segment 4a, stable. GALLBLADDER AND BILE DUCTS: Cholecystectomy clips. SPLEEN: No acute abnormality. PANCREAS: No acute abnormality. ADRENAL GLANDS: No acute abnormality. KIDNEYS, URETERS AND BLADDER: No stones in the kidneys or ureters. No hydronephrosis. No perinephric or periureteral stranding. Urinary bladder is nondistended. GI AND BOWEL: Scattered distal descending and sigmoid diverticula without adjacent inflammatory change. Moderate fecal material mildly distends the rectum, without adjacent inflammatory change. There is no bowel obstruction. PERITONEUM AND RETROPERITONEUM: No ascites. No free air. VASCULATURE: Aorta is normal in caliber. LYMPH NODES: No lymphadenopathy. REPRODUCTIVE ORGANS: Post hysterectomy. BONES AND SOFT TISSUES: Postsurgical decompression L4-5. No acute osseous abnormality. No focal soft tissue abnormality. HEART AND MEDIASTINUM: Small to moderate pericardial effusion has increased. IMPRESSION: 1. No acute findings in the abdomen or pelvis. Electronically signed by: Katheleen Faes MD 09/23/2023 04:58 PM EDT RP Workstation: [id number]   DG Chest Portable 1 View Result Date: 09/23/2023 CLINICAL DATA:  Chest pain.  Nausea and emesis with weakness. EXAM: PORTABLE CHEST 1 VIEW COMPARISON:  CT scan 10/08/2023 and chest radiograph 09/05/2023 FINDINGS: The patient is rotated to the right on today's radiograph, reducing diagnostic sensitivity and specificity. Low lung volumes are present, causing crowding of the  pulmonary vasculature. Moderate cardiomegaly noted. Obscured left hemidiaphragm compatible with known left pleural effusion and left basilar atelectasis. Lesser degree of indistinctness of the right hemidiaphragm favoring peripheral atelectasis in the right lower lobe. This is roughly similar to the appearance on the CT scan from 09/07/2023. Atherosclerotic calcification of the aortic arch. Degenerative glenohumeral arthropathy. Bony demineralization. IMPRESSION: 1. Moderate cardiomegaly. 2. Left pleural effusion and left basilar atelectasis. 3. Mild right basilar atelectasis. 4. Low lung volumes. 5. Degenerative glenohumeral arthropathy. 6. Bony demineralization. Electronically Signed   By: Ryan Salvage M.D.   On: 09/23/2023 16:14           LOS: 1 day   Time spent= 35 mins    Deliliah Room, MD [facility]  If 7PM-7AM, please contact night-coverage  09/24/2023, 10:34 AM

## 2023-09-24 NOTE — Progress Notes (Signed)
 Pt arrived on the unit from ED, alert. Vital  signs within normal. Pt alert, but lethargic.Pt admitted to tele.

## 2023-09-24 NOTE — Evaluation (Signed)
 Occupational Therapy Evaluation Patient Details Name: SHENIQUA CAROLAN MRN: [id number] DOB: 01/09/1951 Today's Date: 09/24/2023   History of Present Illness   Pt is a 73 y.o. female presenting 9/13 with chest pain, confusion, shortness of breath, N/V. Admitted for acute respiratory failure with hypoxia, metabolic encephalopathy. CT reveals Left pleural effusion and left basilar atelectasis.  Recent hospitalization 8/26-9/2 for UTI/sepsis.  PMH: bipolar disorder, anemia, anxiety, asthma GERD, IBS, HLD, HTN, osteopenia, overactive bladder, sleep apnea, prediabetes, recurrent UTIs, paroxysmal atrial fibrillation, chronic diastolic CHF     Clinical Impressions PTA, pt recently at [facility] and in and out of Hospital since February and ex husband present reports working on transfers with RW and working on ADL with assist. Prior to February, living alone and independent. Upon eval, pt remains lethargic throughout session with minimal initiation of tasks and ultimately needing total A for bed mobility, attempts at IS sitting EOB with varying assist levels for balance. Pt following <10% of commands; per family in room, pt does not react wekk and lethargic with hydrocodone , but per RN, pt has not had any and RN reports MD taking pt off psych medications to optimize level of arousal, so hopeful for further cog assessment next session. Will continue to follow. Patient will benefit from continued inpatient follow up therapy, <3 hours/day      If plan is discharge home, recommend the following:   Two people to help with walking and/or transfers;Two people to help with bathing/dressing/bathroom;Assistance with feeding;Direct supervision/assist for medications management;Direct supervision/assist for financial management;Assist for transportation;Help with stairs or ramp for entrance;Supervision due to cognitive status     Functional Status Assessment   Patient has had a recent decline in their functional status  and demonstrates the ability to make significant improvements in function in a reasonable and predictable amount of time.     Equipment Recommendations   None recommended by OT     Recommendations for Other Services         Precautions/Restrictions   Precautions Precautions: Fall Recall of Precautions/Restrictions: Impaired Restrictions Weight Bearing Restrictions Per Provider Order: No     Mobility Bed Mobility Overal bed mobility: Needs Assistance Bed Mobility: Supine to Sit, Sit to Supine, Rolling Rolling: Total assist   Supine to sit: Total assist, +2 for physical assistance, +2 for safety/equipment, HOB elevated Sit to supine: Total assist, +2 for physical assistance, +2 for safety/equipment   General bed mobility comments: Max cues provided for pt to assist in moving legs off L EOB with min success noted. Cues provided for pt to grab L bed rail with R UE, but no initiation noted. Pt required total assist x2 to transition supine <> sit L EOB and roll to R 1x due to limited if any initiation by pt despite max cues and extra time to allow pt to engage.    Transfers                   General transfer comment: deferred due to low level of arousal this date      Balance Overall balance assessment: Needs assistance Sitting-balance support: No upper extremity supported, Feet supported, Bilateral upper extremity supported Sitting balance-Leahy Scale: Poor Sitting balance - Comments: Pt leans posteriorly initially requiring modA with pt initermittently activating to flex trunk and progressing to CGA for up to ~15 seconds, but as pt fatigued she began to lean posteriorly and laterally to the L more and needed maxA Postural control: Posterior lean, Left lateral lean  Standing balance comment: deferred due to low level of arousal this date                           ADL either performed or assessed with clinical judgement   ADL Overall ADL's :  Needs assistance/impaired     Grooming: Total assistance;Sitting                               Functional mobility during ADLs: Total assistance;+2 for physical assistance;+2 for safety/equipment       Vision Baseline Vision/History: 1 Wears glasses Ability to See in Adequate Light: 0 Adequate Patient Visual Report: No change from baseline       Perception         Praxis         Pertinent Vitals/Pain Pain Assessment Pain Assessment: Faces Faces Pain Scale: Hurts little more Pain Location: generalized Pain Descriptors / Indicators: Discomfort, Grimacing, Guarding, Moaning Pain Intervention(s): Limited activity within patient's tolerance, Monitored during session     Extremity/Trunk Assessment Upper Extremity Assessment Upper Extremity Assessment: Generalized weakness (LUE edema)   Lower Extremity Assessment Lower Extremity Assessment: Defer to PT evaluation   Cervical / Trunk Assessment Cervical / Trunk Assessment: Other exceptions (forward head) Cervical / Trunk Exceptions: forward head   Communication Communication Communication: Impaired Factors Affecting Communication: Other (comment) (did not speak much this session due to lethargy)   Cognition Arousal: Lethargic Behavior During Therapy: Flat affect Cognition: Difficult to assess Difficult to assess due to: Level of arousal                             Following commands: Impaired Following commands impaired: Follows one step commands inconsistently, Follows one step commands with increased time     Cueing  General Comments   Cueing Techniques: Verbal cues;Tactile cues;Gestural cues  VSS on 2L   Exercises Exercises: Other exercises Other Exercises Other Exercises: While sitting EOB, attempted IS > 3 reps but poor initiation by pt   Shoulder Instructions      Home Living Family/patient expects to be discharged to:: Private residence Living Arrangements:  Alone Available Help at Discharge: Personal care attendant;Family;Available PRN/intermittently (comes out most days morning and evenings to assist with dressing; son lives a few doors down) Type of Home: House Home Access: Level entry     Home Layout: Two level;Able to live on main level with bedroom/bathroom     Bathroom Shower/Tub: Arts development officer Toilet: Handicapped height     Home Equipment: Wheelchair - Event organiser (2 wheels);Rollator (4 wheels);Shower seat;Grab bars - tub/shower;BSC/3in1 (suction cup grab bars)          Prior Functioning/Environment Prior Level of Function : Needs assist             Mobility Comments: Pt was working with PT at [facility] on walking with a RW before last admission. When pt was living at home before February, she was independent without AD ADLs Comments: Before February, pt was independent and driving. Has been working with OT on ADLs at SNF recently    OT Problem List: Decreased strength;Decreased range of motion;Decreased activity tolerance;Impaired balance (sitting and/or standing);Obesity;Cardiopulmonary status limiting activity;Decreased knowledge of precautions;Decreased knowledge of use of DME or AE;Increased edema   OT Treatment/Interventions: Self-care/ADL training;Therapeutic exercise;Neuromuscular education;DME and/or AE instruction;Patient/family education;Balance training;Therapeutic activities  OT Goals(Current goals can be found in the care plan section)   Acute Rehab OT Goals Patient Stated Goal: unable due to level of arousal OT Goal Formulation: Patient unable to participate in goal setting Time For Goal Achievement: 10/08/23 Potential to Achieve Goals: Fair   OT Frequency:  Min 2X/week    Co-evaluation PT/OT/SLP Co-Evaluation/Treatment: Yes Reason for Co-Treatment: Complexity of the patient's impairments (multi-system involvement);For patient/therapist safety;To address  functional/ADL transfers PT goals addressed during session: Mobility/safety with mobility;Balance;Proper use of DME OT goals addressed during session: ADL's and self-care      AM-PAC OT 6 Clicks Daily Activity     Outcome Measure Help from another person eating meals?: A Lot Help from another person taking care of personal grooming?: A Lot Help from another person toileting, which includes using toliet, bedpan, or urinal?: Total Help from another person bathing (including washing, rinsing, drying)?: Total Help from another person to put on and taking off regular upper body clothing?: Total Help from another person to put on and taking off regular lower body clothing?: Total 6 Click Score: 8   End of Session Equipment Utilized During Treatment: Oxygen Nurse Communication: Mobility status;Need for lift equipment  Activity Tolerance: Patient tolerated treatment well Patient left: in bed;with call bell/phone within reach;with bed alarm set  OT Visit Diagnosis: Muscle weakness (generalized) (M62.81);Other abnormalities of gait and mobility (R26.89);Unsteadiness on feet (R26.81);Other symptoms and signs involving cognitive function                Time: 1425-1457 OT Time Calculation (min): 32 min Charges:  OT General Charges $OT Visit: 1 Visit OT Evaluation $OT Eval Moderate Complexity: 1 Mod  Elma JONETTA Lebron FREDERICK, OTR/L [facility] Acute Rehabilitation Office: [phone]   Elma JONETTA Lebron 09/24/2023, 4:26 PM

## 2023-09-24 NOTE — Progress Notes (Addendum)
 Rounding Note   Patient Name: Donna Sexton Date of Encounter: 09/24/2023  [facility] Cardiologist: Peter [place], MD   Subjective BP 117/79, creatinine 0.8, WBC 17.7, hemoglobin 13.5.  She is somnolent but arousable  Scheduled Meds:  amiodarone   200 mg Oral Daily   apixaban   5 mg Oral BID   atorvastatin   40 mg Oral Daily   budesonide  (PULMICORT ) nebulizer solution  0.25 mg Nebulization BID   diltiazem   120 mg Oral Daily   divalproex   1,500 mg Oral QHS   famotidine   20 mg Oral Daily   fluticasone  furoate-vilanterol  1 puff Inhalation Daily   guaiFENesin   600 mg Oral BID   metoprolol  tartrate  25 mg Oral BID   mirabegron  ER  25 mg Oral QHS   polyethylene glycol  17 g Oral Daily   senna-docusate  2 tablet Oral QHS   Continuous Infusions:  sodium chloride  50 mL/hr at 09/24/23 0305   ceFEPime  (MAXIPIME ) IV 2 g (09/24/23 0434)   sodium chloride      vancomycin      PRN Meds: acetaminophen  **OR** acetaminophen , bisacodyl , HYDROcodone -acetaminophen , ipratropium-albuterol , metoCLOPramide  (REGLAN ) injection, ondansetron  **OR** ondansetron  (ZOFRAN ) IV   Vital Signs  Vitals:   09/24/23 0804 09/24/23 0837 09/24/23 1053 09/24/23 1103  BP:   117/79   Pulse:  77 (!) 101 98  Resp:  17 (!) 26 20  Temp:   99.9 F (37.7 C)   TempSrc:   Axillary   SpO2: 96% 94% 97% 98%    Intake/Output Summary (Last 24 hours) at 09/24/2023 1111 Last data filed at 09/24/2023 0434 Gross per 24 hour  Intake 473.84 ml  Output --  Net 473.84 ml      09/12/2023    5:00 AM 09/11/2023    5:00 AM 09/10/2023    5:00 AM  Last 3 Weights  Weight (lbs) 231 lb 4.2 oz 233 lb 7.5 oz 236 lb 1.8 oz  Weight (kg) 104.9 kg 105.901 kg 107.1 kg      Telemetry Afib rate 100s - Personally Reviewed  ECG  No new ECG - Personally Reviewed  Physical Exam  GEN: Chronically ill-appearing Neck: No JVD Cardiac: Irregular, tachycardic, no murmur Respiratory: Diminished breath GI: Soft, nontender MS: No  edema Neuro: Somnolent but arousable Psych: Unable to assess  Labs High Sensitivity Troponin:   Recent Labs  Lab 09/05/23 0812 09/23/23 1516 09/23/23 1727  TROPONINIHS 7 3 3      Chemistry Recent Labs  Lab 09/23/23 1516 09/23/23 2050 09/24/23 0103 09/24/23 0252  NA 137  --  136 137  K 3.7  --  4.1 4.1  CL 99  --   --  99  CO2 28  --   --  22  GLUCOSE 165*  --   --  128*  BUN 13  --   --  16  CREATININE 0.77  --   --  0.81  CALCIUM  9.2  --   --  9.3  MG  --  1.7  --  1.8  PROT 5.9*  --   --  6.0*  ALBUMIN 3.2*  --   --  3.1*  AST 15  --   --  17  ALT 11  --   --  10  ALKPHOS 52  --   --  51  BILITOT 1.2  --   --  1.7*  GFRNONAA >60  --   --  >60  ANIONGAP 10  --   --  16*  Lipids No results for input(s): CHOL, TRIG, HDL, LABVLDL, LDLCALC, CHOLHDL in the last 168 hours.  Hematology Recent Labs  Lab 09/23/23 1516 09/24/23 0103 09/24/23 0252  WBC 15.4*  --  17.7*  RBC 3.78*  --  4.00  HGB 12.8 12.2 13.5  HCT 38.8 36.0 41.3  MCV 102.6*  --  103.3*  MCH 33.9  --  33.8  MCHC 33.0  --  32.7  RDW 13.5  --  13.7  PLT 361  --  338   Thyroid   Recent Labs  Lab 09/24/23 0252  TSH 2.247    BNP Recent Labs  Lab 09/23/23 1516  BNP 227.9*    DDimer No results for input(s): DDIMER in the last 168 hours.   Radiology  CT HEAD WO CONTRAST ( ) Result Date: 09/23/2023 CLINICAL DATA:  Altered mental status EXAM: CT HEAD WITHOUT CONTRAST TECHNIQUE: Contiguous axial images were obtained from the base of the skull through the vertex without intravenous contrast. RADIATION DOSE REDUCTION: This exam was performed according to the departmental dose-optimization program which includes automated exposure control, adjustment of the mA and/or kV according to patient size and/or use of iterative reconstruction technique. COMPARISON:  09/05/2023 FINDINGS: Brain: No evidence of acute infarction, hemorrhage, hydrocephalus, extra-axial collection or mass lesion/mass  effect. Chronic atrophic and ischemic changes are noted. Vascular: No hyperdense vessel or unexpected calcification. Skull: Normal. Negative for fracture or focal lesion. Sinuses/Orbits: No acute finding. Other: None. IMPRESSION: Chronic atrophic and ischemic changes without acute abnormality. Electronically Signed   By: Oneil Devonshire M.D.   On: 09/23/2023 23:14   CT ABDOMEN PELVIS W CONTRAST Result Date: 09/23/2023 EXAM: CT ABDOMEN AND PELVIS WITH CONTRAST 09/23/2023 04:51:42 PM TECHNIQUE: CT of the abdomen and pelvis was performed with the administration of intravenous contrast (75mL iohexol  (OMNIPAQUE ) 350 MG/ML injection). Multiplanar reformatted images are provided for review. Automated exposure control, iterative reconstruction, and/or weight-based adjustment of the mA/kV was utilized to reduce the radiation dose to as low as reasonably achievable. COMPARISON: 09/07/2023 CLINICAL HISTORY: Abdominal pain, acute, nonlocalized. Chief complaints; Nausea; Emesis; Weakness; Chest Pain. FINDINGS: LOWER CHEST: Small pleural effusions left greater than right as before. Consolidation/atelectasis in the lung bases left greater than right as before. LIVER: Probable cyst in hepatic segment 4a, stable. GALLBLADDER AND BILE DUCTS: Cholecystectomy clips. SPLEEN: No acute abnormality. PANCREAS: No acute abnormality. ADRENAL GLANDS: No acute abnormality. KIDNEYS, URETERS AND BLADDER: No stones in the kidneys or ureters. No hydronephrosis. No perinephric or periureteral stranding. Urinary bladder is nondistended. GI AND BOWEL: Scattered distal descending and sigmoid diverticula without adjacent inflammatory change. Moderate fecal material mildly distends the rectum, without adjacent inflammatory change. There is no bowel obstruction. PERITONEUM AND RETROPERITONEUM: No ascites. No free air. VASCULATURE: Aorta is normal in caliber. LYMPH NODES: No lymphadenopathy. REPRODUCTIVE ORGANS: Post hysterectomy. BONES AND SOFT TISSUES:  Postsurgical decompression L4-5. No acute osseous abnormality. No focal soft tissue abnormality. HEART AND MEDIASTINUM: Small to moderate pericardial effusion has increased. IMPRESSION: 1. No acute findings in the abdomen or pelvis. Electronically signed by: Katheleen Faes MD 09/23/2023 04:58 PM EDT RP Workstation: [id number]   DG Chest Portable 1 View Result Date: 09/23/2023 CLINICAL DATA:  Chest pain.  Nausea and emesis with weakness. EXAM: PORTABLE CHEST 1 VIEW COMPARISON:  CT scan 10/08/2023 and chest radiograph 09/05/2023 FINDINGS: The patient is rotated to the right on today's radiograph, reducing diagnostic sensitivity and specificity. Low lung volumes are present, causing crowding of the pulmonary vasculature. Moderate cardiomegaly noted. Obscured left hemidiaphragm  compatible with known left pleural effusion and left basilar atelectasis. Lesser degree of indistinctness of the right hemidiaphragm favoring peripheral atelectasis in the right lower lobe. This is roughly similar to the appearance on the CT scan from 09/07/2023. Atherosclerotic calcification of the aortic arch. Degenerative glenohumeral arthropathy. Bony demineralization. IMPRESSION: 1. Moderate cardiomegaly. 2. Left pleural effusion and left basilar atelectasis. 3. Mild right basilar atelectasis. 4. Low lung volumes. 5. Degenerative glenohumeral arthropathy. 6. Bony demineralization. Electronically Signed   By: Ryan Salvage M.D.   On: 09/23/2023 16:14    Cardiac Studies   Patient Profile   73 y.o. female with medical history significant of bipolar disorder, hypertension, OSA, paroxysmal atrial fibrillation, chronic diastolic heart failure who presented with acute metabolic encephalopathy we are consulted for atrial fibrillation.  She presented with nausea/vomiting and confusion.  Noted to be in A-fib with heart rates in the 100s.   Assessment & Plan  Paroxysmal atrial fibrillation: Home regimen includes metoprolol  25 mg twice  daily, diltiazem  120 mg daily, amiodarone  100 mg daily, Eliquis  5 mg twice daily.  Presented with A-fib with RVR in setting of suspected infection - Rates appear controlled, continue home diltiazem  and metoprolol  - Continue p.o. amiodarone  - Continue Eliquis  5 mg twice daily - Check echocardiogram  Pericardial effusion: Small to moderate effusion noted on CT.  Will check echocardiogram  Altered mental status: Likely multifactorial in setting of her medications suspected infection.  She is on treatment for pneumonia  Pneumonia: Treatment per primary team  For questions or updates, please contact [facility] Please consult [email or web address] for contact info under       Signed, Lonni LITTIE Nanas, MD  09/24/2023, 11:11 AM

## 2023-09-24 NOTE — Plan of Care (Signed)
  Problem: Activity: Goal: Ability to tolerate increased activity will improve 09/24/2023 0227 by Marvis Kenneth SAILOR, RN Outcome: Progressing 09/24/2023 0222 by Marvis Kenneth SAILOR, RN Outcome: Progressing   Problem: Clinical Measurements: Goal: Ability to maintain a body temperature in the normal range will improve 09/24/2023 0227 by Marvis Kenneth SAILOR, RN Outcome: Progressing 09/24/2023 0222 by Marvis Kenneth SAILOR, RN Outcome: Progressing   Problem: Respiratory: Goal: Ability to maintain adequate ventilation will improve 09/24/2023 0227 by Marvis Kenneth SAILOR, RN Outcome: Progressing 09/24/2023 0222 by Marvis Kenneth SAILOR, RN Outcome: Progressing Goal: Ability to maintain a clear airway will improve 09/24/2023 0227 by Marvis Kenneth SAILOR, RN Outcome: Progressing 09/24/2023 0222 by Marvis Kenneth SAILOR, RN Outcome: Progressing

## 2023-09-24 NOTE — Consult Note (Signed)
 NAME:  Donna Sexton, MRN:  [id number], DOB:  1950-05-06, LOS: 1 ADMISSION DATE:  09/23/2023, CONSULTATION DATE:  09/24/2023 REFERRING MD: Doutova, Anastassia, MD, CHIEF COMPLAINT:  AMS   History of Present Illness:  A 73 yr old female patient with OSA, Afib, HTN, dyslipidemia, chronic pain syndrome, bipolar, anxiety, GERD, asthma, recurrent UTI, chronic hypoxemic failure due to COVID-19 infection Feb 2025, and D-CHF. I was called by the hospitalist, Dr. Silvester, to assess if she needs higher level of care given her lethargy and slightly elevated VBG pCO2 65, pH 7.29, pO2 36. She has N/V and not a good candidate for NIV. She has of She was discharged Sep 12, 2023 with UTI, sepsis, and acute metabolic encephalopathy. She is on multiple meds that can cause sedation like Depakote , Lyrica , Narco, and Trazodone . Hx was taken from chart review. She presented with shortness of breath, increased confusion, nausea, and vomiting.   Pertinent  Medical History  OSA, Afib, HTN, dyslipidemia, chronic pain syndrome, bipolar, anxiety, GERD, asthma, recurrent UTI, chronic hypoxemic failure due to COVID-19 infection Feb 2025, D-CHF, UTI, sepsis with metabolic encephalopathy. Significant Hospital Events: Including procedures, antibiotic start and stop dates in addition to other pertinent events     Interim History / Subjective:    Objective    Blood pressure (!) 123/96, pulse (!) 103, temperature 98.1 F (36.7 C), temperature source Oral, resp. rate (!) 23, SpO2 99%.        Intake/Output Summary (Last 24 hours) at 09/24/2023 0137 Last data filed at 09/24/2023 0011 Gross per 24 hour  Intake 400 ml  Output --  Net 400 ml   There were no vitals filed for this visit.  Examination: General: sleeping, but oriented x3, and comfortable. On 2 L [place]. SpO2 96%. Afib 100-120. BP is stable HENT: PERL, normal pharynx and oral mucosa. No LNE or thyromegaly. No JVD Lungs: symmetrical air entry bilaterally. No  crackles. Occasional exp wheezing Cardiovascular: NL S1/S2. No m/g/r Abdomen: no distension or tenderness Extremities: trace edema. Symmetrical  Neuro: nonfocal   W/u: ABG: 7.36/46/71/93%, on 2 L [place] PCXR:  1. Moderate cardiomegaly. 2. Left pleural effusion and left basilar atelectasis. 3. Mild right basilar atelectasis. 4. Low lung volumes. 5. Degenerative glenohumeral arthropathy. 6. Bony demineralization. CT abd: lower lunsg: small pleural effusions left greater than right as before. Consolidation/atelectasis in the lung bases left greater than right as before. Resolved problem list   Assessment and Plan  Metabolic encephalopathy: due to meds, sepsis, possible PNA, and baseline mental status (bipolar) -Hold psych and pain meds for now -HOB elevation -NPO x meds -UDS   LLL PNA Asthma -On Vanco and Cefepime  -Add Duoneb PRN -Scheduled Pulmicort  -F/u Cx and urine Ags -I/S   OSA -?compliance with the CPAP. Hold for now due to mental status and vomiting   Afib/RVR -Amiodarone /Eliquis  -Resume BB -Echo Labs   CBC: Recent Labs  Lab 09/23/23 1516 09/24/23 0103  WBC 15.4*  --   NEUTROABS 11.9*  --   HGB 12.8 12.2  HCT 38.8 36.0  MCV 102.6*  --   PLT 361  --     Basic Metabolic Panel: Recent Labs  Lab 09/23/23 1516 09/23/23 2050 09/24/23 0103  NA 137  --  136  K 3.7  --  4.1  CL 99  --   --   CO2 28  --   --   GLUCOSE 165*  --   --   BUN 13  --   --  CREATININE 0.77  --   --   CALCIUM  9.2  --   --   MG  --  1.7  --   PHOS  --  4.5  --    GFR: Estimated Creatinine Clearance: 72.9 mL/min (by C-G formula based on SCr of 0.77 mg/dL). Recent Labs  Lab 09/23/23 1516 09/23/23 1523 09/23/23 2050  PROCALCITON  --   --  0.16  WBC 15.4*  --   --   LATICACIDVEN  --  1.3  --     Liver Function Tests: Recent Labs  Lab 09/23/23 1516  AST 15  ALT 11  ALKPHOS 52  BILITOT 1.2  PROT 5.9*  ALBUMIN 3.2*   Recent Labs  Lab 09/23/23 2234  LIPASE 21    No results for input(s): AMMONIA in the last 168 hours.  ABG    Component Value Date/Time   PHART 7.361 09/24/2023 0103   PCO2ART 45.9 09/24/2023 0103   PO2ART 71 (L) 09/24/2023 0103   HCO3 26.0 09/24/2023 0103   TCO2 27 09/24/2023 0103   ACIDBASEDEF 2.0 03/02/2023 0127   O2SAT 93 09/24/2023 0103     Coagulation Profile: No results for input(s): INR, PROTIME in the last 168 hours.  Cardiac Enzymes: Recent Labs  Lab 09/23/23 2050  CKTOTAL 12*    HbA1C: Hemoglobin A1C  Date/Time Value Ref Range Status  02/16/2023 10:14 AM 6.2 (A) 4.0 - 5.6 % Final  08/05/2022 01:53 PM 5.3 4.0 - 5.6 % Final   Hgb A1c MFr Bld  Date/Time Value Ref Range Status  04/25/2022 08:07 AM 5.7 (H) <5.7 % of total Hgb Final    Comment:    For someone without known diabetes, a hemoglobin  A1c value between 5.7% and 6.4% is consistent with prediabetes and should be confirmed with a  follow-up test. . For someone with known diabetes, a value <7% indicates that their diabetes is well controlled. A1c targets should be individualized based on duration of diabetes, age, comorbid conditions, and other considerations. . This assay result is consistent with an increased risk of diabetes. . Currently, no consensus exists regarding use of hemoglobin A1c for diagnosis of diabetes for children. SABRA   09/16/2020 09:11 AM 5.3 <5.7 % of total Hgb Final    Comment:    For the purpose of screening for the presence of diabetes: . <5.7%       Consistent with the absence of diabetes 5.7-6.4%    Consistent with increased risk for diabetes             (prediabetes) > or =6.5%  Consistent with diabetes . This assay result is consistent with a decreased risk of diabetes. . Currently, no consensus exists regarding use of hemoglobin A1c for diagnosis of diabetes in children. . According to American Diabetes Association (ADA) guidelines, hemoglobin A1c <7.0% represents optimal control in  non-pregnant diabetic patients. Different metrics may apply to specific patient populations.  Standards of Medical Care in Diabetes(ADA). .     CBG: No results for input(s): GLUCAP in the last 168 hours.  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   Surgical History:   Past Surgical History:  Procedure Laterality Date   ABDOMINAL HYSTERECTOMY     BREAST IMPLANT REMOVAL Bilateral 2022   BREAST SURGERY  01/10/1978   implants bilateral   CARPAL TUNNEL RELEASE     CHOLECYSTECTOMY     COSMETIC SURGERY     EYE SURGERY  cataract both eyes   HERNIA REPAIR     JOINT REPLACEMENT     PAROTID ENDOSCOPY     SPINE SURGERY  lower back   TUBAL LIGATION       Social History:   reports that she has never smoked. She has never used smokeless tobacco. She reports that she does not drink alcohol and does not use drugs.   Family History:  Her family history includes Alcohol abuse in her maternal uncle and maternal uncle; Anxiety disorder in her mother; Arthritis in her father; Diabetes in her father; Heart attack in her father and mother; Heart failure in her brother and mother; Hypertension in her father and mother. There is no history of Breast cancer.   Allergies Allergies  Allergen Reactions   Morphine Nausea And Vomiting     Home Medications  Prior to  Admission medications   Medication Sig Start Date End Date Taking? Authorizing Provider  acetaminophen  (TYLENOL ) 500 MG tablet Take 1 tablet (500 mg total) by mouth every 6 (six) hours as needed for mild pain (pain score 1-3) (or Fever >/= 101). Patient taking differently: Take 1,000 mg by mouth 3 (three) times daily. 03/10/23   Rai, Nydia POUR, MD  amiodarone  (PACERONE ) 100 MG tablet Take 100 mg by mouth daily.    [provider]  apixaban  (ELIQUIS ) 5 MG TABS tablet Take 1 tablet (5 mg total) by mouth 2 (two) times daily. 06/20/23   Alvan Dorothyann BIRCH, MD  atorvastatin  (LIPITOR) 40 MG tablet Take 1 tablet (40 mg total) by mouth daily. 05/22/23   Alvan Dorothyann BIRCH, MD  bisacodyl  (DULCOLAX) 10 MG suppository Place 10 mg rectally daily as needed for moderate constipation.    [provider]  diltiazem  (CARDIZEM  CD) 120 MG 24 hr capsule Take 120 mg by mouth daily.    [provider]  divalproex  (DEPAKOTE  ER) 500 MG 24 hr tablet Take 3 tablets (1,500 mg total) by mouth at bedtime. 09/01/22   Cottle, Lorene KANDICE Raddle., MD  famotidine  (PEPCID ) 20 MG tablet Take 1 tablet (20 mg total) by mouth daily. 09/12/23   Raenelle Coria, MD  furosemide  (LASIX ) 40 MG tablet Take 1 tablet (40 mg total) by mouth daily. Patient taking differently: Take 80 mg by mouth daily. 05/17/23   Rana Lum CROME, NP  HYDROcodone -acetaminophen  (NORCO/VICODIN) 5-325 MG tablet Take 1 tablet by mouth every 6 (six) hours as needed for moderate pain (pain score 4-6). 09/12/23   Raenelle Coria, MD  levalbuterol  (XOPENEX ) 0.63 MG/3ML nebulizer solution Take 3 mLs (0.63 mg total) by nebulization every 6 (six) hours as needed for wheezing or shortness of breath. 03/10/23   Rai, Nydia POUR, MD  metoprolol  tartrate (LOPRESSOR ) 25 MG tablet Take 1 tablet (25 mg total) by mouth 2 (two) times daily. 07/07/23 10/05/23  Terra Fairy PARAS, PA-C  mirabegron  ER (MYRBETRIQ ) 25 MG TB24 tablet Take 25 mg by mouth at bedtime.    [provider]  mometasone -formoterol  (DULERA ) 100-5 MCG/ACT AERO Inhale 2 puffs into the lungs 2 (two) times daily. 03/10/23   Rai, Ripudeep K,  MD  nitrofurantoin , macrocrystal-monohydrate, (MACROBID ) 100 MG capsule Take 1 capsule (100 mg total) by mouth at bedtime. 09/12/23   Raenelle Coria, MD  ondansetron  (ZOFRAN ) 4 MG tablet Take 1 tablet (4 mg total) by mouth every 6 (six) hours as needed for nausea. 07/27/23   Sebastian Toribio GAILS, MD  polyethylene glycol (MIRALAX  / GLYCOLAX ) 17 g packet Take 17 g by mouth daily as needed for moderate constipation. Patient taking differently: Take 17 g by mouth daily. 04/19/23   Arlice Reichert, MD  potassium chloride  SA (KLOR-CON  M) 20 MEQ tablet Take 1 tablet (20 mEq total) by mouth daily. 06/28/23   Alvan Dorothyann BIRCH, MD  pregabalin  (LYRICA ) 50 MG capsule Take 1 capsule (50 mg total) by mouth 2 (two) times daily for 5 days. 09/12/23 09/17/23  Raenelle Coria, MD  senna-docusate (SENOKOT-S) 8.6-50 MG tablet Take 2 tablets by mouth at bedtime. 03/10/23   Rai, Nydia POUR, MD  traZODone  (DESYREL ) 50 MG tablet Take 50 mg by mouth at bedtime as needed for sleep.    [provider]     Critical care time: 43 min   Thank you Will sign off   Mancel Ply, MD [facility] Pager: see AMION

## 2023-09-25 ENCOUNTER — Inpatient Hospital Stay (HOSPITAL_COMMUNITY)

## 2023-09-25 ENCOUNTER — Inpatient Hospital Stay: Admitting: Family Medicine

## 2023-09-25 DIAGNOSIS — I480 Paroxysmal atrial fibrillation: Secondary | ICD-10-CM | POA: Diagnosis not present

## 2023-09-25 DIAGNOSIS — J9601 Acute respiratory failure with hypoxia: Secondary | ICD-10-CM | POA: Diagnosis not present

## 2023-09-25 DIAGNOSIS — I3139 Other pericardial effusion (noninflammatory): Secondary | ICD-10-CM | POA: Diagnosis not present

## 2023-09-25 LAB — CBC WITH DIFFERENTIAL/PLATELET
Abs Immature Granulocytes: 0.11 K/uL — ABNORMAL HIGH (ref 0.00–0.07)
Basophils Absolute: 0.1 K/uL (ref 0.0–0.1)
Basophils Relative: 1 %
Eosinophils Absolute: 0 K/uL (ref 0.0–0.5)
Eosinophils Relative: 0 %
HCT: 30.6 % — ABNORMAL LOW (ref 36.0–46.0)
Hemoglobin: 10.2 g/dL — ABNORMAL LOW (ref 12.0–15.0)
Immature Granulocytes: 1 %
Lymphocytes Relative: 6 %
Lymphs Abs: 0.7 K/uL (ref 0.7–4.0)
MCH: 33.9 pg (ref 26.0–34.0)
MCHC: 33.3 g/dL (ref 30.0–36.0)
MCV: 101.7 fL — ABNORMAL HIGH (ref 80.0–100.0)
Monocytes Absolute: 2.2 K/uL — ABNORMAL HIGH (ref 0.1–1.0)
Monocytes Relative: 17 %
Neutro Abs: 9.7 K/uL — ABNORMAL HIGH (ref 1.7–7.7)
Neutrophils Relative %: 75 %
Platelets: 185 K/uL (ref 150–400)
RBC: 3.01 MIL/uL — ABNORMAL LOW (ref 3.87–5.11)
RDW: 13.6 % (ref 11.5–15.5)
WBC: 12.9 K/uL — ABNORMAL HIGH (ref 4.0–10.5)
nRBC: 0 % (ref 0.0–0.2)

## 2023-09-25 LAB — ECHOCARDIOGRAM COMPLETE
AR max vel: 2.57 cm2
AV Area VTI: 2.95 cm2
AV Area mean vel: 2.99 cm2
AV Mean grad: 4 mmHg
AV Peak grad: 8.2 mmHg
Ao pk vel: 1.43 m/s
Area-P 1/2: 4.41 cm2
Calc EF: 58.5 %
MV VTI: 2.73 cm2
S' Lateral: 3 cm
Single Plane A2C EF: 56.1 %
Single Plane A4C EF: 58.4 %

## 2023-09-25 LAB — LEGIONELLA PNEUMOPHILA SEROGP 1 UR AG: L. pneumophila Serogp 1 Ur Ag: NEGATIVE

## 2023-09-25 MED ORDER — ENSURE PLUS HIGH PROTEIN PO LIQD
237.0000 mL | Freq: Two times a day (BID) | ORAL | Status: DC
  Administered 2023-09-26 – 2023-09-28 (×4): 237 mL via ORAL

## 2023-09-25 MED ORDER — THIAMINE MONONITRATE 100 MG PO TABS
100.0000 mg | ORAL_TABLET | Freq: Every day | ORAL | Status: DC
  Administered 2023-09-25 – 2023-10-03 (×9): 100 mg via ORAL
  Filled 2023-09-25 (×10): qty 1

## 2023-09-25 MED ORDER — BOOST / RESOURCE BREEZE PO LIQD CUSTOM
1.0000 | Freq: Three times a day (TID) | ORAL | Status: DC
  Administered 2023-09-25: 1 via ORAL

## 2023-09-25 MED ORDER — ADULT MULTIVITAMIN W/MINERALS CH
1.0000 | ORAL_TABLET | Freq: Every day | ORAL | Status: DC
  Administered 2023-09-25 – 2023-10-03 (×9): 1 via ORAL
  Filled 2023-09-25 (×10): qty 1

## 2023-09-25 NOTE — Progress Notes (Addendum)
 PROGRESS NOTE    Donna Sexton  FMW:[id number] DOB: [birth year] DOA: 09/23/2023 PCP: Alvan Dorothyann BIRCH, MD   Brief Narrative:   73 y.o. female with medical history significant of bipolar disorder anemia and anxiety asthma GERD, IBS, HL,D HTN, osteopenia overactive bladder, sleep apnea, prediabetes, recurrent UTIs ,paroxysmal atrial fibrillation ,chronic diastolic CHF, Presented with nausea, vomiting and altered confusion. She is being treated for a possible PNA.  Assessment & Plan:  Principal Problem:   Acute respiratory failure with hypoxia (HCC) Active Problems:   Bipolar disorder (HCC)   Hyperlipidemia   Essential hypertension   Chronic heart failure with preserved ejection fraction (HCC)   Atrial fibrillation with rapid ventricular response (HCC)   CAP (community acquired pneumonia)   Constipation   Class 3 obesity   Acute metabolic encephalopathy   Pericardial effusion   Acute metabolic encephalopathy, POA:    - most likely multifactorial secondary to dehydration, polypharmacy and possible PNA. - CT head didn't show any acute abnormalities  - hold off on diuresis  - Hold sedating medications   - No focal neuro deficits.      Acute respiratory failure with hypoxia,POA:  Chest x-ray showing left pleural effusion and left basilar atelectasis right mild right basilar atelectasis and low lung volumes Patient is currently on anticoagulation, continue Eliquis  CT showed Consolidation/atelectasis in the lung bases left greater than right as before. Noted white blood cell count of 15 Treat for possible underlying pneumonia       Atrial fibrillation with rapid ventricular response, POA:  Continue diltiazem  p.o. with holding parameters  continue amiodarone  200 mg daily  continue Eliquis  5 mg p.o. twice daily as per cardiology recommendations Cardiology on board F/u ECHO   Bipolar disorder  Continue home medications   Chronic heart failure with preserved ejection  fraction Currently actually appears to be somewhat euvolemic   Class 3 obesity -Contributing to comorbidity and complicating medical management        Constipation Bowel regimen ordered   Essential hypertension Prn antihypertensives   Hyperlipidemia Continue Lipitor 40 mg daily  Poor oral intake,POA: Ordered SLP evaluation because she is at high risk of aspiration.   CAP (community acquired pneumonia),possible bacterial Continue with IV Cefepime . Dced Vanc as MRSA Nares is negative.   Pericardial effusion Appreciate cardiology consult ECHO showed large pericardial effusion but no tamponade.  Disposition: Lives at [facility].   DVT prophylaxis: SCDs Start: 09/24/23 0152 apixaban  (ELIQUIS ) tablet 5 mg     Code Status: Full Code Family Communication:  Ex-husband at the bedside Status is: Inpatient Remains inpatient appropriate because: AMS, Sepsis    Subjective:  She is still very drowsy. Her appetite is not good. Ex-husband is present at the bedside. She says that she doesn't feel good but didn't mention any specific symptoms.  Examination:  General exam: Appears drowsy, able to responds to most questions Respiratory system: Clear to auscultation. Respiratory effort normal. Cardiovascular system: S1 & S2 heard, RRR. No JVD, murmurs, rubs, gallops or clicks. No pedal edema. Gastrointestinal system: Abdomen is nondistended, soft and nontender. No organomegaly or masses felt. Normal bowel sounds heard. Central nervous system: Alert and oriented. No focal neurological deficits. Extremities: Symmetric 5 x 5 power. Skin: No rashes, lesions or ulcers Psychiatry: Judgement and insight appear normal. Mood & affect appropriate.       Diet Orders (From admission, onward)     Start     Ordered   09/24/23 0152  Diet clear liquid Room service appropriate?  Yes; Fluid consistency: Thin  Diet effective now       Question Answer Comment  Room service appropriate? Yes    Fluid consistency: Thin      09/24/23 0151            Objective: Vitals:   09/24/23 2120 09/24/23 2302 09/25/23 0300 09/25/23 0752  BP: 107/70   126/78  Pulse: (!) 106   100  Resp:  (!) 22  20  Temp:  99 F (37.2 C) 99.2 F (37.3 C) 99 F (37.2 C)  TempSrc:  Oral Oral Oral  SpO2:    96%    Intake/Output Summary (Last 24 hours) at 09/25/2023 1033 Last data filed at 09/25/2023 0400 Gross per 24 hour  Intake 1318.78 ml  Output 400 ml  Net 918.78 ml   There were no vitals filed for this visit.  Scheduled Meds:  amiodarone   200 mg Oral Daily   apixaban   5 mg Oral BID   atorvastatin   40 mg Oral Daily   budesonide  (PULMICORT ) nebulizer solution  0.25 mg Nebulization BID   diltiazem   120 mg Oral Daily   divalproex   1,500 mg Oral QHS   famotidine   20 mg Oral Daily   fluticasone  furoate-vilanterol  1 puff Inhalation Daily   guaiFENesin   600 mg Oral BID   metoprolol  tartrate  25 mg Oral BID   mirabegron  ER  25 mg Oral QHS   polyethylene glycol  17 g Oral Daily   senna-docusate  2 tablet Oral QHS   Continuous Infusions:  sodium chloride  50 mL/hr at 09/24/23 0305   ceFEPime  (MAXIPIME ) IV 2 g (09/25/23 0445)   sodium chloride       Nutritional status     There is no height or weight on file to calculate BMI.  Data Reviewed:   CBC: Recent Labs  Lab 09/23/23 1516 09/24/23 0103 09/24/23 0252  WBC 15.4*  --  17.7*  NEUTROABS 11.9*  --   --   HGB 12.8 12.2 13.5  HCT 38.8 36.0 41.3  MCV 102.6*  --  103.3*  PLT 361  --  338   Basic Metabolic Panel: Recent Labs  Lab 09/23/23 1516 09/23/23 2050 09/24/23 0103 09/24/23 0252  NA 137  --  136 137  K 3.7  --  4.1 4.1  CL 99  --   --  99  CO2 28  --   --  22  GLUCOSE 165*  --   --  128*  BUN 13  --   --  16  CREATININE 0.77  --   --  0.81  CALCIUM  9.2  --   --  9.3  MG  --  1.7  --  1.8  PHOS  --  4.5  --  4.9*   GFR: Estimated Creatinine Clearance: 72 mL/min (by C-G formula based on SCr of 0.81  mg/dL). Liver Function Tests: Recent Labs  Lab 09/23/23 1516 09/24/23 0252  AST 15 17  ALT 11 10  ALKPHOS 52 51  BILITOT 1.2 1.7*  PROT 5.9* 6.0*  ALBUMIN 3.2* 3.1*   Recent Labs  Lab 09/23/23 2234  LIPASE 21   No results for input(s): AMMONIA in the last 168 hours. Coagulation Profile: No results for input(s): INR, PROTIME in the last 168 hours. Cardiac Enzymes: Recent Labs  Lab 09/23/23 2050  CKTOTAL 12*   BNP (last 3 results) No results for input(s): PROBNP in the last 8760 hours. HbA1C: No results for input(s): HGBA1C in  the last 72 hours. CBG: No results for input(s): GLUCAP in the last 168 hours. Lipid Profile: No results for input(s): CHOL, HDL, LDLCALC, TRIG, CHOLHDL, LDLDIRECT in the last 72 hours. Thyroid  Function Tests: Recent Labs    09/24/23 0252  TSH 2.247   Anemia Panel: No results for input(s): VITAMINB12, FOLATE, FERRITIN, TIBC, IRON, RETICCTPCT in the last 72 hours. Sepsis Labs: Recent Labs  Lab 09/23/23 1523 09/23/23 2050 09/24/23 0733  PROCALCITON  --  0.16  --   LATICACIDVEN 1.3  --  1.5    Recent Results (from the past 240 hours)  Resp panel by RT-PCR (RSV, Flu A&B, Covid) Anterior Nasal Swab     Status: None   Collection Time: 09/23/23  7:48 PM   Specimen: Anterior Nasal Swab  Result Value Ref Range Status   SARS Coronavirus 2 by RT PCR NEGATIVE NEGATIVE Final   Influenza A by PCR NEGATIVE NEGATIVE Final   Influenza B by PCR NEGATIVE NEGATIVE Final    Comment: (NOTE) The Xpert Xpress SARS-CoV-2/FLU/RSV plus assay is intended as an aid in the diagnosis of influenza from Nasopharyngeal swab specimens and should not be used as a sole basis for treatment. Nasal washings and aspirates are unacceptable for Xpert Xpress SARS-CoV-2/FLU/RSV testing.  Fact Sheet for Patients: BloggerCourse.com  Fact Sheet for Healthcare  Providers: SeriousBroker.it  This test is not yet approved or cleared by the [place]  FDA and has been authorized for detection and/or diagnosis of SARS-CoV-2 by FDA under an Emergency Use Authorization (EUA). This EUA will remain in effect (meaning this test can be used) for the duration of the COVID-19 declaration under Section 564(b)(1) of the Act, 21 U.S.C. section 360bbb-3(b)(1), unless the authorization is terminated or revoked.     Resp Syncytial Virus by PCR NEGATIVE NEGATIVE Final    Comment: (NOTE) Fact Sheet for Patients: BloggerCourse.com  Fact Sheet for Healthcare Providers: SeriousBroker.it  This test is not yet approved or cleared by the [place]  FDA and has been authorized for detection and/or diagnosis of SARS-CoV-2 by FDA under an Emergency Use Authorization (EUA). This EUA will remain in effect (meaning this test can be used) for the duration of the COVID-19 declaration under Section 564(b)(1) of the Act, 21 U.S.C. section 360bbb-3(b)(1), unless the authorization is terminated or revoked.  Performed at [facility], [address] [place], [place] [address]   Respiratory (~20 pathogens) panel by PCR     Status: None   Collection Time: 09/23/23  7:48 PM   Specimen: Nasopharyngeal Swab; Respiratory  Result Value Ref Range Status   Adenovirus NOT DETECTED NOT DETECTED Final   Coronavirus 229E NOT DETECTED NOT DETECTED Final    Comment: (NOTE) The Coronavirus on the Respiratory Panel, DOES NOT test for the novel  Coronavirus (2019 nCoV)    Coronavirus HKU1 NOT DETECTED NOT DETECTED Final   Coronavirus NL63 NOT DETECTED NOT DETECTED Final   Coronavirus OC43 NOT DETECTED NOT DETECTED Final   Metapneumovirus NOT DETECTED NOT DETECTED Final   Rhinovirus / Enterovirus NOT DETECTED NOT DETECTED Final   Influenza A NOT DETECTED NOT DETECTED Final   Influenza B NOT DETECTED  NOT DETECTED Final   Parainfluenza Virus 1 NOT DETECTED NOT DETECTED Final   Parainfluenza Virus 2 NOT DETECTED NOT DETECTED Final   Parainfluenza Virus 3 NOT DETECTED NOT DETECTED Final   Parainfluenza Virus 4 NOT DETECTED NOT DETECTED Final   Respiratory Syncytial Virus NOT DETECTED NOT DETECTED Final   Bordetella pertussis  NOT DETECTED NOT DETECTED Final   Bordetella Parapertussis NOT DETECTED NOT DETECTED Final   Chlamydophila pneumoniae NOT DETECTED NOT DETECTED Final   Mycoplasma pneumoniae NOT DETECTED NOT DETECTED Final    Comment: Performed at [facility], [address] [place], [place] [address]  Culture, blood (routine x 2) Call MD if unable to obtain prior to antibiotics being given     Status: None (Preliminary result)   Collection Time: 09/23/23  9:25 PM   Specimen: BLOOD RIGHT FOREARM  Result Value Ref Range Status   Specimen Description BLOOD RIGHT FOREARM  Final   Special Requests   Final    BOTTLES DRAWN AEROBIC AND ANAEROBIC Blood Culture results may not be optimal due to an inadequate volume of blood received in culture bottles   Culture   Final    NO GROWTH 2 DAYS Performed at [facility], [address] [place], [place] [address]    Report Status PENDING  Incomplete  Culture, blood (routine x 2) Call MD if unable to obtain prior to antibiotics being given     Status: None (Preliminary result)   Collection Time: 09/23/23  9:30 PM   Specimen: BLOOD  Result Value Ref Range Status   Specimen Description BLOOD LEFT ANTECUBITAL  Final   Special Requests   Final    BOTTLES DRAWN AEROBIC AND ANAEROBIC Blood Culture results may not be optimal due to an inadequate volume of blood received in culture bottles   Culture   Final    NO GROWTH 2 DAYS Performed at [facility], [address] [place], [place] [address]    Report Status PENDING  Incomplete  MRSA Next Gen by PCR, Nasal     Status: None   Collection Time: 09/23/23 10:41 PM   Specimen:  Nasopharyngeal Swab; Nasal Swab  Result Value Ref Range Status   MRSA by PCR Next Gen NOT DETECTED NOT DETECTED Final    Comment: (NOTE) The GeneXpert MRSA Assay (FDA approved for NASAL specimens only), is one component of a comprehensive MRSA colonization surveillance program. It is not intended to diagnose MRSA infection nor to guide or monitor treatment for MRSA infections. Test performance is not FDA approved in patients less than 44 years old. Performed at [facility], [address], [place], [place] [address]          Radiology Studies: CT HEAD WO CONTRAST ( ) Result Date: 09/23/2023 CLINICAL DATA:  Altered mental status EXAM: CT HEAD WITHOUT CONTRAST TECHNIQUE: Contiguous axial images were obtained from the base of the skull through the vertex without intravenous contrast. RADIATION DOSE REDUCTION: This exam was performed according to the departmental dose-optimization program which includes automated exposure control, adjustment of the mA and/or kV according to patient size and/or use of iterative reconstruction technique. COMPARISON:  09/05/2023 FINDINGS: Brain: No evidence of acute infarction, hemorrhage, hydrocephalus, extra-axial collection or mass lesion/mass effect. Chronic atrophic and ischemic changes are noted. Vascular: No hyperdense vessel or unexpected calcification. Skull: Normal. Negative for fracture or focal lesion. Sinuses/Orbits: No acute finding. Other: None. IMPRESSION: Chronic atrophic and ischemic changes without acute abnormality. Electronically Signed   By: Oneil Devonshire M.D.   On: 09/23/2023 23:14   CT ABDOMEN PELVIS W CONTRAST Result Date: 09/23/2023 EXAM: CT ABDOMEN AND PELVIS WITH CONTRAST 09/23/2023 04:51:42 PM TECHNIQUE: CT of the abdomen and pelvis was performed with the administration of intravenous contrast (75mL iohexol  (OMNIPAQUE ) 350 MG/ML injection). Multiplanar reformatted images are provided  for review. Automated exposure control,  iterative reconstruction, and/or weight-based adjustment of the mA/kV was utilized to reduce the radiation dose to as low as reasonably achievable. COMPARISON: 09/07/2023 CLINICAL HISTORY: Abdominal pain, acute, nonlocalized. Chief complaints; Nausea; Emesis; Weakness; Chest Pain. FINDINGS: LOWER CHEST: Small pleural effusions left greater than right as before. Consolidation/atelectasis in the lung bases left greater than right as before. LIVER: Probable cyst in hepatic segment 4a, stable. GALLBLADDER AND BILE DUCTS: Cholecystectomy clips. SPLEEN: No acute abnormality. PANCREAS: No acute abnormality. ADRENAL GLANDS: No acute abnormality. KIDNEYS, URETERS AND BLADDER: No stones in the kidneys or ureters. No hydronephrosis. No perinephric or periureteral stranding. Urinary bladder is nondistended. GI AND BOWEL: Scattered distal descending and sigmoid diverticula without adjacent inflammatory change. Moderate fecal material mildly distends the rectum, without adjacent inflammatory change. There is no bowel obstruction. PERITONEUM AND RETROPERITONEUM: No ascites. No free air. VASCULATURE: Aorta is normal in caliber. LYMPH NODES: No lymphadenopathy. REPRODUCTIVE ORGANS: Post hysterectomy. BONES AND SOFT TISSUES: Postsurgical decompression L4-5. No acute osseous abnormality. No focal soft tissue abnormality. HEART AND MEDIASTINUM: Small to moderate pericardial effusion has increased. IMPRESSION: 1. No acute findings in the abdomen or pelvis. Electronically signed by: Katheleen Faes MD 09/23/2023 04:58 PM EDT RP Workstation: [id number]   DG Chest Portable 1 View Result Date: 09/23/2023 CLINICAL DATA:  Chest pain.  Nausea and emesis with weakness. EXAM: PORTABLE CHEST 1 VIEW COMPARISON:  CT scan 10/08/2023 and chest radiograph 09/05/2023 FINDINGS: The patient is rotated to the right on today's radiograph, reducing diagnostic sensitivity and specificity. Low lung volumes are present, causing crowding of the pulmonary  vasculature. Moderate cardiomegaly noted. Obscured left hemidiaphragm compatible with known left pleural effusion and left basilar atelectasis. Lesser degree of indistinctness of the right hemidiaphragm favoring peripheral atelectasis in the right lower lobe. This is roughly similar to the appearance on the CT scan from 09/07/2023. Atherosclerotic calcification of the aortic arch. Degenerative glenohumeral arthropathy. Bony demineralization. IMPRESSION: 1. Moderate cardiomegaly. 2. Left pleural effusion and left basilar atelectasis. 3. Mild right basilar atelectasis. 4. Low lung volumes. 5. Degenerative glenohumeral arthropathy. 6. Bony demineralization. Electronically Signed   By: Ryan Salvage M.D.   On: 09/23/2023 16:14         LOS: 2 days   Time spent= 41 mins    Deliliah Room, MD [facility]  If 7PM-7AM, please contact night-coverage  09/25/2023, 10:33 AM

## 2023-09-25 NOTE — TOC Initial Note (Signed)
 Transition of Care [facility]) - Initial/Assessment Note    Patient Details  Name: Donna Sexton MRN: [id number] Date of Birth: 08-04-1950  [facility]) CM/SW Contact:    Montie LOISE Louder, LCSW Phone Number: 09/25/2023, 11:04 AM  Clinical Narrative:        CSW met with patient and her ex-husband, Lorene. He confirmed patient arrived from [facility] and she is expected to return once medically stable.      Montie Louder, MSW, LCSW Clinical Social Worker             Expected Discharge Plan: Skilled Nursing Facility Barriers to Discharge: English as a second language teacher, Continued Medical Work up   Patient Goals and CMS Choice            Expected Discharge Plan and Services In-house Referral: Clinical Social Work     Living arrangements for the past 2 months: Skilled Nursing Facility                                      Prior Living Arrangements/Services Living arrangements for the past 2 months: Skilled Nursing Facility Lives with:: Self Patient language and need for interpreter reviewed:: No        Need for Family Participation in Patient Care: Yes (Comment) Care giver support system in place?: Yes (comment)   Criminal Activity/Legal Involvement Pertinent to Current Situation/Hospitalization: No - Comment as needed  Activities of Daily Living      Permission Sought/Granted                  Emotional Assessment Appearance:: Appears older than stated age   Affect (typically observed):  (somnolent) Orientation: : Fluctuating Orientation (Suspected and/or reported Sundowners) Alcohol / Substance Use: Not Applicable Psych Involvement: No (comment)  Admission diagnosis:  Acute respiratory failure with hypoxia (HCC) [J96.01] Intractable nausea and vomiting [R11.2] Patient Active Problem List   Diagnosis Date Noted   Pericardial effusion 09/23/2023   Acute respiratory failure with hypoxia (HCC) 09/05/2023   Acute on chronic  diastolic CHF (congestive heart failure) (HCC) 09/05/2023   Macrocytic anemia 09/05/2023   Generalized weakness 07/26/2023   Acute metabolic encephalopathy 07/26/2023   CAP (community acquired pneumonia) 07/21/2023   Paroxysmal atrial fibrillation (HCC) 07/21/2023   Class 3 obesity 07/21/2023   Fall 07/21/2023   Physical deconditioning 07/21/2023   Concern about memory 04/14/2023   Tremor 04/14/2023   Encounter for monitoring amiodarone  therapy 03/21/2023   Hypercoagulable state due to persistent atrial fibrillation (HCC) 03/21/2023   Constipation 03/10/2023   Prediabetes 03/09/2023   Pure hypercholesterolemia 03/09/2023   COVID-19 03/09/2023   Urinary tract infectious disease 03/09/2023   V-tach (HCC) 03/01/2023   Atrial fibrillation with rapid ventricular response (HCC) 03/01/2023   Senile purpura (HCC) 02/16/2023   Palpitations 02/04/2022   DDD (degenerative disc disease), cervical 02/05/2021   Chronic heart failure with preserved ejection fraction (HCC) 05/16/2020   Impaired glucose tolerance 10/10/2019   Morbid obesity (HCC) 10/09/2019   Tobacco non-user 02/24/2019   Acute pain of right shoulder 02/23/2018   GAD (generalized anxiety disorder) 12/20/2017   Primary osteoarthritis of both knees 01/23/2017   Localized, primary osteoarthritis of hand, left 01/23/2017   Lumbar radiculitis 09/07/2015   IBS (irritable bowel syndrome) 08/07/2015   Elevated LFTs 05/18/2015   Hyperglycemia 05/12/2015   Lumbago 11/05/2014   Basal cell papilloma 09/11/2014   Restless leg 04/16/2014   Muscle  spasm 02/27/2014   Overactive bladder 04/09/2013   Abnormal mammogram 06/22/2012   Bipolar disorder (HCC) 09/29/2011   Hyperlipidemia 09/29/2011   Essential hypertension 09/29/2011   Telogen effluvium 08/16/2011   Calcium  blood increased 01/11/2011   Clinical depression 10/04/2010   Anal bleeding 10/04/2010   Cannot sleep 10/04/2010   GERD 07/28/2009   COMMON MIGRAINE 12/29/2008    OSTEOPENIA 10/30/2007   PCP:  Alvan Dorothyann BIRCH, MD Pharmacy:   [facility] - [place], [place] - [address] [place] [address] Phone: [phone] Fax: [phone]     Social Drivers of Health (SDOH) Social History: SDOH Screenings   Food Insecurity: Patient Unable To Answer (09/24/2023)  Recent Concern: Food Insecurity - Food Insecurity Present (06/26/2023)  Housing: Unknown (09/24/2023)  Transportation Needs: Patient Unable To Answer (09/24/2023)  Utilities: Patient Unable To Answer (09/24/2023)  Alcohol Screen: Low Risk  (03/28/2022)  Depression (PHQ2-9): Medium Risk (06/26/2023)  Financial Resource Strain: Low Risk  (06/26/2023)  Physical Activity: Inactive (06/26/2023)  Social Connections: Unknown (09/24/2023)  Recent Concern: Social Connections - Socially Isolated (09/05/2023)  Stress: Stress Concern Present (06/26/2023)  Tobacco Use: Low Risk  (09/23/2023)   SDOH Interventions:     Readmission Risk Interventions    09/09/2023   12:46 PM  Readmission Risk Prevention Plan  Transportation Screening Complete  PCP or Specialist Appt within 3-5 Days Complete  HRI or Home Care Consult Complete  Social Work Consult for Recovery Care Planning/Counseling Complete  Palliative Care Screening Not Applicable  Medication Review Oceanographer) Complete

## 2023-09-25 NOTE — Progress Notes (Signed)
 Progress Note  Patient Name: Donna Sexton Date of Encounter: 09/25/2023  Primary Cardiologist:   Peter Swazil[place], MD   Subjective   She is mildly confused and somnolent but knows where she is and why she is here.   Inpatient Medications    Scheduled Meds:  amiodarone   200 mg Oral Daily   apixaban   5 mg Oral BID   atorvastatin   40 mg Oral Daily   budesonide  (PULMICORT ) nebulizer solution  0.25 mg Nebulization BID   diltiazem   120 mg Oral Daily   divalproex   1,500 mg Oral QHS   famotidine   20 mg Oral Daily   fluticasone  furoate-vilanterol  1 puff Inhalation Daily   guaiFENesin   600 mg Oral BID   metoprolol  tartrate  25 mg Oral BID   mirabegron  ER  25 mg Oral QHS   polyethylene glycol  17 g Oral Daily   senna-docusate  2 tablet Oral QHS   Continuous Infusions:  sodium chloride  50 mL/hr at 09/24/23 0305   ceFEPime  (MAXIPIME ) IV 2 g (09/25/23 0445)   sodium chloride      PRN Meds: acetaminophen  **OR** acetaminophen , bisacodyl , HYDROcodone -acetaminophen , ipratropium-albuterol , metoCLOPramide  (REGLAN ) injection, ondansetron  **OR** ondansetron  (ZOFRAN ) IV   Vital Signs    Vitals:   09/24/23 2120 09/24/23 2302 09/25/23 0300 09/25/23 0752  BP: 107/70   126/78  Pulse: (!) 106   100  Resp:  (!) 22  20  Temp:  99 F (37.2 C) 99.2 F (37.3 C) 99 F (37.2 C)  TempSrc:  Oral Oral Oral  SpO2:    96%    Intake/Output Summary (Last 24 hours) at 09/25/2023 1046 Last data filed at 09/25/2023 0400 Gross per 24 hour  Intake 1318.78 ml  Output 400 ml  Net 918.78 ml   There were no vitals filed for this visit.  Telemetry    Atrial fib with controlled rate - Personally Reviewed  ECG    NA - Personally Reviewed  Physical Exam   GEN: No acute distress.   Neck: No  JVD Cardiac: Irregular RR, no murmurs, rubs, or gallops.  Respiratory:     Decreased breath sounds at the bases GI: Soft, nontender, non-distended  MS: No  edema; No deformity. Neuro:  Nonfocal  Psych:  Normal affect   Labs    Chemistry Recent Labs  Lab 09/23/23 1516 09/24/23 0103 09/24/23 0252  NA 137 136 137  K 3.7 4.1 4.1  CL 99  --  99  CO2 28  --  22  GLUCOSE 165*  --  128*  BUN 13  --  16  CREATININE 0.77  --  0.81  CALCIUM  9.2  --  9.3  PROT 5.9*  --  6.0*  ALBUMIN 3.2*  --  3.1*  AST 15  --  17  ALT 11  --  10  ALKPHOS 52  --  51  BILITOT 1.2  --  1.7*  GFRNONAA >60  --  >60  ANIONGAP 10  --  16*     Hematology Recent Labs  Lab 09/23/23 1516 09/24/23 0103 09/24/23 0252  WBC 15.4*  --  17.7*  RBC 3.78*  --  4.00  HGB 12.8 12.2 13.5  HCT 38.8 36.0 41.3  MCV 102.6*  --  103.3*  MCH 33.9  --  33.8  MCHC 33.0  --  32.7  RDW 13.5  --  13.7  PLT 361  --  338    Cardiac EnzymesNo results for input(s): TROPONINI in  the last 168 hours. No results for input(s): TROPIPOC in the last 168 hours.   BNP Recent Labs  Lab 09/23/23 1516  BNP 227.9*     DDimer No results for input(s): DDIMER in the last 168 hours.   Radiology    CT HEAD WO CONTRAST ( ) Result Date: 09/23/2023 CLINICAL DATA:  Altered mental status EXAM: CT HEAD WITHOUT CONTRAST TECHNIQUE: Contiguous axial images were obtained from the base of the skull through the vertex without intravenous contrast. RADIATION DOSE REDUCTION: This exam was performed according to the departmental dose-optimization program which includes automated exposure control, adjustment of the mA and/or kV according to patient size and/or use of iterative reconstruction technique. COMPARISON:  09/05/2023 FINDINGS: Brain: No evidence of acute infarction, hemorrhage, hydrocephalus, extra-axial collection or mass lesion/mass effect. Chronic atrophic and ischemic changes are noted. Vascular: No hyperdense vessel or unexpected calcification. Skull: Normal. Negative for fracture or focal lesion. Sinuses/Orbits: No acute finding. Other: None. IMPRESSION: Chronic atrophic and ischemic changes without acute abnormality.  Electronically Signed   By: Oneil Devonshire M.D.   On: 09/23/2023 23:14   CT ABDOMEN PELVIS W CONTRAST Result Date: 09/23/2023 EXAM: CT ABDOMEN AND PELVIS WITH CONTRAST 09/23/2023 04:51:42 PM TECHNIQUE: CT of the abdomen and pelvis was performed with the administration of intravenous contrast (75mL iohexol  (OMNIPAQUE ) 350 MG/ML injection). Multiplanar reformatted images are provided for review. Automated exposure control, iterative reconstruction, and/or weight-based adjustment of the mA/kV was utilized to reduce the radiation dose to as low as reasonably achievable. COMPARISON: 09/07/2023 CLINICAL HISTORY: Abdominal pain, acute, nonlocalized. Chief complaints; Nausea; Emesis; Weakness; Chest Pain. FINDINGS: LOWER CHEST: Small pleural effusions left greater than right as before. Consolidation/atelectasis in the lung bases left greater than right as before. LIVER: Probable cyst in hepatic segment 4a, stable. GALLBLADDER AND BILE DUCTS: Cholecystectomy clips. SPLEEN: No acute abnormality. PANCREAS: No acute abnormality. ADRENAL GLANDS: No acute abnormality. KIDNEYS, URETERS AND BLADDER: No stones in the kidneys or ureters. No hydronephrosis. No perinephric or periureteral stranding. Urinary bladder is nondistended. GI AND BOWEL: Scattered distal descending and sigmoid diverticula without adjacent inflammatory change. Moderate fecal material mildly distends the rectum, without adjacent inflammatory change. There is no bowel obstruction. PERITONEUM AND RETROPERITONEUM: No ascites. No free air. VASCULATURE: Aorta is normal in caliber. LYMPH NODES: No lymphadenopathy. REPRODUCTIVE ORGANS: Post hysterectomy. BONES AND SOFT TISSUES: Postsurgical decompression L4-5. No acute osseous abnormality. No focal soft tissue abnormality. HEART AND MEDIASTINUM: Small to moderate pericardial effusion has increased. IMPRESSION: 1. No acute findings in the abdomen or pelvis. Electronically signed by: Katheleen Faes MD 09/23/2023 04:58 PM  EDT RP Workstation: [id number]   DG Chest Portable 1 View Result Date: 09/23/2023 CLINICAL DATA:  Chest pain.  Nausea and emesis with weakness. EXAM: PORTABLE CHEST 1 VIEW COMPARISON:  CT scan 10/08/2023 and chest radiograph 09/05/2023 FINDINGS: The patient is rotated to the right on today's radiograph, reducing diagnostic sensitivity and specificity. Low lung volumes are present, causing crowding of the pulmonary vasculature. Moderate cardiomegaly noted. Obscured left hemidiaphragm compatible with known left pleural effusion and left basilar atelectasis. Lesser degree of indistinctness of the right hemidiaphragm favoring peripheral atelectasis in the right lower lobe. This is roughly similar to the appearance on the CT scan from 09/07/2023. Atherosclerotic calcification of the aortic arch. Degenerative glenohumeral arthropathy. Bony demineralization. IMPRESSION: 1. Moderate cardiomegaly. 2. Left pleural effusion and left basilar atelectasis. 3. Mild right basilar atelectasis. 4. Low lung volumes. 5. Degenerative glenohumeral arthropathy. 6. Bony demineralization. Electronically Signed   By: Ryan  Ramond M.D.   On: 09/23/2023 16:14    Cardiac Studies   Echo:  See prelim below.  Final reading pending.   Patient Profile     73 y.o. female with medical history significant of bipolar disorder, hypertension, OSA, paroxysmal atrial fibrillation, chronic diastolic heart failure who presented with acute metabolic encephalopathy we are consulted for atrial fibrillation.  She presented with nausea/vomiting and confusion.  Noted to be in A-fib with heart rates in the 100s.   Assessment & Plan    PAF:  History of PAF now persistent with acute illness.  Rates controlled.  Continue amio and Dilt.     Pericardial effusion.   Final echo pending.  Small circumferential pericardial effusion.  No echocardiographic evidence of pericardial effusion.  Not suspecting pericarditis.  Possibly reactive to acute lung  infection.  We will follow as an out patient.    For questions or updates, please contact [facility] Please consult [email or web address] for contact info under Cardiology/STEMI.   Signed, Lynwood Schilling, MD  09/25/2023, 10:46 AM

## 2023-09-25 NOTE — Plan of Care (Signed)
  Problem: Activity: Goal: Ability to tolerate increased activity will improve 09/25/2023 0355 by Marvis Kenneth SAILOR, RN Outcome: Progressing 09/25/2023 0354 by Marvis Kenneth SAILOR, RN Outcome: Progressing 09/24/2023 2018 by Marvis Kenneth SAILOR, RN Outcome: Progressing   Problem: Clinical Measurements: Goal: Ability to maintain a body temperature in the normal range will improve 09/25/2023 0355 by Marvis Kenneth SAILOR, RN Outcome: Progressing 09/25/2023 0354 by Marvis Kenneth SAILOR, RN Outcome: Progressing 09/24/2023 2018 by Marvis Kenneth SAILOR, RN Outcome: Progressing   Problem: Respiratory: Goal: Ability to maintain adequate ventilation will improve 09/25/2023 0355 by Marvis Kenneth SAILOR, RN Outcome: Progressing 09/25/2023 0354 by Marvis Kenneth SAILOR, RN Outcome: Progressing 09/24/2023 2018 by Marvis Kenneth SAILOR, RN Outcome: Progressing Goal: Ability to maintain a clear airway will improve 09/25/2023 0355 by Marvis Kenneth SAILOR, RN Outcome: Progressing 09/25/2023 0354 by Marvis Kenneth SAILOR, RN Outcome: Progressing 09/24/2023 2018 by Marvis Kenneth SAILOR, RN Outcome: Progressing

## 2023-09-25 NOTE — Plan of Care (Signed)
  Problem: Activity: Goal: Ability to tolerate increased activity will improve 09/25/2023 0354 by Marvis Kenneth SAILOR, RN Outcome: Progressing 09/24/2023 2018 by Marvis Kenneth SAILOR, RN Outcome: Progressing   Problem: Clinical Measurements: Goal: Ability to maintain a body temperature in the normal range will improve 09/25/2023 0354 by Marvis Kenneth SAILOR, RN Outcome: Progressing 09/24/2023 2018 by Marvis Kenneth SAILOR, RN Outcome: Progressing   Problem: Respiratory: Goal: Ability to maintain adequate ventilation will improve 09/25/2023 0354 by Marvis Kenneth SAILOR, RN Outcome: Progressing 09/24/2023 2018 by Marvis Kenneth SAILOR, RN Outcome: Progressing Goal: Ability to maintain a clear airway will improve 09/25/2023 0354 by Marvis Kenneth SAILOR, RN Outcome: Progressing 09/24/2023 2018 by Marvis Kenneth SAILOR, RN Outcome: Progressing   Problem: Activity: Goal: Ability to tolerate increased activity will improve 09/25/2023 0354 by Marvis Kenneth SAILOR, RN Outcome: Progressing 09/24/2023 2018 by Marvis Kenneth SAILOR, RN Outcome: Progressing   Problem: Clinical Measurements: Goal: Ability to maintain a body temperature in the normal range will improve 09/25/2023 0354 by Marvis Kenneth SAILOR, RN Outcome: Progressing 09/24/2023 2018 by Marvis Kenneth SAILOR, RN Outcome: Progressing   Problem: Respiratory: Goal: Ability to maintain adequate ventilation will improve 09/25/2023 0354 by Marvis Kenneth SAILOR, RN Outcome: Progressing 09/24/2023 2018 by Marvis Kenneth SAILOR, RN Outcome: Progressing Goal: Ability to maintain a clear airway will improve 09/25/2023 0354 by Marvis Kenneth SAILOR, RN Outcome: Progressing 09/24/2023 2018 by Marvis Kenneth SAILOR, RN Outcome: Progressing   Problem: Activity: Goal: Ability to tolerate increased activity will improve 09/25/2023 0354 by Marvis Kenneth SAILOR, RN Outcome: Progressing 09/24/2023 2018 by Marvis Kenneth SAILOR, RN Outcome: Progressing   Problem: Clinical  Measurements: Goal: Ability to maintain a body temperature in the normal range will improve 09/25/2023 0354 by Marvis Kenneth SAILOR, RN Outcome: Progressing 09/24/2023 2018 by Marvis Kenneth SAILOR, RN Outcome: Progressing   Problem: Respiratory: Goal: Ability to maintain adequate ventilation will improve 09/25/2023 0354 by Marvis Kenneth SAILOR, RN Outcome: Progressing 09/24/2023 2018 by Marvis Kenneth SAILOR, RN Outcome: Progressing Goal: Ability to maintain a clear airway will improve 09/25/2023 0354 by Marvis Kenneth SAILOR, RN Outcome: Progressing 09/24/2023 2018 by Marvis Kenneth SAILOR, RN Outcome: Progressing

## 2023-09-25 NOTE — Evaluation (Signed)
 Clinical/Bedside Swallow Evaluation Patient Details  Name: Donna Sexton MRN: [id number] Date of Birth: [birth year]  Today's Date: 09/25/2023 Time: SLP Start Time (ACUTE ONLY): 1554 SLP Stop Time (ACUTE ONLY): 1604 SLP Time Calculation (min) (ACUTE ONLY): 10 min  Past Medical History:  Past Medical History:  Diagnosis Date   Acute hypoxemic respiratory failure due to COVID-19 (HCC) 03/01/2023   Allergy Morphine   ANEMIA 11/12/2007   Qualifier: Diagnosis of  By: Sherron CMA, Cree.Cornelia     Anxiety    Arthritis    ASTHMA NOS W/ACUTE EXACERBATION 01/13/2010   Qualifier: Diagnosis of  By: Joshua MD, Debby LITTIE.    BIPOLAR DISORDER UNSPECIFIED 10/29/2007   Qualifier: Diagnosis of  By: Joshua MD, Debby LITTIE.    CHF (congestive heart failure) (HCC)    COMMON MIGRAINE 12/29/2008   Qualifier: Diagnosis of  By: Joshua MD, Debby LITTIE.    Encephalopathy due to infection 04/13/2023   GERD 07/28/2009   Qualifier: Diagnosis of  By: Inocencio MD, Ber[place] LABOR    HYPERLIPIDEMIA 10/29/2007   Qualifier: Diagnosis of  By: Joshua MD, Debby LITTIE.    HYPERTENSION, BENIGN ESSENTIAL 10/29/2007   Qualifier: Diagnosis of  By: Joshua MD, Debby LITTIE.    Orbital mass, right 03/13/2023   OSTEOPENIA 10/30/2007   Qualifier: Diagnosis of  By: Joshua MD, Debby LITTIE.    OVERACTIVE BLADDER 02/21/2008   Qualifier: Diagnosis of  By: Norleen MD, Lynwood ORN    SLEEP APNEA 12/31/2009   Qualifier: Diagnosis of  By: Joshua MD, Debby LITTIE.    Trigger finger, left little finger 03/15/2017   Past Surgical History:  Past Surgical History:  Procedure Laterality Date   ABDOMINAL HYSTERECTOMY     BREAST IMPLANT REMOVAL Bilateral 2022   BREAST SURGERY  01/10/1978   implants bilateral   CARPAL TUNNEL RELEASE     CHOLECYSTECTOMY     COSMETIC SURGERY     EYE SURGERY  cataract both eyes   HERNIA REPAIR     JOINT REPLACEMENT     PAROTID ENDOSCOPY     SPINE SURGERY  lower back   TUBAL LIGATION     HPI:  Pt is a 73 y.o. female presenting 9/13 with  chest pain, confusion, shortness of breath, N/V. Admitted for acute respiratory failure with hypoxia, metabolic encephalopathy. CT reveals Left pleural effusion and left basilar atelectasis.  Recent hospitalization 8/26-9/2 for UTI/sepsis.  PMH: bipolar disorder, anemia, anxiety, asthma GERD, IBS, HLD, HTN, osteopenia, overactive bladder, sleep apnea, prediabetes, recurrent UTIs, paroxysmal atrial fibrillation, chronic diastolic CHF    Assessment / Plan / Recommendation  Clinical Impression  Pt presents with functional oropharyngeal swallow. She was sleepy but oriented and appropriate, able to follow basic commands. She accepted sips of water, sequentially from a straw, with no concerns for aspiraition. Ate purees with sufficient oral attention, no concerns for residue or impaired airway protection.  No s/s of a dysphagia - recommend allowing full liquid diet until she wakes up. Then advance to regular, thin liquids when she is consistently alert. No skilled SLP intervention needed. D/W RN. SLP will sign off. SLP Visit Diagnosis: Dysphagia, unspecified (R13.10)    Aspiration Risk    Likely low   Diet Recommendation    (full liquids)  Medication Administration: Whole meds with liquid    Other  Recommendations Oral Care Recommendations: Oral care BID  Prognosis   Good for diet advancement     Swallow Study   General Date of Onset: 09/23/23 HPI: Pt is a 73 y.o. female presenting 9/13 with chest pain, confusion, shortness of breath, N/V. Admitted for acute respiratory failure with hypoxia, metabolic encephalopathy. CT reveals Left pleural effusion and left basilar atelectasis.  Recent hospitalization 8/26-9/2 for UTI/sepsis.  PMH: bipolar disorder, anemia, anxiety, asthma GERD, IBS, HLD, HTN, osteopenia, overactive bladder, sleep apnea, prediabetes, recurrent UTIs, paroxysmal atrial fibrillation, chronic diastolic CHF Type of Study: Bedside Swallow  Evaluation Previous Swallow Assessment: 07/24/23 bedside swallow eval, no dysphagia Diet Prior to this Study: Clear liquid diet Temperature Spikes Noted: No Respiratory Status: Nasal cannula History of Recent Intubation: No Behavior/Cognition: Lethargic/Drowsy Oral Cavity Assessment: Within Functional Limits Oral Care Completed by SLP: No Oral Cavity - Dentition: Adequate natural dentition Vision: Functional for self-feeding Self-Feeding Abilities: Needs assist Patient Positioning: Upright in bed Baseline Vocal Quality: Normal Volitional Cough: Strong Volitional Swallow: Able to elicit    Oral/Motor/Sensory Function Overall Oral Motor/Sensory Function: Within functional limits   Ice Chips Ice chips: Within functional limits   Thin Liquid Thin Liquid: Within functional limits    Nectar Thick Nectar Thick Liquid: Not tested   Honey Thick Honey Thick Liquid: Not tested   Puree Puree: Within functional limits   Solid     Solid: Not tested      Vona Palma Laurice 09/25/2023,4:10 PM  Palma L. Vona, MA CCC/SLP Clinical Specialist - Acute Care SLP Acute Rehabilitation Services Office number [phone]

## 2023-09-25 NOTE — Progress Notes (Addendum)
 Initial Nutrition Assessment  DOCUMENTATION CODES:   Not applicable  INTERVENTION:  Encourage PO intake  Ensure Plus High Protein po BID, each supplement provides 350 kcal and 20 grams of protein MVI with minerals daily 100 mg Thiamine  daily    NUTRITION DIAGNOSIS:   Inadequate oral intake related to acute illness as evidenced by meal completion < 25%.  GOAL:   Patient will meet greater than or equal to 90% of their needs  MONITOR:   PO intake, Supplement acceptance, Diet advancement, Labs  REASON FOR ASSESSMENT:   Consult Assessment of nutrition requirement/status  ASSESSMENT:  73 y.o. female with PMH of bipolar disorder, anemia and anxiety, asthma GERD, IBS, HL,D HTN, osteopenia, overactive bladder, sleep apnea, prediabetes, recurrent UTIs ,PAF, ,chronic diastolic CHF, Presented with nausea, vomiting and altered confusion. Recent admit with UTI and sepsis.   9/13 - Chest x-ray showing left pleural effusion and left basilar atelectasis right mild right basilar atelectasis low lung volumes, possible pneumonia   Pt lying in bed, lethargic unable to provide history. History obtained through ex husband at bedside.   Pt has had multiple recurrent UTIs this year which she has had to be hospitalized and then sent to rehab where she was discharged to home. He reports every time she would go home she would end up back in the hospital. Pt recently admitted with UTI and sepsis, and was discharged to rehab center on 9/2. Family reports pt was doing well there and was progressing, going from a wheelchair to a walker. Ex-husband unsure how she has been eating since in rehab however pt responded with not good before falling back asleep.   Ex-husband reports pt has had fluid issues since 2019. He does not know her dry weight and unable to get pt to respond to question. RD obtained new bed weight of 234 lbs. E-husband reports she use to be around 250 lbs. Weight history fluctuates, unable  to determine if weight loss is due to fluid. Unable to access fat loss/muscle loss due to fluid. Likely pt is malnourished  due to being hospitalized multiple times this year however, unable to diagnosis at this time due to limited history available.   Pt on CLD but has not had much intake. Drinking water and diet coke. Ex husband reports last real meal pt had was Friday evening. Pt now day 3 of poor po intake. Speech saw and placed on FLD. Will monitor for diet advancement. If pt continues with poor PO intake may need nutrition support.   Admit weight: 106.1 kg - fluid  Current weight: 106.1 kg  Unknown dry weight  +1 Edema RLE, LLE Non pitting RUE, LUE  Average Meal Intake: N/A  Nutritionally Relevant Medications: Scheduled Meds:  famotidine   20 mg Oral Daily   feeding supplement  1 Container Oral TID BM   multivitamin with minerals  1 tablet Oral Daily   polyethylene glycol  17 g Oral Daily   senna-docusate  2 tablet Oral QHS   thiamine   100 mg Oral Daily   Continuous Infusions:  ceFEPime  (MAXIPIME ) IV 2 g (09/25/23 1130)   sodium chloride      Labs Reviewed: Phosphorus 4.9 Total Bilirubin 1.7 CBG ranges from 128-165 mg/dL over the last 24 hours HgbA1c 6.2  NUTRITION - FOCUSED PHYSICAL EXAM:  Flowsheet Row Most Recent Value  Orbital Region No depletion  Upper Arm Region Unable to assess  Thoracic and Lumbar Region No depletion  Buccal Region No depletion  Temple Region No depletion  Clavicle Bone Region No depletion  Clavicle and Acromion Bone Region Mild depletion  Scapular Bone Region Mild depletion  Dorsal Hand No depletion  Patellar Region Unable to assess  Anterior Thigh Region Unable to assess  Posterior Calf Region Unable to assess  Edema (RD Assessment) Moderate  Hair Reviewed  Eyes Reviewed  Mouth Reviewed  [No upper teeth]  Skin Reviewed  Nails Reviewed    Diet Order:   Diet Order             Diet clear liquid Room service appropriate? Yes; Fluid  consistency: Thin  Diet effective now                   EDUCATION NEEDS:   Not appropriate for education at this time  Skin:  Skin Assessment: Reviewed RN Assessment  Last BM:  PTA  Height:   Ht Readings from Last 1 Encounters:  09/05/23 5' 4 (1.626 m)    Weight:   Wt Readings from Last 1 Encounters:  09/25/23 106.1 kg    Ideal Body Weight:  54.5 kg  BMI:  Body mass index is 40.15 kg/m.  Estimated Nutritional Needs:   Kcal:  1900-2100 kcal  Protein:  110-130 gm  Fluid:  >1.9L/day   Olivia Kenning, RD Registered Dietitian  See Amion for more information

## 2023-09-26 ENCOUNTER — Inpatient Hospital Stay (HOSPITAL_COMMUNITY)

## 2023-09-26 DIAGNOSIS — J9601 Acute respiratory failure with hypoxia: Secondary | ICD-10-CM | POA: Diagnosis not present

## 2023-09-26 DIAGNOSIS — I3139 Other pericardial effusion (noninflammatory): Secondary | ICD-10-CM | POA: Diagnosis not present

## 2023-09-26 LAB — CBC WITH DIFFERENTIAL/PLATELET
Abs Immature Granulocytes: 0.06 K/uL (ref 0.00–0.07)
Basophils Absolute: 0.1 K/uL (ref 0.0–0.1)
Basophils Relative: 1 %
Eosinophils Absolute: 0.1 K/uL (ref 0.0–0.5)
Eosinophils Relative: 1 %
HCT: 29.5 % — ABNORMAL LOW (ref 36.0–46.0)
Hemoglobin: 9.6 g/dL — ABNORMAL LOW (ref 12.0–15.0)
Immature Granulocytes: 1 %
Lymphocytes Relative: 8 %
Lymphs Abs: 0.8 K/uL (ref 0.7–4.0)
MCH: 33.2 pg (ref 26.0–34.0)
MCHC: 32.5 g/dL (ref 30.0–36.0)
MCV: 102.1 fL — ABNORMAL HIGH (ref 80.0–100.0)
Monocytes Absolute: 1.3 K/uL — ABNORMAL HIGH (ref 0.1–1.0)
Monocytes Relative: 14 %
Neutro Abs: 6.9 K/uL (ref 1.7–7.7)
Neutrophils Relative %: 75 %
Platelets: 185 K/uL (ref 150–400)
RBC: 2.89 MIL/uL — ABNORMAL LOW (ref 3.87–5.11)
RDW: 13.4 % (ref 11.5–15.5)
WBC: 9.1 K/uL (ref 4.0–10.5)
nRBC: 0 % (ref 0.0–0.2)

## 2023-09-26 LAB — BASIC METABOLIC PANEL WITH GFR
Anion gap: 8 (ref 5–15)
BUN: 16 mg/dL (ref 8–23)
CO2: 25 mmol/L (ref 22–32)
Calcium: 8.9 mg/dL (ref 8.9–10.3)
Chloride: 101 mmol/L (ref 98–111)
Creatinine, Ser: 0.67 mg/dL (ref 0.44–1.00)
GFR, Estimated: >60 mL/min (ref >60)
Glucose, Bld: 96 mg/dL (ref 70–99)
Potassium: 3.9 mmol/L (ref 3.5–5.1)
Sodium: 134 mmol/L — ABNORMAL LOW (ref 135–145)

## 2023-09-26 MED ORDER — SODIUM CHLORIDE 0.9 % IV SOLN: INTRAVENOUS | Status: DC

## 2023-09-26 NOTE — Progress Notes (Signed)
 PROGRESS NOTE    Donna Sexton  FMW:[id number] DOB: [birth year] DOA: 09/23/2023 PCP: Alvan Dorothyann BIRCH, MD   Brief Narrative:   73 y.o. female with medical history significant of bipolar disorder anemia and anxiety asthma GERD, IBS, HL,D HTN, osteopenia overactive bladder, sleep apnea, prediabetes, recurrent UTIs ,paroxysmal atrial fibrillation ,chronic diastolic CHF, Presented with nausea, vomiting and altered mentation. She is being treated for a possible PNA. She is persistently altered despite treatment of her possible PNA, so ordered a repeat CT head on 9/16.  Assessment & Plan:  Principal Problem:   Acute respiratory failure with hypoxia (HCC) Active Problems:   Bipolar disorder (HCC)   Hyperlipidemia   Essential hypertension   Chronic heart failure with preserved ejection fraction (HCC)   Atrial fibrillation with rapid ventricular response (HCC)   CAP (community acquired pneumonia)   Constipation   Class 3 obesity   Acute metabolic encephalopathy   Pericardial effusion   Acute metabolic encephalopathy, POA:    - most likely multifactorial secondary to dehydration, polypharmacy and possible PNA. - CT head done in ED didn't show any acute abnormalities  - hold off on diuresis  - Hold sedating medications   - No focal neuro deficits. - Repeat CT head ordered on 9/16.      Acute respiratory failure with hypoxia,POA:  Chest x-ray showing left pleural effusion and left basilar atelectasis right mild right basilar atelectasis and low lung volumes Patient is currently on anticoagulation, continue Eliquis  CT showed Consolidation/atelectasis in the lung bases left greater than right as before. Leukocytosis has resolved now. Treating for possible underlying pneumonia       Atrial fibrillation with rapid ventricular response, POA: rate controlled now Continue diltiazem  p.o. with holding parameters  continue amiodarone  200 mg daily  continue Eliquis  5 mg p.o. twice daily  as per cardiology recommendations Cardiology on board F/u ECHO-EF 50-55%   Bipolar disorder  Continue home medications   Chronic heart failure with preserved ejection fraction Currently, she appears to be somewhat euvolemic   Class 3 obesity -Contributing to comorbidity and complicating medical management     Constipation Bowel regimen ordered   Essential hypertension Prn antihypertensives   Hyperlipidemia Continue Lipitor 40 mg daily  Poor oral intake,POA: Ordered SLP evaluation because she is at high risk of aspiration.   CAP (community acquired pneumonia),possible bacterial Continue with IV Cefepime . Dced Vanc as MRSA Nares is negative.   Pericardial effusion Appreciate cardiology consult ECHO showed large pericardial effusion but no tamponade.  Disposition: Lives at [facility].   DVT prophylaxis: SCDs Start: 09/24/23 0152 apixaban  (ELIQUIS ) tablet 5 mg     Code Status: Full Code Family Communication:  Ex-husband at the bedside Status is: Inpatient Remains inpatient appropriate because: AMS, Sepsis    Subjective:  Still drowsy and feels nauseous. Ex-husband is present at the bedside. I also spoke to her son, Sidra, on the phone. We discussed about getting CT head since her mentation hasn't improved despite treating her infection.   Examination:  General exam: Appears drowsy, able to responds to some questions Respiratory system: Clear to auscultation. Respiratory effort normal. Cardiovascular system: S1 & S2 heard, RRR. No JVD, murmurs, rubs, gallops or clicks. No pedal edema. Gastrointestinal system: Abdomen is nondistended, soft and nontender. No organomegaly or masses felt. Normal bowel sounds heard. Central nervous system: Altered mentation. No focal neurological deficits. Extremities: Symmetric 5 x 5 power. Skin: No rashes, lesions or ulcers        Diet Orders (From  admission, onward)     Start     Ordered   09/25/23 1604  Diet full  liquid Room service appropriate? Yes with Assist; Fluid consistency: Thin  Diet effective now       Question Answer Comment  Room service appropriate? Yes with Assist   Fluid consistency: Thin      09/25/23 1603            Objective: Vitals:   09/25/23 2115 09/25/23 2300 09/26/23 0300 09/26/23 0815  BP: 109/73 95/63 104/72   Pulse: 100 96 94   Resp:  20 19   Temp:  99.2 F (37.3 C) 99.1 F (37.3 C)   TempSrc:  Axillary Axillary   SpO2:  97% 97% 98%  Weight:        Intake/Output Summary (Last 24 hours) at 09/26/2023 1036 Last data filed at 09/26/2023 0520 Gross per 24 hour  Intake 328.81 ml  Output 550 ml  Net -221.19 ml   Filed Weights   09/25/23 1141  Weight: 106.1 kg    Scheduled Meds:  amiodarone   200 mg Oral Daily   apixaban   5 mg Oral BID   atorvastatin   40 mg Oral Daily   budesonide  (PULMICORT ) nebulizer solution  0.25 mg Nebulization BID   diltiazem   120 mg Oral Daily   divalproex   1,500 mg Oral QHS   famotidine   20 mg Oral Daily   feeding supplement  237 mL Oral BID BM   fluticasone  furoate-vilanterol  1 puff Inhalation Daily   guaiFENesin   600 mg Oral BID   metoprolol  tartrate  25 mg Oral BID   mirabegron  ER  25 mg Oral QHS   multivitamin with minerals  1 tablet Oral Daily   polyethylene glycol  17 g Oral Daily   senna-docusate  2 tablet Oral QHS   thiamine   100 mg Oral Daily   Continuous Infusions:  ceFEPime  (MAXIPIME ) IV 2 g (09/26/23 0511)    Nutritional status Signs/Symptoms: meal completion < 25% Interventions: Refer to RD note for recommendations Body mass index is 40.15 kg/m.  Data Reviewed:   CBC: Recent Labs  Lab 09/23/23 1516 09/24/23 0103 09/24/23 0252 09/25/23 1306 09/26/23 0533  WBC 15.4*  --  17.7* 12.9* 9.1  NEUTROABS 11.9*  --   --  9.7* 6.9  HGB 12.8 12.2 13.5 10.2* 9.6*  HCT 38.8 36.0 41.3 30.6* 29.5*  MCV 102.6*  --  103.3* 101.7* 102.1*  PLT 361  --  338 185 185   Basic Metabolic Panel: Recent Labs  Lab  09/23/23 1516 09/23/23 2050 09/24/23 0103 09/24/23 0252 09/26/23 0533  NA 137  --  136 137 134*  K 3.7  --  4.1 4.1 3.9  CL 99  --   --  99 101  CO2 28  --   --  22 25  GLUCOSE 165*  --   --  128* 96  BUN 13  --   --  16 16  CREATININE 0.77  --   --  0.81 0.67  CALCIUM  9.2  --   --  9.3 8.9  MG  --  1.7  --  1.8  --   PHOS  --  4.5  --  4.9*  --    GFR: Estimated Creatinine Clearance: 73.3 mL/min (by C-G formula based on SCr of 0.67 mg/dL). Liver Function Tests: Recent Labs  Lab 09/23/23 1516 09/24/23 0252  AST 15 17  ALT 11 10  ALKPHOS 52 51  BILITOT  1.2 1.7*  PROT 5.9* 6.0*  ALBUMIN 3.2* 3.1*   Recent Labs  Lab 09/23/23 2234  LIPASE 21   No results for input(s): AMMONIA in the last 168 hours. Coagulation Profile: No results for input(s): INR, PROTIME in the last 168 hours. Cardiac Enzymes: Recent Labs  Lab 09/23/23 2050  CKTOTAL 12*   BNP (last 3 results) No results for input(s): PROBNP in the last 8760 hours. HbA1C: No results for input(s): HGBA1C in the last 72 hours. CBG: No results for input(s): GLUCAP in the last 168 hours. Lipid Profile: No results for input(s): CHOL, HDL, LDLCALC, TRIG, CHOLHDL, LDLDIRECT in the last 72 hours. Thyroid  Function Tests: Recent Labs    09/24/23 0252  TSH 2.247   Anemia Panel: No results for input(s): VITAMINB12, FOLATE, FERRITIN, TIBC, IRON, RETICCTPCT in the last 72 hours. Sepsis Labs: Recent Labs  Lab 09/23/23 1523 09/23/23 2050 09/24/23 0733  PROCALCITON  --  0.16  --   LATICACIDVEN 1.3  --  1.5    Recent Results (from the past 240 hours)  Resp panel by RT-PCR (RSV, Flu A&B, Covid) Anterior Nasal Swab     Status: None   Collection Time: 09/23/23  7:48 PM   Specimen: Anterior Nasal Swab  Result Value Ref Range Status   SARS Coronavirus 2 by RT PCR NEGATIVE NEGATIVE Final   Influenza A by PCR NEGATIVE NEGATIVE Final   Influenza B by PCR NEGATIVE NEGATIVE Final     Comment: (NOTE) The Xpert Xpress SARS-CoV-2/FLU/RSV plus assay is intended as an aid in the diagnosis of influenza from Nasopharyngeal swab specimens and should not be used as a sole basis for treatment. Nasal washings and aspirates are unacceptable for Xpert Xpress SARS-CoV-2/FLU/RSV testing.  Fact Sheet for Patients: BloggerCourse.com  Fact Sheet for Healthcare Providers: SeriousBroker.it  This test is not yet approved or cleared by the [place]  FDA and has been authorized for detection and/or diagnosis of SARS-CoV-2 by FDA under an Emergency Use Authorization (EUA). This EUA will remain in effect (meaning this test can be used) for the duration of the COVID-19 declaration under Section 564(b)(1) of the Act, 21 U.S.C. section 360bbb-3(b)(1), unless the authorization is terminated or revoked.     Resp Syncytial Virus by PCR NEGATIVE NEGATIVE Final    Comment: (NOTE) Fact Sheet for Patients: BloggerCourse.com  Fact Sheet for Healthcare Providers: SeriousBroker.it  This test is not yet approved or cleared by the [place]  FDA and has been authorized for detection and/or diagnosis of SARS-CoV-2 by FDA under an Emergency Use Authorization (EUA). This EUA will remain in effect (meaning this test can be used) for the duration of the COVID-19 declaration under Section 564(b)(1) of the Act, 21 U.S.C. section 360bbb-3(b)(1), unless the authorization is terminated or revoked.  Performed at [facility], [address], [place], [place] [address]   Respiratory (~20 pathogens) panel by PCR     Status: None   Collection Time: 09/23/23  7:48 PM   Specimen: Nasopharyngeal Swab; Respiratory  Result Value Ref Range Status   Adenovirus NOT DETECTED NOT DETECTED Final   Coronavirus 229E NOT DETECTED NOT DETECTED Final    Comment: (NOTE) The Coronavirus on the Respiratory  Panel, DOES NOT test for the novel  Coronavirus (2019 nCoV)    Coronavirus HKU1 NOT DETECTED NOT DETECTED Final   Coronavirus NL63 NOT DETECTED NOT DETECTED Final   Coronavirus OC43 NOT DETECTED NOT DETECTED Final   Metapneumovirus NOT DETECTED NOT DETECTED Final  Rhinovirus / Enterovirus NOT DETECTED NOT DETECTED Final   Influenza A NOT DETECTED NOT DETECTED Final   Influenza B NOT DETECTED NOT DETECTED Final   Parainfluenza Virus 1 NOT DETECTED NOT DETECTED Final   Parainfluenza Virus 2 NOT DETECTED NOT DETECTED Final   Parainfluenza Virus 3 NOT DETECTED NOT DETECTED Final   Parainfluenza Virus 4 NOT DETECTED NOT DETECTED Final   Respiratory Syncytial Virus NOT DETECTED NOT DETECTED Final   Bordetella pertussis NOT DETECTED NOT DETECTED Final   Bordetella Parapertussis NOT DETECTED NOT DETECTED Final   Chlamydophila pneumoniae NOT DETECTED NOT DETECTED Final   Mycoplasma pneumoniae NOT DETECTED NOT DETECTED Final    Comment: Performed at [facility], [address], [place], [place] [address]  Culture, blood (routine x 2) Call MD if unable to obtain prior to antibiotics being given     Status: None (Preliminary result)   Collection Time: 09/23/23  9:25 PM   Specimen: BLOOD RIGHT FOREARM  Result Value Ref Range Status   Specimen Description BLOOD RIGHT FOREARM  Final   Special Requests   Final    BOTTLES DRAWN AEROBIC AND ANAEROBIC Blood Culture results may not be optimal due to an inadequate volume of blood received in culture bottles   Culture   Final    NO GROWTH 3 DAYS Performed at [facility], [address], [place], [place] [address]    Report Status PENDING  Incomplete  Culture, blood (routine x 2) Call MD if unable to obtain prior to antibiotics being given     Status: None (Preliminary result)   Collection Time: 09/23/23  9:30 PM   Specimen: BLOOD  Result Value Ref Range Status   Specimen Description BLOOD LEFT ANTECUBITAL  Final   Special Requests    Final    BOTTLES DRAWN AEROBIC AND ANAEROBIC Blood Culture results may not be optimal due to an inadequate volume of blood received in culture bottles   Culture   Final    NO GROWTH 3 DAYS Performed at [facility], [address], [place], [place] [address]    Report Status PENDING  Incomplete  MRSA Next Gen by PCR, Nasal     Status: None   Collection Time: 09/23/23 10:41 PM   Specimen: Nasopharyngeal Swab; Nasal Swab  Result Value Ref Range Status   MRSA by PCR Next Gen NOT DETECTED NOT DETECTED Final    Comment: (NOTE) The GeneXpert MRSA Assay (FDA approved for NASAL specimens only), is one component of a comprehensive MRSA colonization surveillance program. It is not intended to diagnose MRSA infection nor to guide or monitor treatment for MRSA infections. Test performance is not FDA approved in patients less than 53 years old. Performed at [facility], [address] [place], [place] [address]          Radiology Studies: ECHOCARDIOGRAM COMPLETE Result Date: 09/25/2023    ECHOCARDIOGRAM REPORT   Patient Name:   YOMARIS PALECEK Date of Exam: 09/25/2023 Medical Rec #:  [id number]       Height:       64.0 in Accession #:    [id number]      Weight:       231.3 lb Date of Birth:  September 30, 1950       BSA:          2.081 m Patient Age:    73 years        BP:  107/70 mmHg Patient Gender: F               HR:           108 bpm. Exam Location:  Inpatient Procedure: 2D Echo, Cardiac Doppler and Color Doppler (Both Spectral and Color            Flow Doppler were utilized during procedure). Indications:    Pericardial effusion I31.1  History:        Patient has prior history of Echocardiogram examinations, most                 recent 03/02/2023. CHF; Risk Factors:Hypertension.                 Hyperlipidemia.  Sonographer:    BERNARDA ROCKS Referring Phys: LONNI LITTIE NANAS IMPRESSIONS  1. Left ventricular ejection fraction, by estimation, is 50 to 55%. The left ventricle has  low normal function. The left ventricle has no regional wall motion abnormalities. Left ventricular diastolic function could not be evaluated.  2. Right ventricular systolic function is normal. The right ventricular size is normal.  3. Left atrial size was mild to moderately dilated.  4. Large pericardial effusion. Moderate pleural effusion in the left lateral region.  5. The mitral valve is normal in structure. No evidence of mitral valve regurgitation.  6. The aortic valve is normal in structure. Aortic valve regurgitation is not visualized.  7. Aortic dilatation noted. There is dilatation of the ascending aorta, measuring 38 mm.  8. The inferior vena cava is normal in size with <50% respiratory variability, suggesting right atrial pressure of 8 mmHg.  9. No overt echocardiographic signs of tamponade. FINDINGS  Left Ventricle: Left ventricular ejection fraction, by estimation, is 50 to 55%. The left ventricle has low normal function. The left ventricle has no regional wall motion abnormalities. The left ventricular internal cavity size was normal in size. There is no left ventricular hypertrophy. Left ventricular diastolic function could not be evaluated due to atrial fibrillation. Left ventricular diastolic function could not be evaluated. Right Ventricle: The right ventricular size is normal. No increase in right ventricular wall thickness. Right ventricular systolic function is normal. Left Atrium: Left atrial size was mild to moderately dilated. Right Atrium: Right atrial size was normal in size. Pericardium: A large pericardial effusion is present. The pericardial effusion appears to contain mixed echogenic material. Mitral Valve: The mitral valve is normal in structure. No evidence of mitral valve regurgitation. MV peak gradient, 5.4 mmHg. The mean mitral valve gradient is 2.0 mmHg. Tricuspid Valve: The tricuspid valve is normal in structure. Tricuspid valve regurgitation is trivial. Aortic Valve: The aortic  valve is normal in structure. There is mild aortic valve annular calcification. Aortic valve regurgitation is not visualized. Aortic valve mean gradient measures 4.0 mmHg. Aortic valve peak gradient measures 8.2 mmHg. Aortic valve area, by VTI measures 2.95 cm. Pulmonic Valve: The pulmonic valve was not well visualized. Pulmonic valve regurgitation is trivial. Aorta: Aortic dilatation noted. There is dilatation of the ascending aorta, measuring 38 mm. Venous: The inferior vena cava is normal in size with less than 50% respiratory variability, suggesting right atrial pressure of 8 mmHg. IAS/Shunts: No atrial level shunt detected by color flow Doppler. Additional Comments: There is a moderate pleural effusion in the left lateral region.  LEFT VENTRICLE PLAX 2D LVIDd:         4.30 cm     Diastology LVIDs:  3.00 cm     LV e' medial:    11.70 cm/s LV PW:         0.80 cm     LV E/e' medial:  10.9 LV IVS:        0.80 cm     LV e' lateral:   11.40 cm/s LVOT diam:     2.00 cm     LV E/e' lateral: 11.2 LV SV:         55 LV SV Index:   26 LVOT Area:     3.14 cm  LV Volumes (MOD) LV vol d, MOD A2C: 94.2 ml LV vol d, MOD A4C: 84.1 ml LV vol s, MOD A2C: 41.4 ml LV vol s, MOD A4C: 35.0 ml LV SV MOD A2C:     52.8 ml LV SV MOD A4C:     84.1 ml LV SV MOD BP:      56.2 ml RIGHT VENTRICLE          IVC RV Basal diam:  3.50 cm  IVC diam: 1.90 cm TAPSE (M-mode): 1.3 cm LEFT ATRIUM             Index        RIGHT ATRIUM           Index LA diam:        3.90 cm 1.87 cm/m   RA Area:     15.30 cm LA Vol (A2C):   86.5 ml 41.57 ml/m  RA Volume:   37.50 ml  18.02 ml/m LA Vol (A4C):   41.4 ml 19.89 ml/m LA Biplane Vol: 61.7 ml 29.65 ml/m  AORTIC VALVE                    PULMONIC VALVE AV Area (Vmax):    2.57 cm     PV Vmax:       0.89 m/s AV Area (Vmean):   2.99 cm     PV Peak grad:  3.2 mmHg AV Area (VTI):     2.95 cm AV Vmax:           143.00 cm/s AV Vmean:          85.500 cm/s AV VTI:            0.185 m AV Peak Grad:      8.2  mmHg AV Mean Grad:      4.0 mmHg LVOT Vmax:         117.00 cm/s LVOT Vmean:        81.500 cm/s LVOT VTI:          0.174 m LVOT/AV VTI ratio: 0.94  AORTA Ao Root diam: 3.20 cm Ao Asc diam:  3.80 cm MITRAL VALVE MV Area (PHT): 4.41 cm     SHUNTS MV Area VTI:   2.73 cm     Systemic VTI:  0.17 m MV Peak grad:  5.4 mmHg     Systemic Diam: 2.00 cm MV Mean grad:  2.0 mmHg MV Vmax:       1.16 m/s MV Vmean:      70.2 cm/s MV Decel Time: 172 msec MV E velocity: 128.00 cm/s MV A velocity: 41.30 cm/s MV E/A ratio:  3.10 Aditya Sabharwal Electronically signed by Ria Commander Signature Date/Time: 09/25/2023/1:58:59 PM    Final          LOS: 3 days   Time spent= 41 mins    Deliliah Room, MD [facility]  If 7PM-7AM,  please contact night-coverage  09/26/2023, 10:36 AM

## 2023-09-26 NOTE — Progress Notes (Signed)
 RN attempted to give pt morning meds crushed in applesauce due the recommendation from SLP but pt took a few bites then refused the rest of the medication.

## 2023-09-26 NOTE — Plan of Care (Signed)

## 2023-09-26 NOTE — Progress Notes (Signed)
 PT Cancellation Note  Patient Details Name: Donna Sexton MRN: [id number] DOB: [birth year]   Cancelled Treatment:    Reason Eval/Treat Not Completed: Patient declined, no reason specified. Pt is awake upon PT arrival however pt adamantly refuses PT intervention, clearly stating  No Physical Therapy multiple times despite PT encouragement. PT will follow up as time allows.   Donna Sexton 09/26/2023, 2:52 PM

## 2023-09-26 NOTE — Progress Notes (Signed)
 Progress Note  Patient Name: Donna Sexton Date of Encounter: 09/26/2023  Primary Cardiologist:   Peter Swaziland, MD   Subjective   She is awake but somnolent.  No acute SOB or pain  Inpatient Medications    Scheduled Meds:  amiodarone   200 mg Oral Daily   apixaban   5 mg Oral BID   atorvastatin   40 mg Oral Daily   budesonide  (PULMICORT ) nebulizer solution  0.25 mg Nebulization BID   diltiazem   120 mg Oral Daily   divalproex   1,500 mg Oral QHS   famotidine   20 mg Oral Daily   feeding supplement  237 mL Oral BID BM   fluticasone  furoate-vilanterol  1 puff Inhalation Daily   guaiFENesin   600 mg Oral BID   metoprolol  tartrate  25 mg Oral BID   mirabegron  ER  25 mg Oral QHS   multivitamin with minerals  1 tablet Oral Daily   polyethylene glycol  17 g Oral Daily   senna-docusate  2 tablet Oral QHS   thiamine   100 mg Oral Daily   Continuous Infusions:  ceFEPime  (MAXIPIME ) IV 2 g (09/26/23 0511)   PRN Meds: acetaminophen  **OR** acetaminophen , bisacodyl , HYDROcodone -acetaminophen , ipratropium-albuterol , metoCLOPramide  (REGLAN ) injection, ondansetron  **OR** ondansetron  (ZOFRAN ) IV   Vital Signs    Vitals:   09/25/23 2115 09/25/23 2300 09/26/23 0300 09/26/23 0815  BP: 109/73 95/63 104/72   Pulse: 100 96 94   Resp:  20 19   Temp:  99.2 F (37.3 C) 99.1 F (37.3 C)   TempSrc:  Axillary Axillary   SpO2:  97% 97% 98%  Weight:        Intake/Output Summary (Last 24 hours) at 09/26/2023 1138 Last data filed at 09/26/2023 0520 Gross per 24 hour  Intake 328.81 ml  Output 550 ml  Net -221.19 ml   Filed Weights   09/25/23 1141  Weight: 106.1 kg    Telemetry     Atrial fib with controlled ventricular rate  - Personally Reviewed  ECG    NA - Personally Reviewed  Physical Exam   GEN: No  acute distress.   Neck: No  JVD Cardiac: Irregular RR, no murmurs, rubs, or gallops.  Respiratory:     Decreased breath sounds left greater than right base GI: Soft,  nontender, non-distended, normal bowel sounds  MS:  No edema; No deformity. Neuro:   Nonfocal    Labs    Chemistry Recent Labs  Lab 09/23/23 1516 09/24/23 0103 09/24/23 0252 09/26/23 0533  NA 137 136 137 134*  K 3.7 4.1 4.1 3.9  CL 99  --  99 101  CO2 28  --  22 25  GLUCOSE 165*  --  128* 96  BUN 13  --  16 16  CREATININE 0.77  --  0.81 0.67  CALCIUM  9.2  --  9.3 8.9  PROT 5.9*  --  6.0*  --   ALBUMIN 3.2*  --  3.1*  --   AST 15  --  17  --   ALT 11  --  10  --   ALKPHOS 52  --  51  --   BILITOT 1.2  --  1.7*  --   GFRNONAA >60  --  >60 >60  ANIONGAP 10  --  16* 8     Hematology Recent Labs  Lab 09/24/23 0252 09/25/23 1306 09/26/23 0533  WBC 17.7* 12.9* 9.1  RBC 4.00 3.01* 2.89*  HGB 13.5 10.2* 9.6*  HCT 41.3 30.6* 29.5*  MCV 103.3* 101.7* 102.1*  MCH 33.8 33.9 33.2  MCHC 32.7 33.3 32.5  RDW 13.7 13.6 13.4  PLT 338 185 185    Cardiac EnzymesNo results for input(s): TROPONINI in the last 168 hours. No results for input(s): TROPIPOC in the last 168 hours.   BNP Recent Labs  Lab 09/23/23 1516  BNP 227.9*     DDimer No results for input(s): DDIMER in the last 168 hours.   Radiology    ECHOCARDIOGRAM COMPLETE Result Date: 09/25/2023    ECHOCARDIOGRAM REPORT   Patient Name:   Donna Sexton Date of Exam: 09/25/2023 Medical Rec #:  [id number]       Height:       64.0 in Accession #:    [id number]      Weight:       231.3 lb Date of Birth:  Jan 12, 1950       BSA:          2.081 m Patient Age:    73 years        BP:           107/70 mmHg Patient Gender: F               HR:           108 bpm. Exam Location:  Inpatient Procedure: 2D Echo, Cardiac Doppler and Color Doppler (Both Spectral and Color            Flow Doppler were utilized during procedure). Indications:    Pericardial effusion I31.1  History:        Patient has prior history of Echocardiogram examinations, most                 recent 03/02/2023. CHF; Risk Factors:Hypertension.                  Hyperlipidemia.  Sonographer:    BERNARDA ROCKS Referring Phys: LONNI LITTIE NANAS IMPRESSIONS  1. Left ventricular ejection fraction, by estimation, is 50 to 55%. The left ventricle has low normal function. The left ventricle has no regional wall motion abnormalities. Left ventricular diastolic function could not be evaluated.  2. Right ventricular systolic function is normal. The right ventricular size is normal.  3. Left atrial size was mild to moderately dilated.  4. Large pericardial effusion. Moderate pleural effusion in the left lateral region.  5. The mitral valve is normal in structure. No evidence of mitral valve regurgitation.  6. The aortic valve is normal in structure. Aortic valve regurgitation is not visualized.  7. Aortic dilatation noted. There is dilatation of the ascending aorta, measuring 38 mm.  8. The inferior vena cava is normal in size with <50% respiratory variability, suggesting right atrial pressure of 8 mmHg.  9. No overt echocardiographic signs of tamponade. FINDINGS  Left Ventricle: Left ventricular ejection fraction, by estimation, is 50 to 55%. The left ventricle has low normal function. The left ventricle has no regional wall motion abnormalities. The left ventricular internal cavity size was normal in size. There is no left ventricular hypertrophy. Left ventricular diastolic function could not be evaluated due to atrial fibrillation. Left ventricular diastolic function could not be evaluated. Right Ventricle: The right ventricular size is normal. No increase in right ventricular wall thickness. Right ventricular systolic function is normal. Left Atrium: Left atrial size was mild to moderately dilated. Right Atrium: Right atrial size was normal in size. Pericardium: A large pericardial effusion is present. The pericardial effusion appears to contain mixed  echogenic material. Mitral Valve: The mitral valve is normal in structure. No evidence of mitral valve regurgitation. MV peak  gradient, 5.4 mmHg. The mean mitral valve gradient is 2.0 mmHg. Tricuspid Valve: The tricuspid valve is normal in structure. Tricuspid valve regurgitation is trivial. Aortic Valve: The aortic valve is normal in structure. There is mild aortic valve annular calcification. Aortic valve regurgitation is not visualized. Aortic valve mean gradient measures 4.0 mmHg. Aortic valve peak gradient measures 8.2 mmHg. Aortic valve area, by VTI measures 2.95 cm. Pulmonic Valve: The pulmonic valve was not well visualized. Pulmonic valve regurgitation is trivial. Aorta: Aortic dilatation noted. There is dilatation of the ascending aorta, measuring 38 mm. Venous: The inferior vena cava is normal in size with less than 50% respiratory variability, suggesting right atrial pressure of 8 mmHg. IAS/Shunts: No atrial level shunt detected by color flow Doppler. Additional Comments: There is a moderate pleural effusion in the left lateral region.  LEFT VENTRICLE PLAX 2D LVIDd:         4.30 cm     Diastology LVIDs:         3.00 cm     LV e' medial:    11.70 cm/s LV PW:         0.80 cm     LV E/e' medial:  10.9 LV IVS:        0.80 cm     LV e' lateral:   11.40 cm/s LVOT diam:     2.00 cm     LV E/e' lateral: 11.2 LV SV:         55 LV SV Index:   26 LVOT Area:     3.14 cm  LV Volumes (MOD) LV vol d, MOD A2C: 94.2 ml LV vol d, MOD A4C: 84.1 ml LV vol s, MOD A2C: 41.4 ml LV vol s, MOD A4C: 35.0 ml LV SV MOD A2C:     52.8 ml LV SV MOD A4C:     84.1 ml LV SV MOD BP:      56.2 ml RIGHT VENTRICLE          IVC RV Basal diam:  3.50 cm  IVC diam: 1.90 cm TAPSE (M-mode): 1.3 cm LEFT ATRIUM             Index        RIGHT ATRIUM           Index LA diam:        3.90 cm 1.87 cm/m   RA Area:     15.30 cm LA Vol (A2C):   86.5 ml 41.57 ml/m  RA Volume:   37.50 ml  18.02 ml/m LA Vol (A4C):   41.4 ml 19.89 ml/m LA Biplane Vol: 61.7 ml 29.65 ml/m  AORTIC VALVE                    PULMONIC VALVE AV Area (Vmax):    2.57 cm     PV Vmax:       0.89 m/s AV  Area (Vmean):   2.99 cm     PV Peak grad:  3.2 mmHg AV Area (VTI):     2.95 cm AV Vmax:           143.00 cm/s AV Vmean:          85.500 cm/s AV VTI:            0.185 m AV Peak Grad:      8.2 mmHg AV Mean  Grad:      4.0 mmHg LVOT Vmax:         117.00 cm/s LVOT Vmean:        81.500 cm/s LVOT VTI:          0.174 m LVOT/AV VTI ratio: 0.94  AORTA Ao Root diam: 3.20 cm Ao Asc diam:  3.80 cm MITRAL VALVE MV Area (PHT): 4.41 cm     SHUNTS MV Area VTI:   2.73 cm     Systemic VTI:  0.17 m MV Peak grad:  5.4 mmHg     Systemic Diam: 2.00 cm MV Mean grad:  2.0 mmHg MV Vmax:       1.16 m/s MV Vmean:      70.2 cm/s MV Decel Time: 172 msec MV E velocity: 128.00 cm/s MV A velocity: 41.30 cm/s MV E/A ratio:  3.10 Aditya Sabharwal Electronically signed by Ria Commander Signature Date/Time: 09/25/2023/1:58:59 PM    Final     Cardiac Studies   Echo: See above  Patient Profile     73 y.o. female with medical history significant of bipolar disorder, hypertension, OSA, paroxysmal atrial fibrillation, chronic diastolic heart failure who presented with acute metabolic encephalopathy we are consulted for atrial fibrillation.  She presented with nausea/vomiting and confusion.  Noted to be in A-fib with heart rates in the 100s.   Assessment & Plan    PAF:  History of PAF now persistent with acute illness.  Rates controlled.  Continue amio and Dilt.     Pericardial effusion:  Pericardial effusion was read as large on the official echo. I have reviewed these images again and will review with colleagues.  I don't suspect hemorrhagic effusion.  I suggest this is sympathetic effusion related to pneumonia.  I will plan to follow this as an outpatient.  She is hypertensive and there is no evidence on echo of tamponade physiology.    WBC are trending down.  No plan for intervention.    For questions or updates, please contact [facility] Please consult [email or web address] for contact info under Cardiology/STEMI.   Signed, Lynwood Schilling, MD  09/26/2023, 11:38 AM

## 2023-09-26 NOTE — Care Management Important Message (Signed)
 Important Message  Patient Details  Name: Donna Sexton MRN: [id number] Date of Birth: 1950/05/25   Important Message Given:  Yes - Medicare IM     Jon Cruel 09/26/2023, 11:53 AM

## 2023-09-27 DIAGNOSIS — I3139 Other pericardial effusion (noninflammatory): Secondary | ICD-10-CM | POA: Diagnosis not present

## 2023-09-27 DIAGNOSIS — I480 Paroxysmal atrial fibrillation: Secondary | ICD-10-CM | POA: Diagnosis not present

## 2023-09-27 DIAGNOSIS — J9601 Acute respiratory failure with hypoxia: Secondary | ICD-10-CM | POA: Diagnosis not present

## 2023-09-27 LAB — CBC WITH DIFFERENTIAL/PLATELET
Abs Immature Granulocytes: 0.04 K/uL (ref 0.00–0.07)
Basophils Absolute: 0.1 K/uL (ref 0.0–0.1)
Basophils Relative: 1 %
Eosinophils Absolute: 0.1 K/uL (ref 0.0–0.5)
Eosinophils Relative: 1 %
HCT: 29.7 % — ABNORMAL LOW (ref 36.0–46.0)
Hemoglobin: 9.6 g/dL — ABNORMAL LOW (ref 12.0–15.0)
Immature Granulocytes: 1 %
Lymphocytes Relative: 11 %
Lymphs Abs: 0.7 K/uL (ref 0.7–4.0)
MCH: 33.6 pg (ref 26.0–34.0)
MCHC: 32.3 g/dL (ref 30.0–36.0)
MCV: 103.8 fL — ABNORMAL HIGH (ref 80.0–100.0)
Monocytes Absolute: 1 K/uL (ref 0.1–1.0)
Monocytes Relative: 15 %
Neutro Abs: 4.8 K/uL (ref 1.7–7.7)
Neutrophils Relative %: 71 %
Platelets: 146 K/uL — ABNORMAL LOW (ref 150–400)
RBC: 2.86 MIL/uL — ABNORMAL LOW (ref 3.87–5.11)
RDW: 13.2 % (ref 11.5–15.5)
WBC: 6.8 K/uL (ref 4.0–10.5)
nRBC: 0 % (ref 0.0–0.2)

## 2023-09-27 LAB — BASIC METABOLIC PANEL WITH GFR
Anion gap: 16 — ABNORMAL HIGH (ref 5–15)
BUN: 15 mg/dL (ref 8–23)
CO2: 20 mmol/L — ABNORMAL LOW (ref 22–32)
Calcium: 9 mg/dL (ref 8.9–10.3)
Chloride: 98 mmol/L (ref 98–111)
Creatinine, Ser: 0.66 mg/dL (ref 0.44–1.00)
GFR, Estimated: >60 mL/min (ref >60)
Glucose, Bld: 88 mg/dL (ref 70–99)
Potassium: 4 mmol/L (ref 3.5–5.1)
Sodium: 134 mmol/L — ABNORMAL LOW (ref 135–145)

## 2023-09-27 MED ORDER — DILTIAZEM HCL ER COATED BEADS 180 MG PO CP24
180.0000 mg | ORAL_CAPSULE | Freq: Every day | ORAL | Status: DC
Start: 2023-09-28 — End: 2023-10-04
  Administered 2023-09-28 – 2023-10-03 (×6): 180 mg via ORAL
  Filled 2023-09-27 (×7): qty 1

## 2023-09-27 MED ORDER — FUROSEMIDE 10 MG/ML IJ SOLN
20.0000 mg | Freq: Once | INTRAMUSCULAR | Status: AC
Start: 2023-09-27 — End: 2023-09-27
  Administered 2023-09-27: 20 mg via INTRAVENOUS
  Filled 2023-09-27: qty 2

## 2023-09-27 NOTE — Progress Notes (Signed)
 Progress Note  Patient Name: Donna Sexton Date of Encounter: 09/27/2023  Primary Cardiologist:   Peter Swaziland, MD   Subjective   She is arousable and answers questions.  No acute pain or SOB.   Inpatient Medications    Scheduled Meds:  amiodarone   200 mg Oral Daily   apixaban   5 mg Oral BID   atorvastatin   40 mg Oral Daily   budesonide  (PULMICORT ) nebulizer solution  0.25 mg Nebulization BID   diltiazem   120 mg Oral Daily   divalproex   1,500 mg Oral QHS   famotidine   20 mg Oral Daily   feeding supplement  237 mL Oral BID BM   fluticasone  furoate-vilanterol  1 puff Inhalation Daily   guaiFENesin   600 mg Oral BID   metoprolol  tartrate  25 mg Oral BID   mirabegron  ER  25 mg Oral QHS   multivitamin with minerals  1 tablet Oral Daily   polyethylene glycol  17 g Oral Daily   senna-docusate  2 tablet Oral QHS   thiamine   100 mg Oral Daily   Continuous Infusions:  sodium chloride  Stopped (09/26/23 1243)   ceFEPime  (MAXIPIME ) IV 2 g (09/27/23 0339)   PRN Meds: acetaminophen  **OR** acetaminophen , bisacodyl , HYDROcodone -acetaminophen , ipratropium-albuterol , metoCLOPramide  (REGLAN ) injection, ondansetron  **OR** ondansetron  (ZOFRAN ) IV   Vital Signs    Vitals:   09/26/23 2310 09/27/23 0324 09/27/23 0905 09/27/23 0907  BP: (!) 149/97 (!) 148/97  (!) 135/96  Pulse: (!) 108 (!) 102    Resp: 20 20    Temp: 97.9 F (36.6 C) 98.5 F (36.9 C) 97.7 F (36.5 C)   TempSrc: Oral Axillary Oral   SpO2: 92% 91%    Weight:        Intake/Output Summary (Last 24 hours) at 09/27/2023 1056 Last data filed at 09/27/2023 0502 Gross per 24 hour  Intake 175.29 ml  Output 710 ml  Net -534.71 ml   Filed Weights   09/25/23 1141  Weight: 106.1 kg    Telemetry    Atrial fib with rate slightly increased  - Personally Reviewed  ECG    NA - Personally Reviewed  Physical Exam   GEN: No  acute distress.   Neck: No  JVD Cardiac: Irregular RR, no murmurs, rubs, or gallops.   Respiratory:    Decreased breath sounds 1/3 up both lungs GI: Soft, nontender, non-distended, normal bowel sounds  MS:  NO edema; No deformity. Neuro:   Nonfocal      Labs    Chemistry Recent Labs  Lab 09/23/23 1516 09/24/23 0103 09/24/23 0252 09/26/23 0533 09/27/23 0609  NA 137   < > 137 134* 134*  K 3.7   < > 4.1 3.9 4.0  CL 99  --  99 101 98  CO2 28  --  22 25 20*  GLUCOSE 165*  --  128* 96 88  BUN 13  --  16 16 15   CREATININE 0.77  --  0.81 0.67 0.66  CALCIUM  9.2  --  9.3 8.9 9.0  PROT 5.9*  --  6.0*  --   --   ALBUMIN 3.2*  --  3.1*  --   --   AST 15  --  17  --   --   ALT 11  --  10  --   --   ALKPHOS 52  --  51  --   --   BILITOT 1.2  --  1.7*  --   --  GFRNONAA >60  --  >60 >60 >60  ANIONGAP 10  --  16* 8 16*   < > = values in this interval not displayed.     Hematology Recent Labs  Lab 09/25/23 1306 09/26/23 0533 09/27/23 0609  WBC 12.9* 9.1 6.8  RBC 3.01* 2.89* 2.86*  HGB 10.2* 9.6* 9.6*  HCT 30.6* 29.5* 29.7*  MCV 101.7* 102.1* 103.8*  MCH 33.9 33.2 33.6  MCHC 33.3 32.5 32.3  RDW 13.6 13.4 13.2  PLT 185 185 146*    Cardiac EnzymesNo results for input(s): TROPONINI in the last 168 hours. No results for input(s): TROPIPOC in the last 168 hours.   BNP Recent Labs  Lab 09/23/23 1516  BNP 227.9*     DDimer No results for input(s): DDIMER in the last 168 hours.   Radiology    CT HEAD WO CONTRAST ( ) Result Date: 09/26/2023 CLINICAL DATA:  Mental status change, unknown cause. Persistent AMS. EXAM: CT HEAD WITHOUT CONTRAST TECHNIQUE: Contiguous axial images were obtained from the base of the skull through the vertex without intravenous contrast. RADIATION DOSE REDUCTION: This exam was performed according to the departmental dose-optimization program which includes automated exposure control, adjustment of the mA and/or kV according to patient size and/or use of iterative reconstruction technique. COMPARISON:  Head CT 09/23/2023  FINDINGS: Brain: There is no evidence of an acute infarct, intracranial hemorrhage, mass, midline shift, or extra-axial fluid collection. There is mild cerebral atrophy. Cerebral white matter hypodensities are nonspecific but compatible with mild chronic small vessel ischemic disease. Vascular: No hyperdense vessel. Skull: No fracture or suspicious lesion. Sinuses/Orbits: Mild mucosal thickening in the ethmoid sinuses. Clear mastoid air cells. Bilateral cataract extraction. Other: None. IMPRESSION: 1. No evidence of acute intracranial abnormality. 2. Mild chronic small vessel ischemic disease. Electronically Signed   By: Dasie Hamburg M.D.   On: 09/26/2023 18:42    Cardiac Studies   Echo: See above  Patient Profile     73 y.o. female with medical history significant of bipolar disorder, hypertension, OSA, paroxysmal atrial fibrillation, chronic diastolic heart failure who presented with acute metabolic encephalopathy we are consulted for atrial fibrillation.  She presented with nausea/vomiting and confusion.  Noted to be in A-fib with heart rates in the 100s.   Assessment & Plan    PAF:  History of PAF now persistent with acute illness.  Rates controlled.  Continue amio .  I will increase the Diltiazem  to 180 mg PO daily.    Pericardial effusion:  Pericardial effusion was read as large on the official echo. I have reviewed these images again.  I will follow up with repeat echo in about 4 weeks unless she has hypotension or worsening oxygenation.   Leukocytosis is improved.  No fever.  I am not suspecting pericarditis, hemorrhagic effusion.  Suspected pneumonia treated.  She has continued decreased breath sounds that sound worse and poor pulmonary toilet .  I will give a dose of IV Lasix  today.     For questions or updates, please contact [facility] Please consult [email or web address] for contact info under Cardiology/STEMI.   Signed, Lynwood Schilling, MD  09/27/2023, 10:56 AM

## 2023-09-27 NOTE — Progress Notes (Signed)
 Nutrition Follow-up  DOCUMENTATION CODES:   Not applicable  INTERVENTION:  Encourage PO intake  Recommend SLP to evaluate to see if diet can be advanced  Ensure Plus High Protein po BID, each supplement provides 350 kcal and 20 grams of protein Mighty Shake TID with meals, each supplement provides 330 kcals and 9 grams of protein MVI with minerals daily 100 mg Thiamine  daily  May benefit from appetite stimulant  NUTRITION DIAGNOSIS:   Inadequate oral intake related to acute illness as evidenced by meal completion < 25%. - Ongoing   GOAL:   Patient will meet greater than or equal to 90% of their needs - Ongoing   MONITOR:   PO intake, Supplement acceptance, Diet advancement, Labs  REASON FOR ASSESSMENT:   Consult Assessment of nutrition requirement/status  ASSESSMENT:   73 y.o. female with PMH of bipolar disorder, anemia and anxiety, asthma GERD, IBS, HL,D HTN, osteopenia, overactive bladder, sleep apnea, prediabetes, recurrent UTIs ,PAF, ,chronic diastolic CHF, Presented with nausea, vomiting and altered confusion. Being treated for PNA. Recent admit with UTI and sepsis.   Pt resting in bed with ex-husband at bedside. Did not have breakfast today, lunch tray just delivered however, pt reports she is not hungry. Ex-husband reports she only had jello yesterday and sips of coke and Ensure. Per nursing pt is not eating. Pt on at least day 5 of poor intake may need cortrak placed if within GOC. Last meal reported by ex-husband was Friday 9/12. Reached out to MD about palliative care consult. Pt may do better if diet advanced from FLD. Suspect fluid may be playing a role in decreased appetite as well on lasix . Pt denies pain when eating, nausea, or vomiting.   Admit weight: 106.1 kg - Fluid? Current weight: 106.1 kg  +1 Edema RLE, LUE, LLE, RLE  Average Meal Intake: 9/15: 25% intake x 1 recorded meals  Nutritionally Relevant Medications: Scheduled Meds:  famotidine   20 mg  Oral Daily   feeding supplement  237 mL Oral BID BM   mirabegron  ER  25 mg Oral QHS   multivitamin with minerals  1 tablet Oral Daily   senna-docusate  2 tablet Oral QHS   thiamine   100 mg Oral Daily   Continuous Infusions:  sodium chloride  Stopped (09/26/23 1243)   ceFEPime  (MAXIPIME ) IV 2 g (09/27/23 1125)   Labs Reviewed: Sodium 134 Phosphorus 4.9 Ammonia 6.0 CBG ranges from 88-96 mg/dL over the last 24 hours HgbA1c 6.2  Diet Order:   Diet Order             Diet full liquid Room service appropriate? No; Fluid consistency: Thin  Diet effective now                   EDUCATION NEEDS:   Not appropriate for education at this time  Skin:  Skin Assessment: Reviewed RN Assessment  Last BM:  9/16, type 6  Height:   Ht Readings from Last 1 Encounters:  09/05/23 5' 4 (1.626 m)    Weight:   Wt Readings from Last 1 Encounters:  09/25/23 106.1 kg    Ideal Body Weight:  54.5 kg  BMI:  Body mass index is 40.15 kg/m.  Estimated Nutritional Needs:   Kcal:  1900-2100 kcal  Protein:  110-130 gm  Fluid:  >1.9L/day   Olivia Kenning, RD Registered Dietitian  See Amion for more information

## 2023-09-27 NOTE — Progress Notes (Signed)
 [facility] Progress Note Patient: Donna Sexton FMW:[id number] DOB: May 29, 1950  DOA: 09/23/2023 DOS: the patient was seen and examined on 09/27/2023  Brief Hospital Course: 73 y.o. female with medical history significant of bipolar disorder anemia and anxiety asthma GERD, IBS, HL,D HTN, osteopenia overactive bladder, sleep apnea, prediabetes, recurrent UTIs ,paroxysmal atrial fibrillation ,chronic diastolic CHF, Presented with nausea, vomiting and altered mentation. She is being treated for a possible PNA.  Assessment & Plan: Acute metabolic encephalopathy, POA:  Most likely multifactorial secondary to dehydration, polypharmacy and possible PNA. CT head done in ED didn't show any acute abnormalities Continue to hold psychotropic medications. Repeat CT unremarkable. No focal deficit. Mentation improving.  Acute respiratory failure with hypoxia,POA:  pneumonia. Chest x-ray showing left pleural effusion and left basilar atelectasis right mild right basilar atelectasis and low lung volumes Patient is currently on anticoagulation, continue Eliquis  CT showed Consolidation/atelectasis in the lung bases left greater than right as before. Leukocytosis has resolved now. Treating for possible underlying pneumonia SLP saw the patient.  Recommended regular diet.   Atrial fibrillation with rapid ventricular response, POA: rate controlled now Continue diltiazem  p.o. with holding parameters  continue amiodarone  200 mg daily  continue Eliquis  5 mg p.o. twice daily as per cardiology recommendations Cardiology on board F/u ECHO-EF 50-55%   Bipolar disorder  Continue home medications   Chronic heart failure with preserved ejection fraction Currently, she appears to be somewhat euvolemic   Class 3 obesity Body mass index is 40.15 kg/m.  Placing the patient at high risk for: Possibility of obesity hypoventilation   Constipation Bowel regimen ordered   Essential hypertension Prn  antihypertensives   Hyperlipidemia Continue Lipitor 40 mg daily   Poor oral intake,POA:  With improvement in mentation anticipate improvement in oral intake.   CAP (community acquired pneumonia),possible bacterial Continue with IV Cefepime . Dced Vanc as MRSA Nares is negative.   Pericardial effusion Appreciate cardiology consult ECHO showed large pericardial effusion but no tamponade.   Subjective: No nausea no vomiting no fever no chills.  Physical Exam: S1-S2 present Trace edema.  Reports tenderness. AAO x3, able to follow up commands.   Data Reviewed: I have Reviewed nursing notes, Vitals, and Lab results. Since last encounter, pertinent lab results CBC and BMP   . I have ordered test including CBC BMP doppler  .   Disposition: Status is: Inpatient Remains inpatient appropriate because: Monitor for hemodynamic improvement  SCDs Start: 09/24/23 0152 apixaban  (ELIQUIS ) tablet 5 mg    Family Communication: No 1 at bedside  Level of care: Progressive   Vitals:   09/27/23 0324 09/27/23 0905 09/27/23 0907 09/27/23 1149  BP: (!) 148/97  (!) 135/96 107/78  Pulse: (!) 102     Resp: 20   (!) 23  Temp: 98.5 F (36.9 C) 97.7 F (36.5 C)  98.1 F (36.7 C)  TempSrc: Axillary Oral  Oral  SpO2: 91%     Weight:         Author: Yetta Blanch, MD 09/27/2023 6:50 PM  Please look on [email or web address] to find out who is on call.

## 2023-09-27 NOTE — Progress Notes (Signed)
 Physical Therapy Treatment Patient Details Name: Donna Sexton MRN: [id number] DOB: 12-22-1950 Today's Date: 09/27/2023   History of Present Illness Pt is a 73 y.o. female presenting 9/13 with chest pain, confusion, shortness of breath, N/V. Admitted for acute respiratory failure with hypoxia, metabolic encephalopathy. CT reveals Left pleural effusion and left basilar atelectasis.  Recent hospitalization 8/26-9/2 for UTI/sepsis.  PMH: bipolar disorder, anemia, anxiety, asthma GERD, IBS, HLD, HTN, osteopenia, overactive bladder, sleep apnea, prediabetes, recurrent UTIs, paroxysmal atrial fibrillation, chronic diastolic CHF    PT Comments  Pt with improved arousal this session but continues to require significant physical assistance to mobilize. Pt remains confused, is slow to respond, and often maintains eyes closed during session. Pt will benefit from continued PT services in an effort to improve activity tolerance and reduce falls risk. Patient will benefit from continued inpatient follow up therapy, <3 hours/day.    If plan is discharge home, recommend the following: Two people to help with walking and/or transfers;Two people to help with bathing/dressing/bathroom;Assistance with cooking/housework;Assistance with feeding;Direct supervision/assist for medications management;Direct supervision/assist for financial management;Assist for transportation;Help with stairs or ramp for entrance;Supervision due to cognitive status   Can travel by private vehicle     No  Equipment Recommendations  Hospital bed;Hoyer lift    Recommendations for Other Services       Precautions / Restrictions Precautions Precautions: Fall Recall of Precautions/Restrictions: Impaired Restrictions Weight Bearing Restrictions Per Provider Order: No     Mobility  Bed Mobility Overal bed mobility: Needs Assistance Bed Mobility: Supine to Sit     Supine to sit: Total assist, HOB elevated           Transfers Overall transfer level: Needs assistance Equipment used: 1 person hand held assist Transfers: Sit to/from Stand, Bed to chair/wheelchair/BSC Sit to Stand: Max assist Stand pivot transfers: Total assist         General transfer comment: PT assists pt in standing 3 times with one SPT to recliner. Pt requires heavy physical assistance to ascend into standing and totalA to pivot    Ambulation/Gait                   Stairs             Wheelchair Mobility     Tilt Bed    Modified Rankin (Stroke Patients Only)       Balance Overall balance assessment: Needs assistance Sitting-balance support: No upper extremity supported, Feet supported Sitting balance-Leahy Scale: Fair     Standing balance support: Bilateral upper extremity supported, Reliant on assistive device for balance Standing balance-Leahy Scale: Zero Standing balance comment: max-totalA                            Communication Communication Communication: Impaired Factors Affecting Communication: Difficulty expressing self  Cognition Arousal: Alert Behavior During Therapy: Flat affect   PT - Cognitive impairments: Orientation, Awareness, Memory, Attention, Initiation, Sequencing, Problem solving, Safety/Judgement   Orientation impairments: Time                     Following commands: Impaired Following commands impaired: Follows one step commands inconsistently, Follows one step commands with increased time    Cueing Cueing Techniques: Verbal cues, Tactile cues  Exercises      General Comments General comments (skin integrity, edema, etc.): pt on 2L [place], VSS      Pertinent Vitals/Pain Pain Assessment Pain Assessment: Faces  Faces Pain Scale: Hurts little more Pain Location: generalized Pain Descriptors / Indicators: Grimacing Pain Intervention(s): Monitored during session    Home Living                          Prior Function             PT Goals (current goals can now be found in the care plan section) Acute Rehab PT Goals Patient Stated Goal: to get better Progress towards PT goals: Progressing toward goals    Frequency    Min 2X/week      PT Plan      Co-evaluation              AM-PAC PT 6 Clicks Mobility   Outcome Measure  Help needed turning from your back to your side while in a flat bed without using bedrails?: A Lot Help needed moving from lying on your back to sitting on the side of a flat bed without using bedrails?: A Lot Help needed moving to and from a bed to a chair (including a wheelchair)?: Total Help needed standing up from a chair using your arms (e.g., wheelchair or bedside chair)?: Total Help needed to walk in hospital room?: Total Help needed climbing 3-5 steps with a railing? : Total 6 Click Score: 8    End of Session Equipment Utilized During Treatment: Oxygen Activity Tolerance: Patient tolerated treatment well Patient left: in chair;with call bell/phone within reach;with chair alarm set Nurse Communication: Mobility status;Need for lift equipment PT Visit Diagnosis: Unsteadiness on feet (R26.81);Muscle weakness (generalized) (M62.81);Difficulty in walking, not elsewhere classified (R26.2)     Time: 1120-1150 PT Time Calculation (min) (ACUTE ONLY): 30 min  Charges:    $Therapeutic Activity: 23-37 mins PT General Charges $$ ACUTE PT VISIT: 1 Visit                     Bernardino JINNY Ruth, PT, DPT Acute Rehabilitation Office [phone]    Bernardino JINNY Ruth 09/27/2023, 12:27 PM

## 2023-09-27 NOTE — Hospital Course (Addendum)
 73 y.o. female with medical history significant of bipolar disorder anemia and anxiety asthma GERD, IBS, HL,D HTN, osteopenia overactive bladder, sleep apnea, prediabetes, recurrent UTIs ,paroxysmal atrial fibrillation ,chronic diastolic CHF, Presented with nausea, vomiting and altered mentation. She is being treated for a possible PNA.  Assessment & Plan: Acute metabolic encephalopathy, POA:  Most likely multifactorial secondary to dehydration, polypharmacy and possible PNA. CT head done in ED didn't show any acute abnormalities Continue to hold psychotropic medications. Repeat CT unremarkable. No focal deficit. Mentation improving.  Acute respiratory failure with hypoxia,POA:  pneumonia. Chest x-ray showing left pleural effusion and left basilar atelectasis right mild right basilar atelectasis and low lung volumes Patient is currently on anticoagulation, continue Eliquis  CT showed Consolidation/atelectasis in the lung bases left greater than right as before. Leukocytosis has resolved now. Treated for possible underlying pneumonia SLP saw the patient.  Recommended regular diet.   Atrial fibrillation with rapid ventricular response, POA: rate controlled now Continue diltiazem  p.o. with holding parameters  continue amiodarone  200 mg daily  continue Eliquis  5 mg p.o. twice daily as per cardiology recommendations Cardiology on board F/u ECHO-EF 50-55%   Bipolar disorder  Continue home medications Psychiatry consulted as patient continues with poor p.o. intake concern for possible ongoing bipolar disorder with depression issues.   Chronic heart failure with preserved ejection fraction Currently, she appears to be somewhat euvolemic   Class 3 obesity Body mass index is 40.15 kg/m.  Placing the patient at high risk for: Possibility of obesity hypoventilation   Constipation Bowel regimen ordered   Essential hypertension Prn antihypertensives   Hyperlipidemia Continue Lipitor  40 mg daily   Poor oral intake,POA:  Continues to have minimal oral intake. Marinol  was recommended although due to backorder unable to provide that medication here in the hospital. Given that the patient is already on Eliquis  Megace  will be attempted and will monitor. Palliative care consulted.  Patient has been on Megace .   CAP (community acquired pneumonia),possible bacterial Treated with IV Cefepime  MRSA PCR negative.   Pericardial effusion Appreciate cardiology consult ECHO showed large pericardial effusion but no tamponade. Cardiology recommending repeat echocardiogram next week whether inpatient or outpatient.

## 2023-09-27 NOTE — Plan of Care (Signed)

## 2023-09-28 DIAGNOSIS — J9601 Acute respiratory failure with hypoxia: Secondary | ICD-10-CM | POA: Diagnosis not present

## 2023-09-28 LAB — CBC
HCT: 29.7 % — ABNORMAL LOW (ref 36.0–46.0)
Hemoglobin: 9.9 g/dL — ABNORMAL LOW (ref 12.0–15.0)
MCH: 33.3 pg (ref 26.0–34.0)
MCHC: 33.3 g/dL (ref 30.0–36.0)
MCV: 100 fL (ref 80.0–100.0)
Platelets: 151 K/uL (ref 150–400)
RBC: 2.97 MIL/uL — ABNORMAL LOW (ref 3.87–5.11)
RDW: 13.1 % (ref 11.5–15.5)
WBC: 6.2 K/uL (ref 4.0–10.5)
nRBC: 0 % (ref 0.0–0.2)

## 2023-09-28 LAB — BASIC METABOLIC PANEL WITH GFR
Anion gap: 12 (ref 5–15)
BUN: 13 mg/dL (ref 8–23)
CO2: 25 mmol/L (ref 22–32)
Calcium: 9 mg/dL (ref 8.9–10.3)
Chloride: 97 mmol/L — ABNORMAL LOW (ref 98–111)
Creatinine, Ser: 0.5 mg/dL (ref 0.44–1.00)
GFR, Estimated: >60 mL/min (ref >60)
Glucose, Bld: 90 mg/dL (ref 70–99)
Potassium: 3.5 mmol/L (ref 3.5–5.1)
Sodium: 134 mmol/L — ABNORMAL LOW (ref 135–145)

## 2023-09-28 LAB — MAGNESIUM: Magnesium: 1.8 mg/dL (ref 1.7–2.4)

## 2023-09-28 LAB — CULTURE, BLOOD (ROUTINE X 2)
Culture: NO GROWTH
Culture: NO GROWTH

## 2023-09-28 MED ORDER — BOOST PLUS PO LIQD
237.0000 mL | Freq: Three times a day (TID) | ORAL | Status: DC
  Administered 2023-09-28 – 2023-09-29 (×3): 237 mL via ORAL
  Filled 2023-09-28 (×5): qty 237

## 2023-09-28 MED ORDER — FUROSEMIDE 40 MG PO TABS
40.0000 mg | ORAL_TABLET | Freq: Every day | ORAL | Status: DC
  Administered 2023-09-28 – 2023-10-03 (×6): 40 mg via ORAL
  Filled 2023-09-28 (×7): qty 1

## 2023-09-28 MED ORDER — ACETAMINOPHEN 500 MG PO TABS
1000.0000 mg | ORAL_TABLET | Freq: Once | ORAL | Status: AC
  Administered 2023-09-28: 1000 mg via ORAL
  Filled 2023-09-28: qty 2

## 2023-09-28 NOTE — Plan of Care (Signed)

## 2023-09-28 NOTE — Progress Notes (Signed)
 Occupational Therapy Treatment Patient Details Name: Donna Sexton MRN: [id number] DOB: 12-26-1950 Today's Date: 09/28/2023   History of present illness Pt is a 73 y.o. female presenting 9/13 with chest pain, confusion, shortness of breath, N/V. Admitted for acute respiratory failure with hypoxia, metabolic encephalopathy. CT reveals Left pleural effusion and left basilar atelectasis.  Recent hospitalization 8/26-9/2 for UTI/sepsis.  PMH: bipolar disorder, anemia, anxiety, asthma GERD, IBS, HLD, HTN, osteopenia, overactive bladder, sleep apnea, prediabetes, recurrent UTIs, paroxysmal atrial fibrillation, chronic diastolic CHF   OT comments  Limited treatment this session, patient preoccupied regarding a perceived care plan meeting regarding home, that was supposed to happen at 10:30am.  MD in room and not aware of such meeting, OT discussed short term rehab at SNF prior to returning home.  Patient initially willing to sit EOB and try stand pivot to recliner, but became increasingly preoccupied regarding discharging home.  Patient declined OOB, and requesting OT contact her son so she could talk with him.  Patient returned to bed, Max A overall, and phone handed to patient.  OT will continue efforts in the acute setting and Patient will benefit from continued inpatient follow up therapy, <3 hours/day.      If plan is discharge home, recommend the following:  Two people to help with walking and/or transfers;Two people to help with bathing/dressing/bathroom;Assistance with feeding;Direct supervision/assist for medications management;Direct supervision/assist for financial management;Assist for transportation;Help with stairs or ramp for entrance;Supervision due to cognitive status   Equipment Recommendations  None recommended by OT    Recommendations for Other Services      Precautions / Restrictions Precautions Precautions: Fall Recall of Precautions/Restrictions: Impaired Restrictions Weight  Bearing Restrictions Per Provider Order: No       Mobility Bed Mobility Overal bed mobility: Needs Assistance Bed Mobility: Supine to Sit, Sit to Supine     Supine to sit: Max assist, HOB elevated Sit to supine: Max assist, HOB elevated        Transfers Overall transfer level: Needs assistance   Transfers: Sit to/from Stand Sit to Stand: Max assist                 Balance Overall balance assessment: Needs assistance Sitting-balance support: Feet supported, Bilateral upper extremity supported Sitting balance-Leahy Scale: Fair                                     ADL either performed or assessed with clinical judgement   ADL                                              Extremity/Trunk Assessment Upper Extremity Assessment Upper Extremity Assessment: Generalized weakness   Lower Extremity Assessment Lower Extremity Assessment: Defer to PT evaluation        Vision Patient Visual Report: No change from baseline     Perception Perception Perception: Not tested   Praxis Praxis Praxis: Not tested   Communication Communication Communication: Impaired Factors Affecting Communication: Difficulty expressing self   Cognition Arousal: Alert Behavior During Therapy: Flat affect Cognition: Cognition impaired   Orientation impairments: Place, Time, Situation   Memory impairment (select all impairments): Short-term memory, Working memory Attention impairment (select first level of impairment): Focused attention Executive functioning impairment (select all impairments): Initiation, Reasoning, Problem solving  Following commands: Impaired Following commands impaired: Follows one step commands inconsistently, Follows one step commands with increased time      Cueing   Cueing Techniques: Verbal cues, Tactile cues  Exercises      Shoulder Instructions       General Comments  VSS on RA     Pertinent Vitals/ Pain       Pain Assessment Pain Assessment: Faces Faces Pain Scale: Hurts a little bit Pain Location: Back and hips Pain Descriptors / Indicators: Grimacing, Sore Pain Intervention(s): Monitored during session                                                          Frequency  Min 2X/week        Progress Toward Goals  OT Goals(current goals can now be found in the care plan section)  Progress towards OT goals: Progressing toward goals  Acute Rehab OT Goals Patient Stated Goal: Go home OT Goal Formulation: With patient Time For Goal Achievement: 10/08/23 Potential to Achieve Goals: Fair  Plan      Co-evaluation                 AM-PAC OT 6 Clicks Daily Activity     Outcome Measure   Help from another person eating meals?: A Lot Help from another person taking care of personal grooming?: A Lot Help from another person toileting, which includes using toliet, bedpan, or urinal?: A Lot Help from another person bathing (including washing, rinsing, drying)?: A Lot Help from another person to put on and taking off regular upper body clothing?: A Lot Help from another person to put on and taking off regular lower body clothing?: A Lot 6 Click Score: 12    End of Session Equipment Utilized During Treatment: Oxygen  OT Visit Diagnosis: Muscle weakness (generalized) (M62.81);Other abnormalities of gait and mobility (R26.89);Unsteadiness on feet (R26.81);Other symptoms and signs involving cognitive function   Activity Tolerance Other (comment) (Patient limited by cognition)   Patient Left in bed;with call bell/phone within reach   Nurse Communication Mobility status        Time: 1100-1120 OT Time Calculation (min): 20 min  Charges: OT General Charges $OT Visit: 1 Visit OT Treatments $Therapeutic Activity: 8-22 mins  09/28/2023  RP, OTR/L  Acute Rehabilitation Services  Office:  [phone]   Charlie JONETTA Halsted 09/28/2023, 11:58 AM

## 2023-09-28 NOTE — Progress Notes (Signed)
 [facility] Progress Note Patient: Donna Sexton FMW:[id number] DOB: May 04, 1950  DOA: 09/23/2023 DOS: the patient was seen and examined on 09/28/2023  Brief Hospital Course: 73 y.o. female with medical history significant of bipolar disorder anemia and anxiety asthma GERD, IBS, HL,D HTN, osteopenia overactive bladder, sleep apnea, prediabetes, recurrent UTIs ,paroxysmal atrial fibrillation ,chronic diastolic CHF, Presented with nausea, vomiting and altered mentation. She is being treated for a possible PNA.  Assessment & Plan: Acute metabolic encephalopathy, POA:  Most likely multifactorial secondary to dehydration, polypharmacy and possible PNA. CT head done in ED didn't show any acute abnormalities Continue to hold psychotropic medications. Repeat CT unremarkable. No focal deficit. Mentation improving.  Acute respiratory failure with hypoxia,POA:  pneumonia. Chest x-ray showing left pleural effusion and left basilar atelectasis right mild right basilar atelectasis and low lung volumes Patient is currently on anticoagulation, continue Eliquis  CT showed Consolidation/atelectasis in the lung bases left greater than right as before. Leukocytosis has resolved now. Treating for possible underlying pneumonia SLP saw the patient.  Recommended regular diet.   Atrial fibrillation with rapid ventricular response, POA: rate controlled now Continue diltiazem  p.o. with holding parameters  continue amiodarone  200 mg daily  continue Eliquis  5 mg p.o. twice daily as per cardiology recommendations Cardiology on board F/u ECHO-EF 50-55%   Bipolar disorder  Continue home medications   Chronic heart failure with preserved ejection fraction Currently, she appears to be somewhat euvolemic   Class 3 obesity Body mass index is 40.15 kg/m.  Placing the patient at high risk for: Possibility of obesity hypoventilation   Constipation Bowel regimen ordered   Essential hypertension Prn  antihypertensives   Hyperlipidemia Continue Lipitor 40 mg daily   Poor oral intake,POA:  With improvement in mentation anticipate improvement in oral intake.   CAP (community acquired pneumonia),possible bacterial Treated with IV Cefepime  MRSA PCR negative.   Pericardial effusion Appreciate cardiology consult ECHO showed large pericardial effusion but no tamponade.   Subjective: No nausea no vomiting no fever no chills.  Reports some headache this morning.  Physical Exam: Basal crackles. S1-S2 present.  Bowel sound present No asterixis. Trace edema.  Data Reviewed: I have Reviewed nursing notes, Vitals, and Lab results. Since last encounter, pertinent lab results CBC and BMP   . I have ordered test including CBC and BMP  .   Disposition: Status is: Inpatient Remains inpatient appropriate because: Monitor for improvement in oral intake  SCDs Start: 09/24/23 0152 apixaban  (ELIQUIS ) tablet 5 mg   Family Communication: No one at bedside Level of care: Progressive   Vitals:   09/28/23 0000 09/28/23 0400 09/28/23 0737 09/28/23 1700  BP: (!) 146/101 131/83 (!) 143/112 (!) 125/94  Pulse: (!) 101 92 (!) 104 83  Resp: (!) 38 (!) 23 (!) 28 20  Temp: 98.1 F (36.7 C) 98 F (36.7 C) 97.9 F (36.6 C) 97.9 F (36.6 C)  TempSrc: Oral Oral Oral Oral  SpO2: 99% 95% 90% 92%  Weight:         Author: Yetta Blanch, MD 09/28/2023 7:27 PM  Please look on [email or web address] to find out who is on call.

## 2023-09-29 DIAGNOSIS — J9601 Acute respiratory failure with hypoxia: Secondary | ICD-10-CM | POA: Diagnosis not present

## 2023-09-29 DIAGNOSIS — I480 Paroxysmal atrial fibrillation: Secondary | ICD-10-CM | POA: Diagnosis not present

## 2023-09-29 DIAGNOSIS — I3139 Other pericardial effusion (noninflammatory): Secondary | ICD-10-CM | POA: Diagnosis not present

## 2023-09-29 LAB — CBC
HCT: 31.1 % — ABNORMAL LOW (ref 36.0–46.0)
Hemoglobin: 10.5 g/dL — ABNORMAL LOW (ref 12.0–15.0)
MCH: 33.2 pg (ref 26.0–34.0)
MCHC: 33.8 g/dL (ref 30.0–36.0)
MCV: 98.4 fL (ref 80.0–100.0)
Platelets: 179 K/uL (ref 150–400)
RBC: 3.16 MIL/uL — ABNORMAL LOW (ref 3.87–5.11)
RDW: 12.9 % (ref 11.5–15.5)
WBC: 6.3 K/uL (ref 4.0–10.5)
nRBC: 0 % (ref 0.0–0.2)

## 2023-09-29 LAB — BASIC METABOLIC PANEL WITH GFR
Anion gap: 5 (ref 5–15)
BUN: 10 mg/dL (ref 8–23)
CO2: 26 mmol/L (ref 22–32)
Calcium: 8.7 mg/dL — ABNORMAL LOW (ref 8.9–10.3)
Chloride: 100 mmol/L (ref 98–111)
Creatinine, Ser: 0.52 mg/dL (ref 0.44–1.00)
GFR, Estimated: >60 mL/min (ref >60)
Glucose, Bld: 96 mg/dL (ref 70–99)
Potassium: 3.7 mmol/L (ref 3.5–5.1)
Sodium: 131 mmol/L — ABNORMAL LOW (ref 135–145)

## 2023-09-29 LAB — MAGNESIUM: Magnesium: 1.6 mg/dL — ABNORMAL LOW (ref 1.7–2.4)

## 2023-09-29 MED ORDER — BOOST PLUS PO LIQD
237.0000 mL | Freq: Three times a day (TID) | ORAL | Status: DC
Start: 2023-09-29 — End: 2023-10-04
  Administered 2023-09-29 – 2023-10-03 (×7): 237 mL via ORAL
  Filled 2023-09-29 (×17): qty 237

## 2023-09-29 MED ORDER — POTASSIUM CHLORIDE CRYS ER 20 MEQ PO TBCR
40.0000 meq | EXTENDED_RELEASE_TABLET | Freq: Once | ORAL | Status: AC
  Administered 2023-09-29: 40 meq via ORAL
  Filled 2023-09-29: qty 2

## 2023-09-29 MED ORDER — HYDROCODONE-ACETAMINOPHEN 5-325 MG PO TABS
1.0000 | ORAL_TABLET | ORAL | Status: DC | PRN
  Administered 2023-09-30 – 2023-10-04 (×9): 1 via ORAL
  Filled 2023-09-29 (×9): qty 1

## 2023-09-29 MED ORDER — MAGNESIUM SULFATE 2 GM/50ML IV SOLN
2.0000 g | Freq: Once | INTRAVENOUS | Status: AC
  Administered 2023-09-29: 2 g via INTRAVENOUS
  Filled 2023-09-29: qty 50

## 2023-09-29 NOTE — Progress Notes (Signed)
 [facility] Progress Note Patient: Donna Sexton FMW:[id number] DOB: 07-03-1950  DOA: 09/23/2023 DOS: the patient was seen and examined on 09/29/2023  Brief Hospital Course: 73 y.o. female with medical history significant of bipolar disorder anemia and anxiety asthma GERD, IBS, HL,D HTN, osteopenia overactive bladder, sleep apnea, prediabetes, recurrent UTIs ,paroxysmal atrial fibrillation ,chronic diastolic CHF, Presented with nausea, vomiting and altered mentation. She is being treated for a possible PNA.  Assessment & Plan: Acute metabolic encephalopathy, POA:  Most likely multifactorial secondary to dehydration, polypharmacy and possible PNA. CT head done in ED didn't show any acute abnormalities Continue to hold psychotropic medications. Repeat CT unremarkable. No focal deficit. Mentation improving.  Acute respiratory failure with hypoxia,POA:  pneumonia. Chest x-ray showing left pleural effusion and left basilar atelectasis right mild right basilar atelectasis and low lung volumes Patient is currently on anticoagulation, continue Eliquis  CT showed Consolidation/atelectasis in the lung bases left greater than right as before. Leukocytosis has resolved now. Treating for possible underlying pneumonia SLP saw the patient.  Recommended regular diet.   Atrial fibrillation with rapid ventricular response, POA: rate controlled now Continue diltiazem  p.o. with holding parameters  continue amiodarone  200 mg daily  continue Eliquis  5 mg p.o. twice daily as per cardiology recommendations Cardiology on board F/u ECHO-EF 50-55%   Bipolar disorder  Continue home medications   Chronic heart failure with preserved ejection fraction Currently, she appears to be somewhat euvolemic   Class 3 obesity Body mass index is 40.15 kg/m.  Placing the patient at high risk for: Possibility of obesity hypoventilation   Constipation Bowel regimen ordered   Essential hypertension Prn  antihypertensives   Hyperlipidemia Continue Lipitor 40 mg daily   Poor oral intake,POA:  With improvement in mentation anticipate improvement in oral intake.   CAP (community acquired pneumonia),possible bacterial Treated with IV Cefepime  MRSA PCR negative.   Pericardial effusion Appreciate cardiology consult ECHO showed large pericardial effusion but no tamponade. Cardiology recommending repeat echocardiogram next week whether inpatient or outpatient.   Subjective: No nausea, vomiting.More alert today.  Eating better today.  Physical Exam: Clear to auscultation. S1-S2 present No asterixis.  Data Reviewed: I have Reviewed nursing notes, Vitals, and Lab results. Since last encounter, pertinent lab results CBC and BMP   . I have ordered test including CBC and BMP  .   Disposition: Status is: Inpatient Remains inpatient appropriate because: Monitor for improvement in oral intake  SCDs Start: 09/24/23 0152 apixaban  (ELIQUIS ) tablet 5 mg   Family Communication: No one at bedside Level of care: Progressive   Vitals:   09/29/23 0822 09/29/23 0830 09/29/23 1219 09/29/23 1600  BP: (!) 146/97 (!) 146/97 123/62 126/89  Pulse: 94 87 97 97  Resp:  14 17 (!) 25  Temp:  98 F (36.7 C) 98.3 F (36.8 C) 98.7 F (37.1 C)  TempSrc:  Oral Oral Oral  SpO2:  97% 93% 93%  Weight:         Author: Yetta Blanch, MD 09/29/2023 6:14 PM  Please look on [email or web address] to find out who is on call.

## 2023-09-29 NOTE — Progress Notes (Signed)
 Progress Note  Patient Name: Donna Sexton Date of Encounter: 09/29/2023  Primary Cardiologist:   Peter Swaz[place], MD   Subjective   She is awake and answering questions.  No pain.  Was SOB getting up to the chair  Inpatient Medications    Scheduled Meds:  amiodarone   200 mg Oral Daily   apixaban   5 mg Oral BID   atorvastatin   40 mg Oral Daily   budesonide  (PULMICORT ) nebulizer solution  0.25 mg Nebulization BID   diltiazem   180 mg Oral Daily   divalproex   1,500 mg Oral QHS   famotidine   20 mg Oral Daily   fluticasone  furoate-vilanterol  1 puff Inhalation Daily   furosemide   40 mg Oral Daily   guaiFENesin   600 mg Oral BID   lactose free nutrition  237 mL Oral TID WC   metoprolol  tartrate  25 mg Oral BID   mirabegron  ER  25 mg Oral QHS   multivitamin with minerals  1 tablet Oral Daily   polyethylene glycol  17 g Oral Daily   senna-docusate  2 tablet Oral QHS   thiamine   100 mg Oral Daily   Continuous Infusions:  magnesium  sulfate bolus IVPB     PRN Meds: acetaminophen  **OR** acetaminophen , bisacodyl , HYDROcodone -acetaminophen , ipratropium-albuterol , ondansetron  **OR** ondansetron  (ZOFRAN ) IV   Vital Signs    Vitals:   09/28/23 1932 09/28/23 2041 09/28/23 2313 09/29/23 0300  BP: (!) 156/103 (!) 147/105 (!) 142/91 131/87  Pulse: 99 (!) 108 88 82  Resp: (!) 25  18 (!) 22  Temp: 97.9 F (36.6 C)  97.9 F (36.6 C) 98.5 F (36.9 C)  TempSrc: Oral  Oral Oral  SpO2: 93%  99% 99%  Weight:        Intake/Output Summary (Last 24 hours) at 09/29/2023 0759 Last data filed at 09/29/2023 0506 Gross per 24 hour  Intake --  Output 850 ml  Net -850 ml   Filed Weights   09/25/23 1141  Weight: 106.1 kg    Telemetry    Atrial fib with rate increased  - Personally Reviewed  ECG    NA - Personally Reviewed  Physical Exam   GEN: No  acute distress.   Neck: No  JVD Cardiac: RRR, no murmurs, rubs, or gallops.  Respiratory:     Decreased breath sounds at the  bases.  GI: Soft, nontender, non-distended, normal bowel sounds  MS:  Mild leg edema; No deformity. Neuro:   Nonfocal  Psych: Oriented and appropriate     Labs    Chemistry Recent Labs  Lab 09/23/23 1516 09/24/23 0103 09/24/23 0252 09/26/23 0533 09/27/23 0609 09/28/23 0551 09/29/23 0536  NA 137   < > 137   < > 134* 134* 131*  K 3.7   < > 4.1   < > 4.0 3.5 3.7  CL 99  --  99   < > 98 97* 100  CO2 28  --  22   < > 20* 25 26  GLUCOSE 165*  --  128*   < > 88 90 96  BUN 13  --  16   < > 15 13 10   CREATININE 0.77  --  0.81   < > 0.66 0.50 0.52  CALCIUM  9.2  --  9.3   < > 9.0 9.0 8.7*  PROT 5.9*  --  6.0*  --   --   --   --   ALBUMIN 3.2*  --  3.1*  --   --   --   --  AST 15  --  17  --   --   --   --   ALT 11  --  10  --   --   --   --   ALKPHOS 52  --  51  --   --   --   --   BILITOT 1.2  --  1.7*  --   --   --   --   GFRNONAA >60  --  >60   < > >60 >60 >60  ANIONGAP 10  --  16*   < > 16* 12 5   < > = values in this interval not displayed.     Hematology Recent Labs  Lab 09/27/23 0609 09/28/23 0551 09/29/23 0536  WBC 6.8 6.2 6.3  RBC 2.86* 2.97* 3.16*  HGB 9.6* 9.9* 10.5*  HCT 29.7* 29.7* 31.1*  MCV 103.8* 100.0 98.4  MCH 33.6 33.3 33.2  MCHC 32.3 33.3 33.8  RDW 13.2 13.1 12.9  PLT 146* 151 179    Cardiac EnzymesNo results for input(s): TROPONINI in the last 168 hours. No results for input(s): TROPIPOC in the last 168 hours.   BNP Recent Labs  Lab 09/23/23 1516  BNP 227.9*     DDimer No results for input(s): DDIMER in the last 168 hours.   Radiology    No results found.   Cardiac Studies   Echo:   1. Left ventricular ejection fraction, by estimation, is 50 to 55%. The  left ventricle has low normal function. The left ventricle has no regional  wall motion abnormalities. Left ventricular diastolic function could not  be evaluated.   2. Right ventricular systolic function is normal. The right ventricular  size is normal.   3. Left  atrial size was mild to moderately dilated.   4. Large pericardial effusion. Moderate pleural effusion in the left  lateral region.   5. The mitral valve is normal in structure. No evidence of mitral valve  regurgitation.   6. The aortic valve is normal in structure. Aortic valve regurgitation is  not visualized.   7. Aortic dilatation noted. There is dilatation of the ascending aorta,  measuring 38 mm.   8. The inferior vena cava is normal in size with <50% respiratory  variability, suggesting right atrial pressure of 8 mmHg.   9. No overt echocardiographic signs of tamponade.   Patient Profile     73 y.o. female with medical history significant of bipolar disorder, hypertension, OSA, paroxysmal atrial fibrillation, chronic diastolic heart failure who presented with acute metabolic encephalopathy we are consulted for atrial fibrillation.  She presented with nausea/vomiting and confusion.  Noted to be in A-fib with heart rates in the 100s.   Assessment & Plan    PAF:  History of PAF now persistent with acute illness.  Rate controlled.  Cardizem  dose increased this admission. She is on low dose beta blocker.   Pericardial effusion:  Pericardial effusion was read as large on the official echo. I have reviewed these images and thought this to be moderate with no tamponade physiology.  Since she appears to be having a longer stay than anticipated I would suggest repeat limited echo next week in hospital before discharge to reassess if she is still in patient.  If she is being discharged we can arrange this for next week in the office..  She is hemodynamically stable.  Echo has not been ordered yet.    Metabolic encephalopathy:  Thought to be multifactorial with  recent infection, meds, comorbid conditions.  Improved    Chronic diastolic HF:   I did give her one dose of IV diuretic.   She is on oral diuretic now.    For questions or updates, please contact [facility] Please consult  [email or web address] for contact info under Cardiology/STEMI.   Signed, Lynwood Schilling, MD  09/29/2023, 7:59 AM

## 2023-09-29 NOTE — Progress Notes (Signed)
 Physical Therapy Treatment Patient Details Name: Donna Sexton MRN: [id number] DOB: April 06, 1950 Today's Date: 09/29/2023   History of Present Illness Pt is a 73 y.o. female presenting 9/13 with chest pain, confusion, shortness of breath, N/V. Admitted for acute respiratory failure with hypoxia, metabolic encephalopathy. CT reveals Left pleural effusion and left basilar atelectasis.  Recent hospitalization 8/26-9/2 for UTI/sepsis.  PMH: bipolar disorder, anemia, anxiety, asthma GERD, IBS, HLD, HTN, osteopenia, overactive bladder, sleep apnea, prediabetes, recurrent UTIs, paroxysmal atrial fibrillation, chronic diastolic CHF    PT Comments  Pt required max assist bed mobility, max assist sit to stand from highly elevated bed, and dependent stedy transfer bed to recliner. Pt in recliner with feet elevated at end of session. Current POC remains appropriate.     If plan is discharge home, recommend the following: Two people to help with walking and/or transfers;Two people to help with bathing/dressing/bathroom;Assistance with cooking/housework;Assistance with feeding;Direct supervision/assist for medications management;Direct supervision/assist for financial management;Assist for transportation;Help with stairs or ramp for entrance;Supervision due to cognitive status   Can travel by private vehicle     No  Equipment Recommendations  Hospital bed;Hoyer lift    Recommendations for Other Services       Precautions / Restrictions Precautions Precautions: Fall Recall of Precautions/Restrictions: Impaired     Mobility  Bed Mobility Overal bed mobility: Needs Assistance Bed Mobility: Supine to Sit     Supine to sit: Max assist, HOB elevated, Used rails     General bed mobility comments: increased time, step by step verbal cues for sequencing, assist with trunk and BLE, use of bed pad to scoot to EOB    Transfers Overall transfer level: Needs assistance   Transfers: Sit to/from  Stand Sit to Stand: Max assist, From elevated surface           General transfer comment: sit to stand in stedy from highly elevated bed. Increased time to attain adequate stance for stedy seat to be placed. Transfer via Lift Equipment: Stedy  Ambulation/Gait               General Gait Details: unable   Comptroller Bed    Modified Rankin (Stroke Patients Only)       Balance Overall balance assessment: Needs assistance Sitting-balance support: Feet supported, Bilateral upper extremity supported Sitting balance-Leahy Scale: Fair     Standing balance support: Bilateral upper extremity supported, Reliant on assistive device for balance Standing balance-Leahy Scale: Zero                              Communication Communication Communication: Impaired Factors Affecting Communication: Difficulty expressing self  Cognition Arousal: Alert Behavior During Therapy: Flat affect   PT - Cognitive impairments: Orientation, Awareness, Memory, Attention, Initiation, Sequencing, Problem solving, Safety/Judgement   Orientation impairments: Time                   PT - Cognition Comments: more alert and engaged today Following commands: Impaired Following commands impaired: Follows one step commands with increased time    Cueing Cueing Techniques: Verbal cues, Tactile cues  Exercises      General Comments General comments (skin integrity, edema, etc.): Initially on RA with SpO2 in 90s. Desat to 87% after mobility. Placed on 2L with improvement to 91%.      Pertinent Vitals/Pain Pain  Assessment Pain Assessment: Faces Faces Pain Scale: No hurt    Home Living                          Prior Function            PT Goals (current goals can now be found in the care plan section) Acute Rehab PT Goals Patient Stated Goal: not stated Progress towards PT goals: Progressing toward goals     Frequency    Min 2X/week      PT Plan      Co-evaluation              AM-PAC PT 6 Clicks Mobility   Outcome Measure  Help needed turning from your back to your side while in a flat bed without using bedrails?: A Lot Help needed moving from lying on your back to sitting on the side of a flat bed without using bedrails?: A Lot Help needed moving to and from a bed to a chair (including a wheelchair)?: Total Help needed standing up from a chair using your arms (e.g., wheelchair or bedside chair)?: Total Help needed to walk in hospital room?: Total Help needed climbing 3-5 steps with a railing? : Total 6 Click Score: 8    End of Session Equipment Utilized During Treatment: Oxygen;Gait belt Activity Tolerance: Patient tolerated treatment well Patient left: in chair;with call bell/phone within reach;with chair alarm set;with family/visitor present Nurse Communication: Mobility status;Need for lift equipment;Other (comment) (placed on 2L) PT Visit Diagnosis: Unsteadiness on feet (R26.81);Muscle weakness (generalized) (M62.81);Difficulty in walking, not elsewhere classified (R26.2)     Time: 9198-9176 PT Time Calculation (min) (ACUTE ONLY): 22 min  Charges:    $Therapeutic Activity: 8-22 mins PT General Charges $$ ACUTE PT VISIT: 1 Visit                     Sari MATSU., PT  Office # [phone]    Erven Sari Shaker 09/29/2023, 8:49 AM

## 2023-09-29 NOTE — TOC Progression Note (Signed)
 Transition of Care [facility]) - Progression Note    Patient Details  Name: Donna Sexton MRN: [id number] Date of Birth: Oct 02, 1950  Transition of Care [facility]) CM/SW Contact  Whitfield Campanile, Student-Social Work Phone Number: 09/29/2023, 12:11 PM  Clinical Narrative:     Patient will go back to [facility] once medically stable.  TOC continuing to follow.   Whitfield Campanile, MSW Intern  Expected Discharge Plan: Skilled Nursing Facility Barriers to Discharge: Insurance Authorization, Continued Medical Work up               Expected Discharge Plan and Services In-house Referral: Clinical Social Work     Living arrangements for the past 2 months: Skilled Nursing Facility                                       Social Drivers of Health (SDOH) Interventions SDOH Screenings   Food Insecurity: Patient Unable To Answer (09/24/2023)  Recent Concern: Food Insecurity - Food Insecurity Present (06/26/2023)  Housing: Unknown (09/24/2023)  Transportation Needs: Patient Unable To Answer (09/24/2023)  Utilities: Patient Unable To Answer (09/24/2023)  Alcohol Screen: Low Risk  (03/28/2022)  Depression (PHQ2-9): Medium Risk (06/26/2023)  Financial Resource Strain: Low Risk  (06/26/2023)  Physical Activity: Inactive (06/26/2023)  Social Connections: Unknown (09/24/2023)  Recent Concern: Social Connections - Socially Isolated (09/05/2023)  Stress: Stress Concern Present (06/26/2023)  Tobacco Use: Low Risk  (09/23/2023)    Readmission Risk Interventions    09/09/2023   12:46 PM  Readmission Risk Prevention Plan  Transportation Screening Complete  PCP or Specialist Appt within 3-5 Days Complete  HRI or Home Care Consult Complete  Social Work Consult for Recovery Care Planning/Counseling Complete  Palliative Care Screening Not Applicable  Medication Review Oceanographer) Complete

## 2023-09-29 NOTE — Progress Notes (Signed)
 Nutrition Follow-up  DOCUMENTATION CODES:   Not applicable  INTERVENTION:  Encourage PO intake - Diet advanced to Regular with thin liquids per SLP recommendations  House trays as pt not ordering meals Nursing to assist with meals   Boost Plus TID after meals, each supplement provides 360 kcal, 14 gm protein Mighty Shake TID with meals, each supplement provides 330 kcals and 9 grams of protein MVI with minerals daily 100 mg Thiamine  daily  MD to start appetite stimulant (Marinol) today If intake does not improve recommend cortrak placement next week   NUTRITION DIAGNOSIS:   Inadequate oral intake related to acute illness as evidenced by meal completion < 25%. - Ongoing   GOAL:   Patient will meet greater than or equal to 90% of their needs - Ongoing   MONITOR:   PO intake, Supplement acceptance, Diet advancement, Labs  REASON FOR ASSESSMENT:   Consult Assessment of nutrition requirement/status  ASSESSMENT:   73 y.o. female with PMH of bipolar disorder, anemia and anxiety, asthma GERD, IBS, HL,D HTN, osteopenia, overactive bladder, sleep apnea, prediabetes, recurrent UTIs ,PAF, ,chronic diastolic CHF, Presented with nausea, vomiting and altered confusion. Being treated for PNA. Recent admit with UTI and sepsis.  Pt up in chair with ex husband at bedside. Pt continues to be slightly confused, cannot remember what she had to eat yesterday. Ex husband reports pt still with very poor PO intake, only eating jello, refuses supplements or only taking sips at a time. Pt reports she gets full fast but is unable to express why she does not want to eat solid food. Seems to have no interest in eating. Denies nausea, vomiting, abdominal pain.   Pt was placed on room service with assist in hopes pt would be able to order meals for herself unfortunately pt has only been ordering jello and nothing of nutritional sustenance. Will switch to house trays to see if pt will eat any food if  presented to her. Discussed with ex-husband and also went over menu ordering with him. Encouraged increased po intake with pt.   Discussed nutritional status with MD. Pt continues to be altered with poor po intake now for at least 6 days. MD wanted to try Marinol before cortrak placement.   Admit weight: 106.1 kg - Fluid? Current weight: 106.1 kg  +1 Edema RLE, LUE, LLE, RLE  Average Meal Intake: 9/15: 25% intake x 1 recorded meals 9/19: 100% intake x 1 recorded meal (only had 2 jellos ordered)  Nutritionally Relevant Medications: Scheduled Meds:  lactose free nutrition  237 mL Oral TID WC   multivitamin with minerals  1 tablet Oral Daily   senna-docusate  2 tablet Oral QHS   thiamine   100 mg Oral Daily   Labs Reviewed: Sodium 131 Magnesium  1.6 CBG ranges from 90-96 mg/dL over the last 24 hours HgbA1c 6.2  Diet Order:   Diet Order             Diet regular Room service appropriate? No; Fluid consistency: Thin  Diet effective now                   EDUCATION NEEDS:   Not appropriate for education at this time  Skin:  Skin Assessment: Reviewed RN Assessment  Last BM:  9/17 - type 6  Height:   Ht Readings from Last 1 Encounters:  09/05/23 5' 4 (1.626 m)    Weight:   Wt Readings from Last 1 Encounters:  09/25/23 106.1 kg  Ideal Body Weight:  54.5 kg  BMI:  Body mass index is 40.15 kg/m.  Estimated Nutritional Needs:   Kcal:  1900-2100 kcal  Protein:  110-130 gm  Fluid:  >1.9L/day   Olivia Kenning, RD Registered Dietitian  See Amion for more information

## 2023-09-30 DIAGNOSIS — J9601 Acute respiratory failure with hypoxia: Secondary | ICD-10-CM | POA: Diagnosis not present

## 2023-09-30 LAB — MAGNESIUM: Magnesium: 1.7 mg/dL (ref 1.7–2.4)

## 2023-09-30 LAB — BASIC METABOLIC PANEL WITH GFR
Anion gap: 10 (ref 5–15)
BUN: 8 mg/dL (ref 8–23)
CO2: 26 mmol/L (ref 22–32)
Calcium: 8.8 mg/dL — ABNORMAL LOW (ref 8.9–10.3)
Chloride: 98 mmol/L (ref 98–111)
Creatinine, Ser: 0.5 mg/dL (ref 0.44–1.00)
GFR, Estimated: >60 mL/min (ref >60)
Glucose, Bld: 93 mg/dL (ref 70–99)
Potassium: 3.5 mmol/L (ref 3.5–5.1)
Sodium: 134 mmol/L — ABNORMAL LOW (ref 135–145)

## 2023-09-30 LAB — CBC
HCT: 29 % — ABNORMAL LOW (ref 36.0–46.0)
Hemoglobin: 10.1 g/dL — ABNORMAL LOW (ref 12.0–15.0)
MCH: 33.7 pg (ref 26.0–34.0)
MCHC: 34.8 g/dL (ref 30.0–36.0)
MCV: 96.7 fL (ref 80.0–100.0)
Platelets: 209 K/uL (ref 150–400)
RBC: 3 MIL/uL — ABNORMAL LOW (ref 3.87–5.11)
RDW: 13.2 % (ref 11.5–15.5)
WBC: 7.4 K/uL (ref 4.0–10.5)
nRBC: 0 % (ref 0.0–0.2)

## 2023-09-30 MED ORDER — DRONABINOL 2.5 MG PO CAPS: 2.5000 mg | ORAL_CAPSULE | Freq: Two times a day (BID) | ORAL | Status: DC

## 2023-09-30 MED ORDER — MEGESTROL ACETATE 400 MG/10ML PO SUSP
400.0000 mg | Freq: Every day | ORAL | Status: DC
  Administered 2023-09-30: 400 mg via ORAL
  Filled 2023-09-30 (×2): qty 10

## 2023-09-30 NOTE — Progress Notes (Signed)
   Chart reviewed- patient remains in rate-controlled afib on oral lasix . LVEF 50-55%, moderate to large pericardial effusion. Will order limited repeat echo for Monday. Will be available as needed over the weekend.  Vinie KYM Maxcy, MD, [facility], FNLA, FACP  [place]  [facility]  Medical Director of the [facility]  Cardiovascular Risk Reduction Clinic Diplomate of the American Board of Clinical Lipidology Attending Cardiologist  Direct Dial: [phone]  Fax: [phone]  Website:  [email or web address]

## 2023-09-30 NOTE — Progress Notes (Signed)
 [facility] Progress Note Patient: Donna Sexton FMW:[id number] DOB: 11-19-1950  DOA: 09/23/2023 DOS: the patient was seen and examined on 09/30/2023  Brief Hospital Course: 73 y.o. female with medical history significant of bipolar disorder anemia and anxiety asthma GERD, IBS, HL,D HTN, osteopenia overactive bladder, sleep apnea, prediabetes, recurrent UTIs ,paroxysmal atrial fibrillation ,chronic diastolic CHF, Presented with nausea, vomiting and altered mentation. She is being treated for a possible PNA.  Assessment & Plan: Acute metabolic encephalopathy, POA:  Most likely multifactorial secondary to dehydration, polypharmacy and possible PNA. CT head done in ED didn't show any acute abnormalities Continue to hold psychotropic medications. Repeat CT unremarkable. No focal deficit. Mentation improving.  Acute respiratory failure with hypoxia,POA:  pneumonia. Chest x-ray showing left pleural effusion and left basilar atelectasis right mild right basilar atelectasis and low lung volumes Patient is currently on anticoagulation, continue Eliquis  CT showed Consolidation/atelectasis in the lung bases left greater than right as before. Leukocytosis has resolved now. Treated for possible underlying pneumonia SLP saw the patient.  Recommended regular diet.   Atrial fibrillation with rapid ventricular response, POA: rate controlled now Continue diltiazem  p.o. with holding parameters  continue amiodarone  200 mg daily  continue Eliquis  5 mg p.o. twice daily as per cardiology recommendations Cardiology on board F/u ECHO-EF 50-55%   Bipolar disorder  Continue home medications   Chronic heart failure with preserved ejection fraction Currently, she appears to be somewhat euvolemic   Class 3 obesity Body mass index is 40.15 kg/m.  Placing the patient at high risk for: Possibility of obesity hypoventilation   Constipation Bowel regimen ordered   Essential hypertension Prn  antihypertensives   Hyperlipidemia Continue Lipitor 40 mg daily   Poor oral intake,POA:  Continues to have minimal oral intake. Marinol  was recommended although due to backorder unable to provide that medication here in the hospital. Given that the patient is already on Eliquis  Megace  will be attempted and will monitor.   CAP (community acquired pneumonia),possible bacterial Treated with IV Cefepime  MRSA PCR negative.   Pericardial effusion Appreciate cardiology consult ECHO showed large pericardial effusion but no tamponade. Cardiology recommending repeat echocardiogram next week whether inpatient or outpatient.   Subjective: No nausea no vomiting no fever no chills.  No chest pain.  Minimal oral intake.  Physical Exam: Clear to auscultation. S1-S2 present Bowel sound present Trace edema.  Data Reviewed: I have Reviewed nursing notes, Vitals, and Lab results. Since last encounter, pertinent lab results CBC and BMP   . I have ordered test including CBC and BMP  .   Disposition: Status is: Inpatient Remains inpatient appropriate because: Monitor for improvement in oral intake.  Monitor electrolytes.  SCDs Start: 09/24/23 0152 apixaban  (ELIQUIS ) tablet 5 mg    Family Communication: Husband at bedside Level of care: Progressive   Vitals:   09/30/23 0323 09/30/23 0816 09/30/23 1318 09/30/23 1643  BP: 119/86 (!) 148/98 111/75 130/85  Pulse: (!) 105 (!) 105  88  Resp: 18 20 (!) 21 16  Temp: 98.6 F (37 C) 98.6 F (37 C) 99.1 F (37.3 C) (!) 97.4 F (36.3 C)  TempSrc: Axillary Oral Oral Oral  SpO2: 91% 92% 95% 94%  Weight:         Author: Yetta Blanch, MD 09/30/2023 5:12 PM  Please look on [email or web address] to find out who is on call.

## 2023-10-01 DIAGNOSIS — J9601 Acute respiratory failure with hypoxia: Secondary | ICD-10-CM | POA: Diagnosis not present

## 2023-10-01 LAB — CBC
HCT: 28.1 % — ABNORMAL LOW (ref 36.0–46.0)
Hemoglobin: 9.8 g/dL — ABNORMAL LOW (ref 12.0–15.0)
MCH: 33.4 pg (ref 26.0–34.0)
MCHC: 34.9 g/dL (ref 30.0–36.0)
MCV: 95.9 fL (ref 80.0–100.0)
Platelets: 204 K/uL (ref 150–400)
RBC: 2.93 MIL/uL — ABNORMAL LOW (ref 3.87–5.11)
RDW: 13.4 % (ref 11.5–15.5)
WBC: 8.3 K/uL (ref 4.0–10.5)
nRBC: 0 % (ref 0.0–0.2)

## 2023-10-01 LAB — BASIC METABOLIC PANEL WITH GFR
Anion gap: 7 (ref 5–15)
BUN: 5 mg/dL — ABNORMAL LOW (ref 8–23)
CO2: 29 mmol/L (ref 22–32)
Calcium: 8.9 mg/dL (ref 8.9–10.3)
Chloride: 97 mmol/L — ABNORMAL LOW (ref 98–111)
Creatinine, Ser: 0.69 mg/dL (ref 0.44–1.00)
GFR, Estimated: >60 mL/min (ref >60)
Glucose, Bld: 107 mg/dL — ABNORMAL HIGH (ref 70–99)
Potassium: 3.7 mmol/L (ref 3.5–5.1)
Sodium: 133 mmol/L — ABNORMAL LOW (ref 135–145)

## 2023-10-01 LAB — MAGNESIUM: Magnesium: 1.6 mg/dL — ABNORMAL LOW (ref 1.7–2.4)

## 2023-10-01 MED ORDER — OLANZAPINE 2.5 MG PO TABS
2.5000 mg | ORAL_TABLET | Freq: Every day | ORAL | Status: DC
Start: 2023-10-01 — End: 2023-10-02
  Administered 2023-10-01: 2.5 mg via ORAL
  Filled 2023-10-01: qty 1

## 2023-10-01 MED ORDER — MAGNESIUM SULFATE 2 GM/50ML IV SOLN
2.0000 g | Freq: Once | INTRAVENOUS | Status: AC
Start: 2023-10-01 — End: 2023-10-01
  Administered 2023-10-01: 2 g via INTRAVENOUS
  Filled 2023-10-01: qty 50

## 2023-10-01 MED ORDER — MEGESTROL ACETATE 400 MG/10ML PO SUSP
400.0000 mg | Freq: Two times a day (BID) | ORAL | Status: DC
  Administered 2023-10-01 – 2023-10-03 (×5): 400 mg via ORAL
  Filled 2023-10-01 (×6): qty 10

## 2023-10-01 MED ORDER — POTASSIUM CHLORIDE CRYS ER 20 MEQ PO TBCR
40.0000 meq | EXTENDED_RELEASE_TABLET | ORAL | Status: AC
Start: 2023-10-01 — End: 2023-10-01
  Administered 2023-10-01 (×2): 40 meq via ORAL
  Filled 2023-10-01 (×2): qty 2

## 2023-10-01 NOTE — Progress Notes (Signed)
 [facility] Progress Note Patient: Donna Sexton FMW:[id number] DOB: May 07, 1950  DOA: 09/23/2023 DOS: the patient was seen and examined on 10/01/2023  Brief Hospital Course: 73 y.o. female with medical history significant of bipolar disorder anemia and anxiety asthma GERD, IBS, HL,D HTN, osteopenia overactive bladder, sleep apnea, prediabetes, recurrent UTIs ,paroxysmal atrial fibrillation ,chronic diastolic CHF, Presented with nausea, vomiting and altered mentation. She is being treated for a possible PNA.  Assessment & Plan: Acute metabolic encephalopathy, POA:  Most likely multifactorial secondary to dehydration, polypharmacy and possible PNA. CT head done in ED didn't show any acute abnormalities Continue to hold psychotropic medications. Repeat CT unremarkable. No focal deficit. Mentation improving.  Acute respiratory failure with hypoxia,POA:  pneumonia. Chest x-ray showing left pleural effusion and left basilar atelectasis right mild right basilar atelectasis and low lung volumes Patient is currently on anticoagulation, continue Eliquis  CT showed Consolidation/atelectasis in the lung bases left greater than right as before. Leukocytosis has resolved now. Treated for possible underlying pneumonia SLP saw the patient.  Recommended regular diet.   Atrial fibrillation with rapid ventricular response, POA: rate controlled now Continue diltiazem  p.o. with holding parameters  continue amiodarone  200 mg daily  continue Eliquis  5 mg p.o. twice daily as per cardiology recommendations Cardiology on board F/u ECHO-EF 50-55%   Bipolar disorder  Continue home medications Psychiatry consulted as patient continues with poor p.o. intake concern for possible ongoing bipolar disorder with depression issues.   Chronic heart failure with preserved ejection fraction Currently, she appears to be somewhat euvolemic   Class 3 obesity Body mass index is 40.15 kg/m.  Placing the patient at  high risk for: Possibility of obesity hypoventilation   Constipation Bowel regimen ordered   Essential hypertension Prn antihypertensives   Hyperlipidemia Continue Lipitor 40 mg daily   Poor oral intake,POA:  Continues to have minimal oral intake. Marinol  was recommended although due to backorder unable to provide that medication here in the hospital. Given that the patient is already on Eliquis  Megace  will be attempted and will monitor. Palliative care consulted.  Patient has been on Megace .   CAP (community acquired pneumonia),possible bacterial Treated with IV Cefepime  MRSA PCR negative.   Pericardial effusion Appreciate cardiology consult ECHO showed large pericardial effusion but no tamponade. Cardiology recommending repeat echocardiogram next week whether inpatient or outpatient.   Subjective: Patient with poor p.o. intake.  Patient tells me that she is eating but the staff reports that she is just not eating any substantial amount of food.  No nausea no vomiting at the time of my evaluation but reported some nausea last night.  Physical Exam: Clear to auscultation. S1-S2 present Bowel sound present, nontender. Edema improving.  Data Reviewed: I have Reviewed nursing notes, Vitals, and Lab results. Since last encounter, pertinent lab results CBC and BMP   . I have ordered test including CBC and BMP  .   Disposition: Status is: Inpatient Remains inpatient appropriate because: Monitor for improvement in oral intake  SCDs Start: 09/24/23 0152 apixaban  (ELIQUIS ) tablet 5 mg   Family Communication: No one at bedside Level of care: Progressive   Vitals:   10/01/23 0320 10/01/23 0753 10/01/23 1202 10/01/23 1540  BP: 121/85 135/65 108/66 91/60  Pulse: 88 (!) 102 91 91  Resp: 20 20 (!) 24 18  Temp: 98.5 F (36.9 C) 98.8 F (37.1 C) 98.1 F (36.7 C) 98.5 F (36.9 C)  TempSrc: Oral Oral Oral Oral  SpO2: 94% 94% 95% 91%  Weight:  Author: Yetta Blanch,  MD 10/01/2023 7:22 PM  Please look on [email or web address] to find out who is on call.

## 2023-10-02 ENCOUNTER — Inpatient Hospital Stay (HOSPITAL_COMMUNITY)

## 2023-10-02 DIAGNOSIS — J9601 Acute respiratory failure with hypoxia: Secondary | ICD-10-CM | POA: Diagnosis not present

## 2023-10-02 DIAGNOSIS — I4891 Unspecified atrial fibrillation: Secondary | ICD-10-CM | POA: Diagnosis not present

## 2023-10-02 DIAGNOSIS — I5032 Chronic diastolic (congestive) heart failure: Secondary | ICD-10-CM | POA: Diagnosis not present

## 2023-10-02 DIAGNOSIS — Z515 Encounter for palliative care: Secondary | ICD-10-CM

## 2023-10-02 DIAGNOSIS — Z66 Do not resuscitate: Secondary | ICD-10-CM

## 2023-10-02 DIAGNOSIS — I3139 Other pericardial effusion (noninflammatory): Secondary | ICD-10-CM | POA: Diagnosis not present

## 2023-10-02 DIAGNOSIS — J189 Pneumonia, unspecified organism: Secondary | ICD-10-CM | POA: Diagnosis not present

## 2023-10-02 DIAGNOSIS — E441 Mild protein-calorie malnutrition: Secondary | ICD-10-CM

## 2023-10-02 LAB — CBC
HCT: 30.4 % — ABNORMAL LOW (ref 36.0–46.0)
Hemoglobin: 10.3 g/dL — ABNORMAL LOW (ref 12.0–15.0)
MCH: 33 pg (ref 26.0–34.0)
MCHC: 33.9 g/dL (ref 30.0–36.0)
MCV: 97.4 fL (ref 80.0–100.0)
Platelets: 267 K/uL (ref 150–400)
RBC: 3.12 MIL/uL — ABNORMAL LOW (ref 3.87–5.11)
RDW: 13.6 % (ref 11.5–15.5)
WBC: 9.3 K/uL (ref 4.0–10.5)
nRBC: 0 % (ref 0.0–0.2)

## 2023-10-02 LAB — ECHOCARDIOGRAM LIMITED
Est EF: 55
Weight: 3774.28 [oz_av]

## 2023-10-02 LAB — BASIC METABOLIC PANEL WITH GFR
Anion gap: 11 (ref 5–15)
BUN: 6 mg/dL — ABNORMAL LOW (ref 8–23)
CO2: 24 mmol/L (ref 22–32)
Calcium: 9 mg/dL (ref 8.9–10.3)
Chloride: 96 mmol/L — ABNORMAL LOW (ref 98–111)
Creatinine, Ser: 0.5 mg/dL (ref 0.44–1.00)
GFR, Estimated: >60 mL/min (ref >60)
Glucose, Bld: 93 mg/dL (ref 70–99)
Potassium: 4.7 mmol/L (ref 3.5–5.1)
Sodium: 131 mmol/L — ABNORMAL LOW (ref 135–145)

## 2023-10-02 LAB — MAGNESIUM: Magnesium: 1.8 mg/dL (ref 1.7–2.4)

## 2023-10-02 MED ORDER — MIRTAZAPINE 15 MG PO TABS
7.5000 mg | ORAL_TABLET | Freq: Every day | ORAL | Status: DC
Start: 2023-10-02 — End: 2023-10-04
  Administered 2023-10-02 – 2023-10-03 (×2): 7.5 mg via ORAL
  Filled 2023-10-02 (×2): qty 1

## 2023-10-02 NOTE — Progress Notes (Signed)
  Echocardiogram 2D Echocardiogram has been performed.  Tinnie FORBES Gosling RDCS 10/02/2023, 3:14 PM

## 2023-10-02 NOTE — Progress Notes (Signed)
 Nutrition Follow-up  DOCUMENTATION CODES:   Not applicable  INTERVENTION:  Encourage PO intake - Diet advanced to Regular with thin liquids per SLP recommendations  Room service with assist  Nursing to assist with meals   Boost Plus TID after meals, each supplement provides 360 kcal, 14 gm protein Mighty Shake TID with meals, each supplement provides 330 kcals and 9 grams of protein MVI with minerals daily 100 mg Thiamine  daily  If intake does not improve recommend cortrak placement  48 hour calorie count   NUTRITION DIAGNOSIS:   Inadequate oral intake related to acute illness as evidenced by meal completion < 25%. - ongoing   GOAL:   Patient will meet greater than or equal to 90% of their needs - Ongoing   MONITOR:   PO intake, Supplement acceptance, Diet advancement, Labs  REASON FOR ASSESSMENT:   Consult Assessment of nutrition requirement/status  ASSESSMENT:   73 y.o. female with PMH of bipolar disorder, anemia and anxiety, asthma GERD, IBS, HL,D HTN, osteopenia, overactive bladder, sleep apnea, prediabetes, recurrent UTIs ,PAF, ,chronic diastolic CHF, Presented with nausea, vomiting and altered confusion. Being treated for PNA. Recent admit with UTI and sepsis.   Pt resting in bed with ex husband at bedside. Pt continues to be slightly confused and agitated. She thinks she is eating all day however nursing reports pt is only eating jello, applesauce, sips of Boost Plus and bites of meals. Refuses most ONS.  Only had 1 ice pop and 1 applesauce this morning. Pt reports she gets full fast but is unable to express why she does not want to eat solid food. Seems to have no interest in eating. Does have moderate to large pericardial effusion which may be contributing to poor intake. Echo today to re-assess. Denies nausea, vomiting, abdominal pain.   Had meeting with palliative today where pt expressed she could not order what she wanted and this has been a barrier to her PO  intake. Also a picky eater.  Pt was placed on room service with assist on 9/15 in hopes pt would be able to order meals for herself unfortunately pt was only ordering jello.  Has been on house trays since to see if pt will eat any food if presented to her. Does not seem to help. Will change back to room service with assist. RD previously went over menu ordering with pt and ex-husband. Encouraged family/friends to bring in food from outside.   Discussed nutritional status with MD. Pt continues to be slightly altered with poor po intake now for at least 9 days. MD started Remeron  and Megace  in the hopes it would stimulate appetite. Unfortunately appetite stimulants can take several weeks to work. MD does not want to have cortrak placed, thinks pt's poor intake is depression related. Started calorie count to access PO intake. Ongoing GOC with Palliative. Psych following.   Admit weight: 106.1 kg - Fluid? Current weight: 106.1 kg  +1 Edema RLE, LUE, LLE, RLE   Average Meal Intake: 9/15: 25% intake x 1 recorded meals 9/19: 100% intake x 1 recorded meal (only had 2 jellos ordered)  Nutritionally Relevant Medications: Scheduled Meds:  furosemide   40 mg Oral Daily   lactose free nutrition  237 mL Oral TID PC   megestrol   400 mg Oral BID   mirtazapine   7.5 mg Oral QHS   multivitamin with minerals  1 tablet Oral Daily   senna-docusate  2 tablet Oral QHS   thiamine   100 mg Oral Daily  Labs Reviewed: Sodium 131 BUN 6 CBG ranges from 93-107 mg/dL over the last 24 hours HgbA1c 6.2  Diet Order:   Diet Order             Diet regular Room service appropriate? Yes; Fluid consistency: Thin  Diet effective now                   EDUCATION NEEDS:   Not appropriate for education at this time  Skin:  Skin Assessment: Reviewed RN Assessment  Last BM:  9/20 - x 2, type 6  Height:   Ht Readings from Last 1 Encounters:  09/05/23 5' 4 (1.626 m)    Weight:   Wt Readings from Last 1  Encounters:  09/25/23 106.1 kg    Ideal Body Weight:  54.5 kg  BMI:  Body mass index is 40.15 kg/m.  Estimated Nutritional Needs:   Kcal:  1900-2100 kcal  Protein:  110-130 gm  Fluid:  >1.9L/day   Olivia Kenning, RD Registered Dietitian  See Amion for more information

## 2023-10-02 NOTE — Consult Note (Signed)
 [facility]iatry Face-to-Face Initial Psychiatric Evaluation   Service Date: October 02, 2023 LOS:  LOS: 9 days    Assessment   Donna Sexton is a 73 y.o. female admitted medically on 09/23/2023  2:20 PM for acute metabolic encephalopathy and acute respiratory failure with hypoxia.  Psychiatric history is significant bipolar disorder and has a past medical history of asthma, GERD, IBS, HLD, HTN, CHF, paroxysmal a fib. Psychiatry was consulted for lack of PO intake and concern for possibly worsening depression 2/2  bipolar disorder and assistance with medication management by primary team.   On interview today, patient's lack of sleep, interest, energy, and appetite seem to be 2/2 to her extended hospitalization since 9/13, as she associates many of these symptoms with being in the hospital and since her admission. This would not meet criteria for a depressive episode in relation to her bipolar disorder. Patient denies any recent events suspicious of a manic episode. Patient has been stable and compliant with current outpatient regimen of Depakote  1500 mg ER at nighttime.  She would like to remain on this regimen and continue to discuss additional changes with her outpatient psychiatrist.  However, given her difficulties with sleep and lack of appetite, we discussed the utilization of Mirtazapine  during this hospitalization and having close follow up with [name] her psychiatrist to further discuss these changes. she is amenable to starting additional medication.   We will continue to follow patient for the moment to assess for tolerability of newly added medication.  We will also discontinue Zyprexa  to limit potential metabolic side effects.   Diagnoses:  Active Hospital problems: Principal Problem:   Acute respiratory failure with hypoxia (HCC) Active Problems:   Bipolar disorder (HCC)   Hyperlipidemia   Essential hypertension   Chronic heart failure with preserved ejection  fraction (HCC)   Atrial fibrillation with rapid ventricular response (HCC)   CAP (community acquired pneumonia)   Constipation   Class 3 obesity   Acute metabolic encephalopathy   Pericardial effusion    Plan  ## Safety and Observation Level:  - Based on my clinical evaluation, I estimate the patient to be at no risk of self harm in the current setting  ## Medications:  -- Start Mirtazapine  7.5mg  (what medications we started the patient on) -- Discontinue olanzapine  2.5mg   ## Medical Decision Making Capacity:  Not assessed on this encounter  ## Further Work-up:  Per primary  ## Disposition:  Patient currently does not have any psychiatric contraindications to discharge once medically stable.  She does not currently meet any criteria for inpatient psychiatric hospitalization and does not meet IVC criteria.  Patient will continue to follow-up with outpatient psychiatrist once medically discharged.  ## Behavioral / Environmental:  --Routine obs   Thank you for this consult request. Recommendations have been communicated to the primary team.  We will continue to follow at this time.  NEW history  Relevant Aspects of Hospital Course:  Admitted on 09/23/2023 for acute metabolic encephalopathy and acute respiratory failure with hypoxia.  Patient Report:  On interview today, patient is unaware that psychiatry was consulted and indicates that she would prefer her bipolar disorder management be from her psychiatrist [name]. She denies SI ,HI, AVH. She relays having seen her psychiatrist [name] for years and having her bipolar disorder be well controlled on Depakote . She expresses strong preference to not have this medication changed at all since she has found it effective in treating her bipolar disorder. She  denies having symptoms that feels similar to mania or hypomania that she has experienced in the past. She denies feeling like she has with depressive episodes from her bipolar  disorder. She endorses sleep difficulty, lack of interest, low energy, and low appetite since her admission on 9/13 and specifies that it is specific to this hospitalization that those symptoms have started. She endorses having always been a picky eater and being eager to go home. She says that she told her primary team that she was having sleeping difficulties and believes that is why she was prescribed Zyprexa . On further shared decision making, she is amenable to starting Mirtazapine  7.5 mg here to improve both appetite and sleep and then having close follow up with her psychiatrist [name] to discuss this change.    Patient's ex-husband and current friend was bedside and provided additional history to the interview.  Reports that she has lost some weight (~20 pounds) recently in the setting of medical conditions.  ROS:  As above  Collateral information:  Ex spouse SHENEKIA RIESS: Patient is in room with ex spouse and gives permission for him to be present for consult. He says that he feels her eating has gotten better over the course of this admission and that she has been trying to increase her PO intake.   Psychiatric History:  Information collected from patient, EMR  Patient has been followed by [name] for at least 16 years per EMR review with well controlled bipolar disorder on her Depakote  regimen  Family psych history: unknown  Social History:  As above  Family History:  The patient's family history includes Alcohol  abuse in her maternal uncle and maternal uncle; Anxiety disorder in her mother; Arthritis in her father; Diabetes in her father; Heart attack in her father and mother; Heart failure in her brother and mother; Hypertension in her father and mother.  Medical History: Past Medical History:  Diagnosis Date   Acute hypoxemic respiratory failure due to COVID-19 [facility]) 03/01/2023   Allergy Morphine   ANEMIA 11/12/2007   Qualifier: Diagnosis of  By: Sherron CMA,  Steen     Anxiety    Arthritis    ASTHMA NOS W/ACUTE EXACERBATION 01/13/2010   Qualifier: Diagnosis of  By: Joshua MD, Debby CROME.    BIPOLAR DISORDER UNSPECIFIED 10/29/2007   Qualifier: Diagnosis of  By: Joshua MD, Debby CROME.    CHF (congestive heart failure) (HCC)    COMMON MIGRAINE 12/29/2008   Qualifier: Diagnosis of  By: Joshua MD, Debby CROME.    Encephalopathy due to infection 04/13/2023   GERD 07/28/2009   Qualifier: Diagnosis of  By: Inocencio MD, [place] LABOR    HYPERLIPIDEMIA 10/29/2007   Qualifier: Diagnosis of  By: Joshua MD, Debby CROME.    HYPERTENSION, BENIGN ESSENTIAL 10/29/2007   Qualifier: Diagnosis of  By: Joshua MD, Debby CROME.    Orbital mass, right 03/13/2023   OSTEOPENIA 10/30/2007   Qualifier: Diagnosis of  By: Joshua MD, Debby CROME.    OVERACTIVE BLADDER 02/21/2008   Qualifier: Diagnosis of  By: Norleen MD, [place] ORN    SLEEP APNEA 12/31/2009   Qualifier: Diagnosis of  By: Joshua MD, Debby CROME.    Trigger finger, left little finger 03/15/2017    Surgical History: Past Surgical History:  Procedure Laterality Date   ABDOMINAL HYSTERECTOMY     BREAST IMPLANT REMOVAL Bilateral 2022   BREAST SURGERY  01/10/1978   implants bilateral   CARPAL TUNNEL RELEASE     CHOLECYSTECTOMY  COSMETIC SURGERY     EYE SURGERY  cataract both eyes   HERNIA REPAIR     JOINT REPLACEMENT     PAROTID ENDOSCOPY     SPINE SURGERY  lower back   TUBAL LIGATION      Medications:   Current Facility-Administered Medications:    acetaminophen  (TYLENOL ) tablet 650 mg, 650 mg, Oral, Q6H PRN, 650 mg at 10/02/23 0550 **OR** acetaminophen  (TYLENOL ) suppository 650 mg, 650 mg, Rectal, Q6H PRN, Doutova, Anastassia, MD   amiodarone  (PACERONE ) tablet 200 mg, 200 mg, Oral, Daily, Doutova, Anastassia, MD, 200 mg at 10/02/23 0834   apixaban  (ELIQUIS ) tablet 5 mg, 5 mg, Oral, BID, Doutova, Anastassia, MD, 5 mg at 10/02/23 9165   atorvastatin  (LIPITOR) tablet 40 mg, 40 mg, Oral, Daily, Cesario, Yu-Ping, MD, 40 mg at  10/02/23 9165   bisacodyl  (DULCOLAX) suppository 10 mg, 10 mg, Rectal, Daily PRN, Doutova, Anastassia, MD, 10 mg at 09/25/23 1123   budesonide  (PULMICORT ) nebulizer solution 0.25 mg, 0.25 mg, Nebulization, BID, Albustami, Omar M, MD, 0.25 mg at 10/02/23 9195   diltiazem  (CARDIZEM  CD) 24 hr capsule 180 mg, 180 mg, Oral, Daily, Hochrein, James, MD, 180 mg at 10/02/23 9165   divalproex  (DEPAKOTE  ER) 24 hr tablet 1,500 mg, 1,500 mg, Oral, QHS, Rashid, Farhan, MD, 1,500 mg at 10/01/23 2109   famotidine  (PEPCID ) tablet 20 mg, 20 mg, Oral, Daily, Doutova, Anastassia, MD, 20 mg at 10/02/23 9166   fluticasone  furoate-vilanterol (BREO ELLIPTA ) 100-25 MCG/ACT 1 puff, 1 puff, Inhalation, Daily, Doutova, Anastassia, MD, 1 puff at 10/01/23 0831   furosemide  (LASIX ) tablet 40 mg, 40 mg, Oral, Daily, Patel, Pranav M, MD, 40 mg at 10/02/23 9165   guaiFENesin  (MUCINEX ) 12 hr tablet 600 mg, 600 mg, Oral, BID, Doutova, Anastassia, MD, 600 mg at 10/02/23 9165   HYDROcodone -acetaminophen  (NORCO/VICODIN) 5-325 MG per tablet 1 tablet, 1 tablet, Oral, Q4H PRN, Tobie Yetta HERO, MD, 1 tablet at 10/01/23 2108   ipratropium-albuterol  (DUONEB) 0.5-2.5 (3) MG/3ML nebulizer solution 3 mL, 3 mL, Nebulization, Q4H PRN, Albustami, Omar M, MD   lactose free nutrition (BOOST PLUS) liquid 237 mL, 237 mL, Oral, TID PC, Patel, Pranav M, MD, 237 mL at 10/02/23 0836   megestrol  (MEGACE ) 400 MG/10ML suspension 400 mg, 400 mg, Oral, BID, Patel, Pranav M, MD, 400 mg at 10/02/23 9164   metoprolol  tartrate (LOPRESSOR ) tablet 25 mg, 25 mg, Oral, BID, Cesario, Yu-Ping, MD, 25 mg at 10/02/23 9166   mirabegron  ER (MYRBETRIQ ) tablet 25 mg, 25 mg, Oral, QHS, Doutova, Anastassia, MD, 25 mg at 10/01/23 2108   mirtazapine  (REMERON ) tablet 7.5 mg, 7.5 mg, Oral, QHS, Lenard Calin, MD   multivitamin with minerals tablet 1 tablet, 1 tablet, Oral, Daily, Rashid, Farhan, MD, 1 tablet at 10/02/23 9165   ondansetron  (ZOFRAN ) tablet 4 mg, 4 mg, Oral, Q6H PRN, 4  mg at 10/01/23 1508 **OR** ondansetron  (ZOFRAN ) injection 4 mg, 4 mg, Intravenous, Q6H PRN, Doutova, Anastassia, MD, 4 mg at 10/01/23 0844   polyethylene glycol (MIRALAX  / GLYCOLAX ) packet 17 g, 17 g, Oral, Daily, Doutova, Anastassia, MD, 17 g at 09/28/23 0902   senna-docusate (Senokot-S) tablet 2 tablet, 2 tablet, Oral, QHS, Doutova, Anastassia, MD, 2 tablet at 10/01/23 2107   thiamine  (VITAMIN B1) tablet 100 mg, 100 mg, Oral, Daily, Rashid, Farhan, MD, 100 mg at 10/02/23 9165  Allergies: Allergies  Allergen Reactions   Morphine Nausea And Vomiting       Objective  Vital signs:  Temp:  [97.9 F (36.6 C)-98.9  F (37.2 C)] 97.9 F (36.6 C) (09/22 0811) Pulse Rate:  [83-101] 88 (09/22 0833) Resp:  [17-24] 24 (09/22 0833) BP: (91-129)/(60-85) 122/78 (09/22 0833) SpO2:  [91 %-95 %] 93 % (09/22 9166)  Psychiatric Specialty Exam: Physical Exam Constitutional:      Appearance: the patient is not toxic-appearing.  Pulmonary:     Effort: Pulmonary effort is normal.  Neurological:     General: No focal deficit present.     Mental Status: the patient is alert and oriented to person, place, and time.   Review of Systems  Respiratory:  Negative for shortness of breath.   Cardiovascular:  Negative for chest pain.  Gastrointestinal:  Negative for abdominal pain, constipation, diarrhea, nausea and vomiting.  Neurological:  Negative for headaches.      BP 122/78   Pulse 88   Temp 97.9 F (36.6 C) (Oral)   Resp (!) 24   Wt 106.1 kg Comment: Bed weight  SpO2 93%   BMI 40.15 kg/m   General Appearance: Fairly Groomed, elderly white lady,   Eye Contact:  Good  Speech:  Clear and Coherent  Volume:  Normal  Mood:  Euthymic  Affect:  Congruent  Thought Process:  Coherent  Orientation:  Full (Time, Place, and Person)  Thought Content: Logical   Suicidal Thoughts:  No  Homicidal Thoughts:  No  Memory:  Immediate; good  Judgement:  fair  Insight:  good   Psychomotor Activity:   Normal  Concentration:  Concentration: Good  Recall:  Good  Fund of Knowledge: Good  Language: Good  Akathisia:  No  Handed: unknown   AIMS (if indicated): not done  Assets:  Manufacturing systems engineer,  Desire for Improvement, Social Support, Good therapeutic relationship,   ADL's:  Intact  Cognition: WNL  Sleep:  Poor   Brad Prey MS3  ----------   This documentation was written with the assistance of the medical student.  I was present throughout the interview and available for further questioning in the care of this patient.  I am in agreement with the plan documented above and helped develop the plan alongside the assistance of the medical student.  Voice recognition software was used to help with the documentation.  Saray Capasso, MD [facility] Resident PGY-2

## 2023-10-02 NOTE — Progress Notes (Signed)
 [facility] Progress Note Patient: Donna Sexton FMW:[id number] DOB: May 28, 1950  DOA: 09/23/2023 DOS: the patient was seen and examined on 10/02/2023  Brief Hospital Course: 73 y.o. female with medical history significant of bipolar disorder anemia and anxiety asthma GERD, IBS, HL,D HTN, osteopenia overactive bladder, sleep apnea, prediabetes, recurrent UTIs ,paroxysmal atrial fibrillation ,chronic diastolic CHF, Presented with nausea, vomiting and altered mentation. She is being treated for a possible PNA.  Assessment & Plan: Acute metabolic encephalopathy, POA:  Most likely multifactorial secondary to dehydration, polypharmacy and possible PNA. CT head done in ED didn't show any acute abnormalities Continue to hold psychotropic medications. Repeat CT unremarkable. No focal deficit. Mentation improving.  Acute respiratory failure with hypoxia,POA:  pneumonia. Chest x-ray showing left pleural effusion and left basilar atelectasis right mild right basilar atelectasis and low lung volumes Patient is currently on anticoagulation, continue Eliquis  CT showed Consolidation/atelectasis in the lung bases left greater than right as before. Leukocytosis has resolved now. Treated for possible underlying pneumonia SLP saw the patient.  Recommended regular diet.   Atrial fibrillation with rapid ventricular response, POA: rate controlled now Continue diltiazem  p.o. with holding parameters  continue amiodarone  200 mg daily  continue Eliquis  5 mg p.o. twice daily as per cardiology recommendations Cardiology on board F/u ECHO-EF 50-55%   Bipolar disorder  Continue home medications Psychiatry consulted as patient continues with poor p.o. intake concern for possible ongoing bipolar disorder with depression issues.  Currently on Remeron  now.   Chronic heart failure with preserved ejection fraction Currently, she appears to be somewhat euvolemic   Class 3 obesity Body mass index is 40.49  kg/m.  Placing the patient at high risk for: Possibility of obesity hypoventilation   Constipation Bowel regimen ordered   Essential hypertension Prn antihypertensives   Hyperlipidemia Continue Lipitor 40 mg daily   Poor oral intake,POA:  Continues to have minimal oral intake. Marinol  was recommended although due to backorder unable to provide that medication here in the hospital. Given that the patient is already on Eliquis  Megace  will be attempted and will monitor. Palliative care consulted.  Patient has been on Megace .  Calorie count initiated.   CAP (community acquired pneumonia),possible bacterial Treated with IV Cefepime  MRSA PCR negative.   Pericardial effusion Appreciate cardiology consult ECHO showed large pericardial effusion but no tamponade. Repeat echocardiogram performed on 9/22.  Results pending.  Goals of care conversation. Palliative care was consulted. Currently switched to DNR limited.   Subjective: No nausea no vomiting no fever no chills.  Denies any other acute complaint.  Physical Exam: Clear to auscultation S1-S2 present Trace edema.  Data Reviewed: I have Reviewed nursing notes, Vitals, and Lab results. Since last encounter, pertinent lab results CBC and BMP   . I have ordered test including CBC and BMP  . I have discussed pt's care plan and test results with psychiatry and palliative care  .   Disposition: Status is: Inpatient Remains inpatient appropriate because: Monitor for improvement in oral intake  SCDs Start: 09/24/23 0152 apixaban  (ELIQUIS ) tablet 5 mg   Family Communication: Family at bedside Level of care: Progressive   Vitals:   10/02/23 0806 10/02/23 0811 10/02/23 0833 10/02/23 1443  BP:  122/78 122/78   Pulse:  99 88   Resp:  20 (!) 24   Temp:  97.9 F (36.6 C)    TempSrc:  Oral    SpO2: 93% 95% 93%   Weight:    107 kg     Author: Demarea Lorey  Tobie, MD 10/02/2023 6:47 PM  Please look on [email or web address] to find out  who is on call.

## 2023-10-02 NOTE — Consult Note (Addendum)
 Palliative Care Consult Note                                  Date: 10/02/2023   Patient Name: Donna Sexton  DOB:22-Sep-1950  FMW:[id number]  Age / Sex:73 y.o., female  PCP: Alvan Dorothyann BIRCH, MD Referring Physician: Tobie Yetta HERO, MD  Reason for Consultation: Establishing goals of care  Past Medical History:  Diagnosis Date   Acute hypoxemic respiratory failure due to COVID-19 [facility]) 03/01/2023   Allergy Morphine   ANEMIA 11/12/2007   Qualifier: Diagnosis of  By: Sherron CMA, Steen     Anxiety    Arthritis    ASTHMA NOS W/ACUTE EXACERBATION 01/13/2010   Qualifier: Diagnosis of  By: Joshua MD, Debby LITTIE.    BIPOLAR DISORDER UNSPECIFIED 10/29/2007   Qualifier: Diagnosis of  By: Joshua MD, Debby LITTIE.    CHF (congestive heart failure) (HCC)    COMMON MIGRAINE 12/29/2008   Qualifier: Diagnosis of  By: Joshua MD, Debby LITTIE.    Encephalopathy due to infection 04/13/2023   GERD 07/28/2009   Qualifier: Diagnosis of  By: Inocencio MD, [place] LABOR    HYPERLIPIDEMIA 10/29/2007   Qualifier: Diagnosis of  By: Joshua MD, Debby LITTIE.    HYPERTENSION, BENIGN ESSENTIAL 10/29/2007   Qualifier: Diagnosis of  By: Joshua MD, Debby LITTIE.    Orbital mass, right 03/13/2023   OSTEOPENIA 10/30/2007   Qualifier: Diagnosis of  By: Joshua MD, Debby LITTIE.    OVERACTIVE BLADDER 02/21/2008   Qualifier: Diagnosis of  By: Norleen MD, [place] ORN    SLEEP APNEA 12/31/2009   Qualifier: Diagnosis of  By: Joshua MD, Debby LITTIE.    Trigger finger, left little finger 03/15/2017     Assessment & Plan:   HPI/Patient Profile: 73 y.o. female  with past medical history of bipolar disorder, anxiety, GERD, IBS, recurrent UTI, HFpEF (EF 50-55%) admitted on 09/23/2023 with pneumonia. Initially presented to ED from SNF for chest pain, SOB, N/V and AMS. Of note patient was hospitalized on 8/26-9/2 for AHRF 2/2 CHF and UTI. Patient discharged to [facility] for rehab and reported doing better  until declining again leading to current hospitalization due to pneumonia now struggling poor PO intake concern for low caloric intake. Palliative care consulted for goals of care conversation.   SUMMARY OF RECOMMENDATIONS   DNR - limited Continue current management in hopes of getting stronger for rehab with eventual goal to go home and be as independent as she can be Encourage PO intake  Code Status: DNR - Limited (DNR/DNI)  Prognosis:  Unable to determine  Discharge Planning:  SNF with short term rehab   Discussed with: Tobie MD, Flores-Velasquez RN, Kennyth NP, Donna Sexton [facility])  Subjective:   Reviewed medical records, received report from team, assessed the patient and then meet at the patient's bedside to discuss diagnosis, prognosis, GOC, EOL wishes disposition and options.  Before meeting with the patient/family, I spent time reviewing the chart notes including H&P, progress notes, psychiatry notes, PT/OT, and cardiology notes. I also reviewed vital signs, nursing flowsheets, medication administrations record, labs, and imaging. Imaging reviewed include echocardiogram and CT Head.   I met with: Patient Donna Sexton (HCPOA) Patient reports that her biological children are not involved in her care and that her documented HCPOA is her ex-husband who she still has a good relationship with. Patient reports that Donna Sexton is like an adopted son to her who  has been caring for her more recently. She states that they both have equal say in her healthcare decisions if she were no longer able to make decisions for herself.    We meet to discuss diagnosis prognosis, GOC, EOL wishes, disposition and options. Concept of Palliative Care was introduced as specialized medical care for people and their families living with serious illness.  If focuses on providing relief from the symptoms and stress of a serious illness.  The goal is to improve quality of life for both the patient and the family. Values and  goals of care important to patient and family were attempted to be elicited.  Created space and opportunity for patient  and family to explore thoughts and feelings regarding current medical situation   Natural trajectory and current clinical status were discussed. Questions and concerns addressed. Patient encouraged to call with questions or concerns.    Patient/Family Understanding of Illness: - Patient and her ex-husband are aware of her current illness and recurrent hospitalization has been a problem and concern for them - Expressed frustration that she has frequent UTIs and pneumonias - Understands that her current poor PO intake is a major  Baseline Status: - Patient reports that she was fairly independent and living at home alone before February 2025 - She had helpers come in to help her with groceries and some ADLs - Most recently has been at [facility] for rehab and she reports that she was progressing well until her current admission - Has Donna (like an adopted son) and Donna Sexton Youth worker) who are involved in her life to help her   Today's Discussion: - Discussed patient's frequent hospitalization and frustration that she continues to get infections requiring admissions - Discussed patient's poor PO intake which has been a barrier to discharge to rehab, patient expressed that she has not been able to order the food she likes, states that she is a picky eater, or that she has frequent interruptions that prevent her from getting meals at the time she wants them, she is also unhappy that sometimes food trays arrive that she didn't order and it will have items that she does not like, all these problems have made it hard for her to have adequate PO intake - Patient shared that she was eating well at [facility] and she would like to discharge there sooner rather than later - She wants to get stronger and restart rehab in hopes that she can return home and be more independent -  Discussed code status with patient and she was clear she wants to be DNR and if her heart were to stop she want to pass naturally - Discussed feeding tube with patient and she clearly does not want it in place, which is a change from her advanced directives that was completed in 2011 - She shared that she just met with psychiatry before palliative met with her and she is open to trying the medication they have offered her to help stimulate her appetite  Goals: - Get to rehab and get to a point so that she can be allowed to live more independently at home again  Review of Systems  Unable to perform ROS   Objective:   Primary Diagnoses: Present on Admission:  Acute respiratory failure with hypoxia (HCC)  Acute metabolic encephalopathy  Atrial fibrillation with rapid ventricular response (HCC)  Bipolar disorder (HCC)  Chronic heart failure with preserved ejection fraction (HCC)  Class 3 obesity  Constipation  Essential hypertension  Hyperlipidemia  CAP (community acquired pneumonia)  Pericardial effusion   Vital Signs:  BP 122/78   Pulse 88   Temp 97.9 F (36.6 C) (Oral)   Resp (!) 24   Wt 106.1 kg Comment: Bed weight  SpO2 93%   BMI 40.15 kg/m   Physical Exam Constitutional:      Appearance: She is obese.  HENT:     Head: Normocephalic.  Eyes:     Extraocular Movements: Extraocular movements intact.  Pulmonary:     Effort: Pulmonary effort is normal.  Neurological:     General: No focal deficit present.     Mental Status: She is alert and oriented to person, place, and time.  Psychiatric:     Comments: Remains frustrated with her current situation    Palliative Assessment/Data: 60%   Thank you for allowing us  to participate in the care of Donna Sexton PMT will continue to support holistically.  Billing based on MDM: High  Problems Addressed: One acute or chronic illness or injury that poses a threat to life or bodily function  Amount and/or Complexity  of Data: Category 1:Review of prior external note(s) from each unique source and Assessment requiring an independent historian(s) and Category 3:Discussion of management or test interpretation with external physician/other qualified health care professional/appropriate source (not separately reported)  Risks: Decision not to resuscitate or to de-escalate care because of poor prognosis   Detailed review of medical records (labs, imaging, vital signs), medically appropriate exam, discussed with treatment team, counseling and education to patient, family, & staff, documenting clinical information, medication management, coordination of care.  Signed by: Fairy FORBES Shan DEVONNA Palliative Medicine Team  Team Phone # [phone] (Nights/Weekends)  10/02/2023, 12:24 PM

## 2023-10-02 NOTE — Progress Notes (Signed)
 PT Cancellation Note  Patient Details Name: Donna Sexton MRN: [id number] DOB: 13-Jan-1950   Cancelled Treatment:    Reason Eval/Treat Not Completed: Medical issues which prohibited therapy  Noted moderate-large pericardial effusion with plans for repeat echo. Checked to see if pt felt well enough for activity and she did not. With talking RR up to 34 with difficulty speaking. Will hold for today and check back 9/23.    Macario RAMAN, PT Acute Rehabilitation Services  Office [phone]  Macario SHAUNNA Soja 10/02/2023, 2:04 PM

## 2023-10-02 NOTE — Progress Notes (Signed)
 Progress Note  Patient Name: Donna Sexton Date of Encounter: 10/02/2023  Primary Cardiologist:   Peter Swaziland, MD   Subjective   Remains in rate-controlled afib. Noted to have moderate to large pericardial effusion. Plan for limited echo today to re-assess. Short of breath with exertion but no chest pain.  Inpatient Medications    Scheduled Meds:  amiodarone   200 mg Oral Daily   apixaban   5 mg Oral BID   atorvastatin   40 mg Oral Daily   budesonide  (PULMICORT ) nebulizer solution  0.25 mg Nebulization BID   diltiazem   180 mg Oral Daily   divalproex   1,500 mg Oral QHS   famotidine   20 mg Oral Daily   fluticasone  furoate-vilanterol  1 puff Inhalation Daily   furosemide   40 mg Oral Daily   guaiFENesin   600 mg Oral BID   lactose free nutrition  237 mL Oral TID PC   megestrol   400 mg Oral BID   metoprolol  tartrate  25 mg Oral BID   mirabegron  ER  25 mg Oral QHS   multivitamin with minerals  1 tablet Oral Daily   OLANZapine   2.5 mg Oral QHS   polyethylene glycol  17 g Oral Daily   senna-docusate  2 tablet Oral QHS   thiamine   100 mg Oral Daily   Continuous Infusions:   PRN Meds: acetaminophen  **OR** acetaminophen , bisacodyl , HYDROcodone -acetaminophen , ipratropium-albuterol , ondansetron  **OR** ondansetron  (ZOFRAN ) IV   Vital Signs    Vitals:   10/02/23 0525 10/02/23 0806 10/02/23 0811 10/02/23 0833  BP: 129/85  122/78 122/78  Pulse: (!) 101  99 88  Resp: (!) 21  20 (!) 24  Temp: 97.9 F (36.6 C)  97.9 F (36.6 C)   TempSrc: Oral  Oral   SpO2: 94% 93% 95% 93%  Weight:        Intake/Output Summary (Last 24 hours) at 10/02/2023 0929 Last data filed at 10/02/2023 9447 Gross per 24 hour  Intake 600 ml  Output 900 ml  Net -300 ml   Filed Weights   09/25/23 1141  Weight: 106.1 kg    Telemetry    Atrial fib with rate increased  - Personally Reviewed  ECG    NA - Personally Reviewed  Physical Exam   GEN: No  acute distress.   Neck: No  JVD Cardiac:  irregularly irregular, no murmur  Respiratory:     Decreased breath sounds at the bases.  GI: Soft, nontender, non-distended, normal bowel sounds  MS:  trivial LE edema; No deformity. Neuro:   Nonfocal  Psych: Oriented and appropriate    Labs    Chemistry Recent Labs  Lab 09/30/23 0715 10/01/23 0434 10/02/23 0604  NA 134* 133* 131*  K 3.5 3.7 4.7  CL 98 97* 96*  CO2 26 29 24   GLUCOSE 93 107* 93  BUN 8 5* 6*  CREATININE 0.50 0.69 0.50  CALCIUM  8.8* 8.9 9.0  GFRNONAA >60 >60 >60  ANIONGAP 10 7 11      Hematology Recent Labs  Lab 09/30/23 0715 10/01/23 0434 10/02/23 0604  WBC 7.4 8.3 9.3  RBC 3.00* 2.93* 3.12*  HGB 10.1* 9.8* 10.3*  HCT 29.0* 28.1* 30.4*  MCV 96.7 95.9 97.4  MCH 33.7 33.4 33.0  MCHC 34.8 34.9 33.9  RDW 13.2 13.4 13.6  PLT 209 204 267    Cardiac EnzymesNo results for input(s): TROPONINI in the last 168 hours. No results for input(s): TROPIPOC in the last 168 hours.   BNP No results  for input(s): BNP, PROBNP in the last 168 hours.    DDimer No results for input(s): DDIMER in the last 168 hours.   Radiology    No results found.   Cardiac Studies   Echo:   1. Left ventricular ejection fraction, by estimation, is 50 to 55%. The  left ventricle has low normal function. The left ventricle has no regional  wall motion abnormalities. Left ventricular diastolic function could not  be evaluated.   2. Right ventricular systolic function is normal. The right ventricular  size is normal.   3. Left atrial size was mild to moderately dilated.   4. Large pericardial effusion. Moderate pleural effusion in the left  lateral region.   5. The mitral valve is normal in structure. No evidence of mitral valve  regurgitation.   6. The aortic valve is normal in structure. Aortic valve regurgitation is  not visualized.   7. Aortic dilatation noted. There is dilatation of the ascending aorta,  measuring 38 mm.   8. The inferior vena cava is  normal in size with <50% respiratory  variability, suggesting right atrial pressure of 8 mmHg.   9. No overt echocardiographic signs of tamponade.   Patient Profile     73 y.o. female with medical history significant of bipolar disorder, hypertension, OSA, paroxysmal atrial fibrillation, chronic diastolic heart failure who presented with acute metabolic encephalopathy we are consulted for atrial fibrillation.  She presented with nausea/vomiting and confusion.  Noted to be in A-fib with heart rates in the 100s.   Assessment & Plan    PAF:  History of PAF now persistent with acute illness.  Rate controlled.  Cardizem  dose increased this admission. She is on low dose beta blocker.   Pericardial effusion:  Pericardial effusion was read as large on the official echo. I have reviewed these images and thought this to be moderate with no tamponade physiology.  Will plan on repeat limited echo today.  Metabolic encephalopathy:  Thought to be multifactorial with recent infection, meds, comorbid conditions.  Improved    Chronic diastolic HF:  Continue oral diuretics.  Work to [facility] discharge per hospital medicine.  For questions or updates, please contact [facility] Please consult [email or web address] for contact info under Cardiology/STEMI.   Vinie KYM Maxcy, MD, [facility], FNLA, FACP  [place]  [facility]  Medical Director of the [facility] Lipid Disorders &  Cardiovascular Risk Reduction Clinic Diplomate of the American Board of Clinical Lipidology Attending Cardiologist  Direct Dial: [phone]  Fax: [phone]  Website:  [email or web address]  Vinie JAYSON Maxcy, MD  10/02/2023, 9:29 AM

## 2023-10-03 ENCOUNTER — Other Ambulatory Visit: Payer: Self-pay | Admitting: Cardiology

## 2023-10-03 DIAGNOSIS — F319 Bipolar disorder, unspecified: Secondary | ICD-10-CM | POA: Diagnosis not present

## 2023-10-03 DIAGNOSIS — Z515 Encounter for palliative care: Secondary | ICD-10-CM | POA: Diagnosis not present

## 2023-10-03 DIAGNOSIS — Z66 Do not resuscitate: Secondary | ICD-10-CM | POA: Diagnosis not present

## 2023-10-03 DIAGNOSIS — J9601 Acute respiratory failure with hypoxia: Secondary | ICD-10-CM | POA: Diagnosis not present

## 2023-10-03 DIAGNOSIS — I3139 Other pericardial effusion (noninflammatory): Secondary | ICD-10-CM

## 2023-10-03 MED ORDER — ONDANSETRON HCL 4 MG/2ML IJ SOLN
4.0000 mg | Freq: Once | INTRAMUSCULAR | Status: AC
  Administered 2023-10-03: 4 mg via INTRAVENOUS
  Filled 2023-10-03: qty 2

## 2023-10-03 NOTE — Progress Notes (Signed)
 Daily Progress Note   Date: 10/03/2023   Patient Name: Donna Sexton  DOB: [birth year]  MRN: [id number]  Age / Sex: 73 y.o., female  Attending Physician: Tobie Yetta HERO, MD Primary Care Physician: Alvan Dorothyann BIRCH, MD Admit Date: 09/23/2023 Length of Stay: 10 days  Reason for Follow-up: Establishing goals of care  Past Medical History:  Diagnosis Date  . Acute hypoxemic respiratory failure due to COVID-19 (HCC) 03/01/2023  . Allergy Morphine  . ANEMIA 11/12/2007   Qualifier: Diagnosis of  By: Sherron CMA, Steen    . Anxiety   . Arthritis   . ASTHMA NOS W/ACUTE EXACERBATION 01/13/2010   Qualifier: Diagnosis of  By: Joshua MD, Debby LITTIE.   . BIPOLAR DISORDER UNSPECIFIED 10/29/2007   Qualifier: Diagnosis of  By: Joshua MD, Debby LITTIE.   . CHF (congestive heart failure) (HCC)   . COMMON MIGRAINE 12/29/2008   Qualifier: Diagnosis of  By: Joshua MD, Debby LITTIE.   . Encephalopathy due to infection 04/13/2023  . GERD 07/28/2009   Qualifier: Diagnosis of  By: Inocencio MD, [place] DELENA QUAY HYPERLIPIDEMIA 10/29/2007   Qualifier: Diagnosis of  By: Joshua MD, Debby LITTIE.   . HYPERTENSION, BENIGN ESSENTIAL 10/29/2007   Qualifier: Diagnosis of  By: Joshua MD, Debby LITTIE.   . Orbital mass, right 03/13/2023  . OSTEOPENIA 10/30/2007   Qualifier: Diagnosis of  By: Joshua MD, Debby LITTIE.   . OVERACTIVE BLADDER 02/21/2008   Qualifier: Diagnosis of  By: Norleen MD, [place] ORN   . SLEEP APNEA 12/31/2009   Qualifier: Diagnosis of  By: Joshua MD, Debby LITTIE.   . Trigger finger, left little finger 03/15/2017    Assessment & Plan:   HPI/Patient Profile:  73 y.o. female  with past medical history of bipolar disorder, anxiety, GERD, IBS, recurrent UTI, HFpEF (EF 50-55%) admitted on 09/23/2023 with pneumonia. Initially presented to ED from SNF for chest pain, SOB, N/V and AMS. Of note patient was hospitalized on 8/26-9/2 for AHRF 2/2 CHF and UTI. Patient discharged to [facility] for rehab and reported doing better until  declining again leading to current hospitalization due to pneumonia now struggling poor PO intake concern for low caloric intake. Palliative care consulted for goals of care conversation.   - Provided patient with chocolate boost which is her preferred flavor and encouraged PO  SUMMARY OF RECOMMENDATIONS   DNR - limited Continue current management in hopes of getting stronger for rehab with eventual goal to go home and be as independent as she can be Encourage PO intake  Code Status: DNR - Limited (DNR/DNI)  Prognosis: Unable to determine  Discharge Planning: SNF with short term rehab  Discussed with: Tobie MD, Thelbert PEAK, Kennyth NP, Lorene [facility])  Subjective:   Subjective: Chart Reviewed. Updates received. Patient Assessed. Created space and opportunity for patient  and family to explore thoughts and feelings regarding current medical situation.  Today's Discussion: Today before meeting with the patient/family, I reviewed the chart notes including cardiology progress notes. I also reviewed vital signs, nursing flowsheets, medication administrations record, labs, and imaging. Labs/imaging reviewed include echocardiogram. - Spoke with patient about possible discharge, encouraged PO, goals are clear that she does not want a feeding tube - Educated patient on her ECHO on 9/22 which showed improved pericardial effusion from large to moderate and that the plan is to continue to follow up with outpatient in 1 month  Review of Systems  Constitutional:  Positive for appetite change.    Objective:  Primary Diagnoses: Present on Admission: . Acute respiratory failure with hypoxia (HCC) . Acute metabolic encephalopathy . Atrial fibrillation with rapid ventricular response (HCC) . Bipolar disorder (HCC) . Chronic heart failure with preserved ejection fraction (HCC) . Class 3 obesity . Constipation . Essential hypertension . Hyperlipidemia . CAP (community acquired pneumonia) .  Pericardial effusion   Vital Signs:  BP 130/80   Pulse (!) 103   Temp 98.4 F (36.9 C) (Oral)   Resp (!) 23   Wt 107 kg   SpO2 93%   BMI 40.49 kg/m   Physical Exam Constitutional:      Appearance: She is obese.  HENT:     Head: Normocephalic.  Eyes:     Extraocular Movements: Extraocular movements intact.  Pulmonary:     Effort: Pulmonary effort is normal.  Neurological:     Mental Status: She is oriented to person, place, and time.  Psychiatric:     Comments: Flat affect     Palliative Assessment/Data: 60%   Existing Vynca/ACP Documentation: HCPOA and AD from 2011, pending scanning into EMR  Thank you for allowing us  to participate in the care of Donna Sexton PMT will continue to support holistically.  Time Total: 35 minutes  Detailed review of medical records (labs, imaging, vital signs), medically appropriate exam, discussed with treatment team, counseling and education to patient, family, & staff, documenting clinical information, medication management, coordination of care  Fairy FORBES Shan DEVONNA  Palliative Medicine Team  Team Phone # [phone] (Nights/Weekends) 10/03/2023 9:51 AM

## 2023-10-03 NOTE — Progress Notes (Signed)
   Repeat limited echo yesterday shows stable moderate-sized pericardial effusion without tamponade features. She denies any symptoms of pericarditis. Would recommend repeat outpatient echo in 1 month and follow-up afterwards, which we will arrange.  [facility] will sign off.   Medication Recommendations:  as above Other recommendations (labs, testing, etc):  outpatient echo in 1 month Follow up as an outpatient:  Dr. Swaziland or APP  Vinie KYM Maxcy, MD, [facility], FNLA, FACP  [place]  [facility]  Medical Director of the Advanced Lipid Disorders &  Cardiovascular Risk Reduction Clinic Diplomate of the American Board of Clinical Lipidology Attending Cardiologist  Direct Dial: [phone]  Fax: [phone]  Website:  [email or web address]

## 2023-10-03 NOTE — Progress Notes (Signed)
 Occupational Therapy Treatment Patient Details Name: Donna Sexton MRN: [id number] DOB: 09-22-1950 Today's Date: 10/03/2023   History of present illness Pt is a 73 y.o. female presenting 9/13 with chest pain, confusion, shortness of breath, N/V. Admitted for acute respiratory failure with hypoxia, metabolic encephalopathy. CT reveals Left pleural effusion and left basilar atelectasis.  Recent hospitalization 8/26-9/2 for UTI/sepsis. Echo moderate pleural effusion, no tamponade PMH: bipolar disorder, anemia, anxiety, asthma GERD, IBS, HLD, HTN, osteopenia, overactive bladder, sleep apnea, prediabetes, recurrent UTIs, paroxysmal atrial fibrillation, chronic diastolic CHF   OT comments  Patient despite nausea, participated very well.  Able to progress to sit to stand Min A of 2 with the use of a Stedy and transition to the recliner.  OT to continue efforts in the acute setting to address deficits and Patient will benefit from continued inpatient follow up therapy, <3 hours/day.        If plan is discharge home, recommend the following:  Two people to help with walking and/or transfers;Two people to help with bathing/dressing/bathroom;Assistance with feeding;Direct supervision/assist for medications management;Direct supervision/assist for financial management;Assist for transportation;Help with stairs or ramp for entrance;Supervision due to cognitive status   Equipment Recommendations  None recommended by OT    Recommendations for Other Services      Precautions / Restrictions Precautions Precautions: Fall Recall of Precautions/Restrictions: Impaired Restrictions Weight Bearing Restrictions Per Provider Order: No       Mobility Bed Mobility Overal bed mobility: Needs Assistance Bed Mobility: Sidelying to Sit   Sidelying to sit: Min assist, +2 for physical assistance, Used rails, HOB elevated            Transfers Overall transfer level: Needs assistance Equipment used: 1  person hand held assist Transfers: Sit to/from Stand, Bed to chair/wheelchair/BSC Sit to Stand: From elevated surface, Min assist, +2 physical assistance, +2 safety/equipment             Transfer via Lift Equipment: Stedy   Balance Overall balance assessment: Needs assistance Sitting-balance support: Feet supported, No upper extremity supported Sitting balance-Leahy Scale: Fair     Standing balance support: Bilateral upper extremity supported, Reliant on assistive device for balance Standing balance-Leahy Scale: Poor Standing balance comment: CGA in stedy                           ADL either performed or assessed with clinical judgement   ADL   Eating/Feeding: Minimal assistance;Sitting   Grooming: Sitting;Minimal assistance   Upper Body Bathing: Maximal assistance;Sitting   Lower Body Bathing: Maximal assistance;Sitting/lateral leans   Upper Body Dressing : Maximal assistance;Sitting   Lower Body Dressing: Maximal assistance;Sitting/lateral leans                      Extremity/Trunk Assessment Upper Extremity Assessment Upper Extremity Assessment: Generalized weakness   Lower Extremity Assessment Lower Extremity Assessment: Defer to PT evaluation        Vision Patient Visual Report: No change from baseline     Perception Perception Perception: Not tested   Praxis Praxis Praxis: Not tested   Communication Communication Communication: No apparent difficulties Factors Affecting Communication: Difficulty expressing self   Cognition Arousal: Alert Behavior During Therapy: Flat affect Cognition: No apparent impairments                               Following commands: Intact  Cueing   Cueing Techniques: Verbal cues, Tactile cues  Exercises      Shoulder Instructions       General Comments BP RUE reading low, pt denied dizziness; RN switched to LUE with normal BP; sats 93% on RA    Pertinent Vitals/  Pain       Pain Assessment Pain Assessment: Faces Faces Pain Scale: No hurt Pain Intervention(s): Monitored during session                                                          Frequency  Min 2X/week        Progress Toward Goals  OT Goals(current goals can now be found in the care plan section)  Progress towards OT goals: Progressing toward goals  Acute Rehab OT Goals OT Goal Formulation: With patient Time For Goal Achievement: 10/08/23 Potential to Achieve Goals: Fair  Plan      Co-evaluation    PT/OT/SLP Co-Evaluation/Treatment: Yes Reason for Co-Treatment: For patient/therapist safety;To address functional/ADL transfers;Other (comment) PT goals addressed during session: Mobility/safety with mobility;Balance;Proper use of DME OT goals addressed during session: ADL's and self-care      AM-PAC OT 6 Clicks Daily Activity     Outcome Measure   Help from another person eating meals?: A Little Help from another person taking care of personal grooming?: A Little Help from another person toileting, which includes using toliet, bedpan, or urinal?: Total Help from another person bathing (including washing, rinsing, drying)?: A Lot Help from another person to put on and taking off regular upper body clothing?: A Lot Help from another person to put on and taking off regular lower body clothing?: A Lot 6 Click Score: 13    End of Session    OT Visit Diagnosis: Muscle weakness (generalized) (M62.81);Other abnormalities of gait and mobility (R26.89);Unsteadiness on feet (R26.81);Other symptoms and signs involving cognitive function   Activity Tolerance     Patient Left in chair;with call bell/phone within reach;with chair alarm set   Nurse Communication Mobility status        Time: 1200-1230 OT Time Calculation (min): 30 min  Charges: OT General Charges $OT Visit: 1 Visit OT Treatments $Therapeutic Activity: 8-22  mins  10/03/2023  RP, OTR/L  Acute Rehabilitation Services  Office:  [phone]   Donna Sexton 10/03/2023, 1:10 PM

## 2023-10-03 NOTE — Progress Notes (Signed)
 Ordering outpatient echocardiogram, to be completed in ~1 month to evaluate pericardial effusion. Dr. Swaziland to read

## 2023-10-03 NOTE — TOC Progression Note (Signed)
 Transition of Care [facility]) - Progression Note    Patient Details  Name: Donna Sexton MRN: [id number] Date of Birth: March 08, 1950  Transition of Care [facility]) CM/SW Contact  Montie LOISE Louder, [place] Phone Number: 10/03/2023, 10:17 AM  Clinical Narrative:     Per MD- patient stable to return to SNF- [facility] confirmed bed availability , will start insurance authorization once patient has been seen by PT.  Montie Louder, MSW, LCSW Clinical Social Worker    Expected Discharge Plan: Skilled Nursing Facility Barriers to Discharge: Insurance Authorization, Continued Medical Work up               Expected Discharge Plan and Services In-house Referral: Clinical Social Work     Living arrangements for the past 2 months: Skilled Nursing Facility                                       Social Drivers of Health (SDOH) Interventions SDOH Screenings   Food Insecurity: Patient Unable To Answer (09/24/2023)  Recent Concern: Food Insecurity - Food Insecurity Present (06/26/2023)  Housing: Unknown (09/24/2023)  Transportation Needs: Patient Unable To Answer (09/24/2023)  Utilities: Patient Unable To Answer (09/24/2023)  Alcohol  Screen: Low Risk  (03/28/2022)  Depression (PHQ2-9): Medium Risk (06/26/2023)  Financial Resource Strain: Low Risk  (06/26/2023)  Physical Activity: Inactive (06/26/2023)  Social Connections: Unknown (09/24/2023)  Recent Concern: Social Connections - Socially Isolated (09/05/2023)  Stress: Stress Concern Present (06/26/2023)  Tobacco Use: Low Risk  (09/23/2023)    Readmission Risk Interventions    09/09/2023   12:46 PM  Readmission Risk Prevention Plan  Transportation Screening Complete  PCP or Specialist Appt within 3-5 Days Complete  HRI or Home Care Consult Complete  Social Work Consult for Recovery Care Planning/Counseling Complete  Palliative Care Screening Not Applicable  Medication Review Oceanographer) Complete

## 2023-10-03 NOTE — TOC Progression Note (Signed)
 Transition of Care [facility]) - Progression Note    Patient Details  Name: Donna Sexton MRN: [id number] Date of Birth: 1950/11/24  Transition of Care [facility]) CM/SW Contact  Montie LOISE Louder, [place] Phone Number: 10/03/2023, 1:39 PM  Clinical Narrative:     Insurance authorization pending reference Mayme Barrows ID# [id number]    Expected Discharge Plan: Skilled Nursing Facility Barriers to Discharge: Insurance Authorization, Continued Medical Work up               Expected Discharge Plan and Services In-house Referral: Clinical Social Work     Living arrangements for the past 2 months: Skilled Nursing Facility                                       Social Drivers of Health (SDOH) Interventions SDOH Screenings   Food Insecurity: Patient Unable To Answer (09/24/2023)  Recent Concern: Food Insecurity - Food Insecurity Present (06/26/2023)  Housing: Unknown (09/24/2023)  Transportation Needs: Patient Unable To Answer (09/24/2023)  Utilities: Patient Unable To Answer (09/24/2023)  Alcohol  Screen: Low Risk  (03/28/2022)  Depression (PHQ2-9): Medium Risk (06/26/2023)  Financial Resource Strain: Low Risk  (06/26/2023)  Physical Activity: Inactive (06/26/2023)  Social Connections: Unknown (09/24/2023)  Recent Concern: Social Connections - Socially Isolated (09/05/2023)  Stress: Stress Concern Present (06/26/2023)  Tobacco Use: Low Risk  (09/23/2023)    Readmission Risk Interventions    09/09/2023   12:46 PM  Readmission Risk Prevention Plan  Transportation Screening Complete  PCP or Specialist Appt within 3-5 Days Complete  HRI or Home Care Consult Complete  Social Work Consult for Recovery Care Planning/Counseling Complete  Palliative Care Screening Not Applicable  Medication Review Oceanographer) Complete

## 2023-10-03 NOTE — Consult Note (Signed)
 [facility] Face-to-Face Follow-up Psychiatric Evaluation   Service Date: October 03, 2023 LOS:  LOS: 10 days    Assessment   Donna Sexton is a 73 y.o. female admitted medically on 09/23/2023  2:20 PM for acute metabolic encephalopathy and acute respiratory failure with hypoxia.  Psychiatric history is significant bipolar disorder and has a past medical history of asthma, GERD, IBS, HLD, HTN, CHF, paroxysmal a fib. Psychiatry was consulted for lack of PO intake and concern for possibly worsening depression 2/2  bipolar disorder and assistance with medication management by primary team.   On interview today, patient is attempting increase her PO intake. Denies any side effects since starting Mirtazapine  last night. Denies depressive or anxiety symptoms. Denies SI/HI/AVH. Patient will continue to utilize Mirtazapine  during this hospitalization and having close follow up with [name] (primary psychiatrist). Patient does not require inpatient psychiatric treatment and will follow-up with outpatient provider.  We will sign off.  Diagnoses:  Active Hospital problems: Principal Problem:   Acute respiratory failure with hypoxia (HCC) Active Problems:   Bipolar disorder (HCC)   Hyperlipidemia   Essential hypertension   Chronic heart failure with preserved ejection fraction (HCC)   Atrial fibrillation with rapid ventricular response (HCC)   CAP (community acquired pneumonia)   Constipation   Class 3 obesity   Acute metabolic encephalopathy   Pericardial effusion   DNR (do not resuscitate)   Palliative care by specialist   Mild protein-calorie malnutrition    Plan  ## Safety and Observation Level:  - Based on my clinical evaluation, I estimate the patient to be at no risk of self harm in the current setting  ## Medications:  -- Continue Mirtazapine  7.5mg  to address appetite, sleep  ## Medical Decision Making Capacity:  Not assessed on this encounter  ##  Further Work-up:  Per primary  ## Disposition:  Patient currently does not have any psychiatric contraindications to discharge once medically stable.  She does not currently meet any criteria for inpatient psychiatric hospitalization and does not meet IVC criteria.  Patient will continue to follow-up with outpatient psychiatrist once medically discharged.  ## Behavioral / Environmental:  --Routine obs  Thank you for this consult request. Recommendations have been communicated to the primary team.  We will sign off at this time.  History  Relevant Aspects of Hospital Course:  Admitted on 09/23/2023 for acute metabolic encephalopathy and acute respiratory failure with hypoxia.  Patient Report:  10/02/2023   On interview today, patient is unaware that psychiatry was consulted and indicates that she would prefer her bipolar disorder management be from her psychiatrist [name]. She denies SI ,HI, AVH. She relays having seen her psychiatrist [name] for years and having her bipolar disorder be well controlled on Depakote . She expresses strong preference to not have this medication changed at all since she has found it effective in treating her bipolar disorder. She denies having symptoms that feels similar to mania or hypomania that she has experienced in the past. She denies feeling like she has with depressive episodes from her bipolar disorder. She endorses sleep difficulty, lack of interest, low energy, and low appetite since her admission on 9/13 and specifies that it is specific to this hospitalization that those symptoms have started. She endorses having always been a picky eater and being eager to go home. She says that she told her primary team that she was having sleeping difficulties and believes that is why she was prescribed Zyprexa . On  further shared decision making, she is amenable to starting Mirtazapine  7.5 mg here to improve both appetite and sleep and then having close follow up  with her psychiatrist [name] to discuss this change.    Patient's ex-husband and current friend was bedside and provided additional history to the interview.  Reports that she has lost some weight (~20 pounds) recently in the setting of medical conditions.  10/03/2023 On interview today, patient was evaluated at the grandparents.  Patient denies any negative side effects from starting mirtazapine  last night.  He reports mood symptoms are currently stable and denies suicidal ideation, homicidal ideation or auditory hallucination.  On medication for now we will discuss with further changes with her outpatient psychiatrist once medically discharged.  Patient is hopeful to be discharged soon and notes frustrations with being hospitalized frequently due to medical conditions.   ROS:  As above Collateral information:  Ex spouse Donna Sexton: Patient is in room with ex spouse and gives permission for him to be present for consult. He says that he feels her eating has gotten better over the course of this admission and that she has been trying to increase her PO intake. He is aware of the plan for her to follow-up in outpatient setting.   Psychiatric History:  Information collected from patient, EMR  Patient has been followed by [name] for at least 16 years per EMR review with well controlled bipolar disorder on her Depakote  regimen  Family psych history: unknown  Social History:  As above  Family History:  The patient's family history includes Alcohol  abuse in her maternal uncle and maternal uncle; Anxiety disorder in her mother; Arthritis in her father; Diabetes in her father; Heart attack in her father and mother; Heart failure in her brother and mother; Hypertension in her father and mother.  Medical History: Past Medical History:  Diagnosis Date   Acute hypoxemic respiratory failure due to COVID-19 [facility]) 03/01/2023   Allergy Morphine   ANEMIA 11/12/2007   Qualifier: Diagnosis of   By: Donna Sexton     Anxiety    Arthritis    ASTHMA NOS W/ACUTE EXACERBATION 01/13/2010   Qualifier: Diagnosis of  By: Donna Sexton, Donna Sexton.    BIPOLAR DISORDER UNSPECIFIED 10/29/2007   Qualifier: Diagnosis of  By: Donna Sexton, Donna Sexton.    CHF (congestive heart failure) (HCC)    COMMON MIGRAINE 12/29/2008   Qualifier: Diagnosis of  By: Donna Sexton, Donna Sexton.    Encephalopathy due to infection 04/13/2023   GERD 07/28/2009   Qualifier: Diagnosis of  By: Inocencio Sexton, [place] LABOR    HYPERLIPIDEMIA 10/29/2007   Qualifier: Diagnosis of  By: Donna Sexton, Donna Sexton.    HYPERTENSION, BENIGN ESSENTIAL 10/29/2007   Qualifier: Diagnosis of  By: Donna Sexton, Donna Sexton.    Orbital mass, right 03/13/2023   OSTEOPENIA 10/30/2007   Qualifier: Diagnosis of  By: Donna Sexton, Donna Sexton.    OVERACTIVE BLADDER 02/21/2008   Qualifier: Diagnosis of  By: Norleen Sexton, [place] ORN    SLEEP APNEA 12/31/2009   Qualifier: Diagnosis of  By: Donna Sexton, Donna Sexton.    Trigger finger, left little finger 03/15/2017    Surgical History: Past Surgical History:  Procedure Laterality Date   ABDOMINAL HYSTERECTOMY     BREAST IMPLANT REMOVAL Bilateral 2022   BREAST SURGERY  01/10/1978   implants bilateral   CARPAL TUNNEL RELEASE     CHOLECYSTECTOMY     COSMETIC SURGERY  EYE SURGERY  cataract both eyes   HERNIA REPAIR     JOINT REPLACEMENT     PAROTID ENDOSCOPY     SPINE SURGERY  lower back   TUBAL LIGATION      Medications:   Current Facility-Administered Medications:    acetaminophen  (TYLENOL ) tablet 650 mg, 650 mg, Oral, Q6H PRN, 650 mg at 10/03/23 0450 **OR** acetaminophen  (TYLENOL ) suppository 650 mg, 650 mg, Rectal, Q6H PRN, Doutova, Anastassia, Sexton   amiodarone  (PACERONE ) tablet 200 mg, 200 mg, Oral, Daily, Doutova, Anastassia, Sexton, 200 mg at 10/02/23 9165   apixaban  (ELIQUIS ) tablet 5 mg, 5 mg, Oral, BID, Doutova, Anastassia, Sexton, 5 mg at 10/02/23 2106   atorvastatin  (LIPITOR) tablet 40 mg, 40 mg, Oral, Daily, Cesario,  Yu-Ping, Sexton, 40 mg at 10/02/23 9165   bisacodyl  (DULCOLAX) suppository 10 mg, 10 mg, Rectal, Daily PRN, Doutova, Anastassia, Sexton, 10 mg at 09/25/23 1123   budesonide  (PULMICORT ) nebulizer solution 0.25 mg, 0.25 mg, Nebulization, BID, Albustami, Omar M, Sexton, 0.25 mg at 10/02/23 2209   diltiazem  (CARDIZEM  CD) 24 hr capsule 180 mg, 180 mg, Oral, Daily, Hochrein, James, Sexton, 180 mg at 10/02/23 9165   divalproex  (DEPAKOTE  ER) 24 hr tablet 1,500 mg, 1,500 mg, Oral, QHS, Rashid, Farhan, Sexton, 1,500 mg at 10/02/23 2107   famotidine  (PEPCID ) tablet 20 mg, 20 mg, Oral, Daily, Doutova, Anastassia, Sexton, 20 mg at 10/02/23 9166   fluticasone  furoate-vilanterol (BREO ELLIPTA ) 100-25 MCG/ACT 1 puff, 1 puff, Inhalation, Daily, Doutova, Anastassia, Sexton, 1 puff at 10/01/23 0831   furosemide  (LASIX ) tablet 40 mg, 40 mg, Oral, Daily, Patel, Pranav M, Sexton, 40 mg at 10/02/23 9165   guaiFENesin  (MUCINEX ) 12 hr tablet 600 mg, 600 mg, Oral, BID, Doutova, Anastassia, Sexton, 600 mg at 10/02/23 2106   HYDROcodone -acetaminophen  (NORCO/VICODIN) 5-325 MG per tablet 1 tablet, 1 tablet, Oral, Q4H PRN, Tobie Yetta HERO, Sexton, 1 tablet at 10/02/23 2058   ipratropium-albuterol  (DUONEB) 0.5-2.5 (3) MG/3ML nebulizer solution 3 mL, 3 mL, Nebulization, Q4H PRN, Albustami, Omar M, Sexton   lactose free nutrition (BOOST PLUS) liquid 237 mL, 237 mL, Oral, TID PC, Patel, Pranav M, Sexton, 237 mL at 10/02/23 0836   megestrol  (MEGACE ) 400 MG/10ML suspension 400 mg, 400 mg, Oral, BID, Patel, Pranav M, Sexton, 400 mg at 10/02/23 2106   metoprolol  tartrate (LOPRESSOR ) tablet 25 mg, 25 mg, Oral, BID, Cesario, Yu-Ping, Sexton, 25 mg at 10/02/23 2105   mirabegron  ER (MYRBETRIQ ) tablet 25 mg, 25 mg, Oral, QHS, Doutova, Anastassia, Sexton, 25 mg at 10/02/23 2106   mirtazapine  (REMERON ) tablet 7.5 mg, 7.5 mg, Oral, QHS, Lenard Calin, Sexton, 7.5 mg at 10/02/23 2106   multivitamin with minerals tablet 1 tablet, 1 tablet, Oral, Daily, Rashid, Farhan, Sexton, 1 tablet at 10/02/23 9165   ondansetron   (ZOFRAN ) tablet 4 mg, 4 mg, Oral, Q6H PRN, 4 mg at 10/02/23 2106 **OR** ondansetron  (ZOFRAN ) injection 4 mg, 4 mg, Intravenous, Q6H PRN, Doutova, Anastassia, Sexton, 4 mg at 10/01/23 0844   polyethylene glycol (MIRALAX  / GLYCOLAX ) packet 17 g, 17 g, Oral, Daily, Doutova, Anastassia, Sexton, 17 g at 09/28/23 0902   senna-docusate (Senokot-S) tablet 2 tablet, 2 tablet, Oral, QHS, Doutova, Anastassia, Sexton, 2 tablet at 10/02/23 2106   thiamine  (VITAMIN B1) tablet 100 mg, 100 mg, Oral, Daily, Rashid, Farhan, Sexton, 100 mg at 10/02/23 9165  Allergies: Allergies  Allergen Reactions   Morphine Nausea And Vomiting       Objective  Vital signs:  Temp:  [98.5 F (36.9 C)-98.9 F (  37.2 C)] 98.5 F (36.9 C) (09/23 0347) Pulse Rate:  [83-105] 90 (09/23 0347) Resp:  [19-31] 31 (09/23 0347) BP: (113-131)/(72-101) 131/101 (09/23 0347) SpO2:  [92 %-96 %] 92 % (09/23 0347) Weight:  [892 kg] 107 kg (09/22 1443)  Psychiatric Specialty Exam: Physical Exam Constitutional:      Appearance: the patient is not toxic-appearing.  Pulmonary:     Effort: Pulmonary effort is normal.  Neurological:     General: No focal deficit present.     Mental Status: the patient is alert and oriented to person, place, and time.   Review of Systems  Respiratory:  Negative for shortness of breath.   Cardiovascular:  Negative for chest pain.  Gastrointestinal:  Negative for abdominal pain, constipation, diarrhea, nausea and vomiting.  Neurological:  Negative for headaches.      BP (!) 131/101 (BP Location: Right Arm)   Pulse 90   Temp 98.5 F (36.9 C) (Axillary)   Resp (!) 31   Wt 107 kg   SpO2 92%   BMI 40.49 kg/m   General Appearance: Fairly Groomed, elderly white lady,   Eye Contact:  Good  Speech:  Clear and Coherent  Volume:  Normal  Mood:  Euthymic  Affect:  Congruent  Thought Process:  Coherent  Orientation:  Full (Time, Place, and Person)  Thought Content: Logical   Suicidal Thoughts:  No  Homicidal  Thoughts:  No  Memory:  Immediate; good  Judgement: fair  Insight:  good   Psychomotor Activity:  Normal  Concentration:  Concentration: Good  Recall:  Good  Fund of Knowledge: Good  Language: Good  Akathisia:  No  Handed: unknown   AIMS (if indicated): not done  Assets:  Manufacturing systems engineer,  Desire for Improvement, Social Support, Good therapeutic relationship,   ADL's:  Intact  Cognition: WNL  Sleep:  Fair   PATTI OLDEN, Sexton [facility] Psychiatry Resident PGY-2

## 2023-10-03 NOTE — Progress Notes (Signed)
 [facility] Progress Note Patient: Donna Sexton FMW:[id number] DOB: 11-10-1950  DOA: 09/23/2023 DOS: the patient was seen and examined on 10/03/2023  Brief Hospital Course: 73 y.o. female with medical history significant of bipolar disorder anemia and anxiety asthma GERD, IBS, HL,D HTN, osteopenia overactive bladder, sleep apnea, prediabetes, recurrent UTIs ,paroxysmal atrial fibrillation ,chronic diastolic CHF, Presented with nausea, vomiting and altered mentation. She is being treated for a possible PNA.  Assessment & Plan: Acute metabolic encephalopathy, POA:  Most likely multifactorial secondary to dehydration, polypharmacy and possible PNA. CT head done in ED didn't show any acute abnormalities Continue to hold psychotropic medications. Repeat CT unremarkable. No focal deficit. Mentation improving.  Acute respiratory failure with hypoxia,POA:  pneumonia. Chest x-ray showing left pleural effusion and left basilar atelectasis right mild right basilar atelectasis and low lung volumes Patient is currently on anticoagulation, continue Eliquis  CT showed Consolidation/atelectasis in the lung bases left greater than right as before. Leukocytosis has resolved now. Treated for possible underlying pneumonia SLP saw the patient.  Recommended regular diet.   Atrial fibrillation with rapid ventricular response, POA: rate controlled now Continue diltiazem  p.o. with holding parameters  continue amiodarone  200 mg daily  continue Eliquis  5 mg p.o. twice daily as per cardiology recommendations Cardiology on board F/u ECHO-EF 50-55%   Bipolar disorder  Continue home medications Psychiatry consulted as patient continues with poor p.o. intake concern for possible ongoing bipolar disorder with depression issues.  Currently on Remeron  now.   Chronic heart failure with preserved ejection fraction Currently, she appears to be somewhat euvolemic   Class 3 obesity Body mass index is 40.49  kg/m.  Placing the patient at high risk for: Possibility of obesity hypoventilation   Constipation Bowel regimen ordered   Essential hypertension Prn antihypertensives   Hyperlipidemia Continue Lipitor 40 mg daily   Poor oral intake,POA:  Continues to have minimal oral intake. Marinol  was recommended although due to backorder unable to provide that medication here in the hospital. Given that the patient is already on Eliquis  Megace  will be attempted and will monitor. Palliative care consulted.  Megace  was started although patient is unable to tolerated due to nausea. Will discontinue the medication. On Remeron . Calorie count initiated.   CAP (community acquired pneumonia),possible bacterial Treated with IV Cefepime  MRSA PCR negative.   Pericardial effusion Appreciate cardiology consult ECHO showed large pericardial effusion but no tamponade. Repeat echocardiogram performed on 9/22.  Shows improvement in effusion. Repeat echocardiogram in 1 month reported by cardiology.  Goals of care conversation. Palliative care was consulted. Currently switched to DNR limited. Patient does not want feeding tube. Currently medically stable.  As outcomes of calorie count of will not change management.   Subjective: No nausea or vomiting at the time of my evaluation but later in the day reported multiple episodes of nausea with RN.  Denies any abdominal pain.  No fever no chills.  Still with minimal oral intake here.  Physical Exam: Clear to auscultation percussion S1-S2 present Trace edema Bowel sound present.  Nontender.  Data Reviewed: I have Reviewed nursing notes, Vitals, and Lab results. Since last encounter, pertinent lab results CBC and BMP   . I have ordered test including CBC and BMP  .  Discussed with palliative care.  As well as psychiatry.  Disposition: Status is: Inpatient Remains inpatient appropriate because: Medically stable.  Awaiting placement to SNF.  SCDs  Start: 09/24/23 0152 apixaban  (ELIQUIS ) tablet 5 mg   Family Communication: Husband at bedside.  Discussed  with son on the phone as well. Level of care: Progressive   Vitals:   10/03/23 0800 10/03/23 0845 10/03/23 1106 10/03/23 1909  BP: 121/84 130/80 112/74 (!) 99/56  Pulse: 100 (!) 103 100 (!) 122  Resp: (!) 25 (!) 23 (!) 34 (!) 23  Temp: 98.4 F (36.9 C)   99.2 F (37.3 C)  TempSrc: Oral   Oral  SpO2: 92% 93% 92% 91%  Weight:         Author: Yetta Blanch, MD 10/03/2023 8:19 PM  Please look on [email or web address] to find out who is on call.

## 2023-10-03 NOTE — Progress Notes (Signed)
 Physical Therapy Treatment Patient Details Name: Donna Sexton MRN: [id number] DOB: 01/10/1951 Today's Date: 10/03/2023   History of Present Illness Pt is a 73 y.o. female presenting 9/13 with chest pain, confusion, shortness of breath, N/V. Admitted for acute respiratory failure with hypoxia, metabolic encephalopathy. CT reveals Left pleural effusion and left basilar atelectasis.  Recent hospitalization 8/26-9/2 for UTI/sepsis. Echo moderate pleural effusion, no tamponade PMH: bipolar disorder, anemia, anxiety, asthma GERD, IBS, HLD, HTN, osteopenia, overactive bladder, sleep apnea, prediabetes, recurrent UTIs, paroxysmal atrial fibrillation, chronic diastolic CHF    PT Comments  Patient able to get to side of bed with +2 min assist with incr time for processing instructions. Immediately began dry heaving for several minutes. RN in to assess and provide nausea meds. Patient able to maintain sitting balance at EOB for at least 10 minutes with CGA while this occurred. Pt wanting to get OOB and able to stand with stedy +2 min assist (full standing/upright to allow stedy flaps to be placed behind her). Stood from stedy +2 min assist and slowly lowered herself into recliner. Patient pleased to be OOB and hopes to regain ability to walk.     If plan is discharge home, recommend the following: Two people to help with walking and/or transfers;Two people to help with bathing/dressing/bathroom;Assistance with cooking/housework;Assistance with feeding;Direct supervision/assist for medications management;Direct supervision/assist for financial management;Assist for transportation;Help with stairs or ramp for entrance;Supervision due to cognitive status   Can travel by private vehicle     No  Equipment Recommendations  Hospital bed    Recommendations for Other Services       Precautions / Restrictions Precautions Precautions: Fall Recall of Precautions/Restrictions: Impaired Restrictions Weight  Bearing Restrictions Per Provider Order: No     Mobility  Bed Mobility Overal bed mobility: Needs Assistance Bed Mobility: Rolling, Sidelying to Sit Rolling: Min assist, Used rails Sidelying to sit: Min assist, +2 for physical assistance, Used rails, HOB elevated       General bed mobility comments: increased time, step by step verbal cues for sequencing, assist with trunk and BLE, use of bed pad to scoot to EOB    Transfers Overall transfer level: Needs assistance   Transfers: Sit to/from Stand Sit to Stand: From elevated surface, Min assist, +2 physical assistance, +2 safety/equipment           General transfer comment: sit to stand in stedy from elevated bed. Able to immediately stand tall enough for stedy flaps to be placed for sitting. Stood from stedy +2 min assist Transfer via Financial trader: Stedy  Ambulation/Gait               General Gait Details: unable   Comptroller Bed    Modified Rankin (Stroke Patients Only)       Balance Overall balance assessment: Needs assistance Sitting-balance support: Feet supported, No upper extremity supported Sitting balance-Leahy Scale: Fair Sitting balance - Comments: EOB with dry heaving, checking BP, and RN in to give nausea meds (~10 minutes)   Standing balance support: Bilateral upper extremity supported, Reliant on assistive device for balance Standing balance-Leahy Scale: Poor Standing balance comment: CGA in stedy                            Communication Communication Communication: Impaired Factors Affecting Communication: Difficulty expressing self  Cognition Arousal: Alert  Behavior During Therapy: Flat affect   PT - Cognitive impairments: Initiation, Sequencing, Problem solving                       PT - Cognition Comments: more alert and engaged today Following commands: Impaired Following commands impaired: Follows one step  commands with increased time    Cueing Cueing Techniques: Verbal cues, Tactile cues  Exercises General Exercises - Lower Extremity Heel Slides: AROM, Both, 5 reps    General Comments General comments (skin integrity, edema, etc.): BP RUE reading low, pt denied dizziness; RN switched to LUE with normal BP; sats 93% on RA      Pertinent Vitals/Pain Pain Assessment Pain Assessment: No/denies pain Faces Pain Scale: No hurt    Home Living                          Prior Function            PT Goals (current goals can now be found in the care plan section) Acute Rehab PT Goals Patient Stated Goal: wants to get back to [place] and then home Time For Goal Achievement: 10/08/23 Potential to Achieve Goals: Fair Progress towards PT goals: Progressing toward goals    Frequency    Min 2X/week      PT Plan      Co-evaluation PT/OT/SLP Co-Evaluation/Treatment: Yes Reason for Co-Treatment: For patient/therapist safety;To address functional/ADL transfers;Other (comment) (pt limited reserves) PT goals addressed during session: Mobility/safety with mobility;Balance;Proper use of DME        AM-PAC PT 6 Clicks Mobility   Outcome Measure  Help needed turning from your back to your side while in a flat bed without using bedrails?: A Little Help needed moving from lying on your back to sitting on the side of a flat bed without using bedrails?: Total Help needed moving to and from a bed to a chair (including a wheelchair)?: Total Help needed standing up from a chair using your arms (e.g., wheelchair or bedside chair)?: Total Help needed to walk in hospital room?: Total Help needed climbing 3-5 steps with a railing? : Total 6 Click Score: 8    End of Session   Activity Tolerance: Treatment limited secondary to medical complications (Comment) (nausea; dry heaves) Patient left: in chair;with call bell/phone within reach Nurse Communication: Mobility status;Need for  lift equipment PT Visit Diagnosis: Unsteadiness on feet (R26.81);Muscle weakness (generalized) (M62.81);Difficulty in walking, not elsewhere classified (R26.2)     Time: 8844-8774 PT Time Calculation (min) (ACUTE ONLY): 30 min  Charges:    $Therapeutic Activity: 8-22 mins PT General Charges $$ ACUTE PT VISIT: 1 Visit                      Donna Sexton, PT Acute Rehabilitation Services  Office [phone]    Donna Sexton 10/03/2023, 12:36 PM

## 2023-10-03 NOTE — Plan of Care (Signed)
  Problem: Activity: Goal: Ability to tolerate increased activity will improve Outcome: Progressing   Problem: Clinical Measurements: Goal: Ability to maintain a body temperature in the normal range will improve Outcome: Progressing   Problem: Respiratory: Goal: Ability to maintain adequate ventilation will improve Outcome: Progressing Goal: Ability to maintain a clear airway will improve Outcome: Progressing   Problem: Education: Goal: Knowledge of General Education information will improve Description: Including pain rating scale, medication(s)/side effects and non-pharmacologic comfort measures Outcome: Progressing   

## 2023-10-03 NOTE — Progress Notes (Signed)
 Calorie Count Note- Day 1  48 hour calorie count ordered.  Diet: regular, thin liquids Supplements: Boost Plus TID, Mighty Shake TID  Estimated Nutritional Needs:  Kcal:  1900-2100 kcal Protein:  110-130 gm Fluid:  >1.9L/day  Breakfast: 150 kcal and 0g protein Lunch: 412 kcal and 3g protein  Dinner: none documented Supplements: 535 kcal and 17g protein (1 Boost plus documented and half an ensure reported per nursing)  Total intake: 1097 kcal (58% of minimum estimated needs)  20 g protein (18% of minimum estimated needs)  NUTRITION DIAGNOSIS:  Inadequate oral intake related to acute illness as evidenced by meal completion < 25%.  GOAL:  Patient will meet greater than or equal to 90% of their needs  INTERVENTION:  Encourage PO intake - Diet advanced to Regular with thin liquids per SLP recommendations  Room service with assist  Nursing to assist with meals   Boost Plus TID after meals, each supplement provides 360 kcal, 14 gm protein Mighty Shake TID with meals, each supplement provides 330 kcals and 9 grams of protein MVI with minerals daily 100 mg Thiamine  daily  If intake does not improve recommend cortrak placement  48 hour calorie count   Royce Maris, RDN, LDN Clinical Nutrition See AMiON for contact information.

## 2023-10-04 ENCOUNTER — Inpatient Hospital Stay (HOSPITAL_COMMUNITY)

## 2023-10-04 DIAGNOSIS — Z7189 Other specified counseling: Secondary | ICD-10-CM | POA: Diagnosis not present

## 2023-10-04 DIAGNOSIS — J90 Pleural effusion, not elsewhere classified: Secondary | ICD-10-CM

## 2023-10-04 DIAGNOSIS — Z515 Encounter for palliative care: Secondary | ICD-10-CM

## 2023-10-04 DIAGNOSIS — Z66 Do not resuscitate: Secondary | ICD-10-CM

## 2023-10-04 DIAGNOSIS — J9601 Acute respiratory failure with hypoxia: Secondary | ICD-10-CM | POA: Diagnosis not present

## 2023-10-04 DIAGNOSIS — G9340 Encephalopathy, unspecified: Secondary | ICD-10-CM

## 2023-10-04 DIAGNOSIS — N179 Acute kidney failure, unspecified: Secondary | ICD-10-CM

## 2023-10-04 DIAGNOSIS — I5032 Chronic diastolic (congestive) heart failure: Secondary | ICD-10-CM | POA: Diagnosis not present

## 2023-10-04 DIAGNOSIS — R579 Shock, unspecified: Secondary | ICD-10-CM | POA: Diagnosis not present

## 2023-10-04 DIAGNOSIS — I4891 Unspecified atrial fibrillation: Secondary | ICD-10-CM | POA: Diagnosis not present

## 2023-10-04 DIAGNOSIS — I3139 Other pericardial effusion (noninflammatory): Secondary | ICD-10-CM | POA: Diagnosis not present

## 2023-10-04 DIAGNOSIS — G9341 Metabolic encephalopathy: Secondary | ICD-10-CM | POA: Diagnosis not present

## 2023-10-04 LAB — CBC WITH DIFFERENTIAL/PLATELET
Abs Immature Granulocytes: 0.4 K/uL — ABNORMAL HIGH (ref 0.00–0.07)
Basophils Absolute: 0.1 K/uL (ref 0.0–0.1)
Basophils Relative: 1 %
Eosinophils Absolute: 0 K/uL (ref 0.0–0.5)
Eosinophils Relative: 0 %
HCT: 35.4 % — ABNORMAL LOW (ref 36.0–46.0)
Hemoglobin: 12 g/dL (ref 12.0–15.0)
Immature Granulocytes: 2 %
Lymphocytes Relative: 8 %
Lymphs Abs: 1.5 K/uL (ref 0.7–4.0)
MCH: 33.1 pg (ref 26.0–34.0)
MCHC: 33.9 g/dL (ref 30.0–36.0)
MCV: 97.8 fL (ref 80.0–100.0)
Monocytes Absolute: 3 K/uL — ABNORMAL HIGH (ref 0.1–1.0)
Monocytes Relative: 15 %
Neutro Abs: 14.7 K/uL — ABNORMAL HIGH (ref 1.7–7.7)
Neutrophils Relative %: 74 %
Platelets: 597 K/uL — ABNORMAL HIGH (ref 150–400)
RBC: 3.62 MIL/uL — ABNORMAL LOW (ref 3.87–5.11)
RDW: 14.1 % (ref 11.5–15.5)
WBC: 19.7 K/uL — ABNORMAL HIGH (ref 4.0–10.5)
nRBC: 0 % (ref 0.0–0.2)

## 2023-10-04 LAB — BLOOD GAS, ARTERIAL
Acid-base deficit: 0.9 mmol/L (ref 0.0–2.0)
Bicarbonate: 21.3 mmol/L (ref 20.0–28.0)
O2 Saturation: 100 %
Patient temperature: 37
pCO2 arterial: 28 mmHg — ABNORMAL LOW (ref 32–48)
pH, Arterial: 7.49 — ABNORMAL HIGH (ref 7.35–7.45)
pO2, Arterial: 308 mmHg — ABNORMAL HIGH (ref 83–108)

## 2023-10-04 LAB — BASIC METABOLIC PANEL WITH GFR
Anion gap: 16 — ABNORMAL HIGH (ref 5–15)
BUN: 16 mg/dL (ref 8–23)
CO2: 18 mmol/L — ABNORMAL LOW (ref 22–32)
Calcium: 8.9 mg/dL (ref 8.9–10.3)
Chloride: 94 mmol/L — ABNORMAL LOW (ref 98–111)
Creatinine, Ser: 1.3 mg/dL — ABNORMAL HIGH (ref 0.44–1.00)
GFR, Estimated: 43 mL/min — ABNORMAL LOW (ref >60)
Glucose, Bld: 138 mg/dL — ABNORMAL HIGH (ref 70–99)
Potassium: 4.9 mmol/L (ref 3.5–5.1)
Sodium: 128 mmol/L — ABNORMAL LOW (ref 135–145)

## 2023-10-04 LAB — COMPREHENSIVE METABOLIC PANEL WITH GFR
ALT: 15 U/L (ref 0–44)
AST: 23 U/L (ref 15–41)
Albumin: 2.5 g/dL — ABNORMAL LOW (ref 3.5–5.0)
Alkaline Phosphatase: 73 U/L (ref 38–126)
Anion gap: 14 (ref 5–15)
BUN: 16 mg/dL (ref 8–23)
CO2: 20 mmol/L — ABNORMAL LOW (ref 22–32)
Calcium: 9 mg/dL (ref 8.9–10.3)
Chloride: 94 mmol/L — ABNORMAL LOW (ref 98–111)
Creatinine, Ser: 1.42 mg/dL — ABNORMAL HIGH (ref 0.44–1.00)
GFR, Estimated: 39 mL/min — ABNORMAL LOW (ref >60)
Glucose, Bld: 153 mg/dL — ABNORMAL HIGH (ref 70–99)
Potassium: 5.2 mmol/L — ABNORMAL HIGH (ref 3.5–5.1)
Sodium: 128 mmol/L — ABNORMAL LOW (ref 135–145)
Total Bilirubin: 1.1 mg/dL (ref 0.0–1.2)
Total Protein: 5.8 g/dL — ABNORMAL LOW (ref 6.5–8.1)

## 2023-10-04 LAB — GLUCOSE, CAPILLARY: Glucose-Capillary: 150 mg/dL — ABNORMAL HIGH (ref 70–99)

## 2023-10-04 LAB — LACTIC ACID, PLASMA: Lactic Acid, Venous: 3.8 mmol/L (ref 0.5–1.9)

## 2023-10-04 LAB — BRAIN NATRIURETIC PEPTIDE: B Natriuretic Peptide: 351.6 pg/mL — ABNORMAL HIGH (ref 0.0–100.0)

## 2023-10-04 MED ORDER — HYDROMORPHONE HCL 1 MG/ML IJ SOLN
0.5000 mg | INTRAMUSCULAR | Status: DC | PRN
  Administered 2023-10-04: 1 mg via INTRAVENOUS
  Filled 2023-10-04: qty 1

## 2023-10-04 MED ORDER — PANTOPRAZOLE SODIUM 40 MG IV SOLR
40.0000 mg | Freq: Two times a day (BID) | INTRAVENOUS | Status: DC
Start: 2023-10-04 — End: 2023-10-05
  Administered 2023-10-04: 40 mg via INTRAVENOUS
  Filled 2023-10-04: qty 10

## 2023-10-04 MED ORDER — HALOPERIDOL LACTATE 5 MG/ML IJ SOLN
2.0000 mg | Freq: Four times a day (QID) | INTRAMUSCULAR | Status: DC | PRN
  Administered 2023-10-04: 2 mg via INTRAVENOUS
  Filled 2023-10-04: qty 1

## 2023-10-04 MED ORDER — METOPROLOL TARTRATE 5 MG/5ML IV SOLN
2.5000 mg | Freq: Once | INTRAVENOUS | Status: AC
  Administered 2023-10-04: 2.5 mg via INTRAVENOUS
  Filled 2023-10-04: qty 5

## 2023-10-04 MED ORDER — BIOTENE DRY MOUTH MT LIQD: 15.0000 mL | OROMUCOSAL | Status: DC | PRN

## 2023-10-04 MED ORDER — ALUM & MAG HYDROXIDE-SIMETH 200-200-20 MG/5ML PO SUSP
15.0000 mL | ORAL | Status: DC | PRN
  Administered 2023-10-04: 15 mL via ORAL
  Filled 2023-10-04: qty 30

## 2023-10-04 MED ORDER — SODIUM CHLORIDE 0.9 % IV SOLN
12.5000 mg | Freq: Once | INTRAVENOUS | Status: AC
  Administered 2023-10-04: 12.5 mg via INTRAVENOUS
  Filled 2023-10-04: qty 12.5

## 2023-10-04 MED ORDER — GLYCOPYRROLATE 0.2 MG/ML IJ SOLN
0.4000 mg | INTRAMUSCULAR | Status: DC | PRN
Start: 2023-10-04 — End: 2023-10-05

## 2023-10-04 MED ORDER — HYDROMORPHONE HCL 1 MG/ML IJ SOLN: 0.5000 mg | INTRAMUSCULAR | Status: DC | PRN

## 2023-10-04 MED ORDER — LACTATED RINGERS IV BOLUS
250.0000 mL | Freq: Once | INTRAVENOUS | Status: AC
Start: 2023-10-04 — End: 2023-10-04
  Administered 2023-10-04: 250 mL via INTRAVENOUS

## 2023-10-04 MED ORDER — METOCLOPRAMIDE HCL 5 MG/ML IJ SOLN
5.0000 mg | Freq: Four times a day (QID) | INTRAMUSCULAR | Status: DC
  Administered 2023-10-04: 5 mg via INTRAVENOUS
  Filled 2023-10-04: qty 2

## 2023-10-04 MED ORDER — LORAZEPAM 2 MG/ML IJ SOLN: 1.0000 mg | INTRAMUSCULAR | Status: DC | PRN

## 2023-10-04 MED ORDER — POLYVINYL ALCOHOL 1.4 % OP SOLN: 1.0000 [drp] | Freq: Four times a day (QID) | OPHTHALMIC | Status: DC | PRN

## 2023-10-11 NOTE — Progress Notes (Signed)
 Notified son, Fonda Sluder, of mother's passing over the phone since they did not choose to return after dinner.  Awaiting funeral home arrangements , son will speak with dad and Fonda will call me back with info.

## 2023-10-11 NOTE — Progress Notes (Signed)
 [facility]  PROGRESS NOTE  Donna Sexton FMW:[id number] DOB: [birth year] DOA: 09/23/2023 PCP: Alvan Dorothyann BIRCH, MD   Brief HPI:   73 y.o. female with medical history significant of bipolar disorder anemia and anxiety asthma GERD, IBS, HL,D HTN, osteopenia overactive bladder, sleep apnea, prediabetes, recurrent UTIs ,paroxysmal atrial fibrillation ,chronic diastolic CHF, Presented with nausea, vomiting and altered mentation. She is being treated for a possible PNA.     Assessment/Plan:   Hypotension/?  Cardiogenic shock - Patient became hypotensive this morning with heart rate in 130s.  She was supposed to get amiodarone , metoprolol  and Cardizem  which was held due to low blood pressure.  Blood pressure improved temporarily after she received LR to 50 cc bolus x 1.  Patient continues to be hypotensive. - Patient has moderate pericardial effusion, cardiology was consulted with concern for cardiac tamponade. - Patient's son and her ex-husband at bedside, had discussion with cardiology regarding putting pericardial drain. - Palliative care also consulted for goals of care discussion. - At this time family decided to pursue comfort measures and avoid any aggressive interventions.    Acute metabolic encephalopathy, POA:  Most likely multifactorial secondary to dehydration, polypharmacy and possible PNA. CT head done in ED didn't show any acute abnormalities Continue to hold psychotropic medications. Repeat CT unremarkable. No focal deficit.  Acute hypoxemic respiratory failure -Patient's oxygen saturations dropped after she received IV fluid bolus.  Stat x-ray showed worsening of left pleural effusion.  PCCM was consulted for urgent thoracentesis however PCCM was reluctant to do thoracentesis given that patient was hypotensive with moderate-sized pericardial effusion.  Cardiology was consulted for urgent pericardiocentesis.  Earlier patient was treated for possible pneumonia.  She  was treated with IV cefepime .    Large left pleural effusion Stat chest x-ray obtained today showed large left pleural effusion.  PCCM was consulted for urgent thoracentesis.  They were electing to do thoracentesis considering large pericardial effusion with hypotension and possibility of cardiogenic shock. At this time patient is comfort measures only.  No aggressive interventions desired per family.  Poor p.o. intake/nausea - Patient has been having poor p.o. intake/nausea, she was started on Reglan  5 mg IV every 6 hours, Protonix  40 mg IV twice daily this morning. - CBG was checked was 150 Poor oral intake,POA:  Continues to have minimal oral intake. Marinol  was recommended although due to backorder unable to provide that medication here in the hospital. Given that the patient is already on Eliquis  Megace  will be attempted and will monitor. Palliative care consulted.  Megace  was started although patient is unable to tolerated due to nausea. Will discontinue the medication. On Remeron . Calorie count initiated.  Paroxysmal atrial fibrillation RVR - Patient is supposed to be on amiodarone  200 mg daily, Cardizem  180 mg daily, metoprolol  25 mg daily.  These medications were held due to hypotension this morning.  Heart rate in 120s to 130s.  Patient is lethargic.  Hypertension - Antibiotics medications on hold  Hyperlipidemia - Patient on Lipitor 40 mg daily    Medications     amiodarone   200 mg Oral Daily   apixaban   5 mg Oral BID   atorvastatin   40 mg Oral Daily   budesonide  (PULMICORT ) nebulizer solution  0.25 mg Nebulization BID   diltiazem   180 mg Oral Daily   divalproex   1,500 mg Oral QHS   famotidine   20 mg Oral Daily   fluticasone  furoate-vilanterol  1 puff Inhalation Daily   furosemide   40 mg Oral Daily  guaiFENesin   600 mg Oral BID   lactose free nutrition  237 mL Oral TID PC   metoprolol  tartrate  25 mg Oral BID   mirabegron  ER  25 mg Oral QHS   mirtazapine   7.5  mg Oral QHS   multivitamin with minerals  1 tablet Oral Daily   polyethylene glycol  17 g Oral Daily   senna-docusate  2 tablet Oral QHS   thiamine   100 mg Oral Daily     Data Reviewed:   CBG:  No results for input(s): GLUCAP in the last 168 hours.  SpO2: 95 % O2 Flow Rate (L/min): 2 L/min FiO2 (%): 21 %    Vitals:   10/03/23 2316 2023-11-02 0309 11/02/2023 0813 2023/11/02 0822  BP: 107/82 (!) 105/90  109/74  Pulse: 96 94  (!) 109  Resp: (!) 28 (!) 31  (!) 25  Temp: 98.6 F (37 C) 99 F (37.2 C)  97.8 F (36.6 C)  TempSrc: Oral Oral  Oral  SpO2: 92% 92% 92% 95%  Weight:          Data Reviewed:  Basic Metabolic Panel: Recent Labs  Lab 09/28/23 0551 09/29/23 0536 09/30/23 0715 10/01/23 0434 10/02/23 0604  NA 134* 131* 134* 133* 131*  K 3.5 3.7 3.5 3.7 4.7  CL 97* 100 98 97* 96*  CO2 25 26 26 29 24   GLUCOSE 90 96 93 107* 93  BUN 13 10 8  5* 6*  CREATININE 0.50 0.52 0.50 0.69 0.50  CALCIUM  9.0 8.7* 8.8* 8.9 9.0  MG 1.8 1.6* 1.7 1.6* 1.8    CBC: Recent Labs  Lab 09/28/23 0551 09/29/23 0536 09/30/23 0715 10/01/23 0434 10/02/23 0604  WBC 6.2 6.3 7.4 8.3 9.3  HGB 9.9* 10.5* 10.1* 9.8* 10.3*  HCT 29.7* 31.1* 29.0* 28.1* 30.4*  MCV 100.0 98.4 96.7 95.9 97.4  PLT 151 179 209 204 267    LFT No results for input(s): AST, ALT, ALKPHOS, BILITOT, PROT, ALBUMIN in the last 168 hours.   Antibiotics: Anti-infectives (From admission, onward)    Start     Dose/Rate Route Frequency Ordered Stop   09/24/23 2200  vancomycin  (VANCOREADY) IVPB 1250 mg/250 mL  Status:  Discontinued        1,250 mg 166.7 mL/hr over 90 Minutes Intravenous Every 24 hours 09/23/23 2047 09/24/23 1250   09/23/23 2045  ceFEPIme  (MAXIPIME ) 2 g in sodium chloride  0.9 % 100 mL IVPB  Status:  Discontinued        2 g 200 mL/hr over 30 Minutes Intravenous Every 8 hours 09/23/23 2044 09/28/23 1102   09/23/23 2045  vancomycin  (VANCOREADY) IVPB 1500 mg/300 mL        1,500 mg 150 mL/hr  over 120 Minutes Intravenous  Once 09/23/23 2044 09/24/23 0011        DVT prophylaxis: Apixaban   Code Status: DNR   Family Communication: Discussed with patient's ex husband at bedside, patient is HCPOA   CONSULTS    Subjective   Called by RN that patient's blood pressure dropped with heart rate in 130s.  She was given a bolus of LR at 250 cc x 1 earlier with improvement of blood pressure.  Patient respiratory status got worse, rapid response was called.  She was requiring 100% oxygen nonrebreather.  BiPAP was placed.   Objective    Physical Examination:   Appears lethargic, pale appearing, barely responding to verbal stimuli S1-S2, regular Lungs decreased breath sounds bilaterally Neuro-lethargic, barely opens eyes to verbal stimuli  Status is: Inpatient:             Sabas GORMAN Brod   [facility] If 7PM-7AM, please contact night-coverage at [email or web address], Office  [phone]   October 26, 2023, 8:37 AM  LOS: 11 days

## 2023-10-11 NOTE — Progress Notes (Signed)
 Pt continues to c/o nausea despite doses of Zofran . She states the nausea has not improved at all since last dose of Zofran . Dr Drusilla notified see new order.

## 2023-10-11 NOTE — Progress Notes (Signed)
 2 RN's pronounced patient at 19:55.  Tatum Corl,RN and Zobie Orozco,RN

## 2023-10-11 NOTE — Progress Notes (Addendum)
 Palliative:  HPI:  73 y.o. female  with past medical history of bipolar disorder, anxiety, GERD, IBS, recurrent UTI, HFpEF (EF 50-55%) admitted on 09/23/2023 with pneumonia. Initially presented to ED from SNF for chest pain, SOB, N/V and AMS. Of note patient was hospitalized on 8/26-9/2 for AHRF 2/2 CHF and UTI. Patient discharged to [facility] for rehab and reported doing better until declining again leading to current hospitalization due to pneumonia now struggling poor PO intake concern for low caloric intake. Palliative care consulted for goals of care conversation.   I met today at Donna Sexton's bedside at the end of meeting with MARYLAND Maffucci and Josh along with Dr. Drusilla, Dr. Wonda, Ronnald Gave NP and RN. Ms. Hickle has had a significant decline with concern for cardiac tamponade. Family at bedside faced with difficult decisions. They were provided time alone to discuss further. I assisted RN to take off BiPAP as Ms. Genna was nauseated and dry heaving.   Family emerged and have opted to forego pericardiocentesis/thoracentesis and would like to focus on keeping her comfortable and minimize suffering. This is consistent with her previous stated wishes and aligns with what she has shared over the past few days with my team. Prognosis poor. Family accepting.   All questions/concerns addressed. Emotional support provided. Discussed symptom management with RN.   Exam: Minimally responsive. Significant respiratory distress. Severe hypotension. Abd soft. Dry heaving.   Plan: - DNR/DNI already in place - No plans for invasive intervention - Focus on comfort and symptom management - Low threshold for dilaudid  infusion  - Anticipate hospital death  45 min  Bernarda Kitty, NP Palliative Medicine Team Pager [phone] (Please see [email or web address] for schedule) Team Phone [phone]

## 2023-10-11 NOTE — Progress Notes (Signed)
 Calorie Count Note  No meal tickets available yesterday for calorie count. Pt very lethargic today and unable to provide recall. Pt reports she has been vomiting all this morning. 0% for breakfast today. Per GOC, pt is adamant she does not want a feeding tube.    48 hour calorie count ordered.   Diet: regular, thin liquids Supplements: Boost Plus TID, Mighty Shake TID   Estimated Nutritional Needs:  Kcal:  1900-2100 kcal Protein:  110-130 gm Fluid:  >1.9L/day   Day 1, 9/22: Breakfast: 150 kcal and 0g protein Lunch: 412 kcal and 3g protein  Dinner: none documented Supplements: 535 kcal and 17g protein (1 Boost plus documented and half an ensure reported per nursing)  Total intake: 1097 kcal (58% of minimum estimated needs)  20 g protein (18% of minimum estimated needs)   Day 2, 9/23:  Breakfast: No meals documented  Lunch: No meals documented   Dinner: No meals documented  Supplements:  360 kcal, 20 gm protein (1 Boost Plus documented)  Total intake: 360 kcal (19% of minimum estimated needs)  20 g protein (18% of minimum estimated needs)  Estimated Nutritional Needs:  Kcal:  1900-2100 kcal Protein:  110-130 gm Fluid:  >1.9L/day   NUTRITION DIAGNOSIS:  Inadequate oral intake related to acute illness as evidenced by meal completion < 25%.   GOAL:  Patient will meet greater than or equal to 90% of their needs   INTERVENTION:  Encourage PO intake - Diet advanced to Regular with thin liquids per SLP recommendations  Room service with assist  Nursing to assist with meals   Boost Plus TID after meals, each supplement provides 360 kcal, 14 gm protein Mighty Shake TID with meals, each supplement provides 330 kcals and 9 grams of protein MVI with minerals daily 100 mg Thiamine  daily  If intake does not improve recommend cortrak placement if within GOC  Olivia Kenning, RD Registered Dietitian  See Amion for more information

## 2023-10-11 NOTE — Progress Notes (Signed)
   2023/10/13 1523  Adult Ventilator Settings  Vent Type Servo i  Vent Mode BIPAP  Set Rate 18 bmp  FiO2 (%) 100 %  IPAP 16 cmH20  EPAP 6 cmH20  Pressure Support 10 cmH20  PEEP 6 cmH20     ABG obtained and pt placed on BIPAP per MD LAMA.

## 2023-10-11 NOTE — Progress Notes (Signed)
   Oct 17, 2023 1636  Vitals  ECG Heart Rate (!) 156  Oxygen Therapy  O2 Device Nasal Cannula  O2 Flow Rate (L/min) 6 L/min   Removed bipap due to vomiting, oral suction provided. MD made aware.

## 2023-10-11 NOTE — Progress Notes (Signed)
 PT Cancellation Note  Patient Details Name: Donna Sexton MRN: [id number] DOB: 04/05/1950   Cancelled Treatment:    Reason Eval/Treat Not Completed: Patient not medically ready  Noted pt with persistent nausea and BP 79/50. Not currently appropriate for PT. Will follow.    Macario RAMAN, PT Acute Rehabilitation Services  Office [phone]  Macario SHAUNNA Soja 02-Nov-2023, 11:02 AM

## 2023-10-11 NOTE — Progress Notes (Signed)
 Met w family again after son Sidra arrived, along w cardiology [facility] CCM attending and PCM   Discussed GOC in detail - both what aggressive interventions would entail, vs consideration of transitioning to comfort focussed care.  After some time to discuss amongst themselves, family has reached a decision to transition to comfort care.   PCCM will sign off Please let us  know if we can be of further assistance.   Ronnald Gave MSN, AGACNP-BC [facility] Pulmonary/Critical Care Medicine 10/23/23, 4:49 PM

## 2023-10-11 NOTE — Significant Event (Signed)
 Rapid Response Event Note   Reason for Call :  Lethargic, tachypneic 40s,   Initial Focused Assessment:  Arrived to room to find patient somnolent, open eyes briefly to voice, though able to answer questions A&Ox4 and follow all commands. Shallow and tachypneic respirations, 45-50. Some dry heaving upon first entering. Per report, pt last emesis last night.   112/93 (100) HR 120s-130s RR 40-50 O2 100% NRB 15L  Interventions/Plan of Care:  NRB placed briefly  CXR Metoprolol  2.5mg  IV ABG 7.49/28/308 Bipap CCM consult, to bedside Cardiology to bedside Bedside US    Pt sustained hypotension s/p metoprolol  IV and bipap placememt. CCM and cardiology to bedside. GOC discussion took place with options. Ultimately decision made to move forward with comfort.   Event Summary:  MD Notified: JUDITHANN Brod MD Call Time: 77 Arrival Time: 1510 End Time:   Tonna Chiquita POUR, RN

## 2023-10-11 NOTE — Progress Notes (Signed)
   10/24/23 1334  Urine Measurement/Characteristics  Urinary Interventions Bladder scan (Pt has not voided this shift)  Bladder Scan Volume (mL) 40 mL   Dr. Drusilla made aware. See new orders.

## 2023-10-11 NOTE — Progress Notes (Signed)
   10/30/2023 1005  Assess: MEWS Score  Temp (!) 97.5 F (36.4 C)  BP (!) 79/50  MAP (mmHg) (!) 60  Pulse Rate 100  ECG Heart Rate (!) 119  Resp (!) 39  SpO2 96 %  O2 Device Nasal Cannula  O2 Flow Rate (L/min) 2 L/min  Assess: MEWS Score  MEWS Temp 0  MEWS Systolic 2  MEWS Pulse 2  MEWS RR 3  MEWS LOC 0  MEWS Score 7  MEWS Score Color Red  Assess: if the MEWS score is Yellow or Red  Were vital signs accurate and taken at a resting state? Yes  Does the patient meet 2 or more of the SIRS criteria? Yes  Does the patient have a confirmed or suspected source of infection? No  MEWS guidelines implemented  Yes, red  Treat  MEWS Interventions Considered administering scheduled or prn medications/treatments as ordered  Take Vital Signs  Increase Vital Sign Frequency  Red: Q1hr x2, continue Q4hrs until patient remains green for 12hrs  Escalate  MEWS: Escalate Red: Discuss with charge nurse and notify provider. Consider notifying RRT. If remains red for 2 hours consider need for higher level of care  Notify: Charge Nurse/RN  Name of Charge Nurse/RN Notified Tomeka RN  Provider Notification  Provider Name/Title Dr. Drusilla  Date Provider Notified Oct 30, 2023  Time Provider Notified 1005  Method of Notification Page  Notification Reason Change in status  Provider response At bedside;See new orders  Date of Provider Response 10-30-2023  Time of Provider Response 1020  Assess: SIRS CRITERIA  SIRS Temperature  0  SIRS Respirations  1  SIRS Pulse 1  SIRS WBC 0  SIRS Score Sum  2

## 2023-10-11 NOTE — Progress Notes (Signed)
   10/23/2023 1454  Vitals  BP 117/85  MAP (mmHg) 96  ECG Heart Rate (!) 126  Resp (!) 47  MEWS COLOR  MEWS Score Color Red  Oxygen Therapy  SpO2 100 %  O2 Device Non-rebreather Mask  MEWS Score  MEWS Temp 0  MEWS Systolic 0  MEWS Pulse 2  MEWS RR 3  MEWS LOC 0  MEWS Score 5    Upon assessment pt appears with increased drowsiness, will respond to voice, oriented x 4. NRB placed as pt Sp02 dropped to 85% on 4L. HR and RR unchanged after 250 ml bolus completed. Dr Drusilla paged en route to bedside, RT and Rapid Response nurse at bedside.   1516- Metoprolol  IV admin.   1534- CCMD and cardio at bedside.

## 2023-10-11 NOTE — Progress Notes (Signed)
   10/07/23 1235  Vitals  BP 101/75  MAP (mmHg) 85  Pulse Rate 90  ECG Heart Rate (!) 126  Resp (!) 46  MEWS COLOR  MEWS Score Color Red  Oxygen Therapy  SpO2 96 %  O2 Device Nasal Cannula  O2 Flow Rate (L/min) 2 L/min  MEWS Score  MEWS Temp 0  MEWS Systolic 0  MEWS Pulse 2  MEWS RR 3  MEWS LOC 0  MEWS Score 5  Provider Notification  Provider Name/Title Dr. Drusilla  Date Provider Notified 2023/10/07  Time Provider Notified 1240  Method of Notification Page  Notification Reason Change in status  Provider response Other (Comment) (states he will consult cardiology again)  Date of Provider Response 2023-10-07  Time of Provider Response 1255

## 2023-10-11 NOTE — Progress Notes (Signed)
  Progress Note  Patient Name: Donna Sexton Date of Encounter: 2023-10-08 [facility] Cardiologist: Peter [place], MD   Interval Summary   Called to see patient for hypotension. Concern about presence of pericardial effusion now with fairly profound hypotension. Pt with diffuse pain, on BiPap, unable to obtain meaningful history. Son and husband at bedside.   Vital Signs Vitals:   10-08-2023 1600 10-08-2023 1611 2023-10-08 1623 10/08/2023 1636  BP: (!) 75/59 (!) 78/65 (!) 76/53 (!) 62/33  Pulse: 75 90 91 (!) 117  Resp: (!) 28 (!) 38 (!) 37 (!) 52  Temp:      TempSrc:      SpO2: 100% 93% 99% 96%  Weight:        Intake/Output Summary (Last 24 hours) at 08-Oct-2023 1639 Last data filed at 08-Oct-2023 0900 Gross per 24 hour  Intake 0 ml  Output 250 ml  Net -250 ml      10/02/2023    2:43 PM 09/25/2023   11:41 AM 09/12/2023    5:00 AM  Last 3 Weights  Weight (lbs) 235 lb 14.3 oz 233 lb 14.5 oz 231 lb 4.2 oz  Weight (kg) 107 kg 106.1 kg 104.9 kg      Telemetry/ECG  Atrial fibrillation, HR 100-120 bpm - Personally Reviewed  Physical Exam  GEN: No acute distress.   Neck: No JVD Cardiac: irregularly irregular, no murmurs, rubs, or gallops.  Respiratory: Clear to auscultation bilaterally. GI: Soft, nontender, non-distended  MS: Diffuse edema  Assessment & Plan  Pericardial effusion with cardiac tamponade - I performed bedside echo which confirms moderate-large pericardial effusion and bilateral pleural effusions. Suspect this is the primary cause of her hypotension.  Persistent atrial fibrillation - stop anticoagulation in setting of increasing effusion. Unable to rate control in setting of hypotension. Chronic diastolic HF Goals of care - long discussion with patient's son and husband. CCM team and [facility] (Dr Drusilla) present and we all participated in discussion. I reviewed risks, indications of pericardiocentesis and we discussed the role of invasive interventions  (pericardiocentesis/thoracentesis) in light of her overall medical problems and previously expressed wishes. Through a shared decision making conversation, they have decided on comfort care and do not want her to have any further invasive interventions. This is in line with her expressed wishes and we are all in agreement. Cardiology will sign off. Please call if we can be of any further assistance. Thanks    For questions or updates, please contact [facility] Please consult [email or web address] for contact info under         Signed, Ozell Fell, MD

## 2023-10-11 NOTE — Plan of Care (Signed)
°  Problem: Activity: °Goal: Ability to tolerate increased activity will improve °Outcome: Progressing °  °Problem: Respiratory: °Goal: Ability to maintain adequate ventilation will improve °Outcome: Progressing °Goal: Ability to maintain a clear airway will improve °Outcome: Progressing °  °

## 2023-10-11 NOTE — Consult Note (Addendum)
 NAME:  Donna Sexton, MRN:  [id number], DOB:  October 21, 1950, LOS: 11 ADMISSION DATE:  09/23/2023, CONSULTATION DATE:  Oct 15, 2023 REFERRING MD:  Drusilla, CHIEF COMPLAINT:  SOB   History of Present Illness:  73 yo F PMH DNR/I status CHF Bipolar disorder who was admitted to [facility] 09/23/23 w AMS, felt polypharmacy and dehydration.  On 10/15/23 the patient decompensated, was hypotensive, improved transiently w fluids. A CXR was obtained which revealed large L pleural effusion.  PCCM was consulted for urgent/emergent thora evaluation in context of declining respiratory status on BiPAP   Pertinent  Medical History  Bipolar CHF DNR  Significant Hospital Events: Including procedures, antibiotic start and stop dates in addition to other pertinent events   9/13 admit 9/22 ECHO w circumferential pericardial effusion 2023-10-15 hypotension. Declining resp status. PCCM consult for urgent thora.   Interim History / Subjective:  Narrowing pulse pressure  On BiPAP for WOB  Objective    Blood pressure (!) 62/33, pulse (!) 117, temperature 98.2 F (36.8 C), temperature source Oral, resp. rate (!) 52, weight 107 kg, SpO2 96%.    Vent Mode: BIPAP FiO2 (%):  [21 %-100 %] 60 % Set Rate:  [18 bmp] 18 bmp PEEP:  [6 cmH20] 6 cmH20 Pressure Support:  [10 cmH20] 10 cmH20   Intake/Output Summary (Last 24 hours) at 10/15/2023 1647 Last data filed at 10-15-2023 0900 Gross per 24 hour  Intake 0 ml  Output 250 ml  Net -250 ml   Filed Weights   09/25/23 1141 10/02/23 1443  Weight: 106.1 kg 107 kg    Examination: General: Chronically and critically ill appearing F  HENT: [place]. BiPAP in place  Lungs: Incr RR, Diminished L sided sounds Cardiovascular: irregular. Extremities are cool and cap refill is sluggish    Abdomen: obese soft  Extremities: Edematous BUE BLE  Neuro: awakens to voice intermittently. Awakens to noxious stimuli consistently. Very lethargic  GU: defer  Resolved problem list   Assessment and  Plan   Shock -- concern for obstructive shock Circumferential pericardial effusion  -new hypotension, initially responsive to IVF with narrowing pulse pressure, known circumferential pericardial effusion 2 days ago  P -I can't confidently get get views on POCUS to assess tamponade physiology but I am very concerned that this is a contributor to current picture -I feel strongly that she needs urgent cardiology evaluation of her pericardial effusion and have strongly urged re-engagement and urgent evaluation  -in interim I have called one of my attendings for second POCUS look -as detailed below, POA is contemplating scope of care (invasive procedures, pressors, ICU txf)  -I do think that if pericardiocentesis is pursued, she would almost certainly need pressors CVC art line.  -I have asked for CBC CMP BNP LA   Acute respiratory failure w hypoxia Large L pleural effusion P -I think that thora would likely catalyze further decompensation at this juncture and the primary focus needs to be determining scope of care related to her shock state   Acute encephalopathy  -minimize cns depressing meds -as above BMP -as above trying to est scope of care for BP support   AKI w anuria -CMP is pending but in d/w RN, no UOP 2023/10/15 and bladder scan only 40cc   Afib on eliquis  Chronic AC -no acute intervention; but does incr procedural risks   Goals of care  DNR/I -spoke w POA Lorene. He is unsure if she would want measures like pressors, CVC, arterial line, thora, chest tube, pericardiocentesis  -  It sounds like she has been declining for some time, and I think it would also be appropriate to consider either limited scope of interventions or transition to comfort care. Will d/w family again when son arrives    Labs   CBC: Recent Labs  Lab 09/29/23 0536 09/30/23 0715 10/01/23 0434 10/02/23 0604 2023-10-06 1613  WBC 6.3 7.4 8.3 9.3 19.7*  NEUTROABS  --   --   --   --  14.7*  HGB 10.5* 10.1* 9.8*  10.3* 12.0  HCT 31.1* 29.0* 28.1* 30.4* 35.4*  MCV 98.4 96.7 95.9 97.4 97.8  PLT 179 209 204 267 597*    Basic Metabolic Panel: Recent Labs  Lab 09/28/23 0551 09/29/23 0536 09/30/23 0715 10/01/23 0434 10/02/23 0604 06-Oct-2023 1423  NA 134* 131* 134* 133* 131* 128*  K 3.5 3.7 3.5 3.7 4.7 4.9  CL 97* 100 98 97* 96* 94*  CO2 25 26 26 29 24  18*  GLUCOSE 90 96 93 107* 93 138*  BUN 13 10 8  5* 6* 16  CREATININE 0.50 0.52 0.50 0.69 0.50 1.30*  CALCIUM  9.0 8.7* 8.8* 8.9 9.0 8.9  MG 1.8 1.6* 1.7 1.6* 1.8  --    GFR: Estimated Creatinine Clearance: 45.3 mL/min (A) (by C-G formula based on SCr of 1.3 mg/dL (H)). Recent Labs  Lab 09/30/23 0715 10/01/23 0434 10/02/23 0604 06-Oct-2023 1613  WBC 7.4 8.3 9.3 19.7*  LATICACIDVEN  --   --   --  3.8*    Liver Function Tests: No results for input(s): AST, ALT, ALKPHOS, BILITOT, PROT, ALBUMIN in the last 168 hours. No results for input(s): LIPASE, AMYLASE in the last 168 hours. No results for input(s): AMMONIA in the last 168 hours.  ABG    Component Value Date/Time   PHART 7.49 (H) 10-06-2023 1512   PCO2ART 28 (L) 2023/10/06 1512   PO2ART 308 (H) October 06, 2023 1512   HCO3 21.3 10/06/23 1512   TCO2 27 09/24/2023 0103   ACIDBASEDEF 0.9 Oct 06, 2023 1512   O2SAT 100 10-06-23 1512     Coagulation Profile: No results for input(s): INR, PROTIME in the last 168 hours.  Cardiac Enzymes: No results for input(s): CKTOTAL, CKMB, CKMBINDEX, TROPONINI in the last 168 hours.  HbA1C: Hemoglobin A1C  Date/Time Value Ref Range Status  02/16/2023 10:14 AM 6.2 (A) 4.0 - 5.6 % Final  08/05/2022 01:53 PM 5.3 4.0 - 5.6 % Final   Hgb A1c MFr Bld  Date/Time Value Ref Range Status  04/25/2022 08:07 AM 5.7 (H) <5.7 % of total Hgb Final    Comment:    For someone without known diabetes, a hemoglobin  A1c value between 5.7% and 6.4% is consistent with prediabetes and should be confirmed with a  follow-up test. . For  someone with known diabetes, a value <7% indicates that their diabetes is well controlled. A1c targets should be individualized based on duration of diabetes, age, comorbid conditions, and other considerations. . This assay result is consistent with an increased risk of diabetes. . Currently, no consensus exists regarding use of hemoglobin A1c for diagnosis of diabetes for children. SABRA   09/16/2020 09:11 AM 5.3 <5.7 % of total Hgb Final    Comment:    For the purpose of screening for the presence of diabetes: . <5.7%       Consistent with the absence of diabetes 5.7-6.4%    Consistent with increased risk for diabetes             (prediabetes) >  or =6.5%  Consistent with diabetes . This assay result is consistent with a decreased risk of diabetes. . Currently, no consensus exists regarding use of hemoglobin A1c for diagnosis of diabetes in children. . According to American Diabetes Association (ADA) guidelines, hemoglobin A1c <7.0% represents optimal control in non-pregnant diabetic patients. Different metrics may apply to specific patient populations.  Standards of Medical Care in Diabetes(ADA). .     CBG: Recent Labs  Lab 10-29-23 1501  GLUCAP 150*    Review of Systems:   Unable to obtain due to encephalopathy   Past Medical History:  She,  has a past medical history of Acute hypoxemic respiratory failure due to COVID-19 [facility]) (03/01/2023), Allergy (Morphine), ANEMIA (11/12/2007), Anxiety, Arthritis, ASTHMA NOS W/ACUTE EXACERBATION (01/13/2010), BIPOLAR DISORDER UNSPECIFIED (10/29/2007), CHF (congestive heart failure) (HCC), COMMON MIGRAINE (12/29/2008), Encephalopathy due to infection (04/13/2023), GERD (07/28/2009), HYPERLIPIDEMIA (10/29/2007), HYPERTENSION, BENIGN ESSENTIAL (10/29/2007), Orbital mass, 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    Surgical History:   Past Surgical History:  Procedure Laterality Date   ABDOMINAL HYSTERECTOMY     BREAST IMPLANT REMOVAL Bilateral 2022   BREAST SURGERY  01/10/1978   implants bilateral   CARPAL TUNNEL RELEASE     CHOLECYSTECTOMY     COSMETIC SURGERY     EYE SURGERY  cataract both eyes   HERNIA REPAIR     JOINT REPLACEMENT     PAROTID ENDOSCOPY     SPINE SURGERY  lower back   TUBAL LIGATION       Social History:   reports that she has never smoked. She has never used smokeless tobacco. She reports that she does not drink alcohol  and does not use drugs.   Family History:  Her family history includes Alcohol  abuse in her maternal uncle and maternal uncle; Anxiety disorder in her mother; Arthritis in her father; Diabetes in her father; Heart attack in her father and mother; Heart failure in her brother and mother; Hypertension in her father and mother. There is no history of Breast cancer.   Allergies Allergies  Allergen Reactions   Morphine Nausea And Vomiting     Home Medications  Prior to Admission medications   Medication Sig Start Date End Date Taking? Authorizing Provider  amiodarone  (PACERONE ) 100 MG tablet Take 100 mg by mouth daily.   Yes [provider]  apixaban  (ELIQUIS ) 5 MG TABS tablet Take 1 tablet (5 mg total) by mouth 2 (two) times daily. 06/20/23  Yes Alvan Dorothyann BIRCH, MD  atorvastatin  (LIPITOR) 40 MG tablet Take 1 tablet (40 mg total) by mouth daily. 05/22/23  Yes Alvan Dorothyann BIRCH, MD  bisacodyl  (DULCOLAX) 10 MG suppository Place 10 mg rectally daily as needed for moderate constipation.   Yes [provider]  diltiazem  (CARDIZEM  CD) 120 MG 24 hr capsule Take 120 mg by mouth daily.   Yes [provider]  divalproex  (DEPAKOTE  ER) 500 MG 24 hr tablet Take 3 tablets (1,500 mg total) by mouth at bedtime. 09/01/22  Yes Cottle, Lorene KANDICE Raddle., MD  famotidine  (PEPCID ) 20 MG tablet Take 1 tablet (20 mg total) by mouth daily. 09/12/23   Yes Raenelle Coria, MD  fluticasone  furoate-vilanterol (BREO ELLIPTA ) 100-25 MCG/ACT AEPB Inhale 1 puff into the lungs every morning.   Yes [provider]  furosemide  (LASIX ) 40 MG tablet Take 1 tablet (40 mg total) by mouth daily. Patient taking differently: Take 80 mg by mouth daily. 05/17/23  Yes Rana Dixon L, NP  HYDROcodone -acetaminophen  (NORCO/VICODIN) 5-325 MG tablet Take 1 tablet by mouth every 6 (six) hours as needed for moderate pain (pain score 4-6). 09/12/23  Yes Raenelle Coria, MD  levalbuterol  (XOPENEX ) 0.63 MG/3ML nebulizer solution Take 3 mLs (0.63 mg total) by nebulization every 6 (six) hours as needed for wheezing or shortness of breath. 03/10/23  Yes Rai, Ripudeep K, MD  metoprolol  tartrate (LOPRESSOR ) 25 MG tablet Take 1 tablet (25 mg total) by mouth 2 (two) times daily. 07/07/23 10/05/23 Yes Terra Fairy PARAS, PA-C  mirabegron  ER (MYRBETRIQ ) 25 MG TB24 tablet Take 25 mg by mouth at bedtime.   Yes [provider]  nitrofurantoin , macrocrystal-monohydrate, (MACROBID ) 100 MG capsule Take 1 capsule (100 mg total) by mouth at bedtime. 09/12/23  Yes Raenelle Coria, MD  ondansetron  (ZOFRAN ) 4 MG tablet Take 1 tablet (4 mg total) by mouth every 6 (six) hours as needed for nausea. 07/27/23  Yes Sebastian Toribio GAILS, MD  polyethylene glycol (MIRALAX  / GLYCOLAX ) 17 g packet Take 17 g by mouth daily as needed for moderate constipation. Patient taking differently: Take 17 g by mouth daily. 04/19/23  Yes Dahal, Chapman, MD  potassium chloride  SA (KLOR-CON  M) 20 MEQ tablet Take 1 tablet (20 mEq total) by mouth daily. 06/28/23  Yes Alvan Dorothyann BIRCH, MD  pregabalin  (LYRICA ) 50 MG capsule Take 50 mg by mouth 2 (two) times daily.   Yes [provider]  senna-docusate (SENOKOT-S) 8.6-50 MG tablet Take 2 tablets by mouth at bedtime. 03/10/23  Yes Rai, Ripudeep K, MD  acetaminophen  (TYLENOL ) 500 MG tablet Take 1 tablet (500 mg total) by mouth every 6 (six) hours as needed for mild  pain (pain score 1-3) (or Fever >/= 101). Patient not taking: Reported on 09/24/2023 03/10/23   Rai, Nydia POUR, MD  mometasone -formoterol  (DULERA ) 100-5 MCG/ACT AERO Inhale 2 puffs into the lungs 2 (two) times daily. Patient not taking: Reported on 09/24/2023 03/10/23   Rai, Nydia POUR, MD  traZODone  (DESYREL ) 50 MG tablet Take 50 mg by mouth at bedtime as needed for sleep.    [provider]     Critical care time: 70 minutes        CRITICAL CARE Performed by: Ronnald FORBES Gave   Total critical care time: 70 minutes  Critical care time was exclusive of separately billable procedures and treating other patients. Critical care was necessary to treat or prevent imminent or life-threatening deterioration.  Critical care was time spent personally by me on the following activities: development of treatment plan with patient and/or surrogate as well as nursing, discussions with consultants, evaluation of patient's response to treatment, examination of patient, obtaining history from patient or surrogate, ordering and performing treatments and interventions, ordering and review of laboratory studies, ordering and review of radiographic studies, pulse oximetry and re-evaluation of patient's condition.    Ronnald Gave MSN, AGACNP-BC [facility] Pulmonary/Critical Care Medicine [phone] Amion fpr pager October 22, 2023, 4:47 PM

## 2023-10-11 NOTE — Progress Notes (Signed)
    Patient Name: Donna Sexton           DOB: [birth year]  MRN: [id number]       OVERNIGHT EVENT    Notified by RN that patient has expired at 1955.  2 RN verified. Patient was comfort care.   Family has been notified by RN.    Aundre Hietala, DNP, ACNPC- AG [facility] [place]

## 2023-10-11 NOTE — TOC Progression Note (Signed)
 Transition of Care [facility]) - Progression Note    Patient Details  Name: Donna Sexton MRN: [id number] Date of Birth: February 02, 1950  [facility] of [facility]) CM/SW Contact  Montie LOISE Louder, [place] Phone Number: 10-30-2023, 3:50 PM  Clinical Narrative:     Insurance approved 9/23-9/25 reference # [id number]     Expected Discharge Plan: Skilled Nursing Facility Barriers to Discharge: Continued Medical Work up               Expected Discharge Plan and Services In-house Referral: Clinical Social Work     Living arrangements for the past 2 months: Skilled Nursing Facility                                       Social Drivers of Health (SDOH) Interventions SDOH Screenings   Food Insecurity: Patient Unable To Answer (09/24/2023)  Recent Concern: Food Insecurity - Food Insecurity Present (06/26/2023)  Housing: Unknown (09/24/2023)  Transportation Needs: Patient Unable To Answer (09/24/2023)  Utilities: Patient Unable To Answer (09/24/2023)  Alcohol  Screen: Low Risk  (03/28/2022)  Depression (PHQ2-9): Medium Risk (06/26/2023)  Financial Resource Strain: Low Risk  (06/26/2023)  Physical Activity: Inactive (06/26/2023)  Social Connections: Unknown (09/24/2023)  Recent Concern: Social Connections - Socially Isolated (09/05/2023)  Stress: Stress Concern Present (06/26/2023)  Tobacco Use: Low Risk  (09/23/2023)    Readmission Risk Interventions    09/09/2023   12:46 PM  Readmission Risk Prevention Plan  Transportation Screening Complete  PCP or Specialist Appt within 3-5 Days Complete  HRI or Home Care Consult Complete  Social Work Consult for Recovery Care Planning/Counseling Complete  Palliative Care Screening Not Applicable  Medication Review Oceanographer) Complete

## 2023-10-11 NOTE — Progress Notes (Signed)
 RN called into room per telemetry for safety check.  Pt has expired.  RN will notify the family when they return from dinner.

## 2023-10-11 DEATH — deceased

## 2023-10-12 ENCOUNTER — Telehealth: Payer: Self-pay | Admitting: Psychiatry

## 2023-10-12 NOTE — Telephone Encounter (Signed)
 Patients ex-husband called to inform Dr Geoffry that Verneita passed on 10-18-23

## 2023-10-12 NOTE — Telephone Encounter (Signed)
 FYI:  Patients ex-husband called to inform Dr Geoffry that Donna Sexton passed on 2023-10-27

## 2023-10-23 ENCOUNTER — Telehealth: Admitting: Psychiatry

## 2023-11-06 ENCOUNTER — Other Ambulatory Visit (HOSPITAL_COMMUNITY)

## 2023-11-10 ENCOUNTER — Ambulatory Visit: Admitting: Emergency Medicine

## 2023-11-11 NOTE — Discharge Summary (Signed)
 Death Summary  KARLE DESROSIER FMW:[id number] DOB: Sep 05, 1950 DOA: 10-18-2023  PCP: Alvan Dorothyann BIRCH, MD PCP/Office notified:   Admit date: 2023-10-18 Date of Death: 11-15-23  Final Diagnoses:  Principal Problem:   Acute respiratory failure with hypoxia (HCC) Active Problems:   Bipolar disorder (HCC)   Hyperlipidemia   Essential hypertension   Chronic heart failure with preserved ejection fraction (HCC)   Atrial fibrillation with rapid ventricular response (HCC)   CAP (community acquired pneumonia)   Constipation   Class 3 obesity (HCC)   Acute metabolic encephalopathy   Pericardial effusion   DNR (do not resuscitate)   Palliative care by specialist   Mild protein-calorie malnutrition      History of present illness:  73 y.o. female with medical history significant of bipolar disorder anemia and anxiety asthma GERD, IBS, HL,D HTN, osteopenia overactive bladder, sleep apnea, prediabetes, recurrent UTIs ,paroxysmal atrial fibrillation ,chronic diastolic CHF, Presented with nausea, vomiting and altered mentation.   Hospital Course:   Hypotension/?  Cardiogenic shock - Patient became hypotensive on 10-29-2023 with heart rate in 130s.  She was supposed to get amiodarone , metoprolol  and Cardizem  which was held due to low blood pressure.  Blood pressure improved temporarily after she received LR to 50 cc bolus x 1.  Patient continues to be hypotensive. - Patient has moderate pericardial effusion, cardiology was consulted with concern for cardiac tamponade. - Patient's son and her ex-husband at bedside, had discussion with cardiology regarding putting pericardial drain. - Palliative care also consulted for goals of care discussion. - family decided to pursue comfort measures and avoid any aggressive interventions.       Acute metabolic encephalopathy, POA:  Most likely multifactorial secondary to dehydration, polypharmacy and possible PNA. CT head done in ED didn't show any acute  abnormalities    Acute hypoxemic respiratory failure -Patient's oxygen saturations dropped after she received IV fluid bolus.  Stat x-ray showed worsening of left pleural effusion.  PCCM was consulted for urgent thoracentesis however PCCM was reluctant to do thoracentesis given that patient was hypotensive with moderate-sized pericardial effusion.  Cardiology was consulted for urgent pericardiocentesis.  Earlier patient was treated for possible pneumonia.  She was treated with IV cefepime .     Large left pleural effusion Stat chest x-ray obtained today showed large left pleural effusion.  PCCM was consulted for urgent thoracentesis.  They were electing to do thoracentesis considering large pericardial effusion with hypotension and possibility of cardiogenic shock. At this time patient is comfort measures only.  No aggressive interventions desired per family.   Goals of care discussion Palliative care was consulted for goals of care discussion. -Due to poor prognosis family decided for comfort measures -Patient expired at 1955        Time: Time of death 1954/11/28  Signed:  Sabas GORMAN Brod  [facility] 11-15-23, 5:24 PM

## 2023-12-22 ENCOUNTER — Ambulatory Visit: Admitting: Nurse Practitioner

## 2023-12-22 ENCOUNTER — Ambulatory Visit (HOSPITAL_COMMUNITY): Admitting: Internal Medicine

## 2023-12-25 ENCOUNTER — Ambulatory Visit: Admitting: Cardiology
